# Patient Record
Sex: Female | Born: 1945 | Race: White | Hispanic: No | Marital: Married | Smoking: Current every day smoker
Health system: Southern US, Community
[De-identification: ages and names within clinical notes are randomized; demographics above are authoritative.]

## PROBLEM LIST (undated history)

## (undated) DIAGNOSIS — J449 Chronic obstructive pulmonary disease, unspecified: Secondary | ICD-10-CM

## (undated) DIAGNOSIS — I10 Essential (primary) hypertension: Secondary | ICD-10-CM

## (undated) DIAGNOSIS — G709 Myoneural disorder, unspecified: Secondary | ICD-10-CM

## (undated) DIAGNOSIS — F419 Anxiety disorder, unspecified: Secondary | ICD-10-CM

## (undated) DIAGNOSIS — I509 Heart failure, unspecified: Secondary | ICD-10-CM

## (undated) DIAGNOSIS — E119 Type 2 diabetes mellitus without complications: Secondary | ICD-10-CM

## (undated) DIAGNOSIS — M1990 Unspecified osteoarthritis, unspecified site: Secondary | ICD-10-CM

## (undated) DIAGNOSIS — N189 Chronic kidney disease, unspecified: Secondary | ICD-10-CM

## (undated) DIAGNOSIS — E785 Hyperlipidemia, unspecified: Secondary | ICD-10-CM

## (undated) HISTORY — DX: Anxiety disorder, unspecified: F41.9

## (undated) HISTORY — DX: Chronic obstructive pulmonary disease, unspecified: J44.9

## (undated) HISTORY — PX: ANKLE SURGERY: SHX546

## (undated) HISTORY — DX: Myoneural disorder, unspecified: G70.9

## (undated) HISTORY — DX: Chronic kidney disease, unspecified: N18.9

## (undated) HISTORY — DX: Essential (primary) hypertension: I10

## (undated) HISTORY — DX: Heart failure, unspecified: I50.9

## (undated) HISTORY — DX: Hyperlipidemia, unspecified: E78.5

## (undated) HISTORY — DX: Unspecified osteoarthritis, unspecified site: M19.90

## (undated) HISTORY — DX: Type 2 diabetes mellitus without complications: E11.9

---

## 2004-07-26 HISTORY — PX: JOINT REPLACEMENT: SHX530

## 2004-10-22 ENCOUNTER — Encounter: Payer: Self-pay | Admitting: Orthopedic Surgery

## 2004-10-24 ENCOUNTER — Encounter: Payer: Self-pay | Admitting: Orthopedic Surgery

## 2005-10-09 ENCOUNTER — Other Ambulatory Visit: Payer: Self-pay

## 2005-10-09 ENCOUNTER — Emergency Department: Payer: Self-pay | Admitting: Emergency Medicine

## 2005-10-15 ENCOUNTER — Emergency Department: Payer: Self-pay | Admitting: Emergency Medicine

## 2007-02-07 ENCOUNTER — Ambulatory Visit: Payer: Self-pay | Admitting: Internal Medicine

## 2007-02-07 DIAGNOSIS — Z8679 Personal history of other diseases of the circulatory system: Secondary | ICD-10-CM | POA: Insufficient documentation

## 2009-07-14 ENCOUNTER — Ambulatory Visit: Payer: Self-pay | Admitting: Unknown Physician Specialty

## 2009-08-01 ENCOUNTER — Ambulatory Visit: Payer: Self-pay | Admitting: Unknown Physician Specialty

## 2011-06-20 ENCOUNTER — Ambulatory Visit: Payer: Self-pay

## 2011-06-22 DIAGNOSIS — Z8619 Personal history of other infectious and parasitic diseases: Secondary | ICD-10-CM | POA: Insufficient documentation

## 2012-09-15 ENCOUNTER — Ambulatory Visit: Payer: Self-pay | Admitting: Family Medicine

## 2013-07-25 ENCOUNTER — Ambulatory Visit: Payer: Self-pay | Admitting: Ophthalmology

## 2013-11-15 ENCOUNTER — Ambulatory Visit: Payer: Self-pay | Admitting: Family Medicine

## 2014-11-14 LAB — HEPATIC FUNCTION PANEL
ALT: 10 U/L (ref 7–35)
AST: 16 U/L (ref 13–35)

## 2014-11-14 LAB — BASIC METABOLIC PANEL
BUN: 21 mg/dL (ref 4–21)
CREATININE: 17 mg/dL — AB (ref 0.5–1.1)
Glucose: 126 mg/dL
POTASSIUM: 4.4 mmol/L (ref 3.4–5.3)
SODIUM: 143 mmol/L (ref 137–147)

## 2014-11-14 LAB — HEMOGLOBIN A1C: Hgb A1c MFr Bld: 6.4 % — AB (ref 4.0–6.0)

## 2015-01-02 ENCOUNTER — Other Ambulatory Visit: Payer: Self-pay

## 2015-01-02 MED ORDER — PRAVASTATIN SODIUM 10 MG PO TABS: 10.0000 mg | ORAL_TABLET | Freq: Every day | ORAL | Status: DC

## 2015-01-02 NOTE — Telephone Encounter (Signed)
This was sent from [facility] as a refill request.

## 2015-01-10 ENCOUNTER — Encounter: Payer: Self-pay | Admitting: Family Medicine

## 2015-01-10 ENCOUNTER — Ambulatory Visit (INDEPENDENT_AMBULATORY_CARE_PROVIDER_SITE_OTHER): Payer: Medicare Other | Admitting: Family Medicine

## 2015-01-10 DIAGNOSIS — N183 Chronic kidney disease, stage 3 unspecified: Secondary | ICD-10-CM | POA: Insufficient documentation

## 2015-01-10 DIAGNOSIS — R748 Abnormal levels of other serum enzymes: Secondary | ICD-10-CM | POA: Insufficient documentation

## 2015-01-10 DIAGNOSIS — Z9181 History of falling: Secondary | ICD-10-CM | POA: Insufficient documentation

## 2015-01-10 DIAGNOSIS — E1142 Type 2 diabetes mellitus with diabetic polyneuropathy: Secondary | ICD-10-CM | POA: Insufficient documentation

## 2015-01-10 DIAGNOSIS — G8929 Other chronic pain: Secondary | ICD-10-CM | POA: Diagnosis not present

## 2015-01-10 DIAGNOSIS — J028 Acute pharyngitis due to other specified organisms: Secondary | ICD-10-CM

## 2015-01-10 DIAGNOSIS — M48061 Spinal stenosis, lumbar region without neurogenic claudication: Secondary | ICD-10-CM | POA: Insufficient documentation

## 2015-01-10 DIAGNOSIS — M25552 Pain in left hip: Secondary | ICD-10-CM

## 2015-01-10 DIAGNOSIS — F419 Anxiety disorder, unspecified: Secondary | ICD-10-CM | POA: Diagnosis not present

## 2015-01-10 DIAGNOSIS — M545 Low back pain, unspecified: Secondary | ICD-10-CM | POA: Insufficient documentation

## 2015-01-10 DIAGNOSIS — E669 Obesity, unspecified: Secondary | ICD-10-CM

## 2015-01-10 DIAGNOSIS — J029 Acute pharyngitis, unspecified: Secondary | ICD-10-CM

## 2015-01-10 DIAGNOSIS — R0982 Postnasal drip: Secondary | ICD-10-CM | POA: Insufficient documentation

## 2015-01-10 DIAGNOSIS — E1169 Type 2 diabetes mellitus with other specified complication: Secondary | ICD-10-CM | POA: Insufficient documentation

## 2015-01-10 DIAGNOSIS — I10 Essential (primary) hypertension: Secondary | ICD-10-CM | POA: Insufficient documentation

## 2015-01-10 DIAGNOSIS — M79606 Pain in leg, unspecified: Secondary | ICD-10-CM | POA: Insufficient documentation

## 2015-01-10 DIAGNOSIS — G894 Chronic pain syndrome: Secondary | ICD-10-CM | POA: Insufficient documentation

## 2015-01-10 DIAGNOSIS — F17200 Nicotine dependence, unspecified, uncomplicated: Secondary | ICD-10-CM | POA: Insufficient documentation

## 2015-01-10 DIAGNOSIS — M5410 Radiculopathy, site unspecified: Secondary | ICD-10-CM

## 2015-01-10 DIAGNOSIS — J449 Chronic obstructive pulmonary disease, unspecified: Secondary | ICD-10-CM | POA: Insufficient documentation

## 2015-01-10 DIAGNOSIS — M5416 Radiculopathy, lumbar region: Secondary | ICD-10-CM

## 2015-01-10 DIAGNOSIS — M25559 Pain in unspecified hip: Secondary | ICD-10-CM | POA: Insufficient documentation

## 2015-01-10 DIAGNOSIS — L84 Corns and callosities: Secondary | ICD-10-CM | POA: Insufficient documentation

## 2015-01-10 DIAGNOSIS — E119 Type 2 diabetes mellitus without complications: Secondary | ICD-10-CM | POA: Insufficient documentation

## 2015-01-10 DIAGNOSIS — B9689 Other specified bacterial agents as the cause of diseases classified elsewhere: Secondary | ICD-10-CM | POA: Insufficient documentation

## 2015-01-10 DIAGNOSIS — K219 Gastro-esophageal reflux disease without esophagitis: Secondary | ICD-10-CM | POA: Insufficient documentation

## 2015-01-10 DIAGNOSIS — G4700 Insomnia, unspecified: Secondary | ICD-10-CM | POA: Insufficient documentation

## 2015-01-10 DIAGNOSIS — F1120 Opioid dependence, uncomplicated: Secondary | ICD-10-CM | POA: Insufficient documentation

## 2015-01-10 MED ORDER — OXYCODONE-ACETAMINOPHEN 10-325 MG PO TABS: 1.0000 | ORAL_TABLET | Freq: Four times a day (QID) | ORAL | Status: DC | PRN

## 2015-01-10 MED ORDER — AMOXICILLIN-POT CLAVULANATE 875-125 MG PO TABS: 1.0000 | ORAL_TABLET | Freq: Two times a day (BID) | ORAL | Status: DC

## 2015-01-10 MED ORDER — BUSPIRONE HCL 7.5 MG PO TABS: 7.5000 mg | ORAL_TABLET | Freq: Two times a day (BID) | ORAL | Status: DC

## 2015-01-10 NOTE — Progress Notes (Signed)
Name: Emily Pratt   MRN: [id number]    DOB: 07/04/1946   Date:01/10/2015       Progress Note  Subjective  Chief Complaint  Chief Complaint  Patient presents with  . Pain  . Hoarse    Back Pain This is a chronic problem. The problem is unchanged. The pain is present in the gluteal and sacro-iliac. The quality of the pain is described as aching. The pain radiates to the left thigh. The pain is at a severity of 6/10. The pain is moderate. The symptoms are aggravated by bending. Associated symptoms include leg pain. Pertinent negatives include no bladder incontinence, bowel incontinence, fever, headaches, tingling or weakness. She has tried analgesics for the symptoms. The treatment provided significant relief.  Sore Throat  This is a new problem. The current episode started more than 1 month ago. The problem has been unchanged. The pain is worse on the right side. There has been no fever. The pain is moderate. Associated symptoms include ear pain, a hoarse voice and swollen glands. Pertinent negatives include no coughing, ear discharge, headaches or plugged ear sensation. She has had no exposure to strep or mono. She has tried cool liquids (chloraseptic spray ) for the symptoms. The treatment provided moderate relief.  Anxiety Presents for follow-up visit. Symptoms include excessive worry, insomnia and muscle tension. Symptoms occur most days. The severity of symptoms is moderate.   Past treatments include benzodiazephines Emily Pratt has tried lorazepam, alprazolam, and clonazepam). The treatment provided no relief. Compliance with prior treatments has been good.      Past Medical History  Diagnosis Date  . Anxiety   . Arthritis   . COPD (chronic obstructive pulmonary disease)   . CHF (congestive heart failure)   . Hyperlipidemia   . Hypertension   . Diabetes mellitus without complication   . Chronic kidney disease   . Neuromuscular disorder     Past Surgical History  Procedure  Laterality Date  . Joint replacement Bilateral 2006    Family History  Problem Relation Age of Onset  . Diabetes Mother   . Cancer Father   . Diabetes Sister   . Stroke Sister     History   Social History  . Marital Status: Married    Spouse Name: N/A  . Number of Children: N/A  . Years of Education: N/A   Occupational History  . Not on file.   Social History Main Topics  . Smoking status: Current Every Day Smoker    Types: Cigarettes  . Smokeless tobacco: Not on file  . Alcohol Use: No  . Drug Use: No  . Sexual Activity: Not on file   Other Topics Concern  . Not on file   Social History Narrative  . No narrative on file     Current outpatient prescriptions:  .  allopurinol (ZYLOPRIM) 300 MG tablet, , Disp: , Rfl:  .  amoxicillin-clavulanate (AUGMENTIN) 875-125 MG per tablet, Take 1 tablet by mouth 2 (two) times daily., Disp: 20 tablet, Rfl: 0 .  busPIRone (BUSPAR) 7.5 MG tablet, Take 1 tablet (7.5 mg total) by mouth 2 (two) times daily., Disp: 60 tablet, Rfl: 0 .  colchicine 0.6 MG tablet, , Disp: , Rfl:  .  furosemide (LASIX) 80 MG tablet, , Disp: , Rfl:  .  JANUVIA 50 MG tablet, , Disp: , Rfl:  .  LORazepam (ATIVAN) 1 MG tablet, , Disp: , Rfl:  .  losartan (COZAAR) 100 MG tablet, , Disp: ,  Rfl:  .  LYRICA 50 MG capsule, , Disp: , Rfl:  .  metoprolol (LOPRESSOR) 100 MG tablet, , Disp: , Rfl:  .  oxyCODONE-acetaminophen (PERCOCET) 10-325 MG per tablet, Take 1 tablet by mouth every 6 (six) hours as needed for pain., Disp: 120 tablet, Rfl: 0 .  pravastatin (PRAVACHOL) 10 MG tablet, Take 1 tablet (10 mg total) by mouth daily., Disp: 30 tablet, Rfl: 2 .  sertraline (ZOLOFT) 50 MG tablet, , Disp: , Rfl:  .  SPIRIVA HANDIHALER 18 MCG inhalation capsule, , Disp: , Rfl:   Allergies  Allergen Reactions  . Atorvastatin     Does not remember why she had Intolerance to Lipitor.  . Fentanyl     itching  . Rosuvastatin Anxiety    Chest tigtness and feeling as if she  had heartburn.     Review of Systems  Constitutional: Positive for malaise/fatigue. Negative for fever and chills.  HENT: Positive for ear pain, hoarse voice and sore throat. Negative for ear discharge.   Respiratory: Negative for cough.   Gastrointestinal: Negative for bowel incontinence.  Genitourinary: Negative for bladder incontinence.  Musculoskeletal: Positive for back pain.  Neurological: Negative for tingling, weakness and headaches.  Psychiatric/Behavioral: The patient has insomnia.       Objective  Filed Vitals:   01/10/15 1538  BP: 128/80  Pulse: 86  Temp: 98.3 F (36.8 C)  Resp: 16  Height: '5\' 3"'$  (1.6 m)  Weight: 236 lb 3 oz (107.134 kg)  SpO2: 88%    Physical Exam  Constitutional: She is well-developed, well-nourished, and in no distress.  HENT:  Head: Normocephalic and atraumatic.  Right Ear: Tympanic membrane normal. No drainage or swelling.  Left Ear: No drainage, swelling or tenderness.  Mouth/Throat: Posterior oropharyngeal erythema present.  Neck: Normal range of motion. Neck supple.  Cardiovascular: Normal rate and regular rhythm.   Pulmonary/Chest: Effort normal and breath sounds normal.  Musculoskeletal:       Lumbar back: She exhibits decreased range of motion, tenderness, pain and spasm.       Back:  Lymphadenopathy:       Right cervical: No superficial cervical adenopathy present.      Left cervical: No superficial cervical adenopathy present.  Nursing note and vitals reviewed.      Recent Results (from the past 2160 hour(s))  Basic metabolic panel     Status: Abnormal   Collection Time: 11/14/14 12:00 AM  Result Value Ref Range   Glucose 126 mg/dL   BUN 21 4 - 21 mg/dL   Creatinine 17.0 (A) 0.5 - 1.1 mg/dL   Potassium 4.4 3.4 - 5.3 mmol/L   Sodium 143 137 - 147 mmol/L  Hepatic function panel     Status: None   Collection Time: 11/14/14 12:00 AM  Result Value Ref Range   ALT 10 7 - 35 U/L   AST 16 13 - 35 U/L  Hemoglobin A1c      Status: Abnormal   Collection Time: 11/14/14 12:00 AM  Result Value Ref Range   Hgb A1c MFr Bld 6.4 (A) 4.0 - 6.0 %     Assessment & Plan 1. Chronic anxiety Patient has in the past failed therapy with lorazepam, clonazepam, and alprazolam. We will start her on buspirone 7.5 mg twice a day for anxiety. Patient will follow-up in one month. - busPIRone (BUSPAR) 7.5 MG tablet; Take 1 tablet (7.5 mg total) by mouth 2 (two) times daily.  Dispense: 60 tablet; Refill: 0  2. Chronic radicular low back pain Chronic low back pain and hip pain responsive to present opioid therapy. Patient is compliant with controlled substances agreement. She is taking the medication as directed. Refills provided. - oxyCODONE-acetaminophen (PERCOCET) 10-325 MG per tablet; Take 1 tablet by mouth every 6 (six) hours as needed for pain.  Dispense: 120 tablet; Refill: 0  3. Bacterial pharyngitis Assistant upper respiratory symptoms suggestive of bacterial pharyngitis. We will start patient on 10 day course of antibiotic therapy. Patient will follow up if not improving. - amoxicillin-clavulanate (AUGMENTIN) 875-125 MG per tablet; Take 1 tablet by mouth 2 (two) times daily.  Dispense: 20 tablet; Refill: 0   There are no diagnoses linked to this encounter.  Anais Koenen [facility] 01/10/2015 7:15 PM

## 2015-02-06 ENCOUNTER — Encounter: Payer: Self-pay | Admitting: Family Medicine

## 2015-02-06 ENCOUNTER — Ambulatory Visit (INDEPENDENT_AMBULATORY_CARE_PROVIDER_SITE_OTHER): Payer: Medicare Other | Admitting: Family Medicine

## 2015-02-06 VITALS — BP 120/70 | HR 85 | Temp 98.3°F | Resp 18 | Ht 63.0 in | Wt 240.6 lb

## 2015-02-06 DIAGNOSIS — G8929 Other chronic pain: Secondary | ICD-10-CM

## 2015-02-06 DIAGNOSIS — M1A0790 Idiopathic chronic gout, unspecified ankle and foot, without tophus (tophi): Secondary | ICD-10-CM | POA: Insufficient documentation

## 2015-02-06 DIAGNOSIS — M5410 Radiculopathy, site unspecified: Secondary | ICD-10-CM

## 2015-02-06 DIAGNOSIS — F419 Anxiety disorder, unspecified: Secondary | ICD-10-CM | POA: Diagnosis not present

## 2015-02-06 DIAGNOSIS — M1A4790 Other secondary chronic gout, unspecified ankle and foot, without tophus (tophi): Secondary | ICD-10-CM

## 2015-02-06 DIAGNOSIS — I10 Essential (primary) hypertension: Secondary | ICD-10-CM | POA: Diagnosis not present

## 2015-02-06 DIAGNOSIS — M5416 Radiculopathy, lumbar region: Secondary | ICD-10-CM

## 2015-02-06 MED ORDER — OXYCODONE-ACETAMINOPHEN 10-325 MG PO TABS: 1.0000 | ORAL_TABLET | Freq: Four times a day (QID) | ORAL | Status: DC | PRN

## 2015-02-06 MED ORDER — METOPROLOL TARTRATE 100 MG PO TABS: 100.0000 mg | ORAL_TABLET | Freq: Two times a day (BID) | ORAL | Status: DC

## 2015-02-06 MED ORDER — ALLOPURINOL 300 MG PO TABS: 300.0000 mg | ORAL_TABLET | Freq: Every day | ORAL | Status: DC

## 2015-02-06 MED ORDER — LOSARTAN POTASSIUM 100 MG PO TABS: 100.0000 mg | ORAL_TABLET | Freq: Every day | ORAL | Status: DC

## 2015-02-06 MED ORDER — BUSPIRONE HCL 10 MG PO TABS: 10.0000 mg | ORAL_TABLET | Freq: Two times a day (BID) | ORAL | Status: DC

## 2015-02-06 MED ORDER — COLCHICINE 0.6 MG PO TABS: 0.6000 mg | ORAL_TABLET | Freq: Every day | ORAL | Status: DC

## 2015-02-06 NOTE — Progress Notes (Signed)
Name: Emily Pratt   MRN: [id number]    DOB: 1946/05/09   Date:02/06/2015       Progress Note  Subjective  Chief Complaint  Chief Complaint  Patient presents with  . Follow-up    4wk  . Diabetes    Back Pain This is a chronic problem. The pain is present in the gluteal and lumbar spine. The pain is at a severity of 5/10. The pain is mild. Pertinent negatives include no chest pain, headaches, leg pain, numbness or paresis. She has tried analgesics for the symptoms.  Anxiety Presents for follow-up visit. Symptoms include insomnia, irritability (stressed from her husband's poor health) and nervous/anxious behavior. Patient reports no chest pain, depressed mood, excessive worry or palpitations. The severity of symptoms is moderate. The quality of sleep is poor.    Hypertension This is a chronic problem. The problem is controlled. Associated symptoms include anxiety. Pertinent negatives include no blurred vision, chest pain, headaches or palpitations. Past treatments include angiotensin blockers and beta blockers. There is no history of kidney disease, CAD/MI or CVA.      Past Medical History  Diagnosis Date  . Anxiety   . Arthritis   . COPD (chronic obstructive pulmonary disease)   . CHF (congestive heart failure)   . Hyperlipidemia   . Hypertension   . Diabetes mellitus without complication   . Chronic kidney disease   . Neuromuscular disorder     Past Surgical History  Procedure Laterality Date  . Joint replacement Bilateral 2006    Family History  Problem Relation Age of Onset  . Diabetes Mother   . Cancer Father   . Diabetes Sister   . Stroke Sister     History   Social History  . Marital Status: Married    Spouse Name: N/A  . Number of Children: N/A  . Years of Education: N/A   Occupational History  . Not on file.   Social History Main Topics  . Smoking status: Current Every Day Smoker    Types: Cigarettes  . Smokeless tobacco: Not on file  .  Alcohol Use: No  . Drug Use: No  . Sexual Activity: Not on file   Other Topics Concern  . Not on file   Social History Narrative     Current outpatient prescriptions:  .  allopurinol (ZYLOPRIM) 300 MG tablet, , Disp: , Rfl:  .  busPIRone (BUSPAR) 7.5 MG tablet, Take 1 tablet (7.5 mg total) by mouth 2 (two) times daily., Disp: 60 tablet, Rfl: 0 .  colchicine 0.6 MG tablet, , Disp: , Rfl:  .  furosemide (LASIX) 80 MG tablet, , Disp: , Rfl:  .  JANUVIA 50 MG tablet, , Disp: , Rfl:  .  LORazepam (ATIVAN) 1 MG tablet, , Disp: , Rfl:  .  losartan (COZAAR) 100 MG tablet, , Disp: , Rfl:  .  LYRICA 50 MG capsule, , Disp: , Rfl:  .  metoprolol (LOPRESSOR) 100 MG tablet, , Disp: , Rfl:  .  oxyCODONE-acetaminophen (PERCOCET) 10-325 MG per tablet, Take 1 tablet by mouth every 6 (six) hours as needed for pain., Disp: 120 tablet, Rfl: 0 .  pravastatin (PRAVACHOL) 10 MG tablet, Take 1 tablet (10 mg total) by mouth daily., Disp: 30 tablet, Rfl: 2 .  sertraline (ZOLOFT) 50 MG tablet, , Disp: , Rfl:  .  SPIRIVA HANDIHALER 18 MCG inhalation capsule, , Disp: , Rfl:  .  amoxicillin-clavulanate (AUGMENTIN) 875-125 MG per tablet, Take 1 tablet by  mouth 2 (two) times daily. (Patient not taking: Reported on 02/06/2015), Disp: 20 tablet, Rfl: 0  Allergies  Allergen Reactions  . Atorvastatin     Does not remember why she had Intolerance to Lipitor.  . Fentanyl     itching  . Rosuvastatin Anxiety    Chest tigtness and feeling as if she had heartburn.     Review of Systems  Constitutional: Positive for irritability (stressed from her husband's poor health).  Eyes: Negative for blurred vision.  Cardiovascular: Negative for chest pain and palpitations.  Musculoskeletal: Positive for back pain.  Neurological: Negative for numbness and headaches.  Psychiatric/Behavioral: Negative for depression. The patient is nervous/anxious and has insomnia.       Objective  Filed Vitals:   02/06/15 1400  BP:  120/70  Pulse: 85  Temp: 98.3 F (36.8 C)  TempSrc: Oral  Resp: 18  Height: '5\' 3"'$  (1.6 m)  Weight: 240 lb 9.6 oz (109.135 kg)  SpO2: 92%    Physical Exam  Constitutional: She is oriented to person, place, and time and well-developed, well-nourished, and in no distress.  Cardiovascular: Normal rate and regular rhythm.   Pulmonary/Chest: Effort normal and breath sounds normal.  Musculoskeletal:       Back:  Neurological: She is alert and oriented to person, place, and time.  Psychiatric: Memory, affect and judgment normal.  Nursing note and vitals reviewed.   Assessment & Plan 1. Essential hypertension  - metoprolol (LOPRESSOR) 100 MG tablet; Take 1 tablet (100 mg total) by mouth 2 (two) times daily.  Dispense: 180 tablet; Refill: 0 - losartan (COZAAR) 100 MG tablet; Take 1 tablet (100 mg total) by mouth daily.  Dispense: 90 tablet; Refill: 0  2. Chronic radicular low back pain Pain responsive to opioid therapy. Patient compliant with controlled substances agreement. She is aware of the dependence potential of opioids. Refills provided and follow-up in one month - oxyCODONE-acetaminophen (PERCOCET) 10-325 MG per tablet; Take 1 tablet by mouth every 6 (six) hours as needed for pain.  Dispense: 120 tablet; Refill: 0  3. Chronic anxiety Symptoms of anxiety are improved on BuSpar 7.5 g twice a day. We will increase the dosage to 10 mg twice a day and follow-up in one month - busPIRone (BUSPAR) 10 MG tablet; Take 1 tablet (10 mg total) by mouth 2 (two) times daily.  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  Dispense: 90 tablet; Refill: 0 - colchicine 0.6 MG tablet; Take 1 tablet (0.6 mg total) by mouth daily.  Dispense: 90 tablet; Refill: 0    Berwyn Bigley [facility] 02/06/2015 2:12 PM

## 2015-02-14 ENCOUNTER — Telehealth: Payer: Self-pay | Admitting: Family Medicine

## 2015-02-14 NOTE — Telephone Encounter (Signed)
Was given an antibiotic a few weeks back but isn't better. Patient is requesting a stronger antibiotic and also something for her throat. She can barely cough. [facility] to [email or web address]. [phone]

## 2015-02-17 NOTE — Telephone Encounter (Signed)
Routed encounter to Dr. Manuella Ghazi for Prescription

## 2015-02-17 NOTE — Telephone Encounter (Signed)
Called patient at the number listed in her chart and her mailbox is full with no option to leave any voice message. Please have the patient contact our office and I can speak with the patient regarding her symptoms.

## 2015-02-18 NOTE — Telephone Encounter (Signed)
Have made several attempts myself as well as Dr. Manuella Ghazi to reach patient no answer mailbox is full and we are unable to leave message. If patient calls back we will address issue and get a number where she can be reached.

## 2015-03-06 ENCOUNTER — Encounter: Payer: Self-pay | Admitting: Family Medicine

## 2015-03-06 ENCOUNTER — Ambulatory Visit (INDEPENDENT_AMBULATORY_CARE_PROVIDER_SITE_OTHER): Payer: Medicare Other | Admitting: Family Medicine

## 2015-03-06 VITALS — BP 118/70 | HR 98 | Temp 98.0°F | Resp 19 | Ht 63.0 in | Wt 231.5 lb

## 2015-03-06 DIAGNOSIS — M1A4790 Other secondary chronic gout, unspecified ankle and foot, without tophus (tophi): Secondary | ICD-10-CM | POA: Diagnosis not present

## 2015-03-06 DIAGNOSIS — I10 Essential (primary) hypertension: Secondary | ICD-10-CM | POA: Diagnosis not present

## 2015-03-06 DIAGNOSIS — G8929 Other chronic pain: Secondary | ICD-10-CM | POA: Diagnosis not present

## 2015-03-06 DIAGNOSIS — E1140 Type 2 diabetes mellitus with diabetic neuropathy, unspecified: Secondary | ICD-10-CM

## 2015-03-06 DIAGNOSIS — F419 Anxiety disorder, unspecified: Secondary | ICD-10-CM | POA: Diagnosis not present

## 2015-03-06 DIAGNOSIS — E785 Hyperlipidemia, unspecified: Secondary | ICD-10-CM

## 2015-03-06 DIAGNOSIS — J42 Unspecified chronic bronchitis: Secondary | ICD-10-CM | POA: Diagnosis not present

## 2015-03-06 DIAGNOSIS — M5416 Radiculopathy, lumbar region: Secondary | ICD-10-CM

## 2015-03-06 DIAGNOSIS — J449 Chronic obstructive pulmonary disease, unspecified: Secondary | ICD-10-CM | POA: Insufficient documentation

## 2015-03-06 DIAGNOSIS — M5410 Radiculopathy, site unspecified: Secondary | ICD-10-CM

## 2015-03-06 DIAGNOSIS — N183 Chronic kidney disease, stage 3 unspecified: Secondary | ICD-10-CM

## 2015-03-06 DIAGNOSIS — E1149 Type 2 diabetes mellitus with other diabetic neurological complication: Secondary | ICD-10-CM

## 2015-03-06 LAB — POCT GLYCOSYLATED HEMOGLOBIN (HGB A1C): Hemoglobin A1C: 7

## 2015-03-06 MED ORDER — ALLOPURINOL 300 MG PO TABS: 300.0000 mg | ORAL_TABLET | Freq: Every day | ORAL | Status: AC

## 2015-03-06 MED ORDER — PRAVASTATIN SODIUM 10 MG PO TABS: 10.0000 mg | ORAL_TABLET | Freq: Every day | ORAL | Status: DC

## 2015-03-06 MED ORDER — OXYCODONE-ACETAMINOPHEN 10-325 MG PO TABS: 1.0000 | ORAL_TABLET | Freq: Four times a day (QID) | ORAL | Status: DC | PRN

## 2015-03-06 MED ORDER — JANUVIA 50 MG PO TABS: 50.0000 mg | ORAL_TABLET | Freq: Every day | ORAL | Status: AC

## 2015-03-06 MED ORDER — METOPROLOL TARTRATE 100 MG PO TABS: 100.0000 mg | ORAL_TABLET | Freq: Two times a day (BID) | ORAL | Status: AC

## 2015-03-06 MED ORDER — LYRICA 50 MG PO CAPS: 50.0000 mg | ORAL_CAPSULE | Freq: Three times a day (TID) | ORAL | Status: AC

## 2015-03-06 MED ORDER — LOSARTAN POTASSIUM 100 MG PO TABS: 100.0000 mg | ORAL_TABLET | Freq: Every day | ORAL | Status: AC

## 2015-03-06 MED ORDER — COLCHICINE 0.6 MG PO TABS
0.6000 mg | ORAL_TABLET | Freq: Every day | ORAL | Status: AC
Start: 2015-03-06 — End: ?

## 2015-03-06 NOTE — Progress Notes (Signed)
Name: Emily Pratt   MRN: [id number]    DOB: Jun 17, 1946   Date:03/06/2015       Progress Note  Subjective  Chief Complaint  Chief Complaint  Patient presents with  . Follow-up    4 wk/fasting  . Diabetes  . Anxiety    Diabetes She presents for her follow-up diabetic visit. She has type 2 diabetes mellitus. Her disease course has been stable. Hypoglycemia symptoms include nervousness/anxiousness. Pertinent negatives for hypoglycemia include no headaches. Pertinent negatives for diabetes include no blurred vision, no chest pain, no polydipsia and no polyuria. Pertinent negatives for diabetic complications include no CVA. Current diabetic treatment includes oral agent (monotherapy) (Januvia '50mg'$  daily.). She is following a diabetic diet. Her breakfast blood glucose range is generally 110-130 mg/dl. An ACE inhibitor/angiotensin II receptor blocker is being taken.  Anxiety Presents for follow-up visit. Symptoms include irritability, malaise and nervous/anxious behavior. Patient reports no chest pain, palpitations, panic or shortness of breath. The severity of symptoms is moderate. The symptoms are aggravated by family issues. The quality of sleep is fair.   Past treatments include non-benzodiazephine anxiolytics. Compliance with prior treatments has been poor.  Hyperlipidemia This is a chronic problem. The problem is uncontrolled. Recent lipid tests were reviewed and are high. Exacerbating diseases include diabetes and obesity. Pertinent negatives include no chest pain, leg pain, myalgias or shortness of breath. Current antihyperlipidemic treatment includes statins.  Hypertension This is a chronic problem. The problem is controlled. Associated symptoms include anxiety. Pertinent negatives include no blurred vision, chest pain, headaches, palpitations or shortness of breath. Past treatments include beta blockers and angiotensin blockers. The current treatment provides moderate improvement. There is  no history of kidney disease, CAD/MI or CVA.  Back Pain This is a chronic problem. The pain is present in the gluteal and lumbar spine. The pain radiates to the left thigh. The pain is at a severity of 5/10. The symptoms are aggravated by standing. Pertinent negatives include no chest pain, headaches or leg pain. She has tried analgesics (Percocet 10-'325mg'$ ) for the symptoms.      Past Medical History  Diagnosis Date  . Anxiety   . Arthritis   . COPD (chronic obstructive pulmonary disease)   . CHF (congestive heart failure)   . Hyperlipidemia   . Hypertension   . Diabetes mellitus without complication   . Chronic kidney disease   . Neuromuscular disorder     Past Surgical History  Procedure Laterality Date  . Joint replacement Bilateral 2006    Family History  Problem Relation Age of Onset  . Diabetes Mother   . Cancer Father   . Diabetes Sister   . Stroke Sister     Social History   Social History  . Marital Status: Married    Spouse Name: N/A  . Number of Children: N/A  . Years of Education: N/A   Occupational History  . Not on file.   Social History Main Topics  . Smoking status: Current Every Day Smoker    Types: Cigarettes  . Smokeless tobacco: Not on file  . Alcohol Use: No  . Drug Use: No  . Sexual Activity: Not on file   Other Topics Concern  . Not on file   Social History Narrative     Current outpatient prescriptions:  .  allopurinol (ZYLOPRIM) 300 MG tablet, Take 1 tablet (300 mg total) by mouth daily., Disp: 90 tablet, Rfl: 0 .  busPIRone (BUSPAR) 10 MG tablet, Take 1 tablet (10  mg total) by mouth 2 (two) times daily., Disp: 60 tablet, Rfl: 0 .  colchicine 0.6 MG tablet, Take 1 tablet (0.6 mg total) by mouth daily., Disp: 90 tablet, Rfl: 0 .  furosemide (LASIX) 80 MG tablet, , Disp: , Rfl:  .  JANUVIA 50 MG tablet, Take 1 tablet (50 mg total) by mouth daily., Disp: 90 tablet, Rfl: 0 .  LORazepam (ATIVAN) 1 MG tablet, , Disp: , Rfl:  .   losartan (COZAAR) 100 MG tablet, Take 1 tablet (100 mg total) by mouth daily., Disp: 90 tablet, Rfl: 0 .  LYRICA 50 MG capsule, Take 1 capsule (50 mg total) by mouth 3 (three) times daily., Disp: 90 capsule, Rfl: 2 .  metoprolol (LOPRESSOR) 100 MG tablet, Take 1 tablet (100 mg total) by mouth 2 (two) times daily., Disp: 180 tablet, Rfl: 0 .  oxyCODONE-acetaminophen (PERCOCET) 10-325 MG per tablet, Take 1 tablet by mouth every 6 (six) hours as needed for pain., Disp: 120 tablet, Rfl: 0 .  pravastatin (PRAVACHOL) 10 MG tablet, Take 1 tablet (10 mg total) by mouth daily., Disp: 90 tablet, Rfl: 0 .  sertraline (ZOLOFT) 50 MG tablet, , Disp: , Rfl:   Allergies  Allergen Reactions  . Atorvastatin     Does not remember why she had Intolerance to Lipitor.  . Fentanyl     itching  . Rosuvastatin Anxiety    Chest tigtness and feeling as if she had heartburn.     Review of Systems  Constitutional: Positive for irritability.  Eyes: Negative for blurred vision.  Respiratory: Negative for shortness of breath.   Cardiovascular: Negative for chest pain and palpitations.  Musculoskeletal: Positive for back pain. Negative for myalgias.  Neurological: Negative for headaches.  Endo/Heme/Allergies: Negative for polydipsia.  Psychiatric/Behavioral: The patient is nervous/anxious.       Objective  Filed Vitals:   03/06/15 0905  BP: 118/70  Pulse: 98  Temp: 98 F (36.7 C)  TempSrc: Oral  Resp: 19  Height: '5\' 3"'$  (1.6 m)  Weight: 231 lb 8 oz (105.008 kg)  SpO2: 90%    Physical Exam  Constitutional: She is oriented to person, place, and time and well-developed, well-nourished, and in no distress.  Cardiovascular: Normal rate and regular rhythm.   Pulmonary/Chest: She has wheezes in the right lower field and the left lower field.  Musculoskeletal:       Lumbar back: She exhibits tenderness and pain. She exhibits no swelling.       Back:  Neurological: She is alert and oriented to person,  place, and time.  Skin: Skin is warm and dry.  Psychiatric: Memory, affect and judgment normal.  Nursing note and vitals reviewed.     Recent Results (from the past 2160 hour(s))  POCT HgB A1C     Status: Abnormal   Collection Time: 03/06/15  9:17 AM  Result Value Ref Range   Hemoglobin A1C 7.0      Assessment & Plan  1. DM (diabetes mellitus), type 2 with neurological complications D5H at goal at 7%. Patient to continue on present therapy - POCT HgB A1C - JANUVIA 50 MG tablet; Take 1 tablet (50 mg total) by mouth daily.  Dispense: 90 tablet; Refill: 0 - LYRICA 50 MG capsule; Take 1 capsule (50 mg total) by mouth 3 (three) times daily.  Dispense: 90 capsule; Refill: 2 - HgB A1c  2. Essential hypertension Blood Pressure at goal on present therapy. - losartan (COZAAR) 100 MG tablet; Take 1 tablet (  100 mg total) by mouth daily.  Dispense: 90 tablet; Refill: 0 - metoprolol (LOPRESSOR) 100 MG tablet; Take 1 tablet (100 mg total) by mouth 2 (two) times daily.  Dispense: 180 tablet; Refill: 0  3. Chronic bronchitis, unspecified chronic bronchitis type   4. Chronic kidney disease (CKD), stage III (moderate) Recheck kidney function.  5. Anxiety Patient has not taken buspirone because she felt dizzy after taking the medication. She has tried and failed clonazepam, and alprazolam therapy in the past. DC buspirone and reevaluate in one month.   6. 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  Dispense: 90 tablet; Refill: 0 - colchicine 0.6 MG tablet; Take 1 tablet (0.6 mg total) by mouth daily.  Dispense: 90 tablet; Refill: 0  7. Chronic radicular low back pain Lower back and left hip pain is stable on present opioid therapy. Patient is aware of the adverse reactions and interactions of opioids. She is compliant with the controlled substances agreement. Refills provided and follow-up in one  month. - oxyCODONE-acetaminophen (PERCOCET) 10-325 MG per tablet; Take 1 tablet by mouth every 6 (six) hours as needed for pain.  Dispense: 120 tablet; Refill: 0  8. Hyperlipidemia  - pravastatin (PRAVACHOL) 10 MG tablet; Take 1 tablet (10 mg total) by mouth daily.  Dispense: 90 tablet; Refill: 0 - Comprehensive metabolic panel - Lipid Profile    Menna Abeln [facility] 03/06/2015 5:55 PM

## 2015-03-18 ENCOUNTER — Telehealth: Payer: Self-pay | Admitting: Family Medicine

## 2015-03-18 NOTE — Telephone Encounter (Signed)
Pt is requesting refill on inhaler that has steroids included to [facility] [place]. Pt states the inhaler she is currently using is not working for her.

## 2015-03-21 NOTE — Telephone Encounter (Signed)
Reviewed medication list from [facility]. Patient has been on Ventolin, which is a short acting beta agonist, and Spiriva which is an anticholinergic in the past. She has not been on any inhaled corticosteroid in the past. Please schedule patient for an appointment for evaluation of symptoms and medication management as appropriate.

## 2015-03-21 NOTE — Telephone Encounter (Signed)
Routed to Dr. Manuella Ghazi for Steroid approval

## 2015-03-21 NOTE — Telephone Encounter (Signed)
Routed to front desk to schedule appointment

## 2015-04-03 ENCOUNTER — Encounter: Payer: Self-pay | Admitting: Family Medicine

## 2015-04-03 ENCOUNTER — Ambulatory Visit (INDEPENDENT_AMBULATORY_CARE_PROVIDER_SITE_OTHER): Payer: Medicare Other | Admitting: Family Medicine

## 2015-04-03 VITALS — BP 120/74 | HR 98 | Temp 98.2°F | Resp 18 | Ht 63.0 in | Wt 228.5 lb

## 2015-04-03 DIAGNOSIS — M5410 Radiculopathy, site unspecified: Secondary | ICD-10-CM | POA: Diagnosis not present

## 2015-04-03 DIAGNOSIS — M5416 Radiculopathy, lumbar region: Principal | ICD-10-CM

## 2015-04-03 DIAGNOSIS — G8929 Other chronic pain: Secondary | ICD-10-CM

## 2015-04-03 MED ORDER — OXYCODONE-ACETAMINOPHEN 10-325 MG PO TABS: 1.0000 | ORAL_TABLET | Freq: Four times a day (QID) | ORAL | Status: DC | PRN

## 2015-04-03 NOTE — Progress Notes (Signed)
Name: Emily Pratt   MRN: [id number]    DOB: 09/19/1945   Date:04/03/2015       Progress Note  Subjective  Chief Complaint  Chief Complaint  Patient presents with  . Follow-up    4 wk  . Hyperlipidemia  . COPD  . Anxiety  . Diabetes    HPI Pt. Is here for follow up and medication refill for chronic low back and left hip pain. She is on Oxycodone-Acetaminophen 10-325 mg every 6 hours as needed. Today, her pain is rated at 6/10. No side effects from Oxycodone reported.  Past Medical History  Diagnosis Date  . Anxiety   . Arthritis   . COPD (chronic obstructive pulmonary disease)   . CHF (congestive heart failure)   . Hyperlipidemia   . Hypertension   . Diabetes mellitus without complication   . Chronic kidney disease   . Neuromuscular disorder     Past Surgical History  Procedure Laterality Date  . Joint replacement Bilateral 2006    Family History  Problem Relation Age of Onset  . Diabetes Mother   . Cancer Father   . Diabetes Sister   . Stroke Sister     Social History   Social History  . Marital Status: Married    Spouse Name: N/A  . Number of Children: N/A  . Years of Education: N/A   Occupational History  . Not on file.   Social History Main Topics  . Smoking status: Current Every Day Smoker    Types: Cigarettes  . Smokeless tobacco: Not on file  . Alcohol Use: No  . Drug Use: No  . Sexual Activity: Not on file   Other Topics Concern  . Not on file   Social History Narrative     Current outpatient prescriptions:  .  allopurinol (ZYLOPRIM) 300 MG tablet, Take 1 tablet (300 mg total) by mouth daily., Disp: 90 tablet, Rfl: 0 .  colchicine 0.6 MG tablet, Take 1 tablet (0.6 mg total) by mouth daily., Disp: 90 tablet, Rfl: 0 .  furosemide (LASIX) 80 MG tablet, , Disp: , Rfl:  .  JANUVIA 50 MG tablet, Take 1 tablet (50 mg total) by mouth daily., Disp: 90 tablet, Rfl: 0 .  LORazepam (ATIVAN) 1 MG tablet, , Disp: , Rfl:  .  losartan (COZAAR) 100  MG tablet, Take 1 tablet (100 mg total) by mouth daily., Disp: 90 tablet, Rfl: 0 .  LYRICA 50 MG capsule, Take 1 capsule (50 mg total) by mouth 3 (three) times daily., Disp: 90 capsule, Rfl: 2 .  metoprolol (LOPRESSOR) 100 MG tablet, Take 1 tablet (100 mg total) by mouth 2 (two) times daily., Disp: 180 tablet, Rfl: 0 .  oxyCODONE-acetaminophen (PERCOCET) 10-325 MG per tablet, Take 1 tablet by mouth every 6 (six) hours as needed for pain., Disp: 120 tablet, Rfl: 0 .  VENTOLIN HFA 108 (90 BASE) MCG/ACT inhaler, , Disp: , Rfl:   Allergies  Allergen Reactions  . Atorvastatin     Does not remember why she had Intolerance to Lipitor.  . Fentanyl     itching  . Rosuvastatin Anxiety    Chest tigtness and feeling as if she had heartburn.     Review of Systems  Musculoskeletal: Positive for back pain and joint pain.   Objective  Filed Vitals:   04/03/15 1012  BP: 120/74  Pulse: 98  Temp: 98.2 F (36.8 C)  TempSrc: Oral  Resp: 18  Height: '5\' 3"'$  (1.6 m)  Weight: 228 lb 8 oz (103.647 kg)  SpO2: 90%    Physical Exam  Constitutional: She is oriented to person, place, and time and well-developed, well-nourished, and in no distress.  Cardiovascular: Normal rate and regular rhythm.   Pulmonary/Chest: Effort normal and breath sounds normal.  Musculoskeletal:       Left hip: She exhibits tenderness.       Legs: Neurological: She is alert and oriented to person, place, and time.  Nursing note and vitals reviewed.  Assessment & Plan  1. Chronic radicular low back pain Patient compliant with controlled substances agreement. She is aware of the dependence potential and the interactions of opioids. Refills provided and follow-up in one month - oxyCODONE-acetaminophen (PERCOCET) 10-325 MG per tablet; Take 1 tablet by mouth every 6 (six) hours as needed for pain.  Dispense: 120 tablet; Refill: 0   Talma Aguillard [facility] 04/03/2015 10:26  AM

## 2015-04-09 ENCOUNTER — Ambulatory Visit: Payer: Medicare Other | Admitting: Family Medicine

## 2015-04-10 ENCOUNTER — Encounter: Payer: Self-pay | Admitting: Family Medicine

## 2015-04-10 ENCOUNTER — Ambulatory Visit (INDEPENDENT_AMBULATORY_CARE_PROVIDER_SITE_OTHER): Payer: Medicare Other | Admitting: Family Medicine

## 2015-04-10 VITALS — BP 122/78 | HR 105 | Temp 98.3°F | Resp 18 | Ht 63.0 in | Wt 233.5 lb

## 2015-04-10 DIAGNOSIS — E119 Type 2 diabetes mellitus without complications: Secondary | ICD-10-CM

## 2015-04-10 DIAGNOSIS — Z780 Asymptomatic menopausal state: Secondary | ICD-10-CM | POA: Insufficient documentation

## 2015-04-10 DIAGNOSIS — R600 Localized edema: Secondary | ICD-10-CM | POA: Insufficient documentation

## 2015-04-10 DIAGNOSIS — J449 Chronic obstructive pulmonary disease, unspecified: Secondary | ICD-10-CM | POA: Diagnosis not present

## 2015-04-10 DIAGNOSIS — E559 Vitamin D deficiency, unspecified: Secondary | ICD-10-CM | POA: Insufficient documentation

## 2015-04-10 DIAGNOSIS — R6889 Other general symptoms and signs: Secondary | ICD-10-CM | POA: Insufficient documentation

## 2015-04-10 DIAGNOSIS — F329 Major depressive disorder, single episode, unspecified: Secondary | ICD-10-CM | POA: Insufficient documentation

## 2015-04-10 DIAGNOSIS — R609 Edema, unspecified: Secondary | ICD-10-CM | POA: Diagnosis not present

## 2015-04-10 DIAGNOSIS — Z79891 Long term (current) use of opiate analgesic: Secondary | ICD-10-CM | POA: Insufficient documentation

## 2015-04-10 DIAGNOSIS — I509 Heart failure, unspecified: Secondary | ICD-10-CM | POA: Insufficient documentation

## 2015-04-10 DIAGNOSIS — M5440 Lumbago with sciatica, unspecified side: Secondary | ICD-10-CM | POA: Insufficient documentation

## 2015-04-10 MED ORDER — MOMETASONE FURO-FORMOTEROL FUM 100-5 MCG/ACT IN AERO: 2.0000 | INHALATION_SPRAY | Freq: Two times a day (BID) | RESPIRATORY_TRACT | Status: DC

## 2015-04-10 MED ORDER — ALBUTEROL SULFATE (2.5 MG/3ML) 0.083% IN NEBU: 2.5000 mg | INHALATION_SOLUTION | Freq: Once | RESPIRATORY_TRACT | Status: AC

## 2015-04-10 MED ORDER — PREDNISONE 20 MG PO TABS: 20.0000 mg | ORAL_TABLET | Freq: Every day | ORAL | Status: DC

## 2015-04-10 MED ORDER — AMOXICILLIN-POT CLAVULANATE 875-125 MG PO TABS: 1.0000 | ORAL_TABLET | Freq: Two times a day (BID) | ORAL | Status: DC

## 2015-04-10 NOTE — Progress Notes (Signed)
Name: Emily Pratt   MRN: [id number]    DOB: [birth year]   Date:04/10/2015       Progress Note  Subjective  Chief Complaint  Chief Complaint  Patient presents with  . Breathing Problem    patient states feels like sinus is draining in chest making it hard for her to breathe; she has been taking mucinex since yesterday 04/09/2015 and has been taking every 4 hours  . Hyperlipidemia  . COPD  . Anxiety  . Diabetes    HPI  Acute exacerbation of COPD  Patient presents with a several-day history of exacerbation of COPD. There is increase sputum production which is purulent in color. There have been some chills associated and fevers as well. There is additional wheezing and cough. Usual medications are not working as effectively. Patient does recognize most improvement in treatment with oral antibiotic and accompanying oral steroids.   Hypoxemia  Patient has home O2. Her oximetry here in the office is only 80%  Allergic rhinitis  Allergic rhinitis has been worsening of recent with the pollen in the year. She has  Flonase at home. It using it with regularity.  Edema  Edema has been worse during the summer months. Her a slight breathing is as noted above. She is not having orthopnea or PND and there is no history of congestive heart failure. She is on Lyrica which may be triggering.    Diabetes  Patient presents for follow-up of diabetes which is present for over 5 years. Is currently on a regimen of manyJanuvia 50 mg daily . Patient states there is poor compliance with their diet and exercise. There's been no hypoglycemic episodes and there this is infrequent polyuria polydipsia polyphagia. His average fasting glucoses been in the low around low 100s . There is no end organ disease.  Last diabetic eye exam was unknown.   Last visit with dietitian was unknown. Last microalbumin was obtained unknownunknown .  Past Medical History  Diagnosis Date  . Anxiety   . Arthritis   . COPD  (chronic obstructive pulmonary disease)   . CHF (congestive heart failure)   . Hyperlipidemia   . Hypertension   . Diabetes mellitus without complication   . Chronic kidney disease   . Neuromuscular disorder     Social History  Substance Use Topics  . Smoking status: Current Every Day Smoker    Types: Cigarettes  . Smokeless tobacco: Not on file  . Alcohol Use: No     Current outpatient prescriptions:  .  allopurinol (ZYLOPRIM) 300 MG tablet, Take 1 tablet (300 mg total) by mouth daily., Disp: 90 tablet, Rfl: 0 .  colchicine 0.6 MG tablet, Take 1 tablet (0.6 mg total) by mouth daily., Disp: 90 tablet, Rfl: 0 .  furosemide (LASIX) 80 MG tablet, , Disp: , Rfl:  .  JANUVIA 50 MG tablet, Take 1 tablet (50 mg total) by mouth daily., Disp: 90 tablet, Rfl: 0 .  LORazepam (ATIVAN) 1 MG tablet, , Disp: , Rfl:  .  losartan (COZAAR) 100 MG tablet, Take 1 tablet (100 mg total) by mouth daily., Disp: 90 tablet, Rfl: 0 .  LYRICA 50 MG capsule, Take 1 capsule (50 mg total) by mouth 3 (three) times daily., Disp: 90 capsule, Rfl: 2 .  metoprolol (LOPRESSOR) 100 MG tablet, Take 1 tablet (100 mg total) by mouth 2 (two) times daily., Disp: 180 tablet, Rfl: 0 .  oxyCODONE-acetaminophen (PERCOCET) 10-325 MG per tablet, Take 1 tablet by mouth every 6 (  six) hours as needed for pain., Disp: 120 tablet, Rfl: 0 .  VENTOLIN HFA 108 (90 BASE) MCG/ACT inhaler, , Disp: , Rfl:  .  amoxicillin-clavulanate (AUGMENTIN) 875-125 MG per tablet, Take 1 tablet by mouth 2 (two) times daily., Disp: 20 tablet, Rfl: 0 .  mometasone-formoterol (DULERA) 100-5 MCG/ACT AERO, Inhale 2 puffs into the lungs 2 (two) times daily., Disp: 3 Inhaler, Rfl: 5 .  predniSONE (DELTASONE) 20 MG tablet, Take 1 tablet (20 mg total) by mouth daily with breakfast., Disp: 10 tablet, Rfl: 0  Current facility-administered medications:  .  albuterol (PROVENTIL) (2.5 MG/3ML) 0.083% nebulizer solution 2.5 mg, 2.5 mg, Nebulization, Once, Ashok Norris,  MD  Allergies  Allergen Reactions  . Atorvastatin     Does not remember why she had Intolerance to Lipitor.  . Fentanyl     itching  . Rosuvastatin Anxiety    Chest tigtness and feeling as if she had heartburn.    Review of Systems  Constitutional: Positive for chills. Negative for fever and weight loss.  HENT: Positive for congestion. Negative for hearing loss, sore throat and tinnitus.   Eyes: Negative for blurred vision, double vision and redness.  Respiratory: Positive for cough, sputum production, shortness of breath and wheezing. Negative for hemoptysis.   Cardiovascular: Positive for palpitations and leg swelling. Negative for chest pain, orthopnea and claudication.  Gastrointestinal: Negative for heartburn, nausea, vomiting, diarrhea, constipation and blood in stool.  Genitourinary: Positive for frequency. Negative for dysuria, urgency and hematuria.  Musculoskeletal: Positive for back pain and joint pain. Negative for myalgias, falls and neck pain.  Skin: Negative for itching.  Neurological: Positive for weakness. Negative for dizziness, tingling, tremors, focal weakness, seizures, loss of consciousness and headaches.  Endo/Heme/Allergies: Bruises/bleeds easily.  Psychiatric/Behavioral: Negative for depression and substance abuse. The patient is not nervous/anxious and does not have insomnia.        Objective  Filed Vitals:   04/10/15 0848  BP: 122/78  Pulse: 105  Temp: 98.3 F (36.8 C)  TempSrc: Oral  Resp: 18  Height: '5\' 3"'$  (1.6 m)  Weight: 233 lb 8 oz (105.915 kg)  SpO2: 80%     Physical Exam  Constitutional: She is oriented to person, place, and time and well-developed, well-nourished, and in no distress.  Morbidly obese and in no respiratory distress  HENT:  Head: Normocephalic.  Bilateral nasal congestion edema with erythema and clear discharge  Eyes: EOM are normal. Pupils are equal, round, and reactive to light.  Neck: Normal range of motion. No  thyromegaly present.  Cardiovascular: Normal rate, regular rhythm and normal heart sounds.   No murmur heard. Pulmonary/Chest:  Diminished breath sounds throughout with hyperresonance to percussion. There are scattered rhonchi and prolonged expiratory phase with rhonchi and wheezes particularly at the bases with diminished breath sounds more so on the left lower lobe.     Abdominal: Soft. Bowel sounds are normal.  Musculoskeletal: Normal range of motion. She exhibits edema.  Neurological: She is alert and oriented to person, place, and time. No cranial nerve deficit. Gait normal.  Skin: Skin is warm and dry. No rash noted.  Psychiatric: Memory and affect normal.  .diagmed    Assessment & Plan  1. Chronic obstructive pulmonary disease, unspecified COPD, unspecified chronic bronchitis type Acutely worsen - albuterol (PROVENTIL) (2.5 MG/3ML) 0.083% nebulizer solution 2.5 mg; Take 3 mLs (2.5 mg total) by nebulization once. - amoxicillin-clavulanate (AUGMENTIN) 875-125 MG per tablet; Take 1 tablet by mouth 2 (two) times daily.  Dispense: 20 tablet; Refill: 0 - predniSONE (DELTASONE) 20 MG tablet; Take 1 tablet (20 mg total) by mouth daily with breakfast.  Dispense: 10 tablet; Refill: 0 - mometasone-formoterol (DULERA) 100-5 MCG/ACT AERO; Inhale 2 puffs into the lungs 2 (two) times daily.  Dispense: 3 Inhaler; Refill: 5 - DG Chest 2 View; Future  2. Edema Baseline   3. Type 2 diabetes mellitus without complication Stable

## 2015-04-24 ENCOUNTER — Emergency Department: Payer: Medicare Other

## 2015-04-24 ENCOUNTER — Ambulatory Visit (INDEPENDENT_AMBULATORY_CARE_PROVIDER_SITE_OTHER): Payer: Medicare Other | Admitting: Family Medicine

## 2015-04-24 ENCOUNTER — Inpatient Hospital Stay
Admission: EM | Admit: 2015-04-24 | Discharge: 2015-04-25 | DRG: 190 | Disposition: A | Discharge status: Home / Self Care | Payer: Medicare Other | Attending: Internal Medicine | Admitting: Internal Medicine

## 2015-04-24 ENCOUNTER — Encounter: Payer: Self-pay | Admitting: Emergency Medicine

## 2015-04-24 ENCOUNTER — Encounter: Payer: Self-pay | Admitting: Family Medicine

## 2015-04-24 VITALS — BP 126/68 | HR 85 | Temp 98.6°F | Resp 16 | Ht 63.0 in | Wt 229.2 lb

## 2015-04-24 DIAGNOSIS — M25552 Pain in left hip: Secondary | ICD-10-CM | POA: Diagnosis present

## 2015-04-24 DIAGNOSIS — R0602 Shortness of breath: Secondary | ICD-10-CM | POA: Diagnosis not present

## 2015-04-24 DIAGNOSIS — I509 Heart failure, unspecified: Secondary | ICD-10-CM | POA: Diagnosis present

## 2015-04-24 DIAGNOSIS — Z888 Allergy status to other drugs, medicaments and biological substances status: Secondary | ICD-10-CM

## 2015-04-24 DIAGNOSIS — E1140 Type 2 diabetes mellitus with diabetic neuropathy, unspecified: Secondary | ICD-10-CM | POA: Diagnosis present

## 2015-04-24 DIAGNOSIS — J449 Chronic obstructive pulmonary disease, unspecified: Secondary | ICD-10-CM

## 2015-04-24 DIAGNOSIS — Z794 Long term (current) use of insulin: Secondary | ICD-10-CM

## 2015-04-24 DIAGNOSIS — Z885 Allergy status to narcotic agent status: Secondary | ICD-10-CM

## 2015-04-24 DIAGNOSIS — G8929 Other chronic pain: Secondary | ICD-10-CM | POA: Diagnosis present

## 2015-04-24 DIAGNOSIS — R0902 Hypoxemia: Secondary | ICD-10-CM | POA: Diagnosis present

## 2015-04-24 DIAGNOSIS — R9431 Abnormal electrocardiogram [ECG] [EKG]: Secondary | ICD-10-CM | POA: Diagnosis present

## 2015-04-24 DIAGNOSIS — M1990 Unspecified osteoarthritis, unspecified site: Secondary | ICD-10-CM | POA: Diagnosis present

## 2015-04-24 DIAGNOSIS — Z96653 Presence of artificial knee joint, bilateral: Secondary | ICD-10-CM | POA: Diagnosis present

## 2015-04-24 DIAGNOSIS — J441 Chronic obstructive pulmonary disease with (acute) exacerbation: Secondary | ICD-10-CM

## 2015-04-24 DIAGNOSIS — J42 Unspecified chronic bronchitis: Secondary | ICD-10-CM | POA: Diagnosis not present

## 2015-04-24 DIAGNOSIS — E1149 Type 2 diabetes mellitus with other diabetic neurological complication: Secondary | ICD-10-CM | POA: Diagnosis present

## 2015-04-24 DIAGNOSIS — M1A9XX0 Chronic gout, unspecified, without tophus (tophi): Secondary | ICD-10-CM | POA: Diagnosis present

## 2015-04-24 DIAGNOSIS — M545 Low back pain: Secondary | ICD-10-CM | POA: Diagnosis present

## 2015-04-24 DIAGNOSIS — E1122 Type 2 diabetes mellitus with diabetic chronic kidney disease: Secondary | ICD-10-CM | POA: Diagnosis present

## 2015-04-24 DIAGNOSIS — F17210 Nicotine dependence, cigarettes, uncomplicated: Secondary | ICD-10-CM | POA: Diagnosis present

## 2015-04-24 DIAGNOSIS — Z79891 Long term (current) use of opiate analgesic: Secondary | ICD-10-CM | POA: Diagnosis not present

## 2015-04-24 DIAGNOSIS — Z9981 Dependence on supplemental oxygen: Secondary | ICD-10-CM

## 2015-04-24 DIAGNOSIS — F419 Anxiety disorder, unspecified: Secondary | ICD-10-CM | POA: Diagnosis present

## 2015-04-24 DIAGNOSIS — Z79899 Other long term (current) drug therapy: Secondary | ICD-10-CM | POA: Diagnosis not present

## 2015-04-24 DIAGNOSIS — E785 Hyperlipidemia, unspecified: Secondary | ICD-10-CM | POA: Diagnosis present

## 2015-04-24 DIAGNOSIS — Z7952 Long term (current) use of systemic steroids: Secondary | ICD-10-CM

## 2015-04-24 DIAGNOSIS — E876 Hypokalemia: Secondary | ICD-10-CM | POA: Diagnosis present

## 2015-04-24 DIAGNOSIS — N183 Chronic kidney disease, stage 3 (moderate): Secondary | ICD-10-CM | POA: Diagnosis present

## 2015-04-24 DIAGNOSIS — J189 Pneumonia, unspecified organism: Secondary | ICD-10-CM | POA: Diagnosis present

## 2015-04-24 LAB — BASIC METABOLIC PANEL
ANION GAP: 9 (ref 5–15)
BUN: 17 mg/dL (ref 6–20)
CHLORIDE: 93 mmol/L — AB (ref 101–111)
CO2: 40 mmol/L — ABNORMAL HIGH (ref 22–32)
Calcium: 9.1 mg/dL (ref 8.9–10.3)
Creatinine, Ser: 1.11 mg/dL — ABNORMAL HIGH (ref 0.44–1.00)
GFR, EST AFRICAN AMERICAN: 58 mL/min — AB (ref >60)
GFR, EST NON AFRICAN AMERICAN: 50 mL/min — AB (ref >60)
Glucose, Bld: 136 mg/dL — ABNORMAL HIGH (ref 65–99)
POTASSIUM: 3.3 mmol/L — AB (ref 3.5–5.1)
SODIUM: 142 mmol/L (ref 135–145)

## 2015-04-24 LAB — CBC WITH DIFFERENTIAL/PLATELET
BASOS ABS: 0 10*3/uL (ref 0–0.1)
BASOS PCT: 1 %
EOS ABS: 0.2 10*3/uL (ref 0–0.7)
EOS PCT: 4 %
HCT: 48.2 % — ABNORMAL HIGH (ref 35.0–47.0)
HEMOGLOBIN: 15.7 g/dL (ref 12.0–16.0)
LYMPHS ABS: 1.1 10*3/uL (ref 1.0–3.6)
Lymphocytes Relative: 19 %
MCH: 29.9 pg (ref 26.0–34.0)
MCHC: 32.5 g/dL (ref 32.0–36.0)
MCV: 91.8 fL (ref 80.0–100.0)
Monocytes Absolute: 0.4 10*3/uL (ref 0.2–0.9)
Monocytes Relative: 7 %
NEUTROS PCT: 69 %
Neutro Abs: 4.1 10*3/uL (ref 1.4–6.5)
PLATELETS: 116 10*3/uL — AB (ref 150–440)
RBC: 5.25 MIL/uL — AB (ref 3.80–5.20)
RDW: 16.8 % — ABNORMAL HIGH (ref 11.5–14.5)
WBC: 5.9 10*3/uL (ref 3.6–11.0)

## 2015-04-24 LAB — TROPONIN I

## 2015-04-24 LAB — GLUCOSE, CAPILLARY: Glucose-Capillary: 190 mg/dL — ABNORMAL HIGH (ref 65–99)

## 2015-04-24 LAB — ACETAMINOPHEN LEVEL

## 2015-04-24 LAB — BRAIN NATRIURETIC PEPTIDE: B NATRIURETIC PEPTIDE 5: 59 pg/mL (ref 0.0–100.0)

## 2015-04-24 MED ORDER — METOPROLOL TARTRATE 100 MG PO TABS
100.0000 mg | ORAL_TABLET | Freq: Two times a day (BID) | ORAL | Status: DC
  Administered 2015-04-24 – 2015-04-25 (×2): 100 mg via ORAL
  Filled 2015-04-24 (×2): qty 1

## 2015-04-24 MED ORDER — DEXTROSE 5 % IV SOLN
1.0000 g | INTRAVENOUS | Status: DC
  Administered 2015-04-25: 1 g via INTRAVENOUS
  Filled 2015-04-24 (×2): qty 10

## 2015-04-24 MED ORDER — LORAZEPAM 1 MG PO TABS: 1.0000 mg | ORAL_TABLET | Freq: Four times a day (QID) | ORAL | Status: DC | PRN

## 2015-04-24 MED ORDER — DEXTROSE 5 % IV SOLN
500.0000 mg | INTRAVENOUS | Status: DC
  Administered 2015-04-25: 500 mg via INTRAVENOUS
  Filled 2015-04-24 (×2): qty 500

## 2015-04-24 MED ORDER — ACETAMINOPHEN 650 MG RE SUPP: 650.0000 mg | Freq: Four times a day (QID) | RECTAL | Status: DC | PRN

## 2015-04-24 MED ORDER — LEVOFLOXACIN IN D5W 750 MG/150ML IV SOLN
750.0000 mg | Freq: Once | INTRAVENOUS | Status: AC
  Administered 2015-04-24: 750 mg via INTRAVENOUS
  Filled 2015-04-24: qty 150

## 2015-04-24 MED ORDER — BUDESONIDE-FORMOTEROL FUMARATE 160-4.5 MCG/ACT IN AERO: 2.0000 | INHALATION_SPRAY | Freq: Two times a day (BID) | RESPIRATORY_TRACT | Status: AC

## 2015-04-24 MED ORDER — METHYLPREDNISOLONE SODIUM SUCC 40 MG IJ SOLR
40.0000 mg | Freq: Two times a day (BID) | INTRAMUSCULAR | Status: DC
  Administered 2015-04-24 – 2015-04-25 (×2): 40 mg via INTRAVENOUS
  Filled 2015-04-24 (×2): qty 1

## 2015-04-24 MED ORDER — INSULIN ASPART 100 UNIT/ML ~~LOC~~ SOLN: 0.0000 [IU] | Freq: Every day | SUBCUTANEOUS | Status: DC

## 2015-04-24 MED ORDER — IPRATROPIUM-ALBUTEROL 0.5-2.5 (3) MG/3ML IN SOLN
3.0000 mL | Freq: Once | RESPIRATORY_TRACT | Status: AC
  Administered 2015-04-24: 3 mL via RESPIRATORY_TRACT
  Filled 2015-04-24: qty 3

## 2015-04-24 MED ORDER — ONDANSETRON HCL 4 MG/2ML IJ SOLN: 4.0000 mg | Freq: Four times a day (QID) | INTRAMUSCULAR | Status: DC | PRN

## 2015-04-24 MED ORDER — LINAGLIPTIN 5 MG PO TABS
5.0000 mg | ORAL_TABLET | Freq: Every day | ORAL | Status: DC
  Administered 2015-04-24 – 2015-04-25 (×2): 5 mg via ORAL
  Filled 2015-04-24 (×2): qty 1

## 2015-04-24 MED ORDER — ONDANSETRON HCL 4 MG PO TABS: 4.0000 mg | ORAL_TABLET | Freq: Four times a day (QID) | ORAL | Status: DC | PRN

## 2015-04-24 MED ORDER — POLYETHYLENE GLYCOL 3350 17 G PO PACK: 17.0000 g | PACK | Freq: Every day | ORAL | Status: DC | PRN

## 2015-04-24 MED ORDER — SODIUM CHLORIDE 0.9 % IJ SOLN
3.0000 mL | Freq: Two times a day (BID) | INTRAMUSCULAR | Status: DC
  Administered 2015-04-24 – 2015-04-25 (×2): 3 mL via INTRAVENOUS

## 2015-04-24 MED ORDER — FUROSEMIDE 40 MG PO TABS
80.0000 mg | ORAL_TABLET | Freq: Every day | ORAL | Status: DC
  Administered 2015-04-24 – 2015-04-25 (×2): 80 mg via ORAL
  Filled 2015-04-24 (×2): qty 2

## 2015-04-24 MED ORDER — LOSARTAN POTASSIUM 50 MG PO TABS
100.0000 mg | ORAL_TABLET | Freq: Every day | ORAL | Status: DC
  Administered 2015-04-24 – 2015-04-25 (×2): 100 mg via ORAL
  Filled 2015-04-24 (×2): qty 2

## 2015-04-24 MED ORDER — BUDESONIDE-FORMOTEROL FUMARATE 160-4.5 MCG/ACT IN AERO
2.0000 | INHALATION_SPRAY | Freq: Two times a day (BID) | RESPIRATORY_TRACT | Status: DC
  Administered 2015-04-24 – 2015-04-25 (×2): 2 via RESPIRATORY_TRACT
  Filled 2015-04-24: qty 6

## 2015-04-24 MED ORDER — OXYCODONE-ACETAMINOPHEN 10-325 MG PO TABS: 1.0000 | ORAL_TABLET | Freq: Four times a day (QID) | ORAL | Status: DC | PRN

## 2015-04-24 MED ORDER — METHYLPREDNISOLONE SODIUM SUCC 125 MG IJ SOLR
125.0000 mg | Freq: Once | INTRAMUSCULAR | Status: AC
  Administered 2015-04-24: 125 mg via INTRAVENOUS
  Filled 2015-04-24: qty 2

## 2015-04-24 MED ORDER — INSULIN ASPART 100 UNIT/ML ~~LOC~~ SOLN
0.0000 [IU] | Freq: Three times a day (TID) | SUBCUTANEOUS | Status: DC
  Administered 2015-04-25: 3 [IU] via SUBCUTANEOUS
  Administered 2015-04-25: 5 [IU] via SUBCUTANEOUS
  Filled 2015-04-24: qty 5
  Filled 2015-04-24: qty 3

## 2015-04-24 MED ORDER — ACETAMINOPHEN 325 MG PO TABS: 650.0000 mg | ORAL_TABLET | Freq: Four times a day (QID) | ORAL | Status: DC | PRN

## 2015-04-24 MED ORDER — ALLOPURINOL 300 MG PO TABS
300.0000 mg | ORAL_TABLET | Freq: Every day | ORAL | Status: DC
  Administered 2015-04-24 – 2015-04-25 (×2): 300 mg via ORAL
  Filled 2015-04-24 (×2): qty 1

## 2015-04-24 MED ORDER — OXYCODONE HCL 5 MG PO TABS
5.0000 mg | ORAL_TABLET | Freq: Four times a day (QID) | ORAL | Status: DC | PRN
  Administered 2015-04-25: 5 mg via ORAL
  Filled 2015-04-24: qty 1

## 2015-04-24 MED ORDER — ALBUTEROL SULFATE (2.5 MG/3ML) 0.083% IN NEBU: 2.5000 mg | INHALATION_SOLUTION | Freq: Once | RESPIRATORY_TRACT | Status: AC

## 2015-04-24 MED ORDER — OXYCODONE-ACETAMINOPHEN 5-325 MG PO TABS: 1.0000 | ORAL_TABLET | Freq: Four times a day (QID) | ORAL | Status: DC | PRN

## 2015-04-24 MED ORDER — ENOXAPARIN SODIUM 40 MG/0.4ML ~~LOC~~ SOLN
40.0000 mg | Freq: Two times a day (BID) | SUBCUTANEOUS | Status: DC
Start: 2015-04-24 — End: 2015-04-25
  Administered 2015-04-24 – 2015-04-25 (×2): 40 mg via SUBCUTANEOUS
  Filled 2015-04-24 (×2): qty 0.4

## 2015-04-24 MED ORDER — IPRATROPIUM-ALBUTEROL 0.5-2.5 (3) MG/3ML IN SOLN
3.0000 mL | RESPIRATORY_TRACT | Status: DC
  Administered 2015-04-24 – 2015-04-25 (×5): 3 mL via RESPIRATORY_TRACT
  Filled 2015-04-24 (×5): qty 3

## 2015-04-24 MED ORDER — PREGABALIN 50 MG PO CAPS
50.0000 mg | ORAL_CAPSULE | Freq: Three times a day (TID) | ORAL | Status: DC
  Administered 2015-04-24 – 2015-04-25 (×2): 50 mg via ORAL
  Filled 2015-04-24 (×2): qty 1

## 2015-04-24 MED ORDER — POTASSIUM CHLORIDE CRYS ER 20 MEQ PO TBCR
40.0000 meq | EXTENDED_RELEASE_TABLET | Freq: Once | ORAL | Status: AC
  Administered 2015-04-24: 40 meq via ORAL
  Filled 2015-04-24: qty 2

## 2015-04-24 MED ORDER — COLCHICINE 0.6 MG PO TABS
0.6000 mg | ORAL_TABLET | Freq: Every day | ORAL | Status: DC
  Administered 2015-04-25 (×2): 0.6 mg via ORAL
  Filled 2015-04-24 (×2): qty 1

## 2015-04-24 NOTE — Progress Notes (Signed)
ANTICOAGULATION CONSULT NOTE - Initial Consult  Pharmacy Consult for Lovenox Indication: VTE prophylaxis  Allergies  Allergen Reactions  . Atorvastatin     Does not remember why she had Intolerance to Lipitor.  . Fentanyl     itching  . Rosuvastatin Anxiety    Chest tigtness and feeling as if she had heartburn.    Patient Measurements: Height: '5\' 3"'$  (160 cm) Weight: 230 lb 12.8 oz (104.69 kg) IBW/kg (Calculated) : 52.4 Heparin Dosing Weight:   Vital Signs: Temp: 98.4 F (36.9 C) (09/29 2053) Temp Source: Oral (09/29 2053) BP: 141/62 mmHg (09/29 2053) Pulse Rate: 95 (09/29 2053)  Labs:  Recent Labs  04/24/15 1722  HGB 15.7  HCT 48.2*  PLT 116*  CREATININE 1.11*  TROPONINI <0.03    Estimated Creatinine Clearance: 56.9 mL/min (by C-G formula based on Cr of 1.11).   Medical History: Past Medical History  Diagnosis Date  . Anxiety   . Arthritis   . COPD (chronic obstructive pulmonary disease)   . CHF (congestive heart failure)   . Hyperlipidemia   . Hypertension   . Diabetes mellitus without complication   . Chronic kidney disease   . Neuromuscular disorder     Medications:  Facility-administered medications prior to admission  Medication Dose Route Frequency Provider Last Rate Last Dose  . albuterol (PROVENTIL) (2.5 MG/3ML) 0.083% nebulizer solution 2.5 mg  2.5 mg Nebulization Once Ashok Norris, MD      . albuterol (PROVENTIL) (2.5 MG/3ML) 0.083% nebulizer solution 2.5 mg  2.5 mg Nebulization Once Roselee Nova, MD       Prescriptions prior to admission  Medication Sig Dispense Refill Last Dose  . allopurinol (ZYLOPRIM) 300 MG tablet Take 1 tablet (300 mg total) by mouth daily. 90 tablet 0 04/24/2015 at Unknown time  . budesonide-formoterol (SYMBICORT) 160-4.5 MCG/ACT inhaler Inhale 2 puffs into the lungs 2 (two) times daily. 1 Inhaler 3 04/24/2015 at Unknown time  . colchicine 0.6 MG tablet Take 1 tablet (0.6 mg total) by mouth daily. 90 tablet 0  04/24/2015 at Unknown time  . furosemide (LASIX) 80 MG tablet Take 80 mg by mouth daily.    04/24/2015 at Unknown time  . JANUVIA 50 MG tablet Take 1 tablet (50 mg total) by mouth daily. 90 tablet 0 04/24/2015 at Unknown time  . LORazepam (ATIVAN) 1 MG tablet Take 1 mg by mouth every 6 (six) hours as needed.    04/24/2015 at Unknown time  . losartan (COZAAR) 100 MG tablet Take 1 tablet (100 mg total) by mouth daily. 90 tablet 0 04/24/2015 at Unknown time  . LYRICA 50 MG capsule Take 1 capsule (50 mg total) by mouth 3 (three) times daily. 90 capsule 2 04/24/2015 at Unknown time  . metoprolol (LOPRESSOR) 100 MG tablet Take 1 tablet (100 mg total) by mouth 2 (two) times daily. 180 tablet 0 04/24/2015 at Unknown time  . oxyCODONE-acetaminophen (PERCOCET) 10-325 MG per tablet Take 1 tablet by mouth every 6 (six) hours as needed for pain. 120 tablet 0 04/24/2015 at Unknown time  . VENTOLIN HFA 108 (90 BASE) MCG/ACT inhaler Inhale 1 puff into the lungs every 4 (four) hours as needed.    prn at prn    Assessment: BMI = 41 CrCl = 56.9 ml/min  Goal of Therapy:  DVT prophylaxis   Plan:  Lovenox 40 mg SQ Q24H originally ordered.  Will adjust dose to lovenox 40 mg SQ Q12H based on BMI > 40 .  Tyri Elmore D 04/24/2015,8:57 PM

## 2015-04-24 NOTE — H&P (Signed)
[facility] NAME: Emily Pratt    MR#:  [id number]  DATE OF BIRTH:  08-Nov-1945  DATE OF ADMISSION:  04/24/2015  PRIMARY CARE PHYSICIAN: Keith Rake, MD   REQUESTING/REFERRING PHYSICIAN: Dr. Inez Catalina  CHIEF COMPLAINT:   Chief Complaint  Patient presents with  . Shortness of Breath    HISTORY OF PRESENT ILLNESS:  Emily Pratt  is a 69 y.o. female with a known history of COPD, on home oxygen 2 L nocturnal, congestive heart failure, hypertension diabetes ongoing smoking and CKD presents to the hospital secondary to worsening shortness of breath. Patient states her symptoms have been going on for almost 4 weeks now. Her breathing has been lately getting worse. She's been having more congestion postnasal drip that is leading her to have productive cough. She went to see her PCP today and was noted to be hypoxic there and was sent over to the emergency room. Here on exam she is noted to have some scattered wheezing but chest x-ray showing right-sided pneumonia. Patient denies any chest pain, she has had some chills at home no fevers. No nausea or vomiting reported.  PAST MEDICAL HISTORY:   Past Medical History  Diagnosis Date  . Anxiety   . Arthritis   . COPD (chronic obstructive pulmonary disease)   . CHF (congestive heart failure)   . Hyperlipidemia   . Hypertension   . Diabetes mellitus without complication   . Chronic kidney disease   . Neuromuscular disorder     PAST SURGICAL HISTORY:   Past Surgical History  Procedure Laterality Date  . Joint replacement Bilateral 2006    both knees  . Ankle surgery      SOCIAL HISTORY:   Social History  Substance Use Topics  . Smoking status: Current Every Day Smoker    Types: Cigarettes  . Smokeless tobacco: Not on file     Comment: 0.5 pack /day  . Alcohol Use: No    FAMILY HISTORY:   Family History  Problem Relation Age of Onset  . Diabetes Mother   . Cancer  Father   . Diabetes Sister   . Stroke Sister     DRUG ALLERGIES:   Allergies  Allergen Reactions  . Atorvastatin     Does not remember why she had Intolerance to Lipitor.  . Fentanyl     itching  . Rosuvastatin Anxiety    Chest tigtness and feeling as if she had heartburn.    REVIEW OF SYSTEMS:   Review of Systems  Constitutional: Positive for fever and malaise/fatigue. Negative for chills and weight loss.  HENT: Positive for congestion and sore throat. Negative for ear discharge, ear pain, hearing loss, nosebleeds and tinnitus.   Eyes: Negative for blurred vision, double vision and photophobia.  Respiratory: Positive for cough, sputum production, shortness of breath and wheezing. Negative for hemoptysis.   Cardiovascular: Positive for orthopnea and leg swelling. Negative for chest pain and palpitations.  Gastrointestinal: Negative for heartburn, nausea, vomiting, abdominal pain, diarrhea, constipation and melena.  Genitourinary: Negative for dysuria, urgency, frequency and hematuria.  Musculoskeletal: Positive for myalgias and joint pain. Negative for back pain and neck pain.  Skin: Negative for rash.  Neurological: Negative for dizziness, tingling, tremors, sensory change, speech change, focal weakness and headaches.  Endo/Heme/Allergies: Does not bruise/bleed easily.  Psychiatric/Behavioral: Negative for depression. The patient is nervous/anxious.     MEDICATIONS AT HOME:   Prior to Admission medications  Medication Sig Start Date End Date Taking? Authorizing Provider  allopurinol (ZYLOPRIM) 300 MG tablet Take 1 tablet (300 mg total) by mouth daily. 03/06/15  Yes Roselee Nova, MD  budesonide-formoterol [facility]) 160-4.5 MCG/ACT inhaler Inhale 2 puffs into the lungs 2 (two) times daily. 04/24/15  Yes Roselee Nova, MD  colchicine 0.6 MG tablet Take 1 tablet (0.6 mg total) by mouth daily. 03/06/15  Yes Roselee Nova, MD  furosemide (LASIX) 80 MG tablet Take 80 mg  by mouth daily.  11/06/14  Yes Historical Provider, MD  JANUVIA 50 MG tablet Take 1 tablet (50 mg total) by mouth daily. 03/06/15  Yes Roselee Nova, MD  LORazepam (ATIVAN) 1 MG tablet Take 1 mg by mouth every 6 (six) hours as needed.  11/14/14  Yes Historical Provider, MD  losartan (COZAAR) 100 MG tablet Take 1 tablet (100 mg total) by mouth daily. 03/06/15  Yes Roselee Nova, MD  LYRICA 50 MG capsule Take 1 capsule (50 mg total) by mouth 3 (three) times daily. 03/06/15  Yes Roselee Nova, MD  metoprolol (LOPRESSOR) 100 MG tablet Take 1 tablet (100 mg total) by mouth 2 (two) times daily. 03/06/15  Yes Roselee Nova, MD  oxyCODONE-acetaminophen (PERCOCET) 10-325 MG per tablet Take 1 tablet by mouth every 6 (six) hours as needed for pain. 04/03/15  Yes Roselee Nova, MD  VENTOLIN HFA 108 (90 BASE) MCG/ACT inhaler Inhale 1 puff into the lungs every 4 (four) hours as needed.  03/25/15  Yes Historical Provider, MD      VITAL SIGNS:  Blood pressure 105/58, pulse 90, temperature 98.2 F (36.8 C), temperature source Oral, resp. rate 20, height '5\' 3"'$  (1.6 m), weight 105.688 kg (233 lb), SpO2 94 %.  PHYSICAL EXAMINATION:   Physical Exam  GENERAL:  69 y.o.-year-old patient sitting in the bed with mild respiratory distress.  EYES: Pupils equal, round, reactive to light and accommodation. No scleral icterus. Extraocular muscles intact. Telangiectasia noted on the left upper eyelid HEENT: Head atraumatic, normocephalic. Oropharynx and nasopharynx clear.  NECK:  Supple, no jugular venous distention. No thyroid enlargement, no tenderness.  LUNGS: Coarse breath sounds posteriorly especially right base, decreased breath sounds left lung Ace. No significant wheezing noted. No crackles noted. No use of accessory muscles for breathing CARDIOVASCULAR: S1, S2 normal. No murmurs, rubs, or gallops.  ABDOMEN: Soft, nontender, nondistended. Bowel sounds present. No organomegaly or mass.  EXTREMITIES: 2+ pedal  edema, no cyanosis, or clubbing.  NEUROLOGIC: Cranial nerves II through XII are intact. Muscle strength 5/5 in all extremities. Sensation intact. Gait not checked.  PSYCHIATRIC: The patient is alert and oriented x 3.  SKIN: No obvious rash, lesion, or ulcer.   LABORATORY PANEL:   CBC  Recent Labs Lab 04/24/15 1722  WBC 5.9  HGB 15.7  HCT 48.2*  PLT 116*   ------------------------------------------------------------------------------------------------------------------  Chemistries   Recent Labs Lab 04/24/15 1722  NA 142  K 3.3*  CL 93*  CO2 40*  GLUCOSE 136*  BUN 17  CREATININE 1.11*  CALCIUM 9.1   ------------------------------------------------------------------------------------------------------------------  Cardiac Enzymes  Recent Labs Lab 04/24/15 1722  TROPONINI <0.03   ------------------------------------------------------------------------------------------------------------------  RADIOLOGY:  Dg Chest Portable 1 View  04/24/2015   CLINICAL DATA:  Return productive cough for, shortness of breath for the last 2 weeks.  EXAM: PORTABLE CHEST 1 VIEW  COMPARISON:  July 14, 2009  FINDINGS: The mediastinal contour is stable. The heart  size is enlarged. There is right upper lobe pneumonia. There is also consolidation of right lung base, pneumonia is not excluded. The left lung is clear. The bony structures are stable.  IMPRESSION: Right upper lobe pneumonia. In addition, there is consolidation right lung base, pneumonia is not excluded.   Electronically Signed   By: Abelardo Diesel M.D.   On: 04/24/2015 18:02    EKG:   Orders placed or performed during the hospital encounter of 04/24/15  . EKG 12-Lead  . EKG 12-Lead    IMPRESSION AND PLAN:   Emily Pratt  is a 69 y.o. female with a known history of COPD, on home oxygen 2 L nocturnal, congestive heart failure, hypertension diabetes ongoing smoking and CKD presents to the hospital secondary to worsening  shortness of breath.  #1 acute on chronic COPD exacerbation and right upper lobe pneumonia- -Blood cultures have been ordered. Started on Rocephin and azithromycin. -Sputum cultures ordered. -Oxygen support as needed. -DuoNeb scheduled, Solu-Medrol IV twice a day at this time.  #2 congestive heart failure-unknown ejection fraction. -Continue Lasix at this time. No evidence of acute exacerbation noted.  #3 hypertension-continue losartan and metoprolol  #4 diabetes mellitus-patient on Januvia at home-changed over to [place] here in the hospital and also sliding scale insulin. Check A1c  #5 DVT prophylaxis-Lovenox  #6 hypokalemia-will be replaced   All the records are reviewed and case discussed with ED provider. Management plans discussed with the patient, family and they are in agreement.  CODE STATUS: Full code  TOTAL TIME TAKING CARE OF THIS PATIENT: 50 minutes.    Gladstone Lighter M.D on 04/24/2015 at 6:52 PM  Between 7am to 6pm - Pager - [phone]  After 6pm go to [email or web address] - password [email or web address] [facility]  [facility]  Office  [phone]  CC: Primary care physician; Keith Rake, MD

## 2015-04-24 NOTE — ED Notes (Signed)
Pt was sent over from [facility] with low oxygen saturation.

## 2015-04-24 NOTE — ED Provider Notes (Signed)
[facility] Emergency Department Shahidah Nesbitt Note    ____________________________________________  Time seen: 1710  I have reviewed the triage vital signs and the nursing notes.   HISTORY  Chief Complaint Shortness of Breath   History limited by: Not Limited   HPI Emily Pratt is a 69 y.o. female with history of COPD who is sent to the emergency department today from [facility] because of breathing difficulties. The patient states that for the past month she has been having increasing difficulty with her breathing. She states that she has been seen at [facility] multiple times and has been given prescription for steroids however is currently not on any. She denies any chest pain with her shortness of breath. She is on oxygen at home but only at night however she states she has been using it during the day. She denies missing any of her inhaler medications. She denies any fevers.  Past Medical History  Diagnosis Date  . Anxiety   . Arthritis   . COPD (chronic obstructive pulmonary disease)   . CHF (congestive heart failure)   . Hyperlipidemia   . Hypertension   . Diabetes mellitus without complication   . Chronic kidney disease   . Neuromuscular disorder     Patient Active Problem List   Diagnosis Date Noted  . CCF (congestive cardiac failure) 04/10/2015  . Long term current use of opiate analgesic 04/10/2015  . Affective disorder, major 04/10/2015  . Low back pain with sciatica 04/10/2015  . Menopause 04/10/2015  . Edema, peripheral 04/10/2015  . Mechanical and motor problems with internal organs 04/10/2015  . Avitaminosis D 04/10/2015  . COPD (chronic obstructive pulmonary disease) 03/06/2015  . Hyperlipidemia 03/06/2015  . Chronic gout of foot 02/06/2015  . Current smoker 01/10/2015  . At risk for falling 01/10/2015  . Callus of foot 01/10/2015  . Anxiety 01/10/2015  . Arthralgia of hip 01/10/2015  . Leg pain 01/10/2015  . Chronic LBP  01/10/2015  . Chronic pain associated with significant psychosocial dysfunction 01/10/2015  . Lumbar radiculopathy 01/10/2015  . Continuous opioid dependence 01/10/2015  . Chronic kidney disease (CKD), stage III (moderate) 01/10/2015  . CAFL (chronic airflow limitation) 01/10/2015  . Diabetes mellitus type 2 in obese 01/10/2015  . Diabetes 01/10/2015  . Diabetic peripheral neuropathy 01/10/2015  . Diabetes mellitus, type 2 01/10/2015  . Abnormal serum level of alkaline phosphatase 01/10/2015  . Acid reflux 01/10/2015  . Cannot sleep 01/10/2015  . BP (high blood pressure) 01/10/2015  . PND (post-nasal drip) 01/10/2015  . Lumbar canal stenosis 01/10/2015  . Chronic left hip pain 01/10/2015  . Bacterial pharyngitis 01/10/2015  . H/O viral illness 06/22/2011  . H/O atrial flutter 02/07/2007  . Hypoxia 10/28/2005    Past Surgical History  Procedure Laterality Date  . Joint replacement Bilateral 2006    Current Outpatient Rx  Name  Route  Sig  Dispense  Refill  . allopurinol (ZYLOPRIM) 300 MG tablet   Oral   Take 1 tablet (300 mg total) by mouth daily.   90 tablet   0   . budesonide-formoterol (SYMBICORT) 160-4.5 MCG/ACT inhaler   Inhalation   Inhale 2 puffs into the lungs 2 (two) times daily.   1 Inhaler   3   . colchicine 0.6 MG tablet   Oral   Take 1 tablet (0.6 mg total) by mouth daily.   90 tablet   0   . furosemide (LASIX) 80 MG tablet               .  JANUVIA 50 MG tablet   Oral   Take 1 tablet (50 mg total) by mouth daily.   90 tablet   0     Dispense as written.   Marland Kitchen LORazepam (ATIVAN) 1 MG tablet               . losartan (COZAAR) 100 MG tablet   Oral   Take 1 tablet (100 mg total) by mouth daily.   90 tablet   0   . LYRICA 50 MG capsule   Oral   Take 1 capsule (50 mg total) by mouth 3 (three) times daily.   90 capsule   2     Dispense as written.   . metoprolol (LOPRESSOR) 100 MG tablet   Oral   Take 1 tablet (100 mg total) by  mouth 2 (two) times daily.   180 tablet   0   . oxyCODONE-acetaminophen (PERCOCET) 10-325 MG per tablet   Oral   Take 1 tablet by mouth every 6 (six) hours as needed for pain.   120 tablet   0   . VENTOLIN HFA 108 (90 BASE) MCG/ACT inhaler                 Dispense as written.     Allergies Atorvastatin; Fentanyl; and Rosuvastatin  Family History  Problem Relation Age of Onset  . Diabetes Mother   . Cancer Father   . Diabetes Sister   . Stroke Sister     Social History Social History  Substance Use Topics  . Smoking status: Current Every Day Smoker    Types: Cigarettes  . Smokeless tobacco: None  . Alcohol Use: No    Review of Systems  Constitutional: Negative for fever. Cardiovascular: Negative for chest pain. Respiratory: Positive for shortness of breath. Gastrointestinal: Negative for abdominal pain, vomiting and diarrhea. Genitourinary: Negative for dysuria. Musculoskeletal: Negative for back pain. Skin: Negative for rash. Neurological: Negative for headaches, focal weakness or numbness.  10-point ROS otherwise negative.  ____________________________________________   PHYSICAL EXAM:  VITAL SIGNS: ED Triage Vitals  Enc Vitals Group     BP 04/24/15 1701 132/74 mmHg     Pulse Rate 04/24/15 1701 88     Resp 04/24/15 1701 22     Temp 04/24/15 1702 98.2 F (36.8 C)     Temp Source 04/24/15 1701 Oral     SpO2 04/24/15 1701 86 %     Weight 04/24/15 1701 233 lb (105.688 kg)     Height 04/24/15 1701 '5\' 3"'$  (1.6 m)   Constitutional: Alert and oriented. Well appearing and in no distress. Eyes: Conjunctivae are normal. PERRL. Normal extraocular movements. ENT   Head: Normocephalic and atraumatic.   Nose: No congestion/rhinnorhea.   Mouth/Throat: Mucous membranes are moist.   Neck: No stridor. Hematological/Lymphatic/Immunilogical: No cervical lymphadenopathy. Cardiovascular: Normal rate, regular rhythm.  No murmurs, rubs, or  gallops. Respiratory: Increased work of breathing. Bilateral wheezing. Gastrointestinal: Soft and nontender. No distention.  Genitourinary: Deferred Musculoskeletal: Normal range of motion in all extremities. No joint effusions.  1+ bilateral lower extremity edema Neurologic:  Normal speech and language. No gross focal neurologic deficits are appreciated. Speech is normal.  Skin:  Skin is warm, dry and intact. No rash noted. Psychiatric: Mood and affect are normal. Speech and behavior are normal. Patient exhibits appropriate insight and judgment.  ____________________________________________    LABS (pertinent positives/negatives)  Labs Reviewed  CBC WITH DIFFERENTIAL/PLATELET - Abnormal; Notable for the following:    RBC 5.25 (*)  HCT 48.2 (*)    RDW 16.8 (*)    Platelets 116 (*)    All other components within normal limits  BASIC METABOLIC PANEL - Abnormal; Notable for the following:    Potassium 3.3 (*)    Chloride 93 (*)    CO2 40 (*)    Glucose, Bld 136 (*)    Creatinine, Ser 1.11 (*)    GFR calc non Af Amer 50 (*)    GFR calc Af Amer 58 (*)    All other components within normal limits  ACETAMINOPHEN LEVEL - Abnormal; Notable for the following:    Acetaminophen (Tylenol), Serum <10 (*)    All other components within normal limits  GLUCOSE, CAPILLARY - Abnormal; Notable for the following:    Glucose-Capillary 190 (*)    All other components within normal limits  CULTURE, BLOOD (ROUTINE X 2)  CULTURE, BLOOD (ROUTINE X 2)  CULTURE, EXPECTORATED SPUTUM-ASSESSMENT  TROPONIN I  BRAIN NATRIURETIC PEPTIDE  HEMOGLOBIN U4Q  BASIC METABOLIC PANEL  CBC     ____________________________________________   EKG  I, Nance Pear, attending physician, personally viewed and interpreted this EKG  EKG Time: 1715 Rate: 90 Rhythm: Normal sinus rhythm Axis: Left axis deviation Intervals: qtc 479 QRS: Left bundle branch block ST changes: No ST elevation Impression:  Abnormal EKG   ____________________________________________    RADIOLOGY  CXR  IMPRESSION: Right upper lobe pneumonia. In addition, there is consolidation right lung base, pneumonia is not excluded.  I, GOODMAN, GRAYDON, personally viewed and evaluated these images (plain radiographs) as part of my medical decision making. ____________________________________________   PROCEDURES  Procedure(s) performed: None  Critical Care performed: No  ____________________________________________   INITIAL IMPRESSION / ASSESSMENT AND PLAN / ED COURSE  Pertinent labs & imaging results that were available during my care of the patient were reviewed by me and considered in my medical decision making (see chart for details).  Patient presented to the emergency department today with concerns for low oxygenation. Patient states that she has been having problems with her breathing for roughly 1 month. X-ray does show pneumonia. Will place patient on IV antibiotics. Will plan on admission to the hospital. Patient did feel better after DuoNeb treatment.  ____________________________________________   FINAL CLINICAL IMPRESSION(S) / ED DIAGNOSES  Final diagnoses:  Community acquired pneumonia  Chronic obstructive pulmonary disease, unspecified COPD, unspecified chronic bronchitis type  Shortness of breath  Hypoxia     Nance Pear, MD 04/24/15 2340

## 2015-04-24 NOTE — Progress Notes (Signed)
Name: Emily Pratt   MRN: [id number]    DOB: 11-23-1945   Date:04/24/2015       Progress Note  Subjective  Chief Complaint  Chief Complaint  Patient presents with  . Diabetes    pt here for 2 week follow up  . Edema  . COPD    HPI COPD Pt. Having persistent symptoms of recurrent productive cough, shortness of breath, especially on exertion, and fatigue. She was seen last 2 weeks ago and was started on antibiotics, and oral prednisone along with an inhaled steroid. She stopped using the inhaled corticosteroid after she described having 'jerks', which resolved after discontinuation of therapy. Past Medical History  Diagnosis Date  . Anxiety   . Arthritis   . COPD (chronic obstructive pulmonary disease)   . CHF (congestive heart failure)   . Hyperlipidemia   . Hypertension   . Diabetes mellitus without complication   . Chronic kidney disease   . Neuromuscular disorder     Past Surgical History  Procedure Laterality Date  . Joint replacement Bilateral 2006    Family History  Problem Relation Age of Onset  . Diabetes Mother   . Cancer Father   . Diabetes Sister   . Stroke Sister     Social History   Social History  . Marital Status: Married    Spouse Name: N/A  . Number of Children: N/A  . Years of Education: N/A   Occupational History  . Not on file.   Social History Main Topics  . Smoking status: Current Every Day Smoker    Types: Cigarettes  . Smokeless tobacco: Not on file  . Alcohol Use: No  . Drug Use: No  . Sexual Activity: Not on file   Other Topics Concern  . Not on file   Social History Narrative     Current outpatient prescriptions:  .  allopurinol (ZYLOPRIM) 300 MG tablet, Take 1 tablet (300 mg total) by mouth daily., Disp: 90 tablet, Rfl: 0 .  amoxicillin-clavulanate (AUGMENTIN) 875-125 MG per tablet, Take 1 tablet by mouth 2 (two) times daily., Disp: 20 tablet, Rfl: 0 .  colchicine 0.6 MG tablet, Take 1 tablet (0.6 mg total) by mouth  daily., Disp: 90 tablet, Rfl: 0 .  furosemide (LASIX) 80 MG tablet, , Disp: , Rfl:  .  JANUVIA 50 MG tablet, Take 1 tablet (50 mg total) by mouth daily., Disp: 90 tablet, Rfl: 0 .  LORazepam (ATIVAN) 1 MG tablet, , Disp: , Rfl:  .  losartan (COZAAR) 100 MG tablet, Take 1 tablet (100 mg total) by mouth daily., Disp: 90 tablet, Rfl: 0 .  LYRICA 50 MG capsule, Take 1 capsule (50 mg total) by mouth 3 (three) times daily., Disp: 90 capsule, Rfl: 2 .  metoprolol (LOPRESSOR) 100 MG tablet, Take 1 tablet (100 mg total) by mouth 2 (two) times daily., Disp: 180 tablet, Rfl: 0 .  mometasone-formoterol (DULERA) 100-5 MCG/ACT AERO, Inhale 2 puffs into the lungs 2 (two) times daily., Disp: 3 Inhaler, Rfl: 5 .  oxyCODONE-acetaminophen (PERCOCET) 10-325 MG per tablet, Take 1 tablet by mouth every 6 (six) hours as needed for pain., Disp: 120 tablet, Rfl: 0 .  predniSONE (DELTASONE) 20 MG tablet, Take 1 tablet (20 mg total) by mouth daily with breakfast., Disp: 10 tablet, Rfl: 0 .  VENTOLIN HFA 108 (90 BASE) MCG/ACT inhaler, , Disp: , Rfl:   Current facility-administered medications:  .  albuterol (PROVENTIL) (2.5 MG/3ML) 0.083% nebulizer solution 2.5 mg, 2.5  mg, Nebulization, Once, Ashok Norris, MD  Allergies  Allergen Reactions  . Atorvastatin     Does not remember why she had Intolerance to Lipitor.  . Fentanyl     itching  . Rosuvastatin Anxiety    Chest tigtness and feeling as if she had heartburn.     Review of Systems  Constitutional: Positive for malaise/fatigue. Negative for fever and chills.  Respiratory: Positive for cough, sputum production and shortness of breath. Negative for hemoptysis and wheezing.   Cardiovascular: Negative for chest pain.    Objective  Filed Vitals:   04/24/15 1513  BP: 126/68  Pulse: 85  Temp: 98.6 F (37 C)  Resp: 16  Height: '5\' 3"'$  (1.6 m)  Weight: 229 lb 3 oz (103.959 kg)  SpO2: 81%    Physical Exam  Constitutional: She is well-developed,  well-nourished, and in no distress.  HENT:  Head: Normocephalic and atraumatic.  Mouth/Throat: Posterior oropharyngeal erythema present.  Cardiovascular: Normal rate and regular rhythm.   Pulmonary/Chest: Effort normal. No respiratory distress. She has wheezes. She has rales. She exhibits no tenderness.  Nursing note and vitals reviewed.   Assessment & Plan  1. Chronic bronchitis, unspecified chronic bronchitis type  - DG Chest 2 View; Future - Comprehensive Metabolic Panel (CMET) - CBC - budesonide-formoterol (SYMBICORT) 160-4.5 MCG/ACT inhaler; Inhale 2 puffs into the lungs 2 (two) times daily.  Dispense: 1 Inhaler; Refill: 3 - Ambulatory referral to Pulmonology  2. Chronic obstructive pulmonary disease with acute exacerbation  - albuterol (PROVENTIL) (2.5 MG/3ML) 0.083% nebulizer solution 2.5 mg; Take 3 mLs (2.5 mg total) by nebulization once. Patient was sent to [facility] ED for persistent hypoxia suggestive of COPD exacerbation.  [facility] 04/24/2015 3:42 PM

## 2015-04-25 DIAGNOSIS — J441 Chronic obstructive pulmonary disease with (acute) exacerbation: Secondary | ICD-10-CM | POA: Diagnosis not present

## 2015-04-25 LAB — BASIC METABOLIC PANEL
ANION GAP: 9 (ref 5–15)
BUN: 20 mg/dL (ref 6–20)
CALCIUM: 8.6 mg/dL — AB (ref 8.9–10.3)
CHLORIDE: 94 mmol/L — AB (ref 101–111)
CO2: 37 mmol/L — AB (ref 22–32)
Creatinine, Ser: 1.16 mg/dL — ABNORMAL HIGH (ref 0.44–1.00)
GFR calc non Af Amer: 48 mL/min — ABNORMAL LOW (ref >60)
GFR, EST AFRICAN AMERICAN: 55 mL/min — AB (ref >60)
Glucose, Bld: 201 mg/dL — ABNORMAL HIGH (ref 65–99)
Potassium: 4.3 mmol/L (ref 3.5–5.1)
SODIUM: 140 mmol/L (ref 135–145)

## 2015-04-25 LAB — CBC
HCT: 42.7 % (ref 35.0–47.0)
HEMOGLOBIN: 14 g/dL (ref 12.0–16.0)
MCH: 29.9 pg (ref 26.0–34.0)
MCHC: 32.7 g/dL (ref 32.0–36.0)
MCV: 91.5 fL (ref 80.0–100.0)
Platelets: 104 10*3/uL — ABNORMAL LOW (ref 150–440)
RBC: 4.67 MIL/uL (ref 3.80–5.20)
RDW: 16.4 % — ABNORMAL HIGH (ref 11.5–14.5)
WBC: 3.7 10*3/uL (ref 3.6–11.0)

## 2015-04-25 LAB — GLUCOSE, CAPILLARY
GLUCOSE-CAPILLARY: 186 mg/dL — AB (ref 65–99)
GLUCOSE-CAPILLARY: 212 mg/dL — AB (ref 65–99)

## 2015-04-25 LAB — HEMOGLOBIN A1C: Hgb A1c MFr Bld: 6.6 % — ABNORMAL HIGH (ref 4.0–6.0)

## 2015-04-25 MED ORDER — PREDNISONE 10 MG PO TABS: 10.0000 mg | ORAL_TABLET | Freq: Every day | ORAL | Status: AC

## 2015-04-25 MED ORDER — INFLUENZA VAC SPLIT QUAD 0.5 ML IM SUSY: 0.5000 mL | PREFILLED_SYRINGE | INTRAMUSCULAR | Status: DC

## 2015-04-25 MED ORDER — INFLUENZA VAC SPLIT QUAD 0.5 ML IM SUSY
0.5000 mL | PREFILLED_SYRINGE | Freq: Once | INTRAMUSCULAR | Status: AC
  Administered 2015-04-25: 0.5 mL via INTRAMUSCULAR
  Filled 2015-04-25: qty 0.5

## 2015-04-25 MED ORDER — LEVOFLOXACIN 750 MG PO TABS: 750.0000 mg | ORAL_TABLET | Freq: Every day | ORAL | Status: DC

## 2015-04-25 MED ORDER — NICOTINE 14 MG/24HR TD PT24: 14.0000 mg | MEDICATED_PATCH | Freq: Every day | TRANSDERMAL | Status: DC

## 2015-04-25 NOTE — Discharge Planning (Signed)
Pt IV removed. DC paper given, explained and educated.  Pt informed of suggested FU appts, and given scripts.  Pt VSS and RN assessment revealed stability for DC to home.  When ride arrived - pt will be wheeled to front and family transporting home via car.

## 2015-04-25 NOTE — Discharge Summary (Signed)
[place] [facility] [place] NAME: Emily Pratt    MR#:  [id number]  DATE OF BIRTH:  1945/09/01  DATE OF ADMISSION:  04/24/2015 ADMITTING PHYSICIAN: Gladstone Lighter, MD  DATE OF DISCHARGE: 04/25/2015 PRIMARY CARE PHYSICIAN: Keith Rake, MD    ADMISSION DIAGNOSIS:  Shortness of breath [R06.02] Community acquired pneumonia [J18.9] Hypoxia [R09.02] Chronic obstructive pulmonary disease, unspecified COPD, unspecified chronic bronchitis type [J44.9]  DISCHARGE DIAGNOSIS:  Active Problems:   Pneumonia   SECONDARY DIAGNOSIS:   Past Medical History  Diagnosis Date  . Anxiety   . Arthritis   . COPD (chronic obstructive pulmonary disease)   . CHF (congestive heart failure)   . Hyperlipidemia   . Hypertension   . Diabetes mellitus without complication   . Chronic kidney disease   . Neuromuscular disorder     HOSPITAL COURSE:  This is a 69 year old female with a history of anxiety and COPD who presented with shortness of breath and found to have COPD exacerbation along with right sided pneumonia.  1. Acute on chronic COPD exacerbation: Patient was treated for COPD exacerbation with IV steroids and DuoNeb's. Patient actually has no wheezing on examination at discharge. Patient has done quite well. Patient does wear oxygen at night and may require oxygen during the day due to her pneumonia and COPD.  2. Community-acquired pneumonia: Patient's chest x-ray shows right upper lobe pneumonia with consolidation at the lower lobe as well. Patient was placed on Rocephin and azithromycin. Patient will be discharged on Levaquin. Patient will need follow-up in one week with her primary care physician.  3. history of CHF: Unknown if this is diastolic or systolic. Patient will continue with Lasix. There is no evidence of acute exacerbation.  4. Diabetes2 without complication: Patient will continue on outpatient medications. She'll need a follow-up with  her primary care physician. Her blood sugars should be monitored closely while she is on steroids for problem #1.  5. Tobacco dependence: Patient is encouraged to stop smoking. She says she is trying to quit smoking. Patient was counseled for 3 minutes.  6. Hypokalemia: Potassium was repleted.  7. Essential hypertension: Patient continue losartan and the Toprol.   DISCHARGE CONDITIONS AND DIET:  Patient will be discharged home in stable condition  CONSULTS OBTAINED:     DRUG ALLERGIES:   Allergies  Allergen Reactions  . Atorvastatin     Does not remember why she had Intolerance to Lipitor.  . Fentanyl     itching  . Rosuvastatin Anxiety    Chest tigtness and feeling as if she had heartburn.    DISCHARGE MEDICATIONS:   Current Discharge Medication List    START taking these medications   Details  levofloxacin (LEVAQUIN) 750 MG tablet Take 1 tablet (750 mg total) by mouth daily. Qty: 6 tablet, Refills: 0    nicotine (NICODERM CQ) 14 mg/24hr patch Place 1 patch (14 mg total) onto the skin daily. Qty: 28 patch, Refills: 0    predniSONE (DELTASONE) 10 MG tablet Take 1 tablet (10 mg total) by mouth daily with breakfast. Qty: 30 tablet, Refills: 0      CONTINUE these medications which have NOT CHANGED   Details  allopurinol (ZYLOPRIM) 300 MG tablet Take 1 tablet (300 mg total) by mouth daily. 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 Qty: 1 Inhaler, Refills: 3   Associated Diagnoses: Chronic bronchitis, unspecified chronic bronchitis type    colchicine 0.6 MG tablet Take 1 tablet (0.6 mg total) by mouth daily. 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     JANUVIA 50 MG  tablet Take 1 tablet (50 mg total) by mouth daily. Qty: 90 tablet, Refills: 0   Associated Diagnoses: DM (diabetes mellitus), type 2 with neurological complications    LORazepam (ATIVAN) 1 MG tablet Take 1 mg by mouth every 6 (six) hours as needed.     losartan (COZAAR) 100 MG tablet Take 1 tablet (100 mg total) by mouth daily. Qty: 90 tablet, Refills: 0   Associated Diagnoses: Essential hypertension    LYRICA 50 MG capsule Take 1 capsule (50 mg total) by mouth 3 (three) times daily. Qty: 90 capsule, Refills: 2   Associated Diagnoses: DM (diabetes mellitus), type 2 with neurological complications    metoprolol (LOPRESSOR) 100 MG tablet Take 1 tablet (100 mg total) by mouth 2 (two) times daily. Qty: 180 tablet, Refills: 0   Associated Diagnoses: Essential hypertension    oxyCODONE-acetaminophen (PERCOCET) 10-325 MG per tablet Take 1 tablet by mouth every 6 (six) hours as needed for pain. Qty: 120 tablet, Refills: 0   Associated Diagnoses: Chronic radicular low back pain    VENTOLIN HFA 108 (90 BASE) MCG/ACT inhaler Inhale 1 puff into the lungs every 4 (four) hours as needed.               Today   CHIEF COMPLAINT:  Patient is doing well this morning. Patient would like to go home. Patient feels 100% better. Patient continues have a cough but no wheezing or shortness of breath.   VITAL SIGNS:  Blood pressure 138/79, pulse 83, temperature 98.2 F (36.8 C), temperature source Oral, resp. rate 18, height '5\' 3"'$  (1.6 m), weight 104.69 kg (230 lb 12.8 oz), SpO2 95 %.   REVIEW OF SYSTEMS:  Review of Systems  Constitutional: Negative for fever, chills and malaise/fatigue.  HENT: Negative for sore throat.   Eyes: Negative for blurred vision.  Respiratory: Positive for cough. Negative for hemoptysis, shortness of breath and wheezing.   Cardiovascular: Negative for chest pain, palpitations and leg swelling.  Gastrointestinal: Negative for nausea, vomiting, abdominal pain,  diarrhea and blood in stool.  Genitourinary: Negative for dysuria.  Musculoskeletal: Negative for back pain.  Neurological: Negative for dizziness, tremors and headaches.  Endo/Heme/Allergies: Does not bruise/bleed easily.     PHYSICAL EXAMINATION:  GENERAL:  69 y.o.-year-old patient lying in the bed with no acute distress.  NECK:  Supple, no jugular venous distention. No thyroid enlargement, no tenderness.  LUNGS: Normal breath sounds bilaterally, no wheezing, rales,rhonchi  No use of accessory muscles of respiration.  CARDIOVASCULAR: S1, S2 normal. No murmurs, rubs, or gallops.  ABDOMEN: Soft, non-tender, non-distended. Bowel sounds present. No organomegaly or mass.  EXTREMITIES: No pedal edema, cyanosis, or clubbing.  PSYCHIATRIC: The patient is alert and oriented x 3.  SKIN: No obvious rash, lesion, or ulcer.   DATA REVIEW:   CBC  Recent Labs Lab 04/25/15 0459  WBC 3.7  HGB 14.0  HCT 42.7  PLT 104*    Chemistries   Recent Labs Lab 04/25/15 0459  NA 140  K 4.3  CL 94*  CO2 37*  GLUCOSE 201*  BUN 20  CREATININE 1.16*  CALCIUM 8.6*    Cardiac Enzymes  Recent Labs Lab 04/24/15 1722  TROPONINI <0.03    Microbiology Results  '@MICRORSLT48'$ @  RADIOLOGY:  Dg Chest Portable 1 View  04/24/2015   CLINICAL DATA:  Return productive cough for, shortness of breath for the last 2 weeks.  EXAM: PORTABLE CHEST 1 VIEW  COMPARISON:  July 14, 2009  FINDINGS: The mediastinal contour is stable. The heart size is enlarged. There is right upper lobe pneumonia. There is also consolidation of right lung base, pneumonia is not excluded. The left lung is clear. The bony structures are stable.  IMPRESSION: Right upper lobe pneumonia. In addition, there is consolidation right lung base, pneumonia is not excluded.   Electronically Signed   By: Abelardo Diesel M.D.   On: 04/24/2015 18:02      Management plans discussed with the patient and she is in agreement. Stable for discharge  home  Patient should follow up with PCP in one week  CODE STATUS:     Code Status Orders        Start     Ordered   04/24/15 2033  Full code   Continuous     04/24/15 2032      TOTAL TIME TAKING CARE OF THIS PATIENT: 35 minutes.    MODY, SITAL M.D on 04/25/2015 at 11:47 AM  Between 7am to 6pm - Pager - [phone] After 6pm go to [email or web address] - password [email or web address] [facility]  [facility]  Office  [phone]  CC: Primary care physician; Keith Rake, MD

## 2015-04-29 ENCOUNTER — Telehealth: Payer: Self-pay | Admitting: Family Medicine

## 2015-04-29 NOTE — Telephone Encounter (Signed)
Routed to Dr. Shah for advice  

## 2015-04-29 NOTE — Telephone Encounter (Signed)
Patient is taking antibiotics but it is hurting her stomach, please advise

## 2015-04-29 NOTE — Telephone Encounter (Signed)
Patient has "stomach pain"which she believes is from the antibiotic. No diarrhea. Records show that she was prescribed levofloxacin at discharge.recommended that she take levofloxacin after dinner and would half cup of milk. Patient verbalized understanding. She will call if she still has any concerns.

## 2015-04-30 LAB — CULTURE, BLOOD (ROUTINE X 2)
CULTURE: NO GROWTH
Culture: NO GROWTH

## 2015-04-30 NOTE — Telephone Encounter (Signed)
Dr. Manuella Ghazi has spoken with patient and he states patient has "stomach pain"which she believes is from the antibiotic. No diarrhea. Records show that she was prescribed levofloxacin at discharge.recommended that she take levofloxacin after dinner and would half cup of milk. Patient verbalized understanding. She will call if she still has any concerns.

## 2015-05-05 ENCOUNTER — Ambulatory Visit: Payer: Medicare Other | Admitting: Family Medicine

## 2015-05-07 ENCOUNTER — Encounter: Payer: Self-pay | Admitting: Family Medicine

## 2015-05-07 ENCOUNTER — Ambulatory Visit (INDEPENDENT_AMBULATORY_CARE_PROVIDER_SITE_OTHER): Payer: Medicare Other | Admitting: Family Medicine

## 2015-05-07 VITALS — BP 128/88 | HR 94 | Temp 98.4°F | Resp 19 | Ht 63.0 in | Wt 223.8 lb

## 2015-05-07 DIAGNOSIS — G8929 Other chronic pain: Secondary | ICD-10-CM

## 2015-05-07 DIAGNOSIS — J189 Pneumonia, unspecified organism: Secondary | ICD-10-CM | POA: Diagnosis not present

## 2015-05-07 DIAGNOSIS — J42 Unspecified chronic bronchitis: Secondary | ICD-10-CM

## 2015-05-07 DIAGNOSIS — M545 Low back pain: Secondary | ICD-10-CM

## 2015-05-07 MED ORDER — OXYCODONE-ACETAMINOPHEN 10-325 MG PO TABS: 1.0000 | ORAL_TABLET | Freq: Four times a day (QID) | ORAL | Status: AC | PRN

## 2015-05-07 MED ORDER — ALBUTEROL SULFATE (2.5 MG/3ML) 0.083% IN NEBU: 2.5000 mg | INHALATION_SOLUTION | Freq: Four times a day (QID) | RESPIRATORY_TRACT | Status: AC | PRN

## 2015-05-07 NOTE — Progress Notes (Signed)
Name: Emily Pratt   MRN: [id number]    DOB: May 02, 1946   Date:05/07/2015       Progress Note  Subjective  Chief Complaint  Chief Complaint  Patient presents with  . Medication Refill  . Hyperlipidemia  . Congestive Heart Failure   Back Pain This is a chronic problem. The pain is present in the lumbar spine and gluteal. The pain is at a severity of 6/10. The symptoms are aggravated by bending and position. Associated symptoms include leg pain. Pertinent negatives include no bladder incontinence, bowel incontinence, chest pain or fever. She has tried analgesics (Percocet 10-325 mg q 6hr PRN) for the symptoms.   Pt. Is here for follow up of Community Acquired Pneumonia. She was diagnosed with CAP while in hospital for COPD Exacerbation and was started on Azithromycin and Rocephin and discharged on Levaquin. She feels better, although still coughing and hoarse. She is on Home Oxygen at night for COPD.  Past Medical History  Diagnosis Date  . Anxiety   . Arthritis   . COPD (chronic obstructive pulmonary disease) ([place])   . CHF (congestive heart failure) ([place])   . Hyperlipidemia   . Hypertension   . Diabetes mellitus without complication ([employer])   . Chronic kidney disease   . Neuromuscular disorder [facility])     Past Surgical History  Procedure Laterality Date  . Joint replacement Bilateral 2006    both knees  . Ankle surgery      Family History  Problem Relation Age of Onset  . Diabetes Mother   . Cancer Father   . Diabetes Sister   . Stroke Sister     Social History   Social History  . Marital Status: Married    Spouse Name: N/A  . Number of Children: N/A  . Years of Education: N/A   Occupational History  . Not on file.   Social History Main Topics  . Smoking status: Current Every Day Smoker    Types: Cigarettes  . Smokeless tobacco: Not on file     Comment: 0.5 pack /day  . Alcohol Use: No  . Drug Use: No  . Sexual Activity: Not on file   Other Topics Concern   . Not on file   Social History Narrative   Lives at home with husband.   Has a walker    Current outpatient prescriptions:  .  allopurinol (ZYLOPRIM) 300 MG tablet, Take 1 tablet (300 mg total) by mouth daily., Disp: 90 tablet, Rfl: 0 .  budesonide-formoterol (SYMBICORT) 160-4.5 MCG/ACT inhaler, Inhale 2 puffs into the lungs 2 (two) times daily., Disp: 1 Inhaler, Rfl: 3 .  colchicine 0.6 MG tablet, Take 1 tablet (0.6 mg total) by mouth daily., Disp: 90 tablet, Rfl: 0 .  furosemide (LASIX) 80 MG tablet, Take 80 mg by mouth daily. , Disp: , Rfl:  .  JANUVIA 50 MG tablet, Take 1 tablet (50 mg total) by mouth daily., Disp: 90 tablet, Rfl: 0 .  losartan (COZAAR) 100 MG tablet, Take 1 tablet (100 mg total) by mouth daily., Disp: 90 tablet, Rfl: 0 .  LYRICA 50 MG capsule, Take 1 capsule (50 mg total) by mouth 3 (three) times daily., Disp: 90 capsule, Rfl: 2 .  metoprolol (LOPRESSOR) 100 MG tablet, Take 1 tablet (100 mg total) by mouth 2 (two) times daily., Disp: 180 tablet, Rfl: 0 .  oxyCODONE-acetaminophen (PERCOCET) 10-325 MG per tablet, Take 1 tablet by mouth every 6 (six) hours as needed for pain., Disp:  120 tablet, Rfl: 0 .  predniSONE (DELTASONE) 10 MG tablet, Take 1 tablet (10 mg total) by mouth daily with breakfast., Disp: 30 tablet, Rfl: 0 .  VENTOLIN HFA 108 (90 BASE) MCG/ACT inhaler, Inhale 1 puff into the lungs every 4 (four) hours as needed. , Disp: , Rfl:  .  levofloxacin (LEVAQUIN) 750 MG tablet, Take 1 tablet (750 mg total) by mouth daily. (Patient not taking: Reported on 05/07/2015), Disp: 6 tablet, Rfl: 0 .  LORazepam (ATIVAN) 1 MG tablet, Take 1 mg by mouth every 6 (six) hours as needed. , Disp: , Rfl:  .  nicotine (NICODERM CQ) 14 mg/24hr patch, Place 1 patch (14 mg total) onto the skin daily. (Patient not taking: Reported on 05/07/2015), Disp: 28 patch, Rfl: 0  Current facility-administered medications:  .  albuterol (PROVENTIL) (2.5 MG/3ML) 0.083% nebulizer solution 2.5 mg,  2.5 mg, Nebulization, Once, Ashok Norris, MD .  albuterol (PROVENTIL) (2.5 MG/3ML) 0.083% nebulizer solution 2.5 mg, 2.5 mg, Nebulization, Once, Roselee Nova, MD  Allergies  Allergen Reactions  . Atorvastatin     Does not remember why she had Intolerance to Lipitor.  . Fentanyl     itching  . Rosuvastatin Anxiety    Chest tigtness and feeling as if she had heartburn.   Review of Systems  Constitutional: Negative for fever and chills.  Respiratory: Positive for cough and shortness of breath. Negative for hemoptysis and wheezing.   Cardiovascular: Negative for chest pain.  Gastrointestinal: Negative for bowel incontinence.  Genitourinary: Negative for bladder incontinence.  Musculoskeletal: Positive for back pain and joint pain.   Objective  Filed Vitals:   05/07/15 1009  BP: 128/88  Pulse: 94  Temp: 98.4 F (36.9 C)  TempSrc: Oral  Resp: 19  Height: '5\' 3"'$  (1.6 m)  Weight: 223 lb 12.8 oz (101.515 kg)  SpO2: 88%    Physical Exam  Constitutional: She is well-developed, well-nourished, and in no distress.  Cardiovascular: Normal rate and regular rhythm.   Pulmonary/Chest: No respiratory distress. She has decreased breath sounds. She has no wheezes. She has rales.  Bilateral crackles L > R  Musculoskeletal:       Lumbar back: She exhibits decreased range of motion, tenderness and pain. She exhibits no spasm.       Back:  Tenderness to palpation over the left lower back extending down to the left gluteal area.  Nursing note and vitals reviewed.   Assessment & Plan  1. Chronic bronchitis, unspecified chronic bronchitis type ([place]) Rx for albuterol nebulizer solution provided. She is to continue on Symbicort and Ventolin for COPD. - albuterol (PROVENTIL) (2.5 MG/3ML) 0.083% nebulizer solution; Take 3 mLs (2.5 mg total) by nebulization every 6 (six) hours as needed for wheezing or shortness of breath.  Dispense: 150 mL; Refill: 1  2. Chronic left-sided low back pain  without sciatica In terms of chronic low back pain and left gluteal pain stable on present opioid therapy. Patient compliant with controlled substances agreement and understands the dependence and tolerance potential of opioids. Feels provided. - oxyCODONE-acetaminophen (PERCOCET) 10-325 MG tablet; Take 1 tablet by mouth every 6 (six) hours as needed for pain.  Dispense: 120 tablet; Refill: 0  3. CAP (community acquired pneumonia) Obtain chest x-ray after completion of antibiotic therapy to ensure resolution of pneumonia. - DG Chest 2 View; Future   Lera Gaines [facility] 05/07/2015 10:17 AM

## 2015-05-09 ENCOUNTER — Telehealth: Payer: Self-pay | Admitting: Family Medicine

## 2015-05-09 MED ORDER — TIOTROPIUM BROMIDE MONOHYDRATE 18 MCG IN CAPS: 18.0000 ug | ORAL_CAPSULE | Freq: Every day | RESPIRATORY_TRACT | Status: AC

## 2015-05-09 NOTE — Telephone Encounter (Signed)
Medication has been refilled and sent to [facility] [address]

## 2015-05-19 ENCOUNTER — Inpatient Hospital Stay
Admission: EM | Admit: 2015-05-19 | Discharge: 2015-07-27 | DRG: 004 | Disposition: E | Payer: Medicare Other | Attending: Pulmonary Disease | Admitting: Pulmonary Disease

## 2015-05-19 ENCOUNTER — Encounter: Payer: Self-pay | Admitting: Emergency Medicine

## 2015-05-19 ENCOUNTER — Emergency Department: Payer: Medicare Other

## 2015-05-19 DIAGNOSIS — N183 Chronic kidney disease, stage 3 (moderate): Secondary | ICD-10-CM | POA: Diagnosis present

## 2015-05-19 DIAGNOSIS — I9589 Other hypotension: Secondary | ICD-10-CM | POA: Diagnosis present

## 2015-05-19 DIAGNOSIS — J910 Malignant pleural effusion: Secondary | ICD-10-CM | POA: Diagnosis present

## 2015-05-19 DIAGNOSIS — J80 Acute respiratory distress syndrome: Secondary | ICD-10-CM | POA: Diagnosis not present

## 2015-05-19 DIAGNOSIS — J9602 Acute respiratory failure with hypercapnia: Secondary | ICD-10-CM | POA: Diagnosis not present

## 2015-05-19 DIAGNOSIS — L8990 Pressure ulcer of unspecified site, unspecified stage: Secondary | ICD-10-CM | POA: Insufficient documentation

## 2015-05-19 DIAGNOSIS — E785 Hyperlipidemia, unspecified: Secondary | ICD-10-CM | POA: Diagnosis present

## 2015-05-19 DIAGNOSIS — Z7984 Long term (current) use of oral hypoglycemic drugs: Secondary | ICD-10-CM | POA: Diagnosis not present

## 2015-05-19 DIAGNOSIS — Z9981 Dependence on supplemental oxygen: Secondary | ICD-10-CM | POA: Diagnosis not present

## 2015-05-19 DIAGNOSIS — Z66 Do not resuscitate: Secondary | ICD-10-CM | POA: Diagnosis not present

## 2015-05-19 DIAGNOSIS — B3749 Other urogenital candidiasis: Secondary | ICD-10-CM | POA: Diagnosis not present

## 2015-05-19 DIAGNOSIS — K567 Ileus, unspecified: Secondary | ICD-10-CM | POA: Diagnosis not present

## 2015-05-19 DIAGNOSIS — M109 Gout, unspecified: Secondary | ICD-10-CM | POA: Diagnosis present

## 2015-05-19 DIAGNOSIS — J9622 Acute and chronic respiratory failure with hypercapnia: Secondary | ICD-10-CM | POA: Diagnosis present

## 2015-05-19 DIAGNOSIS — N179 Acute kidney failure, unspecified: Secondary | ICD-10-CM | POA: Diagnosis not present

## 2015-05-19 DIAGNOSIS — Z930 Tracheostomy status: Secondary | ICD-10-CM

## 2015-05-19 DIAGNOSIS — C3490 Malignant neoplasm of unspecified part of unspecified bronchus or lung: Secondary | ICD-10-CM | POA: Diagnosis not present

## 2015-05-19 DIAGNOSIS — L89152 Pressure ulcer of sacral region, stage 2: Secondary | ICD-10-CM | POA: Diagnosis present

## 2015-05-19 DIAGNOSIS — M1990 Unspecified osteoarthritis, unspecified site: Secondary | ICD-10-CM | POA: Diagnosis present

## 2015-05-19 DIAGNOSIS — D696 Thrombocytopenia, unspecified: Secondary | ICD-10-CM | POA: Diagnosis not present

## 2015-05-19 DIAGNOSIS — R918 Other nonspecific abnormal finding of lung field: Secondary | ICD-10-CM | POA: Insufficient documentation

## 2015-05-19 DIAGNOSIS — Z823 Family history of stroke: Secondary | ICD-10-CM | POA: Diagnosis not present

## 2015-05-19 DIAGNOSIS — E8770 Fluid overload, unspecified: Secondary | ICD-10-CM | POA: Diagnosis not present

## 2015-05-19 DIAGNOSIS — E870 Hyperosmolality and hypernatremia: Secondary | ICD-10-CM | POA: Diagnosis present

## 2015-05-19 DIAGNOSIS — I429 Cardiomyopathy, unspecified: Secondary | ICD-10-CM | POA: Diagnosis present

## 2015-05-19 DIAGNOSIS — E1122 Type 2 diabetes mellitus with diabetic chronic kidney disease: Secondary | ICD-10-CM | POA: Diagnosis present

## 2015-05-19 DIAGNOSIS — R0600 Dyspnea, unspecified: Secondary | ICD-10-CM

## 2015-05-19 DIAGNOSIS — R0602 Shortness of breath: Secondary | ICD-10-CM

## 2015-05-19 DIAGNOSIS — Z6837 Body mass index (BMI) 37.0-37.9, adult: Secondary | ICD-10-CM

## 2015-05-19 DIAGNOSIS — Z515 Encounter for palliative care: Secondary | ICD-10-CM | POA: Diagnosis not present

## 2015-05-19 DIAGNOSIS — C3401 Malignant neoplasm of right main bronchus: Secondary | ICD-10-CM | POA: Diagnosis not present

## 2015-05-19 DIAGNOSIS — E441 Mild protein-calorie malnutrition: Secondary | ICD-10-CM | POA: Diagnosis present

## 2015-05-19 DIAGNOSIS — J9601 Acute respiratory failure with hypoxia: Secondary | ICD-10-CM | POA: Diagnosis not present

## 2015-05-19 DIAGNOSIS — T17990A Other foreign object in respiratory tract, part unspecified in causing asphyxiation, initial encounter: Secondary | ICD-10-CM | POA: Diagnosis present

## 2015-05-19 DIAGNOSIS — E861 Hypovolemia: Secondary | ICD-10-CM | POA: Diagnosis not present

## 2015-05-19 DIAGNOSIS — F05 Delirium due to known physiological condition: Secondary | ICD-10-CM | POA: Diagnosis present

## 2015-05-19 DIAGNOSIS — J9691 Respiratory failure, unspecified with hypoxia: Secondary | ICD-10-CM

## 2015-05-19 DIAGNOSIS — Z809 Family history of malignant neoplasm, unspecified: Secondary | ICD-10-CM

## 2015-05-19 DIAGNOSIS — D701 Agranulocytosis secondary to cancer chemotherapy: Secondary | ICD-10-CM | POA: Diagnosis not present

## 2015-05-19 DIAGNOSIS — Z833 Family history of diabetes mellitus: Secondary | ICD-10-CM

## 2015-05-19 DIAGNOSIS — Z96653 Presence of artificial knee joint, bilateral: Secondary | ICD-10-CM | POA: Diagnosis present

## 2015-05-19 DIAGNOSIS — I471 Supraventricular tachycardia: Secondary | ICD-10-CM | POA: Diagnosis present

## 2015-05-19 DIAGNOSIS — E669 Obesity, unspecified: Secondary | ICD-10-CM | POA: Diagnosis present

## 2015-05-19 DIAGNOSIS — C3491 Malignant neoplasm of unspecified part of right bronchus or lung: Secondary | ICD-10-CM | POA: Diagnosis not present

## 2015-05-19 DIAGNOSIS — E11649 Type 2 diabetes mellitus with hypoglycemia without coma: Secondary | ICD-10-CM | POA: Diagnosis not present

## 2015-05-19 DIAGNOSIS — C801 Malignant (primary) neoplasm, unspecified: Secondary | ICD-10-CM | POA: Diagnosis not present

## 2015-05-19 DIAGNOSIS — E1140 Type 2 diabetes mellitus with diabetic neuropathy, unspecified: Secondary | ICD-10-CM | POA: Diagnosis present

## 2015-05-19 DIAGNOSIS — I10 Essential (primary) hypertension: Secondary | ICD-10-CM | POA: Diagnosis not present

## 2015-05-19 DIAGNOSIS — C3411 Malignant neoplasm of upper lobe, right bronchus or lung: Secondary | ICD-10-CM | POA: Diagnosis not present

## 2015-05-19 DIAGNOSIS — E1165 Type 2 diabetes mellitus with hyperglycemia: Secondary | ICD-10-CM | POA: Diagnosis present

## 2015-05-19 DIAGNOSIS — D649 Anemia, unspecified: Secondary | ICD-10-CM | POA: Diagnosis not present

## 2015-05-19 DIAGNOSIS — L89153 Pressure ulcer of sacral region, stage 3: Secondary | ICD-10-CM | POA: Diagnosis not present

## 2015-05-19 DIAGNOSIS — E279 Disorder of adrenal gland, unspecified: Secondary | ICD-10-CM | POA: Diagnosis present

## 2015-05-19 DIAGNOSIS — J9692 Respiratory failure, unspecified with hypercapnia: Secondary | ICD-10-CM | POA: Diagnosis present

## 2015-05-19 DIAGNOSIS — I4891 Unspecified atrial fibrillation: Secondary | ICD-10-CM | POA: Diagnosis present

## 2015-05-19 DIAGNOSIS — C3492 Malignant neoplasm of unspecified part of left bronchus or lung: Secondary | ICD-10-CM | POA: Diagnosis not present

## 2015-05-19 DIAGNOSIS — J9621 Acute and chronic respiratory failure with hypoxia: Secondary | ICD-10-CM | POA: Diagnosis present

## 2015-05-19 DIAGNOSIS — J9620 Acute and chronic respiratory failure, unspecified whether with hypoxia or hypercapnia: Secondary | ICD-10-CM | POA: Diagnosis not present

## 2015-05-19 DIAGNOSIS — Z7952 Long term (current) use of systemic steroids: Secondary | ICD-10-CM

## 2015-05-19 DIAGNOSIS — Z09 Encounter for follow-up examination after completed treatment for conditions other than malignant neoplasm: Secondary | ICD-10-CM

## 2015-05-19 DIAGNOSIS — J189 Pneumonia, unspecified organism: Secondary | ICD-10-CM | POA: Diagnosis present

## 2015-05-19 DIAGNOSIS — Z9911 Dependence on respirator [ventilator] status: Secondary | ICD-10-CM | POA: Diagnosis not present

## 2015-05-19 DIAGNOSIS — I130 Hypertensive heart and chronic kidney disease with heart failure and stage 1 through stage 4 chronic kidney disease, or unspecified chronic kidney disease: Secondary | ICD-10-CM | POA: Diagnosis present

## 2015-05-19 DIAGNOSIS — Z7951 Long term (current) use of inhaled steroids: Secondary | ICD-10-CM

## 2015-05-19 DIAGNOSIS — J0410 Acute tracheitis without obstruction: Secondary | ICD-10-CM | POA: Diagnosis not present

## 2015-05-19 DIAGNOSIS — R400 Somnolence: Secondary | ICD-10-CM | POA: Diagnosis not present

## 2015-05-19 DIAGNOSIS — E871 Hypo-osmolality and hyponatremia: Secondary | ICD-10-CM | POA: Diagnosis present

## 2015-05-19 DIAGNOSIS — N170 Acute kidney failure with tubular necrosis: Secondary | ICD-10-CM | POA: Diagnosis present

## 2015-05-19 DIAGNOSIS — J948 Other specified pleural conditions: Secondary | ICD-10-CM | POA: Diagnosis not present

## 2015-05-19 DIAGNOSIS — D6959 Other secondary thrombocytopenia: Secondary | ICD-10-CM | POA: Diagnosis not present

## 2015-05-19 DIAGNOSIS — J9811 Atelectasis: Secondary | ICD-10-CM | POA: Diagnosis not present

## 2015-05-19 DIAGNOSIS — Z95828 Presence of other vascular implants and grafts: Secondary | ICD-10-CM

## 2015-05-19 DIAGNOSIS — Z7401 Bed confinement status: Secondary | ICD-10-CM | POA: Diagnosis not present

## 2015-05-19 DIAGNOSIS — E876 Hypokalemia: Secondary | ICD-10-CM | POA: Diagnosis present

## 2015-05-19 DIAGNOSIS — F17210 Nicotine dependence, cigarettes, uncomplicated: Secondary | ICD-10-CM | POA: Diagnosis present

## 2015-05-19 DIAGNOSIS — D61818 Other pancytopenia: Secondary | ICD-10-CM | POA: Diagnosis not present

## 2015-05-19 DIAGNOSIS — R5381 Other malaise: Secondary | ICD-10-CM | POA: Diagnosis not present

## 2015-05-19 DIAGNOSIS — D702 Other drug-induced agranulocytosis: Secondary | ICD-10-CM

## 2015-05-19 DIAGNOSIS — I5043 Acute on chronic combined systolic (congestive) and diastolic (congestive) heart failure: Secondary | ICD-10-CM | POA: Diagnosis present

## 2015-05-19 DIAGNOSIS — R590 Localized enlarged lymph nodes: Secondary | ICD-10-CM | POA: Diagnosis present

## 2015-05-19 DIAGNOSIS — E872 Acidosis: Secondary | ICD-10-CM | POA: Diagnosis present

## 2015-05-19 DIAGNOSIS — J440 Chronic obstructive pulmonary disease with acute lower respiratory infection: Secondary | ICD-10-CM | POA: Diagnosis present

## 2015-05-19 DIAGNOSIS — T451X5A Adverse effect of antineoplastic and immunosuppressive drugs, initial encounter: Secondary | ICD-10-CM | POA: Diagnosis not present

## 2015-05-19 DIAGNOSIS — I509 Heart failure, unspecified: Secondary | ICD-10-CM | POA: Diagnosis not present

## 2015-05-19 DIAGNOSIS — R1310 Dysphagia, unspecified: Secondary | ICD-10-CM | POA: Diagnosis present

## 2015-05-19 DIAGNOSIS — J441 Chronic obstructive pulmonary disease with (acute) exacerbation: Secondary | ICD-10-CM | POA: Diagnosis not present

## 2015-05-19 DIAGNOSIS — I340 Nonrheumatic mitral (valve) insufficiency: Secondary | ICD-10-CM | POA: Diagnosis not present

## 2015-05-19 DIAGNOSIS — R140 Abdominal distension (gaseous): Secondary | ICD-10-CM

## 2015-05-19 DIAGNOSIS — J90 Pleural effusion, not elsewhere classified: Secondary | ICD-10-CM | POA: Insufficient documentation

## 2015-05-19 DIAGNOSIS — D6489 Other specified anemias: Secondary | ICD-10-CM | POA: Diagnosis present

## 2015-05-19 DIAGNOSIS — J9690 Respiratory failure, unspecified, unspecified whether with hypoxia or hypercapnia: Secondary | ICD-10-CM | POA: Diagnosis not present

## 2015-05-19 DIAGNOSIS — Z0189 Encounter for other specified special examinations: Secondary | ICD-10-CM

## 2015-05-19 DIAGNOSIS — Z01818 Encounter for other preprocedural examination: Secondary | ICD-10-CM

## 2015-05-19 DIAGNOSIS — J9600 Acute respiratory failure, unspecified whether with hypoxia or hypercapnia: Secondary | ICD-10-CM | POA: Diagnosis not present

## 2015-05-19 DIAGNOSIS — Z4659 Encounter for fitting and adjustment of other gastrointestinal appliance and device: Secondary | ICD-10-CM

## 2015-05-19 DIAGNOSIS — Z79899 Other long term (current) drug therapy: Secondary | ICD-10-CM | POA: Diagnosis not present

## 2015-05-19 LAB — COMPREHENSIVE METABOLIC PANEL
ALBUMIN: 3.4 g/dL — AB (ref 3.5–5.0)
ALK PHOS: 79 U/L (ref 38–126)
ALT: 13 U/L — ABNORMAL LOW (ref 14–54)
AST: 24 U/L (ref 15–41)
Anion gap: 9 (ref 5–15)
BILIRUBIN TOTAL: 0.8 mg/dL (ref 0.3–1.2)
BUN: 27 mg/dL — AB (ref 6–20)
CALCIUM: 8.9 mg/dL (ref 8.9–10.3)
CO2: 36 mmol/L — ABNORMAL HIGH (ref 22–32)
CREATININE: 1.82 mg/dL — AB (ref 0.44–1.00)
Chloride: 94 mmol/L — ABNORMAL LOW (ref 101–111)
GFR calc Af Amer: 32 mL/min — ABNORMAL LOW (ref >60)
GFR, EST NON AFRICAN AMERICAN: 28 mL/min — AB (ref >60)
GLUCOSE: 211 mg/dL — AB (ref 65–99)
Potassium: 4.7 mmol/L (ref 3.5–5.1)
Sodium: 139 mmol/L (ref 135–145)
TOTAL PROTEIN: 7.1 g/dL (ref 6.5–8.1)

## 2015-05-19 LAB — BLOOD GAS, VENOUS
Acid-Base Excess: 10.8 mmol/L — ABNORMAL HIGH (ref 0.0–3.0)
Acid-Base Excess: 9.1 mmol/L — ABNORMAL HIGH (ref 0.0–3.0)
Bicarbonate: 40.9 mEq/L — ABNORMAL HIGH (ref 21.0–28.0)
Bicarbonate: 43 mEq/L — ABNORMAL HIGH (ref 21.0–28.0)
DELIVERY SYSTEMS: POSITIVE
Delivery systems: POSITIVE
FIO2: 0.5
Patient temperature: 37
Patient temperature: 37
pCO2, Ven: 100 mmHg (ref 44.0–60.0)
pCO2, Ven: 105 mmHg (ref 44.0–60.0)
pH, Ven: 7.22 — ABNORMAL LOW (ref 7.320–7.430)
pH, Ven: 7.22 — ABNORMAL LOW (ref 7.320–7.430)

## 2015-05-19 LAB — CBC
HEMATOCRIT: 47.6 % — AB (ref 35.0–47.0)
HEMOGLOBIN: 15.5 g/dL (ref 12.0–16.0)
MCH: 30.2 pg (ref 26.0–34.0)
MCHC: 32.6 g/dL (ref 32.0–36.0)
MCV: 92.7 fL (ref 80.0–100.0)
Platelets: 108 10*3/uL — ABNORMAL LOW (ref 150–440)
RBC: 5.13 MIL/uL (ref 3.80–5.20)
RDW: 18.1 % — ABNORMAL HIGH (ref 11.5–14.5)
WBC: 6.7 10*3/uL (ref 3.6–11.0)

## 2015-05-19 LAB — BLOOD GAS, ARTERIAL
ALLENS TEST (PASS/FAIL): POSITIVE — AB
Acid-Base Excess: 6.7 mmol/L — ABNORMAL HIGH (ref 0.0–3.0)
BICARBONATE: 36.1 meq/L — AB (ref 21.0–28.0)
DELIVERY SYSTEMS: POSITIVE
EXPIRATORY PAP: 6
FIO2: 0.5
INSPIRATORY PAP: 18
O2 Saturation: 87.4 %
PATIENT TEMPERATURE: 37
PH ART: 7.29 — AB (ref 7.350–7.450)
RATE: 18 resp/min
pCO2 arterial: 75 mmHg (ref 32.0–48.0)
pO2, Arterial: 60 mmHg — ABNORMAL LOW (ref 83.0–108.0)

## 2015-05-19 LAB — HEMOGLOBIN A1C: Hgb A1c MFr Bld: 7 % — ABNORMAL HIGH (ref 4.0–6.0)

## 2015-05-19 LAB — MRSA PCR SCREENING: MRSA by PCR: NEGATIVE

## 2015-05-19 LAB — LACTIC ACID, PLASMA
Lactic Acid, Venous: 2.3 mmol/L (ref 0.5–2.0)
Lactic Acid, Venous: 1 mmol/L (ref 0.5–2.0)

## 2015-05-19 LAB — GLUCOSE, CAPILLARY
Glucose-Capillary: 160 mg/dL — ABNORMAL HIGH (ref 65–99)
Glucose-Capillary: 199 mg/dL — ABNORMAL HIGH (ref 65–99)
Glucose-Capillary: 156 mg/dL — ABNORMAL HIGH (ref 65–99)

## 2015-05-19 LAB — BRAIN NATRIURETIC PEPTIDE: B NATRIURETIC PEPTIDE 5: 133 pg/mL — AB (ref 0.0–100.0)

## 2015-05-19 LAB — TROPONIN I

## 2015-05-19 LAB — TSH: TSH: 0.806 u[IU]/mL (ref 0.350–4.500)

## 2015-05-19 MED ORDER — PIPERACILLIN-TAZOBACTAM 3.375 G IVPB
3.3750 g | Freq: Three times a day (TID) | INTRAVENOUS | Status: DC
  Administered 2015-05-19 – 2015-05-23 (×13): 3.375 g via INTRAVENOUS
  Filled 2015-05-19 (×15): qty 50

## 2015-05-19 MED ORDER — METOPROLOL TARTRATE 50 MG PO TABS
100.0000 mg | ORAL_TABLET | Freq: Two times a day (BID) | ORAL | Status: DC
  Administered 2015-05-20 – 2015-05-24 (×8): 100 mg via ORAL
  Filled 2015-05-19 (×9): qty 2

## 2015-05-19 MED ORDER — BUDESONIDE-FORMOTEROL FUMARATE 160-4.5 MCG/ACT IN AERO
2.0000 | INHALATION_SPRAY | Freq: Two times a day (BID) | RESPIRATORY_TRACT | Status: DC
  Administered 2015-05-19: 2 via RESPIRATORY_TRACT
  Filled 2015-05-19: qty 6

## 2015-05-19 MED ORDER — ACETAMINOPHEN 650 MG RE SUPP: 650.0000 mg | Freq: Four times a day (QID) | RECTAL | Status: DC | PRN

## 2015-05-19 MED ORDER — FENTANYL CITRATE (PF) 100 MCG/2ML IJ SOLN
INTRAMUSCULAR | Status: AC
  Filled 2015-05-19: qty 2

## 2015-05-19 MED ORDER — SODIUM CHLORIDE 0.9 % IV SOLN
1250.0000 mg | INTRAVENOUS | Status: AC
  Administered 2015-05-19: 1250 mg via INTRAVENOUS
  Filled 2015-05-19: qty 1250

## 2015-05-19 MED ORDER — COLCHICINE 0.6 MG PO TABS
0.6000 mg | ORAL_TABLET | Freq: Every day | ORAL | Status: DC
  Administered 2015-05-19 – 2015-05-24 (×5): 0.6 mg via ORAL
  Filled 2015-05-19 (×5): qty 1

## 2015-05-19 MED ORDER — LOSARTAN POTASSIUM 50 MG PO TABS
100.0000 mg | ORAL_TABLET | Freq: Every day | ORAL | Status: DC
  Administered 2015-05-19 – 2015-05-24 (×5): 100 mg via ORAL
  Filled 2015-05-19 (×5): qty 2

## 2015-05-19 MED ORDER — INSULIN ASPART 100 UNIT/ML ~~LOC~~ SOLN
0.0000 [IU] | Freq: Three times a day (TID) | SUBCUTANEOUS | Status: DC
  Administered 2015-05-19: 2 [IU] via SUBCUTANEOUS
  Administered 2015-05-19 (×2): 4 [IU] via SUBCUTANEOUS
  Administered 2015-05-20: 6 [IU] via SUBCUTANEOUS
  Filled 2015-05-19: qty 4
  Filled 2015-05-19: qty 2
  Filled 2015-05-19: qty 6
  Filled 2015-05-19: qty 4

## 2015-05-19 MED ORDER — IPRATROPIUM-ALBUTEROL 0.5-2.5 (3) MG/3ML IN SOLN
3.0000 mL | Freq: Once | RESPIRATORY_TRACT | Status: AC
  Administered 2015-05-19: 3 mL via RESPIRATORY_TRACT
  Filled 2015-05-19: qty 3

## 2015-05-19 MED ORDER — METHYLPREDNISOLONE SODIUM SUCC 125 MG IJ SOLR
125.0000 mg | Freq: Once | INTRAMUSCULAR | Status: AC
Start: 2015-05-19 — End: 2015-05-19
  Administered 2015-05-19: 125 mg via INTRAVENOUS

## 2015-05-19 MED ORDER — ALBUTEROL SULFATE (2.5 MG/3ML) 0.083% IN NEBU
INHALATION_SOLUTION | RESPIRATORY_TRACT | Status: AC
  Filled 2015-05-19: qty 3

## 2015-05-19 MED ORDER — ALBUTEROL SULFATE (2.5 MG/3ML) 0.083% IN NEBU
2.5000 mg | INHALATION_SOLUTION | Freq: Once | RESPIRATORY_TRACT | Status: AC
  Administered 2015-05-19: 2.5 mg via RESPIRATORY_TRACT

## 2015-05-19 MED ORDER — VECURONIUM BROMIDE 10 MG IV SOLR
INTRAVENOUS | Status: AC
  Filled 2015-05-19: qty 10

## 2015-05-19 MED ORDER — BUDESONIDE 0.5 MG/2ML IN SUSP: 0.5000 mg | Freq: Two times a day (BID) | RESPIRATORY_TRACT | Status: DC

## 2015-05-19 MED ORDER — ALBUTEROL SULFATE (2.5 MG/3ML) 0.083% IN NEBU
2.5000 mg | INHALATION_SOLUTION | RESPIRATORY_TRACT | Status: DC
  Administered 2015-05-19 – 2015-05-20 (×6): 2.5 mg via RESPIRATORY_TRACT
  Filled 2015-05-19 (×6): qty 3

## 2015-05-19 MED ORDER — ACETAMINOPHEN 325 MG PO TABS
650.0000 mg | ORAL_TABLET | Freq: Four times a day (QID) | ORAL | Status: DC | PRN
  Administered 2015-05-23 – 2015-07-09 (×9): 650 mg via ORAL
  Filled 2015-05-19 (×9): qty 2

## 2015-05-19 MED ORDER — FUROSEMIDE 10 MG/ML IJ SOLN
40.0000 mg | Freq: Two times a day (BID) | INTRAMUSCULAR | Status: DC
  Administered 2015-05-19 – 2015-05-20 (×4): 40 mg via INTRAVENOUS
  Filled 2015-05-19 (×2): qty 4

## 2015-05-19 MED ORDER — FUROSEMIDE 40 MG PO TABS: 80.0000 mg | ORAL_TABLET | Freq: Every day | ORAL | Status: DC

## 2015-05-19 MED ORDER — INSULIN ASPART 100 UNIT/ML ~~LOC~~ SOLN
0.0000 [IU] | Freq: Every day | SUBCUTANEOUS | Status: DC
  Filled 2015-05-19: qty 2

## 2015-05-19 MED ORDER — SODIUM CHLORIDE 0.9 % IV SOLN: INTRAVENOUS | Status: DC

## 2015-05-19 MED ORDER — HEPARIN SODIUM (PORCINE) 5000 UNIT/ML IJ SOLN
5000.0000 [IU] | Freq: Three times a day (TID) | INTRAMUSCULAR | Status: DC
  Administered 2015-05-19 – 2015-05-20 (×3): 5000 [IU] via SUBCUTANEOUS
  Filled 2015-05-19 (×4): qty 1

## 2015-05-19 MED ORDER — PREGABALIN 25 MG PO CAPS
50.0000 mg | ORAL_CAPSULE | Freq: Three times a day (TID) | ORAL | Status: DC
  Administered 2015-05-19 – 2015-05-24 (×15): 50 mg via ORAL
  Filled 2015-05-19 (×15): qty 2

## 2015-05-19 MED ORDER — PRAVASTATIN SODIUM 20 MG PO TABS
10.0000 mg | ORAL_TABLET | Freq: Every day | ORAL | Status: DC
  Administered 2015-05-19 – 2015-05-24 (×5): 10 mg via ORAL
  Filled 2015-05-19: qty 1
  Filled 2015-05-19 (×2): qty 2
  Filled 2015-05-19 (×2): qty 1

## 2015-05-19 MED ORDER — METHYLPREDNISOLONE SODIUM SUCC 125 MG IJ SOLR
INTRAMUSCULAR | Status: AC
  Administered 2015-05-19: 125 mg via INTRAVENOUS
  Filled 2015-05-19: qty 2

## 2015-05-19 MED ORDER — BUDESONIDE 0.5 MG/2ML IN SUSP
0.5000 mg | Freq: Two times a day (BID) | RESPIRATORY_TRACT | Status: DC
  Administered 2015-05-19 – 2015-07-15 (×113): 0.5 mg via RESPIRATORY_TRACT
  Filled 2015-05-19 (×119): qty 2

## 2015-05-19 MED ORDER — ONDANSETRON HCL 4 MG PO TABS: 4.0000 mg | ORAL_TABLET | Freq: Four times a day (QID) | ORAL | Status: DC | PRN

## 2015-05-19 MED ORDER — MIDAZOLAM HCL 2 MG/2ML IJ SOLN
INTRAMUSCULAR | Status: AC
  Filled 2015-05-19: qty 2

## 2015-05-19 MED ORDER — ONDANSETRON HCL 4 MG/2ML IJ SOLN: 4.0000 mg | Freq: Four times a day (QID) | INTRAMUSCULAR | Status: DC | PRN

## 2015-05-19 MED ORDER — VANCOMYCIN HCL 10 G IV SOLR
1250.0000 mg | INTRAVENOUS | Status: DC
  Administered 2015-05-19: 1250 mg via INTRAVENOUS
  Filled 2015-05-19 (×2): qty 1250

## 2015-05-19 MED ORDER — FENTANYL CITRATE (PF) 100 MCG/2ML IJ SOLN: 100.0000 ug | Freq: Once | INTRAMUSCULAR | Status: AC

## 2015-05-19 MED ORDER — CETYLPYRIDINIUM CHLORIDE 0.05 % MT LIQD
7.0000 mL | Freq: Two times a day (BID) | OROMUCOSAL | Status: DC
  Administered 2015-05-19 – 2015-05-22 (×3): 7 mL via OROMUCOSAL

## 2015-05-19 MED ORDER — METHYLPREDNISOLONE SODIUM SUCC 40 MG IJ SOLR
40.0000 mg | Freq: Two times a day (BID) | INTRAMUSCULAR | Status: DC
  Administered 2015-05-19: 40 mg via INTRAVENOUS
  Filled 2015-05-19: qty 1

## 2015-05-19 MED ORDER — METHYLPREDNISOLONE SODIUM SUCC 125 MG IJ SOLR
60.0000 mg | Freq: Three times a day (TID) | INTRAMUSCULAR | Status: DC
  Administered 2015-05-19 – 2015-05-20 (×2): 60 mg via INTRAVENOUS
  Administered 2015-05-20: 125 mg via INTRAVENOUS
  Administered 2015-05-20 – 2015-05-24 (×11): 60 mg via INTRAVENOUS
  Filled 2015-05-19 (×14): qty 2

## 2015-05-19 MED ORDER — MORPHINE SULFATE (PF) 2 MG/ML IV SOLN
2.0000 mg | INTRAVENOUS | Status: DC | PRN
  Administered 2015-05-20 – 2015-05-24 (×3): 2 mg via INTRAVENOUS
  Filled 2015-05-19 (×3): qty 1

## 2015-05-19 MED ORDER — FUROSEMIDE 10 MG/ML IJ SOLN
INTRAMUSCULAR | Status: AC
  Administered 2015-05-19: 40 mg via INTRAVENOUS
  Filled 2015-05-19: qty 4

## 2015-05-19 MED ORDER — CHLORHEXIDINE GLUCONATE 0.12 % MT SOLN
15.0000 mL | Freq: Two times a day (BID) | OROMUCOSAL | Status: DC
  Administered 2015-05-19 – 2015-05-21 (×5): 15 mL via OROMUCOSAL
  Filled 2015-05-19 (×6): qty 15

## 2015-05-19 MED ORDER — ALLOPURINOL 300 MG PO TABS
300.0000 mg | ORAL_TABLET | Freq: Every day | ORAL | Status: DC
  Administered 2015-05-19: 300 mg via ORAL
  Filled 2015-05-19: qty 1

## 2015-05-19 MED ORDER — SODIUM CHLORIDE 0.9 % IV BOLUS (SEPSIS)
1000.0000 mL | Freq: Once | INTRAVENOUS | Status: AC
  Administered 2015-05-19: 1000 mL via INTRAVENOUS

## 2015-05-19 MED ORDER — VECURONIUM BROMIDE 10 MG IV SOLR: 10.0000 mg | Freq: Once | INTRAVENOUS | Status: AC

## 2015-05-19 MED ORDER — TIOTROPIUM BROMIDE MONOHYDRATE 18 MCG IN CAPS
18.0000 ug | ORAL_CAPSULE | Freq: Every day | RESPIRATORY_TRACT | Status: DC
  Filled 2015-05-19: qty 5

## 2015-05-19 MED ORDER — DEXTROSE 5 % IV SOLN
500.0000 mg | INTRAVENOUS | Status: DC
  Administered 2015-05-19: 500 mg via INTRAVENOUS
  Filled 2015-05-19 (×2): qty 500

## 2015-05-19 MED ORDER — MIDAZOLAM HCL 2 MG/2ML IJ SOLN: 2.0000 mg | Freq: Once | INTRAMUSCULAR | Status: AC

## 2015-05-19 MED ORDER — SODIUM CHLORIDE 0.9 % IV SOLN
Freq: Once | INTRAVENOUS | Status: AC
  Administered 2015-05-19: 05:00:00 via INTRAVENOUS

## 2015-05-19 MED ORDER — LINAGLIPTIN 5 MG PO TABS
5.0000 mg | ORAL_TABLET | Freq: Every day | ORAL | Status: DC
  Administered 2015-05-19 – 2015-05-24 (×5): 5 mg via ORAL
  Filled 2015-05-19 (×5): qty 1

## 2015-05-19 MED ORDER — ALBUTEROL SULFATE (2.5 MG/3ML) 0.083% IN NEBU: 2.5000 mg | INHALATION_SOLUTION | Freq: Four times a day (QID) | RESPIRATORY_TRACT | Status: DC | PRN

## 2015-05-19 MED ORDER — INSULIN ASPART 100 UNIT/ML ~~LOC~~ SOLN
0.0000 [IU] | Freq: Three times a day (TID) | SUBCUTANEOUS | Status: DC
  Administered 2015-05-20 – 2015-05-21 (×5): 2 [IU] via SUBCUTANEOUS
  Administered 2015-05-22: 3 [IU] via SUBCUTANEOUS
  Administered 2015-05-22 (×2): 2 [IU] via SUBCUTANEOUS
  Filled 2015-05-19 (×5): qty 2
  Filled 2015-05-19: qty 3
  Filled 2015-05-19: qty 2
  Filled 2015-05-19: qty 5

## 2015-05-19 NOTE — Progress Notes (Signed)
[place] [facility] [place] NAME: Emily Pratt    MR#:  [id number]  DATE OF BIRTH:  [birth year]  SUBJECTIVE:  CHIEF COMPLAINT:  Patient is still short of breath and on BiPAP. Very lethargic during my examination. Husband at bedside  REVIEW OF SYSTEMS:  Review of systems is unobtainable  DRUG ALLERGIES:   Allergies  Allergen Reactions  . Atorvastatin Other (See Comments)    Does not remember why she had Intolerance to Lipitor.  . Fentanyl Itching  . Rosuvastatin Anxiety    Chest tigtness and feeling as if she had heartburn.    VITALS:  Blood pressure 85/52, pulse 74, temperature 98.1 F (36.7 C), temperature source Axillary, resp. rate 19, height '5\' 7"'$  (1.702 m), weight 108 kg (238 lb 1.6 oz), SpO2 92 %.  PHYSICAL EXAMINATION:  GENERAL:  69 y.o.-year-old patient lying in the bed with respiratory distress EYES: Pupils equal, round, reactive to light and accommodation. No scleral icterus.  HEENT: Head atraumatic, normocephalic. Oropharynx and nasopharynx clear.  NECK:  Supple, no jugular venous distention. No thyroid enlargement, no tenderness.  LUNGS: Patient is in respiratory distress with wheezing and rales .Diminished breath sounds bilaterally. On BiPAP CARDIOVASCULAR: S1, S2 normal. No murmurs, rubs, or gallops.  ABDOMEN: Soft, nontender, nondistended. Bowel sounds present. No organomegaly or mass.  EXTREMITIES: No pedal edema, cyanosis, or clubbing.  NEUROLOGIC: Patient is lethargic .responds some to verbal commands  PSYCHIATRIC: The patient is lethargic SKIN: No obvious rash, lesion, or ulcer.    LABORATORY PANEL:   CBC  Recent Labs Lab 05/14/2015 0036  WBC 6.7  HGB 15.5  HCT 47.6*  PLT 108*   ------------------------------------------------------------------------------------------------------------------  Chemistries   Recent Labs Lab 05/20/2015 0036  NA 139  K 4.7  CL 94*  CO2 36*  GLUCOSE 211*  BUN 27*   CREATININE 1.82*  CALCIUM 8.9  AST 24  ALT 13*  ALKPHOS 79  BILITOT 0.8   ------------------------------------------------------------------------------------------------------------------  Cardiac Enzymes  Recent Labs Lab 05/14/2015 0036  TROPONINI <0.03   ------------------------------------------------------------------------------------------------------------------  RADIOLOGY:  Dg Chest Portable 1 View  05/08/2015  CLINICAL DATA:  69 year old female with increasing shortness of breath Coll the today. EXAM: PORTABLE CHEST 1 VIEW COMPARISON:  Chest x-ray 04/24/2015. FINDINGS: Enlarging right pleural effusion. Extensive opacities throughout the right mid to lower lung, which may simply reflect passive atelectasis, however, underlying airspace consolidation or underlying mass is not excluded. Persistent prominence of right paratracheal soft tissue highly concerning for lymphadenopathy. Left lung appears relatively clear, although the left base is poorly visualized medially (likely technique related). No evidence of pulmonary edema. Heart size is normal. Atherosclerotic calcifications in the thoracic aorta. IMPRESSION: 1. Overall, there has been significant progression compared to the prior chest x-ray from 04/24/2015, with enlarging now a large right pleural effusion and worsening aeration throughout the right lung. This is unusual in the setting of a treated pneumonia, and given the presence of paratracheal soft tissue thickening on the right which is suspicious for lymphadenopathy, underlying neoplasm is strongly suspected. Further evaluation with contrast enhanced chest CT in is strongly recommended in the near future to evaluate for underlying neoplasm. 2. Atherosclerosis. Electronically Signed   By: Vinnie Langton M.D.   On: 05/15/2015 01:27    EKG:   Orders placed or performed during the hospital encounter of 04/24/15  . EKG 12-Lead  . EKG 12-Lead  . EKG    ASSESSMENT AND  PLAN:   This is a 69 year old  Caucasian female admitted for acute on chronic respiratory failure with hypercapnia and worsening pleural effusion. 1. Acute on chronic respiratory failure with hypoxia and Hypercapnia.  Continue BiPAP as tolerated, low threshold to intubate if medical situation gets worse Appreciate critical care recommendations Continue nebulizer treatments and IV steroids  We will monitor her blood gases as needed  2. Congestive heart failure: Unspecified type.  Holding her Lasix in view of hypotension Currently patient is on BiPAP which will aid with the diuresis  3. Acute on chronic kidney disease:  The patient may be intravascularly dry. Her BNP is not elevated this time indicating heart failure is not the cause of her current symptoms . We'll gently hydrate the patient with intravenous fluid. This may improve her sodium as well.  4. Essential hypertension: Currently patient is hypotensive Hold  metoprolol and Cozaar  5. Diabetes mellitus type 2: Hold home medications Provide sliding scale insulin  6. Gout:  Currently patient is nothing by mouth 7. DVT prophylaxis: Heparin 8. GI prophylaxis: None    All the records are reviewed and case discussed with Care Management/Social Workerr. Management plans discussed with the patient, husband, RN and Dr. Mortimer Fries pulmonology  CODE STATUS: Full code  TOTAL CRITICAL CARE TIME TAKING CARE OF THIS PATIENT: 35 minutes.   POSSIBLE D/C IN ?  DAYS, DEPENDING ON CLINICAL CONDITION.   Nicholes Mango M.D on 04/27/2015 at 1:32 PM  Between 7am to 6pm - Pager - [phone] After 6pm go to [email or web address] - password [email or web address] [facility] [place]  [facility]  Office  [phone]  CC: Primary care physician; Keith Rake, MD

## 2015-05-19 NOTE — H&P (Addendum)
Emily Pratt is an 69 y.o. female.   Chief Complaint: Shortness of breath HPI: The patient presents via EMS complaining of shortness of breath. She reportedly had oxygen saturations as low as 70% when EMS arrived. The patient was on BiPAP at that time. FiO2 was increased to 100% and oxygen saturations improved to only 79%. In the emergency department patient was immediately initiated on BiPAP. She had progressively become more somnolent. She denied chest pain or fever. Upon my exam in the emergency department the patient indicated by nodding her head that she felt better with BiPAP in place. Chest x-ray showed worsening pleural effusion. Due to respiratory failure emergency department staff called the hospitalist service for admission.  Past Medical History  Diagnosis Date  . Anxiety   . Arthritis   . COPD (chronic obstructive pulmonary disease) ([place])   . CHF (congestive heart failure) ([place])   . Hyperlipidemia   . Hypertension   . Diabetes mellitus without complication ([place])   . Chronic kidney disease   . Neuromuscular disorder [facility])     Past Surgical History  Procedure Laterality Date  . Joint replacement Bilateral 2006    both knees  . Ankle surgery      Family History  Problem Relation Age of Onset  . Diabetes Mother   . Cancer Father   . Diabetes Sister   . Stroke Sister    Social History:  reports that she has been smoking Cigarettes.  She does not have any smokeless tobacco history on file. She reports that she does not drink alcohol or use illicit drugs.  Allergies:  Allergies  Allergen Reactions  . Atorvastatin Other (See Comments)    Does not remember why she had Intolerance to Lipitor.  . Fentanyl Itching  . Rosuvastatin Anxiety    Chest tigtness and feeling as if she had heartburn.    Prior to Admission medications   Medication Sig Start Date End Date Taking? Authorizing Provider  albuterol (PROVENTIL) (2.5 MG/3ML) 0.083% nebulizer solution Take 3 mLs (2.5 mg  total) by nebulization every 6 (six) hours as needed for wheezing or shortness of breath. 05/07/15  Yes Roselee Nova, MD  allopurinol (ZYLOPRIM) 300 MG tablet Take 1 tablet (300 mg total) by mouth daily. 03/06/15  Yes Roselee Nova, MD  budesonide-formoterol [facility]) 160-4.5 MCG/ACT inhaler Inhale 2 puffs into the lungs 2 (two) times daily. 04/24/15  Yes Roselee Nova, MD  colchicine 0.6 MG tablet Take 1 tablet (0.6 mg total) by mouth daily. 03/06/15  Yes Roselee Nova, MD  furosemide (LASIX) 80 MG tablet Take 80 mg by mouth daily.  11/06/14  Yes Historical Provider, MD  JANUVIA 50 MG tablet Take 1 tablet (50 mg total) by mouth daily. 03/06/15  Yes Roselee Nova, MD  losartan (COZAAR) 100 MG tablet Take 1 tablet (100 mg total) by mouth daily. 03/06/15  Yes Roselee Nova, MD  LYRICA 50 MG capsule Take 1 capsule (50 mg total) by mouth 3 (three) times daily. 03/06/15  Yes Roselee Nova, MD  metoprolol (LOPRESSOR) 100 MG tablet Take 1 tablet (100 mg total) by mouth 2 (two) times daily. 03/06/15  Yes Roselee Nova, MD  oxyCODONE-acetaminophen (PERCOCET) 10-325 MG tablet Take 1 tablet by mouth every 6 (six) hours as needed for pain. 05/07/15  Yes Roselee Nova, MD  pravastatin (PRAVACHOL) 10 MG tablet Take 10 mg by mouth daily.   Yes  Historical Provider, MD  tiotropium (SPIRIVA HANDIHALER) 18 MCG inhalation capsule Place 1 capsule (18 mcg total) into inhaler and inhale daily. 05/09/15  Yes Roselee Nova, MD  VENTOLIN HFA 108 (90 BASE) MCG/ACT inhaler Inhale 1 puff into the lungs every 4 (four) hours as needed.  03/25/15  Yes Historical Provider, MD  predniSONE (DELTASONE) 10 MG tablet Take 1 tablet (10 mg total) by mouth daily with breakfast. Patient not taking: Reported on 05/02/2015 04/25/15   Bettey Costa, MD     Results for orders placed or performed during the hospital encounter of 05/03/2015 (from the past 48 hour(s))  CBC     Status: Abnormal   Collection Time: 05/05/2015 12:36  AM  Result Value Ref Range   WBC 6.7 3.6 - 11.0 K/uL   RBC 5.13 3.80 - 5.20 MIL/uL   Hemoglobin 15.5 12.0 - 16.0 g/dL   HCT 47.6 (H) 35.0 - 47.0 %   MCV 92.7 80.0 - 100.0 fL   MCH 30.2 26.0 - 34.0 pg   MCHC 32.6 32.0 - 36.0 g/dL   RDW 18.1 (H) 11.5 - 14.5 %   Platelets 108 (L) 150 - 440 K/uL  Comprehensive metabolic panel     Status: Abnormal   Collection Time: 05/07/2015 12:36 AM  Result Value Ref Range   Sodium 139 135 - 145 mmol/L   Potassium 4.7 3.5 - 5.1 mmol/L   Chloride 94 (L) 101 - 111 mmol/L   CO2 36 (H) 22 - 32 mmol/L   Glucose, Bld 211 (H) 65 - 99 mg/dL   BUN 27 (H) 6 - 20 mg/dL   Creatinine, Ser 1.82 (H) 0.44 - 1.00 mg/dL   Calcium 8.9 8.9 - 10.3 mg/dL   Total Protein 7.1 6.5 - 8.1 g/dL   Albumin 3.4 (L) 3.5 - 5.0 g/dL   AST 24 15 - 41 U/L   ALT 13 (L) 14 - 54 U/L   Alkaline Phosphatase 79 38 - 126 U/L   Total Bilirubin 0.8 0.3 - 1.2 mg/dL   GFR calc non Af Amer 28 (L) >60 mL/min   GFR calc Af Amer 32 (L) >60 mL/min    Comment: (NOTE) The eGFR has been calculated using the CKD EPI equation. This calculation has not been validated in all clinical situations. eGFR's persistently <60 mL/min signify possible Chronic Kidney Disease.    Anion gap 9 5 - 15  Troponin I     Status: None   Collection Time: 05/07/2015 12:36 AM  Result Value Ref Range   Troponin I <0.03 <0.031 ng/mL    Comment:        NO INDICATION OF MYOCARDIAL INJURY.   Brain natriuretic peptide     Status: Abnormal   Collection Time: 05/18/2015 12:36 AM  Result Value Ref Range   B Natriuretic Peptide 133.0 (H) 0.0 - 100.0 pg/mL  Blood gas, venous     Status: Abnormal   Collection Time: 05/04/2015 12:38 AM  Result Value Ref Range   Delivery systems BILEVEL POSITIVE AIRWAY PRESSURE    pH, Ven 7.22 (L) 7.320 - 7.430   pCO2, Ven 105 (HH) 44.0 - 60.0 mmHg    Comment: CRITICAL RESULT, NOTIFIED PHYSICIAN  DR Jeannie Fend 05/08/2015 0047 KRW    Bicarbonate 43.0 (H) 21.0 - 28.0 mEq/L   Acid-Base Excess  10.8 (H) 0.0 - 3.0 mmol/L   Patient temperature 37.0    Collection site RIGHT ANTECUBITAL    Sample type VENOUS   Lactic acid, plasma  Status: Abnormal   Collection Time: 05/26/2015  1:13 AM  Result Value Ref Range   Lactic Acid, Venous 2.3 (HH) 0.5 - 2.0 mmol/L    Comment: CRITICAL RESULT CALLED TO, READ BACK BY AND VERIFIED WITH BRANDY DAVIS AT 0200 05/23/2015.PMH   Dg Chest Portable 1 View  05/17/2015  CLINICAL DATA:  69 year old female with increasing shortness of breath Coll the today. EXAM: PORTABLE CHEST 1 VIEW COMPARISON:  Chest x-ray 04/24/2015. FINDINGS: Enlarging right pleural effusion. Extensive opacities throughout the right mid to lower lung, which may simply reflect passive atelectasis, however, underlying airspace consolidation or underlying mass is not excluded. Persistent prominence of right paratracheal soft tissue highly concerning for lymphadenopathy. Left lung appears relatively clear, although the left base is poorly visualized medially (likely technique related). No evidence of pulmonary edema. Heart size is normal. Atherosclerotic calcifications in the thoracic aorta. IMPRESSION: 1. Overall, there has been significant progression compared to the prior chest x-ray from 04/24/2015, with enlarging now a large right pleural effusion and worsening aeration throughout the right lung. This is unusual in the setting of a treated pneumonia, and given the presence of paratracheal soft tissue thickening on the right which is suspicious for lymphadenopathy, underlying neoplasm is strongly suspected. Further evaluation with contrast enhanced chest CT in is strongly recommended in the near future to evaluate for underlying neoplasm. 2. Atherosclerosis. Electronically Signed   By: Vinnie Langton M.D.   On: 05/08/2015 01:27    Review of Systems  Constitutional: Negative for fever and chills.  HENT: Negative for sore throat and tinnitus.   Eyes: Negative for blurred vision and redness.   Respiratory: Negative for cough and shortness of breath.   Cardiovascular: Negative for chest pain, palpitations, orthopnea and PND.  Gastrointestinal: Negative for nausea, vomiting, abdominal pain and diarrhea.  Genitourinary: Negative for dysuria, urgency and frequency.  Musculoskeletal: Negative for myalgias and joint pain.  Skin: Negative for rash.       No lesions  Neurological: Negative for speech change, focal weakness and weakness.  Endo/Heme/Allergies: Does not bruise/bleed easily.       No temperature intolerance  Psychiatric/Behavioral: Negative for depression and suicidal ideas.    Blood pressure 93/53, pulse 78, temperature 96.6 F (35.9 C), temperature source Axillary, resp. rate 18, height 5' 7"  (1.702 m), weight 108.7 kg (239 lb 10.2 oz), SpO2 92 %. Physical Exam  Nursing note and vitals reviewed. Constitutional: She is oriented to person, place, and time. She appears well-developed and well-nourished. She appears distressed.  HENT:  Head: Normocephalic and atraumatic.  Mouth/Throat: Oropharynx is clear and moist.  Eyes: Conjunctivae and EOM are normal. Pupils are equal, round, and reactive to light. No scleral icterus.  Neck: Normal range of motion. Neck supple. No JVD present. No tracheal deviation present. No thyromegaly present.  Cardiovascular: Normal rate, regular rhythm and normal heart sounds.  Exam reveals no gallop and no friction rub.   No murmur heard. Respiratory: Breath sounds normal. She is in respiratory distress.  GI: Soft. Bowel sounds are normal. She exhibits no distension. There is no tenderness.  Genitourinary:  Deferred  Musculoskeletal: Normal range of motion. She exhibits edema (1+).  Lymphadenopathy:    She has no cervical adenopathy.  Neurological: She is alert and oriented to person, place, and time. No cranial nerve deficit. She exhibits normal muscle tone.  Skin: Skin is warm and dry. No rash noted. No erythema.  Psychiatric: She has a  normal mood and affect. Judgment and thought content  normal.     Assessment/Plan This is a 69 year old Caucasian female admitted for acute on chronic respiratory failure with hypercapnia and worsening pleural effusion. 1. Acute on chronic respiratory failure: Hypercapnia. Continue BiPAP and follow blood gases for improvement. 2. Congestive heart failure: Unspecified type. The patient is on Lasix which one could presume was for systolic heart failure. She does have some trace edema of her lower extremities but no pulmonary edema. Rather she does have a pleural effusion that has grown in size since previous imaging. She  would likely benefit from thoracentesis. Pulmonary consult ordered. 3. Acute on chronic kidney disease: The patient may be intravascularly dry. Her BNP is not elevated this time indicating heart failure is not the cause of her current symptoms. We'll gently hydrate the patient with intravenous fluid. This may improve her sodium as well. 4. Essential hypertension: Continue metoprolol and Cozaar 5. Diabetes mellitus type 2: Continue linagliptin. I have placed the patient on sliding insulin while hospitalized.  6. Gout: Continue allopurinol and colchicine 7. DVT prophylaxis: Heparin 8. GI prophylaxis: None The patient is a full code. Time spent on admission and critical patient care 35 minutes  Harrie Foreman 05/18/2015, 5:41 AM

## 2015-05-19 NOTE — ED Notes (Signed)
Resumed care from [place]; pt sleeping. VS stable. Pt on bi-pap at 50% FiO2.

## 2015-05-19 NOTE — Consult Note (Signed)
[facility] Consultation      Name: Emily Pratt MRN: [id number] DOB: [birth year]    ADMISSION DATE:  04/30/2015  CHIEF COMPLAINT:  Lethargic and resp distress   HISTORY OF PRESENT ILLNESS  69 yo obese white female admitted to ICU for acute resp failure  The patient presents via EMS complaining of shortness of breath. She reportedly had oxygen saturations as low as 70% when EMS arrived. The patient was on BiPAP at that time. FiO2 was increased to 100% and oxygen saturations improved to only 79%. In the emergency department patient was immediately initiated on BiPAP. She had aggressively become more somnolent.  Chest x-ray showed worsening pleural effusion. Patient on biPAP fio2 at 50% now  ABG pending, high risk for intubation    SIGNIFICANT EVENTS   ICU admission 10/24   PAST MEDICAL HISTORY    :  Past Medical History  Diagnosis Date  . Anxiety   . Arthritis   . COPD (chronic obstructive pulmonary disease) ([place])   . CHF (congestive heart failure) ([place])   . Hyperlipidemia   . Hypertension   . Diabetes mellitus without complication ([place])   . Chronic kidney disease   . Neuromuscular disorder [facility])    Past Surgical History  Procedure Laterality Date  . Joint replacement Bilateral 2006    both knees  . Ankle surgery     Prior to Admission medications   Medication Sig Start Date End Date Taking? Authorizing Provider  albuterol (PROVENTIL) (2.5 MG/3ML) 0.083% nebulizer solution Take 3 mLs (2.5 mg total) by nebulization every 6 (six) hours as needed for wheezing or shortness of breath. 05/07/15  Yes Roselee Nova, MD  allopurinol (ZYLOPRIM) 300 MG tablet Take 1 tablet (300 mg total) by mouth daily. 03/06/15  Yes Roselee Nova, MD  budesonide-formoterol [facility]) 160-4.5 MCG/ACT inhaler Inhale 2 puffs into the lungs 2 (two) times daily. 04/24/15  Yes Roselee Nova, MD  colchicine 0.6 MG tablet Take 1 tablet (0.6 mg total) by mouth daily.  03/06/15  Yes Roselee Nova, MD  furosemide (LASIX) 80 MG tablet Take 80 mg by mouth daily.  11/06/14  Yes Historical Provider, MD  JANUVIA 50 MG tablet Take 1 tablet (50 mg total) by mouth daily. 03/06/15  Yes Roselee Nova, MD  losartan (COZAAR) 100 MG tablet Take 1 tablet (100 mg total) by mouth daily. 03/06/15  Yes Roselee Nova, MD  LYRICA 50 MG capsule Take 1 capsule (50 mg total) by mouth 3 (three) times daily. 03/06/15  Yes Roselee Nova, MD  metoprolol (LOPRESSOR) 100 MG tablet Take 1 tablet (100 mg total) by mouth 2 (two) times daily. 03/06/15  Yes Roselee Nova, MD  oxyCODONE-acetaminophen (PERCOCET) 10-325 MG tablet Take 1 tablet by mouth every 6 (six) hours as needed for pain. 05/07/15  Yes Roselee Nova, MD  pravastatin (PRAVACHOL) 10 MG tablet Take 10 mg by mouth daily.   Yes Historical Provider, MD  tiotropium (SPIRIVA HANDIHALER) 18 MCG inhalation capsule Place 1 capsule (18 mcg total) into inhaler and inhale daily. 05/09/15  Yes Roselee Nova, MD  VENTOLIN HFA 108 (90 BASE) MCG/ACT inhaler Inhale 1 puff into the lungs every 4 (four) hours as needed.  03/25/15  Yes Historical Provider, MD  predniSONE (DELTASONE) 10 MG tablet Take 1 tablet (10 mg total) by mouth daily with breakfast. Patient not taking: Reported on 05/15/2015 04/25/15  Bettey Costa, MD   Allergies  Allergen Reactions  . Atorvastatin Other (See Comments)    Does not remember why she had Intolerance to Lipitor.  . Fentanyl Itching  . Rosuvastatin Anxiety    Chest tigtness and feeling as if she had heartburn.     FAMILY HISTORY   Family History  Problem Relation Age of Onset  . Diabetes Mother   . Cancer Father   . Diabetes Sister   . Stroke Sister       SOCIAL HISTORY    reports that she has been smoking Cigarettes.  She does not have any smokeless tobacco history on file. She reports that she does not drink alcohol or use illicit drugs.  Review of Systems  Unable to perform ROS:  critical illness      VITAL SIGNS    Temp:  [96.6 F (35.9 C)-97.5 F (36.4 C)] 97.5 F (36.4 C) (10/24 0900) Pulse Rate:  [71-104] 85 (10/24 0900) Resp:  [9-24] 19 (10/24 0900) BP: (93-136)/(53-80) 108/62 mmHg (10/24 0900) SpO2:  [91 %-96 %] 94 % (10/24 0900) FiO2 (%):  [50 %] 50 % (10/24 0900) Weight:  [238 lb 1.6 oz (108 kg)-239 lb 10.2 oz (108.7 kg)] 238 lb 1.6 oz (108 kg) (10/24 0900) HEMODYNAMICS:   VENTILATOR SETTINGS: Vent Mode:  [-]  FiO2 (%):  [50 %] 50 % INTAKE / OUTPUT: No intake or output data in the 24 hours ending 05/09/2015 1143     PHYSICAL EXAM   Physical Exam  Constitutional: She appears well-developed and well-nourished. She appears distressed.  HENT:  Head: Normocephalic and atraumatic.  Mouth/Throat: No oropharyngeal exudate.  Eyes: EOM are normal. Pupils are equal, round, and reactive to light. No scleral icterus.  Neck: Normal range of motion. Neck supple.  Cardiovascular: Normal rate, regular rhythm and normal heart sounds.   No murmur heard. Pulmonary/Chest: No stridor. She is in respiratory distress. She has wheezes. She has rales.  Abdominal: Soft. Bowel sounds are normal. She exhibits no distension. There is no tenderness. There is no rebound.  Musculoskeletal: Normal range of motion. She exhibits no edema.  Neurological: She displays normal reflexes. Coordination normal.  Lethargic,responds to vocal stimuli   Skin: Skin is warm. She is diaphoretic.  Psychiatric: She has a normal mood and affect.       LABS   Recent Labs Lab 05/09/2015 0036  WBC 6.7  HGB 15.5  HCT 47.6*  PLT 108*   Coag's No results for input(s): APTT, INR in the last 168 hours. BMET  Recent Labs Lab 04/28/2015 0036  NA 139  K 4.7  CL 94*  CO2 36*  BUN 27*  CREATININE 1.82*  GLUCOSE 211*   Electrolytes  Recent Labs Lab 05/15/2015 0036  CALCIUM 8.9   Sepsis Markers  Recent Labs Lab 05/17/2015 0113 05/20/2015 0410  LATICACIDVEN 2.3* 1.0    ABG No results for input(s): PHART, PCO2ART, PO2ART in the last 168 hours. Liver Enzymes  Recent Labs Lab 05/25/2015 0036  AST 24  ALT 13*  ALKPHOS 79  BILITOT 0.8  ALBUMIN 3.4*   Cardiac Enzymes  Recent Labs Lab 05/23/2015 0036  TROPONINI <0.03   Glucose No results for input(s): GLUCAP in the last 168 hours.   No results found for this or any previous visit (from the past 240 hour(s)).   Current facility-administered medications:  .  acetaminophen (TYLENOL) tablet 650 mg, 650 mg, Oral, Q6H PRN **OR** acetaminophen (TYLENOL) suppository 650 mg, 650 mg, Rectal, Q6H  PRN, Harrie Foreman, MD .  albuterol (PROVENTIL) (2.5 MG/3ML) 0.083% nebulizer solution 2.5 mg, 2.5 mg, Nebulization, Q4H, Flora Lipps, MD .  albuterol (PROVENTIL) (2.5 MG/3ML) 0.083% nebulizer solution, , , ,  .  allopurinol (ZYLOPRIM) tablet 300 mg, 300 mg, Oral, Daily, Harrie Foreman, MD, 300 mg at 05/13/2015 1008 .  antiseptic oral rinse (CPC / CETYLPYRIDINIUM CHLORIDE 0.05%) solution 7 mL, 7 mL, Mouth Rinse, q12n4p, Aruna Gouru, MD .  budesonide (PULMICORT) nebulizer solution 0.5 mg, 0.5 mg, Nebulization, BID, Flora Lipps, MD .  budesonide-formoterol (SYMBICORT) 160-4.5 MCG/ACT inhaler 2 puff, 2 puff, Inhalation, BID, Harrie Foreman, MD .  chlorhexidine (PERIDEX) 0.12 % solution 15 mL, 15 mL, Mouth Rinse, BID, Aruna Gouru, MD, 15 mL at 05/02/2015 1008 .  colchicine tablet 0.6 mg, 0.6 mg, Oral, Daily, Harrie Foreman, MD, 0.6 mg at 05/06/2015 1007 .  furosemide (LASIX) injection 40 mg, 40 mg, Intravenous, BID, Nicholes Mango, MD, 40 mg at 05/10/2015 0915 .  heparin injection 5,000 Units, 5,000 Units, Subcutaneous, 3 times per day, Harrie Foreman, MD, 5,000 Units at 05/10/2015 0815 .  insulin aspart (novoLOG) injection 0-12 Units, 0-12 Units, Subcutaneous, TID AC & HS, Flora Lipps, MD .  linagliptin (TRADJENTA) tablet 5 mg, 5 mg, Oral, Daily, Harrie Foreman, MD, 5 mg at 05/07/2015 1006 .  losartan (COZAAR)  tablet 100 mg, 100 mg, Oral, Daily, Harrie Foreman, MD, 100 mg at 04/30/2015 1007 .  methylPREDNISolone sodium succinate (SOLU-MEDROL) 40 mg/mL injection 40 mg, 40 mg, Intravenous, Q12H, Flora Lipps, MD .  metoprolol (LOPRESSOR) tablet 100 mg, 100 mg, Oral, BID, Harrie Foreman, MD, 100 mg at 05/11/2015 1011 .  morphine 2 MG/ML injection 2 mg, 2 mg, Intravenous, Q4H PRN, Harrie Foreman, MD .  ondansetron [facility]) tablet 4 mg, 4 mg, Oral, Q6H PRN **OR** ondansetron (ZOFRAN) injection 4 mg, 4 mg, Intravenous, Q6H PRN, Harrie Foreman, MD .  pravastatin (PRAVACHOL) tablet 10 mg, 10 mg, Oral, Daily, Harrie Foreman, MD, 10 mg at 05/09/2015 1006 .  pregabalin (LYRICA) capsule 50 mg, 50 mg, Oral, TID, Harrie Foreman, MD, 50 mg at 05/18/2015 1007 .  tiotropium (SPIRIVA) inhalation capsule 18 mcg, 18 mcg, Inhalation, Daily, Harrie Foreman, MD  IMAGING    Dg Chest Portable 1 View  05/16/2015  CLINICAL DATA:  69 year old female with increasing shortness of breath Coll the today. EXAM: PORTABLE CHEST 1 VIEW COMPARISON:  Chest x-ray 04/24/2015. FINDINGS: Enlarging right pleural effusion. Extensive opacities throughout the right mid to lower lung, which may simply reflect passive atelectasis, however, underlying airspace consolidation or underlying mass is not excluded. Persistent prominence of right paratracheal soft tissue highly concerning for lymphadenopathy. Left lung appears relatively clear, although the left base is poorly visualized medially (likely technique related). No evidence of pulmonary edema. Heart size is normal. Atherosclerotic calcifications in the thoracic aorta. IMPRESSION: 1. Overall, there has been significant progression compared to the prior chest x-ray from 04/24/2015, with enlarging now a large right pleural effusion and worsening aeration throughout the right lung. This is unusual in the setting of a treated pneumonia, and given the presence of paratracheal soft tissue  thickening on the right which is suspicious for lymphadenopathy, underlying neoplasm is strongly suspected. Further evaluation with contrast enhanced chest CT in is strongly recommended in the near future to evaluate for underlying neoplasm. 2. Atherosclerosis. Electronically Signed   By: Vinnie Langton M.D.   On: 05/16/2015 01:27  MAJOR EVENTS/TEST RESULTS: CXR as reported above   INDWELLING DEVICES::  MICRO DATA: MRSA PCR >> Urine  Blood Resp   ANTIMICROBIALS:  Zosyn/vancomycin 10/24    ASSESSMENT/PLAN   69 yo obese white female admitted to ICU for acute hypercapnic and hypoxic resp failure acute COPD and CHF exacerbation from probable pneumonia  PULMONARY-high risk for intubation -Respiratory Failure -continue biPAP as tolerated -continue Bronchodilator Therapy -COPD  And CHF exacerbation -BD therapy, IV sterooids -repeat ABG pending  CARDIOVASCULAR Needs ICU monitoring -not on vasopressors  RENAL Foley cath, watch UO -lasix as tolerated  GASTROINTESTINAL Keep NPO for now  HEMATOLOGIC Follow CBC  INFECTIOUS Empiric abx for pneumonia -start vanc/sozyn  ENDOCRINE - ICU hypoglycemic\Hyperglycemia protocol   NEUROLOGIC Lethargic,somnelent -high risk for intubation   I have personally obtained a history, examined the patient, evaluated laboratory and independently reviewed  imaging results, formulated the assessment and plan and placed orders.  The Patient requires high complexity decision making for assessment and support, frequent evaluation and titration of therapies, application of advanced monitoring technologies and extensive interpretation of multiple databases. Critical Care Time devoted to patient care services described in this note is 45 minutes.   Overall, patient is critically ill, prognosis is guarded. Patient at high risk for cardiac arrest and death.    Corrin Parker, M.D.  [facility]  Medical  Director [place] Director [facility] Cardio-Pulmonary Department

## 2015-05-19 NOTE — Progress Notes (Signed)
Pt arrived to CCU from ED requiring BiPAP. Pt currently on 50% with some dyspnea noted. Pt with exp wheezes and rhonchi throughout. Pt receiving IV diuretics and foley in place for accurate monitoring. Last ABG results below, Md talked with family and pt about possibility of intubation if no improvement over night. Pt refused intubation at this time with MD and family present. Pt reporting no pain and VS WNL. Will continue to monitor.   Results for Emily Pratt, Emily Pratt (MRN [id number]) as of 05/23/2015 17:34  Ref. Range 05/26/2015 12:15  Sample type Unknown ARTERIAL DRAW  Delivery systems Unknown BILEVEL POSITIVE .Marland KitchenMarland Kitchen  FIO2 Unknown 0.50  Inspiratory PAP Unknown 18  Expiratory PAP Unknown 6  pH, Arterial Latest Ref Range: 7.350-7.450  7.29 (L)  pCO2 arterial Latest Ref Range: 32.0-48.0 mmHg 75 (HH)  pO2, Arterial Latest Ref Range: 83.0-108.0 mmHg 60 (L)

## 2015-05-19 NOTE — ED Notes (Signed)
Per ems and family pt has been having increased sob all day, pt non verbal at this time due to sob and wearing the cpap on arrival, pt denies pain at this time

## 2015-05-19 NOTE — ED Notes (Signed)
Pt's belongings inventoried with [place] Vandyke RN, pt has $501.39 along with a back card with the name [employer] that is sealed in a belongings envelope sent to security. Pt has a bag of medications that include lyrica, xanax, and oxycodone that also is inventoried and sent to pharmacy. Pt has a set of dentures, upper and lower that are placed in a belongings bag with her orange colored shirt and a purple flowered purse. 2 silvertone necklaces, 2 silvertone rings with stones remain on patient.

## 2015-05-19 NOTE — ED Provider Notes (Signed)
[facility] Emergency Department Provider Note  ____________________________________________  Time seen: Approximately 00:27 AM  I have reviewed the triage vital signs and the nursing notes.   HISTORY  Chief Complaint Respiratory Distress  Patient in severe respiratory distress  HPI Emily Pratt is a 69 y.o. female who comes into the hospital by EMS with shortness of breath. According to EMS the patient has been short of breath all day. They report that when they arrived to her house she was on CPAP and had oxygen saturation level of 71%. They report that they were able to put her on CPAP and she was up to 79% as the highest number. The patient takes Lasix 80 mg twice a day and according to EMS she sounds like she is full of fluid. They report that the patient was unable to answer questions and she does have some respiratory distress here in the emergency department.   Past Medical History  Diagnosis Date  . Anxiety   . Arthritis   . COPD (chronic obstructive pulmonary disease) ([place])   . CHF (congestive heart failure) ([place])   . Hyperlipidemia   . Hypertension   . Diabetes mellitus without complication ([place])   . Chronic kidney disease   . Neuromuscular disorder [facility])     Patient Active Problem List   Diagnosis Date Noted  . CAP (community acquired pneumonia) 04/24/2015  . CCF (congestive cardiac failure) ([place]) 04/10/2015  . Long term current use of opiate analgesic 04/10/2015  . Affective disorder, major ([place]) 04/10/2015  . Low back pain with sciatica 04/10/2015  . Menopause 04/10/2015  . Edema, peripheral 04/10/2015  . Mechanical and motor problems with internal organs 04/10/2015  . Avitaminosis D 04/10/2015  . COPD (chronic obstructive pulmonary disease) ([place]) 03/06/2015  . Hyperlipidemia 03/06/2015  . Chronic gout of foot 02/06/2015  . Current smoker 01/10/2015  . At risk for falling 01/10/2015  . Callus of foot 01/10/2015  . Anxiety  01/10/2015  . Arthralgia of hip 01/10/2015  . Leg pain 01/10/2015  . Chronic pain associated with significant psychosocial dysfunction 01/10/2015  . Chronic low back pain without sciatica 01/10/2015  . Continuous opioid dependence ([place]) 01/10/2015  . Chronic kidney disease (CKD), stage III (moderate) 01/10/2015  . CAFL (chronic airflow limitation) ([place]) 01/10/2015  . Diabetes mellitus type 2 in obese ([place]) 01/10/2015  . Diabetic peripheral neuropathy ([place]) 01/10/2015  . Abnormal serum level of alkaline phosphatase 01/10/2015  . Acid reflux 01/10/2015  . Cannot sleep 01/10/2015  . BP (high blood pressure) 01/10/2015  . PND (post-nasal drip) 01/10/2015  . Lumbar canal stenosis 01/10/2015  . Chronic left hip pain 01/10/2015  . Bacterial pharyngitis 01/10/2015  . H/O viral illness 06/22/2011  . H/O atrial flutter 02/07/2007  . Hypoxia 10/28/2005    Past Surgical History  Procedure Laterality Date  . Joint replacement Bilateral 2006    both knees  . Ankle surgery      Current Outpatient Rx  Name  Route  Sig  Dispense  Refill  . albuterol (PROVENTIL) (2.5 MG/3ML) 0.083% nebulizer solution   Nebulization   Take 3 mLs (2.5 mg total) by nebulization every 6 (six) hours as needed for wheezing or shortness of breath.   150 mL   1   . allopurinol (ZYLOPRIM) 300 MG tablet   Oral   Take 1 tablet (300 mg total) by mouth daily.   90 tablet   0   . budesonide-formoterol (SYMBICORT) 160-4.5 MCG/ACT inhaler  Inhalation   Inhale 2 puffs into the lungs 2 (two) times daily.   1 Inhaler   3   . colchicine 0.6 MG tablet   Oral   Take 1 tablet (0.6 mg total) by mouth daily.   90 tablet   0   . furosemide (LASIX) 80 MG tablet   Oral   Take 80 mg by mouth daily.          Marland Kitchen JANUVIA 50 MG tablet   Oral   Take 1 tablet (50 mg total) by mouth daily.   90 tablet   0     Dispense as written.   Marland Kitchen losartan (COZAAR) 100 MG tablet   Oral   Take 1 tablet (100 mg total) by mouth  daily.   90 tablet   0   . LYRICA 50 MG capsule   Oral   Take 1 capsule (50 mg total) by mouth 3 (three) times daily.   90 capsule   2     Dispense as written.   . metoprolol (LOPRESSOR) 100 MG tablet   Oral   Take 1 tablet (100 mg total) by mouth 2 (two) times daily.   180 tablet   0   . oxyCODONE-acetaminophen (PERCOCET) 10-325 MG tablet   Oral   Take 1 tablet by mouth every 6 (six) hours as needed for pain.   120 tablet   0   . pravastatin (PRAVACHOL) 10 MG tablet   Oral   Take 10 mg by mouth daily.         Marland Kitchen tiotropium (SPIRIVA HANDIHALER) 18 MCG inhalation capsule   Inhalation   Place 1 capsule (18 mcg total) into inhaler and inhale daily.   90 capsule   0   . VENTOLIN HFA 108 (90 BASE) MCG/ACT inhaler   Inhalation   Inhale 1 puff into the lungs every 4 (four) hours as needed.            Dispense as written.   . predniSONE (DELTASONE) 10 MG tablet   Oral   Take 1 tablet (10 mg total) by mouth daily with breakfast. Patient not taking: Reported on 05/11/2015   30 tablet   0     Label  & dispense according to the schedule below: ...     Allergies Atorvastatin; Fentanyl; and Rosuvastatin  Family History  Problem Relation Age of Onset  . Diabetes Mother   . Cancer Father   . Diabetes Sister   . Stroke Sister     Social History Social History  Substance Use Topics  . Smoking status: Current Every Day Smoker    Types: Cigarettes  . Smokeless tobacco: None     Comment: 0.5 pack /day  . Alcohol Use: No    Review of Systems Constitutional: No fever/chills Eyes: No visual changes. ENT: No sore throat. Cardiovascular: Denies chest pain. Respiratory: shortness of breath. Gastrointestinal: No abdominal pain.  No nausea, no vomiting.   Genitourinary: Negative for dysuria. Musculoskeletal: Negative for back pain. Skin: Negative for rash. Neurological: Negative for headaches, Lymph: leg swelling  10-point ROS otherwise  negative.  ____________________________________________   PHYSICAL EXAM:  VITAL SIGNS: ED Triage Vitals  Enc Vitals Group     BP 05/08/2015 0029 101/80 mmHg     Pulse Rate 05/24/2015 0029 104     Resp 05/11/2015 0029 24     Temp --      Temp src --      SpO2 05/01/2015 0029 96 %  Weight 05/02/2015 0029 239 lb 10.2 oz (108.7 kg)     Height 05/12/2015 0029 '5\' 7"'$  (1.702 m)     Head Cir --      Peak Flow --      Pain Score --      Pain Loc --      Pain Edu? --      Excl. in [place]? --     Constitutional: Alert and oriented. ill appearing and in severe respiratory distress. Eyes: Conjunctivae are normal. PERRL. EOMI. Head: Atraumatic. Nose: No congestion/rhinnorhea. Mouth/Throat: Mucous membranes are moist.  Oropharynx non-erythematous. Cardiovascular: Normal rate, regular rhythm. Grossly normal heart sounds.  Good peripheral circulation. Respiratory: increased respiratory effort.  With retractions. Tight expiratory wheezes throughout. Gastrointestinal: Soft and nontender. No distention. Positive bowel sounds Musculoskeletal: Bilateral lower extremity edema Neurologic:  Normal speech and language. No gross focal neurologic deficits are appreciated.  Skin:  Skin is warm, dry and intact. Psychiatric: Mood and affect are normal.   ____________________________________________   LABS (all labs ordered are listed, but only abnormal results are displayed)  Labs Reviewed  CBC - Abnormal; Notable for the following:    HCT 47.6 (*)    RDW 18.1 (*)    Platelets 108 (*)    All other components within normal limits  COMPREHENSIVE METABOLIC PANEL - Abnormal; Notable for the following:    Chloride 94 (*)    CO2 36 (*)    Glucose, Bld 211 (*)    BUN 27 (*)    Creatinine, Ser 1.82 (*)    Albumin 3.4 (*)    ALT 13 (*)    GFR calc non Af Amer 28 (*)    GFR calc Af Amer 32 (*)    All other components within normal limits  BLOOD GAS, VENOUS - Abnormal; Notable for the following:    pH, Ven  7.22 (*)    pCO2, Ven 105 (*)    Bicarbonate 43.0 (*)    Acid-Base Excess 10.8 (*)    All other components within normal limits  BRAIN NATRIURETIC PEPTIDE - Abnormal; Notable for the following:    B Natriuretic Peptide 133.0 (*)    All other components within normal limits  LACTIC ACID, PLASMA - Abnormal; Notable for the following:    Lactic Acid, Venous 2.3 (*)    All other components within normal limits  CULTURE, BLOOD (ROUTINE X 2)  CULTURE, BLOOD (ROUTINE X 2)  TROPONIN I  BLOOD GAS, VENOUS  LACTIC ACID, PLASMA  BLOOD GAS, VENOUS   ____________________________________________  EKG  ED ECG REPORT I, Loney Hering, the attending physician, personally viewed and interpreted this ECG.   Date: 05/11/2015  EKG Time: 0035  Rate: 100  Rhythm: sinus tachycardia, LBBB  Axis: left axis  Intervals:left bundle branch block seen on EKG 03/2015  ST&T Change: LBBB  ____________________________________________  RADIOLOGY  CXR: ____________________________________________   PROCEDURES  Procedure(s) performed: None  Critical Care performed: Yes, see critical care note(s)  CRITICAL CARE Performed by: Charlesetta Ivory P   Total critical care time: 60 min  Critical care time was exclusive of separately billable procedures and treating other patients.  Critical care was necessary to treat or prevent imminent or life-threatening deterioration.  Critical care was time spent personally by me on the following activities: development of treatment plan with patient and/or surrogate as well as nursing, discussions with consultants, evaluation of patient's response to treatment, examination of patient, obtaining history from patient or surrogate, ordering  and performing treatments and interventions, ordering and review of laboratory studies, ordering and review of radiographic studies, pulse oximetry and re-evaluation of patient's condition.   ____________________________________________   INITIAL IMPRESSION / ASSESSMENT AND PLAN / ED COURSE  Pertinent labs & imaging results that were available during my care of the patient were reviewed by me and considered in my medical decision making (see chart for details).  This is a 69 year old female with a history of COPD and CHF who comes in today with some shortness of breath. Upon listening to the patient's chest it sounds as though she is having wheezing and her symptoms are more consistent with a COPD exacerbation. The patient was placed on BiPAP when she immediately arrived to the emergency department and had oxygen saturations in the 90s. The patient did receive an emergent VBG and was found to have a CO2 of 105. The patient did have some decreased blood pressure after the BiPAP so she will receive 500 ML's of normal saline and we will repeat the patient's VBG in an hour to determine if the BiPAP was helping decrease her CO2 level. The patient received 2 DuoNeb's and will receive some Solu-Medrol as well.  The patient continued on BiPAP with an initial repeat VBG CO2 of 100. We increased the patient's pressure at that time and her CO2 on the third VBG was 94. The patient did not have any worsened mental status. The patient's respiratory rate continued to be 18. At this time I do not feel the patient needs immediate intubation. I did not give the patient magnesium sulfate due to her blood pressure being low. The patient's blood pressure did improve with 800 ML's of normal saline. The patient is not febrile nor does she endorse cough or any other illness. I will hold off on antibiotics at this time. The patient will be admitted to the hospitalist service. ____________________________________________   FINAL CLINICAL IMPRESSION(S) / ED DIAGNOSES  Final diagnoses:  COPD with acute exacerbation ([place])  Pleural effusion      Loney Hering, MD 05/01/2015 [phone]

## 2015-05-19 NOTE — Progress Notes (Signed)
ANTIBIOTIC CONSULT NOTE - INITIAL  Pharmacy Consult for Vancomycin/Zosyn Indication: pneumonia  Allergies  Allergen Reactions  . Atorvastatin Other (See Comments)    Does not remember why she had Intolerance to Lipitor.  . Fentanyl Itching  . Rosuvastatin Anxiety    Chest tigtness and feeling as if she had heartburn.    Patient Measurements: Height: '5\' 7"'$  (170.2 cm) Weight: 238 lb 1.6 oz (108 kg) IBW/kg (Calculated) : 61.6 Adjusted Body Weight: 80.2 kg  Vital Signs: Temp: 97.5 F (36.4 C) (10/24 0900) Temp Source: Axillary (10/24 0900) BP: 108/62 mmHg (10/24 0900) Pulse Rate: 85 (10/24 0900) Intake/Output from previous day:   Intake/Output from this shift:    Labs:  Recent Labs  05/07/2015 0036  WBC 6.7  HGB 15.5  PLT 108*  CREATININE 1.82*   Estimated Creatinine Clearance: 38 mL/min (by C-G formula based on Cr of 1.82). No results for input(s): VANCOTROUGH, VANCOPEAK, VANCORANDOM, GENTTROUGH, GENTPEAK, GENTRANDOM, TOBRATROUGH, TOBRAPEAK, TOBRARND, AMIKACINPEAK, AMIKACINTROU, AMIKACIN in the last 72 hours.   Microbiology: Recent Results (from the past 720 hour(s))  Blood culture (routine x 2)     Status: None   Collection Time: 04/24/15  6:31 PM  Result Value Ref Range Status   Specimen Description BLOOD LEFT ASSIST CONTROL  Final   Special Requests BOTTLES DRAWN AEROBIC AND ANAEROBIC  4CC  Final   Culture NO GROWTH 6 DAYS  Final   Report Status 04/30/2015 FINAL  Final  Blood culture (routine x 2)     Status: None   Collection Time: 04/24/15  6:31 PM  Result Value Ref Range Status   Specimen Description BLOOD LEFT HAND  Final   Special Requests BOTTLES DRAWN AEROBIC AND ANAEROBIC  2CC  Final   Culture NO GROWTH 6 DAYS  Final   Report Status 04/30/2015 FINAL  Final  MRSA PCR Screening     Status: None   Collection Time: 05/15/2015 12:28 AM  Result Value Ref Range Status   MRSA by PCR NEGATIVE NEGATIVE Final    Comment:        The GeneXpert MRSA Assay  (FDA approved for NASAL specimens only), is one component of a comprehensive MRSA colonization surveillance program. It is not intended to diagnose MRSA infection nor to guide or monitor treatment for MRSA infections.     Medical History: Past Medical History  Diagnosis Date  . Anxiety   . Arthritis   . COPD (chronic obstructive pulmonary disease) ([place])   . CHF (congestive heart failure) ([place])   . Hyperlipidemia   . Hypertension   . Diabetes mellitus without complication ([place])   . Chronic kidney disease   . Neuromuscular disorder ([facility])     Medications:  Scheduled:  . albuterol  2.5 mg Nebulization Q4H  . albuterol      . allopurinol  300 mg Oral Daily  . antiseptic oral rinse  7 mL Mouth Rinse q12n4p  . budesonide (PULMICORT) nebulizer solution  0.5 mg Nebulization BID  . budesonide-formoterol  2 puff Inhalation BID  . chlorhexidine  15 mL Mouth Rinse BID  . colchicine  0.6 mg Oral Daily  . furosemide  40 mg Intravenous BID  . heparin  5,000 Units Subcutaneous 3 times per day  . insulin aspart  0-12 Units Subcutaneous TID AC & HS  . linagliptin  5 mg Oral Daily  . losartan  100 mg Oral Daily  . methylPREDNISolone (SOLU-MEDROL) injection  40 mg Intravenous Q12H  . metoprolol  100 mg Oral  BID  . piperacillin-tazobactam (ZOSYN)  IV  3.375 g Intravenous 3 times per day  . pravastatin  10 mg Oral Daily  . pregabalin  50 mg Oral TID  . tiotropium  18 mcg Inhalation Daily  . vancomycin  1,250 mg Intravenous STAT  . vancomycin  1,250 mg Intravenous Q24H   Infusions:   Assessment: 69 y/o F with acute respiratory failure due to COPD and CHF exacerbation and possible PNA.   Ke: 0.036 h-1 Vd: 56.1 L   Goal of Therapy:  Vancomycin trough level 15-20 mcg/ml  Plan:  Will begin vancomycin 1250 mg iv q 24 hours with stacked dosing and a trough with the 4th dose (5th total dose). Will order Zosyn 3.375 g EI q 8 hours. Will f/u renal function and culture results.   Ulice Dash D 05/18/2015,12:22 PM

## 2015-05-20 ENCOUNTER — Inpatient Hospital Stay: Payer: Medicare Other

## 2015-05-20 DIAGNOSIS — R918 Other nonspecific abnormal finding of lung field: Secondary | ICD-10-CM | POA: Insufficient documentation

## 2015-05-20 DIAGNOSIS — J9692 Respiratory failure, unspecified with hypercapnia: Secondary | ICD-10-CM

## 2015-05-20 DIAGNOSIS — J9602 Acute respiratory failure with hypercapnia: Secondary | ICD-10-CM

## 2015-05-20 DIAGNOSIS — R590 Localized enlarged lymph nodes: Secondary | ICD-10-CM

## 2015-05-20 DIAGNOSIS — J441 Chronic obstructive pulmonary disease with (acute) exacerbation: Secondary | ICD-10-CM | POA: Insufficient documentation

## 2015-05-20 DIAGNOSIS — J90 Pleural effusion, not elsewhere classified: Secondary | ICD-10-CM

## 2015-05-20 DIAGNOSIS — J9601 Acute respiratory failure with hypoxia: Secondary | ICD-10-CM | POA: Insufficient documentation

## 2015-05-20 DIAGNOSIS — R0600 Dyspnea, unspecified: Secondary | ICD-10-CM

## 2015-05-20 LAB — BLOOD GAS, ARTERIAL
ACID-BASE EXCESS: 6.9 mmol/L — AB (ref 0.0–3.0)
ACID-BASE EXCESS: 6.9 mmol/L — AB (ref 0.0–3.0)
Allens test (pass/fail): POSITIVE — AB
Allens test (pass/fail): POSITIVE — AB
BICARBONATE: 32 meq/L — AB (ref 21.0–28.0)
Bicarbonate: 34.1 mEq/L — ABNORMAL HIGH (ref 21.0–28.0)
Delivery systems: POSITIVE
EXPIRATORY PAP: 6
FIO2: 0.55
FIO2: 0.8
Inspiratory PAP: 18
LHR: 16 {breaths}/min
O2 SAT: 92.5 %
O2 SAT: 90.6 %
PATIENT TEMPERATURE: 37
PCO2 ART: 46 mmHg (ref 32.0–48.0)
PCO2 ART: 59 mmHg — AB (ref 32.0–48.0)
PEEP/CPAP: 5 cmH2O
PH ART: 7.45 (ref 7.350–7.450)
PH ART: 7.37 (ref 7.350–7.450)
PO2 ART: 62 mmHg — AB (ref 83.0–108.0)
Patient temperature: 37
VT: 500 mL
pO2, Arterial: 62 mmHg — ABNORMAL LOW (ref 83.0–108.0)

## 2015-05-20 LAB — GLUCOSE, CAPILLARY
Glucose-Capillary: 201 mg/dL — ABNORMAL HIGH (ref 65–99)
Glucose-Capillary: 166 mg/dL — ABNORMAL HIGH (ref 65–99)
Glucose-Capillary: 167 mg/dL — ABNORMAL HIGH (ref 65–99)
Glucose-Capillary: 190 mg/dL — ABNORMAL HIGH (ref 65–99)

## 2015-05-20 LAB — BASIC METABOLIC PANEL
ANION GAP: 10 (ref 5–15)
Anion gap: 10 (ref 5–15)
BUN: 39 mg/dL — AB (ref 6–20)
BUN: 43 mg/dL — ABNORMAL HIGH (ref 6–20)
CHLORIDE: 99 mmol/L — AB (ref 101–111)
CO2: 30 mmol/L (ref 22–32)
CO2: 29 mmol/L (ref 22–32)
CREATININE: 2.14 mg/dL — AB (ref 0.44–1.00)
CREATININE: 2.1 mg/dL — AB (ref 0.44–1.00)
Calcium: 8.4 mg/dL — ABNORMAL LOW (ref 8.9–10.3)
Calcium: 8.2 mg/dL — ABNORMAL LOW (ref 8.9–10.3)
Chloride: 99 mmol/L — ABNORMAL LOW (ref 101–111)
GFR calc Af Amer: 26 mL/min — ABNORMAL LOW (ref >60)
GFR calc non Af Amer: 23 mL/min — ABNORMAL LOW (ref >60)
GFR calc non Af Amer: 23 mL/min — ABNORMAL LOW (ref >60)
GFR, EST AFRICAN AMERICAN: 27 mL/min — AB (ref >60)
GLUCOSE: 188 mg/dL — AB (ref 65–99)
Glucose, Bld: 178 mg/dL — ABNORMAL HIGH (ref 65–99)
POTASSIUM: 4.1 mmol/L (ref 3.5–5.1)
POTASSIUM: 3.7 mmol/L (ref 3.5–5.1)
SODIUM: 139 mmol/L (ref 135–145)
SODIUM: 138 mmol/L (ref 135–145)

## 2015-05-20 LAB — CBC
HCT: 40 % (ref 35.0–47.0)
HEMOGLOBIN: 12.9 g/dL (ref 12.0–16.0)
MCH: 30 pg (ref 26.0–34.0)
MCHC: 32.4 g/dL (ref 32.0–36.0)
MCV: 92.7 fL (ref 80.0–100.0)
Platelets: 91 10*3/uL — ABNORMAL LOW (ref 150–440)
RBC: 4.31 MIL/uL (ref 3.80–5.20)
RDW: 18.1 % — ABNORMAL HIGH (ref 11.5–14.5)
WBC: 4.7 10*3/uL (ref 3.6–11.0)

## 2015-05-20 LAB — EXPECTORATED SPUTUM ASSESSMENT W GRAM STAIN, RFLX TO RESP C

## 2015-05-20 LAB — TRIGLYCERIDES: Triglycerides: 169 mg/dL — ABNORMAL HIGH (ref <150)

## 2015-05-20 LAB — EXPECTORATED SPUTUM ASSESSMENT W REFEX TO RESP CULTURE

## 2015-05-20 MED ORDER — MIDAZOLAM HCL 2 MG/2ML IJ SOLN
INTRAMUSCULAR | Status: AC
  Administered 2015-05-20: 2 mg via INTRAVENOUS
  Filled 2015-05-20: qty 2

## 2015-05-20 MED ORDER — PROPOFOL 1000 MG/100ML IV EMUL
INTRAVENOUS | Status: AC
  Filled 2015-05-20: qty 100

## 2015-05-20 MED ORDER — AZITHROMYCIN 500 MG IV SOLR
500.0000 mg | INTRAVENOUS | Status: DC
  Administered 2015-05-20 – 2015-05-22 (×3): 500 mg via INTRAVENOUS
  Filled 2015-05-20 (×4): qty 500

## 2015-05-20 MED ORDER — MIDAZOLAM HCL 2 MG/2ML IJ SOLN: 2.0000 mg | Freq: Once | INTRAMUSCULAR | Status: DC

## 2015-05-20 MED ORDER — VECURONIUM BROMIDE 10 MG IV SOLR
INTRAVENOUS | Status: AC
  Administered 2015-05-20: 10 mg via INTRAVENOUS
  Filled 2015-05-20: qty 10

## 2015-05-20 MED ORDER — DOCUSATE SODIUM 50 MG/5ML PO LIQD: 100.0000 mg | Freq: Two times a day (BID) | ORAL | Status: DC | PRN

## 2015-05-20 MED ORDER — MIDAZOLAM HCL 2 MG/2ML IJ SOLN
1.0000 mg | INTRAMUSCULAR | Status: DC | PRN
  Administered 2015-05-22: 1 mg via INTRAVENOUS
  Filled 2015-05-20: qty 2

## 2015-05-20 MED ORDER — FENTANYL CITRATE (PF) 100 MCG/2ML IJ SOLN
INTRAMUSCULAR | Status: AC
  Administered 2015-05-20: 100 ug via INTRAVENOUS
  Filled 2015-05-20: qty 2

## 2015-05-20 MED ORDER — MIDAZOLAM HCL 2 MG/2ML IJ SOLN
4.0000 mg | Freq: Once | INTRAMUSCULAR | Status: AC
  Administered 2015-05-20 (×2): 2 mg via INTRAVENOUS

## 2015-05-20 MED ORDER — FENTANYL CITRATE (PF) 100 MCG/2ML IJ SOLN
200.0000 ug | Freq: Once | INTRAMUSCULAR | Status: AC
  Administered 2015-05-20: 100 ug via INTRAVENOUS

## 2015-05-20 MED ORDER — IPRATROPIUM-ALBUTEROL 0.5-2.5 (3) MG/3ML IN SOLN: 3.0000 mL | RESPIRATORY_TRACT | Status: AC | PRN

## 2015-05-20 MED ORDER — PROPOFOL 1000 MG/100ML IV EMUL
0.0000 ug/kg/min | INTRAVENOUS | Status: DC
  Administered 2015-05-20: 13:00:00 via INTRAVENOUS
  Administered 2015-05-20: 50 ug/kg/min via INTRAVENOUS
  Administered 2015-05-20: 40 ug/kg/min via INTRAVENOUS
  Administered 2015-05-20: 80 ug/kg/min via INTRAVENOUS
  Administered 2015-05-21: 30 ug/kg/min via INTRAVENOUS
  Administered 2015-05-21: 45 ug/kg/min via INTRAVENOUS
  Administered 2015-05-21 (×2): 50 ug/kg/min via INTRAVENOUS
  Administered 2015-05-21: 45 ug/kg/min via INTRAVENOUS
  Administered 2015-05-21: 50 ug/kg/min via INTRAVENOUS
  Administered 2015-05-22: 10 ug/kg/min via INTRAVENOUS
  Administered 2015-05-22: 45 ug/kg/min via INTRAVENOUS
  Filled 2015-05-20 (×12): qty 100

## 2015-05-20 MED ORDER — ANTISEPTIC ORAL RINSE SOLUTION (CORINZ)
7.0000 mL | Freq: Four times a day (QID) | OROMUCOSAL | Status: DC
  Administered 2015-05-20 – 2015-06-09 (×70): 7 mL via OROMUCOSAL
  Filled 2015-05-20 (×59): qty 7

## 2015-05-20 MED ORDER — VECURONIUM BROMIDE 10 MG IV SOLR
10.0000 mg | Freq: Once | INTRAVENOUS | Status: AC
  Administered 2015-05-20: 10 mg via INTRAVENOUS

## 2015-05-20 MED ORDER — MIDAZOLAM HCL 2 MG/2ML IJ SOLN: 1.0000 mg | INTRAMUSCULAR | Status: DC | PRN

## 2015-05-20 MED ORDER — SODIUM CHLORIDE 0.9 % IV SOLN
INTRAVENOUS | Status: DC
  Administered 2015-05-20 – 2015-05-22 (×3): via INTRAVENOUS
  Administered 2015-05-23: 75 mL/h via INTRAVENOUS
  Administered 2015-05-24: 03:00:00 via INTRAVENOUS

## 2015-05-20 MED ORDER — SENNOSIDES-DOCUSATE SODIUM 8.6-50 MG PO TABS
1.0000 | ORAL_TABLET | Freq: Two times a day (BID) | ORAL | Status: DC
  Administered 2015-05-20 – 2015-05-22 (×4): 1 via ORAL
  Filled 2015-05-20 (×4): qty 1

## 2015-05-20 MED ORDER — VANCOMYCIN HCL IN DEXTROSE 1-5 GM/200ML-% IV SOLN
1000.0000 mg | INTRAVENOUS | Status: DC
  Administered 2015-05-20 – 2015-05-22 (×3): 1000 mg via INTRAVENOUS
  Filled 2015-05-20 (×5): qty 200

## 2015-05-20 MED ORDER — FENTANYL CITRATE (PF) 100 MCG/2ML IJ SOLN
INTRAMUSCULAR | Status: AC
  Filled 2015-05-20: qty 2

## 2015-05-20 MED ORDER — CHLORHEXIDINE GLUCONATE 0.12% ORAL RINSE (MEDLINE KIT)
15.0000 mL | Freq: Two times a day (BID) | OROMUCOSAL | Status: DC
  Administered 2015-05-20 – 2015-06-09 (×39): 15 mL via OROMUCOSAL
  Filled 2015-05-20 (×35): qty 15

## 2015-05-20 MED ORDER — IPRATROPIUM-ALBUTEROL 0.5-2.5 (3) MG/3ML IN SOLN
3.0000 mL | Freq: Four times a day (QID) | RESPIRATORY_TRACT | Status: DC
  Administered 2015-05-20 – 2015-06-18 (×114): 3 mL via RESPIRATORY_TRACT
  Filled 2015-05-20 (×115): qty 3

## 2015-05-20 MED ORDER — MIDAZOLAM HCL 2 MG/2ML IJ SOLN
INTRAMUSCULAR | Status: AC
  Administered 2015-05-20: 2 mg
  Filled 2015-05-20: qty 2

## 2015-05-20 NOTE — Procedures (Signed)
Date: 05/20/2015, '@TIME'$     PHYSICIAN:  Rollande Thursby  Indications/Preliminary Diagnosis: RUL lung mass  Consent: (Place X beside choice/s below)  The benefits, risks and possible complications of the procedure were        explained to:  ___ patient  _x__ patient's family  ___ other:___________  who verbalized understanding and gave:  ___ verbal  ___ written  __x_ verbal and written  ___ telephone  ___ other:________ consent.      Unable to obtain consent; procedure performed on emergent basis.     Other:       PRESEDATION ASSESSMENT: History and Physical has been performed. Patient meds and allergies have been reviewed. Presedation airway examination has been performed and documented. Baseline vital signs, sedation score, oxygenation status, and cardiac rhythm were reviewed. Patient was deemed to be in satisfactory condition to undergo the procedure.  PREMEDICATIONS:   Sedative/Narcotic Amt Dose   Versed 2 mg   Fentanyl  mcg  Diprivan  mg        Airway Prep (Place X beside choice below)   1% Transtracheal Lidocaine Anesthetization 7 cc   Patient prepped per Bronchoscopy Lab Policy       Insertion Route (Place X beside choice below)   Nasal   Oral   Endotracheal Tube   Tracheostomy   INTRAPROCEDURE MEDICATIONS:  Sedative/Narcotic Amt Dose   Versed  mg   Fentanyl  mcg  Diprivan  mg       Medication Amt Dose  Medication Amt Dose  Xylocaine 2%  cc  Epinephrine 1:10,000 sol  cc  Xylocaine 4%  cc  Cocaine  cc   TECHNICAL PROCEDURES: (Place X beside choice below)   Procedures  Description    None     Electrocautery     Cryotherapy     Balloon Dilatation     Bronchography     Stent Placement   x  Therapeutic Aspiration     Laser/Argon Plasma    Brachytherapy Catheter Placement    Foreign Body Removal     SPECIMENS (Sites): (Place X beside choice below)  Specimens Description   No Specimens Obtained     Washings   x Lavage   x Biopsies   x Fine Needle  Aspirates    Brushings    Sputum    FINDINGS: see below  ESTIMATED BLOOD LOSS: <78IO  COMPLICATIONS/RESOLUTION:   PROCEDURE DETAILS: Timeout performed and correct patient, name, & ID confirmed. Following prep per Pulmonary policy, appropriate sedation was administered.  Airway exam proceeded with findings, technical procedures, and specimen collection as noted below. At the end of exam the scope was withdrawn without incident. Impression and Plan as noted below.   Inspection: Right pulmonary tree with significant copious, thick, purulent-looking sputum. Multiple plugs aspirated from right upper lobe and right mainstem. 90% compression of the orifice to right upper lobe, mucosal erythema of the right mainstem and right bronchus intermedius and right upper lobe orifice.  Left pulmonary tree were no endobronchial lesions, no mucosal erythema, clear scant secretions noted  Procedure: BAL of right middle lobe (sent for microbiology and cytology) Forcep biopsy of right upper lobe orifice and carina x 4 FNA biopsy of right upper lobe orifice and carina x 5  After biopsy, 10 mL of normal saline was instilled in the right upper lobe orifice area, area was washed and suctioned, no active bleeding noted.  IMPLANTED DEVICE(S): None  IMPRESSION:POST-PROCEDURE DX: Upper lobe mass  RECOMMENDATION/PLAN: Follow up biopsy results,  BAL cultures, and cytology    ADDITIONAL COMMENTS: None   Procedure Time: Approximately 65 minutes   Vilinda Boehringer, MD [facility] Pager : [phone] (Please enter 7 digits)

## 2015-05-20 NOTE — Progress Notes (Signed)
[facility] [place] NAME: Emily Pratt    MR#:  [id number]  DATE OF BIRTH:  1945-10-07  SUBJECTIVE:  CHIEF COMPLAINT:  Patient is on BiPAP. Clinically feeling better, denies any chest tightness or shortness of breath while resting. Just had a CAT scan of the chest   REVIEW OF SYSTEMS:  Review of Systems limited as pt is on Bipap Constitutional: Negative for fever and chills.  HENT: Negative for sore throat and tinnitus.  Eyes: Negative for blurred vision and redness.  Respiratory: Negative for cough and shortness of breath.  Cardiovascular: Negative for chest pain, palpitations, orthopnea and PND.  Gastrointestinal: Negative for nausea, vomiting, abdominal pain and diarrhea.  Genitourinary: Decreased urine output Musculoskeletal: Negative for myalgias and joint pain.  Neurological: Negative for speech change, focal weakness and weakness.    DRUG ALLERGIES:   Allergies  Allergen Reactions  . Atorvastatin Other (See Comments)    Does not remember why she had Intolerance to Lipitor.  . Fentanyl Itching  . Rosuvastatin Anxiety    Chest tigtness and feeling as if she had heartburn.    VITALS:  Blood pressure 111/59, pulse 102, temperature 97.5 F (36.4 C), temperature source Oral, resp. rate 18, height '5\' 7"'$  (1.702 m), weight 108 kg (238 lb 1.6 oz), SpO2 92 %.  PHYSICAL EXAMINATION:  GENERAL:  69 y.o.-year-old patient lying in the bed with respiratory distress EYES: Pupils equal, round, reactive to light and accommodation. No scleral icterus.  HEENT: Head atraumatic, normocephalic. Oropharynx and nasopharynx clear.  NECK:  Supple, no jugular venous distention. No thyroid enlargement, no tenderness.  LUNGS: Patient is not in respiratory distress , no wheezing and rales .Diminished breath sounds on right side. On BiPAP CARDIOVASCULAR: S1, S2 normal. No murmurs, rubs, or gallops.  ABDOMEN: Soft, nontender, nondistended. Bowel  sounds present. No organomegaly or mass.  EXTREMITIES: No pedal edema, cyanosis, or clubbing.  NEUROLOGIC: Awake alert and oriented PSYCHIATRIC: The patient is awake and alert SKIN: Sacral decubitus ulcer   LABORATORY PANEL:   CBC  Recent Labs Lab 05/20/15 0500  WBC 4.7  HGB 12.9  HCT 40.0  PLT 91*   ------------------------------------------------------------------------------------------------------------------  Chemistries   Recent Labs Lab 05/05/2015 0036 05/20/15 0500  NA 139 139  K 4.7 4.1  CL 94* 99*  CO2 36* 30  GLUCOSE 211* 188*  BUN 27* 39*  CREATININE 1.82* 2.14*  CALCIUM 8.9 8.4*  AST 24  --   ALT 13*  --   ALKPHOS 79  --   BILITOT 0.8  --    ------------------------------------------------------------------------------------------------------------------  Cardiac Enzymes  Recent Labs Lab 05/12/2015 0036  TROPONINI <0.03   ------------------------------------------------------------------------------------------------------------------  RADIOLOGY:  Dg Chest Portable 1 View  04/28/2015  CLINICAL DATA:  69 year old female with increasing shortness of breath Coll the today. EXAM: PORTABLE CHEST 1 VIEW COMPARISON:  Chest x-ray 04/24/2015. FINDINGS: Enlarging right pleural effusion. Extensive opacities throughout the right mid to lower lung, which may simply reflect passive atelectasis, however, underlying airspace consolidation or underlying mass is not excluded. Persistent prominence of right paratracheal soft tissue highly concerning for lymphadenopathy. Left lung appears relatively clear, although the left base is poorly visualized medially (likely technique related). No evidence of pulmonary edema. Heart size is normal. Atherosclerotic calcifications in the thoracic aorta. IMPRESSION: 1. Overall, there has been significant progression compared to the prior chest x-ray from 04/24/2015, with enlarging now a large right pleural effusion and worsening  aeration throughout the right lung.  This is unusual in the setting of a treated pneumonia, and given the presence of paratracheal soft tissue thickening on the right which is suspicious for lymphadenopathy, underlying neoplasm is strongly suspected. Further evaluation with contrast enhanced chest CT in is strongly recommended in the near future to evaluate for underlying neoplasm. 2. Atherosclerosis. Electronically Signed   By: Vinnie Langton M.D.   On: 05/18/2015 01:27    EKG:   Orders placed or performed during the hospital encounter of 04/24/15  . EKG 12-Lead  . EKG 12-Lead  . EKG    ASSESSMENT AND PLAN:   This is a 69 year old Caucasian female admitted for acute on chronic respiratory failure with hypercapnia and worsening pleural effusion. Patient is high risk for intubation 1. Acute on chronic respiratory failure with hypoxia and Hypercapnia, large right-sided pleural effusion probably from pneumonia/ CHF cannot rule out underlying malignancy Continue BiPAP as tolerated,  planning to switch her to high-flow oxygen if clinically better CT chest is pending Large right-sided Pleural effusion can be secondary to parapneumonic effusion but cannot rule out underlying malignancy-patient will be benefited with thoracentesis will defer that to critical care, Appreciate critical care recommendations Continue nebulizer treatments and IV steroids  Continue IV antibiotic Zosyn and vancomycin We will monitor her blood gases as needed  2. Congestive heart failure: Unspecified type.  Holding her Lasix in view of hypotension and worsening of renal function Currently patient is on BiPAP which will aid with the diuresis  3. Acute on chronic kidney disease:  The patient is intravascularly dry with low urine output and worsening of creatinine Consults nephrology  Her BNP is not elevated this time indicating heart failure is not the cause of her current symptoms   4. Essential hypertension:  Currently patient is hypotensive Hold  metoprolol and Cozaar  5. Diabetes mellitus type 2: Hold home medications Provide sliding scale insulin  6. Gout:  Currently patient is nothing by mouth 7. DVT prophylaxis: Heparin 8. GI prophylaxis: None    All the records are reviewed and case discussed with Care Management/Social Workerr. Management plans discussed with the patient, husband, RN and Dr. Mortimer Fries pulmonology  CODE STATUS: Full code  TOTAL CRITICAL CARE TIME TAKING CARE OF THIS PATIENT: 35 minutes.   POSSIBLE D/C IN ?  DAYS, DEPENDING ON CLINICAL CONDITION.   Nicholes Mango M.D on 05/20/2015 at 12:10 PM  Between 7am to 6pm - Pager - [phone] After 6pm go to [email or web address] - password [email or web address] [facility]  [facility]  Office  [phone]  CC: Primary care physician; Keith Rake, MD

## 2015-05-20 NOTE — Progress Notes (Signed)
Patient ID: Emily Pratt, female   DOB: 16-Sep-1945, 69 y.o.   MRN: [id number]  Chief Complaint  Patient presents with  . Respiratory Distress    Referred By Dr. Stevenson Clinch Reason for Referral Right lung mass  HPI Location, Quality, Duration, Severity, Timing, Context, Modifying Factors, Associated Signs and Symptoms.  Emily Pratt is a 69 y.o. female.  Admitted yesterday through ER with increasing shortness of breath and respiratory failure.  BiPAP initiated but still continued to remain hypoxic and hypercapnic so she was intubated within the last few hours.  Suctioned of large amounts of yellow mucous.  She had several CXRays and CT scan showing large right lung mass(es) and mediastinal adenopathy.  History of smoking.  Also history of COPD and CHF.  Currently intubated and sedated.  No other history obtainable from the patient.     Past Medical History  Diagnosis Date  . Anxiety   . Arthritis   . COPD (chronic obstructive pulmonary disease) ([place])   . CHF (congestive heart failure) ([place])   . Hyperlipidemia   . Hypertension   . Diabetes mellitus without complication ([place])   . Chronic kidney disease   . Neuromuscular disorder [facility])     Past Surgical History  Procedure Laterality Date  . Joint replacement Bilateral 2006    both knees  . Ankle surgery      Family History  Problem Relation Age of Onset  . Diabetes Mother   . Cancer Father   . Diabetes Sister   . Stroke Sister     Social History Social History  Substance Use Topics  . Smoking status: Current Every Day Smoker    Types: Cigarettes  . Smokeless tobacco: None     Comment: 0.5 pack /day  . Alcohol Use: No    Allergies  Allergen Reactions  . Atorvastatin Other (See Comments)    Does not remember why she had Intolerance to Lipitor.  . Fentanyl Itching  . Rosuvastatin Anxiety    Chest tigtness and feeling as if she had heartburn.    Current Facility-Administered Medications  Medication Dose Route  Frequency Provider Last Rate Last Dose  . acetaminophen (TYLENOL) tablet 650 mg  650 mg Oral Q6H PRN Harrie Foreman, MD       Or  . acetaminophen (TYLENOL) suppository 650 mg  650 mg Rectal Q6H PRN Harrie Foreman, MD      . antiseptic oral rinse (CPC / CETYLPYRIDINIUM CHLORIDE 0.05%) solution 7 mL  7 mL Mouth Rinse q12n4p Nicholes Mango, MD   7 mL at 05/05/2015 1720  . antiseptic oral rinse solution (CORINZ)  7 mL Mouth Rinse QID Vishal Mungal, MD      . azithromycin (ZITHROMAX) 500 mg in dextrose 5 % 250 mL IVPB  500 mg Intravenous Q24H Charlett Nose, RPH      . budesonide (PULMICORT) nebulizer solution 0.5 mg  0.5 mg Nebulization BID Flora Lipps, MD   0.5 mg at 05/20/15 0754  . chlorhexidine (PERIDEX) 0.12 % solution 15 mL  15 mL Mouth Rinse BID Nicholes Mango, MD   15 mL at 05/26/2015 2125  . chlorhexidine gluconate (PERIDEX) 0.12 % solution 15 mL  15 mL Mouth Rinse BID Vishal Mungal, MD      . colchicine tablet 0.6 mg  0.6 mg Oral Daily Harrie Foreman, MD   0.6 mg at 05/23/2015 1007  . heparin injection 5,000 Units  5,000 Units Subcutaneous 3 times per day Harrie Foreman, MD  5,000 Units at 05/20/15 0520  . insulin aspart (novoLOG) injection 0-5 Units  0-5 Units Subcutaneous QHS Nicholes Mango, MD   0 Units at 05/26/2015 2249  . insulin aspart (novoLOG) injection 0-9 Units  0-9 Units Subcutaneous TID WC Aruna Gouru, MD      . ipratropium-albuterol (DUONEB) 0.5-2.5 (3) MG/3ML nebulizer solution 3 mL  3 mL Nebulization Q4H PRN Vishal Mungal, MD      . ipratropium-albuterol (DUONEB) 0.5-2.5 (3) MG/3ML nebulizer solution 3 mL  3 mL Nebulization Q6H Vishal Mungal, MD   3 mL at 05/20/15 0950  . linagliptin (TRADJENTA) tablet 5 mg  5 mg Oral Daily Harrie Foreman, MD   5 mg at 05/25/2015 1006  . losartan (COZAAR) tablet 100 mg  100 mg Oral Daily Harrie Foreman, MD   100 mg at 05/09/2015 1007  . methylPREDNISolone sodium succinate (SOLU-MEDROL) 125 mg/2 mL injection 60 mg  60 mg Intravenous 3 times  per day Nicholes Mango, MD   60 mg at 05/20/15 0520  . metoprolol (LOPRESSOR) tablet 100 mg  100 mg Oral BID Harrie Foreman, MD   100 mg at 05/06/2015 1011  . midazolam (VERSED) injection 1 mg  1 mg Intravenous Q15 min PRN Vishal Mungal, MD      . midazolam (VERSED) injection 1 mg  1 mg Intravenous Q2H PRN Vishal Mungal, MD      . morphine 2 MG/ML injection 2 mg  2 mg Intravenous Q4H PRN Harrie Foreman, MD   2 mg at 05/20/15 0325  . ondansetron (ZOFRAN) tablet 4 mg  4 mg Oral Q6H PRN Harrie Foreman, MD       Or  . ondansetron [facility]) injection 4 mg  4 mg Intravenous Q6H PRN Harrie Foreman, MD      . piperacillin-tazobactam (ZOSYN) IVPB 3.375 g  3.375 g Intravenous 3 times per day Flora Lipps, MD   3.375 g at 05/20/15 0520  . pravastatin (PRAVACHOL) tablet 10 mg  10 mg Oral Daily Harrie Foreman, MD   10 mg at 05/21/2015 1006  . pregabalin (LYRICA) capsule 50 mg  50 mg Oral TID Harrie Foreman, MD   50 mg at 05/11/2015 2125  . propofol (DIPRIVAN) 1000 MG/100ML infusion           . propofol (DIPRIVAN) 1000 MG/100ML infusion  0-50 mcg/kg/min Intravenous Continuous Vishal Mungal, MD      . tiotropium (SPIRIVA) inhalation capsule 18 mcg  18 mcg Inhalation Daily Harrie Foreman, MD      . vancomycin (VANCOCIN) IVPB 1000 mg/200 mL premix  1,000 mg Intravenous Q24H Nicholes Mango, MD          Review of Systems A complete review of systems was asked and was negative except for the following positive findings as per the history and physical on file  Blood pressure 111/57, pulse 114, temperature 97.1 F (36.2 C), temperature source Oral, resp. rate 16, height '5\' 7"'$  (1.702 m), weight 238 lb 1.6 oz (108 kg), SpO2 91 %.  Physical Exam CONSTITUTIONAL:  Intubated EARS, NOSE, MOUTH AND THROAT:  The oropharynx was clear.  Dentition is good repair.  Oral mucosa pink and moist. LYMPH NODES:  Lymph nodes in the neck and axillae were normal RESPIRATORY:  Minimal breath sounds right apical area.  Left  breath sounds are normal CARDIOVASCULAR: Heart was regular without murmurs.  There were no carotid bruits. GI: The abdomen was soft, nontender, but somewhat distended. There were no  palpable masses. There was no hepatosplenomegaly. There were normal bowel sounds in all quadrants. GU:  Rectal deferred.     SKIN:  There were no pathologic skin lesions.  There were no nodules on palpation.    Data Reviewed  I have personally reviewed the patient's imaging, laboratory findings and medical records.    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  Unresectable.       Plan    Would send sputum for cytology as well as culture. Bronch for more detailed assessment of anatomy Consult Radiation Therapy and Oncology once diagnosis is established.        Nestor Lewandowsky, MD 05/20/2015, 1:54 PM

## 2015-05-20 NOTE — Procedures (Addendum)
Procedure Note:  Orotracheal Intubation  Implied consent due to emergent nature of patient's condition. Correct Patient, Name & ID confirmed.  The patient was pre-oxygenated and then, under direct visualization, a 7.5 mm cuffed endotracheal tube was placed through the vocal cords into the trachea, using the Glidescope.  Total attempts made 1, using Glidescope, small anterior airway, very thick secretions noted during intubation During intubation an assistant applied gentle pressure to the cricoid cartilage.  Position confirmed by auscultation of lungs (good breath sounds bilaterally) and no stomach sounds.  Tube secured at 24 cm. Pulse ox 92 %. CO2 detector in place with appropriate color change.   Pt tolerated procedure well.  No complications were noted.   CXR ordered.   Meds used during intubation: Versed - '4mg'$  Fentanyl - 14mg Vecuronium - '10mg'$    VVilinda Boehringer MD  Pulmonary and Critical Care Pager [phone] Call Pager [phone]

## 2015-05-20 NOTE — Consult Note (Addendum)
[facility] Consultation      Name: Emily Pratt MRN: [id number] DOB: July 26, 1946    ADMISSION DATE:  04/29/2015  CHIEF COMPLAINT:  Lethargic and resp distress   HISTORY OF PRESENT ILLNESS 05/02/2015  69 yo obese white female admitted to ICU for acute resp failure  The patient presents via EMS complaining of shortness of breath. She reportedly had oxygen saturations as low as 70% when EMS arrived. The patient was on BiPAP at that time. FiO2 was increased to 100% and oxygen saturations improved to only 79%. In the emergency department patient was immediately initiated on BiPAP. She had aggressively become more somnolent.  Chest x-ray showed worsening pleural effusion. Patient on biPAP fio2 at 50% now      SIGNIFICANT EVENTS  No significant overnight events, on FiO2 55% Bipap, easily awaken but somnolent. States that breathing is about the same   ICU admission 10/24   PAST MEDICAL HISTORY    :  Past Medical History  Diagnosis Date  . Anxiety   . Arthritis   . COPD (chronic obstructive pulmonary disease) ([place])   . CHF (congestive heart failure) ([place])   . Hyperlipidemia   . Hypertension   . Diabetes mellitus without complication ([place])   . Chronic kidney disease   . Neuromuscular disorder [facility])    Past Surgical History  Procedure Laterality Date  . Joint replacement Bilateral 2006    both knees  . Ankle surgery     Prior to Admission medications   Medication Sig Start Date End Date Taking? Authorizing Provider  albuterol (PROVENTIL) (2.5 MG/3ML) 0.083% nebulizer solution Take 3 mLs (2.5 mg total) by nebulization every 6 (six) hours as needed for wheezing or shortness of breath. 05/07/15  Yes Roselee Nova, MD  allopurinol (ZYLOPRIM) 300 MG tablet Take 1 tablet (300 mg total) by mouth daily. 03/06/15  Yes Roselee Nova, MD  budesonide-formoterol [facility]) 160-4.5 MCG/ACT inhaler Inhale 2 puffs into the lungs 2 (two) times daily. 04/24/15  Yes  Roselee Nova, MD  colchicine 0.6 MG tablet Take 1 tablet (0.6 mg total) by mouth daily. 03/06/15  Yes Roselee Nova, MD  furosemide (LASIX) 80 MG tablet Take 80 mg by mouth daily.  11/06/14  Yes Historical Provider, MD  JANUVIA 50 MG tablet Take 1 tablet (50 mg total) by mouth daily. 03/06/15  Yes Roselee Nova, MD  losartan (COZAAR) 100 MG tablet Take 1 tablet (100 mg total) by mouth daily. 03/06/15  Yes Roselee Nova, MD  LYRICA 50 MG capsule Take 1 capsule (50 mg total) by mouth 3 (three) times daily. 03/06/15  Yes Roselee Nova, MD  metoprolol (LOPRESSOR) 100 MG tablet Take 1 tablet (100 mg total) by mouth 2 (two) times daily. 03/06/15  Yes Roselee Nova, MD  oxyCODONE-acetaminophen (PERCOCET) 10-325 MG tablet Take 1 tablet by mouth every 6 (six) hours as needed for pain. 05/07/15  Yes Roselee Nova, MD  pravastatin (PRAVACHOL) 10 MG tablet Take 10 mg by mouth daily.   Yes Historical Provider, MD  tiotropium (SPIRIVA HANDIHALER) 18 MCG inhalation capsule Place 1 capsule (18 mcg total) into inhaler and inhale daily. 05/09/15  Yes Roselee Nova, MD  VENTOLIN HFA 108 (90 BASE) MCG/ACT inhaler Inhale 1 puff into the lungs every 4 (four) hours as needed.  03/25/15  Yes Historical Provider, MD  predniSONE (DELTASONE) 10 MG tablet Take 1 tablet (10  mg total) by mouth daily with breakfast. Patient not taking: Reported on 05/09/2015 04/25/15   Bettey Costa, MD   Allergies  Allergen Reactions  . Atorvastatin Other (See Comments)    Does not remember why she had Intolerance to Lipitor.  . Fentanyl Itching  . Rosuvastatin Anxiety    Chest tigtness and feeling as if she had heartburn.     FAMILY HISTORY   Family History  Problem Relation Age of Onset  . Diabetes Mother   . Cancer Father   . Diabetes Sister   . Stroke Sister       SOCIAL HISTORY    reports that she has been smoking Cigarettes.  She does not have any smokeless tobacco history on file. She reports that she  does not drink alcohol or use illicit drugs.  Review of Systems  Constitutional: Negative for fever and chills.  HENT: Negative for hearing loss.   Eyes: Negative for double vision.  Respiratory: Positive for sputum production, shortness of breath and wheezing.   Cardiovascular: Positive for palpitations.  Gastrointestinal: Negative for heartburn and nausea.  Musculoskeletal: Negative for myalgias.  Skin: Negative for itching and rash.  Neurological: Negative for headaches.      VITAL SIGNS    Temp:  [97.5 F (36.4 C)-98.9 F (37.2 C)] 97.5 F (36.4 C) (10/25 0700) Pulse Rate:  [74-109] 97 (10/25 0800) Resp:  [14-24] 15 (10/25 0800) BP: (81-124)/(49-98) 111/58 mmHg (10/25 0800) SpO2:  [88 %-94 %] 90 % (10/25 0800) FiO2 (%):  [50 %-55 %] 55 % (10/25 0755) HEMODYNAMICS:   VENTILATOR SETTINGS: Vent Mode:  [-]  FiO2 (%):  [50 %-55 %] 55 % INTAKE / OUTPUT:  Intake/Output Summary (Last 24 hours) at 05/20/15 0913 Last data filed at 05/20/15 0520  Gross per 24 hour  Intake    550 ml  Output    400 ml  Net    150 ml       PHYSICAL EXAM   Physical Exam  Constitutional: She appears well-developed and well-nourished.  HENT:  Head: Normocephalic and atraumatic.  Mouth/Throat: No oropharyngeal exudate.  Eyes: EOM are normal. Pupils are equal, round, and reactive to light. No scleral icterus.  Neck: Normal range of motion. Neck supple.  Cardiovascular: Normal rate, regular rhythm and normal heart sounds.   No murmur heard. Pulmonary/Chest: No stridor. She is in respiratory distress. She has wheezes. She has rales.  Abdominal: Soft. Bowel sounds are normal. She exhibits no distension. There is no tenderness. There is no rebound.  Musculoskeletal: Normal range of motion. She exhibits no edema.  Neurological: She displays normal reflexes. Coordination normal.  Lethargic,responds to vocal stimuli   Skin: Skin is warm. She is diaphoretic.  Psychiatric: She has a normal mood  and affect.  Nursing note and vitals reviewed.      LABS    Recent Labs Lab 05/06/2015 0036 05/20/15 0500  WBC 6.7 4.7  HGB 15.5 12.9  HCT 47.6* 40.0  PLT 108* 91*   Coag's No results for input(s): APTT, INR in the last 168 hours. BMET  Recent Labs Lab 05/06/2015 0036 05/20/15 0500  NA 139 139  K 4.7 4.1  CL 94* 99*  CO2 36* 30  BUN 27* 39*  CREATININE 1.82* 2.14*  GLUCOSE 211* 188*   Electrolytes  Recent Labs Lab 05/02/2015 0036 05/20/15 0500  CALCIUM 8.9 8.4*   Sepsis Markers  Recent Labs Lab 05/08/2015 0113 05/01/2015 0410  LATICACIDVEN 2.3* 1.0   ABG  Recent  Labs Lab 05/25/2015 1215  PHART 7.29*  PCO2ART 75*  PO2ART 60*   Liver Enzymes  Recent Labs Lab 04/27/2015 0036  AST 24  ALT 13*  ALKPHOS 79  BILITOT 0.8  ALBUMIN 3.4*   Cardiac Enzymes  Recent Labs Lab 05/10/2015 0036  TROPONINI <0.03   Glucose  Recent Labs Lab 05/21/2015 1214 05/15/2015 1655 05/11/2015 2121 05/20/15 0720  GLUCAP 160* 199* 156* 201*     Recent Results (from the past 240 hour(s))  MRSA PCR Screening     Status: None   Collection Time: 05/20/2015 12:28 AM  Result Value Ref Range Status   MRSA by PCR NEGATIVE NEGATIVE Final    Comment:        The GeneXpert MRSA Assay (FDA approved for NASAL specimens only), is one component of a comprehensive MRSA colonization surveillance program. It is not intended to diagnose MRSA infection nor to guide or monitor treatment for MRSA infections.   Culture, blood (routine x 2)     Status: None (Preliminary result)   Collection Time: 04/26/2015  1:13 AM  Result Value Ref Range Status   Specimen Description BLOOD RIGHT HAND  Final   Special Requests BOTTLES DRAWN AEROBIC AND ANAEROBIC 3CC  Final   Culture NO GROWTH 1 DAY  Final   Report Status PENDING  Incomplete  Culture, blood (routine x 2)     Status: None (Preliminary result)   Collection Time: 04/30/2015  1:13 AM  Result Value Ref Range Status   Specimen Description  BLOOD RIGHT ASSIST CONTROL  Final   Special Requests BOTTLES DRAWN AEROBIC AND ANAEROBIC 4CC  Final   Culture NO GROWTH 1 DAY  Final   Report Status PENDING  Incomplete     Current facility-administered medications:  .  acetaminophen (TYLENOL) tablet 650 mg, 650 mg, Oral, Q6H PRN **OR** acetaminophen (TYLENOL) suppository 650 mg, 650 mg, Rectal, Q6H PRN, Harrie Foreman, MD .  albuterol (PROVENTIL) (2.5 MG/3ML) 0.083% nebulizer solution 2.5 mg, 2.5 mg, Nebulization, Q4H, Flora Lipps, MD, 2.5 mg at 05/20/15 0754 .  allopurinol (ZYLOPRIM) tablet 300 mg, 300 mg, Oral, Daily, Harrie Foreman, MD, 300 mg at 05/01/2015 1008 .  antiseptic oral rinse (CPC / CETYLPYRIDINIUM CHLORIDE 0.05%) solution 7 mL, 7 mL, Mouth Rinse, q12n4p, Aruna Gouru, MD, 7 mL at 05/18/2015 1720 .  azithromycin (ZITHROMAX) 500 mg in dextrose 5 % 250 mL IVPB, 500 mg, Intravenous, Q24H, Flora Lipps, MD, 500 mg at 05/22/2015 1451 .  budesonide (PULMICORT) nebulizer solution 0.5 mg, 0.5 mg, Nebulization, BID, Flora Lipps, MD, 0.5 mg at 05/20/15 0754 .  budesonide-formoterol (SYMBICORT) 160-4.5 MCG/ACT inhaler 2 puff, 2 puff, Inhalation, BID, Harrie Foreman, MD, 2 puff at 05/20/2015 2248 .  chlorhexidine (PERIDEX) 0.12 % solution 15 mL, 15 mL, Mouth Rinse, BID, Nicholes Mango, MD, 15 mL at 04/27/2015 2125 .  colchicine tablet 0.6 mg, 0.6 mg, Oral, Daily, Harrie Foreman, MD, 0.6 mg at 05/24/2015 1007 .  furosemide (LASIX) injection 40 mg, 40 mg, Intravenous, BID, Nicholes Mango, MD, 40 mg at 05/20/15 0753 .  heparin injection 5,000 Units, 5,000 Units, Subcutaneous, 3 times per day, Harrie Foreman, MD, 5,000 Units at 05/20/15 0520 .  insulin aspart (novoLOG) injection 0-12 Units, 0-12 Units, Subcutaneous, TID AC & HS, Flora Lipps, MD, 6 Units at 05/20/15 0753 .  insulin aspart (novoLOG) injection 0-5 Units, 0-5 Units, Subcutaneous, QHS, Nicholes Mango, MD, 0 Units at 05/18/2015 2249 .  insulin aspart (novoLOG) injection 0-9 Units, 0-9  Units,  Subcutaneous, TID WC, Aruna Gouru, MD .  linagliptin (TRADJENTA) tablet 5 mg, 5 mg, Oral, Daily, Harrie Foreman, MD, 5 mg at 05/22/2015 1006 .  losartan (COZAAR) tablet 100 mg, 100 mg, Oral, Daily, Harrie Foreman, MD, 100 mg at 05/02/2015 1007 .  methylPREDNISolone sodium succinate (SOLU-MEDROL) 125 mg/2 mL injection 60 mg, 60 mg, Intravenous, 3 times per day, Nicholes Mango, MD, 60 mg at 05/20/15 0520 .  metoprolol (LOPRESSOR) tablet 100 mg, 100 mg, Oral, BID, Harrie Foreman, MD, 100 mg at 05/12/2015 1011 .  morphine 2 MG/ML injection 2 mg, 2 mg, Intravenous, Q4H PRN, Harrie Foreman, MD, 2 mg at 05/20/15 0325 .  ondansetron (ZOFRAN) tablet 4 mg, 4 mg, Oral, Q6H PRN **OR** ondansetron (ZOFRAN) injection 4 mg, 4 mg, Intravenous, Q6H PRN, Harrie Foreman, MD .  piperacillin-tazobactam (ZOSYN) IVPB 3.375 g, 3.375 g, Intravenous, 3 times per day, Flora Lipps, MD, 3.375 g at 05/20/15 0520 .  pravastatin (PRAVACHOL) tablet 10 mg, 10 mg, Oral, Daily, Harrie Foreman, MD, 10 mg at 05/15/2015 1006 .  pregabalin (LYRICA) capsule 50 mg, 50 mg, Oral, TID, Harrie Foreman, MD, 50 mg at 04/26/2015 2125 .  tiotropium (SPIRIVA) inhalation capsule 18 mcg, 18 mcg, Inhalation, Daily, Harrie Foreman, MD .  vancomycin (VANCOCIN) 1,250 mg in sodium chloride 0.9 % 250 mL IVPB, 1,250 mg, Intravenous, Q24H, Flora Lipps, MD, 1,250 mg at 05/09/2015 2113  IMAGING    No results found.   MAJOR EVENTS/TEST RESULTS: CXR as reported above   INDWELLING DEVICES:: R Fem CVL 10/25>>  MICRO DATA: MRSA PCR >>neg BAL 10/25>>  ANTIMICROBIALS:  Zosyn/vancomycin 10/24    ASSESSMENT/PLAN   69 yo obese white female admitted to ICU for acute hypercapnic and hypoxic resp failure acute COPD and CHF exacerbation from probable pneumonia  PULMONARY-high risk for intubation -Respiratory Failure -continue biPAP as tolerated no significant change over the past 24 hrs, high risk for intubation.  -continue Bronchodilator  Therapy -COPD  And CHF exacerbation -BD therapy, IV steroids -repeat ABG pending  CARDIOVASCULAR Needs ICU monitoring -not on vasopressors  RENAL Foley cath, watch UO -stop lasix due to inc creatinine  GASTROINTESTINAL Keep NPO for now  HEMATOLOGIC Follow CBC  INFECTIOUS Empiric abx for pneumonia -start vanc/sozyn  ENDOCRINE - ICU hypoglycemic\Hyperglycemia protocol   NEUROLOGIC Lethargic,somnelent -high risk for intubation   I have personally obtained a history, examined the patient, evaluated laboratory and independently reviewed  imaging results, formulated the assessment and plan and placed orders.  The Patient requires high complexity decision making for assessment and support, frequent evaluation and titration of therapies, application of advanced monitoring technologies and extensive interpretation of multiple databases. Critical Care Time devoted to patient care services described in this note is 45 minutes.   Overall, patient is critically ill, prognosis is guarded. Patient at high risk for cardiac arrest and death.    Vilinda Boehringer, MD [facility] Pager [phone] (please enter 7-digits) On Call Pager - [phone] (please enter 7-digits)

## 2015-05-20 NOTE — Progress Notes (Addendum)
Patient with worsening respiratory status, unable to weaned from BiPAP. 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  Patient is still altered, I have spoken to the husband and he has agreed with intubation.    Vilinda Boehringer, MD [facility] Pager [phone] (please enter 7-digits) On Call Pager - [phone] (please enter 7-digits)

## 2015-05-20 NOTE — Procedures (Signed)
  Procedure Note: Central Venous Catheter Placement Emily Pratt , [id number] , [id number]/[id number]-[id number]  Indications: Hemodynamic monitoring / Intravenous access  Benefits, risks (including bleeding, infection,  Injury, etc.), and alternatives explained to husband who voiced understanding.  Questions were sought and answered.  Husband agreed to proceed with the procedure.  Consent is signed and on chart. A time-out was completed verifying correct patient, procedure and site. A 3 lumen catheter available at the time of procedure.  The patient was placed in a dependent position appropriate for central line placement based on the vein to be cannulated.  The patient's RIGHT Femoral Vein was prepped and draped in a sterile fashion. 1% Lidocaine WAS used to anesthetize the surrounding skin area.  A 3 lumen catheter was introduced into the RIGHT Femoral Vein using Seldinger technique, visualized under ultrasound.  The catheter was threaded smoothly over the guide wire and appropriate blood return was obtained.  Each lumen of the catheter was evacuated of air and flushed with sterile saline.  The catheter was then sutured in place to the skin and a sterile dressing applied.  Perfusion to the extremity distal to the point of catheter insertion was checked and found to be adequate.    The patient tolerated the procedure well and there were no complications.  Vilinda Boehringer, MD [facility] Pager [phone] (please enter 7-digits) On Call Pager - [phone] (please enter 7-digits)

## 2015-05-20 NOTE — Progress Notes (Signed)
ANTIBIOTIC CONSULT NOTE - INITIAL  Pharmacy Consult for Vancomycin/Zosyn Indication: pneumonia  Allergies  Allergen Reactions  . Atorvastatin Other (See Comments)    Does not remember why she had Intolerance to Lipitor.  . Fentanyl Itching  . Rosuvastatin Anxiety    Chest tigtness and feeling as if she had heartburn.    Patient Measurements: Height: '5\' 7"'$  (170.2 cm) Weight: 238 lb 1.6 oz (108 kg) IBW/kg (Calculated) : 61.6 Adjusted Body Weight: 80.2 kg  Vital Signs: Temp: 97.5 F (36.4 C) (10/25 0700) Temp Source: Oral (10/25 0500) BP: 111/59 mmHg (10/25 1000) Pulse Rate: 102 (10/25 1000) Intake/Output from previous day: 10/24 0701 - 10/25 0700 In: 550 [IV Piggyback:550] Out: 400 [Urine:400] Intake/Output from this shift:    Labs:  Recent Labs  05/10/2015 0036 05/20/15 0500  WBC 6.7 4.7  HGB 15.5 12.9  PLT 108* 91*  CREATININE 1.82* 2.14*   Estimated Creatinine Clearance: 32.3 mL/min (by C-G formula based on Cr of 2.14). No results for input(s): VANCOTROUGH, VANCOPEAK, VANCORANDOM, GENTTROUGH, GENTPEAK, GENTRANDOM, TOBRATROUGH, TOBRAPEAK, TOBRARND, AMIKACINPEAK, AMIKACINTROU, AMIKACIN in the last 72 hours.   Microbiology: Recent Results (from the past 720 hour(s))  Blood culture (routine x 2)     Status: None   Collection Time: 04/24/15  6:31 PM  Result Value Ref Range Status   Specimen Description BLOOD LEFT ASSIST CONTROL  Final   Special Requests BOTTLES DRAWN AEROBIC AND ANAEROBIC  4CC  Final   Culture NO GROWTH 6 DAYS  Final   Report Status 04/30/2015 FINAL  Final  Blood culture (routine x 2)     Status: None   Collection Time: 04/24/15  6:31 PM  Result Value Ref Range Status   Specimen Description BLOOD LEFT HAND  Final   Special Requests BOTTLES DRAWN AEROBIC AND ANAEROBIC  2CC  Final   Culture NO GROWTH 6 DAYS  Final   Report Status 04/30/2015 FINAL  Final  MRSA PCR Screening     Status: None   Collection Time: 05/11/2015 12:28 AM  Result Value Ref  Range Status   MRSA by PCR NEGATIVE NEGATIVE Final    Comment:        The GeneXpert MRSA Assay (FDA approved for NASAL specimens only), is one component of a comprehensive MRSA colonization surveillance program. It is not intended to diagnose MRSA infection nor to guide or monitor treatment for MRSA infections.   Culture, blood (routine x 2)     Status: None (Preliminary result)   Collection Time: 04/30/2015  1:13 AM  Result Value Ref Range Status   Specimen Description BLOOD RIGHT HAND  Final   Special Requests BOTTLES DRAWN AEROBIC AND ANAEROBIC 3CC  Final   Culture NO GROWTH 1 DAY  Final   Report Status PENDING  Incomplete  Culture, blood (routine x 2)     Status: None (Preliminary result)   Collection Time: 05/26/2015  1:13 AM  Result Value Ref Range Status   Specimen Description BLOOD RIGHT ASSIST CONTROL  Final   Special Requests BOTTLES DRAWN AEROBIC AND ANAEROBIC 4CC  Final   Culture NO GROWTH 1 DAY  Final   Report Status PENDING  Incomplete    Medical History: Past Medical History  Diagnosis Date  . Anxiety   . Arthritis   . COPD (chronic obstructive pulmonary disease) ([place])   . CHF (congestive heart failure) ([place])   . Hyperlipidemia   . Hypertension   . Diabetes mellitus without complication ([place])   . Chronic kidney disease   .  Neuromuscular disorder ([facility])     Medications:  Scheduled:  . antiseptic oral rinse  7 mL Mouth Rinse q12n4p  . azithromycin  500 mg Intravenous Q24H  . budesonide (PULMICORT) nebulizer solution  0.5 mg Nebulization BID  . chlorhexidine  15 mL Mouth Rinse BID  . colchicine  0.6 mg Oral Daily  . heparin  5,000 Units Subcutaneous 3 times per day  . insulin aspart  0-5 Units Subcutaneous QHS  . insulin aspart  0-9 Units Subcutaneous TID WC  . ipratropium-albuterol  3 mL Nebulization Q6H  . linagliptin  5 mg Oral Daily  . losartan  100 mg Oral Daily  . methylPREDNISolone (SOLU-MEDROL) injection  60 mg Intravenous 3 times per day  .  metoprolol  100 mg Oral BID  . piperacillin-tazobactam (ZOSYN)  IV  3.375 g Intravenous 3 times per day  . pravastatin  10 mg Oral Daily  . pregabalin  50 mg Oral TID  . tiotropium  18 mcg Inhalation Daily  . vancomycin  1,000 mg Intravenous Q24H   Infusions:   Assessment: 69 y/o F with acute respiratory failure due to COPD and CHF exacerbation and possible PNA.    ke: 0.031 Vd: 56.1   Goal of Therapy:  Vancomycin trough level 15-20 mcg/ml  Plan:  As SCr has worsened, will change vancomycin dosing to 1000 mg iv q 24 hours. Will also move the trough one day further so that it will remain with the 4th dose representing steady state. Will continue Zosyn 3.375 g EI q 8 hours. Will f/u renal function and culture results.   Ulice Dash D 05/20/2015,12:49 PM

## 2015-05-20 NOTE — Consult Note (Signed)
[facility] CONSULT NOTE    Date: 05/20/2015                  Patient Name:  Emily Pratt  MRN: [id number]  DOB: [birth year]  Age / Sex: 69 y.o., female         PCP: Dagoberto Ligas, MD                 Service Requesting Consult: Dr. Margaretmary Eddy                 Reason for Consult: Acute renal failure.            History of Present Illness: Patient is a 69 y.o. female with a PMHx of anxiety, osteoarthritis, COPD, congestive heart failure, hyperlipidemia, hypertension, diabetes mellitus, chronic and disease stage III Baseline creatinine 1.1, who was admitted to [facility] on 04/29/2015 for evaluation of shortness of breath. Patient unable to offer any history at this point in time and she is currently intubated and sedated. She has just undergone a bronchoscopy for evaluating a right central lung mass. She has acute respiratory failure and is currently on the ventilator. We are asked to see the patient for evaluation management of acute renal failure in the setting of known chronic kidney disease stage III. Her baseline creatinine is 1.1. Currently creatinine is up to 2.1. She has been oliguric during the day. She is currently hypotensive and she received increasing sedation for the bronchoscopy. She was on losartan and Lasix at home.   Medications: Outpatient medications: Facility-administered medications prior to admission  Medication Dose Route Frequency Provider Last Rate Last Dose  . albuterol (PROVENTIL) (2.5 MG/3ML) 0.083% nebulizer solution 2.5 mg  2.5 mg Nebulization Once Ashok Norris, MD      . albuterol (PROVENTIL) (2.5 MG/3ML) 0.083% nebulizer solution 2.5 mg  2.5 mg Nebulization Once Roselee Nova, MD       Prescriptions prior to admission  Medication Sig Dispense Refill Last Dose  . albuterol (PROVENTIL) (2.5 MG/3ML) 0.083% nebulizer solution Take 3 mLs (2.5 mg total) by nebulization every 6 (six) hours as needed for wheezing or shortness of breath. 150 mL  1 prn at prn  . allopurinol (ZYLOPRIM) 300 MG tablet Take 1 tablet (300 mg total) by mouth daily. 90 tablet 0 unknown at unknown  . budesonide-formoterol (SYMBICORT) 160-4.5 MCG/ACT inhaler Inhale 2 puffs into the lungs 2 (two) times daily. 1 Inhaler 3 unknown at unknown  . colchicine 0.6 MG tablet Take 1 tablet (0.6 mg total) by mouth daily. 90 tablet 0 unknown at unknown  . furosemide (LASIX) 80 MG tablet Take 80 mg by mouth daily.    unknown at unknown  . JANUVIA 50 MG tablet Take 1 tablet (50 mg total) by mouth daily. 90 tablet 0 unknown at unknown  . losartan (COZAAR) 100 MG tablet Take 1 tablet (100 mg total) by mouth daily. 90 tablet 0 unknown at unknown  . LYRICA 50 MG capsule Take 1 capsule (50 mg total) by mouth 3 (three) times daily. 90 capsule 2 unknown at unknown  . metoprolol (LOPRESSOR) 100 MG tablet Take 1 tablet (100 mg total) by mouth 2 (two) times daily. 180 tablet 0 unknown at unknown  . oxyCODONE-acetaminophen (PERCOCET) 10-325 MG tablet Take 1 tablet by mouth every 6 (six) hours as needed for pain. 120 tablet 0 prn at prn  . pravastatin (PRAVACHOL) 10 MG tablet Take 10 mg by mouth daily.   unknown at unknown  .  tiotropium (SPIRIVA HANDIHALER) 18 MCG inhalation capsule Place 1 capsule (18 mcg total) into inhaler and inhale daily. 90 capsule 0 unknown at unknown  . VENTOLIN HFA 108 (90 BASE) MCG/ACT inhaler Inhale 1 puff into the lungs every 4 (four) hours as needed.    prn at prn  . predniSONE (DELTASONE) 10 MG tablet Take 1 tablet (10 mg total) by mouth daily with breakfast. (Patient not taking: Reported on 05/24/2015) 30 tablet 0 Completed Course at Unknown time    Current medications: Current Facility-Administered Medications  Medication Dose Route Frequency Provider Last Rate Last Dose  . acetaminophen (TYLENOL) tablet 650 mg  650 mg Oral Q6H PRN Harrie Foreman, MD       Or  . acetaminophen (TYLENOL) suppository 650 mg  650 mg Rectal Q6H PRN Harrie Foreman, MD       . antiseptic oral rinse (CPC / CETYLPYRIDINIUM CHLORIDE 0.05%) solution 7 mL  7 mL Mouth Rinse q12n4p Nicholes Mango, MD   7 mL at 05/12/2015 1720  . antiseptic oral rinse solution (CORINZ)  7 mL Mouth Rinse QID Vishal Mungal, MD      . azithromycin (ZITHROMAX) 500 mg in dextrose 5 % 250 mL IVPB  500 mg Intravenous Q24H Charlett Nose, RPH      . budesonide (PULMICORT) nebulizer solution 0.5 mg  0.5 mg Nebulization BID Flora Lipps, MD   0.5 mg at 05/20/15 0754  . chlorhexidine (PERIDEX) 0.12 % solution 15 mL  15 mL Mouth Rinse BID Nicholes Mango, MD   15 mL at 05/18/2015 2125  . chlorhexidine gluconate (PERIDEX) 0.12 % solution 15 mL  15 mL Mouth Rinse BID Vishal Mungal, MD   15 mL at 05/20/15 1508  . colchicine tablet 0.6 mg  0.6 mg Oral Daily Harrie Foreman, MD   0.6 mg at 05/25/2015 1007  . heparin injection 5,000 Units  5,000 Units Subcutaneous 3 times per day Harrie Foreman, MD   5,000 Units at 05/20/15 0520  . insulin aspart (novoLOG) injection 0-5 Units  0-5 Units Subcutaneous QHS Nicholes Mango, MD   0 Units at 05/12/2015 2249  . insulin aspart (novoLOG) injection 0-9 Units  0-9 Units Subcutaneous TID WC Nicholes Mango, MD   2 Units at 05/20/15 1511  . ipratropium-albuterol (DUONEB) 0.5-2.5 (3) MG/3ML nebulizer solution 3 mL  3 mL Nebulization Q4H PRN Vishal Mungal, MD      . ipratropium-albuterol (DUONEB) 0.5-2.5 (3) MG/3ML nebulizer solution 3 mL  3 mL Nebulization Q6H Vishal Mungal, MD   3 mL at 05/20/15 1510  . linagliptin (TRADJENTA) tablet 5 mg  5 mg Oral Daily Harrie Foreman, MD   5 mg at 05/08/2015 1006  . losartan (COZAAR) tablet 100 mg  100 mg Oral Daily Harrie Foreman, MD   100 mg at 05/21/2015 1007  . methylPREDNISolone sodium succinate (SOLU-MEDROL) 125 mg/2 mL injection 60 mg  60 mg Intravenous 3 times per day Nicholes Mango, MD   60 mg at 05/20/15 1509  . metoprolol (LOPRESSOR) tablet 100 mg  100 mg Oral BID Harrie Foreman, MD   100 mg at 05/12/2015 1011  . midazolam (VERSED) 2 MG/2ML  injection           . midazolam (VERSED) injection 1 mg  1 mg Intravenous Q15 min PRN Vishal Mungal, MD      . midazolam (VERSED) injection 1 mg  1 mg Intravenous Q2H PRN Vishal Mungal, MD      . midazolam (  VERSED) injection 2 mg  2 mg Intravenous Once Vishal Mungal, MD      . morphine 2 MG/ML injection 2 mg  2 mg Intravenous Q4H PRN Harrie Foreman, MD   2 mg at 05/20/15 0325  . ondansetron (ZOFRAN) tablet 4 mg  4 mg Oral Q6H PRN Harrie Foreman, MD       Or  . ondansetron [facility]) injection 4 mg  4 mg Intravenous Q6H PRN Harrie Foreman, MD      . piperacillin-tazobactam (ZOSYN) IVPB 3.375 g  3.375 g Intravenous 3 times per day Flora Lipps, MD   3.375 g at 05/20/15 1507  . pravastatin (PRAVACHOL) tablet 10 mg  10 mg Oral Daily Harrie Foreman, MD   10 mg at 04/28/2015 1006  . pregabalin (LYRICA) capsule 50 mg  50 mg Oral TID Harrie Foreman, MD   50 mg at 05/13/2015 2125  . propofol (DIPRIVAN) 1000 MG/100ML infusion  0-50 mcg/kg/min Intravenous Continuous Vishal Mungal, MD 32.4 mL/hr at 05/20/15 1703 50 mcg/kg/min at 05/20/15 1703  . senna-docusate (Senokot-S) tablet 1 tablet  1 tablet Oral BID Vishal Mungal, MD      . tiotropium (SPIRIVA) inhalation capsule 18 mcg  18 mcg Inhalation Daily Harrie Foreman, MD      . vancomycin (VANCOCIN) IVPB 1000 mg/200 mL premix  1,000 mg Intravenous Q24H Nicholes Mango, MD          Allergies: Allergies  Allergen Reactions  . Atorvastatin Other (See Comments)    Does not remember why she had Intolerance to Lipitor.  . Fentanyl Itching  . Rosuvastatin Anxiety    Chest tigtness and feeling as if she had heartburn.      Past Medical History: Past Medical History  Diagnosis Date  . Anxiety   . Arthritis   . COPD (chronic obstructive pulmonary disease) ([place])   . CHF (congestive heart failure) ([place])   . Hyperlipidemia   . Hypertension   . Diabetes mellitus without complication ([place])   . Chronic kidney disease   . Neuromuscular disorder  [facility])      Past Surgical History: Past Surgical History  Procedure Laterality Date  . Joint replacement Bilateral 2006    both knees  . Ankle surgery       Family History: Family History  Problem Relation Age of Onset  . Diabetes Mother   . Cancer Father   . Diabetes Sister   . Stroke Sister      Social History: Social History   Social History  . Marital Status: Married    Spouse Name: N/A  . Number of Children: N/A  . Years of Education: N/A   Occupational History  . Not on file.   Social History Main Topics  . Smoking status: Current Every Day Smoker    Types: Cigarettes  . Smokeless tobacco: Not on file     Comment: 0.5 pack /day  . Alcohol Use: No  . Drug Use: No  . Sexual Activity: Not on file   Other Topics Concern  . Not on file   Social History Narrative   Lives at home with husband.   Has a walker     Review of Systems: Patient unable to provide as shes on the ventilator  Vital Signs: Blood pressure 80/52, pulse 105, temperature 97.1 F (36.2 C), temperature source Oral, resp. rate 19, height '5\' 7"'$  (1.702 m), weight 108 kg (238 lb 1.6 oz), SpO2 92 %.  Weight trends: [name]  Weights   05/16/2015 0029 05/15/2015 0900  Weight: 108.7 kg (239 lb 10.2 oz) 108 kg (238 lb 1.6 oz)    Physical Exam: General: Critically ill appearing  Head: Normocephalic, atraumatic.  Eyes: Anicteric, pupils 14m and sluggish to react  Nose: Mucous membranes moist, not inflammed, nonerythematous.  Throat: ETT in place  Neck: Supple trachea midline  Lungs:  Bilateral rhonchi, vent assisted  Heart: S1S2 tachycardic  Abdomen:  BS normoactive. Soft, Nondistended, non-tender.  No masses or organomegaly.  Extremities: Trace b/l LE edema  Neurologic: Currently intubated and on the vent  Skin: No visible rashes, scars.    Lab results: Basic Metabolic Panel:  Recent Labs Lab 04/29/2015 0036 05/20/15 0500  NA 139 139  K 4.7 4.1  CL 94* 99*  CO2 36* 30  GLUCOSE  211* 188*  BUN 27* 39*  CREATININE 1.82* 2.14*  CALCIUM 8.9 8.4*    Liver Function Tests:  Recent Labs Lab 05/09/2015 0036  AST 24  ALT 13*  ALKPHOS 79  BILITOT 0.8  PROT 7.1  ALBUMIN 3.4*   No results for input(s): LIPASE, AMYLASE in the last 168 hours. No results for input(s): AMMONIA in the last 168 hours.  CBC:  Recent Labs Lab 05/10/2015 0036 05/20/15 0500  WBC 6.7 4.7  HGB 15.5 12.9  HCT 47.6* 40.0  MCV 92.7 92.7  PLT 108* 91*    Cardiac Enzymes:  Recent Labs Lab 05/06/2015 0036  TROPONINI <0.03    BNP: Invalid input(s): POCBNP  CBG:  Recent Labs Lab 05/11/2015 1214 05/24/2015 1655 05/26/2015 2121 05/20/15 0720 05/20/15 1134  GLUCAP 160* 199* 156* 201* 166*    Microbiology: Results for orders placed or performed during the hospital encounter of 04/30/2015  MRSA PCR Screening     Status: None   Collection Time: 04/28/2015 12:28 AM  Result Value Ref Range Status   MRSA by PCR NEGATIVE NEGATIVE Final    Comment:        The GeneXpert MRSA Assay (FDA approved for NASAL specimens only), is one component of a comprehensive MRSA colonization surveillance program. It is not intended to diagnose MRSA infection nor to guide or monitor treatment for MRSA infections.   Culture, blood (routine x 2)     Status: None (Preliminary result)   Collection Time: 05/06/2015  1:13 AM  Result Value Ref Range Status   Specimen Description BLOOD RIGHT HAND  Final   Special Requests BOTTLES DRAWN AEROBIC AND ANAEROBIC 3CC  Final   Culture NO GROWTH 1 DAY  Final   Report Status PENDING  Incomplete  Culture, blood (routine x 2)     Status: None (Preliminary result)   Collection Time: 05/10/2015  1:13 AM  Result Value Ref Range Status   Specimen Description BLOOD RIGHT ASSIST CONTROL  Final   Special Requests BOTTLES DRAWN AEROBIC AND ANAEROBIC 4CC  Final   Culture NO GROWTH 1 DAY  Final   Report Status PENDING  Incomplete  Culture, expectorated sputum-assessment      Status: None   Collection Time: 05/20/15  2:00 PM  Result Value Ref Range Status   Specimen Description EXPECTORATED SPUTUM  Final   Special Requests NONE  Final   Sputum evaluation THIS SPECIMEN IS ACCEPTABLE FOR SPUTUM CULTURE  Final   Report Status 05/20/2015 FINAL  Final    Coagulation Studies: No results for input(s): LABPROT, INR in the last 72 hours.  Urinalysis: No results for input(s): COLORURINE, LABSPEC, [place]  NITRITE, LEUKOCYTESUR in the last 72 hours.  Invalid input(s): APPERANCEUR    Imaging: Ct Chest Wo Contrast  05/20/2015  CLINICAL DATA:  69 year old who presented with acute hypercapnic respiratory failure, current history of COPD and CHF, stage 3 chronic kidney disease, with an enlarging right pleural effusion and right paratracheal soft tissue on chest x-ray yesterday. EXAM: CT CHEST WITHOUT CONTRAST TECHNIQUE: Multidetector CT imaging of the chest was performed following the standard protocol without IV contrast. Intravenous contrast was not administered due to the patient's chronic kidney disease. COMPARISON:  No prior CT.  Chest x-rays 04/26/2015 and earlier. FINDINGS: Best seen on coronal reformatted images is abrupt occlusion of the right upper lobe bronchus and the right bronchus intermedius centrally. As a result, there is dense consolidation in the entire right lung with a areas of hypoattenuation giving a "drowned lung" appearance. Without intravenous contrast, it is difficult to visualize the large obstructing mass as it has similar attenuation to the consolidated lung. There is an associated small to moderate sized right pleural effusion. No pulmonary parenchymal nodules or masses involving the left lung. Linear scarring involving the medial left lower lobe. No confluent airspace consolidation in the left lung. No left pleural effusion. Apparent pleural thickening throughout the left hemithorax is  related to abundant subpleural fat. Bulky right paratracheal conglomerate lymphadenopathy in station 2R and station 4R measuring approximately 5.6 x 5.0 x 7.4 cm. Enlarged lymph nodes elsewhere in the right side of the mediastinum. Enlarged right retroclavicular lymph nodes, the largest measuring approximately 2.8 x 3.8 x 3.1 cm. The bulky mediastinal lymphadenopathy compresses the superior vena cava, and there is a prominent right internal mammary vein collateral. Heart size upper normal. No pericardial effusion. Mild LAD and right coronary artery atherosclerosis. Moderate atherosclerosis involving the thoracic and upper abdominal aorta and their visualized branches. Approximate 3 cm cyst involving the posterior segment right lobe of liver. Within the limits of the unenhanced technique, no solid mass involving visualized liver. Approximate 1.6 x 2.6 x 2.2 cm low-attenuation mass involving the left adrenal gland. Solitary punctate calcification involving the proximal body of the pancreas. Visualized upper abdomen otherwise unremarkable for the unenhanced technique. IMPRESSION: 1. Obstructing central mass involving the right lung with occlusion of the right upper lobe bronchus and the bronchus intermedius with complete atelectasis of the right lung. The mass is difficult to visualize as it is of similar attenuation to the atelectatic lung. Bronchoscopy is recommended in further evaluation and for diagnosis. 2. Small to moderate-sized right pleural effusion. 3. Metastatic mediastinal lymphadenopathy as detailed above, the largest conglomerate nodal mass in the right paratracheal region. 4. Mild compression of the superior vena cava with a prominent right internal mammary vein collateral. 5. Approximate 3 cm cyst involving the posterior segment right lobe of the visualized liver. 6. Approximate 2.6 cm low-attenuation mass involving the left adrenal gland, likely adenoma. Electronically Signed   By: Evangeline Dakin  M.D.   On: 05/20/2015 12:41   Dg Chest Port 1 View  05/20/2015  CLINICAL DATA:  Hypoxia EXAM: PORTABLE CHEST 1 VIEW COMPARISON:  Chest CT obtained earlier in the day; chest radiograph May 19, 2015 FINDINGS: Endotracheal tube tip is 4.6 cm above the carina. Nasogastric tube tip and side port are below the diaphragm. No pneumothorax. There is now complete opacification of the right hemi thorax, felt to be due to a combination of effusion and consolidation. Left lung is clear. Heart size is within normal limits. There is atherosclerotic change in the  aorta. Mass lesions seen on CT are obscured on the right by the diffuse consolidation and effusion. IMPRESSION: Tube positions as described without pneumothorax. Left lung clear. Complete opacification on the right. Cardiac silhouette within normal limits. Atherosclerotic calcification noted. Electronically Signed   By: Lowella Grip III M.D.   On: 05/20/2015 13:18   Dg Chest Portable 1 View  04/30/2015  CLINICAL DATA:  69 year old female with increasing shortness of breath Coll the today. EXAM: PORTABLE CHEST 1 VIEW COMPARISON:  Chest x-ray 04/24/2015. FINDINGS: Enlarging right pleural effusion. Extensive opacities throughout the right mid to lower lung, which may simply reflect passive atelectasis, however, underlying airspace consolidation or underlying mass is not excluded. Persistent prominence of right paratracheal soft tissue highly concerning for lymphadenopathy. Left lung appears relatively clear, although the left base is poorly visualized medially (likely technique related). No evidence of pulmonary edema. Heart size is normal. Atherosclerotic calcifications in the thoracic aorta. IMPRESSION: 1. Overall, there has been significant progression compared to the prior chest x-ray from 04/24/2015, with enlarging now a large right pleural effusion and worsening aeration throughout the right lung. This is unusual in the setting of a treated pneumonia,  and given the presence of paratracheal soft tissue thickening on the right which is suspicious for lymphadenopathy, underlying neoplasm is strongly suspected. Further evaluation with contrast enhanced chest CT in is strongly recommended in the near future to evaluate for underlying neoplasm. 2. Atherosclerosis. Electronically Signed   By: Vinnie Langton M.D.   On: 05/15/2015 01:27   Dg Abd Portable 1v  05/20/2015  CLINICAL DATA:  OG an indentation. EXAM: PORTABLE ABDOMEN - 1 VIEW COMPARISON:  None. FINDINGS: Enteric tube tip is adequately positioned in the body of the stomach. Mildly prominent gas-filled loops of small bowel are noted within the right lower quadrant. Overall bowel gas pattern is indeterminate. No evidence of free intraperitoneal air seen. Large dense opacity noted at the right lung base. IMPRESSION: 1. OG tube appears adequately positioned with tip in the region of the stomach body. 2. Large dense opacity at the right lung base, incompletely imaged, which could be consolidation or effusion. Electronically Signed   By: Franki Cabot M.D.   On: 05/20/2015 13:19      Assessment & Plan: Pt is a 69 y.o. yo female with a PMHx of anxiety, osteoarthritis, COPD, congestive heart failure, hyperlipidemia, hypertension, diabetes mellitus, chronic and disease stage III Baseline creatinine 1.1, who was admitted to [facility] on 04/29/2015 for evaluation of shortness of breath.   1.  Acute renal failure due to ATN. 2.  CKD stage III baseline Cr 1.1 3.  Hypotension, likely due to sedation. 4.  Acute respiratory failure. 5.  New right sided lung mass s/p bronchoscopy 05/20/15.  Plan:   The patient appears to be critically ill at this point in time. She originally presented with acute respiratory failure. Right-sided lung mass which is causing obstruction has likely contributed to this. In regards to her acute renal failure her baseline creatinine is 1.1 and creatinine is currently up to 2.1. However  she has also developed oliguria. We suspect that she has acute tubular necrosis at this time. We would recommend obtaining renal ultrasound to make sure there is no underlying renal obstruction. Patient is being administered 250 cc bolus at present given hypotension. We would recommend initiation of pressors if map comes 65 or less. Renal function is likely to worsen tomorrow. If oliguria persists we may need to consider renal replacement therapy however we  will hold off for now. Management of her acute respiratory failure secondary to pulmonary critical care.  I would like to thank Dr. Margaretmary Eddy for the kind consult.

## 2015-05-20 NOTE — Progress Notes (Signed)
Spoke with Dr Josefa Half regarding patients SVT possible PAT runs every 20 minutes intermittently. Per MD will follow patient in the AM 05/21/15. Dr Stevenson Clinch in room when SVT's started during procedure Bronchoscopy.

## 2015-05-21 ENCOUNTER — Inpatient Hospital Stay: Payer: Medicare Other

## 2015-05-21 ENCOUNTER — Inpatient Hospital Stay (HOSPITAL_COMMUNITY)
Admit: 2015-05-21 | Discharge: 2015-05-21 | Disposition: A | Discharge status: Home / Self Care | Payer: Medicare Other | Attending: Internal Medicine | Admitting: Internal Medicine

## 2015-05-21 DIAGNOSIS — R400 Somnolence: Secondary | ICD-10-CM

## 2015-05-21 DIAGNOSIS — N170 Acute kidney failure with tubular necrosis: Secondary | ICD-10-CM

## 2015-05-21 DIAGNOSIS — I471 Supraventricular tachycardia: Secondary | ICD-10-CM

## 2015-05-21 DIAGNOSIS — R918 Other nonspecific abnormal finding of lung field: Secondary | ICD-10-CM

## 2015-05-21 DIAGNOSIS — Z9911 Dependence on respirator [ventilator] status: Secondary | ICD-10-CM

## 2015-05-21 DIAGNOSIS — I340 Nonrheumatic mitral (valve) insufficiency: Secondary | ICD-10-CM

## 2015-05-21 DIAGNOSIS — R34 Anuria and oliguria: Secondary | ICD-10-CM

## 2015-05-21 DIAGNOSIS — J80 Acute respiratory distress syndrome: Secondary | ICD-10-CM

## 2015-05-21 DIAGNOSIS — E279 Disorder of adrenal gland, unspecified: Secondary | ICD-10-CM

## 2015-05-21 LAB — BLOOD GAS, ARTERIAL
ACID-BASE EXCESS: 7.6 mmol/L — AB (ref 0.0–3.0)
Allens test (pass/fail): POSITIVE — AB
BICARBONATE: 33.7 meq/L — AB (ref 21.0–28.0)
FIO2: 0.75
LHR: 18 {breaths}/min
O2 SAT: 95.8 %
PEEP/CPAP: 7 cmH2O
PH ART: 7.42 (ref 7.350–7.450)
Patient temperature: 37
VT: 500 mL
pCO2 arterial: 52 mmHg — ABNORMAL HIGH (ref 32.0–48.0)
pO2, Arterial: 79 mmHg — ABNORMAL LOW (ref 83.0–108.0)

## 2015-05-21 LAB — CBC
HEMATOCRIT: 37.7 % (ref 35.0–47.0)
Hemoglobin: 12.5 g/dL (ref 12.0–16.0)
MCH: 30.7 pg (ref 26.0–34.0)
MCHC: 33.3 g/dL (ref 32.0–36.0)
MCV: 92.2 fL (ref 80.0–100.0)
PLATELETS: 84 10*3/uL — AB (ref 150–440)
RBC: 4.09 MIL/uL (ref 3.80–5.20)
RDW: 18.5 % — ABNORMAL HIGH (ref 11.5–14.5)
WBC: 5.6 10*3/uL (ref 3.6–11.0)

## 2015-05-21 LAB — BASIC METABOLIC PANEL
ANION GAP: 9 (ref 5–15)
BUN: 45 mg/dL — AB (ref 6–20)
CALCIUM: 8.4 mg/dL — AB (ref 8.9–10.3)
CO2: 30 mmol/L (ref 22–32)
CREATININE: 1.91 mg/dL — AB (ref 0.44–1.00)
Chloride: 100 mmol/L — ABNORMAL LOW (ref 101–111)
GFR calc Af Amer: 30 mL/min — ABNORMAL LOW (ref >60)
GFR calc non Af Amer: 26 mL/min — ABNORMAL LOW (ref >60)
Glucose, Bld: 166 mg/dL — ABNORMAL HIGH (ref 65–99)
POTASSIUM: 4 mmol/L (ref 3.5–5.1)
Sodium: 139 mmol/L (ref 135–145)

## 2015-05-21 LAB — GLUCOSE, CAPILLARY
Glucose-Capillary: 161 mg/dL — ABNORMAL HIGH (ref 65–99)
Glucose-Capillary: 153 mg/dL — ABNORMAL HIGH (ref 65–99)
Glucose-Capillary: 152 mg/dL — ABNORMAL HIGH (ref 65–99)
Glucose-Capillary: 193 mg/dL — ABNORMAL HIGH (ref 65–99)

## 2015-05-21 MED ORDER — FREE WATER
200.0000 mL | Freq: Three times a day (TID) | Status: DC
  Administered 2015-05-21 – 2015-05-24 (×9): 200 mL

## 2015-05-21 MED ORDER — PANTOPRAZOLE SODIUM 40 MG IV SOLR
40.0000 mg | Freq: Two times a day (BID) | INTRAVENOUS | Status: DC
  Administered 2015-05-21 – 2015-05-23 (×5): 40 mg via INTRAVENOUS
  Filled 2015-05-21 (×6): qty 40

## 2015-05-21 MED ORDER — VITAL HIGH PROTEIN PO LIQD
1000.0000 mL | ORAL | Status: DC
  Administered 2015-05-21 – 2015-05-27 (×6): 1000 mL

## 2015-05-21 NOTE — Progress Notes (Signed)
ANTIBIOTIC CONSULT NOTE - INITIAL  Pharmacy Consult for Vancomycin/Zosyn Indication: pneumonia  Allergies  Allergen Reactions  . Atorvastatin Other (See Comments)    Does not remember why she had Intolerance to Lipitor.  . Fentanyl Itching  . Rosuvastatin Anxiety    Chest tigtness and feeling as if she had heartburn.    Patient Measurements: Height: '5\' 7"'$  (170.2 cm) Weight: 232 lb 12.9 oz (105.6 kg) IBW/kg (Calculated) : 61.6 Adjusted Body Weight: 80.2 kg  Vital Signs: Temp: 98.5 F (36.9 C) (10/26 0700) Temp Source: Oral (10/26 0500) BP: 142/74 mmHg (10/26 0700) Pulse Rate: 89 (10/26 0700) Intake/Output from previous day: 10/25 0701 - 10/26 0700 In: 1142.8 [I.V.:1142.8] Out: 750 [Urine:750] Intake/Output from this shift:    Labs:  Recent Labs  05/05/2015 0036 05/20/15 0500 05/20/15 1925 05/21/15 0450  WBC 6.7 4.7  --  5.6  HGB 15.5 12.9  --  12.5  PLT 108* 91*  --  84*  CREATININE 1.82* 2.14* 2.10* 1.91*   Estimated Creatinine Clearance: 35.7 mL/min (by C-G formula based on Cr of 1.91). No results for input(s): VANCOTROUGH, VANCOPEAK, VANCORANDOM, GENTTROUGH, GENTPEAK, GENTRANDOM, TOBRATROUGH, TOBRAPEAK, TOBRARND, AMIKACINPEAK, AMIKACINTROU, AMIKACIN in the last 72 hours.   Microbiology: Recent Results (from the past 720 hour(s))  Blood culture (routine x 2)     Status: None   Collection Time: 04/24/15  6:31 PM  Result Value Ref Range Status   Specimen Description BLOOD LEFT ASSIST CONTROL  Final   Special Requests BOTTLES DRAWN AEROBIC AND ANAEROBIC  4CC  Final   Culture NO GROWTH 6 DAYS  Final   Report Status 04/30/2015 FINAL  Final  Blood culture (routine x 2)     Status: None   Collection Time: 04/24/15  6:31 PM  Result Value Ref Range Status   Specimen Description BLOOD LEFT HAND  Final   Special Requests BOTTLES DRAWN AEROBIC AND ANAEROBIC  2CC  Final   Culture NO GROWTH 6 DAYS  Final   Report Status 04/30/2015 FINAL  Final  MRSA PCR Screening      Status: None   Collection Time: 05/08/2015 12:28 AM  Result Value Ref Range Status   MRSA by PCR NEGATIVE NEGATIVE Final    Comment:        The GeneXpert MRSA Assay (FDA approved for NASAL specimens only), is one component of a comprehensive MRSA colonization surveillance program. It is not intended to diagnose MRSA infection nor to guide or monitor treatment for MRSA infections.   Culture, blood (routine x 2)     Status: None (Preliminary result)   Collection Time: 04/28/2015  1:13 AM  Result Value Ref Range Status   Specimen Description BLOOD RIGHT HAND  Final   Special Requests BOTTLES DRAWN AEROBIC AND ANAEROBIC 3CC  Final   Culture NO GROWTH 1 DAY  Final   Report Status PENDING  Incomplete  Culture, blood (routine x 2)     Status: None (Preliminary result)   Collection Time: 05/18/2015  1:13 AM  Result Value Ref Range Status   Specimen Description BLOOD RIGHT ASSIST CONTROL  Final   Special Requests BOTTLES DRAWN AEROBIC AND ANAEROBIC 4CC  Final   Culture NO GROWTH 1 DAY  Final   Report Status PENDING  Incomplete  Culture, expectorated sputum-assessment     Status: None   Collection Time: 05/20/15  2:00 PM  Result Value Ref Range Status   Specimen Description EXPECTORATED SPUTUM  Final   Special Requests NONE  Final  Sputum evaluation THIS SPECIMEN IS ACCEPTABLE FOR SPUTUM CULTURE  Final   Report Status 05/20/2015 FINAL  Final  Culture, respiratory (NON-Expectorated)     Status: None (Preliminary result)   Collection Time: 05/20/15  2:00 PM  Result Value Ref Range Status   Specimen Description EXPECTORATED SPUTUM  Final   Special Requests NONE Reflexed from [id number]  Final   Gram Stain PENDING  Incomplete   Culture APPEARS TO BE NORMAL FLORA  Final   Report Status PENDING  Incomplete  Culture, bal-quantitative     Status: None (Preliminary result)   Collection Time: 05/20/15  5:05 PM  Result Value Ref Range Status   Specimen Description BRONCHIAL ALVEOLAR LAVAGE  Final    Special Requests Normal  Final   Gram Stain PENDING  Incomplete   Culture NO GROWTH < 24 HOURS  Final   Report Status PENDING  Incomplete    Medical History: Past Medical History  Diagnosis Date  . Anxiety   . Arthritis   . COPD (chronic obstructive pulmonary disease) ([place])   . CHF (congestive heart failure) ([place])   . Hyperlipidemia   . Hypertension   . Diabetes mellitus without complication ([place])   . Chronic kidney disease   . Neuromuscular disorder (H[facility])     Medications:  Scheduled:  . antiseptic oral rinse  7 mL Mouth Rinse q12n4p  . antiseptic oral rinse  7 mL Mouth Rinse QID  . azithromycin  500 mg Intravenous Q24H  . budesonide (PULMICORT) nebulizer solution  0.5 mg Nebulization BID  . chlorhexidine  15 mL Mouth Rinse BID  . chlorhexidine gluconate  15 mL Mouth Rinse BID  . colchicine  0.6 mg Oral Daily  . free water  200 mL Per Tube 3 times per day  . insulin aspart  0-5 Units Subcutaneous QHS  . insulin aspart  0-9 Units Subcutaneous TID WC  . ipratropium-albuterol  3 mL Nebulization Q6H  . linagliptin  5 mg Oral Daily  . losartan  100 mg Oral Daily  . methylPREDNISolone (SOLU-MEDROL) injection  60 mg Intravenous 3 times per day  . metoprolol  100 mg Oral BID  . midazolam  2 mg Intravenous Once  . piperacillin-tazobactam (ZOSYN)  IV  3.375 g Intravenous 3 times per day  . pravastatin  10 mg Oral Daily  . pregabalin  50 mg Oral TID  . senna-docusate  1 tablet Oral BID  . vancomycin  1,000 mg Intravenous Q24H   Infusions:  . sodium chloride 75 mL/hr at 05/21/15 0400  . feeding supplement (VITAL HIGH PROTEIN)    . propofol (DIPRIVAN) infusion 50 mcg/kg/min (05/21/15 0744)   Assessment: 69 y/o F with acute respiratory failure due to COPD and CHF exacerbation and possible PNA.   Goal of Therapy:  Vancomycin trough level 15-20 mcg/ml  Plan:  Will continue vancomycin dosing of 1000 mg iv q 24 hours. Trough scheduled with the 4th dose representing steady state.  Will continue Zosyn 3.375 g EI q 8 hours. Will f/u renal function and culture results.   Ulice Dash D 05/21/2015,1:43 PM

## 2015-05-21 NOTE — Consult Note (Signed)
[facility]  [phone] [phone]  ID: JOZALYN BAGLIO OB: 1945-12-22  MR#: [id number]  YIR#:[id number]  Patient Care Team: Dagoberto Ligas, MD as PCP - General (Internal Medicine)  CHIEF COMPLAINT:  Chief Complaint  Patient presents with  . Respiratory Distress    INTERVAL HISTORY: Patient is a 69 year old female who presented to the emergency room complaining of increasing shortness of breath. She became increasingly more somnolent and subsequently required intubation. Upon evaluation, patient had CT scan of her chest which revealed a large right lung mass compressing her bronchus. Patient remains intubated and sedated and there are no family members at bedside. Review of systems is unobtainable.  REVIEW OF SYSTEMS:   Review of Systems  Unable to perform ROS: intubated    As per HPI. Otherwise, a complete review of systems is negatve.  PAST MEDICAL HISTORY: Past Medical History  Diagnosis Date  . Anxiety   . Arthritis   . COPD (chronic obstructive pulmonary disease) ([place])   . CHF (congestive heart failure) ([place])   . Hyperlipidemia   . Hypertension   . Diabetes mellitus without complication ([place])   . Chronic kidney disease   . Neuromuscular disorder ([place])     PAST SURGICAL HISTORY: Past Surgical History  Procedure Laterality Date  . Joint replacement Bilateral 2006    both knees  . Ankle surgery      FAMILY HISTORY Family History  Problem Relation Age of Onset  . Diabetes Mother   . Cancer Father   . Diabetes Sister   . Stroke Sister        ADVANCED DIRECTIVES:    HEALTH MAINTENANCE: Social History  Substance Use Topics  . Smoking status: Current Every Day Smoker    Types: Cigarettes  . Smokeless tobacco: None     Comment: 0.5 pack /day  . Alcohol Use: No     Colonoscopy:  PAP:  Bone density:  Lipid panel:  Allergies  Allergen Reactions  . Atorvastatin Other (See Comments)    Does not remember why she  had Intolerance to Lipitor.  . Fentanyl Itching  . Rosuvastatin Anxiety    Chest tigtness and feeling as if she had heartburn.    Current Facility-Administered Medications  Medication Dose Route Frequency Provider Last Rate Last Dose  . 0.9 %  sodium chloride infusion   Intravenous Continuous Vishal Mungal, MD 75 mL/hr at 05/21/15 0400    . acetaminophen (TYLENOL) tablet 650 mg  650 mg Oral Q6H PRN Harrie Foreman, MD       Or  . acetaminophen (TYLENOL) suppository 650 mg  650 mg Rectal Q6H PRN Harrie Foreman, MD      . antiseptic oral rinse (CPC / CETYLPYRIDINIUM CHLORIDE 0.05%) solution 7 mL  7 mL Mouth Rinse q12n4p Nicholes Mango, MD   7 mL at 05/18/2015 1720  . antiseptic oral rinse solution (CORINZ)  7 mL Mouth Rinse QID Vishal Mungal, MD   7 mL at 05/21/15 1318  . azithromycin (ZITHROMAX) 500 mg in dextrose 5 % 250 mL IVPB  500 mg Intravenous Q24H Charlett Nose, RPH   500 mg at 05/20/15 1815  . budesonide (PULMICORT) nebulizer solution 0.5 mg  0.5 mg Nebulization BID Flora Lipps, MD   0.5 mg at 05/21/15 0849  . chlorhexidine (PERIDEX) 0.12 % solution 15 mL  15 mL Mouth Rinse BID Nicholes Mango, MD   15 mL at 05/21/15 0920  . chlorhexidine gluconate (PERIDEX) 0.12 % solution 15 mL  15 mL Mouth Rinse BID Vishal Mungal, MD   15 mL at 05/20/15 2031  . colchicine tablet 0.6 mg  0.6 mg Oral Daily Harrie Foreman, MD   0.6 mg at 05/21/15 0920  . feeding supplement (VITAL HIGH PROTEIN) liquid 1,000 mL  1,000 mL Per Tube Continuous Vishal Mungal, MD      . free water 200 mL  200 mL Per Tube 3 times per day Vishal Mungal, MD      . insulin aspart (novoLOG) injection 0-5 Units  0-5 Units Subcutaneous QHS Nicholes Mango, MD   0 Units at 05/18/2015 2249  . insulin aspart (novoLOG) injection 0-9 Units  0-9 Units Subcutaneous TID WC Nicholes Mango, MD   2 Units at 05/21/15 1318  . ipratropium-albuterol (DUONEB) 0.5-2.5 (3) MG/3ML nebulizer solution 3 mL  3 mL Nebulization Q4H PRN Vishal Mungal, MD      .  ipratropium-albuterol (DUONEB) 0.5-2.5 (3) MG/3ML nebulizer solution 3 mL  3 mL Nebulization Q6H Vishal Mungal, MD   3 mL at 05/21/15 1350  . linagliptin (TRADJENTA) tablet 5 mg  5 mg Oral Daily Harrie Foreman, MD   5 mg at 05/21/15 0920  . losartan (COZAAR) tablet 100 mg  100 mg Oral Daily Harrie Foreman, MD   100 mg at 05/21/15 0920  . methylPREDNISolone sodium succinate (SOLU-MEDROL) 125 mg/2 mL injection 60 mg  60 mg Intravenous 3 times per day Nicholes Mango, MD   60 mg at 05/21/15 1317  . metoprolol (LOPRESSOR) tablet 100 mg  100 mg Oral BID Harrie Foreman, MD   100 mg at 05/21/15 0920  . midazolam (VERSED) injection 1 mg  1 mg Intravenous Q15 min PRN Vishal Mungal, MD      . midazolam (VERSED) injection 1 mg  1 mg Intravenous Q2H PRN Vishal Mungal, MD      . midazolam (VERSED) injection 2 mg  2 mg Intravenous Once Vishal Mungal, MD   2 mg at 05/20/15 1615  . morphine 2 MG/ML injection 2 mg  2 mg Intravenous Q4H PRN Harrie Foreman, MD   2 mg at 05/20/15 0325  . ondansetron (ZOFRAN) tablet 4 mg  4 mg Oral Q6H PRN Harrie Foreman, MD       Or  . ondansetron [facility]) injection 4 mg  4 mg Intravenous Q6H PRN Harrie Foreman, MD      . piperacillin-tazobactam (ZOSYN) IVPB 3.375 g  3.375 g Intravenous 3 times per day Flora Lipps, MD   3.375 g at 05/21/15 4098  . pravastatin (PRAVACHOL) tablet 10 mg  10 mg Oral Daily Harrie Foreman, MD   10 mg at 05/21/15 1310  . pregabalin (LYRICA) capsule 50 mg  50 mg Oral TID Harrie Foreman, MD   50 mg at 05/21/15 0919  . propofol (DIPRIVAN) 1000 MG/100ML infusion  0-50 mcg/kg/min Intravenous Continuous Vishal Mungal, MD 32.4 mL/hr at 05/21/15 0744 50 mcg/kg/min at 05/21/15 0744  . senna-docusate (Senokot-S) tablet 1 tablet  1 tablet Oral BID Vilinda Boehringer, MD   1 tablet at 05/21/15 0920  . vancomycin (VANCOCIN) IVPB 1000 mg/200 mL premix  1,000 mg Intravenous Q24H Nicholes Mango, MD   1,000 mg at 05/20/15 2102    OBJECTIVE: Filed Vitals:    05/21/15 0700  BP: 142/74  Pulse: 89  Temp: 98.5 F (36.9 C)  Resp: 18     Body mass index is 36.45 kg/(m^2).    ECOG FS:4 - Bedbound  General: Ill-appearing,  intubated and sedated. HEENT: ET tube in place  Lungs: Clear to auscultation bilaterally. Heart: Regular rate and rhythm. No rubs, murmurs, or gallops. Abdomen: Soft, nontender, nondistended. No organomegaly noted, normoactive bowel sounds. Musculoskeletal: No edema, cyanosis, or clubbing. Neuro: Intubated and sedated. Skin: No rashes or petechiae noted.   LAB RESULTS:  Lab Results  Component Value Date   NA 139 05/21/2015   K 4.0 05/21/2015   CL 100* 05/21/2015   CO2 30 05/21/2015   GLUCOSE 166* 05/21/2015   BUN 45* 05/21/2015   CREATININE 1.91* 05/21/2015   CALCIUM 8.4* 05/21/2015   PROT 7.1 05/26/2015   ALBUMIN 3.4* 05/21/2015   AST 24 05/05/2015   ALT 13* 05/06/2015   ALKPHOS 79 05/06/2015   BILITOT 0.8 05/25/2015   GFRNONAA 26* 05/21/2015   GFRAA 30* 05/21/2015    Lab Results  Component Value Date   WBC 5.6 05/21/2015   NEUTROABS 4.1 04/24/2015   HGB 12.5 05/21/2015   HCT 37.7 05/21/2015   MCV 92.2 05/21/2015   PLT 84* 05/21/2015     STUDIES: Ct Chest Wo Contrast  05/20/2015  CLINICAL DATA:  69 year old who presented with acute hypercapnic respiratory failure, current history of COPD and CHF, stage 3 chronic kidney disease, with an enlarging right pleural effusion and right paratracheal soft tissue on chest x-ray yesterday. EXAM: CT CHEST WITHOUT CONTRAST TECHNIQUE: Multidetector CT imaging of the chest was performed following the standard protocol without IV contrast. Intravenous contrast was not administered due to the patient's chronic kidney disease. COMPARISON:  No prior CT.  Chest x-rays 05/26/2015 and earlier. FINDINGS: Best seen on coronal reformatted images is abrupt occlusion of the right upper lobe bronchus and the right bronchus intermedius centrally. As a result, there is dense  consolidation in the entire right lung with a areas of hypoattenuation giving a "drowned lung" appearance. Without intravenous contrast, it is difficult to visualize the large obstructing mass as it has similar attenuation to the consolidated lung. There is an associated small to moderate sized right pleural effusion. No pulmonary parenchymal nodules or masses involving the left lung. Linear scarring involving the medial left lower lobe. No confluent airspace consolidation in the left lung. No left pleural effusion. Apparent pleural thickening throughout the left hemithorax is related to abundant subpleural fat. Bulky right paratracheal conglomerate lymphadenopathy in station 2R and station 4R measuring approximately 5.6 x 5.0 x 7.4 cm. Enlarged lymph nodes elsewhere in the right side of the mediastinum. Enlarged right retroclavicular lymph nodes, the largest measuring approximately 2.8 x 3.8 x 3.1 cm. The bulky mediastinal lymphadenopathy compresses the superior vena cava, and there is a prominent right internal mammary vein collateral. Heart size upper normal. No pericardial effusion. Mild LAD and right coronary artery atherosclerosis. Moderate atherosclerosis involving the thoracic and upper abdominal aorta and their visualized branches. Approximate 3 cm cyst involving the posterior segment right lobe of liver. Within the limits of the unenhanced technique, no solid mass involving visualized liver. Approximate 1.6 x 2.6 x 2.2 cm low-attenuation mass involving the left adrenal gland. Solitary punctate calcification involving the proximal body of the pancreas. Visualized upper abdomen otherwise unremarkable for the unenhanced technique. IMPRESSION: 1. Obstructing central mass involving the right lung with occlusion of the right upper lobe bronchus and the bronchus intermedius with complete atelectasis of the right lung. The mass is difficult to visualize as it is of similar attenuation to the atelectatic lung.  Bronchoscopy is recommended in further evaluation and for diagnosis. 2. Small to moderate-sized  right pleural effusion. 3. Metastatic mediastinal lymphadenopathy as detailed above, the largest conglomerate nodal mass in the right paratracheal region. 4. Mild compression of the superior vena cava with a prominent right internal mammary vein collateral. 5. Approximate 3 cm cyst involving the posterior segment right lobe of the visualized liver. 6. Approximate 2.6 cm low-attenuation mass involving the left adrenal gland, likely adenoma. Electronically Signed   By: Evangeline Dakin M.D.   On: 05/20/2015 12:41   US Renal  05/21/2015  CLINICAL DATA:  Acute renal failure EXAM: RENAL / URINARY TRACT ULTRASOUND COMPLETE COMPARISON:  None. FINDINGS: Right Kidney: Length: 11 cm. Echogenicity within normal limits. No mass or hydronephrosis visualized. There is cortical thinning probable due to atrophy Left Kidney: Length: 12.2 cm. Cortical thinning is noted probable due to atrophy. Echogenicity within normal limits. No mass or hydronephrosis visualized. Bladder: The urinary bladder is decompressed with Foley catheter. IMPRESSION: 1. No hydronephrosis. No renal calculi. Bilateral cortical thinning probable due to atrophy. Decompressed urinary bladder with Foley catheter. Electronically Signed   By: Lahoma Crocker M.D.   On: 05/21/2015 09:25   Dg Chest Port 1 View  05/21/2015  CLINICAL DATA:  Respiratory failure, intubated EXAM: PORTABLE CHEST 1 VIEW COMPARISON:  05/20/2015 FINDINGS: Cardiomegaly again noted. Stable endotracheal and NG tube position. Again noted almost complete opacification of the right hemi thorax. Chronic elevation of the right hemidiaphragm. Mild left basilar atelectasis. No convincing pulmonary edema. No pneumothorax. Atherosclerotic calcifications of thoracic aorta again noted. IMPRESSION: Stable endotracheal and NG tube position. Again noted almost complete opacification of the right hemi thorax.  Chronic elevation of the right hemidiaphragm. Mild left basilar atelectasis. No convincing pulmonary edema. No pneumothorax. Atherosclerotic calcifications of thoracic aorta again noted. Electronically Signed   By: Lahoma Crocker M.D.   On: 05/21/2015 08:22   Dg Chest Port 1 View  05/20/2015  CLINICAL DATA:  Acute respiratory failure with hypoxia. Status post bronchoscopy and line placement. EXAM: PORTABLE CHEST 1 VIEW COMPARISON:  05/20/2015 at 1302 hours FINDINGS: Following bronchoscopy, there is now partial aeration of the right lung noted in the right upper to mid lung. There is also some aeration now noted in the right lower lung with a band of opacity noted across the mid lung bordering the minor fissure. Left lung is hyperexpanded but essentially clear. No pneumothorax. Endotracheal tube is stable with its tip 4.7 cm above the carina. Orogastric tube passes below the diaphragm. IMPRESSION: 1. Improved right lung aeration following bronchoscopy. No pneumothorax. 2. No other change. Electronically Signed   By: Lajean Manes M.D.   On: 05/20/2015 17:42   Dg Chest Port 1 View  05/20/2015  CLINICAL DATA:  Hypoxia EXAM: PORTABLE CHEST 1 VIEW COMPARISON:  Chest CT obtained earlier in the day; chest radiograph May 19, 2015 FINDINGS: Endotracheal tube tip is 4.6 cm above the carina. Nasogastric tube tip and side port are below the diaphragm. No pneumothorax. There is now complete opacification of the right hemi thorax, felt to be due to a combination of effusion and consolidation. Left lung is clear. Heart size is within normal limits. There is atherosclerotic change in the aorta. Mass lesions seen on CT are obscured on the right by the diffuse consolidation and effusion. IMPRESSION: Tube positions as described without pneumothorax. Left lung clear. Complete opacification on the right. Cardiac silhouette within normal limits. Atherosclerotic calcification noted. Electronically Signed   By: Lowella Grip  III M.D.   On: 05/20/2015 13:18   Dg Chest Portable  1 View  05/26/2015  CLINICAL DATA:  69 year old female with increasing shortness of breath Coll the today. EXAM: PORTABLE CHEST 1 VIEW COMPARISON:  Chest x-ray 04/24/2015. FINDINGS: Enlarging right pleural effusion. Extensive opacities throughout the right mid to lower lung, which may simply reflect passive atelectasis, however, underlying airspace consolidation or underlying mass is not excluded. Persistent prominence of right paratracheal soft tissue highly concerning for lymphadenopathy. Left lung appears relatively clear, although the left base is poorly visualized medially (likely technique related). No evidence of pulmonary edema. Heart size is normal. Atherosclerotic calcifications in the thoracic aorta. IMPRESSION: 1. Overall, there has been significant progression compared to the prior chest x-ray from 04/24/2015, with enlarging now a large right pleural effusion and worsening aeration throughout the right lung. This is unusual in the setting of a treated pneumonia, and given the presence of paratracheal soft tissue thickening on the right which is suspicious for lymphadenopathy, underlying neoplasm is strongly suspected. Further evaluation with contrast enhanced chest CT in is strongly recommended in the near future to evaluate for underlying neoplasm. 2. Atherosclerosis. Electronically Signed   By: Vinnie Langton M.D.   On: 05/24/2015 01:27   Dg Chest Portable 1 View  04/24/2015  CLINICAL DATA:  Return productive cough for, shortness of breath for the last 2 weeks. EXAM: PORTABLE CHEST 1 VIEW COMPARISON:  July 14, 2009 FINDINGS: The mediastinal contour is stable. The heart size is enlarged. There is right upper lobe pneumonia. There is also consolidation of right lung base, pneumonia is not excluded. The left lung is clear. The bony structures are stable. IMPRESSION: Right upper lobe pneumonia. In addition, there is consolidation right lung  base, pneumonia is not excluded. Electronically Signed   By: Abelardo Diesel M.D.   On: 04/24/2015 18:02   Dg Abd Portable 1v  05/20/2015  CLINICAL DATA:  OG an indentation. EXAM: PORTABLE ABDOMEN - 1 VIEW COMPARISON:  None. FINDINGS: Enteric tube tip is adequately positioned in the body of the stomach. Mildly prominent gas-filled loops of small bowel are noted within the right lower quadrant. Overall bowel gas pattern is indeterminate. No evidence of free intraperitoneal air seen. Large dense opacity noted at the right lung base. IMPRESSION: 1. OG tube appears adequately positioned with tip in the region of the stomach body. 2. Large dense opacity at the right lung base, incompletely imaged, which could be consolidation or effusion. Electronically Signed   By: Franki Cabot M.D.   On: 05/20/2015 13:19    ASSESSMENT: Large right lung mass highly concerning for underlying malignancy.  PLAN:    1. Lung mass: Highly suspicious for underlying malignancy. Patient had bronchoscopy with biopsies yesterday and pathology results are pending. Given the compression of the bronchus, patient will definitely benefit from palliative XRT with or without chemotherapy once a diagnosis is obtained. If patient's performance status improves, can then do a full diagnostic workup to assess for metastatic disease which would include a PET scan as an outpatient. Patient will also require imaging of her brain once she is extubated. Chemotherapy is not an option at this point, but if her performance status improves would considered in the future. Case discussed at length with thoracic oncology as well as pulmonology. Recommend radiation oncology consult for further evaluation.  Appreciate consult, will follow.   Lloyd Huger, MD   05/21/2015 1:54 PM

## 2015-05-21 NOTE — Consult Note (Signed)
[facility] Consultation      Name: Emily Pratt MRN: [id number] DOB: 1945/12/15    ADMISSION DATE:  05/03/2015  CHIEF COMPLAINT:  Lethargic and resp distress   HISTORY OF PRESENT ILLNESS 05/11/2015  69 yo obese white female admitted to ICU for acute resp failure  The patient presents via EMS complaining of shortness of breath. She reportedly had oxygen saturations as low as 70% when EMS arrived. The patient was on BiPAP at that time. FiO2 was increased to 100% and oxygen saturations improved to only 79%. In the emergency department patient was immediately initiated on BiPAP. She had aggressively become more somnolent.  Chest x-ray showed worsening pleural effusion. Patient on biPAP fio2 at 50% now      SIGNIFICANT EVENTS  CT chest R lung mass, intubated on 10/25, bronch with biopsy, overnight with some tachycardia but spontaneously resolved.    ICU admission 10/24 10/25>>CT Chest with R lung mass >>intubated 10/25>>bronchoscopy with forceps biopsy of RMS and RUL carina (forceps and FNA)  PAST MEDICAL HISTORY    :  Past Medical History  Diagnosis Date  . Anxiety   . Arthritis   . COPD (chronic obstructive pulmonary disease) ([place])   . CHF (congestive heart failure) ([place])   . Hyperlipidemia   . Hypertension   . Diabetes mellitus without complication ([place])   . Chronic kidney disease   . Neuromuscular disorder [facility])    Past Surgical History  Procedure Laterality Date  . Joint replacement Bilateral 2006    both knees  . Ankle surgery     Prior to Admission medications   Medication Sig Start Date End Date Taking? Authorizing Provider  albuterol (PROVENTIL) (2.5 MG/3ML) 0.083% nebulizer solution Take 3 mLs (2.5 mg total) by nebulization every 6 (six) hours as needed for wheezing or shortness of breath. 05/07/15  Yes Roselee Nova, MD  allopurinol (ZYLOPRIM) 300 MG tablet Take 1 tablet (300 mg total) by mouth daily. 03/06/15  Yes Roselee Nova,  MD  budesonide-formoterol [facility]) 160-4.5 MCG/ACT inhaler Inhale 2 puffs into the lungs 2 (two) times daily. 04/24/15  Yes Roselee Nova, MD  colchicine 0.6 MG tablet Take 1 tablet (0.6 mg total) by mouth daily. 03/06/15  Yes Roselee Nova, MD  furosemide (LASIX) 80 MG tablet Take 80 mg by mouth daily.  11/06/14  Yes Historical Provider, MD  JANUVIA 50 MG tablet Take 1 tablet (50 mg total) by mouth daily. 03/06/15  Yes Roselee Nova, MD  losartan (COZAAR) 100 MG tablet Take 1 tablet (100 mg total) by mouth daily. 03/06/15  Yes Roselee Nova, MD  LYRICA 50 MG capsule Take 1 capsule (50 mg total) by mouth 3 (three) times daily. 03/06/15  Yes Roselee Nova, MD  metoprolol (LOPRESSOR) 100 MG tablet Take 1 tablet (100 mg total) by mouth 2 (two) times daily. 03/06/15  Yes Roselee Nova, MD  oxyCODONE-acetaminophen (PERCOCET) 10-325 MG tablet Take 1 tablet by mouth every 6 (six) hours as needed for pain. 05/07/15  Yes Roselee Nova, MD  pravastatin (PRAVACHOL) 10 MG tablet Take 10 mg by mouth daily.   Yes Historical Provider, MD  tiotropium (SPIRIVA HANDIHALER) 18 MCG inhalation capsule Place 1 capsule (18 mcg total) into inhaler and inhale daily. 05/09/15  Yes Roselee Nova, MD  VENTOLIN HFA 108 (90 BASE) MCG/ACT inhaler Inhale 1 puff into the lungs every 4 (four) hours as  needed.  03/25/15  Yes Historical Provider, MD  predniSONE (DELTASONE) 10 MG tablet Take 1 tablet (10 mg total) by mouth daily with breakfast. Patient not taking: Reported on 05/07/2015 04/25/15   Bettey Costa, MD   Allergies  Allergen Reactions  . Atorvastatin Other (See Comments)    Does not remember why she had Intolerance to Lipitor.  . Fentanyl Itching  . Rosuvastatin Anxiety    Chest tigtness and feeling as if she had heartburn.     FAMILY HISTORY   Family History  Problem Relation Age of Onset  . Diabetes Mother   . Cancer Father   . Diabetes Sister   . Stroke Sister       SOCIAL HISTORY     reports that she has been smoking Cigarettes.  She does not have any smokeless tobacco history on file. She reports that she does not drink alcohol or use illicit drugs.  Review of Systems  Unable to perform ROS: critical illness      VITAL SIGNS    Temp:  [97.1 F (36.2 C)-99 F (37.2 C)] 98.5 F (36.9 C) (10/26 0700) Pulse Rate:  [84-117] 89 (10/26 0700) Resp:  [9-19] 18 (10/26 0700) BP: (80-142)/(49-78) 142/74 mmHg (10/26 0700) SpO2:  [91 %-100 %] 97 % (10/26 0700) FiO2 (%):  [75 %-100 %] 75 % (10/26 0306) Weight:  [232 lb 12.9 oz (105.6 kg)] 232 lb 12.9 oz (105.6 kg) (10/26 0520) HEMODYNAMICS:   VENTILATOR SETTINGS: Vent Mode:  [-] PRVC FiO2 (%):  [75 %-100 %] 75 % Set Rate:  [16 bmp-18 bmp] 18 bmp Vt Set:  [500 mL] 500 mL PEEP:  [5 cmH20-7 cmH20] 7 cmH20 INTAKE / OUTPUT:  Intake/Output Summary (Last 24 hours) at 05/21/15 0813 Last data filed at 05/21/15 0973  Gross per 24 hour  Intake 1142.8 ml  Output    750 ml  Net  392.8 ml       PHYSICAL EXAM   Physical Exam  Constitutional: She appears well-developed and well-nourished.  HENT:  Head: Normocephalic and atraumatic.  Eyes: EOM are normal. Pupils are equal, round, and reactive to light.  Neck: Normal range of motion. Neck supple.  Cardiovascular: Normal rate, regular rhythm and normal heart sounds.   No murmur heard. Pulmonary/Chest:  Intubated. Shallow BS at the bases, dec BS at on the right, mild coarse upper airway sounds.   Abdominal: Soft. Bowel sounds are normal.  Musculoskeletal: Normal range of motion. She exhibits no edema.  Neurological: She displays normal reflexes. Coordination normal.  Lethargic,responds to vocal stimuli   Skin: Skin is warm.  Psychiatric: She has a normal mood and affect.  Nursing note and vitals reviewed.      LABS    Recent Labs Lab 04/27/2015 0036 05/20/15 0500 05/21/15 0450  WBC 6.7 4.7 5.6  HGB 15.5 12.9 12.5  HCT 47.6* 40.0 37.7  PLT 108* 91* 84*    Coag's No results for input(s): APTT, INR in the last 168 hours. BMET  Recent Labs Lab 05/20/15 0500 05/20/15 1925 05/21/15 0450  NA 139 138 139  K 4.1 3.7 4.0  CL 99* 99* 100*  CO2 '30 29 30  '$ BUN 39* 43* 45*  CREATININE 2.14* 2.10* 1.91*  GLUCOSE 188* 178* 166*   Electrolytes  Recent Labs Lab 05/20/15 0500 05/20/15 1925 05/21/15 0450  CALCIUM 8.4* 8.2* 8.4*   Sepsis Markers  Recent Labs Lab 04/30/2015 0113 05/08/2015 0410  LATICACIDVEN 2.3* 1.0   ABG  Recent Labs Lab  04/27/2015 1215 05/20/15 0935 05/20/15 1430  PHART 7.29* 7.45 7.37  PCO2ART 75* 46 59*  PO2ART 60* 62* 62*   Liver Enzymes  Recent Labs Lab 05/02/2015 0036  AST 24  ALT 13*  ALKPHOS 79  BILITOT 0.8  ALBUMIN 3.4*   Cardiac Enzymes  Recent Labs Lab 05/16/2015 0036  TROPONINI <0.03   Glucose  Recent Labs Lab 04/28/2015 2121 05/20/15 0720 05/20/15 1134 05/20/15 1806 05/20/15 2149 05/21/15 0708  GLUCAP 156* 201* 166* 167* 190* 161*     Recent Results (from the past 240 hour(s))  MRSA PCR Screening     Status: None   Collection Time: 05/14/2015 12:28 AM  Result Value Ref Range Status   MRSA by PCR NEGATIVE NEGATIVE Final    Comment:        The GeneXpert MRSA Assay (FDA approved for NASAL specimens only), is one component of a comprehensive MRSA colonization surveillance program. It is not intended to diagnose MRSA infection nor to guide or monitor treatment for MRSA infections.   Culture, blood (routine x 2)     Status: None (Preliminary result)   Collection Time: 05/13/2015  1:13 AM  Result Value Ref Range Status   Specimen Description BLOOD RIGHT HAND  Final   Special Requests BOTTLES DRAWN AEROBIC AND ANAEROBIC 3CC  Final   Culture NO GROWTH 1 DAY  Final   Report Status PENDING  Incomplete  Culture, blood (routine x 2)     Status: None (Preliminary result)   Collection Time: 04/27/2015  1:13 AM  Result Value Ref Range Status   Specimen Description BLOOD RIGHT ASSIST  CONTROL  Final   Special Requests BOTTLES DRAWN AEROBIC AND ANAEROBIC 4CC  Final   Culture NO GROWTH 1 DAY  Final   Report Status PENDING  Incomplete  Culture, expectorated sputum-assessment     Status: None   Collection Time: 05/20/15  2:00 PM  Result Value Ref Range Status   Specimen Description EXPECTORATED SPUTUM  Final   Special Requests NONE  Final   Sputum evaluation THIS SPECIMEN IS ACCEPTABLE FOR SPUTUM CULTURE  Final   Report Status 05/20/2015 FINAL  Final     Current facility-administered medications:  .  0.9 %  sodium chloride infusion, , Intravenous, Continuous, Jaclynne Baldo, MD, Last Rate: 75 mL/hr at 05/21/15 0400 .  acetaminophen (TYLENOL) tablet 650 mg, 650 mg, Oral, Q6H PRN **OR** acetaminophen (TYLENOL) suppository 650 mg, 650 mg, Rectal, Q6H PRN, Harrie Foreman, MD .  antiseptic oral rinse (CPC / CETYLPYRIDINIUM CHLORIDE 0.05%) solution 7 mL, 7 mL, Mouth Rinse, q12n4p, Nicholes Mango, MD, 7 mL at 05/12/2015 1720 .  antiseptic oral rinse solution (CORINZ), 7 mL, Mouth Rinse, QID, Juno Alers, MD, 7 mL at 05/21/15 0400 .  azithromycin (ZITHROMAX) 500 mg in dextrose 5 % 250 mL IVPB, 500 mg, Intravenous, Q24H, Charlett Nose, RPH, 500 mg at 05/20/15 1815 .  budesonide (PULMICORT) nebulizer solution 0.5 mg, 0.5 mg, Nebulization, BID, Flora Lipps, MD, 0.5 mg at 05/20/15 1958 .  chlorhexidine (PERIDEX) 0.12 % solution 15 mL, 15 mL, Mouth Rinse, BID, Nicholes Mango, MD, 15 mL at 05/11/2015 2125 .  chlorhexidine gluconate (PERIDEX) 0.12 % solution 15 mL, 15 mL, Mouth Rinse, BID, Shylyn Younce, MD, 15 mL at 05/20/15 2031 .  colchicine tablet 0.6 mg, 0.6 mg, Oral, Daily, Harrie Foreman, MD, 0.6 mg at 04/26/2015 1007 .  insulin aspart (novoLOG) injection 0-5 Units, 0-5 Units, Subcutaneous, QHS, Nicholes Mango, MD, 0 Units at 05/15/2015  2249 .  insulin aspart (novoLOG) injection 0-9 Units, 0-9 Units, Subcutaneous, TID WC, Nicholes Mango, MD, 2 Units at 05/21/15 0745 .  ipratropium-albuterol  (DUONEB) 0.5-2.5 (3) MG/3ML nebulizer solution 3 mL, 3 mL, Nebulization, Q4H PRN, Brannon Levene, MD .  ipratropium-albuterol (DUONEB) 0.5-2.5 (3) MG/3ML nebulizer solution 3 mL, 3 mL, Nebulization, Q6H, Kathi Dohn, MD, 3 mL at 05/21/15 0304 .  linagliptin (TRADJENTA) tablet 5 mg, 5 mg, Oral, Daily, Harrie Foreman, MD, 5 mg at 05/11/2015 1006 .  losartan (COZAAR) tablet 100 mg, 100 mg, Oral, Daily, Harrie Foreman, MD, 100 mg at 05/05/2015 1007 .  methylPREDNISolone sodium succinate (SOLU-MEDROL) 125 mg/2 mL injection 60 mg, 60 mg, Intravenous, 3 times per day, Nicholes Mango, MD, 60 mg at 05/21/15 0610 .  metoprolol (LOPRESSOR) tablet 100 mg, 100 mg, Oral, BID, Harrie Foreman, MD, 100 mg at 05/20/15 2120 .  midazolam (VERSED) injection 1 mg, 1 mg, Intravenous, Q15 min PRN, Olyn Landstrom, MD .  midazolam (VERSED) injection 1 mg, 1 mg, Intravenous, Q2H PRN, Elhadji Pecore, MD .  midazolam (VERSED) injection 2 mg, 2 mg, Intravenous, Once, Natalin Bible, MD, 2 mg at 05/20/15 1615 .  morphine 2 MG/ML injection 2 mg, 2 mg, Intravenous, Q4H PRN, Harrie Foreman, MD, 2 mg at 05/20/15 0325 .  ondansetron (ZOFRAN) tablet 4 mg, 4 mg, Oral, Q6H PRN **OR** ondansetron (ZOFRAN) injection 4 mg, 4 mg, Intravenous, Q6H PRN, Harrie Foreman, MD .  piperacillin-tazobactam (ZOSYN) IVPB 3.375 g, 3.375 g, Intravenous, 3 times per day, Flora Lipps, MD, 3.375 g at 05/21/15 1287 .  pravastatin (PRAVACHOL) tablet 10 mg, 10 mg, Oral, Daily, Harrie Foreman, MD, 10 mg at 05/22/2015 1006 .  pregabalin (LYRICA) capsule 50 mg, 50 mg, Oral, TID, Harrie Foreman, MD, 50 mg at 05/20/15 2120 .  propofol (DIPRIVAN) 1000 MG/100ML infusion, 0-50 mcg/kg/min, Intravenous, Continuous, Jaclyn Andy, MD, Last Rate: 32.4 mL/hr at 05/21/15 0744, 50 mcg/kg/min at 05/21/15 0744 .  senna-docusate (Senokot-S) tablet 1 tablet, 1 tablet, Oral, BID, Vilinda Boehringer, MD, 1 tablet at 05/20/15 2120 .  tiotropium (SPIRIVA) inhalation capsule 18  mcg, 18 mcg, Inhalation, Daily, Harrie Foreman, MD .  vancomycin (VANCOCIN) IVPB 1000 mg/200 mL premix, 1,000 mg, Intravenous, Q24H, Nicholes Mango, MD, 1,000 mg at 05/20/15 2102  IMAGING    Ct Chest Wo Contrast  05/20/2015  CLINICAL DATA:  69 year old who presented with acute hypercapnic respiratory failure, current history of COPD and CHF, stage 3 chronic kidney disease, with an enlarging right pleural effusion and right paratracheal soft tissue on chest x-ray yesterday. EXAM: CT CHEST WITHOUT CONTRAST TECHNIQUE: Multidetector CT imaging of the chest was performed following the standard protocol without IV contrast. Intravenous contrast was not administered due to the patient's chronic kidney disease. COMPARISON:  No prior CT.  Chest x-rays 05/01/2015 and earlier. FINDINGS: Best seen on coronal reformatted images is abrupt occlusion of the right upper lobe bronchus and the right bronchus intermedius centrally. As a result, there is dense consolidation in the entire right lung with a areas of hypoattenuation giving a "drowned lung" appearance. Without intravenous contrast, it is difficult to visualize the large obstructing mass as it has similar attenuation to the consolidated lung. There is an associated small to moderate sized right pleural effusion. No pulmonary parenchymal nodules or masses involving the left lung. Linear scarring involving the medial left lower lobe. No confluent airspace consolidation in the left lung. No left pleural effusion. Apparent pleural thickening throughout the  left hemithorax is related to abundant subpleural fat. Bulky right paratracheal conglomerate lymphadenopathy in station 2R and station 4R measuring approximately 5.6 x 5.0 x 7.4 cm. Enlarged lymph nodes elsewhere in the right side of the mediastinum. Enlarged right retroclavicular lymph nodes, the largest measuring approximately 2.8 x 3.8 x 3.1 cm. The bulky mediastinal lymphadenopathy compresses the superior vena  cava, and there is a prominent right internal mammary vein collateral. Heart size upper normal. No pericardial effusion. Mild LAD and right coronary artery atherosclerosis. Moderate atherosclerosis involving the thoracic and upper abdominal aorta and their visualized branches. Approximate 3 cm cyst involving the posterior segment right lobe of liver. Within the limits of the unenhanced technique, no solid mass involving visualized liver. Approximate 1.6 x 2.6 x 2.2 cm low-attenuation mass involving the left adrenal gland. Solitary punctate calcification involving the proximal body of the pancreas. Visualized upper abdomen otherwise unremarkable for the unenhanced technique. IMPRESSION: 1. Obstructing central mass involving the right lung with occlusion of the right upper lobe bronchus and the bronchus intermedius with complete atelectasis of the right lung. The mass is difficult to visualize as it is of similar attenuation to the atelectatic lung. Bronchoscopy is recommended in further evaluation and for diagnosis. 2. Small to moderate-sized right pleural effusion. 3. Metastatic mediastinal lymphadenopathy as detailed above, the largest conglomerate nodal mass in the right paratracheal region. 4. Mild compression of the superior vena cava with a prominent right internal mammary vein collateral. 5. Approximate 3 cm cyst involving the posterior segment right lobe of the visualized liver. 6. Approximate 2.6 cm low-attenuation mass involving the left adrenal gland, likely adenoma. Electronically Signed   By: Evangeline Dakin M.D.   On: 05/20/2015 12:41   Dg Chest Port 1 View  05/20/2015  CLINICAL DATA:  Acute respiratory failure with hypoxia. Status post bronchoscopy and line placement. EXAM: PORTABLE CHEST 1 VIEW COMPARISON:  05/20/2015 at 1302 hours FINDINGS: Following bronchoscopy, there is now partial aeration of the right lung noted in the right upper to mid lung. There is also some aeration now noted in the  right lower lung with a band of opacity noted across the mid lung bordering the minor fissure. Left lung is hyperexpanded but essentially clear. No pneumothorax. Endotracheal tube is stable with its tip 4.7 cm above the carina. Orogastric tube passes below the diaphragm. IMPRESSION: 1. Improved right lung aeration following bronchoscopy. No pneumothorax. 2. No other change. Electronically Signed   By: Lajean Manes M.D.   On: 05/20/2015 17:42   Dg Chest Port 1 View  05/20/2015  CLINICAL DATA:  Hypoxia EXAM: PORTABLE CHEST 1 VIEW COMPARISON:  Chest CT obtained earlier in the day; chest radiograph May 19, 2015 FINDINGS: Endotracheal tube tip is 4.6 cm above the carina. Nasogastric tube tip and side port are below the diaphragm. No pneumothorax. There is now complete opacification of the right hemi thorax, felt to be due to a combination of effusion and consolidation. Left lung is clear. Heart size is within normal limits. There is atherosclerotic change in the aorta. Mass lesions seen on CT are obscured on the right by the diffuse consolidation and effusion. IMPRESSION: Tube positions as described without pneumothorax. Left lung clear. Complete opacification on the right. Cardiac silhouette within normal limits. Atherosclerotic calcification noted. Electronically Signed   By: Lowella Grip III M.D.   On: 05/20/2015 13:18   Dg Abd Portable 1v  05/20/2015  CLINICAL DATA:  OG an indentation. EXAM: PORTABLE ABDOMEN - 1 VIEW COMPARISON:  None. FINDINGS: Enteric tube tip is adequately positioned in the body of the stomach. Mildly prominent gas-filled loops of small bowel are noted within the right lower quadrant. Overall bowel gas pattern is indeterminate. No evidence of free intraperitoneal air seen. Large dense opacity noted at the right lung base. IMPRESSION: 1. OG tube appears adequately positioned with tip in the region of the stomach body. 2. Large dense opacity at the right lung base, incompletely  imaged, which could be consolidation or effusion. Electronically Signed   By: Franki Cabot M.D.   On: 05/20/2015 13:19    INDWELLING DEVICES:: R Fem CVL 10/25>>  MICRO DATA: MRSA PCR >>neg BAL 10/25>>  ANTIMICROBIALS:  Zosyn/vancomycin 10/24>>    ASSESSMENT/PLAN   69 yo obese white female admitted to ICU for acute hypercapnic and hypoxic resp failure acute COPD and CHF exacerbation from probable pneumonia  PULMONARY Respiratory Failure - intubated on 10/25 - cont with MV, wean as tolerated - continue Bronchodilator Therapy COPD  And CHF exacerbation -BD therapy, IV steroids -ABG PRN Lung Mass - RUL bulky adenopathy - s/p bronch with biopsy on 10/25 - left adrenal mass seen on CT - will also need biopsy for staging.   CARDIOVASCULAR - cont with ICU monitoring - not on vasopressors  RENAL Adrenal mass - no lasix at this time - UOP improving, Cr today - 1.91 - adrenal mass will require biopsy in the near future.    GASTROINTESTINAL - start TF feeds  HEMATOLOGIC Follow CBC Thrombocytopenia - critical illness, hold heparin for now DVT Prophylaxis - SCDs  INFECTIOUS Empiric abx for pneumonia -vanc/sozyn  ENDOCRINE - ICU hypoglycemic\Hyperglycemia protocol   NEUROLOGIC - Rass -1 - CT head to evaluate for any lesions based on biopsy results from lung biopsy   I have personally obtained a history, examined the patient, evaluated laboratory and independently reviewed  imaging results, formulated the assessment and plan and placed orders.  The Patient requires high complexity decision making for assessment and support, frequent evaluation and titration of therapies, application of advanced monitoring technologies and extensive interpretation of multiple databases. Critical Care Time devoted to patient care services described in this note is 45 minutes.   Overall, patient is critically ill, prognosis is guarded. Patient at high risk for cardiac arrest and death.      Vilinda Boehringer, MD [facility] Pager [phone] (please enter 7-digits) On Call Pager - [phone] (please enter 7-digits)

## 2015-05-21 NOTE — Progress Notes (Signed)
Initial Nutrition Assessment   INTERVENTION:   EN: received verbal order from MD Mungal to start TF. Recommend starting Vital High Protein at rate of 20 ml/hr, current goal of 50 ml/hr (1200 kcals, 106 g of protein). ICU Adult Tube Feeding Protocol entered. Will need to reassess goal rate as needed based on kcals from diprivan. Continue to assess   NUTRITION DIAGNOSIS:   Inadequate oral intake related to acute illness as evidenced by NPO status.  GOAL:   Provide needs based on ASPEN/SCCM guidelines  MONITOR:    (Energy Intake, Anthropometrics, Electrolyte/Renal Profile, Digestive System, Electrolyte/Renal Profile)  REASON FOR ASSESSMENT:   Consult Enteral/tube feeding initiation and management  ASSESSMENT:    Pt admitted with acute respiratory failure with COPD, lung mass s/p bronch and biopsy, CHF; currently sedated on vent  Past Medical History  Diagnosis Date  . Anxiety   . Arthritis   . COPD (chronic obstructive pulmonary disease) ([place])   . CHF (congestive heart failure) ([place])   . Hyperlipidemia   . Hypertension   . Diabetes mellitus without complication ([place])   . Chronic kidney disease   . Neuromuscular disorder ([place])     Diet Order:  Diet NPO time specified  Digestive System: OG in place, abdomen distended/soft  Electrolyte and Renal Profile:  Recent Labs Lab 05/20/15 0500 05/20/15 1925 05/21/15 0450  BUN 39* 43* 45*  CREATININE 2.14* 2.10* 1.91*  NA 139 138 139  K 4.1 3.7 4.0   Glucose Profile:   Recent Labs  05/20/15 2149 05/21/15 0708 05/21/15 1129  GLUCAP 190* 161* 153*   Meds: ss novolog, NS at 75 ml/hr, diprivan, solumedrol  Height:   Ht Readings from Last 1 Encounters:  05/06/2015 '5\' 7"'$  (1.702 m)    Weight:   Wt Readings from Last 1 Encounters:  05/21/15 232 lb 12.9 oz (105.6 kg)    Wt Readings from Last 10 Encounters:  05/21/15 232 lb 12.9 oz (105.6 kg)  05/07/15 223 lb 12.8 oz (101.515 kg)  04/24/15 230 lb 12.8 oz  (104.69 kg)  04/24/15 229 lb 3 oz (103.959 kg)  04/10/15 233 lb 8 oz (105.915 kg)  04/03/15 228 lb 8 oz (103.647 kg)  03/06/15 231 lb 8 oz (105.008 kg)  02/06/15 240 lb 9.6 oz (109.135 kg)  01/10/15 236 lb 3 oz (107.134 kg)    BMI:  Body mass index is 36.45 kg/(m^2).  Estimated Nutritional Needs:   Kcal:  3354-5625 kcals (11-14 kcals/kg) using current wt of 105.6 kg  Protein:  92-122 g (1.5-2.0 g/kg)   Fluid:  1525-1830 mL (25-30 ml/kg)   HIGH Care Level  Kerman Passey MS, RD, LDN [phone] Pager

## 2015-05-21 NOTE — Progress Notes (Signed)
[facility]  ROUNDING NOTE   Subjective:  Pt remains critically ill at the moment. Still on the vent and requiring 75% fio2.  UOP has increased a bit and Cr did come down a bit despite hypotension yesterday. Currently on IVF hydration as well.  Objective:  Vital signs in last 24 hours:  Temp:  [97.1 F (36.2 C)-99 F (37.2 C)] 98.5 F (36.9 C) (10/26 0700) Pulse Rate:  [84-117] 89 (10/26 0700) Resp:  [9-19] 18 (10/26 0700) BP: (80-142)/(49-78) 142/74 mmHg (10/26 0700) SpO2:  [90 %-100 %] 97 % (10/26 0700) FiO2 (%):  [55 %-100 %] 75 % (10/26 0306) Weight:  [105.6 kg (232 lb 12.9 oz)] 105.6 kg (232 lb 12.9 oz) (10/26 0520)  Weight change: -2.4 kg (-5 lb 4.7 oz) Filed Weights   05/18/2015 0029 05/08/2015 0900 05/21/15 0520  Weight: 108.7 kg (239 lb 10.2 oz) 108 kg (238 lb 1.6 oz) 105.6 kg (232 lb 12.9 oz)    Intake/Output: I/O last 3 completed shifts: In: 1142.8 [I.V.:1142.8] Out: 900 [Urine:900]   Intake/Output this shift:     Physical Exam: General: Critically ill appearing  Head: Normocephalic, atraumatic. Eyes closed, ETT in place  Eyes: Eyes closed  Neck: Supple, trachea midline  Lungs:  Bilateral rhonchi, vent assisted, fio2 75%  Heart: S1S2 no rubs  Abdomen:  Soft, nontender, BS present   Extremities:  trace peripheral edema.  Neurologic: Intubated, sedated  Skin: No lesions  GU: Foley in place    Basic Metabolic Panel:  Recent Labs Lab 05/20/2015 0036 05/20/15 0500 05/20/15 1925 05/21/15 0450  NA 139 139 138 139  K 4.7 4.1 3.7 4.0  CL 94* 99* 99* 100*  CO2 36* '30 29 30  '$ GLUCOSE 211* 188* 178* 166*  BUN 27* 39* 43* 45*  CREATININE 1.82* 2.14* 2.10* 1.91*  CALCIUM 8.9 8.4* 8.2* 8.4*    Liver Function Tests:  Recent Labs Lab 05/22/2015 0036  AST 24  ALT 13*  ALKPHOS 79  BILITOT 0.8  PROT 7.1  ALBUMIN 3.4*   No results for input(s): LIPASE, AMYLASE in the last 168 hours. No results for input(s): AMMONIA in the last 168  hours.  CBC:  Recent Labs Lab 05/01/2015 0036 05/20/15 0500 05/21/15 0450  WBC 6.7 4.7 5.6  HGB 15.5 12.9 12.5  HCT 47.6* 40.0 37.7  MCV 92.7 92.7 92.2  PLT 108* 91* 84*    Cardiac Enzymes:  Recent Labs Lab 05/21/2015 0036  TROPONINI <0.03    BNP: Invalid input(s): POCBNP  CBG:  Recent Labs Lab 05/16/2015 2121 05/20/15 0720 05/20/15 1134 05/20/15 1806 05/20/15 2149  GLUCAP 156* 201* 74* 167* 190*    Microbiology: Results for orders placed or performed during the hospital encounter of 05/11/2015  MRSA PCR Screening     Status: None   Collection Time: 05/01/2015 12:28 AM  Result Value Ref Range Status   MRSA by PCR NEGATIVE NEGATIVE Final    Comment:        The GeneXpert MRSA Assay (FDA approved for NASAL specimens only), is one component of a comprehensive MRSA colonization surveillance program. It is not intended to diagnose MRSA infection nor to guide or monitor treatment for MRSA infections.   Culture, blood (routine x 2)     Status: None (Preliminary result)   Collection Time: 05/14/2015  1:13 AM  Result Value Ref Range Status   Specimen Description BLOOD RIGHT HAND  Final   Special Requests BOTTLES DRAWN AEROBIC AND ANAEROBIC 3CC  Final  Culture NO GROWTH 1 DAY  Final   Report Status PENDING  Incomplete  Culture, blood (routine x 2)     Status: None (Preliminary result)   Collection Time: 05/21/2015  1:13 AM  Result Value Ref Range Status   Specimen Description BLOOD RIGHT ASSIST CONTROL  Final   Special Requests BOTTLES DRAWN AEROBIC AND ANAEROBIC 4CC  Final   Culture NO GROWTH 1 DAY  Final   Report Status PENDING  Incomplete  Culture, expectorated sputum-assessment     Status: None   Collection Time: 05/20/15  2:00 PM  Result Value Ref Range Status   Specimen Description EXPECTORATED SPUTUM  Final   Special Requests NONE  Final   Sputum evaluation THIS SPECIMEN IS ACCEPTABLE FOR SPUTUM CULTURE  Final   Report Status 05/20/2015 FINAL  Final     Coagulation Studies: No results for input(s): LABPROT, INR in the last 72 hours.  Urinalysis: No results for input(s): COLORURINE, LABSPEC, PHURINE, GLUCOSEU, HGBUR, BILIRUBINUR, KETONESUR, PROTEINUR, UROBILINOGEN, NITRITE, LEUKOCYTESUR in the last 72 hours.  Invalid input(s): APPERANCEUR    Imaging: Ct Chest Wo Contrast  05/20/2015  CLINICAL DATA:  69 year old who presented with acute hypercapnic respiratory failure, current history of COPD and CHF, stage 3 chronic kidney disease, with an enlarging right pleural effusion and right paratracheal soft tissue on chest x-ray yesterday. EXAM: CT CHEST WITHOUT CONTRAST TECHNIQUE: Multidetector CT imaging of the chest was performed following the standard protocol without IV contrast. Intravenous contrast was not administered due to the patient's chronic kidney disease. COMPARISON:  No prior CT.  Chest x-rays 05/23/2015 and earlier. FINDINGS: Best seen on coronal reformatted images is abrupt occlusion of the right upper lobe bronchus and the right bronchus intermedius centrally. As a result, there is dense consolidation in the entire right lung with a areas of hypoattenuation giving a "drowned lung" appearance. Without intravenous contrast, it is difficult to visualize the large obstructing mass as it has similar attenuation to the consolidated lung. There is an associated small to moderate sized right pleural effusion. No pulmonary parenchymal nodules or masses involving the left lung. Linear scarring involving the medial left lower lobe. No confluent airspace consolidation in the left lung. No left pleural effusion. Apparent pleural thickening throughout the left hemithorax is related to abundant subpleural fat. Bulky right paratracheal conglomerate lymphadenopathy in station 2R and station 4R measuring approximately 5.6 x 5.0 x 7.4 cm. Enlarged lymph nodes elsewhere in the right side of the mediastinum. Enlarged right retroclavicular lymph nodes, the  largest measuring approximately 2.8 x 3.8 x 3.1 cm. The bulky mediastinal lymphadenopathy compresses the superior vena cava, and there is a prominent right internal mammary vein collateral. Heart size upper normal. No pericardial effusion. Mild LAD and right coronary artery atherosclerosis. Moderate atherosclerosis involving the thoracic and upper abdominal aorta and their visualized branches. Approximate 3 cm cyst involving the posterior segment right lobe of liver. Within the limits of the unenhanced technique, no solid mass involving visualized liver. Approximate 1.6 x 2.6 x 2.2 cm low-attenuation mass involving the left adrenal gland. Solitary punctate calcification involving the proximal body of the pancreas. Visualized upper abdomen otherwise unremarkable for the unenhanced technique. IMPRESSION: 1. Obstructing central mass involving the right lung with occlusion of the right upper lobe bronchus and the bronchus intermedius with complete atelectasis of the right lung. The mass is difficult to visualize as it is of similar attenuation to the atelectatic lung. Bronchoscopy is recommended in further evaluation and for diagnosis. 2. Small to  moderate-sized right pleural effusion. 3. Metastatic mediastinal lymphadenopathy as detailed above, the largest conglomerate nodal mass in the right paratracheal region. 4. Mild compression of the superior vena cava with a prominent right internal mammary vein collateral. 5. Approximate 3 cm cyst involving the posterior segment right lobe of the visualized liver. 6. Approximate 2.6 cm low-attenuation mass involving the left adrenal gland, likely adenoma. Electronically Signed   By: Evangeline Dakin M.D.   On: 05/20/2015 12:41   Dg Chest Port 1 View  05/20/2015  CLINICAL DATA:  Acute respiratory failure with hypoxia. Status post bronchoscopy and line placement. EXAM: PORTABLE CHEST 1 VIEW COMPARISON:  05/20/2015 at 1302 hours FINDINGS: Following bronchoscopy, there is now  partial aeration of the right lung noted in the right upper to mid lung. There is also some aeration now noted in the right lower lung with a band of opacity noted across the mid lung bordering the minor fissure. Left lung is hyperexpanded but essentially clear. No pneumothorax. Endotracheal tube is stable with its tip 4.7 cm above the carina. Orogastric tube passes below the diaphragm. IMPRESSION: 1. Improved right lung aeration following bronchoscopy. No pneumothorax. 2. No other change. Electronically Signed   By: Lajean Manes M.D.   On: 05/20/2015 17:42   Dg Chest Port 1 View  05/20/2015  CLINICAL DATA:  Hypoxia EXAM: PORTABLE CHEST 1 VIEW COMPARISON:  Chest CT obtained earlier in the day; chest radiograph May 19, 2015 FINDINGS: Endotracheal tube tip is 4.6 cm above the carina. Nasogastric tube tip and side port are below the diaphragm. No pneumothorax. There is now complete opacification of the right hemi thorax, felt to be due to a combination of effusion and consolidation. Left lung is clear. Heart size is within normal limits. There is atherosclerotic change in the aorta. Mass lesions seen on CT are obscured on the right by the diffuse consolidation and effusion. IMPRESSION: Tube positions as described without pneumothorax. Left lung clear. Complete opacification on the right. Cardiac silhouette within normal limits. Atherosclerotic calcification noted. Electronically Signed   By: Lowella Grip III M.D.   On: 05/20/2015 13:18   Dg Abd Portable 1v  05/20/2015  CLINICAL DATA:  OG an indentation. EXAM: PORTABLE ABDOMEN - 1 VIEW COMPARISON:  None. FINDINGS: Enteric tube tip is adequately positioned in the body of the stomach. Mildly prominent gas-filled loops of small bowel are noted within the right lower quadrant. Overall bowel gas pattern is indeterminate. No evidence of free intraperitoneal air seen. Large dense opacity noted at the right lung base. IMPRESSION: 1. OG tube appears adequately  positioned with tip in the region of the stomach body. 2. Large dense opacity at the right lung base, incompletely imaged, which could be consolidation or effusion. Electronically Signed   By: Franki Cabot M.D.   On: 05/20/2015 13:19     Medications:   . sodium chloride 75 mL/hr at 05/21/15 0400  . propofol (DIPRIVAN) infusion 45 mcg/kg/min (05/21/15 7654)   . antiseptic oral rinse  7 mL Mouth Rinse q12n4p  . antiseptic oral rinse  7 mL Mouth Rinse QID  . azithromycin  500 mg Intravenous Q24H  . budesonide (PULMICORT) nebulizer solution  0.5 mg Nebulization BID  . chlorhexidine  15 mL Mouth Rinse BID  . chlorhexidine gluconate  15 mL Mouth Rinse BID  . colchicine  0.6 mg Oral Daily  . insulin aspart  0-5 Units Subcutaneous QHS  . insulin aspart  0-9 Units Subcutaneous TID WC  . ipratropium-albuterol  3 mL Nebulization Q6H  . linagliptin  5 mg Oral Daily  . losartan  100 mg Oral Daily  . methylPREDNISolone (SOLU-MEDROL) injection  60 mg Intravenous 3 times per day  . metoprolol  100 mg Oral BID  . midazolam  2 mg Intravenous Once  . piperacillin-tazobactam (ZOSYN)  IV  3.375 g Intravenous 3 times per day  . pravastatin  10 mg Oral Daily  . pregabalin  50 mg Oral TID  . senna-docusate  1 tablet Oral BID  . tiotropium  18 mcg Inhalation Daily  . vancomycin  1,000 mg Intravenous Q24H   acetaminophen **OR** acetaminophen, ipratropium-albuterol, midazolam, midazolam, morphine injection, ondansetron **OR** ondansetron (ZOFRAN) IV  Assessment/ Plan:  69 y.o. female with a PMHx of anxiety, osteoarthritis, COPD, congestive heart failure, hyperlipidemia, hypertension, diabetes mellitus, chronic and disease stage III Baseline creatinine 1.1, who was admitted to [facility] on 05/25/2015 for evaluation of shortness of breath.   1. Acute renal failure due to ATN. 2. CKD stage III baseline Cr 1.1 3. Hypotension, likely due to sedation. 4. Acute respiratory failure. 5. New right sided lung  mass s/p bronchoscopy 05/20/15.  Plan:  [facility] remains [facility] ill at the [facility].  Cr down to 1.91 this AM.  UOP found to be 750cc over the past 24 hours. Therefore at this time no acute indication for HD.  Renal US pending. Hypotension improved this AM.  Continue IVF hydration at this time.  Remains on the vent with high fio2 requirement.  Weaning per Dr. Stevenson Clinch. Overall prognosis quite guarded at this point in time. Will follow with you.   LOS: 2 Daleah Coulson 10/26/20167:16 AM

## 2015-05-21 NOTE — Clinical Social Work Note (Signed)
Clinical Social Work Assessment  Patient Details  Name: Emily Pratt MRN: [id number] Date of Birth: 09/27/1945  Date of referral:  05/21/15               Reason for consult:  Family Concerns                Permission sought to share information with:   (patient currently on vent) Permission granted to share information::     Name::        Agency::     Relationship::     Contact Information:     Housing/Transportation Living arrangements for the past 2 months:  Single Family Home Source of Information:   (Sister) Patient Interpreter Needed:  None Criminal Activity/Legal Involvement Pertinent to Current Situation/Hospitalization:  No - Comment as needed Significant Relationships:  Siblings, Spouse Lives with:  Spouse Do you feel safe going back to the place where you live?   (patient currently on ventilator) Need for family participation in patient care:  Yes (Comment)  Care giving concerns:  Patient resides with spouse.   Social Worker assessment / plan:  CSW was able to reach patient's sister this afternoon and patient's sister states that patient lives with her husband and that patient does have 2 adult sons but that they are not in contact with patient much. She stated that Rivka Barbara, who the hospital as listed as a son is actually patient's ex son in law (patient had a daughter that passed away 4 years ago). She states Mr. Felton Clinton has been helpful with getting patient to and from the hospital and that today patient's husband was brought up by his brother. Patient's sister states she has told patient's husband that he cannot spend the night and will need to return home in the evenings. Patient's sister also stated that patient's husband is an alcoholic and that patient has done everything for him since they were married. Patient's sister stated that patient's husband looked much better today and was clean and appropriate. Patients' sister stated that patient's husband has had his  leg wounds for over 2 years and that they will not heal. Patient's sister is aware that patient's husband is the legal contact and decision maker for patient at this time as patient's sister verified that there was no legal paperwork establishing her as a Media planner.   CSW spoke with patient's nurse today and she stated that she saw patient's husband yesterday and that there was a difference in his appearance today and that patient was clean, hair combed, and wounds on legs were clean.   Employment status:  Retired Nurse, adult PT Recommendations:    Information / Referral to community resources:     Patient/Family's Response to care:  Patient's family willing to assist with information needed.  Patient/Family's Understanding of and Emotional Response to Diagnosis, Current Treatment, and Prognosis:  Patient not able to communicate at this time. Family is being cooperative at this time.  Emotional Assessment Appearance:  Appears stated age Attitude/Demeanor/Rapport:  Unable to Assess Affect (typically observed):  Unable to Assess Orientation:   (patient on ventilator; prior was documented as alert and oriented X4) Alcohol / Substance use:  Not Applicable Psych involvement (Current and /or in the community):  No (Comment)  Discharge Needs  Concerns to be addressed:  Care Coordination Readmission within the last 30 days:  No Current discharge risk:  None Barriers to Discharge:  No Barriers Identified   Shela Leff, LCSW  05/21/2015, 4:56 PM

## 2015-05-21 NOTE — Progress Notes (Addendum)
[facility] [place] NAME: Annalysa Mohammad    MR#:  [id number]  DATE OF BIRTH:  1946-03-24  SUBJECTIVE:  CHIEF COMPLAINT:  Intubated, sedated  REVIEW OF SYSTEMS:   Unable to obtain  DRUG ALLERGIES:   Allergies  Allergen Reactions  . Atorvastatin Other (See Comments)    Does not remember why she had Intolerance to Lipitor.  . Fentanyl Itching  . Rosuvastatin Anxiety    Chest tigtness and feeling as if she had heartburn.    VITALS:  Blood pressure 142/74, pulse 89, temperature 98.5 F (36.9 C), temperature source Oral, resp. rate 18, height '5\' 7"'$  (1.702 m), weight 105.6 kg (232 lb 12.9 oz), SpO2 96 %.  PHYSICAL EXAMINATION:  GENERAL:  69 y.o.-year-old patient lying in the bed, sedated for ventilation, obese EYES: Pupils equal, round, reactive to light and accommodation. No scleral icterus.  HEENT: Head atraumatic, normocephalic. Oropharynx and nasopharynx clear. ETT in place NECK:  Supple, no jugular venous distention. No thyroid enlargement, no tenderness.  LUNGS: Scattered wheezes, coarse breath sounds, fair air movement  CARDIOVASCULAR: S1, S2 normal. No murmurs, rubs, or gallops.  ABDOMEN: Soft, nontender, nondistended. Bowel sounds present. No organomegaly or mass.  EXTREMITIES: +1 pedal edema, cyanosis, or clubbing.  NEUROLOGIC: Sedated, withdraws from stimuli PSYCHIATRIC: Sedated SKIN: Sacral decubitus ulcer   LABORATORY PANEL:   CBC  Recent Labs Lab 05/21/15 0450  WBC 5.6  HGB 12.5  HCT 37.7  PLT 84*   ------------------------------------------------------------------------------------------------------------------  Chemistries   Recent Labs Lab 05/14/2015 0036  05/21/15 0450  NA 139  < > 139  K 4.7  < > 4.0  CL 94*  < > 100*  CO2 36*  < > 30  GLUCOSE 211*  < > 166*  BUN 27*  < > 45*  CREATININE 1.82*  < > 1.91*  CALCIUM 8.9  < > 8.4*  AST 24  --   --   ALT 13*  --   --   ALKPHOS 79  --   --    BILITOT 0.8  --   --   < > = values in this interval not displayed. ------------------------------------------------------------------------------------------------------------------  Cardiac Enzymes  Recent Labs Lab 05/15/2015 0036  TROPONINI <0.03   ------------------------------------------------------------------------------------------------------------------  RADIOLOGY:  Ct Chest Wo Contrast  05/20/2015  CLINICAL DATA:  69 year old who presented with acute hypercapnic respiratory failure, current history of COPD and CHF, stage 3 chronic kidney disease, with an enlarging right pleural effusion and right paratracheal soft tissue on chest x-ray yesterday. EXAM: CT CHEST WITHOUT CONTRAST TECHNIQUE: Multidetector CT imaging of the chest was performed following the standard protocol without IV contrast. Intravenous contrast was not administered due to the patient's chronic kidney disease. COMPARISON:  No prior CT.  Chest x-rays 05/11/2015 and earlier. FINDINGS: Best seen on coronal reformatted images is abrupt occlusion of the right upper lobe bronchus and the right bronchus intermedius centrally. As a result, there is dense consolidation in the entire right lung with a areas of hypoattenuation giving a "drowned lung" appearance. Without intravenous contrast, it is difficult to visualize the large obstructing mass as it has similar attenuation to the consolidated lung. There is an associated small to moderate sized right pleural effusion. No pulmonary parenchymal nodules or masses involving the left lung. Linear scarring involving the medial left lower lobe. No confluent airspace consolidation in the left lung. No left pleural effusion. Apparent pleural thickening throughout the left hemithorax is related to abundant subpleural  fat. Bulky right paratracheal conglomerate lymphadenopathy in station 2R and station 4R measuring approximately 5.6 x 5.0 x 7.4 cm. Enlarged lymph nodes elsewhere in the right  side of the mediastinum. Enlarged right retroclavicular lymph nodes, the largest measuring approximately 2.8 x 3.8 x 3.1 cm. The bulky mediastinal lymphadenopathy compresses the superior vena cava, and there is a prominent right internal mammary vein collateral. Heart size upper normal. No pericardial effusion. Mild LAD and right coronary artery atherosclerosis. Moderate atherosclerosis involving the thoracic and upper abdominal aorta and their visualized branches. Approximate 3 cm cyst involving the posterior segment right lobe of liver. Within the limits of the unenhanced technique, no solid mass involving visualized liver. Approximate 1.6 x 2.6 x 2.2 cm low-attenuation mass involving the left adrenal gland. Solitary punctate calcification involving the proximal body of the pancreas. Visualized upper abdomen otherwise unremarkable for the unenhanced technique. IMPRESSION: 1. Obstructing central mass involving the right lung with occlusion of the right upper lobe bronchus and the bronchus intermedius with complete atelectasis of the right lung. The mass is difficult to visualize as it is of similar attenuation to the atelectatic lung. Bronchoscopy is recommended in further evaluation and for diagnosis. 2. Small to moderate-sized right pleural effusion. 3. Metastatic mediastinal lymphadenopathy as detailed above, the largest conglomerate nodal mass in the right paratracheal region. 4. Mild compression of the superior vena cava with a prominent right internal mammary vein collateral. 5. Approximate 3 cm cyst involving the posterior segment right lobe of the visualized liver. 6. Approximate 2.6 cm low-attenuation mass involving the left adrenal gland, likely adenoma. Electronically Signed   By: Evangeline Dakin M.D.   On: 05/20/2015 12:41   US Renal  05/21/2015  CLINICAL DATA:  Acute renal failure EXAM: RENAL / URINARY TRACT ULTRASOUND COMPLETE COMPARISON:  None. FINDINGS: Right Kidney: Length: 11 cm. Echogenicity  within normal limits. No mass or hydronephrosis visualized. There is cortical thinning probable due to atrophy Left Kidney: Length: 12.2 cm. Cortical thinning is noted probable due to atrophy. Echogenicity within normal limits. No mass or hydronephrosis visualized. Bladder: The urinary bladder is decompressed with Foley catheter. IMPRESSION: 1. No hydronephrosis. No renal calculi. Bilateral cortical thinning probable due to atrophy. Decompressed urinary bladder with Foley catheter. Electronically Signed   By: Lahoma Crocker M.D.   On: 05/21/2015 09:25   Dg Chest Port 1 View  05/21/2015  CLINICAL DATA:  Respiratory failure, intubated EXAM: PORTABLE CHEST 1 VIEW COMPARISON:  05/20/2015 FINDINGS: Cardiomegaly again noted. Stable endotracheal and NG tube position. Again noted almost complete opacification of the right hemi thorax. Chronic elevation of the right hemidiaphragm. Mild left basilar atelectasis. No convincing pulmonary edema. No pneumothorax. Atherosclerotic calcifications of thoracic aorta again noted. IMPRESSION: Stable endotracheal and NG tube position. Again noted almost complete opacification of the right hemi thorax. Chronic elevation of the right hemidiaphragm. Mild left basilar atelectasis. No convincing pulmonary edema. No pneumothorax. Atherosclerotic calcifications of thoracic aorta again noted. Electronically Signed   By: Lahoma Crocker M.D.   On: 05/21/2015 08:22   Dg Chest Port 1 View  05/20/2015  CLINICAL DATA:  Acute respiratory failure with hypoxia. Status post bronchoscopy and line placement. EXAM: PORTABLE CHEST 1 VIEW COMPARISON:  05/20/2015 at 1302 hours FINDINGS: Following bronchoscopy, there is now partial aeration of the right lung noted in the right upper to mid lung. There is also some aeration now noted in the right lower lung with a band of opacity noted across the mid lung bordering the minor fissure.  Left lung is hyperexpanded but essentially clear. No pneumothorax. Endotracheal  tube is stable with its tip 4.7 cm above the carina. Orogastric tube passes below the diaphragm. IMPRESSION: 1. Improved right lung aeration following bronchoscopy. No pneumothorax. 2. No other change. Electronically Signed   By: Lajean Manes M.D.   On: 05/20/2015 17:42   Dg Chest Port 1 View  05/20/2015  CLINICAL DATA:  Hypoxia EXAM: PORTABLE CHEST 1 VIEW COMPARISON:  Chest CT obtained earlier in the day; chest radiograph May 19, 2015 FINDINGS: Endotracheal tube tip is 4.6 cm above the carina. Nasogastric tube tip and side port are below the diaphragm. No pneumothorax. There is now complete opacification of the right hemi thorax, felt to be due to a combination of effusion and consolidation. Left lung is clear. Heart size is within normal limits. There is atherosclerotic change in the aorta. Mass lesions seen on CT are obscured on the right by the diffuse consolidation and effusion. IMPRESSION: Tube positions as described without pneumothorax. Left lung clear. Complete opacification on the right. Cardiac silhouette within normal limits. Atherosclerotic calcification noted. Electronically Signed   By: Lowella Grip III M.D.   On: 05/20/2015 13:18   Dg Abd Portable 1v  05/20/2015  CLINICAL DATA:  OG an indentation. EXAM: PORTABLE ABDOMEN - 1 VIEW COMPARISON:  None. FINDINGS: Enteric tube tip is adequately positioned in the body of the stomach. Mildly prominent gas-filled loops of small bowel are noted within the right lower quadrant. Overall bowel gas pattern is indeterminate. No evidence of free intraperitoneal air seen. Large dense opacity noted at the right lung base. IMPRESSION: 1. OG tube appears adequately positioned with tip in the region of the stomach body. 2. Large dense opacity at the right lung base, incompletely imaged, which could be consolidation or effusion. Electronically Signed   By: Franki Cabot M.D.   On: 05/20/2015 13:19    EKG:   Orders placed or performed during the  hospital encounter of 05/07/2015  . EKG 12-Lead  . EKG 12-Lead    ASSESSMENT AND PLAN:   1. Acute on chronic respiratory failure with hypoxia and Hypercapnia,  - Multifactorial due to large right-sided pleural effusion probably from pneumonia/ CHF/ COPD - Pulmonology following - Continue nebulizer, steroids, antibiotics  2. Congestive heart failure: Unspecified type.  - We'll obtain echo as there is none in this record, likely diastolic  3. Acute on chronic kidney disease:  - Due to ATN, hypovolemia and hypotension - Improving - Nephrology following  4. Essential hypertension:  - Stable, continue to hold metoprolol and Cozaar  5. Diabetes mellitus type 2: Hold home medications Provide sliding scale insulin  6. Lung mass - Status post bronchoscopy and 10/25, pathology results are pending - Appreciate oncology consultation, will consult radiation oncology - Long-term history of heavy smoking  7. Postobstructive pneumonia - Blood cultures negative to date, respiratory, BAL, AFB all pending - Continue vancomycin and Zosyn  All the records are reviewed and case discussed with the patient's husband and sister at the bedside.  CODE STATUS: Full code  TOTAL CRITICAL CARE TIME TAKING CARE OF THIS PATIENT: 35 minutes.   POSSIBLE D/C IN ?  DAYS, DEPENDING ON CLINICAL CONDITION.   Myrtis Ser M.D on 05/21/2015 at 2:37 PM  Between 7am to 6pm - Pager - [phone] After 6pm go to [email or web address] - password [email or web address] [facility]  Office  [phone]  CC: Primary care physician; Dagoberto Ligas, MD

## 2015-05-21 NOTE — Consult Note (Signed)
[facility] Cardiology  CARDIOLOGY CONSULT NOTE  Patient ID: Emily Pratt MRN: [id number] DOB/AGE: 1946/05/01 69 y.o.  Admit date: 05/11/2015 Referring Physician Gouru Primary Physician  Primary Cardiologist  Reason for Consultation atrial arrhythmia  HPI: 69 year old female was admitted for shortness of breath, chest x-ray revealing right pleural effusion. This was confirmed by CT scan. The patient underwent bronchoscopy on 05/20/15 which revealed right upper lobe mass with compression of surrounding structures. During the procedure, the patient experienced brief atrial runs, SVT versus atrial fibrillation, continue with few sewed following the procedure. She currently has remained in sinus rhythm without recurrent atrial arrhythmias today.  Review of systems complete and found to be negative unless listed above     Past Medical History  Diagnosis Date  . Anxiety   . Arthritis   . COPD (chronic obstructive pulmonary disease) ([place])   . CHF (congestive heart failure) ([place])   . Hyperlipidemia   . Hypertension   . Diabetes mellitus without complication ([place])   . Chronic kidney disease   . Neuromuscular disorder [facility])     Past Surgical History  Procedure Laterality Date  . Joint replacement Bilateral 2006    both knees  . Ankle surgery      Facility-administered medications prior to admission  Medication Dose Route Frequency Provider Last Rate Last Dose  . albuterol (PROVENTIL) (2.5 MG/3ML) 0.083% nebulizer solution 2.5 mg  2.5 mg Nebulization Once Ashok Norris, MD      . albuterol (PROVENTIL) (2.5 MG/3ML) 0.083% nebulizer solution 2.5 mg  2.5 mg Nebulization Once Roselee Nova, MD       Prescriptions prior to admission  Medication Sig Dispense Refill Last Dose  . albuterol (PROVENTIL) (2.5 MG/3ML) 0.083% nebulizer solution Take 3 mLs (2.5 mg total) by nebulization every 6 (six) hours as needed for wheezing or shortness of breath. 150 mL 1 prn at prn  . allopurinol (ZYLOPRIM)  300 MG tablet Take 1 tablet (300 mg total) by mouth daily. 90 tablet 0 unknown at unknown  . budesonide-formoterol (SYMBICORT) 160-4.5 MCG/ACT inhaler Inhale 2 puffs into the lungs 2 (two) times daily. 1 Inhaler 3 unknown at unknown  . colchicine 0.6 MG tablet Take 1 tablet (0.6 mg total) by mouth daily. 90 tablet 0 unknown at unknown  . furosemide (LASIX) 80 MG tablet Take 80 mg by mouth daily.    unknown at unknown  . JANUVIA 50 MG tablet Take 1 tablet (50 mg total) by mouth daily. 90 tablet 0 unknown at unknown  . losartan (COZAAR) 100 MG tablet Take 1 tablet (100 mg total) by mouth daily. 90 tablet 0 unknown at unknown  . LYRICA 50 MG capsule Take 1 capsule (50 mg total) by mouth 3 (three) times daily. 90 capsule 2 unknown at unknown  . metoprolol (LOPRESSOR) 100 MG tablet Take 1 tablet (100 mg total) by mouth 2 (two) times daily. 180 tablet 0 unknown at unknown  . oxyCODONE-acetaminophen (PERCOCET) 10-325 MG tablet Take 1 tablet by mouth every 6 (six) hours as needed for pain. 120 tablet 0 prn at prn  . pravastatin (PRAVACHOL) 10 MG tablet Take 10 mg by mouth daily.   unknown at unknown  . tiotropium (SPIRIVA HANDIHALER) 18 MCG inhalation capsule Place 1 capsule (18 mcg total) into inhaler and inhale daily. 90 capsule 0 unknown at unknown  . VENTOLIN HFA 108 (90 BASE) MCG/ACT inhaler Inhale 1 puff into the lungs every 4 (four) hours as needed.    prn at prn  .  predniSONE (DELTASONE) 10 MG tablet Take 1 tablet (10 mg total) by mouth daily with breakfast. (Patient not taking: Reported on 04/28/2015) 30 tablet 0 Completed Course at Unknown time   Social History   Social History  . Marital Status: Married    Spouse Name: N/A  . Number of Children: N/A  . Years of Education: N/A   Occupational History  . Not on file.   Social History Main Topics  . Smoking status: Current Every Day Smoker    Types: Cigarettes  . Smokeless tobacco: Not on file     Comment: 0.5 pack /day  . Alcohol Use:  No  . Drug Use: No  . Sexual Activity: Not on file   Other Topics Concern  . Not on file   Social History Narrative   Lives at home with husband.   Has a walker    Family History  Problem Relation Age of Onset  . Diabetes Mother   . Cancer Father   . Diabetes Sister   . Stroke Sister       Review of systems complete and found to be negative unless listed above      PHYSICAL EXAM  General: Well developed, well nourished, in no acute distress HEENT:  Normocephalic and atramatic Neck:  No JVD.  Lungs: Clear bilaterally to auscultation and percussion. Heart: HRRR . Normal S1 and S2 without gallops or murmurs.  Abdomen: Bowel sounds are positive, abdomen soft and non-tender  Msk:  Back normal, normal gait. Normal strength and tone for age. Extremities: No clubbing, cyanosis or edema.   Neuro: Alert and oriented X 3. Psych:  Good affect, responds appropriately  Labs:   Lab Results  Component Value Date   WBC 5.6 05/21/2015   HGB 12.5 05/21/2015   HCT 37.7 05/21/2015   MCV 92.2 05/21/2015   PLT 84* 05/21/2015    Recent Labs Lab 05/21/2015 0036  05/21/15 0450  NA 139  < > 139  K 4.7  < > 4.0  CL 94*  < > 100*  CO2 36*  < > 30  BUN 27*  < > 45*  CREATININE 1.82*  < > 1.91*  CALCIUM 8.9  < > 8.4*  PROT 7.1  --   --   BILITOT 0.8  --   --   ALKPHOS 79  --   --   ALT 13*  --   --   AST 24  --   --   GLUCOSE 211*  < > 166*  < > = values in this interval not displayed. Lab Results  Component Value Date   TROPONINI <0.03 05/03/2015   No results found for: CHOL No results found for: HDL No results found for: [facility] Lab Results  Component Value Date   TRIG 169* 05/20/2015   No results found for: CHOLHDL No results found for: LDLDIRECT    Radiology: Ct Chest Wo Contrast  05/20/2015  CLINICAL DATA:  69 year old who presented with acute hypercapnic respiratory failure, current history of COPD and CHF, stage 3 chronic kidney disease, with an enlarging right  pleural effusion and right paratracheal soft tissue on chest x-ray yesterday. EXAM: CT CHEST WITHOUT CONTRAST TECHNIQUE: Multidetector CT imaging of the chest was performed following the standard protocol without IV contrast. Intravenous contrast was not administered due to the patient's chronic kidney disease. COMPARISON:  No prior CT.  Chest x-rays 05/10/2015 and earlier. FINDINGS: Best seen on coronal reformatted images is abrupt occlusion of the right upper  lobe bronchus and the right bronchus intermedius centrally. As a result, there is dense consolidation in the entire right lung with a areas of hypoattenuation giving a "drowned lung" appearance. Without intravenous contrast, it is difficult to visualize the large obstructing mass as it has similar attenuation to the consolidated lung. There is an associated small to moderate sized right pleural effusion. No pulmonary parenchymal nodules or masses involving the left lung. Linear scarring involving the medial left lower lobe. No confluent airspace consolidation in the left lung. No left pleural effusion. Apparent pleural thickening throughout the left hemithorax is related to abundant subpleural fat. Bulky right paratracheal conglomerate lymphadenopathy in station 2R and station 4R measuring approximately 5.6 x 5.0 x 7.4 cm. Enlarged lymph nodes elsewhere in the right side of the mediastinum. Enlarged right retroclavicular lymph nodes, the largest measuring approximately 2.8 x 3.8 x 3.1 cm. The bulky mediastinal lymphadenopathy compresses the superior vena cava, and there is a prominent right internal mammary vein collateral. Heart size upper normal. No pericardial effusion. Mild LAD and right coronary artery atherosclerosis. Moderate atherosclerosis involving the thoracic and upper abdominal aorta and their visualized branches. Approximate 3 cm cyst involving the posterior segment right lobe of liver. Within the limits of the unenhanced technique, no solid  mass involving visualized liver. Approximate 1.6 x 2.6 x 2.2 cm low-attenuation mass involving the left adrenal gland. Solitary punctate calcification involving the proximal body of the pancreas. Visualized upper abdomen otherwise unremarkable for the unenhanced technique. IMPRESSION: 1. Obstructing central mass involving the right lung with occlusion of the right upper lobe bronchus and the bronchus intermedius with complete atelectasis of the right lung. The mass is difficult to visualize as it is of similar attenuation to the atelectatic lung. Bronchoscopy is recommended in further evaluation and for diagnosis. 2. Small to moderate-sized right pleural effusion. 3. Metastatic mediastinal lymphadenopathy as detailed above, the largest conglomerate nodal mass in the right paratracheal region. 4. Mild compression of the superior vena cava with a prominent right internal mammary vein collateral. 5. Approximate 3 cm cyst involving the posterior segment right lobe of the visualized liver. 6. Approximate 2.6 cm low-attenuation mass involving the left adrenal gland, likely adenoma. Electronically Signed   By: Evangeline Dakin M.D.   On: 05/20/2015 12:41   Dg Chest Port 1 View  05/21/2015  CLINICAL DATA:  Respiratory failure, intubated EXAM: PORTABLE CHEST 1 VIEW COMPARISON:  05/20/2015 FINDINGS: Cardiomegaly again noted. Stable endotracheal and NG tube position. Again noted almost complete opacification of the right hemi thorax. Chronic elevation of the right hemidiaphragm. Mild left basilar atelectasis. No convincing pulmonary edema. No pneumothorax. Atherosclerotic calcifications of thoracic aorta again noted. IMPRESSION: Stable endotracheal and NG tube position. Again noted almost complete opacification of the right hemi thorax. Chronic elevation of the right hemidiaphragm. Mild left basilar atelectasis. No convincing pulmonary edema. No pneumothorax. Atherosclerotic calcifications of thoracic aorta again noted.  Electronically Signed   By: Lahoma Crocker M.D.   On: 05/21/2015 08:22   Dg Chest Port 1 View  05/20/2015  CLINICAL DATA:  Acute respiratory failure with hypoxia. Status post bronchoscopy and line placement. EXAM: PORTABLE CHEST 1 VIEW COMPARISON:  05/20/2015 at 1302 hours FINDINGS: Following bronchoscopy, there is now partial aeration of the right lung noted in the right upper to mid lung. There is also some aeration now noted in the right lower lung with a band of opacity noted across the mid lung bordering the minor fissure. Left lung is hyperexpanded but essentially clear.  No pneumothorax. Endotracheal tube is stable with its tip 4.7 cm above the carina. Orogastric tube passes below the diaphragm. IMPRESSION: 1. Improved right lung aeration following bronchoscopy. No pneumothorax. 2. No other change. Electronically Signed   By: Lajean Manes M.D.   On: 05/20/2015 17:42   Dg Chest Port 1 View  05/20/2015  CLINICAL DATA:  Hypoxia EXAM: PORTABLE CHEST 1 VIEW COMPARISON:  Chest CT obtained earlier in the day; chest radiograph May 19, 2015 FINDINGS: Endotracheal tube tip is 4.6 cm above the carina. Nasogastric tube tip and side port are below the diaphragm. No pneumothorax. There is now complete opacification of the right hemi thorax, felt to be due to a combination of effusion and consolidation. Left lung is clear. Heart size is within normal limits. There is atherosclerotic change in the aorta. Mass lesions seen on CT are obscured on the right by the diffuse consolidation and effusion. IMPRESSION: Tube positions as described without pneumothorax. Left lung clear. Complete opacification on the right. Cardiac silhouette within normal limits. Atherosclerotic calcification noted. Electronically Signed   By: Lowella Grip III M.D.   On: 05/20/2015 13:18   Dg Chest Portable 1 View  05/01/2015  CLINICAL DATA:  69 year old female with increasing shortness of breath Coll the today. EXAM: PORTABLE CHEST 1  VIEW COMPARISON:  Chest x-ray 04/24/2015. FINDINGS: Enlarging right pleural effusion. Extensive opacities throughout the right mid to lower lung, which may simply reflect passive atelectasis, however, underlying airspace consolidation or underlying mass is not excluded. Persistent prominence of right paratracheal soft tissue highly concerning for lymphadenopathy. Left lung appears relatively clear, although the left base is poorly visualized medially (likely technique related). No evidence of pulmonary edema. Heart size is normal. Atherosclerotic calcifications in the thoracic aorta. IMPRESSION: 1. Overall, there has been significant progression compared to the prior chest x-ray from 04/24/2015, with enlarging now a large right pleural effusion and worsening aeration throughout the right lung. This is unusual in the setting of a treated pneumonia, and given the presence of paratracheal soft tissue thickening on the right which is suspicious for lymphadenopathy, underlying neoplasm is strongly suspected. Further evaluation with contrast enhanced chest CT in is strongly recommended in the near future to evaluate for underlying neoplasm. 2. Atherosclerosis. Electronically Signed   By: Vinnie Langton M.D.   On: 05/11/2015 01:27   Dg Chest Portable 1 View  04/24/2015  CLINICAL DATA:  Return productive cough for, shortness of breath for the last 2 weeks. EXAM: PORTABLE CHEST 1 VIEW COMPARISON:  July 14, 2009 FINDINGS: The mediastinal contour is stable. The heart size is enlarged. There is right upper lobe pneumonia. There is also consolidation of right lung base, pneumonia is not excluded. The left lung is clear. The bony structures are stable. IMPRESSION: Right upper lobe pneumonia. In addition, there is consolidation right lung base, pneumonia is not excluded. Electronically Signed   By: Abelardo Diesel M.D.   On: 04/24/2015 18:02   Dg Abd Portable 1v  05/20/2015  CLINICAL DATA:  OG an indentation. EXAM:  PORTABLE ABDOMEN - 1 VIEW COMPARISON:  None. FINDINGS: Enteric tube tip is adequately positioned in the body of the stomach. Mildly prominent gas-filled loops of small bowel are noted within the right lower quadrant. Overall bowel gas pattern is indeterminate. No evidence of free intraperitoneal air seen. Large dense opacity noted at the right lung base. IMPRESSION: 1. OG tube appears adequately positioned with tip in the region of the stomach body. 2. Large dense opacity at  the right lung base, incompletely imaged, which could be consolidation or effusion. Electronically Signed   By: Franki Cabot M.D.   On: 05/20/2015 13:19    EKG: Sinus rhythm  ASSESSMENT AND PLAN:   1. Atrial arrhythmias, SVT versus AF, peri- and post-bronchoscopy, currently in sinus rhythm without recurrent episodes  Recommendations  1. Continue current medications 2. Defer antiarrhythmic therapy at this time 3. Defer chronic anticoagulation, especially in light of right upper lobe mass and bleeding risks following bronchoscopy 4. Consider 2-D echocardiogram   Signed: Necie Wilcoxson MD,PhD, [facility] 05/21/2015, 8:30 AM

## 2015-05-21 NOTE — Progress Notes (Signed)
Called last night  At [place] by ICU staff due to husband in room in wheelchair needing care. He had open wound to right ankle  and had been left at hospital. Escorted husband in wheelchair out after visiting hours, assisted him to restroom and called step son to pick him up. While he was in wheelchair at visitor entrance waiting for his ride, security was called due to cursing and loud behaviors and escorted out of hospital.

## 2015-05-21 NOTE — Clinical Social Work Note (Signed)
CSW contacted by RN CM to state that during rounds this morning it was discussed that there was concern regarding contact and point person for patient as she is now on a vent. It was stated that patient's husband had to be escorted out of the building last evening by security due to cursing and loud/disruptive behavior. CSW spoke with Webb Silversmith, the house supervisor, today regarding the incident and patient's husband was reported as being alert and oriented but disheveled and had open wounds. Patient's son was contacted by staff to pick patient's husband up last evening and take him home.   CSW attempted to contact patient's sister: Gaspar Bidding: [phone] but her husband answered and stated she was running errands. CSW attempted to contact patient's son/stepson: Rivka Barbara: [phone] and his voicemail stated it was not set up and no message could be left. CSW also contacted Pershing Cox at [facility] to see if patient had an open investigation and there was no investigation open. CSW will continue to attempt to reach patient's family. There is no name or contact for patient's husband. Shela Leff MSW,LCSW [phone]

## 2015-05-22 LAB — CYTOLOGY - NON PAP

## 2015-05-22 LAB — CBC
HCT: 37.7 % (ref 35.0–47.0)
Hemoglobin: 12.4 g/dL (ref 12.0–16.0)
MCH: 30.2 pg (ref 26.0–34.0)
MCHC: 33 g/dL (ref 32.0–36.0)
MCV: 91.5 fL (ref 80.0–100.0)
PLATELETS: 72 10*3/uL — AB (ref 150–440)
RBC: 4.12 MIL/uL (ref 3.80–5.20)
RDW: 18 % — ABNORMAL HIGH (ref 11.5–14.5)
WBC: 4.5 10*3/uL (ref 3.6–11.0)

## 2015-05-22 LAB — SURGICAL PATHOLOGY

## 2015-05-22 LAB — GLUCOSE, CAPILLARY
Glucose-Capillary: 159 mg/dL — ABNORMAL HIGH (ref 65–99)
Glucose-Capillary: 173 mg/dL — ABNORMAL HIGH (ref 65–99)
Glucose-Capillary: 182 mg/dL — ABNORMAL HIGH (ref 65–99)
Glucose-Capillary: 207 mg/dL — ABNORMAL HIGH (ref 65–99)
Glucose-Capillary: 214 mg/dL — ABNORMAL HIGH (ref 65–99)
Glucose-Capillary: 222 mg/dL — ABNORMAL HIGH (ref 65–99)

## 2015-05-22 LAB — BASIC METABOLIC PANEL
Anion gap: 7 (ref 5–15)
BUN: 49 mg/dL — AB (ref 6–20)
CALCIUM: 7.9 mg/dL — AB (ref 8.9–10.3)
CO2: 29 mmol/L (ref 22–32)
CREATININE: 1.59 mg/dL — AB (ref 0.44–1.00)
Chloride: 102 mmol/L (ref 101–111)
GFR calc Af Amer: 38 mL/min — ABNORMAL LOW (ref >60)
GFR, EST NON AFRICAN AMERICAN: 33 mL/min — AB (ref >60)
Glucose, Bld: 188 mg/dL — ABNORMAL HIGH (ref 65–99)
Potassium: 3.4 mmol/L — ABNORMAL LOW (ref 3.5–5.1)
SODIUM: 138 mmol/L (ref 135–145)

## 2015-05-22 LAB — CULTURE, BAL-QUANTITATIVE W GRAM STAIN
Culture: NORMAL
Special Requests: NORMAL

## 2015-05-22 LAB — CULTURE, RESPIRATORY W GRAM STAIN

## 2015-05-22 LAB — CULTURE, RESPIRATORY: CULTURE: NORMAL

## 2015-05-22 LAB — HEPARIN INDUCED PLATELET AB (HIT ANTIBODY): HEPARIN INDUCED PLT AB: 0.196 {OD_unit} (ref 0.000–0.400)

## 2015-05-22 LAB — PROTIME-INR
INR: 1.04
Prothrombin Time: 13.8 seconds (ref 11.4–15.0)

## 2015-05-22 LAB — CULTURE, BAL-QUANTITATIVE

## 2015-05-22 LAB — APTT: aPTT: 25 seconds (ref 24–36)

## 2015-05-22 MED ORDER — INSULIN ASPART 100 UNIT/ML ~~LOC~~ SOLN
0.0000 [IU] | SUBCUTANEOUS | Status: DC
  Administered 2015-05-22: 3 [IU] via SUBCUTANEOUS
  Administered 2015-05-23 (×3): 2 [IU] via SUBCUTANEOUS
  Administered 2015-05-23: 3 [IU] via SUBCUTANEOUS
  Administered 2015-05-23 – 2015-05-24 (×3): 2 [IU] via SUBCUTANEOUS
  Administered 2015-05-24: 3 [IU] via SUBCUTANEOUS
  Administered 2015-05-24: 2 [IU] via SUBCUTANEOUS
  Filled 2015-05-22: qty 2
  Filled 2015-05-22: qty 3
  Filled 2015-05-22 (×3): qty 2
  Filled 2015-05-22: qty 3
  Filled 2015-05-22 (×3): qty 2
  Filled 2015-05-22: qty 3

## 2015-05-22 MED ORDER — DEXMEDETOMIDINE HCL IN NACL 400 MCG/100ML IV SOLN
0.0000 ug/kg/h | INTRAVENOUS | Status: AC
  Administered 2015-05-22: 0.6 ug/kg/h via INTRAVENOUS
  Administered 2015-05-22: 0.4 ug/kg/h via INTRAVENOUS
  Administered 2015-05-23: 0.6 ug/kg/h via INTRAVENOUS
  Administered 2015-05-23: 0.8 ug/kg/h via INTRAVENOUS
  Administered 2015-05-23: 0.4 ug/kg/h via INTRAVENOUS
  Administered 2015-05-23 – 2015-05-24 (×3): 0.8 ug/kg/h via INTRAVENOUS
  Administered 2015-05-24: 0.758 ug/kg/h via INTRAVENOUS
  Administered 2015-05-24 – 2015-05-25 (×4): 1 ug/kg/h via INTRAVENOUS
  Filled 2015-05-22 (×15): qty 100

## 2015-05-22 MED ORDER — POTASSIUM CHLORIDE 10 MEQ/100ML IV SOLN
10.0000 meq | INTRAVENOUS | Status: AC
  Administered 2015-05-22 (×4): 10 meq via INTRAVENOUS
  Filled 2015-05-22 (×4): qty 100

## 2015-05-22 NOTE — Progress Notes (Signed)
Pharmacy Consult for Electrolyte Monitoring   Allergies  Allergen Reactions  . Atorvastatin Other (See Comments)    Does not remember why she had Intolerance to Lipitor.  . Fentanyl Itching  . Rosuvastatin Anxiety    Chest tigtness and feeling as if she had heartburn.    Patient Measurements: Height: '5\' 7"'$  (170.2 cm) Weight: 232 lb 12.9 oz (105.6 kg) IBW/kg (Calculated) : 61.6   Vital Signs: Temp: 98.7 F (37.1 C) (10/27 0900) Temp Source: Oral (10/27 0900) BP: 133/67 mmHg (10/27 1259) Pulse Rate: 82 (10/27 1259) Intake/Output from previous day: 10/26 0701 - 10/27 0700 In: 2217.1 [I.V.:669.6; NG/GT:1247.5; IV Piggyback:300] Out: 1350 [Urine:1350] Intake/Output from this shift: Total I/O In: 2381.7 [I.V.:2281.7; IV Piggyback:100] Out: 675 [Urine:275; Emesis/NG output:400]  Labs:  Recent Labs  05/20/15 0500 05/21/15 0450 05/22/15 0407 05/22/15 1217  WBC 4.7 5.6 4.5  --   HGB 12.9 12.5 12.4  --   HCT 40.0 37.7 37.7  --   PLT 91* 84* 72*  --   INR  --   --   --  1.04     Recent Labs  05/20/15 0500 05/20/15 1925 05/21/15 0450 05/22/15 0407  NA 139 138 139 138  K 4.1 3.7 4.0 3.4*  CL 99* 99* 100* 102  CO2 '30 29 30 29  '$ GLUCOSE 188* 178* 166* 188*  BUN 39* 43* 45* 49*  CREATININE 2.14* 2.10* 1.91* 1.59*  CALCIUM 8.4* 8.2* 8.4* 7.9*  TRIG 169*  --   --   --    Estimated Creatinine Clearance: 42.9 mL/min (by C-G formula based on Cr of 1.59).    Recent Labs  05/22/15 0008 05/22/15 0548 05/22/15 0853  GLUCAP 182* 207* 222*    Medical History: Past Medical History  Diagnosis Date  . Anxiety   . Arthritis   . COPD (chronic obstructive pulmonary disease) ([place])   . CHF (congestive heart failure) ([place])   . Hyperlipidemia   . Hypertension   . Diabetes mellitus without complication ([place])   . Chronic kidney disease   . Neuromuscular disorder ([facility])     Medications:  Scheduled:  . antiseptic oral rinse  7 mL Mouth Rinse q12n4p  . antiseptic oral  rinse  7 mL Mouth Rinse QID  . azithromycin  500 mg Intravenous Q24H  . budesonide (PULMICORT) nebulizer solution  0.5 mg Nebulization BID  . chlorhexidine  15 mL Mouth Rinse BID  . chlorhexidine gluconate  15 mL Mouth Rinse BID  . colchicine  0.6 mg Oral Daily  . free water  200 mL Per Tube 3 times per day  . insulin aspart  0-5 Units Subcutaneous QHS  . insulin aspart  0-9 Units Subcutaneous TID WC  . ipratropium-albuterol  3 mL Nebulization Q6H  . linagliptin  5 mg Oral Daily  . losartan  100 mg Oral Daily  . methylPREDNISolone (SOLU-MEDROL) injection  60 mg Intravenous 3 times per day  . metoprolol  100 mg Oral BID  . midazolam  2 mg Intravenous Once  . pantoprazole (PROTONIX) IV  40 mg Intravenous Q12H  . piperacillin-tazobactam (ZOSYN)  IV  3.375 g Intravenous 3 times per day  . potassium chloride  10 mEq Intravenous Q1 Hr x 4  . pravastatin  10 mg Oral Daily  . pregabalin  50 mg Oral TID  . senna-docusate  1 tablet Oral BID  . vancomycin  1,000 mg Intravenous Q24H   Infusions:  . sodium chloride 75 mL/hr at 05/22/15 0824  .  dexmedetomidine 0.4 mcg/kg/hr (05/22/15 1307)  . feeding supplement (VITAL HIGH PROTEIN) 1,000 mL (05/22/15 0600)  . propofol (DIPRIVAN) infusion 20 mcg/kg/min (05/22/15 6834)    Assessment: Pharmacy consulted to assist in managing electrolytes in this 69 y/o F with acute respiratory failure due to CHF/COPD/PNA.   Plan:  Potassium is slightly low at 3.4 but has been replaced today with 4 runs of KCL 10 meq IV. Will f/u AM labs.   Ulice Dash D 05/22/2015,1:28 PM

## 2015-05-22 NOTE — Progress Notes (Addendum)
ANTIBIOTIC CONSULT NOTE - INITIAL  Pharmacy Consult for Vancomycin/Zosyn Indication: pneumonia  Allergies  Allergen Reactions  . Atorvastatin Other (See Comments)    Does not remember why she had Intolerance to Lipitor.  . Fentanyl Itching  . Rosuvastatin Anxiety    Chest tigtness and feeling as if she had heartburn.    Patient Measurements: Height: '5\' 7"'$  (170.2 cm) Weight: 232 lb 12.9 oz (105.6 kg) IBW/kg (Calculated) : 61.6 Adjusted Body Weight: 80.2 kg  Vital Signs: Temp: 98.7 F (37.1 C) (10/27 0900) Temp Source: Oral (10/27 0900) BP: 133/67 mmHg (10/27 1259) Pulse Rate: 82 (10/27 1259) Intake/Output from previous day: 10/26 0701 - 10/27 0700 In: 2217.1 [I.V.:669.6; NG/GT:1247.5; IV Piggyback:300] Out: 1350 [Urine:1350] Intake/Output from this shift: Total I/O In: 2381.7 [I.V.:2281.7; IV Piggyback:100] Out: 675 [Urine:275; Emesis/NG output:400]  Labs:  Recent Labs  05/20/15 0500 05/20/15 1925 05/21/15 0450 05/22/15 0407  WBC 4.7  --  5.6 4.5  HGB 12.9  --  12.5 12.4  PLT 91*  --  84* 72*  CREATININE 2.14* 2.10* 1.91* 1.59*   Estimated Creatinine Clearance: 42.9 mL/min (by C-G formula based on Cr of 1.59). No results for input(s): VANCOTROUGH, VANCOPEAK, VANCORANDOM, GENTTROUGH, GENTPEAK, GENTRANDOM, TOBRATROUGH, TOBRAPEAK, TOBRARND, AMIKACINPEAK, AMIKACINTROU, AMIKACIN in the last 72 hours.   Microbiology: Recent Results (from the past 720 hour(s))  Blood culture (routine x 2)     Status: None   Collection Time: 04/24/15  6:31 PM  Result Value Ref Range Status   Specimen Description BLOOD LEFT ASSIST CONTROL  Final   Special Requests BOTTLES DRAWN AEROBIC AND ANAEROBIC  4CC  Final   Culture NO GROWTH 6 DAYS  Final   Report Status 04/30/2015 FINAL  Final  Blood culture (routine x 2)     Status: None   Collection Time: 04/24/15  6:31 PM  Result Value Ref Range Status   Specimen Description BLOOD LEFT HAND  Final   Special Requests BOTTLES DRAWN  AEROBIC AND ANAEROBIC  2CC  Final   Culture NO GROWTH 6 DAYS  Final   Report Status 04/30/2015 FINAL  Final  MRSA PCR Screening     Status: None   Collection Time: 04/28/2015 12:28 AM  Result Value Ref Range Status   MRSA by PCR NEGATIVE NEGATIVE Final    Comment:        The GeneXpert MRSA Assay (FDA approved for NASAL specimens only), is one component of a comprehensive MRSA colonization surveillance program. It is not intended to diagnose MRSA infection nor to guide or monitor treatment for MRSA infections.   Culture, blood (routine x 2)     Status: None (Preliminary result)   Collection Time: 05/04/2015  1:13 AM  Result Value Ref Range Status   Specimen Description BLOOD RIGHT HAND  Final   Special Requests BOTTLES DRAWN AEROBIC AND ANAEROBIC 3CC  Final   Culture NO GROWTH 1 DAY  Final   Report Status PENDING  Incomplete  Culture, blood (routine x 2)     Status: None (Preliminary result)   Collection Time: 05/01/2015  1:13 AM  Result Value Ref Range Status   Specimen Description BLOOD RIGHT ASSIST CONTROL  Final   Special Requests BOTTLES DRAWN AEROBIC AND ANAEROBIC 4CC  Final   Culture NO GROWTH 1 DAY  Final   Report Status PENDING  Incomplete  Culture, expectorated sputum-assessment     Status: None   Collection Time: 05/20/15  2:00 PM  Result Value Ref Range Status   Specimen  Description EXPECTORATED SPUTUM  Final   Special Requests NONE  Final   Sputum evaluation THIS SPECIMEN IS ACCEPTABLE FOR SPUTUM CULTURE  Final   Report Status 05/20/2015 FINAL  Final  Culture, respiratory (NON-Expectorated)     Status: None   Collection Time: 05/20/15  2:00 PM  Result Value Ref Range Status   Specimen Description EXPECTORATED SPUTUM  Final   Special Requests NONE Reflexed from [id number]  Final   Gram Stain   Final    FEW WBC SEEN FEW GRAM POSITIVE RODS FAIR SPECIMEN - 70-80% WBCS    Culture APPEARS TO BE NORMAL FLORA  Final   Report Status 05/22/2015 FINAL  Final  Culture,  bal-quantitative     Status: None   Collection Time: 05/20/15  5:05 PM  Result Value Ref Range Status   Specimen Description BRONCHIAL ALVEOLAR LAVAGE  Final   Special Requests Normal  Final   Gram Stain   Final    MODERATE WBC SEEN RARE GRAM POSITIVE RODS RARE GRAM POSITIVE COCCI IN CHAINS GOOD SPECIMEN - 80-90% WBCS    Culture APPEARS TO BE NORMAL FLORA  Final   Report Status 05/22/2015 FINAL  Final    Medical History: Past Medical History  Diagnosis Date  . Anxiety   . Arthritis   . COPD (chronic obstructive pulmonary disease) ([place])   . CHF (congestive heart failure) ([place])   . Hyperlipidemia   . Hypertension   . Diabetes mellitus without complication ([place])   . Chronic kidney disease   . Neuromuscular disorder ([facility])     Medications:  Scheduled:  . antiseptic oral rinse  7 mL Mouth Rinse q12n4p  . antiseptic oral rinse  7 mL Mouth Rinse QID  . azithromycin  500 mg Intravenous Q24H  . budesonide (PULMICORT) nebulizer solution  0.5 mg Nebulization BID  . chlorhexidine  15 mL Mouth Rinse BID  . chlorhexidine gluconate  15 mL Mouth Rinse BID  . colchicine  0.6 mg Oral Daily  . free water  200 mL Per Tube 3 times per day  . insulin aspart  0-5 Units Subcutaneous QHS  . insulin aspart  0-9 Units Subcutaneous TID WC  . ipratropium-albuterol  3 mL Nebulization Q6H  . linagliptin  5 mg Oral Daily  . losartan  100 mg Oral Daily  . methylPREDNISolone (SOLU-MEDROL) injection  60 mg Intravenous 3 times per day  . metoprolol  100 mg Oral BID  . midazolam  2 mg Intravenous Once  . pantoprazole (PROTONIX) IV  40 mg Intravenous Q12H  . piperacillin-tazobactam (ZOSYN)  IV  3.375 g Intravenous 3 times per day  . potassium chloride  10 mEq Intravenous Q1 Hr x 4  . pravastatin  10 mg Oral Daily  . pregabalin  50 mg Oral TID  . senna-docusate  1 tablet Oral BID  . vancomycin  1,000 mg Intravenous Q24H   Infusions:  . sodium chloride 75 mL/hr at 05/22/15 0824  . dexmedetomidine 0.4  mcg/kg/hr (05/22/15 1307)  . feeding supplement (VITAL HIGH PROTEIN) 1,000 mL (05/22/15 0600)  . propofol (DIPRIVAN) infusion 20 mcg/kg/min (05/22/15 6195)   Assessment: 69 y/o F with acute respiratory failure due to COPD and CHF exacerbation and possible PNA.   Goal of Therapy:  Vancomycin trough level 15-20 mcg/ml  Plan:  Although SCr continues to improve, will continue vancomycin dosing of 1000 mg iv q 24 hours with cultures negative and patient at risk for accumulation. Trough scheduled with the 4th dose representing steady  state. Will continue Zosyn 3.375 g EI q 8 hours. Will f/u renal function and culture results.   Ulice Dash D 05/22/2015,1:33 PM

## 2015-05-22 NOTE — Progress Notes (Signed)
[facility]  ROUNDING NOTE   Subjective:  Patient is still on the ventilator. However her renal function appears to be improving as creatinine is down to 1.59. Patient is awake and will follow simple commands this a.m.   Objective:  Vital signs in last 24 hours:  Temp:  [98 F (36.7 C)-98.8 F (37.1 C)] 98.8 F (37.1 C) (10/27 0400) Pulse Rate:  [73-102] 102 (10/27 0800) Resp:  [11-23] 23 (10/27 0800) BP: (126-169)/(65-89) 169/89 mmHg (10/27 0800) SpO2:  [92 %-97 %] 93 % (10/27 0800) FiO2 (%):  [40 %-65 %] 40 % (10/27 0744)  Weight change:  Filed Weights   05/01/2015 0029 05/11/2015 0900 05/21/15 0520  Weight: 108.7 kg (239 lb 10.2 oz) 108 kg (238 lb 1.6 oz) 105.6 kg (232 lb 12.9 oz)    Intake/Output: I/O last 3 completed shifts: In: 2582.9 [I.V.:1035.4; NG/GT:1247.5; IV Piggyback:300] Out: 1800 [Urine:1800]   Intake/Output this shift:  Total I/O In: -  Out: 675 [Urine:275; Emesis/NG output:400]  Physical Exam: General: Critically ill appearing  Head: Normocephalic, atraumatic. Eyes closed, ETT in place  Eyes: Eyes closed  Neck: Supple, trachea midline  Lungs:  Bilateral rhonchi, vent assisted, fio2 100%  Heart: S1S2 no rubs  Abdomen:  Soft, nontender, BS present   Extremities:  trace peripheral edema.  Neurologic: Intubated, but awake and following commands with b/l UEs  Skin: No lesions  GU: Foley in place    Basic Metabolic Panel:  Recent Labs Lab 04/28/2015 0036 05/20/15 0500 05/20/15 1925 05/21/15 0450 05/22/15 0407  NA 139 139 138 139 138  K 4.7 4.1 3.7 4.0 3.4*  CL 94* 99* 99* 100* 102  CO2 36* '30 29 30 29  '$ GLUCOSE 211* 188* 178* 166* 188*  BUN 27* 39* 43* 45* 49*  CREATININE 1.82* 2.14* 2.10* 1.91* 1.59*  CALCIUM 8.9 8.4* 8.2* 8.4* 7.9*    Liver Function Tests:  Recent Labs Lab 05/07/2015 0036  AST 24  ALT 13*  ALKPHOS 79  BILITOT 0.8  PROT 7.1  ALBUMIN 3.4*   No results for input(s): LIPASE, AMYLASE in the last 168  hours. No results for input(s): AMMONIA in the last 168 hours.  CBC:  Recent Labs Lab 05/08/2015 0036 05/20/15 0500 05/21/15 0450 05/22/15 0407  WBC 6.7 4.7 5.6 4.5  HGB 15.5 12.9 12.5 12.4  HCT 47.6* 40.0 37.7 37.7  MCV 92.7 92.7 92.2 91.5  PLT 108* 91* 84* 72*    Cardiac Enzymes:  Recent Labs Lab 04/30/2015 0036  TROPONINI <0.03    BNP: Invalid input(s): POCBNP  CBG:  Recent Labs Lab 05/21/15 1702 05/21/15 2238 05/22/15 0008 05/22/15 0548 05/22/15 0853  GLUCAP 152* 193* 182* 207* 56*    Microbiology: Results for orders placed or performed during the hospital encounter of 05/03/2015  MRSA PCR Screening     Status: None   Collection Time: 05/11/2015 12:28 AM  Result Value Ref Range Status   MRSA by PCR NEGATIVE NEGATIVE Final    Comment:        The GeneXpert MRSA Assay (FDA approved for NASAL specimens only), is one component of a comprehensive MRSA colonization surveillance program. It is not intended to diagnose MRSA infection nor to guide or monitor treatment for MRSA infections.   Culture, blood (routine x 2)     Status: None (Preliminary result)   Collection Time: 05/16/2015  1:13 AM  Result Value Ref Range Status   Specimen Description BLOOD RIGHT HAND  Final   Special Requests  BOTTLES DRAWN AEROBIC AND ANAEROBIC 3CC  Final   Culture NO GROWTH 1 DAY  Final   Report Status PENDING  Incomplete  Culture, blood (routine x 2)     Status: None (Preliminary result)   Collection Time: 05/08/2015  1:13 AM  Result Value Ref Range Status   Specimen Description BLOOD RIGHT ASSIST CONTROL  Final   Special Requests BOTTLES DRAWN AEROBIC AND ANAEROBIC 4CC  Final   Culture NO GROWTH 1 DAY  Final   Report Status PENDING  Incomplete  Culture, expectorated sputum-assessment     Status: None   Collection Time: 05/20/15  2:00 PM  Result Value Ref Range Status   Specimen Description EXPECTORATED SPUTUM  Final   Special Requests NONE  Final   Sputum evaluation THIS  SPECIMEN IS ACCEPTABLE FOR SPUTUM CULTURE  Final   Report Status 05/20/2015 FINAL  Final  Culture, respiratory (NON-Expectorated)     Status: None (Preliminary result)   Collection Time: 05/20/15  2:00 PM  Result Value Ref Range Status   Specimen Description EXPECTORATED SPUTUM  Final   Special Requests NONE Reflexed from [id number]  Final   Gram Stain PENDING  Incomplete   Culture APPEARS TO BE NORMAL FLORA  Final   Report Status PENDING  Incomplete  Culture, bal-quantitative     Status: None (Preliminary result)   Collection Time: 05/20/15  5:05 PM  Result Value Ref Range Status   Specimen Description BRONCHIAL ALVEOLAR LAVAGE  Final   Special Requests Normal  Final   Gram Stain PENDING  Incomplete   Culture NO GROWTH < 24 HOURS  Final   Report Status PENDING  Incomplete    Coagulation Studies: No results for input(s): LABPROT, INR in the last 72 hours.  Urinalysis: No results for input(s): COLORURINE, LABSPEC, PHURINE, GLUCOSEU, HGBUR, BILIRUBINUR, KETONESUR, PROTEINUR, UROBILINOGEN, NITRITE, LEUKOCYTESUR in the last 72 hours.  Invalid input(s): APPERANCEUR    Imaging: Ct Chest Wo Contrast  05/20/2015  CLINICAL DATA:  69 year old who presented with acute hypercapnic respiratory failure, current history of COPD and CHF, stage 3 chronic kidney disease, with an enlarging right pleural effusion and right paratracheal soft tissue on chest x-ray yesterday. EXAM: CT CHEST WITHOUT CONTRAST TECHNIQUE: Multidetector CT imaging of the chest was performed following the standard protocol without IV contrast. Intravenous contrast was not administered due to the patient's chronic kidney disease. COMPARISON:  No prior CT.  Chest x-rays 05/05/2015 and earlier. FINDINGS: Best seen on coronal reformatted images is abrupt occlusion of the right upper lobe bronchus and the right bronchus intermedius centrally. As a result, there is dense consolidation in the entire right lung with a areas of hypoattenuation  giving a "drowned lung" appearance. Without intravenous contrast, it is difficult to visualize the large obstructing mass as it has similar attenuation to the consolidated lung. There is an associated small to moderate sized right pleural effusion. No pulmonary parenchymal nodules or masses involving the left lung. Linear scarring involving the medial left lower lobe. No confluent airspace consolidation in the left lung. No left pleural effusion. Apparent pleural thickening throughout the left hemithorax is related to abundant subpleural fat. Bulky right paratracheal conglomerate lymphadenopathy in station 2R and station 4R measuring approximately 5.6 x 5.0 x 7.4 cm. Enlarged lymph nodes elsewhere in the right side of the mediastinum. Enlarged right retroclavicular lymph nodes, the largest measuring approximately 2.8 x 3.8 x 3.1 cm. The bulky mediastinal lymphadenopathy compresses the superior vena cava, and there is a prominent right internal mammary  vein collateral. Heart size upper normal. No pericardial effusion. Mild LAD and right coronary artery atherosclerosis. Moderate atherosclerosis involving the thoracic and upper abdominal aorta and their visualized branches. Approximate 3 cm cyst involving the posterior segment right lobe of liver. Within the limits of the unenhanced technique, no solid mass involving visualized liver. Approximate 1.6 x 2.6 x 2.2 cm low-attenuation mass involving the left adrenal gland. Solitary punctate calcification involving the proximal body of the pancreas. Visualized upper abdomen otherwise unremarkable for the unenhanced technique. IMPRESSION: 1. Obstructing central mass involving the right lung with occlusion of the right upper lobe bronchus and the bronchus intermedius with complete atelectasis of the right lung. The mass is difficult to visualize as it is of similar attenuation to the atelectatic lung. Bronchoscopy is recommended in further evaluation and for diagnosis. 2.  Small to moderate-sized right pleural effusion. 3. Metastatic mediastinal lymphadenopathy as detailed above, the largest conglomerate nodal mass in the right paratracheal region. 4. Mild compression of the superior vena cava with a prominent right internal mammary vein collateral. 5. Approximate 3 cm cyst involving the posterior segment right lobe of the visualized liver. 6. Approximate 2.6 cm low-attenuation mass involving the left adrenal gland, likely adenoma. Electronically Signed   By: Evangeline Dakin M.D.   On: 05/20/2015 12:41   US Renal  05/21/2015  CLINICAL DATA:  Acute renal failure EXAM: RENAL / URINARY TRACT ULTRASOUND COMPLETE COMPARISON:  None. FINDINGS: Right Kidney: Length: 11 cm. Echogenicity within normal limits. No mass or hydronephrosis visualized. There is cortical thinning probable due to atrophy Left Kidney: Length: 12.2 cm. Cortical thinning is noted probable due to atrophy. Echogenicity within normal limits. No mass or hydronephrosis visualized. Bladder: The urinary bladder is decompressed with Foley catheter. IMPRESSION: 1. No hydronephrosis. No renal calculi. Bilateral cortical thinning probable due to atrophy. Decompressed urinary bladder with Foley catheter. Electronically Signed   By: Lahoma Crocker M.D.   On: 05/21/2015 09:25   Dg Chest Port 1 View  05/21/2015  CLINICAL DATA:  Respiratory failure, intubated EXAM: PORTABLE CHEST 1 VIEW COMPARISON:  05/20/2015 FINDINGS: Cardiomegaly again noted. Stable endotracheal and NG tube position. Again noted almost complete opacification of the right hemi thorax. Chronic elevation of the right hemidiaphragm. Mild left basilar atelectasis. No convincing pulmonary edema. No pneumothorax. Atherosclerotic calcifications of thoracic aorta again noted. IMPRESSION: Stable endotracheal and NG tube position. Again noted almost complete opacification of the right hemi thorax. Chronic elevation of the right hemidiaphragm. Mild left basilar atelectasis.  No convincing pulmonary edema. No pneumothorax. Atherosclerotic calcifications of thoracic aorta again noted. Electronically Signed   By: Lahoma Crocker M.D.   On: 05/21/2015 08:22   Dg Chest Port 1 View  05/20/2015  CLINICAL DATA:  Acute respiratory failure with hypoxia. Status post bronchoscopy and line placement. EXAM: PORTABLE CHEST 1 VIEW COMPARISON:  05/20/2015 at 1302 hours FINDINGS: Following bronchoscopy, there is now partial aeration of the right lung noted in the right upper to mid lung. There is also some aeration now noted in the right lower lung with a band of opacity noted across the mid lung bordering the minor fissure. Left lung is hyperexpanded but essentially clear. No pneumothorax. Endotracheal tube is stable with its tip 4.7 cm above the carina. Orogastric tube passes below the diaphragm. IMPRESSION: 1. Improved right lung aeration following bronchoscopy. No pneumothorax. 2. No other change. Electronically Signed   By: Lajean Manes M.D.   On: 05/20/2015 17:42   Dg Chest [facility]  05/20/2015  CLINICAL DATA:  Hypoxia EXAM: PORTABLE CHEST 1 VIEW COMPARISON:  Chest CT obtained earlier in the day; chest radiograph May 19, 2015 FINDINGS: Endotracheal tube tip is 4.6 cm above the carina. Nasogastric tube tip and side port are below the diaphragm. No pneumothorax. There is now complete opacification of the right hemi thorax, felt to be due to a combination of effusion and consolidation. Left lung is clear. Heart size is within normal limits. There is atherosclerotic change in the aorta. Mass lesions seen on CT are obscured on the right by the diffuse consolidation and effusion. IMPRESSION: Tube positions as described without pneumothorax. Left lung clear. Complete opacification on the right. Cardiac silhouette within normal limits. Atherosclerotic calcification noted. Electronically Signed   By: Lowella Grip III M.D.   On: 05/20/2015 13:18   Dg Abd Portable 1v  05/20/2015  CLINICAL  DATA:  OG an indentation. EXAM: PORTABLE ABDOMEN - 1 VIEW COMPARISON:  None. FINDINGS: Enteric tube tip is adequately positioned in the body of the stomach. Mildly prominent gas-filled loops of small bowel are noted within the right lower quadrant. Overall bowel gas pattern is indeterminate. No evidence of free intraperitoneal air seen. Large dense opacity noted at the right lung base. IMPRESSION: 1. OG tube appears adequately positioned with tip in the region of the stomach body. 2. Large dense opacity at the right lung base, incompletely imaged, which could be consolidation or effusion. Electronically Signed   By: Franki Cabot M.D.   On: 05/20/2015 13:19     Medications:   . sodium chloride 75 mL/hr at 05/22/15 0824  . feeding supplement (VITAL HIGH PROTEIN) 1,000 mL (05/22/15 0600)  . propofol (DIPRIVAN) infusion 20 mcg/kg/min (05/22/15 0837)   . antiseptic oral rinse  7 mL Mouth Rinse q12n4p  . antiseptic oral rinse  7 mL Mouth Rinse QID  . azithromycin  500 mg Intravenous Q24H  . budesonide (PULMICORT) nebulizer solution  0.5 mg Nebulization BID  . chlorhexidine  15 mL Mouth Rinse BID  . chlorhexidine gluconate  15 mL Mouth Rinse BID  . colchicine  0.6 mg Oral Daily  . free water  200 mL Per Tube 3 times per day  . insulin aspart  0-5 Units Subcutaneous QHS  . insulin aspart  0-9 Units Subcutaneous TID WC  . ipratropium-albuterol  3 mL Nebulization Q6H  . linagliptin  5 mg Oral Daily  . losartan  100 mg Oral Daily  . methylPREDNISolone (SOLU-MEDROL) injection  60 mg Intravenous 3 times per day  . metoprolol  100 mg Oral BID  . midazolam  2 mg Intravenous Once  . pantoprazole (PROTONIX) IV  40 mg Intravenous Q12H  . piperacillin-tazobactam (ZOSYN)  IV  3.375 g Intravenous 3 times per day  . pravastatin  10 mg Oral Daily  . pregabalin  50 mg Oral TID  . senna-docusate  1 tablet Oral BID  . vancomycin  1,000 mg Intravenous Q24H   acetaminophen **OR** acetaminophen,  ipratropium-albuterol, midazolam, midazolam, morphine injection, ondansetron **OR** ondansetron (ZOFRAN) IV  Assessment/ Plan:  69 y.o. female with a PMHx of anxiety, osteoarthritis, COPD, congestive heart failure, hyperlipidemia, hypertension, diabetes mellitus, chronic and disease stage III Baseline creatinine 1.1, who was admitted to [facility] on 05/05/2015 for evaluation of shortness of breath.   1. Acute renal failure due to ATN. 2. CKD stage III baseline Cr 1.1 3. Hypotension, likely due to sedation. 4. Acute respiratory failure. 5. New right sided lung mass s/p bronchoscopy 05/20/15.  Plan:  Overall the patient continues to be critically ill. Creatinine down to 1.59 this a.m. Patient noted to be hypokalemic and we will provide repletion for this. Continue IVF hdyration for now. She remains on the ventilator at this point in time and FiO2 is currently 100% which is higher than yesterday. Pulmonary critical care following closely. Weaning from the ventilator per their protocol.   LOS: 3 Samona Chihuahua 10/27/20169:07 AM

## 2015-05-22 NOTE — Progress Notes (Signed)
[facility] Cardiology  SUBJECTIVE: Still intubated   Filed Vitals:   05/22/15 0300 05/22/15 0400 05/22/15 0500 05/22/15 0600  BP: 133/66 144/82 135/68 143/70  Pulse: 84 92 86 80  Temp:  98.8 F (37.1 C)    TempSrc:  Oral    Resp: '19 19 20 20  '$ Height:      Weight:      SpO2: 94% 95% 93% 94%     Intake/Output Summary (Last 24 hours) at 05/22/15 4174 Last data filed at 05/22/15 0814  Gross per 24 hour  Intake 1547.5 ml  Output   1350 ml  Net  197.5 ml      PHYSICAL EXAM  General: Patient intubated HEENT:  Normocephalic and atramatic Neck:  No JVD.  Lungs: Clear bilaterally to auscultation and percussion. Heart: HRRR . Normal S1 and S2 without gallops or murmurs.  Abdomen: Bowel sounds are positive, abdomen soft and non-tender  Msk:  Back normal, normal gait. Normal strength and tone for age. Extremities: No clubbing, cyanosis or edema.   Neuro: Alert and oriented X 3. Psych:  Good affect, responds appropriately   LABS: Basic Metabolic Panel:  Recent Labs  05/21/15 0450 05/22/15 0407  NA 139 138  K 4.0 3.4*  CL 100* 102  CO2 30 29  GLUCOSE 166* 188*  BUN 45* 49*  CREATININE 1.91* 1.59*  CALCIUM 8.4* 7.9*   Liver Function Tests: No results for input(s): AST, ALT, ALKPHOS, BILITOT, PROT, ALBUMIN in the last 72 hours. No results for input(s): LIPASE, AMYLASE in the last 72 hours. CBC:  Recent Labs  05/21/15 0450 05/22/15 0407  WBC 5.6 4.5  HGB 12.5 12.4  HCT 37.7 37.7  MCV 92.2 91.5  PLT 84* 72*   Cardiac Enzymes: No results for input(s): CKTOTAL, CKMB, CKMBINDEX, TROPONINI in the last 72 hours. BNP: Invalid input(s): POCBNP D-Dimer: No results for input(s): DDIMER in the last 72 hours. Hemoglobin A1C: No results for input(s): HGBA1C in the last 72 hours. Fasting Lipid Panel:  Recent Labs  05/20/15 0500  TRIG 169*   Thyroid Function Tests: No results for input(s): TSH, T4TOTAL, T3FREE, THYROIDAB in the last 72 hours.  Invalid input(s):  FREET3 Anemia Panel: No results for input(s): VITAMINB12, FOLATE, FERRITIN, TIBC, IRON, RETICCTPCT in the last 72 hours.  Ct Chest Wo Contrast  05/20/2015  CLINICAL DATA:  69 year old who presented with acute hypercapnic respiratory failure, current history of COPD and CHF, stage 3 chronic kidney disease, with an enlarging right pleural effusion and right paratracheal soft tissue on chest x-ray yesterday. EXAM: CT CHEST WITHOUT CONTRAST TECHNIQUE: Multidetector CT imaging of the chest was performed following the standard protocol without IV contrast. Intravenous contrast was not administered due to the patient's chronic kidney disease. COMPARISON:  No prior CT.  Chest x-rays 05/22/2015 and earlier. FINDINGS: Best seen on coronal reformatted images is abrupt occlusion of the right upper lobe bronchus and the right bronchus intermedius centrally. As a result, there is dense consolidation in the entire right lung with a areas of hypoattenuation giving a "drowned lung" appearance. Without intravenous contrast, it is difficult to visualize the large obstructing mass as it has similar attenuation to the consolidated lung. There is an associated small to moderate sized right pleural effusion. No pulmonary parenchymal nodules or masses involving the left lung. Linear scarring involving the medial left lower lobe. No confluent airspace consolidation in the left lung. No left pleural effusion. Apparent pleural thickening throughout the left hemithorax is related to abundant subpleural  fat. Bulky right paratracheal conglomerate lymphadenopathy in station 2R and station 4R measuring approximately 5.6 x 5.0 x 7.4 cm. Enlarged lymph nodes elsewhere in the right side of the mediastinum. Enlarged right retroclavicular lymph nodes, the largest measuring approximately 2.8 x 3.8 x 3.1 cm. The bulky mediastinal lymphadenopathy compresses the superior vena cava, and there is a prominent right internal mammary vein collateral.  Heart size upper normal. No pericardial effusion. Mild LAD and right coronary artery atherosclerosis. Moderate atherosclerosis involving the thoracic and upper abdominal aorta and their visualized branches. Approximate 3 cm cyst involving the posterior segment right lobe of liver. Within the limits of the unenhanced technique, no solid mass involving visualized liver. Approximate 1.6 x 2.6 x 2.2 cm low-attenuation mass involving the left adrenal gland. Solitary punctate calcification involving the proximal body of the pancreas. Visualized upper abdomen otherwise unremarkable for the unenhanced technique. IMPRESSION: 1. Obstructing central mass involving the right lung with occlusion of the right upper lobe bronchus and the bronchus intermedius with complete atelectasis of the right lung. The mass is difficult to visualize as it is of similar attenuation to the atelectatic lung. Bronchoscopy is recommended in further evaluation and for diagnosis. 2. Small to moderate-sized right pleural effusion. 3. Metastatic mediastinal lymphadenopathy as detailed above, the largest conglomerate nodal mass in the right paratracheal region. 4. Mild compression of the superior vena cava with a prominent right internal mammary vein collateral. 5. Approximate 3 cm cyst involving the posterior segment right lobe of the visualized liver. 6. Approximate 2.6 cm low-attenuation mass involving the left adrenal gland, likely adenoma. Electronically Signed   By: Evangeline Dakin M.D.   On: 05/20/2015 12:41   US Renal  05/21/2015  CLINICAL DATA:  Acute renal failure EXAM: RENAL / URINARY TRACT ULTRASOUND COMPLETE COMPARISON:  None. FINDINGS: Right Kidney: Length: 11 cm. Echogenicity within normal limits. No mass or hydronephrosis visualized. There is cortical thinning probable due to atrophy Left Kidney: Length: 12.2 cm. Cortical thinning is noted probable due to atrophy. Echogenicity within normal limits. No mass or hydronephrosis  visualized. Bladder: The urinary bladder is decompressed with Foley catheter. IMPRESSION: 1. No hydronephrosis. No renal calculi. Bilateral cortical thinning probable due to atrophy. Decompressed urinary bladder with Foley catheter. Electronically Signed   By: Lahoma Crocker M.D.   On: 05/21/2015 09:25   Dg Chest Port 1 View  05/21/2015  CLINICAL DATA:  Respiratory failure, intubated EXAM: PORTABLE CHEST 1 VIEW COMPARISON:  05/20/2015 FINDINGS: Cardiomegaly again noted. Stable endotracheal and NG tube position. Again noted almost complete opacification of the right hemi thorax. Chronic elevation of the right hemidiaphragm. Mild left basilar atelectasis. No convincing pulmonary edema. No pneumothorax. Atherosclerotic calcifications of thoracic aorta again noted. IMPRESSION: Stable endotracheal and NG tube position. Again noted almost complete opacification of the right hemi thorax. Chronic elevation of the right hemidiaphragm. Mild left basilar atelectasis. No convincing pulmonary edema. No pneumothorax. Atherosclerotic calcifications of thoracic aorta again noted. Electronically Signed   By: Lahoma Crocker M.D.   On: 05/21/2015 08:22   Dg Chest Port 1 View  05/20/2015  CLINICAL DATA:  Acute respiratory failure with hypoxia. Status post bronchoscopy and line placement. EXAM: PORTABLE CHEST 1 VIEW COMPARISON:  05/20/2015 at 1302 hours FINDINGS: Following bronchoscopy, there is now partial aeration of the right lung noted in the right upper to mid lung. There is also some aeration now noted in the right lower lung with a band of opacity noted across the mid lung bordering the minor fissure.  Left lung is hyperexpanded but essentially clear. No pneumothorax. Endotracheal tube is stable with its tip 4.7 cm above the carina. Orogastric tube passes below the diaphragm. IMPRESSION: 1. Improved right lung aeration following bronchoscopy. No pneumothorax. 2. No other change. Electronically Signed   By: Lajean Manes M.D.   On:  05/20/2015 17:42   Dg Chest Port 1 View  05/20/2015  CLINICAL DATA:  Hypoxia EXAM: PORTABLE CHEST 1 VIEW COMPARISON:  Chest CT obtained earlier in the day; chest radiograph May 19, 2015 FINDINGS: Endotracheal tube tip is 4.6 cm above the carina. Nasogastric tube tip and side port are below the diaphragm. No pneumothorax. There is now complete opacification of the right hemi thorax, felt to be due to a combination of effusion and consolidation. Left lung is clear. Heart size is within normal limits. There is atherosclerotic change in the aorta. Mass lesions seen on CT are obscured on the right by the diffuse consolidation and effusion. IMPRESSION: Tube positions as described without pneumothorax. Left lung clear. Complete opacification on the right. Cardiac silhouette within normal limits. Atherosclerotic calcification noted. Electronically Signed   By: Lowella Grip III M.D.   On: 05/20/2015 13:18   Dg Abd Portable 1v  05/20/2015  CLINICAL DATA:  OG an indentation. EXAM: PORTABLE ABDOMEN - 1 VIEW COMPARISON:  None. FINDINGS: Enteric tube tip is adequately positioned in the body of the stomach. Mildly prominent gas-filled loops of small bowel are noted within the right lower quadrant. Overall bowel gas pattern is indeterminate. No evidence of free intraperitoneal air seen. Large dense opacity noted at the right lung base. IMPRESSION: 1. OG tube appears adequately positioned with tip in the region of the stomach body. 2. Large dense opacity at the right lung base, incompletely imaged, which could be consolidation or effusion. Electronically Signed   By: Franki Cabot M.D.   On: 05/20/2015 13:19     Echo pending  TELEMETRY: Sinus tachycardia:  ASSESSMENT AND PLAN:  Active Problems:   Hypercapnic respiratory failure (HCC)   Acute respiratory failure with hypercapnia (HCC)   COPD with acute exacerbation (HCC)   Acute respiratory failure with hypoxia (HCC)   Lung mass    1. Atrial  arrhythmias, SVT versus AF , peri and post bronchoscopy, early in sinus rhythm and sinus tachycardia  Recommendations  1. Continue metoprolol 100 mg twice a day 2. Defer antiarrhythmic therapy 3. Review 2-D echocardiogram 4. Defer chronic anticoagulation  Sign off for now, please call if any questions    Yareni Creps, MD, PhD, [facility] 05/22/2015 8:22 AM

## 2015-05-22 NOTE — Progress Notes (Signed)
[place] [facility] [place] NAME: Emily Pratt    MR#:  [id number]  DATE OF BIRTH:  April 02, 1946  SUBJECTIVE:  CHIEF COMPLAINT:  Intubated but alert, joking, responding appropriately.  REVIEW OF SYSTEMS:   Unable to obtain  DRUG ALLERGIES:   Allergies  Allergen Reactions  . Atorvastatin Other (See Comments)    Does not remember why she had Intolerance to Lipitor.  . Fentanyl Itching  . Rosuvastatin Anxiety    Chest tigtness and feeling as if she had heartburn.    VITALS:  Blood pressure 133/67, pulse 82, temperature 98.7 F (37.1 C), temperature source Oral, resp. rate 18, height '5\' 7"'$  (1.702 m), weight 105.6 kg (232 lb 12.9 oz), SpO2 100 %.  PHYSICAL EXAMINATION:  GENERAL:  69 y.o.-year-old patient lying in the bed, intubated but alert EYES: Pupils equal, round, reactive to light and accommodation. No scleral icterus.  HEENT: Head atraumatic, normocephalic. Oropharynx and nasopharynx clear. ETT in place NECK:  Supple, no jugular venous distention. No thyroid enlargement, no tenderness.  LUNGS: Scattered wheezes and rhonchi, coarse breath sounds, fair air movement  CARDIOVASCULAR: S1, S2 normal. No murmurs, rubs, or gallops.  ABDOMEN: Soft, nontender, nondistended. Bowel sounds present. No organomegaly or mass.  EXTREMITIES: +1 pedal edema, cyanosis, or clubbing.  NEUROLOGIC: CN 2-12 intact, strength 5/5, follows commands, answers questions PSYCHIATRIC: alert, seems oriented, laughing and joking SKIN: Sacral decubitus ulcer   LABORATORY PANEL:   CBC  Recent Labs Lab 05/22/15 0407  WBC 4.5  HGB 12.4  HCT 37.7  PLT 72*   ------------------------------------------------------------------------------------------------------------------  Chemistries   Recent Labs Lab 05/22/2015 0036  05/22/15 0407  NA 139  < > 138  K 4.7  < > 3.4*  CL 94*  < > 102  CO2 36*  < > 29  GLUCOSE 211*  < > 188*  BUN 27*  < > 49*   CREATININE 1.82*  < > 1.59*  CALCIUM 8.9  < > 7.9*  AST 24  --   --   ALT 13*  --   --   ALKPHOS 79  --   --   BILITOT 0.8  --   --   < > = values in this interval not displayed. ------------------------------------------------------------------------------------------------------------------  Cardiac Enzymes  Recent Labs Lab 05/06/2015 0036  TROPONINI <0.03   ------------------------------------------------------------------------------------------------------------------  RADIOLOGY:  US Renal  05/21/2015  CLINICAL DATA:  Acute renal failure EXAM: RENAL / URINARY TRACT ULTRASOUND COMPLETE COMPARISON:  None. FINDINGS: Right Kidney: Length: 11 cm. Echogenicity within normal limits. No mass or hydronephrosis visualized. There is cortical thinning probable due to atrophy Left Kidney: Length: 12.2 cm. Cortical thinning is noted probable due to atrophy. Echogenicity within normal limits. No mass or hydronephrosis visualized. Bladder: The urinary bladder is decompressed with Foley catheter. IMPRESSION: 1. No hydronephrosis. No renal calculi. Bilateral cortical thinning probable due to atrophy. Decompressed urinary bladder with Foley catheter. Electronically Signed   By: Lahoma Crocker M.D.   On: 05/21/2015 09:25   Dg Chest Port 1 View  05/21/2015  CLINICAL DATA:  Respiratory failure, intubated EXAM: PORTABLE CHEST 1 VIEW COMPARISON:  05/20/2015 FINDINGS: Cardiomegaly again noted. Stable endotracheal and NG tube position. Again noted almost complete opacification of the right hemi thorax. Chronic elevation of the right hemidiaphragm. Mild left basilar atelectasis. No convincing pulmonary edema. No pneumothorax. Atherosclerotic calcifications of thoracic aorta again noted. IMPRESSION: Stable endotracheal and NG tube position. Again noted almost complete opacification of the right  hemi thorax. Chronic elevation of the right hemidiaphragm. Mild left basilar atelectasis. No convincing pulmonary edema. No  pneumothorax. Atherosclerotic calcifications of thoracic aorta again noted. Electronically Signed   By: Lahoma Crocker M.D.   On: 05/21/2015 08:22   Dg Chest Port 1 View  05/20/2015  CLINICAL DATA:  Acute respiratory failure with hypoxia. Status post bronchoscopy and line placement. EXAM: PORTABLE CHEST 1 VIEW COMPARISON:  05/20/2015 at 1302 hours FINDINGS: Following bronchoscopy, there is now partial aeration of the right lung noted in the right upper to mid lung. There is also some aeration now noted in the right lower lung with a band of opacity noted across the mid lung bordering the minor fissure. Left lung is hyperexpanded but essentially clear. No pneumothorax. Endotracheal tube is stable with its tip 4.7 cm above the carina. Orogastric tube passes below the diaphragm. IMPRESSION: 1. Improved right lung aeration following bronchoscopy. No pneumothorax. 2. No other change. Electronically Signed   By: Lajean Manes M.D.   On: 05/20/2015 17:42    EKG:   Orders placed or performed during the hospital encounter of 04/26/2015  . EKG 12-Lead  . EKG 12-Lead    ASSESSMENT AND PLAN:   1. Acute on chronic respiratory failure with hypoxia and Hypercapnia,  - Multifactorial due to large right-sided pleural effusion/ CHF/ COPD / mass possible malignancy - Pulmonology following - Continue nebulizer, steroids, antibiotics  2. 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 Acute on chronic kidney disease:  - Due to ATN, hypovolemia and hypotension - Continues to improve - Nephrology following  4. Essential hypertension:  - Stable, continue to hold metoprolol and Cozaar  5. Diabetes mellitus type 2: Hold home medications Provide sliding scale insulin  6. Lung mass - Status post bronchoscopy and 10/25, pathology results are pending - Appreciate  oncology consultation,  Have consulted radiation oncology - Long-term history of heavy smoking  7. Postobstructive pneumonia - Blood cultures negative to date, respiratory, BAL, AFB all pending - Continue vancomycin and Zosyn  All the records are reviewed and case discussed with the patient's husband and sister at the bedside.  CODE STATUS: Full code  TOTAL CRITICAL CARE TIME TAKING CARE OF THIS PATIENT: 35 minutes.   POSSIBLE D/C IN ?  DAYS, DEPENDING ON CLINICAL CONDITION.   Myrtis Ser M.D on 05/22/2015 at 1:40 PM  Between 7am to 6pm - Pager - [phone] After 6pm go to [email or web address] - password [email or web address] [place]  Office  [phone]  CC: Primary care physician; Dagoberto Ligas, MD

## 2015-05-22 NOTE — Consult Note (Signed)
[facility] Consultation      Name: Emily Pratt MRN: [id number] DOB: 1946-04-11    ADMISSION DATE:  05/22/2015  CHIEF COMPLAINT:  Lethargic and resp distress   HISTORY OF PRESENT ILLNESS 05/13/2015  69 yo obese white female admitted to ICU for acute resp failure  The patient presents via EMS complaining of shortness of breath. She reportedly had oxygen saturations as low as 70% when EMS arrived. The patient was on BiPAP at that time. FiO2 was increased to 100% and oxygen saturations improved to only 79%. In the emergency department patient was immediately initiated on BiPAP. She had aggressively become more somnolent.  Chest x-ray showed worsening pleural effusion. Patient on biPAP fio2 at 50% now  Subjective: Patient placed on spontaneous breathing trial this morning, with sedation vacation, noted to have tachypnea, O2 sats dropped into the mid 80s, agitation. Sedation vacation stopped and patient was resumed on propofol at 20, and placed back on a rate with FiO2 of 40%. Patient states she is doing well this morning appropriate and event.    SIGNIFICANT EVENTS  CT chest R lung mass, intubated on 10/25, bronch with biopsy, overnight with some tachycardia but spontaneously resolved.    ICU admission 10/24 10/25>>CT Chest with R lung mass >>intubated 10/25>>bronchoscopy with forceps biopsy of RMS and RUL carina (forceps and FNA)  PAST MEDICAL HISTORY    :  Past Medical History  Diagnosis Date  . Anxiety   . Arthritis   . COPD (chronic obstructive pulmonary disease) ([place])   . CHF (congestive heart failure) ([place])   . Hyperlipidemia   . Hypertension   . Diabetes mellitus without complication ([address])   . Chronic kidney disease   . Neuromuscular disorder [facility])    Past Surgical History  Procedure Laterality Date  . Joint replacement Bilateral 2006    both knees  . Ankle surgery     Prior to Admission medications   Medication Sig Start Date End Date  Taking? Authorizing Provider  albuterol (PROVENTIL) (2.5 MG/3ML) 0.083% nebulizer solution Take 3 mLs (2.5 mg total) by nebulization every 6 (six) hours as needed for wheezing or shortness of breath. 05/07/15  Yes Roselee Nova, MD  allopurinol (ZYLOPRIM) 300 MG tablet Take 1 tablet (300 mg total) by mouth daily. 03/06/15  Yes Roselee Nova, MD  budesonide-formoterol [facility]) 160-4.5 MCG/ACT inhaler Inhale 2 puffs into the lungs 2 (two) times daily. 04/24/15  Yes Roselee Nova, MD  colchicine 0.6 MG tablet Take 1 tablet (0.6 mg total) by mouth daily. 03/06/15  Yes Roselee Nova, MD  furosemide (LASIX) 80 MG tablet Take 80 mg by mouth daily.  11/06/14  Yes Historical Provider, MD  JANUVIA 50 MG tablet Take 1 tablet (50 mg total) by mouth daily. 03/06/15  Yes Roselee Nova, MD  losartan (COZAAR) 100 MG tablet Take 1 tablet (100 mg total) by mouth daily. 03/06/15  Yes Roselee Nova, MD  LYRICA 50 MG capsule Take 1 capsule (50 mg total) by mouth 3 (three) times daily. 03/06/15  Yes Roselee Nova, MD  metoprolol (LOPRESSOR) 100 MG tablet Take 1 tablet (100 mg total) by mouth 2 (two) times daily. 03/06/15  Yes Roselee Nova, MD  oxyCODONE-acetaminophen (PERCOCET) 10-325 MG tablet Take 1 tablet by mouth every 6 (six) hours as needed for pain. 05/07/15  Yes Roselee Nova, MD  pravastatin (PRAVACHOL) 10 MG tablet Take 10 mg  by mouth daily.   Yes Historical Provider, MD  tiotropium (SPIRIVA HANDIHALER) 18 MCG inhalation capsule Place 1 capsule (18 mcg total) into inhaler and inhale daily. 05/09/15  Yes Roselee Nova, MD  VENTOLIN HFA 108 (90 BASE) MCG/ACT inhaler Inhale 1 puff into the lungs every 4 (four) hours as needed.  03/25/15  Yes Historical Provider, MD  predniSONE (DELTASONE) 10 MG tablet Take 1 tablet (10 mg total) by mouth daily with breakfast. Patient not taking: Reported on 05/15/2015 04/25/15   Bettey Costa, MD   Allergies  Allergen Reactions  . Atorvastatin Other (See  Comments)    Does not remember why she had Intolerance to Lipitor.  . Fentanyl Itching  . Rosuvastatin Anxiety    Chest tigtness and feeling as if she had heartburn.     FAMILY HISTORY   Family History  Problem Relation Age of Onset  . Diabetes Mother   . Cancer Father   . Diabetes Sister   . Stroke Sister       SOCIAL HISTORY    reports that she has been smoking Cigarettes.  She does not have any smokeless tobacco history on file. She reports that she does not drink alcohol or use illicit drugs.  Review of Systems  Unable to perform ROS: critical illness      VITAL SIGNS    Temp:  [98 F (36.7 C)-98.8 F (37.1 C)] 98.8 F (37.1 C) (10/27 0400) Pulse Rate:  [73-102] 102 (10/27 0800) Resp:  [11-23] 23 (10/27 0800) BP: (126-169)/(65-89) 169/89 mmHg (10/27 0800) SpO2:  [92 %-97 %] 93 % (10/27 0800) FiO2 (%):  [40 %-65 %] 40 % (10/27 0744) HEMODYNAMICS:   VENTILATOR SETTINGS: Vent Mode:  [-] PRVC FiO2 (%):  [40 %-65 %] 40 % Set Rate:  [20 bmp] 20 bmp Vt Set:  [500 mL] 500 mL PEEP:  [7 cmH20] 7 cmH20 INTAKE / OUTPUT:  Intake/Output Summary (Last 24 hours) at 05/22/15 5009 Last data filed at 05/22/15 0800  Gross per 24 hour  Intake 1547.5 ml  Output   2025 ml  Net -477.5 ml       PHYSICAL EXAM   Physical Exam  Constitutional: She appears well-developed and well-nourished.  HENT:  Head: Normocephalic and atraumatic.  Eyes: EOM are normal. Pupils are equal, round, and reactive to light.  Neck: Normal range of motion. Neck supple.  Cardiovascular: Normal rate, regular rhythm and normal heart sounds.   No murmur heard. Pulmonary/Chest:  Intubated. Shallow BS at the bases, dec BS at on the right, mild coarse upper airway sounds.   Abdominal: Soft. Bowel sounds are normal.  Musculoskeletal: Normal range of motion. She exhibits no edema.  Neurological: She displays normal reflexes. Coordination normal.  Lethargic,responds to vocal stimuli   Skin:  Skin is warm.  Psychiatric: She has a normal mood and affect.  Nursing note and vitals reviewed.      LABS    Recent Labs Lab 05/20/15 0500 05/21/15 0450 05/22/15 0407  WBC 4.7 5.6 4.5  HGB 12.9 12.5 12.4  HCT 40.0 37.7 37.7  PLT 91* 84* 72*   Coag's No results for input(s): APTT, INR in the last 168 hours. BMET  Recent Labs Lab 05/20/15 1925 05/21/15 0450 05/22/15 0407  NA 138 139 138  K 3.7 4.0 3.4*  CL 99* 100* 102  CO2 '29 30 29  '$ BUN 43* 45* 49*  CREATININE 2.10* 1.91* 1.59*  GLUCOSE 178* 166* 188*   Electrolytes  Recent Labs  Lab 05/20/15 1925 05/21/15 0450 05/22/15 0407  CALCIUM 8.2* 8.4* 7.9*   Sepsis Markers  Recent Labs Lab 05/11/2015 0113 05/05/2015 0410  LATICACIDVEN 2.3* 1.0   ABG  Recent Labs Lab 05/20/15 0935 05/20/15 1430 05/21/15 0845  PHART 7.45 7.37 7.42  PCO2ART 46 59* 52*  PO2ART 62* 62* 79*   Liver Enzymes  Recent Labs Lab 05/01/2015 0036  AST 24  ALT 13*  ALKPHOS 79  BILITOT 0.8  ALBUMIN 3.4*   Cardiac Enzymes  Recent Labs Lab 05/21/2015 0036  TROPONINI <0.03   Glucose  Recent Labs Lab 05/21/15 1129 05/21/15 1702 05/21/15 2238 05/22/15 0008 05/22/15 0548 05/22/15 0853  GLUCAP 153* 152* 193* 182* 207* 222*     Recent Results (from the past 240 hour(s))  MRSA PCR Screening     Status: None   Collection Time: 05/02/2015 12:28 AM  Result Value Ref Range Status   MRSA by PCR NEGATIVE NEGATIVE Final    Comment:        The GeneXpert MRSA Assay (FDA approved for NASAL specimens only), is one component of a comprehensive MRSA colonization surveillance program. It is not intended to diagnose MRSA infection nor to guide or monitor treatment for MRSA infections.   Culture, blood (routine x 2)     Status: None (Preliminary result)   Collection Time: 05/25/2015  1:13 AM  Result Value Ref Range Status   Specimen Description BLOOD RIGHT HAND  Final   Special Requests BOTTLES DRAWN AEROBIC AND ANAEROBIC 3CC   Final   Culture NO GROWTH 1 DAY  Final   Report Status PENDING  Incomplete  Culture, blood (routine x 2)     Status: None (Preliminary result)   Collection Time: 05/14/2015  1:13 AM  Result Value Ref Range Status   Specimen Description BLOOD RIGHT ASSIST CONTROL  Final   Special Requests BOTTLES DRAWN AEROBIC AND ANAEROBIC 4CC  Final   Culture NO GROWTH 1 DAY  Final   Report Status PENDING  Incomplete  Culture, expectorated sputum-assessment     Status: None   Collection Time: 05/20/15  2:00 PM  Result Value Ref Range Status   Specimen Description EXPECTORATED SPUTUM  Final   Special Requests NONE  Final   Sputum evaluation THIS SPECIMEN IS ACCEPTABLE FOR SPUTUM CULTURE  Final   Report Status 05/20/2015 FINAL  Final  Culture, respiratory (NON-Expectorated)     Status: None (Preliminary result)   Collection Time: 05/20/15  2:00 PM  Result Value Ref Range Status   Specimen Description EXPECTORATED SPUTUM  Final   Special Requests NONE Reflexed from [id number]  Final   Gram Stain PENDING  Incomplete   Culture APPEARS TO BE NORMAL FLORA  Final   Report Status PENDING  Incomplete  Culture, bal-quantitative     Status: None (Preliminary result)   Collection Time: 05/20/15  5:05 PM  Result Value Ref Range Status   Specimen Description BRONCHIAL ALVEOLAR LAVAGE  Final   Special Requests Normal  Final   Gram Stain PENDING  Incomplete   Culture NO GROWTH < 24 HOURS  Final   Report Status PENDING  Incomplete     Current facility-administered medications:  .  0.9 %  sodium chloride infusion, , Intravenous, Continuous, Cherita Hebel, MD, Last Rate: 75 mL/hr at 05/22/15 0824 .  acetaminophen (TYLENOL) tablet 650 mg, 650 mg, Oral, Q6H PRN **OR** acetaminophen (TYLENOL) suppository 650 mg, 650 mg, Rectal, Q6H PRN, Harrie Foreman, MD .  antiseptic oral rinse (CPC /  CETYLPYRIDINIUM CHLORIDE 0.05%) solution 7 mL, 7 mL, Mouth Rinse, q12n4p, Nicholes Mango, MD, 7 mL at 05/22/2015 1720 .  antiseptic oral  rinse solution (CORINZ), 7 mL, Mouth Rinse, QID, Tahir Blank, MD, 7 mL at 05/22/15 0356 .  azithromycin (ZITHROMAX) 500 mg in dextrose 5 % 250 mL IVPB, 500 mg, Intravenous, Q24H, Charlett Nose, RPH, 500 mg at 05/21/15 1731 .  budesonide (PULMICORT) nebulizer solution 0.5 mg, 0.5 mg, Nebulization, BID, Flora Lipps, MD, 0.5 mg at 05/22/15 0743 .  chlorhexidine (PERIDEX) 0.12 % solution 15 mL, 15 mL, Mouth Rinse, BID, Nicholes Mango, MD, 15 mL at 05/21/15 2229 .  chlorhexidine gluconate (PERIDEX) 0.12 % solution 15 mL, 15 mL, Mouth Rinse, BID, Danella Philson, MD, 15 mL at 05/22/15 0835 .  colchicine tablet 0.6 mg, 0.6 mg, Oral, Daily, Harrie Foreman, MD, 0.6 mg at 05/21/15 0920 .  feeding supplement (VITAL HIGH PROTEIN) liquid 1,000 mL, 1,000 mL, Per Tube, Continuous, Samaia Iwata, MD, Last Rate: 50 mL/hr at 05/22/15 0600, 1,000 mL at 05/22/15 0600 .  free water 200 mL, 200 mL, Per Tube, 3 times per day, Vilinda Boehringer, MD, 200 mL at 05/22/15 0545 .  insulin aspart (novoLOG) injection 0-5 Units, 0-5 Units, Subcutaneous, QHS, Nicholes Mango, MD, 0 Units at 05/16/2015 2249 .  insulin aspart (novoLOG) injection 0-9 Units, 0-9 Units, Subcutaneous, TID WC, Nicholes Mango, MD, 3 Units at 05/22/15 0858 .  ipratropium-albuterol (DUONEB) 0.5-2.5 (3) MG/3ML nebulizer solution 3 mL, 3 mL, Nebulization, Q6H, Brayli Klingbeil, MD, 3 mL at 05/22/15 0744 .  linagliptin (TRADJENTA) tablet 5 mg, 5 mg, Oral, Daily, Harrie Foreman, MD, 5 mg at 05/21/15 0920 .  losartan (COZAAR) tablet 100 mg, 100 mg, Oral, Daily, Harrie Foreman, MD, 100 mg at 05/21/15 0920 .  methylPREDNISolone sodium succinate (SOLU-MEDROL) 125 mg/2 mL injection 60 mg, 60 mg, Intravenous, 3 times per day, Nicholes Mango, MD, 60 mg at 05/22/15 0545 .  metoprolol (LOPRESSOR) tablet 100 mg, 100 mg, Oral, BID, Harrie Foreman, MD, 100 mg at 05/21/15 2230 .  midazolam (VERSED) injection 1 mg, 1 mg, Intravenous, Q15 min PRN, Zymire Turnbo, MD .  midazolam  (VERSED) injection 1 mg, 1 mg, Intravenous, Q2H PRN, Vilinda Boehringer, MD, 1 mg at 05/22/15 0823 .  midazolam (VERSED) injection 2 mg, 2 mg, Intravenous, Once, Tammie Yanda, MD, 2 mg at 05/20/15 1615 .  morphine 2 MG/ML injection 2 mg, 2 mg, Intravenous, Q4H PRN, Harrie Foreman, MD, 2 mg at 05/20/15 0325 .  ondansetron (ZOFRAN) tablet 4 mg, 4 mg, Oral, Q6H PRN **OR** ondansetron (ZOFRAN) injection 4 mg, 4 mg, Intravenous, Q6H PRN, Harrie Foreman, MD .  pantoprazole (PROTONIX) injection 40 mg, 40 mg, Intravenous, Q12H, Aldean Jewett, MD, 40 mg at 05/21/15 2235 .  piperacillin-tazobactam (ZOSYN) IVPB 3.375 g, 3.375 g, Intravenous, 3 times per day, Flora Lipps, MD, 3.375 g at 05/22/15 0545 .  potassium chloride 10 mEq in 100 mL IVPB, 10 mEq, Intravenous, Q1 Hr x 4, Munsoor Lateef, MD .  pravastatin (PRAVACHOL) tablet 10 mg, 10 mg, Oral, Daily, Harrie Foreman, MD, 10 mg at 05/21/15 1310 .  pregabalin (LYRICA) capsule 50 mg, 50 mg, Oral, TID, Harrie Foreman, MD, 50 mg at 05/21/15 2228 .  propofol (DIPRIVAN) 1000 MG/100ML infusion, 0-50 mcg/kg/min, Intravenous, Continuous, Artemisa Sladek, MD, Last Rate: 13 mL/hr at 05/22/15 0837, 20 mcg/kg/min at 05/22/15 0837 .  senna-docusate (Senokot-S) tablet 1 tablet, 1 tablet, Oral, BID, Vilinda Boehringer, MD,  1 tablet at 05/21/15 0920 .  vancomycin (VANCOCIN) IVPB 1000 mg/200 mL premix, 1,000 mg, Intravenous, Q24H, Aruna Gouru, MD, 1,000 mg at 05/21/15 2042  IMAGING    No results found.  INDWELLING DEVICES:: R Fem CVL 10/25>>  MICRO DATA: MRSA PCR >>neg BAL 10/25>>  ANTIMICROBIALS:  Zosyn/vancomycin 10/24>>    ASSESSMENT/PLAN   69 yo obese white female admitted to ICU for acute hypercapnic and hypoxic resp failure acute COPD and CHF exacerbation from probable pneumonia  PULMONARY Respiratory Failure - intubated on 10/25 - cont with MV, wean as tolerated, did not tolerate SVT this morning due to desaturations agitation, will place on  pressure support along with changing sedation to Precedex and reattempt weaning. 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    GASTROINTESTINAL - TF feeds  HEMATOLOGIC Follow CBC Thrombocytopenia - critical illness, hold heparin for now HIT Panel 10/26>> DVT Prophylaxis - SCDs, agatroban  INFECTIOUS Empiric abx for pneumonia -vanc/sozyn  ENDOCRINE - ICU hypoglycemic\Hyperglycemia protocol   NEUROLOGIC - Rass 0 - CT head to evaluate for any lesions based on biopsy results from lung biopsy   I have personally obtained a history, examined the patient, evaluated laboratory and independently reviewed  imaging results, formulated the assessment and plan and placed orders.  The Patient requires high complexity decision making for assessment and support, frequent evaluation and titration of therapies, application of advanced monitoring technologies and extensive interpretation of multiple databases. Critical Care Time devoted to patient care services described in this note is 35 minutes.   Overall, patient is critically ill, prognosis is guarded. Patient at high risk for cardiac arrest and death.    Vilinda Boehringer, MD [facility] Pager [phone] (please enter 7-digits) On Call Pager - [phone] (please enter 7-digits)

## 2015-05-22 NOTE — Progress Notes (Signed)
Patient heart rate up to 140's, respiratory rate 40's, O2 sat down to 84%, placed back on PRVC per verbal order from Dr. Stevenson Clinch.

## 2015-05-22 NOTE — Progress Notes (Signed)
Nutrition Follow-up   INTERVENTION:  EN: Continue vital high protein at goal rate of 36m/hr with current diprivan rate to meet nutritional needs   NUTRITION DIAGNOSIS:   Inadequate oral intake related to acute illness as evidenced by NPO status.    GOAL:   Provide needs based on ASPEN/SCCM guidelines    MONITOR:    (Energy Intake, Anthropometrics, Electrolyte/Renal Profile, Digestive System, Electrolyte/Renal Profile)  REASON FOR ASSESSMENT:   Consult Enteral/tube feeding initiation and management  ASSESSMENT:      Pt remains on vent   Current Nutrition: tolerating vital high protein at goal rate of 565mhr with current diprivan rate   Gastrointestinal Profile:no signs of GI intolerance Last BM: 10/26   Scheduled Medications:  . antiseptic oral rinse  7 mL Mouth Rinse q12n4p  . antiseptic oral rinse  7 mL Mouth Rinse QID  . azithromycin  500 mg Intravenous Q24H  . budesonide (PULMICORT) nebulizer solution  0.5 mg Nebulization BID  . chlorhexidine  15 mL Mouth Rinse BID  . chlorhexidine gluconate  15 mL Mouth Rinse BID  . colchicine  0.6 mg Oral Daily  . free water  200 mL Per Tube 3 times per day  . insulin aspart  0-5 Units Subcutaneous QHS  . insulin aspart  0-9 Units Subcutaneous TID WC  . ipratropium-albuterol  3 mL Nebulization Q6H  . linagliptin  5 mg Oral Daily  . losartan  100 mg Oral Daily  . methylPREDNISolone (SOLU-MEDROL) injection  60 mg Intravenous 3 times per day  . metoprolol  100 mg Oral BID  . midazolam  2 mg Intravenous Once  . pantoprazole (PROTONIX) IV  40 mg Intravenous Q12H  . piperacillin-tazobactam (ZOSYN)  IV  3.375 g Intravenous 3 times per day  . potassium chloride  10 mEq Intravenous Q1 Hr x 4  . pravastatin  10 mg Oral Daily  . pregabalin  50 mg Oral TID  . senna-docusate  1 tablet Oral BID  . vancomycin  1,000 mg Intravenous Q24H    Continuous Medications:  . sodium chloride 75 mL/hr at 05/22/15 0824  .  dexmedetomidine    . feeding supplement (VITAL HIGH PROTEIN) 1,000 mL (05/22/15 0600)  . propofol (DIPRIVAN) infusion 20 mcg/kg/min (05/22/15 0837)   Diprivan at 1352mr providing additional 343 kcals in 24 hr  Electrolyte/Renal Profile and Glucose Profile:   Recent Labs Lab 05/20/15 1925 05/21/15 0450 05/22/15 0407  NA 138 139 138  K 3.7 4.0 3.4*  CL 99* 100* 102  CO2 '29 30 29  '$ BUN 43* 45* 49*  CREATININE 2.10* 1.91* 1.59*  CALCIUM 8.2* 8.4* 7.9*  GLUCOSE 178* 166* 188*   Protein Profile:   Recent Labs Lab 05/07/2015 0036  ALBUMIN 3.4*      Weight Trend since Admission: Filed Weights   05/16/2015 0029 05/21/2015 0900 05/21/15 0520  Weight: 239 lb 10.2 oz (108.7 kg) 238 lb 1.6 oz (108 kg) 232 lb 12.9 oz (105.6 kg)      Diet Order:  Diet NPO time specified  Skin:   reviewed   Height:   Ht Readings from Last 1 Encounters:  05/17/2015 '5\' 7"'$  (1.702 m)    Weight:   Wt Readings from Last 1 Encounters:  05/21/15 232 lb 12.9 oz (105.6 kg)     BMI:  Body mass index is 36.45 kg/(m^2).  Estimated Nutritional Needs:   Kcal:  [id number] (11-14 kcals/kg) using current wt of 105.6 kg  Protein:  92-122 g (1.5-2.0 g/kg)  Fluid:  1525-1830 mL (25-30 ml/kg)   EDUCATION NEEDS:   No education needs identified at this time   HIGH Care Level  Phebe Dettmer B. Zenia Resides, [place], [place] (pager)

## 2015-05-22 NOTE — Progress Notes (Signed)
Was notified there was a visitor in the patient's room with a large open wound. Was stated by staff this visitor had already been spoken with by the [facility] on another day about visiting with an open wound. Patient had large open wound on leg and was wearing shorts, wound completely exposed, noted some drainage from wound.  Explained to visitor he would not be able to visit with an undressed open wound. Explained there was the potential for infection to the patient from his wound as well as potential of infection to his wound. States no one had talked with him about it. Explained he would have to get wound covered prior to visitation. Visitor was hesitant to leave, requested we provide a dressing for his wound and dress it for him. Explained unable to provide dressings for people not patients or dress wounds. Visitor contacted transportation home.

## 2015-05-22 NOTE — Progress Notes (Signed)
RT called by Dr. Stevenson Clinch to place patient in pressure support.  RT told Dr. Stevenson Clinch SBT had already been performed this am and patient failed due to heart rate 150's, respiratory rate 40's, O2 sat 82%, and patient very agitated.  RN Karsten Fells started patient back on propofol when SBT terminated.  Dr. Stevenson Clinch insists patient be placed back in SBT at this time to reassess.  RT to room to place patient back in SBT per MD order.

## 2015-05-23 ENCOUNTER — Other Ambulatory Visit: Payer: Self-pay | Admitting: Oncology

## 2015-05-23 DIAGNOSIS — C3492 Malignant neoplasm of unspecified part of left bronchus or lung: Secondary | ICD-10-CM

## 2015-05-23 DIAGNOSIS — J449 Chronic obstructive pulmonary disease, unspecified: Secondary | ICD-10-CM

## 2015-05-23 DIAGNOSIS — C3490 Malignant neoplasm of unspecified part of unspecified bronchus or lung: Secondary | ICD-10-CM | POA: Insufficient documentation

## 2015-05-23 DIAGNOSIS — I10 Essential (primary) hypertension: Secondary | ICD-10-CM

## 2015-05-23 DIAGNOSIS — E119 Type 2 diabetes mellitus without complications: Secondary | ICD-10-CM

## 2015-05-23 DIAGNOSIS — I509 Heart failure, unspecified: Secondary | ICD-10-CM

## 2015-05-23 LAB — GLUCOSE, CAPILLARY
Glucose-Capillary: 166 mg/dL — ABNORMAL HIGH (ref 65–99)
Glucose-Capillary: 167 mg/dL — ABNORMAL HIGH (ref 65–99)
Glucose-Capillary: 172 mg/dL — ABNORMAL HIGH (ref 65–99)
Glucose-Capillary: 175 mg/dL — ABNORMAL HIGH (ref 65–99)
Glucose-Capillary: 177 mg/dL — ABNORMAL HIGH (ref 65–99)
Glucose-Capillary: 187 mg/dL — ABNORMAL HIGH (ref 65–99)
Glucose-Capillary: 215 mg/dL — ABNORMAL HIGH (ref 65–99)

## 2015-05-23 LAB — CBC
HCT: 41.3 % (ref 35.0–47.0)
HEMOGLOBIN: 13.4 g/dL (ref 12.0–16.0)
MCH: 29.6 pg (ref 26.0–34.0)
MCHC: 32.5 g/dL (ref 32.0–36.0)
MCV: 91.2 fL (ref 80.0–100.0)
Platelets: 74 10*3/uL — ABNORMAL LOW (ref 150–440)
RBC: 4.53 MIL/uL (ref 3.80–5.20)
RDW: 17.8 % — ABNORMAL HIGH (ref 11.5–14.5)
WBC: 3.3 10*3/uL — ABNORMAL LOW (ref 3.6–11.0)

## 2015-05-23 LAB — BASIC METABOLIC PANEL
ANION GAP: 9 (ref 5–15)
BUN: 49 mg/dL — ABNORMAL HIGH (ref 6–20)
CALCIUM: 8.3 mg/dL — AB (ref 8.9–10.3)
CO2: 29 mmol/L (ref 22–32)
CREATININE: 1.3 mg/dL — AB (ref 0.44–1.00)
Chloride: 104 mmol/L (ref 101–111)
GFR, EST AFRICAN AMERICAN: 48 mL/min — AB (ref >60)
GFR, EST NON AFRICAN AMERICAN: 41 mL/min — AB (ref >60)
Glucose, Bld: 187 mg/dL — ABNORMAL HIGH (ref 65–99)
Potassium: 3.8 mmol/L (ref 3.5–5.1)
SODIUM: 142 mmol/L (ref 135–145)

## 2015-05-23 LAB — VANCOMYCIN, TROUGH: VANCOMYCIN TR: 19 ug/mL (ref 10–20)

## 2015-05-23 LAB — TRIGLYCERIDES: TRIGLYCERIDES: 307 mg/dL — AB (ref <150)

## 2015-05-23 LAB — MAGNESIUM: MAGNESIUM: 1.9 mg/dL (ref 1.7–2.4)

## 2015-05-23 LAB — PHOSPHORUS: PHOSPHORUS: 4.1 mg/dL (ref 2.5–4.6)

## 2015-05-23 MED ORDER — LEVOFLOXACIN 500 MG PO TABS
500.0000 mg | ORAL_TABLET | Freq: Every day | ORAL | Status: DC
  Administered 2015-05-23 – 2015-05-24 (×2): 500 mg via ORAL
  Filled 2015-05-23 (×2): qty 1

## 2015-05-23 MED ORDER — FAMOTIDINE 40 MG/5ML PO SUSR
20.0000 mg | Freq: Every day | ORAL | Status: DC
  Administered 2015-05-23: 20 mg via ORAL
  Filled 2015-05-23 (×2): qty 2.5

## 2015-05-23 NOTE — Progress Notes (Signed)
[facility] [place] NAME: Cashlyn Huguley    MR#:  [id number]  DATE OF BIRTH:  1945/08/16  SUBJECTIVE:  CHIEF COMPLAINT:  Intubated but alert, responding appropriately.  REVIEW OF SYSTEMS:   Unable to obtain  DRUG ALLERGIES:   Allergies  Allergen Reactions  . Atorvastatin Other (See Comments)    Does not remember why she had Intolerance to Lipitor.  . Fentanyl Itching  . Rosuvastatin Anxiety    Chest tigtness and feeling as if she had heartburn.    VITALS:  Blood pressure 133/81, pulse 81, temperature 98.3 F (36.8 C), temperature source Oral, resp. rate 23, height '5\' 7"'$  (1.702 m), weight 105.6 kg (232 lb 12.9 oz), SpO2 91 %.  PHYSICAL EXAMINATION:  GENERAL:  69 y.o.-year-old patient lying in the bed, intubated but alert EYES: Pupils equal, round, reactive to light and accommodation. No scleral icterus.  HEENT: Head atraumatic, normocephalic. Oropharynx and nasopharynx clear. ETT in place NECK:  Supple, no jugular venous distention. No thyroid enlargement, no tenderness.  LUNGS: Scattered wheezes and rhonchi, coarse breath sounds, fair air movement  CARDIOVASCULAR: S1, S2 normal. No murmurs, rubs, or gallops.  ABDOMEN: Soft, nontender, nondistended. Bowel sounds present. No organomegaly or mass.  EXTREMITIES: +1 pedal edema, cyanosis, or clubbing.  NEUROLOGIC: CN 2-12 intact, strength 5/5, follows commands, answers questions PSYCHIATRIC: alert, seems oriented, SKIN: Sacral decubitus ulcer   LABORATORY PANEL:   CBC  Recent Labs Lab 05/23/15 0551  WBC 3.3*  HGB 13.4  HCT 41.3  PLT 74*   ------------------------------------------------------------------------------------------------------------------  Chemistries   Recent Labs Lab 05/10/2015 0036  05/23/15 0551  NA 139  < > 142  K 4.7  < > 3.8  CL 94*  < > 104  CO2 36*  < > 29  GLUCOSE 211*  < > 187*  BUN 27*  < > 49*  CREATININE 1.82*  < > 1.30*  CALCIUM  8.9  < > 8.3*  MG  --   --  1.9  AST 24  --   --   ALT 13*  --   --   ALKPHOS 79  --   --   BILITOT 0.8  --   --   < > = values in this interval not displayed. ------------------------------------------------------------------------------------------------------------------  Cardiac Enzymes  Recent Labs Lab 05/06/2015 0036  TROPONINI <0.03   ------------------------------------------------------------------------------------------------------------------  RADIOLOGY:  No results found.  EKG:   Orders placed or performed during the hospital encounter of 05/02/2015  . EKG 12-Lead  . EKG 12-Lead    ASSESSMENT AND PLAN:   1. Acute on chronic respiratory failure with hypoxia and Hypercapnia,  - Multifactorial due to large right-sided pleural effusion/ CHF/ COPD / mass SCLC - Pulmonology following - Continue nebulizer, steroids, antibiotics  2. 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 Acute on chronic kidney disease:  - Due to ATN, hypovolemia and hypotension - Continues to improve - Nephrology signing off  4. Essential hypertension:  - Stable, continue to hold metoprolol and Cozaar  5. Diabetes mellitus type 2: Hold home medications Provide sliding scale insulin  6. Lung mass: Small cell lung cancer - Status post bronchoscopy and 10/25, pathology show small cell lung cancer - Long-term history of heavy smoking - I have discussed pathology reports with the patient and her sister today. - Oncology  following, she will need PET scan once more stable. She will likely need palliative XRT and possible chemotherapy. - Radiation oncology has been consult and  7. Postobstructive pneumonia - Blood cultures negative to date, respiratory, BAL, AFB all pending - Discontinue vancomycin and Zosyn, start Levaquin  All the records  are reviewed and case discussed with the patient's husband and sister at the bedside.  CODE STATUS: Full code  TOTAL CRITICAL CARE TIME TAKING CARE OF THIS PATIENT: 35 minutes.   POSSIBLE D/C IN ?  DAYS, DEPENDING ON CLINICAL CONDITION.   Myrtis Ser M.D on 05/23/2015 at 1:37 PM  Between 7am to 6pm - Pager - [phone] After 6pm go to [email or web address] - password [email or web address] [facility]  Office  [phone]  CC: Primary care physician; Dagoberto Ligas, MD

## 2015-05-23 NOTE — Consult Note (Signed)
[facility] Consultation      Name: Emily Pratt MRN: [id number] DOB: [birth year]    ADMISSION DATE:  05/21/2015  CHIEF COMPLAINT:  Lethargic and resp distress   HISTORY OF PRESENT ILLNESS 05/11/2015  69 yo obese white female admitted to ICU for acute resp failure  The patient presents via EMS complaining of shortness of breath. She reportedly had oxygen saturations as low as 70% when EMS arrived. The patient was on BiPAP at that time. FiO2 was increased to 100% and oxygen saturations improved to only 79%. In the emergency department patient was immediately initiated on BiPAP. She had aggressively become more somnolent.  Chest x-ray showed worsening pleural effusion. Patient on biPAP fio2 at 50% now  Subjective: Patient transition to PSV this morning tolerated about 2-1/2 hours, try to lower settings on PSV, patient became tachypneic and desatted down to lower 80s.    SIGNIFICANT EVENTS  CT chest R lung mass, intubated on 10/25, bronch with biopsy, overnight with some tachycardia but spontaneously resolved.    ICU admission 10/24 10/25>>CT Chest with R lung mass >>intubated 10/25>>bronchoscopy with forceps biopsy of RMS and RUL carina (forceps and FNA)>> small cell  PAST MEDICAL HISTORY    :  Past Medical History  Diagnosis Date  . Anxiety   . Arthritis   . COPD (chronic obstructive pulmonary disease) ([place])   . CHF (congestive heart failure) ([place])   . Hyperlipidemia   . Hypertension   . Diabetes mellitus without complication ([place])   . Chronic kidney disease   . Neuromuscular disorder [facility])    Past Surgical History  Procedure Laterality Date  . Joint replacement Bilateral 2006    both knees  . Ankle surgery     Prior to Admission medications   Medication Sig Start Date End Date Taking? Authorizing Provider  albuterol (PROVENTIL) (2.5 MG/3ML) 0.083% nebulizer solution Take 3 mLs (2.5 mg total) by nebulization every 6 (six) hours as needed for  wheezing or shortness of breath. 05/07/15  Yes Roselee Nova, MD  allopurinol (ZYLOPRIM) 300 MG tablet Take 1 tablet (300 mg total) by mouth daily. 03/06/15  Yes Roselee Nova, MD  budesonide-formoterol [facility]) 160-4.5 MCG/ACT inhaler Inhale 2 puffs into the lungs 2 (two) times daily. 04/24/15  Yes Roselee Nova, MD  colchicine 0.6 MG tablet Take 1 tablet (0.6 mg total) by mouth daily. 03/06/15  Yes Roselee Nova, MD  furosemide (LASIX) 80 MG tablet Take 80 mg by mouth daily.  11/06/14  Yes Historical Provider, MD  JANUVIA 50 MG tablet Take 1 tablet (50 mg total) by mouth daily. 03/06/15  Yes Roselee Nova, MD  losartan (COZAAR) 100 MG tablet Take 1 tablet (100 mg total) by mouth daily. 03/06/15  Yes Roselee Nova, MD  LYRICA 50 MG capsule Take 1 capsule (50 mg total) by mouth 3 (three) times daily. 03/06/15  Yes Roselee Nova, MD  metoprolol (LOPRESSOR) 100 MG tablet Take 1 tablet (100 mg total) by mouth 2 (two) times daily. 03/06/15  Yes Roselee Nova, MD  oxyCODONE-acetaminophen (PERCOCET) 10-325 MG tablet Take 1 tablet by mouth every 6 (six) hours as needed for pain. 05/07/15  Yes Roselee Nova, MD  pravastatin (PRAVACHOL) 10 MG tablet Take 10 mg by mouth daily.   Yes Historical Provider, MD  tiotropium (SPIRIVA HANDIHALER) 18 MCG inhalation capsule Place 1 capsule (18 mcg total) into inhaler and inhale daily.  05/09/15  Yes Roselee Nova, MD  VENTOLIN HFA 108 (90 BASE) MCG/ACT inhaler Inhale 1 puff into the lungs every 4 (four) hours as needed.  03/25/15  Yes Historical Provider, MD  predniSONE (DELTASONE) 10 MG tablet Take 1 tablet (10 mg total) by mouth daily with breakfast. Patient not taking: Reported on 05/11/2015 04/25/15   Bettey Costa, MD   Allergies  Allergen Reactions  . Atorvastatin Other (See Comments)    Does not remember why she had Intolerance to Lipitor.  . Fentanyl Itching  . Rosuvastatin Anxiety    Chest tigtness and feeling as if she had heartburn.       FAMILY HISTORY   Family History  Problem Relation Age of Onset  . Diabetes Mother   . Cancer Father   . Diabetes Sister   . Stroke Sister       SOCIAL HISTORY    reports that she has been smoking Cigarettes.  She does not have any smokeless tobacco history on file. She reports that she does not drink alcohol or use illicit drugs.  Review of Systems  Unable to perform ROS: critical illness      VITAL SIGNS    Temp:  [98.3 F (36.8 C)-99.3 F (37.4 C)] 98.3 F (36.8 C) (10/28 0800) Pulse Rate:  [62-98] 87 (10/28 1200) Resp:  [0-29] 21 (10/28 1200) BP: (133-160)/(67-88) 154/87 mmHg (10/28 1100) SpO2:  [84 %-96 %] 91 % (10/28 1200) FiO2 (%):  [40 %] 40 % (10/28 0818) Weight:  [232 lb 12.9 oz (105.6 kg)] 232 lb 12.9 oz (105.6 kg) (10/28 0500) HEMODYNAMICS:   VENTILATOR SETTINGS: Vent Mode:  [-] PRVC FiO2 (%):  [40 %] 40 % Set Rate:  [20 bmp] 20 bmp Vt Set:  [500 mL] 500 mL PEEP:  [7 cmH20] 7 cmH20 Plateau Pressure:  [24 cmH20-31 cmH20] 24 cmH20 INTAKE / OUTPUT:  Intake/Output Summary (Last 24 hours) at 05/23/15 1204 Last data filed at 05/23/15 1200  Gross per 24 hour  Intake 4751.49 ml  Output   2110 ml  Net 2641.49 ml       PHYSICAL EXAM   Physical Exam  Constitutional: She appears well-developed and well-nourished.  HENT:  Head: Normocephalic and atraumatic.  Eyes: EOM are normal. Pupils are equal, round, and reactive to light.  Neck: Normal range of motion. Neck supple.  Cardiovascular: Normal rate, regular rhythm and normal heart sounds.   No murmur heard. Pulmonary/Chest:  Intubated. Shallow BS at the bases, dec BS at on the right, mild coarse upper airway sounds.   Abdominal: Soft. Bowel sounds are normal.  Musculoskeletal: Normal range of motion. She exhibits no edema.  Neurological: She displays normal reflexes. Coordination normal.  Skin: Skin is warm.  Psychiatric: She has a normal mood and affect.  Nursing note and vitals  reviewed.      LABS    Recent Labs Lab 05/21/15 0450 05/22/15 0407 05/23/15 0551  WBC 5.6 4.5 3.3*  HGB 12.5 12.4 13.4  HCT 37.7 37.7 41.3  PLT 84* 72* 74*   Coag's  Recent Labs Lab 05/22/15 1217  APTT 25  INR 1.04   BMET  Recent Labs Lab 05/21/15 0450 05/22/15 0407 05/23/15 0551  NA 139 138 142  K 4.0 3.4* 3.8  CL 100* 102 104  CO2 '30 29 29  '$ BUN 45* 49* 49*  CREATININE 1.91* 1.59* 1.30*  GLUCOSE 166* 188* 187*   Electrolytes  Recent Labs Lab 05/21/15 0450 05/22/15 0407 05/23/15 0551  CALCIUM 8.4* 7.9* 8.3*  MG  --   --  1.9  PHOS  --   --  4.1   Sepsis Markers  Recent Labs Lab 05/26/2015 0113 04/29/2015 0410  LATICACIDVEN 2.3* 1.0   ABG  Recent Labs Lab 05/20/15 0935 05/20/15 1430 05/21/15 0845  PHART 7.45 7.37 7.42  PCO2ART 46 59* 52*  PO2ART 62* 62* 79*   Liver Enzymes  Recent Labs Lab 05/13/2015 0036  AST 24  ALT 13*  ALKPHOS 79  BILITOT 0.8  ALBUMIN 3.4*   Cardiac Enzymes  Recent Labs Lab 05/10/2015 0036  TROPONINI <0.03   Glucose  Recent Labs Lab 05/22/15 1636 05/22/15 2012 05/23/15 0006 05/23/15 0529 05/23/15 0730 05/23/15 1129  GLUCAP 173* 214* 215* 187* 175* 167*     Recent Results (from the past 240 hour(s))  MRSA PCR Screening     Status: None   Collection Time: 05/09/2015 12:28 AM  Result Value Ref Range Status   MRSA by PCR NEGATIVE NEGATIVE Final    Comment:        The GeneXpert MRSA Assay (FDA approved for NASAL specimens only), is one component of a comprehensive MRSA colonization surveillance program. It is not intended to diagnose MRSA infection nor to guide or monitor treatment for MRSA infections.   Culture, blood (routine x 2)     Status: None (Preliminary result)   Collection Time: 05/23/2015  1:13 AM  Result Value Ref Range Status   Specimen Description BLOOD RIGHT HAND  Final   Special Requests BOTTLES DRAWN AEROBIC AND ANAEROBIC 3CC  Final   Culture NO GROWTH 3 DAYS  Final    Report Status PENDING  Incomplete  Culture, blood (routine x 2)     Status: None (Preliminary result)   Collection Time: 05/02/2015  1:13 AM  Result Value Ref Range Status   Specimen Description BLOOD RIGHT ASSIST CONTROL  Final   Special Requests BOTTLES DRAWN AEROBIC AND ANAEROBIC 4CC  Final   Culture NO GROWTH 3 DAYS  Final   Report Status PENDING  Incomplete  Culture, expectorated sputum-assessment     Status: None   Collection Time: 05/20/15  2:00 PM  Result Value Ref Range Status   Specimen Description EXPECTORATED SPUTUM  Final   Special Requests NONE  Final   Sputum evaluation THIS SPECIMEN IS ACCEPTABLE FOR SPUTUM CULTURE  Final   Report Status 05/20/2015 FINAL  Final  Culture, respiratory (NON-Expectorated)     Status: None   Collection Time: 05/20/15  2:00 PM  Result Value Ref Range Status   Specimen Description EXPECTORATED SPUTUM  Final   Special Requests NONE Reflexed from [id number]  Final   Gram Stain   Final    FEW WBC SEEN FEW GRAM POSITIVE RODS FAIR SPECIMEN - 70-80% WBCS    Culture APPEARS TO BE NORMAL FLORA  Final   Report Status 05/22/2015 FINAL  Final  Culture, bal-quantitative     Status: None   Collection Time: 05/20/15  5:05 PM  Result Value Ref Range Status   Specimen Description BRONCHIAL ALVEOLAR LAVAGE  Final   Special Requests Normal  Final   Gram Stain   Final    MODERATE WBC SEEN RARE GRAM POSITIVE RODS RARE GRAM POSITIVE COCCI IN CHAINS GOOD SPECIMEN - 80-90% WBCS    Culture APPEARS TO BE NORMAL FLORA  Final   Report Status 05/22/2015 FINAL  Final  Fungus Culture with Smear     Status: None (Preliminary result)   Collection Time:  05/20/15  5:05 PM  Result Value Ref Range Status   Specimen Description SPUTUM  Final   Special Requests Normal  Final   Fungal Smear PENDING  Incomplete   Culture NO FUNGUS ISOLATED AFTER 2 DAYS  Final   Report Status PENDING  Incomplete     Current facility-administered medications:  .  0.9 %  sodium chloride  infusion, , Intravenous, Continuous, Latasha Puskas, MD, Last Rate: 75 mL/hr at 05/23/15 1200 .  acetaminophen (TYLENOL) tablet 650 mg, 650 mg, Oral, Q6H PRN **OR** acetaminophen (TYLENOL) suppository 650 mg, 650 mg, Rectal, Q6H PRN, Harrie Foreman, MD .  antiseptic oral rinse solution (CORINZ), 7 mL, Mouth Rinse, QID, Estes Lehner, MD, 7 mL at 05/23/15 1156 .  azithromycin (ZITHROMAX) 500 mg in dextrose 5 % 250 mL IVPB, 500 mg, Intravenous, Q24H, Charlett Nose, RPH, 500 mg at 05/22/15 1813 .  budesonide (PULMICORT) nebulizer solution 0.5 mg, 0.5 mg, Nebulization, BID, Flora Lipps, MD, 0.5 mg at 05/23/15 0818 .  chlorhexidine gluconate (PERIDEX) 0.12 % solution 15 mL, 15 mL, Mouth Rinse, BID, Leiland Mihelich, MD, 15 mL at 05/23/15 0800 .  colchicine tablet 0.6 mg, 0.6 mg, Oral, Daily, Harrie Foreman, MD, 0.6 mg at 05/23/15 0911 .  dexmedetomidine (PRECEDEX) 400 MCG/100ML (4 mcg/mL) infusion, 0-1.2 mcg/kg/hr, Intravenous, Continuous, Shaylinn Hladik, MD, Last Rate: 10.6 mL/hr at 05/23/15 1200, 0.4 mcg/kg/hr at 05/23/15 1200 .  feeding supplement (VITAL HIGH PROTEIN) liquid 1,000 mL, 1,000 mL, Per Tube, Continuous, Hobie Kohles, MD, Last Rate: 50 mL/hr at 05/23/15 1200, 1,000 mL at 05/23/15 1200 .  free water 200 mL, 200 mL, Per Tube, 3 times per day, Kinga Cassar, MD, 200 mL at 05/23/15 0600 .  insulin aspart (novoLOG) injection 0-9 Units, 0-9 Units, Subcutaneous, 6 times per day, Demetrios Loll, MD, 2 Units at 05/23/15 1156 .  ipratropium-albuterol (DUONEB) 0.5-2.5 (3) MG/3ML nebulizer solution 3 mL, 3 mL, Nebulization, Q6H, Makenlee Mckeag, MD, 3 mL at 05/23/15 0818 .  linagliptin (TRADJENTA) tablet 5 mg, 5 mg, Oral, Daily, Harrie Foreman, MD, 5 mg at 05/23/15 7026 .  losartan (COZAAR) tablet 100 mg, 100 mg, Oral, Daily, Harrie Foreman, MD, 100 mg at 05/23/15 0909 .  methylPREDNISolone sodium succinate (SOLU-MEDROL) 125 mg/2 mL injection 60 mg, 60 mg, Intravenous, 3 times per day, Nicholes Mango,  MD, 60 mg at 05/23/15 0535 .  metoprolol (LOPRESSOR) tablet 100 mg, 100 mg, Oral, BID, Harrie Foreman, MD, 100 mg at 05/23/15 3785 .  midazolam (VERSED) injection 1 mg, 1 mg, Intravenous, Q15 min PRN, Makari Portman, MD .  midazolam (VERSED) injection 1 mg, 1 mg, Intravenous, Q2H PRN, Vilinda Boehringer, MD, 1 mg at 05/22/15 0823 .  midazolam (VERSED) injection 2 mg, 2 mg, Intravenous, Once, Anuel Sitter, MD, 2 mg at 05/20/15 1615 .  morphine 2 MG/ML injection 2 mg, 2 mg, Intravenous, Q4H PRN, Harrie Foreman, MD, 2 mg at 05/20/15 0325 .  ondansetron (ZOFRAN) tablet 4 mg, 4 mg, Oral, Q6H PRN **OR** ondansetron (ZOFRAN) injection 4 mg, 4 mg, Intravenous, Q6H PRN, Harrie Foreman, MD .  pantoprazole (PROTONIX) injection 40 mg, 40 mg, Intravenous, Q12H, Aldean Jewett, MD, 40 mg at 05/23/15 1104 .  piperacillin-tazobactam (ZOSYN) IVPB 3.375 g, 3.375 g, Intravenous, 3 times per day, Flora Lipps, MD, 3.375 g at 05/23/15 0535 .  pravastatin (PRAVACHOL) tablet 10 mg, 10 mg, Oral, Daily, Harrie Foreman, MD, 10 mg at 05/23/15 0909 .  pregabalin (LYRICA) capsule 50  mg, 50 mg, Oral, TID, Harrie Foreman, MD, 50 mg at 05/23/15 0910 .  propofol (DIPRIVAN) 1000 MG/100ML infusion, 0-50 mcg/kg/min, Intravenous, Continuous, Vilinda Boehringer, MD, Stopped at 05/22/15 1333 .  senna-docusate (Senokot-S) tablet 1 tablet, 1 tablet, Oral, BID, Vilinda Boehringer, MD, 1 tablet at 05/22/15 2226 .  vancomycin (VANCOCIN) IVPB 1000 mg/200 mL premix, 1,000 mg, Intravenous, Q24H, Aruna Gouru, MD, 1,000 mg at 05/22/15 2226  IMAGING    No results found.  INDWELLING DEVICES:: R Fem CVL 10/25>>  MICRO DATA: MRSA PCR >>neg BAL 10/25>>  ANTIMICROBIALS:  Zosyn/vancomycin 10/24>>    ASSESSMENT/PLAN   69 yo obese white female admitted to ICU for acute hypercapnic and hypoxic resp failure acute COPD and CHF exacerbation from probable pneumonia  PULMONARY Respiratory Failure - intubated on 10/25 - cont with MV, wean  as tolerated, patient tolerating PSV, however in trying to lower PSV settings she is desaturating, we will continue to attempt weaning as tolerated - continue Bronchodilator Therapy COPD  And CHF exacerbation -BD therapy, IV steroids -ABG PRN Lung Mass - RUL bulky adenopathy - s/p bronch with biopsy on 10/25>> small cell lung cancer - left adrenal mass seen on CT - will also need biopsy for staging; this can be done as an outpatient in the future  CARDIOVASCULAR - cont with ICU monitoring - not on vasopressors  RENAL Adrenal mass - no lasix at this time - UOP improving, Cr today - 1.91 - adrenal mass will require biopsy in the near future.    GASTROINTESTINAL - TF feeds  HEMATOLOGIC Follow CBC Thrombocytopenia - critical illness, hold heparin for now HIT Panel 10/26>> DVT Prophylaxis - SCDs, agatroban  INFECTIOUS Empiric abx for pneumonia -vanc/sozyn  ENDOCRINE - ICU hypoglycemic\Hyperglycemia protocol   NEUROLOGIC - Rass 0 - CT head to evaluate for any lesions based on biopsy results from lung biopsy   I have personally obtained a history, examined the patient, evaluated laboratory and independently reviewed  imaging results, formulated the assessment and plan and placed orders.  The Patient requires high complexity decision making for assessment and support, frequent evaluation and titration of therapies, application of advanced monitoring technologies and extensive interpretation of multiple databases. Critical Care Time devoted to patient care services described in this note is 35 minutes.   Overall, patient is critically ill, prognosis is guarded. Patient at high risk for cardiac arrest and death.    Vilinda Boehringer, MD [facility] Pager [phone] (please enter 7-digits) On Call Pager - [phone] (please enter 7-digits)

## 2015-05-23 NOTE — Progress Notes (Signed)
No social work needs at this time. Clinical Social Worker will continue to follow patient to assist with ongoing and disposition needs.  Casimer Lanius. Latanya Presser, MSW Clinical Social Work Department [phone] 1:26 PM

## 2015-05-23 NOTE — Progress Notes (Signed)
Pharmacy Consult for Electrolyte Monitoring   Allergies  Allergen Reactions  . Atorvastatin Other (See Comments)    Does not remember why she had Intolerance to Lipitor.  . Fentanyl Itching  . Rosuvastatin Anxiety    Chest tigtness and feeling as if she had heartburn.    Patient Measurements: Height: '5\' 7"'$  (170.2 cm) Weight: 232 lb 12.9 oz (105.6 kg) IBW/kg (Calculated) : 61.6   Vital Signs: Temp: 98.3 F (36.8 C) (10/28 1200) Temp Source: Oral (10/28 1200) BP: 133/81 mmHg (10/28 1300) Pulse Rate: 81 (10/28 1300) Intake/Output from previous day: 10/27 0701 - 10/28 0700 In: 6705.2 [I.V.:4005.2; NG/GT:1200; IV Piggyback:1500] Out: 2780 [Urine:2780] Intake/Output from this shift: Total I/O In: 813.6 [I.V.:513.6; NG/GT:300] Out: 185 [Urine:185]  Labs:  Recent Labs  05/21/15 0450 05/22/15 0407 05/22/15 1217 05/23/15 0551  WBC 5.6 4.5  --  3.3*  HGB 12.5 12.4  --  13.4  HCT 37.7 37.7  --  41.3  PLT 84* 72*  --  74*  APTT  --   --  25  --   INR  --   --  1.04  --      Recent Labs  05/21/15 0450 05/22/15 0407 05/23/15 0551  NA 139 138 142  K 4.0 3.4* 3.8  CL 100* 102 104  CO2 '30 29 29  '$ GLUCOSE 166* 188* 187*  BUN 45* 49* 49*  CREATININE 1.91* 1.59* 1.30*  CALCIUM 8.4* 7.9* 8.3*  MG  --   --  1.9  PHOS  --   --  4.1   Estimated Creatinine Clearance: 52.5 mL/min (by C-G formula based on Cr of 1.3).    Recent Labs  05/23/15 0529 05/23/15 0730 05/23/15 1129  GLUCAP 187* 175* 167*    Medical History: Past Medical History  Diagnosis Date  . Anxiety   . Arthritis   . COPD (chronic obstructive pulmonary disease) ([place])   . CHF (congestive heart failure) ([place])   . Hyperlipidemia   . Hypertension   . Diabetes mellitus without complication ([place])   . Chronic kidney disease   . Neuromuscular disorder ([facility])     Medications:  Scheduled:  . antiseptic oral rinse  7 mL Mouth Rinse QID  . azithromycin  500 mg Intravenous Q24H  . budesonide  (PULMICORT) nebulizer solution  0.5 mg Nebulization BID  . chlorhexidine gluconate  15 mL Mouth Rinse BID  . colchicine  0.6 mg Oral Daily  . free water  200 mL Per Tube 3 times per day  . insulin aspart  0-9 Units Subcutaneous 6 times per day  . ipratropium-albuterol  3 mL Nebulization Q6H  . linagliptin  5 mg Oral Daily  . losartan  100 mg Oral Daily  . methylPREDNISolone (SOLU-MEDROL) injection  60 mg Intravenous 3 times per day  . metoprolol  100 mg Oral BID  . midazolam  2 mg Intravenous Once  . pantoprazole (PROTONIX) IV  40 mg Intravenous Q12H  . piperacillin-tazobactam (ZOSYN)  IV  3.375 g Intravenous 3 times per day  . pravastatin  10 mg Oral Daily  . pregabalin  50 mg Oral TID  . senna-docusate  1 tablet Oral BID  . vancomycin  1,000 mg Intravenous Q24H   Infusions:  . sodium chloride 75 mL/hr at 05/23/15 1300  . dexmedetomidine 0.4 mcg/kg/hr (05/23/15 1300)  . feeding supplement (VITAL HIGH PROTEIN) 1,000 mL (05/23/15 1300)  . propofol (DIPRIVAN) infusion Stopped (05/22/15 1333)    Assessment: Pharmacy consulted to assist in  managing electrolytes in this 69 y/o F with acute respiratory failure due to CHF/COPD/PNA.   Plan:  Electrolytes are wnl. Will f/u AM labs.   Ulice Dash D 05/23/2015,1:30 PM

## 2015-05-23 NOTE — Progress Notes (Signed)
ANTIBIOTIC CONSULT NOTE - INITIAL  Pharmacy Consult for Vancomycin/Zosyn Indication: pneumonia  Allergies  Allergen Reactions  . Atorvastatin Other (See Comments)    Does not remember why she had Intolerance to Lipitor.  . Fentanyl Itching  . Rosuvastatin Anxiety    Chest tigtness and feeling as if she had heartburn.    Patient Measurements: Height: '5\' 7"'$  (170.2 cm) Weight: 232 lb 12.9 oz (105.6 kg) IBW/kg (Calculated) : 61.6 Adjusted Body Weight: 80.2 kg  Vital Signs: Temp: 98.3 F (36.8 C) (10/28 1200) Temp Source: Oral (10/28 1200) BP: 133/81 mmHg (10/28 1300) Pulse Rate: 81 (10/28 1300) Intake/Output from previous day: 10/27 0701 - 10/28 0700 In: 6705.2 [I.V.:4005.2; NG/GT:1200; IV Piggyback:1500] Out: 2780 [Urine:2780] Intake/Output from this shift: Total I/O In: 813.6 [I.V.:513.6; NG/GT:300] Out: 185 [Urine:185]  Labs:  Recent Labs  05/21/15 0450 05/22/15 0407 05/23/15 0551  WBC 5.6 4.5 3.3*  HGB 12.5 12.4 13.4  PLT 84* 72* 74*  CREATININE 1.91* 1.59* 1.30*   Estimated Creatinine Clearance: 52.5 mL/min (by C-G formula based on Cr of 1.3). No results for input(s): VANCOTROUGH, VANCOPEAK, VANCORANDOM, GENTTROUGH, GENTPEAK, GENTRANDOM, TOBRATROUGH, TOBRAPEAK, TOBRARND, AMIKACINPEAK, AMIKACINTROU, AMIKACIN in the last 72 hours.   Microbiology: Recent Results (from the past 720 hour(s))  Blood culture (routine x 2)     Status: None   Collection Time: 04/24/15  6:31 PM  Result Value Ref Range Status   Specimen Description BLOOD LEFT ASSIST CONTROL  Final   Special Requests BOTTLES DRAWN AEROBIC AND ANAEROBIC  4CC  Final   Culture NO GROWTH 6 DAYS  Final   Report Status 04/30/2015 FINAL  Final  Blood culture (routine x 2)     Status: None   Collection Time: 04/24/15  6:31 PM  Result Value Ref Range Status   Specimen Description BLOOD LEFT HAND  Final   Special Requests BOTTLES DRAWN AEROBIC AND ANAEROBIC  2CC  Final   Culture NO GROWTH 6 DAYS  Final    Report Status 04/30/2015 FINAL  Final  MRSA PCR Screening     Status: None   Collection Time: 05/05/2015 12:28 AM  Result Value Ref Range Status   MRSA by PCR NEGATIVE NEGATIVE Final    Comment:        The GeneXpert MRSA Assay (FDA approved for NASAL specimens only), is one component of a comprehensive MRSA colonization surveillance program. It is not intended to diagnose MRSA infection nor to guide or monitor treatment for MRSA infections.   Culture, blood (routine x 2)     Status: None (Preliminary result)   Collection Time: 05/20/2015  1:13 AM  Result Value Ref Range Status   Specimen Description BLOOD RIGHT HAND  Final   Special Requests BOTTLES DRAWN AEROBIC AND ANAEROBIC 3CC  Final   Culture NO GROWTH 3 DAYS  Final   Report Status PENDING  Incomplete  Culture, blood (routine x 2)     Status: None (Preliminary result)   Collection Time: 05/05/2015  1:13 AM  Result Value Ref Range Status   Specimen Description BLOOD RIGHT ASSIST CONTROL  Final   Special Requests BOTTLES DRAWN AEROBIC AND ANAEROBIC 4CC  Final   Culture NO GROWTH 3 DAYS  Final   Report Status PENDING  Incomplete  Culture, expectorated sputum-assessment     Status: None   Collection Time: 05/20/15  2:00 PM  Result Value Ref Range Status   Specimen Description EXPECTORATED SPUTUM  Final   Special Requests NONE  Final   Sputum  evaluation THIS SPECIMEN IS ACCEPTABLE FOR SPUTUM CULTURE  Final   Report Status 05/20/2015 FINAL  Final  Culture, respiratory (NON-Expectorated)     Status: None   Collection Time: 05/20/15  2:00 PM  Result Value Ref Range Status   Specimen Description EXPECTORATED SPUTUM  Final   Special Requests NONE Reflexed from [id number]  Final   Gram Stain   Final    FEW WBC SEEN FEW GRAM POSITIVE RODS FAIR SPECIMEN - 70-80% WBCS    Culture APPEARS TO BE NORMAL FLORA  Final   Report Status 05/22/2015 FINAL  Final  Culture, bal-quantitative     Status: None   Collection Time: 05/20/15  5:05 PM   Result Value Ref Range Status   Specimen Description BRONCHIAL ALVEOLAR LAVAGE  Final   Special Requests Normal  Final   Gram Stain   Final    MODERATE WBC SEEN RARE GRAM POSITIVE RODS RARE GRAM POSITIVE COCCI IN CHAINS GOOD SPECIMEN - 80-90% WBCS    Culture APPEARS TO BE NORMAL FLORA  Final   Report Status 05/22/2015 FINAL  Final  Fungus Culture with Smear     Status: None (Preliminary result)   Collection Time: 05/20/15  5:05 PM  Result Value Ref Range Status   Specimen Description SPUTUM  Final   Special Requests Normal  Final   Fungal Smear PENDING  Incomplete   Culture NO FUNGUS ISOLATED AFTER 2 DAYS  Final   Report Status PENDING  Incomplete    Medical History: Past Medical History  Diagnosis Date  . Anxiety   . Arthritis   . COPD (chronic obstructive pulmonary disease) ([place])   . CHF (congestive heart failure) ([place])   . Hyperlipidemia   . Hypertension   . Diabetes mellitus without complication ([place])   . Chronic kidney disease   . Neuromuscular disorder (H[facility])     Medications:  Scheduled:  . antiseptic oral rinse  7 mL Mouth Rinse QID  . azithromycin  500 mg Intravenous Q24H  . budesonide (PULMICORT) nebulizer solution  0.5 mg Nebulization BID  . chlorhexidine gluconate  15 mL Mouth Rinse BID  . colchicine  0.6 mg Oral Daily  . free water  200 mL Per Tube 3 times per day  . insulin aspart  0-9 Units Subcutaneous 6 times per day  . ipratropium-albuterol  3 mL Nebulization Q6H  . linagliptin  5 mg Oral Daily  . losartan  100 mg Oral Daily  . methylPREDNISolone (SOLU-MEDROL) injection  60 mg Intravenous 3 times per day  . metoprolol  100 mg Oral BID  . midazolam  2 mg Intravenous Once  . pantoprazole (PROTONIX) IV  40 mg Intravenous Q12H  . piperacillin-tazobactam (ZOSYN)  IV  3.375 g Intravenous 3 times per day  . pravastatin  10 mg Oral Daily  . pregabalin  50 mg Oral TID  . senna-docusate  1 tablet Oral BID  . vancomycin  1,000 mg Intravenous Q24H    Infusions:  . sodium chloride 75 mL/hr at 05/23/15 1300  . dexmedetomidine 0.4 mcg/kg/hr (05/23/15 1300)  . feeding supplement (VITAL HIGH PROTEIN) 1,000 mL (05/23/15 1300)  . propofol (DIPRIVAN) infusion Stopped (05/22/15 1333)   Assessment: 68 y/o F with acute respiratory failure due to COPD and CHF exacerbation and possible PNA.   Goal of Therapy:  Vancomycin trough level 15-20 mcg/ml  Plan:  Although SCr continues to improve, will continue vancomycin dosing of 1000 mg iv q 24 hours with cultures negative and patient at  risk for accumulation. Trough scheduled with the 4th dose representing steady state. Will continue Zosyn 3.375 g EI q 8 hours. Antibiotics to continue for a total of 7 days per MD. Will f/u renal function and culture results.   Ulice Dash D 05/23/2015,1:31 PM

## 2015-05-23 NOTE — Progress Notes (Signed)
UOP improved, Cr currently 1.3, no further renal input at this time, therefore will sign off, please call back if questions.

## 2015-05-23 NOTE — Progress Notes (Signed)
[facility]  [phone] [phone]  ID: MCKINNA DEMARS OB: 1946/06/15  MR#: [id number]  WNI#:[id number]  Patient Care Team: Dagoberto Ligas, MD as PCP - General (Internal Medicine)  CHIEF COMPLAINT:  Chief Complaint  Patient presents with  . Respiratory Distress    INTERVAL HISTORY: Patient alert, but still intubated. Husband is at bedside. Extubation has been difficult, likely secondary to external compression of her biopsy proven small cell lung cancer. Review of systems is difficult, but patient offers no other complaints.  REVIEW OF SYSTEMS:   Review of Systems  Unable to perform ROS: intubated    As per HPI. Otherwise, a complete review of systems is negatve.  PAST MEDICAL HISTORY: Past Medical History  Diagnosis Date  . Anxiety   . Arthritis   . COPD (chronic obstructive pulmonary disease) ([place])   . CHF (congestive heart failure) ([place])   . Hyperlipidemia   . Hypertension   . Diabetes mellitus without complication ([place])   . Chronic kidney disease   . Neuromuscular disorder ([place])     PAST SURGICAL HISTORY: Past Surgical History  Procedure Laterality Date  . Joint replacement Bilateral 2006    both knees  . Ankle surgery      FAMILY HISTORY Family History  Problem Relation Age of Onset  . Diabetes Mother   . Cancer Father   . Diabetes Sister   . Stroke Sister        ADVANCED DIRECTIVES:    HEALTH MAINTENANCE: Social History  Substance Use Topics  . Smoking status: Current Every Day Smoker    Types: Cigarettes  . Smokeless tobacco: None     Comment: 0.5 pack /day  . Alcohol Use: No     Colonoscopy:  PAP:  Bone density:  Lipid panel:  Allergies  Allergen Reactions  . Atorvastatin Other (See Comments)    Does not remember why she had Intolerance to Lipitor.  . Fentanyl Itching  . Rosuvastatin Anxiety    Chest tigtness and feeling as if she had heartburn.    Current Facility-Administered  Medications  Medication Dose Route Frequency Provider Last Rate Last Dose  . 0.9 %  sodium chloride infusion   Intravenous Continuous Vishal Mungal, MD 75 mL/hr at 05/23/15 1500    . acetaminophen (TYLENOL) tablet 650 mg  650 mg Oral Q6H PRN Harrie Foreman, MD   650 mg at 05/23/15 1217   Or  . acetaminophen (TYLENOL) suppository 650 mg  650 mg Rectal Q6H PRN Harrie Foreman, MD      . antiseptic oral rinse solution (CORINZ)  7 mL Mouth Rinse QID Vishal Mungal, MD   7 mL at 05/23/15 1512  . budesonide (PULMICORT) nebulizer solution 0.5 mg  0.5 mg Nebulization BID Flora Lipps, MD   0.5 mg at 05/23/15 0818  . chlorhexidine gluconate (PERIDEX) 0.12 % solution 15 mL  15 mL Mouth Rinse BID Vishal Mungal, MD   15 mL at 05/23/15 0800  . colchicine tablet 0.6 mg  0.6 mg Oral Daily Harrie Foreman, MD   0.6 mg at 05/23/15 0911  . dexmedetomidine (PRECEDEX) 400 MCG/100ML (4 mcg/mL) infusion  0-1.2 mcg/kg/hr Intravenous Continuous Vishal Mungal, MD 10.6 mL/hr at 05/23/15 1500 0.4 mcg/kg/hr at 05/23/15 1500  . feeding supplement (VITAL HIGH PROTEIN) liquid 1,000 mL  1,000 mL Per Tube Continuous Vishal Mungal, MD 50 mL/hr at 05/23/15 1500 1,000 mL at 05/23/15 1500  . free water 200 mL  200 mL Per Tube 3  times per day Vilinda Boehringer, MD   200 mL at 05/23/15 1400  . insulin aspart (novoLOG) injection 0-9 Units  0-9 Units Subcutaneous 6 times per day Demetrios Loll, MD   2 Units at 05/23/15 1156  . ipratropium-albuterol (DUONEB) 0.5-2.5 (3) MG/3ML nebulizer solution 3 mL  3 mL Nebulization Q6H Vishal Mungal, MD   3 mL at 05/23/15 1427  . levofloxacin (LEVAQUIN) tablet 500 mg  500 mg Oral Daily Aldean Jewett, MD      . linagliptin (TRADJENTA) tablet 5 mg  5 mg Oral Daily Harrie Foreman, MD   5 mg at 05/23/15 1610  . losartan (COZAAR) tablet 100 mg  100 mg Oral Daily Harrie Foreman, MD   100 mg at 05/23/15 0909  . methylPREDNISolone sodium succinate (SOLU-MEDROL) 125 mg/2 mL injection 60 mg  60 mg  Intravenous 3 times per day Nicholes Mango, MD   60 mg at 05/23/15 1336  . metoprolol (LOPRESSOR) tablet 100 mg  100 mg Oral BID Harrie Foreman, MD   100 mg at 05/23/15 9604  . midazolam (VERSED) injection 1 mg  1 mg Intravenous Q15 min PRN Vishal Mungal, MD      . midazolam (VERSED) injection 1 mg  1 mg Intravenous Q2H PRN Vishal Mungal, MD   1 mg at 05/22/15 0823  . midazolam (VERSED) injection 2 mg  2 mg Intravenous Once Vishal Mungal, MD   2 mg at 05/20/15 1615  . morphine 2 MG/ML injection 2 mg  2 mg Intravenous Q4H PRN Harrie Foreman, MD   2 mg at 05/20/15 0325  . ondansetron (ZOFRAN) tablet 4 mg  4 mg Oral Q6H PRN Harrie Foreman, MD       Or  . ondansetron [facility]) injection 4 mg  4 mg Intravenous Q6H PRN Harrie Foreman, MD      . pantoprazole (PROTONIX) injection 40 mg  40 mg Intravenous Q12H Aldean Jewett, MD   40 mg at 05/23/15 1104  . pravastatin (PRAVACHOL) tablet 10 mg  10 mg Oral Daily Harrie Foreman, MD   10 mg at 05/23/15 5409  . pregabalin (LYRICA) capsule 50 mg  50 mg Oral TID Harrie Foreman, MD   50 mg at 05/23/15 1513  . propofol (DIPRIVAN) 1000 MG/100ML infusion  0-50 mcg/kg/min Intravenous Continuous Vilinda Boehringer, MD   Stopped at 05/22/15 1333  . senna-docusate (Senokot-S) tablet 1 tablet  1 tablet Oral BID Vilinda Boehringer, MD   1 tablet at 05/22/15 2226    OBJECTIVE: Filed Vitals:   05/23/15 1500  BP: 171/125  Pulse: 92  Temp:   Resp: 17     Body mass index is 36.45 kg/(m^2).    ECOG FS:4 - Bedbound  General:  intubated, but alert Eyes: Pink conjunctiva, anicteric sclera. HEENT:  ET tube in place. Lungs: Clear to auscultation bilaterally. Heart: Regular rate and rhythm. No rubs, murmurs, or gallops. Abdomen: Soft, nontender, nondistended. No organomegaly noted, normoactive bowel sounds. Musculoskeletal: No edema, cyanosis, or clubbing. Neuro: Alert, answering all questions appropriately.  Skin: No rashes or petechiae noted.   LAB  RESULTS:  Lab Results  Component Value Date   NA 142 05/23/2015   K 3.8 05/23/2015   CL 104 05/23/2015   CO2 29 05/23/2015   GLUCOSE 187* 05/23/2015   BUN 49* 05/23/2015   CREATININE 1.30* 05/23/2015   CALCIUM 8.3* 05/23/2015   PROT 7.1 04/29/2015   ALBUMIN 3.4* 05/25/2015   AST 24 05/13/2015  ALT 13* 04/29/2015   ALKPHOS 79 04/29/2015   BILITOT 0.8 05/08/2015   GFRNONAA 41* 05/23/2015   GFRAA 48* 05/23/2015    Lab Results  Component Value Date   WBC 3.3* 05/23/2015   NEUTROABS 4.1 04/24/2015   HGB 13.4 05/23/2015   HCT 41.3 05/23/2015   MCV 91.2 05/23/2015   PLT 74* 05/23/2015     STUDIES: Ct Chest Wo Contrast  05/20/2015  CLINICAL DATA:  69 year old who presented with acute hypercapnic respiratory failure, current history of COPD and CHF, stage 3 chronic kidney disease, with an enlarging right pleural effusion and right paratracheal soft tissue on chest x-ray yesterday. EXAM: CT CHEST WITHOUT CONTRAST TECHNIQUE: Multidetector CT imaging of the chest was performed following the standard protocol without IV contrast. Intravenous contrast was not administered due to the patient's chronic kidney disease. COMPARISON:  No prior CT.  Chest x-rays 05/05/2015 and earlier. FINDINGS: Best seen on coronal reformatted images is abrupt occlusion of the right upper lobe bronchus and the right bronchus intermedius centrally. As a result, there is dense consolidation in the entire right lung with a areas of hypoattenuation giving a "drowned lung" appearance. Without intravenous contrast, it is difficult to visualize the large obstructing mass as it has similar attenuation to the consolidated lung. There is an associated small to moderate sized right pleural effusion. No pulmonary parenchymal nodules or masses involving the left lung. Linear scarring involving the medial left lower lobe. No confluent airspace consolidation in the left lung. No left pleural effusion. Apparent pleural thickening  throughout the left hemithorax is related to abundant subpleural fat. Bulky right paratracheal conglomerate lymphadenopathy in station 2R and station 4R measuring approximately 5.6 x 5.0 x 7.4 cm. Enlarged lymph nodes elsewhere in the right side of the mediastinum. Enlarged right retroclavicular lymph nodes, the largest measuring approximately 2.8 x 3.8 x 3.1 cm. The bulky mediastinal lymphadenopathy compresses the superior vena cava, and there is a prominent right internal mammary vein collateral. Heart size upper normal. No pericardial effusion. Mild LAD and right coronary artery atherosclerosis. Moderate atherosclerosis involving the thoracic and upper abdominal aorta and their visualized branches. Approximate 3 cm cyst involving the posterior segment right lobe of liver. Within the limits of the unenhanced technique, no solid mass involving visualized liver. Approximate 1.6 x 2.6 x 2.2 cm low-attenuation mass involving the left adrenal gland. Solitary punctate calcification involving the proximal body of the pancreas. Visualized upper abdomen otherwise unremarkable for the unenhanced technique. IMPRESSION: 1. Obstructing central mass involving the right lung with occlusion of the right upper lobe bronchus and the bronchus intermedius with complete atelectasis of the right lung. The mass is difficult to visualize as it is of similar attenuation to the atelectatic lung. Bronchoscopy is recommended in further evaluation and for diagnosis. 2. Small to moderate-sized right pleural effusion. 3. Metastatic mediastinal lymphadenopathy as detailed above, the largest conglomerate nodal mass in the right paratracheal region. 4. Mild compression of the superior vena cava with a prominent right internal mammary vein collateral. 5. Approximate 3 cm cyst involving the posterior segment right lobe of the visualized liver. 6. Approximate 2.6 cm low-attenuation mass involving the left adrenal gland, likely adenoma. Electronically  Signed   By: Evangeline Dakin M.D.   On: 05/20/2015 12:41   US Renal  05/21/2015  CLINICAL DATA:  Acute renal failure EXAM: RENAL / URINARY TRACT ULTRASOUND COMPLETE COMPARISON:  None. FINDINGS: Right Kidney: Length: 11 cm. Echogenicity within normal limits. No mass or hydronephrosis visualized. There is  cortical thinning probable due to atrophy Left Kidney: Length: 12.2 cm. Cortical thinning is noted probable due to atrophy. Echogenicity within normal limits. No mass or hydronephrosis visualized. Bladder: The urinary bladder is decompressed with Foley catheter. IMPRESSION: 1. No hydronephrosis. No renal calculi. Bilateral cortical thinning probable due to atrophy. Decompressed urinary bladder with Foley catheter. Electronically Signed   By: Lahoma Crocker M.D.   On: 05/21/2015 09:25   Dg Chest Port 1 View  05/21/2015  CLINICAL DATA:  Respiratory failure, intubated EXAM: PORTABLE CHEST 1 VIEW COMPARISON:  05/20/2015 FINDINGS: Cardiomegaly again noted. Stable endotracheal and NG tube position. Again noted almost complete opacification of the right hemi thorax. Chronic elevation of the right hemidiaphragm. Mild left basilar atelectasis. No convincing pulmonary edema. No pneumothorax. Atherosclerotic calcifications of thoracic aorta again noted. IMPRESSION: Stable endotracheal and NG tube position. Again noted almost complete opacification of the right hemi thorax. Chronic elevation of the right hemidiaphragm. Mild left basilar atelectasis. No convincing pulmonary edema. No pneumothorax. Atherosclerotic calcifications of thoracic aorta again noted. Electronically Signed   By: Lahoma Crocker M.D.   On: 05/21/2015 08:22   Dg Chest Port 1 View  05/20/2015  CLINICAL DATA:  Acute respiratory failure with hypoxia. Status post bronchoscopy and line placement. EXAM: PORTABLE CHEST 1 VIEW COMPARISON:  05/20/2015 at 1302 hours FINDINGS: Following bronchoscopy, there is now partial aeration of the right lung noted in the  right upper to mid lung. There is also some aeration now noted in the right lower lung with a band of opacity noted across the mid lung bordering the minor fissure. Left lung is hyperexpanded but essentially clear. No pneumothorax. Endotracheal tube is stable with its tip 4.7 cm above the carina. Orogastric tube passes below the diaphragm. IMPRESSION: 1. Improved right lung aeration following bronchoscopy. No pneumothorax. 2. No other change. Electronically Signed   By: Lajean Manes M.D.   On: 05/20/2015 17:42   Dg Chest Port 1 View  05/20/2015  CLINICAL DATA:  Hypoxia EXAM: PORTABLE CHEST 1 VIEW COMPARISON:  Chest CT obtained earlier in the day; chest radiograph May 19, 2015 FINDINGS: Endotracheal tube tip is 4.6 cm above the carina. Nasogastric tube tip and side port are below the diaphragm. No pneumothorax. There is now complete opacification of the right hemi thorax, felt to be due to a combination of effusion and consolidation. Left lung is clear. Heart size is within normal limits. There is atherosclerotic change in the aorta. Mass lesions seen on CT are obscured on the right by the diffuse consolidation and effusion. IMPRESSION: Tube positions as described without pneumothorax. Left lung clear. Complete opacification on the right. Cardiac silhouette within normal limits. Atherosclerotic calcification noted. Electronically Signed   By: Lowella Grip III M.D.   On: 05/20/2015 13:18   Dg Chest Portable 1 View  05/13/2015  CLINICAL DATA:  69 year old female with increasing shortness of breath Coll the today. EXAM: PORTABLE CHEST 1 VIEW COMPARISON:  Chest x-ray 04/24/2015. FINDINGS: Enlarging right pleural effusion. Extensive opacities throughout the right mid to lower lung, which may simply reflect passive atelectasis, however, underlying airspace consolidation or underlying mass is not excluded. Persistent prominence of right paratracheal soft tissue highly concerning for lymphadenopathy. Left  lung appears relatively clear, although the left base is poorly visualized medially (likely technique related). No evidence of pulmonary edema. Heart size is normal. Atherosclerotic calcifications in the thoracic aorta. IMPRESSION: 1. Overall, there has been significant progression compared to the prior chest x-ray from 04/24/2015, with enlarging  now a large right pleural effusion and worsening aeration throughout the right lung. This is unusual in the setting of a treated pneumonia, and given the presence of paratracheal soft tissue thickening on the right which is suspicious for lymphadenopathy, underlying neoplasm is strongly suspected. Further evaluation with contrast enhanced chest CT in is strongly recommended in the near future to evaluate for underlying neoplasm. 2. Atherosclerosis. Electronically Signed   By: Vinnie Langton M.D.   On: 05/10/2015 01:27   Dg Chest Portable 1 View  04/24/2015  CLINICAL DATA:  Return productive cough for, shortness of breath for the last 2 weeks. EXAM: PORTABLE CHEST 1 VIEW COMPARISON:  July 14, 2009 FINDINGS: The mediastinal contour is stable. The heart size is enlarged. There is right upper lobe pneumonia. There is also consolidation of right lung base, pneumonia is not excluded. The left lung is clear. The bony structures are stable. IMPRESSION: Right upper lobe pneumonia. In addition, there is consolidation right lung base, pneumonia is not excluded. Electronically Signed   By: Abelardo Diesel M.D.   On: 04/24/2015 18:02   Dg Abd Portable 1v  05/20/2015  CLINICAL DATA:  OG an indentation. EXAM: PORTABLE ABDOMEN - 1 VIEW COMPARISON:  None. FINDINGS: Enteric tube tip is adequately positioned in the body of the stomach. Mildly prominent gas-filled loops of small bowel are noted within the right lower quadrant. Overall bowel gas pattern is indeterminate. No evidence of free intraperitoneal air seen. Large dense opacity noted at the right lung base. IMPRESSION: 1. OG  tube appears adequately positioned with tip in the region of the stomach body. 2. Large dense opacity at the right lung base, incompletely imaged, which could be consolidation or effusion. Electronically Signed   By: Franki Cabot M.D.   On: 05/20/2015 13:19    ASSESSMENT:  Small cell lung cancer.   PLAN:    1. Small cell lung cancer: Patient is likely stage IV given her suspicious adrenal lesion, but full staging workup cannot be completed given her acute illness. After lengthy discussion pulmonology, extubation will be difficult given the external compression of the mass on her bronchus. Patient expressed understanding of the acuteness of her illness and the need to do chemotherapy even if she remains intubated.  Will plan to give chemotherapy with cisplatin and etoposide on Monday, May 26, 2015 whether the patient is extubated or not. I suspect extubation will not be possible until there is some decreased size of her mass.  XRT would be helpful, but there are patient safety concerns transporting to radiation oncology.  Patient will receive cisplatin and etoposide on day 1 and then etoposide only on days 2 and 3. She will require central venous access for treatment either via PICC line or venous catheter prior to initiating chemotherapy. She will need port placement long-term.    Will follow.   Lloyd Huger, MD   05/23/2015 3:31 PM

## 2015-05-23 NOTE — Progress Notes (Signed)
ANTIBIOTIC CONSULT NOTE - FOLLOW UP  Pharmacy Consult for vancomycin  Indication: pneumonia  Allergies  Allergen Reactions  . Atorvastatin Other (See Comments)    Does not remember why she had Intolerance to Lipitor.  . Fentanyl Itching  . Rosuvastatin Anxiety    Chest tigtness and feeling as if she had heartburn.    Patient Measurements: Height: '5\' 7"'$  (170.2 cm) Weight: 232 lb 12.9 oz (105.6 kg) IBW/kg (Calculated) : 61.6  Vital Signs: Temp: 98.4 F (36.9 C) (10/28 1900) Temp Source: Oral (10/28 1900) BP: 150/80 mmHg (10/28 2300) Pulse Rate: 80 (10/28 2300) Intake/Output from previous day: 10/27 0701 - 10/28 0700 In: 6705.2 [I.V.:4005.2; NG/GT:1200; IV Piggyback:1500] Out: 2780 [Urine:2780] Intake/Output from this shift: Total I/O In: 509.4 [I.V.:309.4; NG/GT:200] Out: -   Labs:  Recent Labs  05/21/15 0450 05/22/15 0407 05/23/15 0551  WBC 5.6 4.5 3.3*  HGB 12.5 12.4 13.4  PLT 84* 72* 74*  CREATININE 1.91* 1.59* 1.30*   Estimated Creatinine Clearance: 52.5 mL/min (by C-G formula based on Cr of 1.3).  Recent Labs  05/23/15 2100  [facility] 61     Microbiology: Recent Results (from the past 720 hour(s))  Blood culture (routine x 2)     Status: None   Collection Time: 04/24/15  6:31 PM  Result Value Ref Range Status   Specimen Description BLOOD LEFT ASSIST CONTROL  Final   Special Requests BOTTLES DRAWN AEROBIC AND ANAEROBIC  4CC  Final   Culture NO GROWTH 6 DAYS  Final   Report Status 04/30/2015 FINAL  Final  Blood culture (routine x 2)     Status: None   Collection Time: 04/24/15  6:31 PM  Result Value Ref Range Status   Specimen Description BLOOD LEFT HAND  Final   Special Requests BOTTLES DRAWN AEROBIC AND ANAEROBIC  2CC  Final   Culture NO GROWTH 6 DAYS  Final   Report Status 04/30/2015 FINAL  Final  MRSA PCR Screening     Status: None   Collection Time: 05/03/2015 12:28 AM  Result Value Ref Range Status   MRSA by PCR NEGATIVE NEGATIVE Final     Comment:        The GeneXpert MRSA Assay (FDA approved for NASAL specimens only), is one component of a comprehensive MRSA colonization surveillance program. It is not intended to diagnose MRSA infection nor to guide or monitor treatment for MRSA infections.   Culture, blood (routine x 2)     Status: None (Preliminary result)   Collection Time: 05/02/2015  1:13 AM  Result Value Ref Range Status   Specimen Description BLOOD RIGHT HAND  Final   Special Requests BOTTLES DRAWN AEROBIC AND ANAEROBIC 3CC  Final   Culture NO GROWTH 3 DAYS  Final   Report Status PENDING  Incomplete  Culture, blood (routine x 2)     Status: None (Preliminary result)   Collection Time: 05/23/2015  1:13 AM  Result Value Ref Range Status   Specimen Description BLOOD RIGHT ASSIST CONTROL  Final   Special Requests BOTTLES DRAWN AEROBIC AND ANAEROBIC 4CC  Final   Culture NO GROWTH 3 DAYS  Final   Report Status PENDING  Incomplete  Culture, expectorated sputum-assessment     Status: None   Collection Time: 05/20/15  2:00 PM  Result Value Ref Range Status   Specimen Description EXPECTORATED SPUTUM  Final   Special Requests NONE  Final   Sputum evaluation THIS SPECIMEN IS ACCEPTABLE FOR SPUTUM CULTURE  Final   Report  Status 05/20/2015 FINAL  Final  Culture, respiratory (NON-Expectorated)     Status: None   Collection Time: 05/20/15  2:00 PM  Result Value Ref Range Status   Specimen Description EXPECTORATED SPUTUM  Final   Special Requests NONE Reflexed from T3502  Final   Gram Stain   Final    FEW WBC SEEN FEW GRAM POSITIVE RODS FAIR SPECIMEN - 70-80% WBCS    Culture APPEARS TO BE NORMAL FLORA  Final   Report Status 05/22/2015 FINAL  Final  Culture, bal-quantitative     Status: None   Collection Time: 05/20/15  5:05 PM  Result Value Ref Range Status   Specimen Description BRONCHIAL ALVEOLAR LAVAGE  Final   Special Requests Normal  Final   Gram Stain   Final    MODERATE WBC SEEN RARE GRAM POSITIVE  RODS RARE GRAM POSITIVE COCCI IN CHAINS GOOD SPECIMEN - 80-90% WBCS    Culture APPEARS TO BE NORMAL FLORA  Final   Report Status 05/22/2015 FINAL  Final  Fungus Culture with Smear     Status: None (Preliminary result)   Collection Time: 05/20/15  5:05 PM  Result Value Ref Range Status   Specimen Description SPUTUM  Final   Special Requests Normal  Final   Fungal Smear PENDING  Incomplete   Culture NO FUNGUS ISOLATED AFTER 2 DAYS  Final   Report Status PENDING  Incomplete    Anti-infectives    Start     Dose/Rate Route Frequency Ordered Stop   05/23/15 2000  levofloxacin (LEVAQUIN) tablet 500 mg     500 mg Oral Daily 05/23/15 1342     05/20/15 2130  vancomycin (VANCOCIN) IVPB 1000 mg/200 mL premix  Status:  Discontinued     1,000 mg 200 mL/hr over 60 Minutes Intravenous Every 24 hours 05/20/15 1248 05/23/15 1342   05/20/15 1800  azithromycin (ZITHROMAX) 500 mg in dextrose 5 % 250 mL IVPB  Status:  Discontinued     500 mg 250 mL/hr over 60 Minutes Intravenous Every 24 hours 05/20/15 0957 05/23/15 1429   05/21/2015 2130  vancomycin (VANCOCIN) 1,250 mg in sodium chloride 0.9 % 250 mL IVPB  Status:  Discontinued     1,250 mg 166.7 mL/hr over 90 Minutes Intravenous Every 24 hours 05/26/2015 1221 05/20/15 1248   05/03/2015 1430  azithromycin (ZITHROMAX) 500 mg in dextrose 5 % 250 mL IVPB  Status:  Discontinued     500 mg 250 mL/hr over 60 Minutes Intravenous Every 24 hours 05/25/2015 1418 05/20/15 0957   04/29/2015 1400  piperacillin-tazobactam (ZOSYN) IVPB 3.375 g  Status:  Discontinued     3.375 g 12.5 mL/hr over 240 Minutes Intravenous 3 times per day 05/04/2015 1221 05/23/15 1342   05/06/2015 1230  vancomycin (VANCOCIN) 1,250 mg in sodium chloride 0.9 % 250 mL IVPB     1,250 mg 166.7 mL/hr over 90 Minutes Intravenous STAT 05/15/2015 1221 05/11/2015 1431     Assessment: Pharmacy dosing vancomycin in this 69 year old female with acute respiratory failure due to COPD and CHF as well as possible  pneumonia.   Vancomycin trough drawn appropriately and is therapeutic at 10 mcg/mL on current regimen on 1g IV q24 hours.  Goal of Therapy:  Vancomycin trough level 15-20 mcg/ml  Plan:  Patient is at risk for accumulation, renal function is also improving. Will continue with current regimen of vancomycin 1g IV 24 hours and continue to monitor.  Darylene Price Pradeep Beaubrun 05/23/2015,11:10 PM

## 2015-05-23 NOTE — Progress Notes (Signed)
Nutrition Follow-up     INTERVENTION:   EN: recommend continuing current TF goal rate of 50 ml/hr, continue to assess   NUTRITION DIAGNOSIS:   Inadequate oral intake related to acute illness as evidenced by NPO status.   GOAL:   Provide needs based on ASPEN/SCCM guidelines  MONITOR:    (Energy Intake, Anthropometrics, Electrolyte/Renal Profile, Digestive System, Electrolyte/Renal Profile)  REASON FOR ASSESSMENT:   Consult Enteral/tube feeding initiation and management  ASSESSMENT:    Pt remains on vent   Diet Order:  Diet NPO time specified   EN: tolerating Vital High Protein at rate of 50 ml/hr  Digestive System: no signs of TF intolerance, residuals 150 ml, +loose BM  Electrolyte and Renal Profile:  Recent Labs Lab 05/21/15 0450 05/22/15 0407 05/23/15 0551  BUN 45* 49* 49*  CREATININE 1.91* 1.59* 1.30*  NA 139 138 142  K 4.0 3.4* 3.8  MG  --   --  1.9  PHOS  --   --  4.1   Glucose Profile:  Recent Labs  05/23/15 0529 05/23/15 0730 05/23/15 1129  GLUCAP 187* 175* 167*   Meds: precedex, diprivan off at present  Height:   Ht Readings from Last 1 Encounters:  04/30/2015 '5\' 7"'$  (1.702 m)    Weight:   Wt Readings from Last 1 Encounters:  05/23/15 232 lb 12.9 oz (105.6 kg)    Filed Weights   05/05/2015 0900 05/21/15 0520 05/23/15 0500  Weight: 238 lb 1.6 oz (108 kg) 232 lb 12.9 oz (105.6 kg) 232 lb 12.9 oz (105.6 kg)    BMI:  Body mass index is 36.45 kg/(m^2).  Estimated Nutritional Needs:   Kcal:  4076-8088 kcals (11-14 kcals/kg) using current wt of 105.6 kg  Protein:  92-122 g (1.5-2.0 g/kg)   Fluid:  1525-1830 mL (25-30 ml/kg)   EDUCATION NEEDS:   No education needs identified at this time  [place], [place], LDN [phone] Pager

## 2015-05-23 NOTE — Progress Notes (Addendum)
RT decreased pt SBT settings to 8/5 per verbal order from Dr. Stevenson Clinch in morning rounds.  Dr. Stevenson Clinch ordered to leave on SBT 10/7 until after lunch, then decrease to 8/5

## 2015-05-23 NOTE — Progress Notes (Signed)
   Rn calling  1. cOPD patient on PSV - wants to plan for the night -> advised to rest back on full support  2. Diarrhea   - chagne ppi to pepcid - dc laxaative - c diff testing if diarrhea persist despite above  Dr. Brand Males, M.D., [facility].C.P Pulmonary and Critical Care Medicine Staff Physician [place] [facility] Pager: [phone], If no answer or between  15:00h - 7:00h: call [phone]  05/23/2015 7:02 PM

## 2015-05-24 DIAGNOSIS — C801 Malignant (primary) neoplasm, unspecified: Secondary | ICD-10-CM

## 2015-05-24 DIAGNOSIS — J9622 Acute and chronic respiratory failure with hypercapnia: Secondary | ICD-10-CM

## 2015-05-24 LAB — BASIC METABOLIC PANEL
Anion gap: 9 (ref 5–15)
BUN: 52 mg/dL — AB (ref 6–20)
CHLORIDE: 108 mmol/L (ref 101–111)
CO2: 31 mmol/L (ref 22–32)
Calcium: 8.4 mg/dL — ABNORMAL LOW (ref 8.9–10.3)
Creatinine, Ser: 1.24 mg/dL — ABNORMAL HIGH (ref 0.44–1.00)
GFR calc Af Amer: 51 mL/min — ABNORMAL LOW (ref >60)
GFR calc non Af Amer: 44 mL/min — ABNORMAL LOW (ref >60)
Glucose, Bld: 208 mg/dL — ABNORMAL HIGH (ref 65–99)
POTASSIUM: 4.6 mmol/L (ref 3.5–5.1)
SODIUM: 148 mmol/L — AB (ref 135–145)

## 2015-05-24 LAB — CBC
HEMATOCRIT: 41.9 % (ref 35.0–47.0)
HEMOGLOBIN: 13.7 g/dL (ref 12.0–16.0)
MCH: 29.9 pg (ref 26.0–34.0)
MCHC: 32.7 g/dL (ref 32.0–36.0)
MCV: 91.5 fL (ref 80.0–100.0)
Platelets: 66 10*3/uL — ABNORMAL LOW (ref 150–440)
RBC: 4.58 MIL/uL (ref 3.80–5.20)
RDW: 17.8 % — ABNORMAL HIGH (ref 11.5–14.5)
WBC: 3.4 10*3/uL — ABNORMAL LOW (ref 3.6–11.0)

## 2015-05-24 LAB — CULTURE, BLOOD (ROUTINE X 2)
CULTURE: NO GROWTH
CULTURE: NO GROWTH

## 2015-05-24 LAB — GLUCOSE, CAPILLARY
Glucose-Capillary: 188 mg/dL — ABNORMAL HIGH (ref 65–99)
Glucose-Capillary: 191 mg/dL — ABNORMAL HIGH (ref 65–99)
Glucose-Capillary: 193 mg/dL — ABNORMAL HIGH (ref 65–99)
Glucose-Capillary: 209 mg/dL — ABNORMAL HIGH (ref 65–99)
Glucose-Capillary: 228 mg/dL — ABNORMAL HIGH (ref 65–99)
Glucose-Capillary: 236 mg/dL — ABNORMAL HIGH (ref 65–99)

## 2015-05-24 MED ORDER — COLCHICINE 0.6 MG PO TABS
0.6000 mg | ORAL_TABLET | Freq: Every day | ORAL | Status: DC
  Administered 2015-05-25 – 2015-05-30 (×6): 0.6 mg
  Filled 2015-05-24 (×6): qty 1

## 2015-05-24 MED ORDER — FREE WATER
200.0000 mL | Status: DC
  Administered 2015-05-24 – 2015-05-26 (×12): 200 mL

## 2015-05-24 MED ORDER — METHYLPREDNISOLONE SODIUM SUCC 40 MG IJ SOLR
40.0000 mg | Freq: Two times a day (BID) | INTRAMUSCULAR | Status: DC
  Administered 2015-05-24 – 2015-05-26 (×4): 40 mg via INTRAVENOUS
  Filled 2015-05-24 (×4): qty 1

## 2015-05-24 MED ORDER — PREGABALIN 25 MG PO CAPS
50.0000 mg | ORAL_CAPSULE | Freq: Three times a day (TID) | ORAL | Status: DC
  Administered 2015-05-24 – 2015-06-07 (×42): 50 mg
  Filled 2015-05-24 (×42): qty 2

## 2015-05-24 MED ORDER — FAMOTIDINE 40 MG/5ML PO SUSR
20.0000 mg | Freq: Two times a day (BID) | ORAL | Status: DC
  Administered 2015-05-24: 20 mg
  Filled 2015-05-24 (×4): qty 2.5

## 2015-05-24 MED ORDER — LORAZEPAM 2 MG/ML IJ SOLN
0.5000 mg | Freq: Four times a day (QID) | INTRAMUSCULAR | Status: DC | PRN
  Administered 2015-05-24: 0.5 mg via INTRAVENOUS
  Filled 2015-05-24: qty 1

## 2015-05-24 MED ORDER — INSULIN ASPART 100 UNIT/ML ~~LOC~~ SOLN
0.0000 [IU] | SUBCUTANEOUS | Status: DC
  Administered 2015-05-24: 3 [IU] via SUBCUTANEOUS
  Administered 2015-05-24: 5 [IU] via SUBCUTANEOUS
  Administered 2015-05-24: 3 [IU] via SUBCUTANEOUS
  Administered 2015-05-25: 5 [IU] via SUBCUTANEOUS
  Administered 2015-05-25: 3 [IU] via SUBCUTANEOUS
  Administered 2015-05-25: 5 [IU] via SUBCUTANEOUS
  Administered 2015-05-25 (×2): 3 [IU] via SUBCUTANEOUS
  Administered 2015-05-25 – 2015-05-26 (×2): 5 [IU] via SUBCUTANEOUS
  Administered 2015-05-26: 8 [IU] via SUBCUTANEOUS
  Administered 2015-05-26 (×3): 5 [IU] via SUBCUTANEOUS
  Administered 2015-05-26: 8 [IU] via SUBCUTANEOUS
  Administered 2015-05-26: 3 [IU] via SUBCUTANEOUS
  Administered 2015-05-27: 5 [IU] via SUBCUTANEOUS
  Administered 2015-05-27: 3 [IU] via SUBCUTANEOUS
  Administered 2015-05-27 (×2): 5 [IU] via SUBCUTANEOUS
  Administered 2015-05-27: 3 [IU] via SUBCUTANEOUS
  Administered 2015-05-28: 5 [IU] via SUBCUTANEOUS
  Administered 2015-05-28 (×3): 3 [IU] via SUBCUTANEOUS
  Administered 2015-05-28: 5 [IU] via SUBCUTANEOUS
  Administered 2015-05-28 – 2015-05-29 (×6): 3 [IU] via SUBCUTANEOUS
  Administered 2015-05-31 – 2015-06-04 (×7): 2 [IU] via SUBCUTANEOUS
  Administered 2015-06-04 (×2): 3 [IU] via SUBCUTANEOUS
  Administered 2015-06-04 – 2015-06-05 (×2): 2 [IU] via SUBCUTANEOUS
  Administered 2015-06-05: 3 [IU] via SUBCUTANEOUS
  Administered 2015-06-05: 2 [IU] via SUBCUTANEOUS
  Administered 2015-06-05: 3 [IU] via SUBCUTANEOUS
  Administered 2015-06-05: 2 [IU] via SUBCUTANEOUS
  Administered 2015-06-05: 3 [IU] via SUBCUTANEOUS
  Administered 2015-06-06 (×2): 2 [IU] via SUBCUTANEOUS
  Administered 2015-06-06 – 2015-06-07 (×2): 3 [IU] via SUBCUTANEOUS
  Filled 2015-05-24: qty 5
  Filled 2015-05-24: qty 2
  Filled 2015-05-24: qty 5
  Filled 2015-05-24: qty 3
  Filled 2015-05-24: qty 2
  Filled 2015-05-24: qty 3
  Filled 2015-05-24: qty 8
  Filled 2015-05-24: qty 3
  Filled 2015-05-24: qty 2
  Filled 2015-05-24: qty 5
  Filled 2015-05-24: qty 1
  Filled 2015-05-24: qty 3
  Filled 2015-05-24: qty 2
  Filled 2015-05-24 (×2): qty 3
  Filled 2015-05-24: qty 2
  Filled 2015-05-24: qty 5
  Filled 2015-05-24 (×2): qty 2
  Filled 2015-05-24 (×2): qty 3
  Filled 2015-05-24: qty 5
  Filled 2015-05-24: qty 2
  Filled 2015-05-24: qty 1
  Filled 2015-05-24: qty 3
  Filled 2015-05-24: qty 1
  Filled 2015-05-24: qty 15
  Filled 2015-05-24: qty 8
  Filled 2015-05-24: qty 5
  Filled 2015-05-24: qty 3
  Filled 2015-05-24: qty 2
  Filled 2015-05-24: qty 3
  Filled 2015-05-24: qty 8
  Filled 2015-05-24: qty 3
  Filled 2015-05-24: qty 5
  Filled 2015-05-24: qty 2
  Filled 2015-05-24: qty 3
  Filled 2015-05-24 (×2): qty 5
  Filled 2015-05-24: qty 3
  Filled 2015-05-24: qty 1
  Filled 2015-05-24: qty 2
  Filled 2015-05-24 (×2): qty 3
  Filled 2015-05-24: qty 15
  Filled 2015-05-24: qty 3
  Filled 2015-05-24: qty 2
  Filled 2015-05-24 (×5): qty 3
  Filled 2015-05-24: qty 2
  Filled 2015-05-24 (×2): qty 3
  Filled 2015-05-24: qty 5

## 2015-05-24 MED ORDER — MORPHINE SULFATE (PF) 2 MG/ML IV SOLN
1.0000 mg | INTRAVENOUS | Status: DC | PRN
  Administered 2015-05-24 (×2): 2 mg via INTRAVENOUS
  Filled 2015-05-24 (×2): qty 1

## 2015-05-24 MED ORDER — LINAGLIPTIN 5 MG PO TABS
5.0000 mg | ORAL_TABLET | Freq: Every day | ORAL | Status: DC
  Administered 2015-05-25 – 2015-05-28 (×4): 5 mg
  Filled 2015-05-24 (×4): qty 1

## 2015-05-24 MED ORDER — LORAZEPAM 2 MG/ML IJ SOLN
0.5000 mg | INTRAMUSCULAR | Status: DC | PRN
  Administered 2015-05-24 – 2015-05-25 (×4): 1 mg via INTRAVENOUS
  Filled 2015-05-24 (×4): qty 1

## 2015-05-24 MED ORDER — METOPROLOL TARTRATE 50 MG PO TABS
100.0000 mg | ORAL_TABLET | Freq: Two times a day (BID) | ORAL | Status: DC
  Administered 2015-05-24 – 2015-06-07 (×27): 100 mg
  Filled 2015-05-24 (×7): qty 2
  Filled 2015-05-24: qty 1
  Filled 2015-05-24 (×21): qty 2

## 2015-05-24 NOTE — Progress Notes (Signed)
Patient is arousable to voice, alert and times. Tolerating tube feedings with minimal residuals. Per hospitalist on-call, readdress PICC placement tomorrow with Dr Ether Griffins. Foley in place draining to bag. Patient with mild work of breathing, oxygen saturation 88% and greater, on-call MD aware with no new orders. Resting quietly between care.

## 2015-05-24 NOTE — Progress Notes (Signed)
[facility] Cardiology  SUBJECTIVE: Intubated   Filed Vitals:   05/24/15 0815 05/24/15 0900 05/24/15 1000 05/24/15 1100  BP:  172/86 164/85 134/75  Pulse:  76 73 74  Temp:      TempSrc:      Resp:  '20 20 18  '$ Height:      Weight:      SpO2: 94% 93% 92% 92%     Intake/Output Summary (Last 24 hours) at 05/24/15 1137 Last data filed at 05/24/15 0940  Gross per 24 hour  Intake 2447.1 ml  Output   1875 ml  Net  572.1 ml      PHYSICAL EXAM  General: Intubated  HEENT:  Normocephalic and atramatic Neck:  No JVD.  Lungs: Clear bilaterally to auscultation and percussion. Heart: HRRR . Normal S1 and S2 without gallops or murmurs.  Abdomen: Bowel sounds are positive, abdomen soft and non-tender  Msk:  Back normal, normal gait. Normal strength and tone for age. Extremities: No clubbing, cyanosis or edema.   Neuro: Alert and oriented X 3. Psych:  Good affect, responds appropriately   LABS: Basic Metabolic Panel:  Recent Labs  05/23/15 0551 05/24/15 0510  NA 142 148*  K 3.8 4.6  CL 104 108  CO2 29 31  GLUCOSE 187* 208*  BUN 49* 52*  CREATININE 1.30* 1.24*  CALCIUM 8.3* 8.4*  MG 1.9  --   PHOS 4.1  --    Liver Function Tests: No results for input(s): AST, ALT, ALKPHOS, BILITOT, PROT, ALBUMIN in the last 72 hours. No results for input(s): LIPASE, AMYLASE in the last 72 hours. CBC:  Recent Labs  05/23/15 0551 05/24/15 0510  WBC 3.3* 3.4*  HGB 13.4 13.7  HCT 41.3 41.9  MCV 91.2 91.5  PLT 74* 66*   Cardiac Enzymes: No results for input(s): CKTOTAL, CKMB, CKMBINDEX, TROPONINI in the last 72 hours. BNP: Invalid input(s): POCBNP D-Dimer: No results for input(s): DDIMER in the last 72 hours. Hemoglobin A1C: No results for input(s): HGBA1C in the last 72 hours. Fasting Lipid Panel:  Recent Labs  05/23/15 1322  TRIG 307*   Thyroid Function Tests: No results for input(s): TSH, T4TOTAL, T3FREE, THYROIDAB in the last 72 hours.  Invalid input(s): FREET3 Anemia  Panel: No results for input(s): VITAMINB12, FOLATE, FERRITIN, TIBC, IRON, RETICCTPCT in the last 72 hours.  No results found.   Echo moderately reduced left ventricular function, LV ejection fraction 30-35%  TELEMETRY: Normal sinus rhythm:  ASSESSMENT AND PLAN:  Active Problems:   Hypercapnic respiratory failure (HCC)   Acute respiratory failure with hypercapnia (HCC)   COPD with acute exacerbation (HCC)   Acute respiratory failure with hypoxia (HCC)   Lung mass    1. Atrial arrhythmia, SVT versus paroxysmal atrial fibrillation, during bronchoscopy, remains in sinus rhythm without recurrent arrhythmias 2. Respiratory failure, multifactorial, secondary to COPD exacerbation, and lung cancer 3. Moderate cardiomyopathy, LV ejection fraction 30-35% 4. Chronic systolic congestive heart failure  Recommendations  1. Continue current therapy 2. Continue metoprolol tartrate 100 mg twice a day. Defer changing to carvedilol or metoprolol succinate during this hospitalization. 3. Defer chronic anticoagulation 4. Defer further cardiac diagnostics at this time  Sign off for now, please call if any questions   Blaike Newburn, MD, PhD, [facility] 05/24/2015 11:37 AM

## 2015-05-24 NOTE — Consult Note (Signed)
[facility] Medicine Consultation      Name: Emily Pratt MRN: [id number] DOB: 1946/05/02    ADMISSION DATE:  04/29/2015  CHIEF COMPLAINT:  Lethargic and resp distress   HISTORY OF PRESENT ILLNESS 05/17/2015  69 yo obese white female admitted to ICU for acute resp failure  The patient presents via EMS complaining of shortness of breath. She reportedly had oxygen saturations as low as 70% when EMS arrived. The patient was on BiPAP at that time. FiO2 was increased to 100% and oxygen saturations improved to only 79%. In the emergency department patient was immediately initiated on BiPAP. She had aggressively become more somnolent.  Chest x-ray showed worsening pleural effusion. Patient on biPAP fio2 at 50% now  Subjective: Patient transition to PSV this morning tolerated about 2-1/2 hours, try to lower settings on PSV, patient became tachypneic and desatted down to lower 80s.    SIGNIFICANT EVENTS  CT chest R lung mass, intubated on 10/25, bronch with biopsy, overnight with some tachycardia but spontaneously resolved.    ICU admission 10/24 10/25>>CT Chest with R lung mass >>intubated 10/25>>bronchoscopy with forceps biopsy of RMS and RUL carina (forceps and FNA)>> small cell   SUBJ: RASS -3, not F/C after lorazepam. Reportedly was F/C previously  Review of Systems  Unable to perform ROS: critical illness      VITAL SIGNS    Temp:  [97.8 F (36.6 C)-98.4 F (36.9 C)] 98 F (36.7 C) (10/29 1200) Pulse Rate:  [68-93] 84 (10/29 1600) Resp:  [13-21] 19 (10/29 1600) BP: (126-172)/(64-93) 160/79 mmHg (10/29 1600) SpO2:  [88 %-98 %] 90 % (10/29 1600) FiO2 (%):  [28 %-40 %] 28 % (10/29 1611) Weight:  [110.8 kg (244 lb 4.3 oz)] 110.8 kg (244 lb 4.3 oz) (10/29 0500) HEMODYNAMICS:   VENTILATOR SETTINGS: Vent Mode:  [-] PRVC FiO2 (%):  [28 %-40 %] 28 % Set Rate:  [14 bmp-20 bmp] 14 bmp Vt Set:  [500 mL] 500 mL PEEP:  [5 cmH20-7 cmH20] 5 cmH20 INTAKE /  OUTPUT:  Intake/Output Summary (Last 24 hours) at 05/24/15 1719 Last data filed at 05/24/15 1636  Gross per 24 hour  Intake 2889.38 ml  Output   2400 ml  Net 489.38 ml       PHYSICAL EXAM   Physical Exam  Cardiovascular: Normal rate, regular rhythm and normal heart sounds.   No murmur heard. Pulmonary/Chest: No respiratory distress. She has no wheezes.  Intubated. Shallow BS at the bases, dec BS at on the right, mild coarse upper airway sounds.   Abdominal: Soft. Bowel sounds are normal. She exhibits no distension. There is no tenderness.  Musculoskeletal: She exhibits no edema.  Skin: Skin is warm.  Nursing note and vitals reviewed.      LABS    Recent Labs Lab 05/22/15 0407 05/23/15 0551 05/24/15 0510  WBC 4.5 3.3* 3.4*  HGB 12.4 13.4 13.7  HCT 37.7 41.3 41.9  PLT 72* 74* 66*   Coag's  Recent Labs Lab 05/22/15 1217  APTT 25  INR 1.04   BMET  Recent Labs Lab 05/22/15 0407 05/23/15 0551 05/24/15 0510  NA 138 142 148*  K 3.4* 3.8 4.6  CL 102 104 108  CO2 '29 29 31  '$ BUN 49* 49* 52*  CREATININE 1.59* 1.30* 1.24*  GLUCOSE 188* 187* 208*   Electrolytes  Recent Labs Lab 05/22/15 0407 05/23/15 0551 05/24/15 0510  CALCIUM 7.9* 8.3* 8.4*  MG  --  1.9  --  PHOS  --  4.1  --    Sepsis Markers  Recent Labs Lab 05/09/2015 0113 05/03/2015 0410  LATICACIDVEN 2.3* 1.0   ABG  Recent Labs Lab 05/20/15 0935 05/20/15 1430 05/21/15 0845  PHART 7.45 7.37 7.42  PCO2ART 46 59* 52*  PO2ART 62* 62* 79*   Liver Enzymes  Recent Labs Lab 05/17/2015 0036  AST 24  ALT 13*  ALKPHOS 79  BILITOT 0.8  ALBUMIN 3.4*   Cardiac Enzymes  Recent Labs Lab 05/26/2015 0036  TROPONINI <0.03   Glucose  Recent Labs Lab 05/23/15 2003 05/23/15 2324 05/24/15 0331 05/24/15 0728 05/24/15 1157 05/24/15 1614  GLUCAP 177* 166* 191* 209* 193* 188*     Recent Results (from the past 240 hour(s))  MRSA PCR Screening     Status: None   Collection Time:  05/05/2015 12:28 AM  Result Value Ref Range Status   MRSA by PCR NEGATIVE NEGATIVE Final    Comment:        The GeneXpert MRSA Assay (FDA approved for NASAL specimens only), is one component of a comprehensive MRSA colonization surveillance program. It is not intended to diagnose MRSA infection nor to guide or monitor treatment for MRSA infections.   Culture, blood (routine x 2)     Status: None   Collection Time: 05/06/2015  1:13 AM  Result Value Ref Range Status   Specimen Description BLOOD RIGHT HAND  Final   Special Requests BOTTLES DRAWN AEROBIC AND ANAEROBIC 3CC  Final   Culture NO GROWTH 5 DAYS  Final   Report Status 05/24/2015 FINAL  Final  Culture, blood (routine x 2)     Status: None   Collection Time: 05/06/2015  1:13 AM  Result Value Ref Range Status   Specimen Description BLOOD RIGHT ASSIST CONTROL  Final   Special Requests BOTTLES DRAWN AEROBIC AND ANAEROBIC 4CC  Final   Culture NO GROWTH 5 DAYS  Final   Report Status 05/24/2015 FINAL  Final  Culture, expectorated sputum-assessment     Status: None   Collection Time: 05/20/15  2:00 PM  Result Value Ref Range Status   Specimen Description EXPECTORATED SPUTUM  Final   Special Requests NONE  Final   Sputum evaluation THIS SPECIMEN IS ACCEPTABLE FOR SPUTUM CULTURE  Final   Report Status 05/20/2015 FINAL  Final  Culture, respiratory (NON-Expectorated)     Status: None   Collection Time: 05/20/15  2:00 PM  Result Value Ref Range Status   Specimen Description EXPECTORATED SPUTUM  Final   Special Requests NONE Reflexed from T3502  Final   Gram Stain   Final    FEW WBC SEEN FEW GRAM POSITIVE RODS FAIR SPECIMEN - 70-80% WBCS    Culture APPEARS TO BE NORMAL FLORA  Final   Report Status 05/22/2015 FINAL  Final  Culture, bal-quantitative     Status: None   Collection Time: 05/20/15  5:05 PM  Result Value Ref Range Status   Specimen Description BRONCHIAL ALVEOLAR LAVAGE  Final   Special Requests Normal  Final   Gram  Stain   Final    MODERATE WBC SEEN RARE GRAM POSITIVE RODS RARE GRAM POSITIVE COCCI IN CHAINS GOOD SPECIMEN - 80-90% WBCS    Culture APPEARS TO BE NORMAL FLORA  Final   Report Status 05/22/2015 FINAL  Final  Fungus Culture with Smear     Status: None (Preliminary result)   Collection Time: 05/20/15  5:05 PM  Result Value Ref Range Status   Specimen Description SPUTUM  Final   Special Requests Normal  Final   Fungal Smear PENDING  Incomplete   Culture NO FUNGUS ISOLATED AFTER 2 DAYS  Final   Report Status PENDING  Incomplete     IMAGING   NNF  INDWELLING DEVICES:: R Fem CVL 10/25>>  MICRO DATA: MRSA PCR >>neg BAL 10/25>>  ANTIMICROBIALS:  Zosyn/vancomycin 10/24>>    ASSESSMENT/PLAN  Acute Resp Failure - ventilator dependence R lung collapse Drowned R lung New diagnosis of metastatic small cell ca of lung Thrombocytopenia - critical illness, hold heparin for now Suspected post obstructive PNA DM 2 CKD  Cont vent support - settings reviewed and/or adjusted Cont vent bundle Daily SBT if/when meets criteria Monitor BMET intermittently Monitor I/Os Correct electrolytes as indicated Monitor temp, WBC count Micro and abx as above DVT px: SQ heparin Monitor CBC intermittently Transfuse per usual ICU guidelines  Discussed with Oncology. Will work towards extubation if can be done safely. Regardless, will initiate chem and XRT Mon 10/31  CCM time: 35 mins The above time includes time spent in consultation with patient and/or family members and reviewing care plan on multidisciplinary rounds  Merton Border, MD PCCM service Mobile [phone] Pager [phone]

## 2015-05-24 NOTE — Progress Notes (Signed)
Nutrition Follow-up    INTERVENTION:   EN: recommend continuing TF at current goal rate as per order set as tolerated by pt; noted hypernatremia, free water flush frequency increased and NS discontinued by MD, will continue to assess   NUTRITION DIAGNOSIS:   Inadequate oral intake related to acute illness as evidenced by NPO status. Being addressed via TF  GOAL:   Provide needs based on ASPEN/SCCM guidelines  MONITOR:    (Energy Intake, Anthropometrics, Electrolyte/Renal Profile, Digestive System, Electrolyte/Renal Profile)  REASON FOR ASSESSMENT:   Consult Enteral/tube feeding initiation and management  ASSESSMENT:    Pt remains on vent, noted plan to start chemo on 05/26/15 regardless of whether pt is extubated or not. Pt with small cell lung cancer, likely stage IV but full staging not complete  Diet Order:  Diet NPO time specified  EN: tolerating Vital High Protein at rate of 50 ml/hr  Digestive System: no signs of TF intolerance, 50 mL residuals, liquid stool  Electrolyte and Renal Profile:  Recent Labs Lab 05/22/15 0407 05/23/15 0551 05/24/15 0510  BUN 49* 49* 52*  CREATININE 1.59* 1.30* 1.24*  NA 138 142 148*  K 3.4* 3.8 4.6  MG  --  1.9  --   PHOS  --  4.1  --    Glucose Profile:  Recent Labs  05/23/15 2324 05/24/15 0331 05/24/15 0728  GLUCAP 166* 191* 209*   Meds: NS at 75 ml/hr, precedex  Height:   Ht Readings from Last 1 Encounters:  04/27/2015 '5\' 7"'$  (1.702 m)    Weight:   Wt Readings from Last 1 Encounters:  05/24/15 244 lb 4.3 oz (110.8 kg)    BMI:  Body mass index is 38.25 kg/(m^2).  Estimated Nutritional Needs:   Kcal:  1610-9604 kcals (11-14 kcals/kg) using current wt of 105.6 kg  Protein:  92-122 g (1.5-2.0 g/kg)   Fluid:  1525-1830 mL (25-30 ml/kg)   EDUCATION NEEDS:   No education needs identified at this time  [place], [place], LDN [phone] Pager

## 2015-05-24 NOTE — Progress Notes (Signed)
[facility] [place] NAME: Emily Pratt    MR#:  [id number]  DATE OF BIRTH:  07/28/1945  SUBJECTIVE:  CHIEF COMPLAINT:  Intubated but alert, responding appropriately. Denies pain Or shortness of breath.   REVIEW OF SYSTEMS:   Unable to obtain  DRUG ALLERGIES:   Allergies  Allergen Reactions  . Atorvastatin Other (See Comments)    Does not remember why she had Intolerance to Lipitor.  . Fentanyl Itching  . Rosuvastatin Anxiety    Chest tigtness and feeling as if she had heartburn.    VITALS:  Blood pressure 152/78, pulse 79, temperature 98 F (36.7 C), temperature source Oral, resp. rate 19, height '5\' 7"'$  (1.702 m), weight 110.8 kg (244 lb 4.3 oz), SpO2 92 %.  PHYSICAL EXAMINATION:  GENERAL:  69 y.o.-year-old patient lying in the bed, intubated but alert EYES: Pupils equal, round, reactive to light and accommodation. No scleral icterus.  HEENT: Head atraumatic, normocephalic. Oropharynx and nasopharynx clear. ETT in place NECK:  Supple, no jugular venous distention. No thyroid enlargement, no tenderness.  LUNGS: Markedly diminished breath sounds on the right , Scattered wheezes and rhonchi, coarse breath sounds on the right, good air movements and no wheezes, rhonchi or crackles on the left  CARDIOVASCULAR: S1, S2 normal. No murmurs, rubs, or gallops.  ABDOMEN: Soft, nontender, nondistended. Bowel sounds present. No organomegaly or mass.  EXTREMITIES: +1 pedal edema, cyanosis, or clubbing.  NEUROLOGIC: CN 2-12 intact, strength 5/5, follows commands, answers questions PSYCHIATRIC: alert, seems oriented, SKIN: Sacral decubitus ulcer   LABORATORY PANEL:   CBC  Recent Labs Lab 05/24/15 0510  WBC 3.4*  HGB 13.7  HCT 41.9  PLT 66*   ------------------------------------------------------------------------------------------------------------------  Chemistries   Recent Labs Lab 05/16/2015 0036  05/23/15 0551  05/24/15 0510  NA 139  < > 142 148*  K 4.7  < > 3.8 4.6  CL 94*  < > 104 108  CO2 36*  < > 29 31  GLUCOSE 211*  < > 187* 208*  BUN 27*  < > 49* 52*  CREATININE 1.82*  < > 1.30* 1.24*  CALCIUM 8.9  < > 8.3* 8.4*  MG  --   --  1.9  --   AST 24  --   --   --   ALT 13*  --   --   --   ALKPHOS 79  --   --   --   BILITOT 0.8  --   --   --   < > = values in this interval not displayed. ------------------------------------------------------------------------------------------------------------------  Cardiac Enzymes  Recent Labs Lab 05/01/2015 0036  TROPONINI <0.03   ------------------------------------------------------------------------------------------------------------------  RADIOLOGY:  No results found.  EKG:   Orders placed or performed during the hospital encounter of 05/22/2015  . EKG 12-Lead  . EKG 12-Lead    ASSESSMENT AND PLAN:   1. Acute on chronic respiratory failure with hypoxia and Hypercapnia,  - Multifactorial due to large right-sided pleural effusion/ CHF/ COPD / mass SCLC - Pulmonology following - Continue nebulizer, steroids, antibiotics. Oncology is planning chemotherapy on Monday with cisplatin and etoposide.   2. Acute Congestive heart failure: Mixed systolic and diastolic  - Echo 29/92 shows ejection fraction 30 -35% with abnormal filling - Continue metoprolol per cardiology's recommendations   3. Acute on chronic kidney disease:  - Due to ATN, hypovolemia and hypotension - Continues to improve - Nephrology signing off  4. Essential hypertension:  -  Stable, resume metoprolol and hold Cozaar  5. Diabetes mellitus type 2: Hold home medications Provide sliding scale insulin, blood glucose is ranging around 200  6. Lung mass: Small cell lung cancer, likely stage IV per oncology - Status post bronchoscopy and 10/25, pathology show small cell lung cancer - Long-term history of heavy smoking - pathology reports . Discussed with the patient and  sister . - Oncology following, and collagenase is initiating chemotherapy on Monday, we'll place PICC line for chemotherapy- Radiation oncology has been consulted and patient may benefit from radiology oncology intervention as well, although since she is intubated. It might be difficult to perform  7. Postobstructive pneumonia - Blood cultures negative to date, respiratory, BAL, AFB all pending - Discontinue vancomycin and Zosyn, continue Levaquin 8. Arrhythmia, SVT versus paroxysmal atrial fibrillation during bronchoscopy, now in sinus rhythm, continue metoprolol per cardiology's recommendations  All the records are reviewed and case discussed with the patient's husband and sister at the bedside.  CODE STATUS: Full code  TOTAL CRITICAL CARE TIME TAKING CARE OF THIS PATIENT: 35 minutes.   POSSIBLE D/C IN ?  DAYS, DEPENDING ON CLINICAL CONDITION.   Theodoro Grist M.D on 05/24/2015 at 2:29 PM  Between 7am to 6pm - Pager - [phone] After 6pm go to [email or web address] - password [email or web address] [facility]  Office  [phone]  CC: Primary care physician; Dagoberto Ligas, MD

## 2015-05-24 NOTE — Progress Notes (Signed)
Pharmacy Consult for Electrolyte Monitoring   Allergies  Allergen Reactions  . Atorvastatin Other (See Comments)    Does not remember why she had Intolerance to Lipitor.  . Fentanyl Itching  . Rosuvastatin Anxiety    Chest tigtness and feeling as if she had heartburn.    Patient Measurements: Height: '5\' 7"'$  (170.2 cm) Weight: 244 lb 4.3 oz (110.8 kg) IBW/kg (Calculated) : 61.6   Vital Signs: Temp: 98.4 F (36.9 C) (10/29 0500) Temp Source: Oral (10/29 0500) BP: 166/84 mmHg (10/29 0500) Pulse Rate: 73 (10/29 0500) Intake/Output from previous day: 10/28 0701 - 10/29 0700 In: 2701.2 [I.V.:1651.2; NG/GT:1000; IV Piggyback:50] Out: 460 [Urine:460] Intake/Output from this shift: Total I/O In: 792.5 [I.V.:592.5; NG/GT:200] Out: -   Labs:  Recent Labs  05/22/15 0407 05/22/15 1217 05/23/15 0551 05/24/15 0510  WBC 4.5  --  3.3* 3.4*  HGB 12.4  --  13.4 13.7  HCT 37.7  --  41.3 41.9  PLT 72*  --  74* 66*  APTT  --  25  --   --   INR  --  1.04  --   --      Recent Labs  05/22/15 0407 05/23/15 0551 05/23/15 1322 05/24/15 0510  NA 138 142  --  148*  K 3.4* 3.8  --  4.6  CL 102 104  --  108  CO2 29 29  --  31  GLUCOSE 188* 187*  --  208*  BUN 49* 49*  --  52*  CREATININE 1.59* 1.30*  --  1.24*  CALCIUM 7.9* 8.3*  --  8.4*  MG  --  1.9  --   --   PHOS  --  4.1  --   --   TRIG  --   --  307*  --    Estimated Creatinine Clearance: 56.5 mL/min (by C-G formula based on Cr of 1.24).    Recent Labs  05/23/15 2003 05/23/15 2324 05/24/15 0331  GLUCAP 177* 166* 191*    Medical History: Past Medical History  Diagnosis Date  . Anxiety   . Arthritis   . COPD (chronic obstructive pulmonary disease) ([place])   . CHF (congestive heart failure) ([place])   . Hyperlipidemia   . Hypertension   . Diabetes mellitus without complication ([place])   . Chronic kidney disease   . Neuromuscular disorder (HCC)     Medications:  Scheduled:  . antiseptic oral rinse  7 mL Mouth  Rinse QID  . budesonide (PULMICORT) nebulizer solution  0.5 mg Nebulization BID  . chlorhexidine gluconate  15 mL Mouth Rinse BID  . colchicine  0.6 mg Oral Daily  . famotidine  20 mg Oral QHS  . free water  200 mL Per Tube 3 times per day  . insulin aspart  0-9 Units Subcutaneous 6 times per day  . ipratropium-albuterol  3 mL Nebulization Q6H  . levofloxacin  500 mg Oral Daily  . linagliptin  5 mg Oral Daily  . losartan  100 mg Oral Daily  . methylPREDNISolone (SOLU-MEDROL) injection  60 mg Intravenous 3 times per day  . metoprolol  100 mg Oral BID  . midazolam  2 mg Intravenous Once  . pravastatin  10 mg Oral Daily  . pregabalin  50 mg Oral TID   Infusions:  . sodium chloride 75 mL/hr at 05/24/15 0302  . dexmedetomidine 0.8 mcg/kg/hr (05/24/15 0302)  . feeding supplement (VITAL HIGH PROTEIN) 1,000 mL (05/24/15 0302)  . propofol (DIPRIVAN) infusion  Stopped (05/22/15 1333)    Assessment: Na 148, K 4.6. No magnesium or phosphorus.   Plan:  No supplements, will reorder levels with AM labs.  Laural Benes, Pharm.D.  Clinical Pharmacist 05/24/2015,6:07 AM

## 2015-05-25 ENCOUNTER — Inpatient Hospital Stay: Payer: Medicare Other

## 2015-05-25 DIAGNOSIS — C3401 Malignant neoplasm of right main bronchus: Secondary | ICD-10-CM

## 2015-05-25 LAB — GLUCOSE, CAPILLARY
Glucose-Capillary: 157 mg/dL — ABNORMAL HIGH (ref 65–99)
Glucose-Capillary: 204 mg/dL — ABNORMAL HIGH (ref 65–99)
Glucose-Capillary: 199 mg/dL — ABNORMAL HIGH (ref 65–99)
Glucose-Capillary: 191 mg/dL — ABNORMAL HIGH (ref 65–99)
Glucose-Capillary: 223 mg/dL — ABNORMAL HIGH (ref 65–99)

## 2015-05-25 LAB — PHOSPHORUS: Phosphorus: 3.7 mg/dL (ref 2.5–4.6)

## 2015-05-25 LAB — COMPREHENSIVE METABOLIC PANEL
ALBUMIN: 2.7 g/dL — AB (ref 3.5–5.0)
ALT: 30 U/L (ref 14–54)
ANION GAP: 8 (ref 5–15)
AST: 33 U/L (ref 15–41)
Alkaline Phosphatase: 42 U/L (ref 38–126)
BILIRUBIN TOTAL: 0.6 mg/dL (ref 0.3–1.2)
BUN: 58 mg/dL — ABNORMAL HIGH (ref 6–20)
CO2: 29 mmol/L (ref 22–32)
Calcium: 8.5 mg/dL — ABNORMAL LOW (ref 8.9–10.3)
Chloride: 110 mmol/L (ref 101–111)
Creatinine, Ser: 1.07 mg/dL — ABNORMAL HIGH (ref 0.44–1.00)
GFR calc Af Amer: >60 mL/min (ref >60)
GFR calc non Af Amer: 52 mL/min — ABNORMAL LOW (ref >60)
GLUCOSE: 164 mg/dL — AB (ref 65–99)
POTASSIUM: 4.1 mmol/L (ref 3.5–5.1)
Sodium: 147 mmol/L — ABNORMAL HIGH (ref 135–145)
TOTAL PROTEIN: 5.6 g/dL — AB (ref 6.5–8.1)

## 2015-05-25 LAB — CBC
HEMATOCRIT: 41.8 % (ref 35.0–47.0)
Hemoglobin: 13.7 g/dL (ref 12.0–16.0)
MCH: 30 pg (ref 26.0–34.0)
MCHC: 32.8 g/dL (ref 32.0–36.0)
MCV: 91.5 fL (ref 80.0–100.0)
PLATELETS: 69 10*3/uL — AB (ref 150–440)
RBC: 4.56 MIL/uL (ref 3.80–5.20)
RDW: 17.8 % — AB (ref 11.5–14.5)
WBC: 5 10*3/uL (ref 3.6–11.0)

## 2015-05-25 LAB — MAGNESIUM: Magnesium: 1.9 mg/dL (ref 1.7–2.4)

## 2015-05-25 MED ORDER — CAPTOPRIL 12.5 MG PO TABS
12.5000 mg | ORAL_TABLET | Freq: Two times a day (BID) | ORAL | Status: DC
  Filled 2015-05-25 (×2): qty 1

## 2015-05-25 MED ORDER — LEVOFLOXACIN 500 MG PO TABS
500.0000 mg | ORAL_TABLET | Freq: Every day | ORAL | Status: DC
  Administered 2015-05-25: 500 mg
  Filled 2015-05-25: qty 1

## 2015-05-25 MED ORDER — DEXMEDETOMIDINE HCL IN NACL 400 MCG/100ML IV SOLN
0.0000 ug/kg/h | INTRAVENOUS | Status: AC
  Administered 2015-05-25 (×3): 1 ug/kg/h via INTRAVENOUS
  Administered 2015-05-26 (×3): 1.1 ug/kg/h via INTRAVENOUS
  Administered 2015-05-26: 1 ug/kg/h via INTRAVENOUS
  Administered 2015-05-26: 1.1 ug/kg/h via INTRAVENOUS
  Administered 2015-05-26: 1 ug/kg/h via INTRAVENOUS
  Administered 2015-05-27 – 2015-05-28 (×11): 1.1 ug/kg/h via INTRAVENOUS
  Filled 2015-05-25 (×21): qty 100

## 2015-05-25 MED ORDER — MIDAZOLAM HCL 2 MG/2ML IJ SOLN: 2.0000 mg | INTRAMUSCULAR | Status: DC | PRN

## 2015-05-25 MED ORDER — FENTANYL CITRATE (PF) 100 MCG/2ML IJ SOLN
25.0000 ug | INTRAMUSCULAR | Status: DC | PRN
  Administered 2015-05-26 – 2015-05-28 (×5): 100 ug via INTRAVENOUS
  Administered 2015-05-28 – 2015-05-29 (×2): 50 ug via INTRAVENOUS
  Administered 2015-05-29 – 2015-06-02 (×8): 100 ug via INTRAVENOUS
  Administered 2015-06-03 – 2015-06-04 (×3): 50 ug via INTRAVENOUS
  Administered 2015-06-05 (×2): 100 ug via INTRAVENOUS
  Filled 2015-05-25 (×20): qty 2

## 2015-05-25 MED ORDER — FAMOTIDINE 20 MG PO TABS
20.0000 mg | ORAL_TABLET | Freq: Two times a day (BID) | ORAL | Status: DC
  Administered 2015-05-25 – 2015-06-06 (×25): 20 mg
  Filled 2015-05-25 (×25): qty 1

## 2015-05-25 MED ORDER — LOSARTAN POTASSIUM 50 MG PO TABS
100.0000 mg | ORAL_TABLET | Freq: Every day | ORAL | Status: DC
  Administered 2015-05-25 – 2015-06-07 (×14): 100 mg via ORAL
  Filled 2015-05-25 (×14): qty 2

## 2015-05-25 MED ORDER — MIDAZOLAM HCL 2 MG/2ML IJ SOLN
1.0000 mg | INTRAMUSCULAR | Status: DC | PRN
  Administered 2015-05-25 – 2015-05-29 (×15): 2 mg via INTRAVENOUS
  Administered 2015-05-30: 1 mg via INTRAVENOUS
  Administered 2015-05-31 – 2015-06-02 (×2): 2 mg via INTRAVENOUS
  Administered 2015-06-03: 1 mg via INTRAVENOUS
  Administered 2015-06-04 – 2015-06-06 (×5): 2 mg via INTRAVENOUS
  Filled 2015-05-25 (×25): qty 2

## 2015-05-25 MED ORDER — GUAIFENESIN ER 600 MG PO TB12: 600.0000 mg | ORAL_TABLET | Freq: Two times a day (BID) | ORAL | Status: DC

## 2015-05-25 NOTE — Consult Note (Signed)
[facility] Consultation      Name: JONEISHA MILES MRN: [id number] DOB: Mar 24, 1946    ADMISSION DATE:  05/08/2015  CHIEF COMPLAINT:  Lethargic and resp distress   HISTORY OF PRESENT ILLNESS 05/12/2015  69 yo obese white female admitted to ICU for acute resp failure  The patient presents via EMS complaining of shortness of breath. She reportedly had oxygen saturations as low as 70% when EMS arrived. The patient was on BiPAP at that time. FiO2 was increased to 100% and oxygen saturations improved to only 79%. In the emergency department patient was immediately initiated on BiPAP. She had aggressively become more somnolent.  Chest x-ray showed worsening pleural effusion. Patient on biPAP fio2 at 50% now  Subjective: Patient transition to PSV this morning tolerated about 2-1/2 hours, try to lower settings on PSV, patient became tachypneic and desatted down to lower 80s.    SIGNIFICANT EVENTS  CT chest R lung mass, intubated on 10/25, bronch with biopsy, overnight with some tachycardia but spontaneously resolved.    ICU admission 10/24 10/25>>CT Chest with R lung mass >>intubated 10/25>>bronchoscopy with forceps biopsy of RMS and RUL carina (forceps and FNA)>> small cell   SUBJ: RASS -1, + F/C. Failed attempts at PSV weaning  Review of Systems  Unable to perform ROS: critical illness      VITAL SIGNS    Temp:  [98.3 F (36.8 C)-99.1 F (37.3 C)] 99.1 F (37.3 C) (10/30 0800) Pulse Rate:  [78-95] 84 (10/30 1500) Resp:  [15-26] 19 (10/30 1500) BP: (127-172)/(72-88) 154/72 mmHg (10/30 1500) SpO2:  [88 %-94 %] 92 % (10/30 1500) FiO2 (%):  [25 %-28 %] 28 % (10/30 1338) Weight:  [109.5 kg (241 lb 6.5 oz)] 109.5 kg (241 lb 6.5 oz) (10/30 0351) HEMODYNAMICS:   VENTILATOR SETTINGS: Vent Mode:  [-] PRVC FiO2 (%):  [25 %-28 %] 28 % Set Rate:  [14 bmp] 14 bmp Vt Set:  [500 mL] 500 mL PEEP:  [5 cmH20] 5 cmH20 Pressure Support:  [5 cmH20] 5  cmH20 Plateau Pressure:  [20 cmH20] 20 cmH20 INTAKE / OUTPUT:  Intake/Output Summary (Last 24 hours) at 05/25/15 1531 Last data filed at 05/25/15 1500  Gross per 24 hour  Intake   2860 ml  Output   2610 ml  Net    250 ml       PHYSICAL EXAM   Physical Exam  Cardiovascular: Normal rate, regular rhythm and normal heart sounds.   No murmur heard. Pulmonary/Chest: No respiratory distress. She has no wheezes.  Intubated. Shallow BS at the bases, dec BS at on the right, mild coarse upper airway sounds.   Abdominal: Soft. Bowel sounds are normal. She exhibits no distension. There is no tenderness.  Musculoskeletal: She exhibits no edema.  Neurological:  RASS -1. No focal deficits  Skin: Skin is warm.  Nursing note and vitals reviewed.      LABS    Recent Labs Lab 05/23/15 0551 05/24/15 0510 05/25/15 0503  WBC 3.3* 3.4* 5.0  HGB 13.4 13.7 13.7  HCT 41.3 41.9 41.8  PLT 74* 66* 69*   Coag's  Recent Labs Lab 05/22/15 1217  APTT 25  INR 1.04   BMET  Recent Labs Lab 05/23/15 0551 05/24/15 0510 05/25/15 0503  NA 142 148* 147*  K 3.8 4.6 4.1  CL 104 108 110  CO2 '29 31 29  '$ BUN 49* 52* 58*  CREATININE 1.30* 1.24* 1.07*  GLUCOSE 187* 208* 164*   Electrolytes  Recent Labs Lab 05/23/15 0551 05/24/15 0510 05/25/15 0503  CALCIUM 8.3* 8.4* 8.5*  MG 1.9  --  1.9  PHOS 4.1  --  3.7   Sepsis Markers  Recent Labs Lab 05/26/2015 0113 04/28/2015 0410  LATICACIDVEN 2.3* 1.0   ABG  Recent Labs Lab 05/20/15 0935 05/20/15 1430 05/21/15 0845  PHART 7.45 7.37 7.42  PCO2ART 46 59* 52*  PO2ART 62* 62* 79*   Liver Enzymes  Recent Labs Lab 05/03/2015 0036 05/25/15 0503  AST 24 33  ALT 13* 30  ALKPHOS 79 42  BILITOT 0.8 0.6  ALBUMIN 3.4* 2.7*   Cardiac Enzymes  Recent Labs Lab 05/11/2015 0036  TROPONINI <0.03   Glucose  Recent Labs Lab 05/24/15 1614 05/24/15 1935 05/24/15 2338 05/25/15 0329 05/25/15 0733 05/25/15 1127  GLUCAP 188* 228*  236* 157* 204* 199*     Recent Results (from the past 240 hour(s))  MRSA PCR Screening     Status: None   Collection Time: 05/03/2015 12:28 AM  Result Value Ref Range Status   MRSA by PCR NEGATIVE NEGATIVE Final    Comment:        The GeneXpert MRSA Assay (FDA approved for NASAL specimens only), is one component of a comprehensive MRSA colonization surveillance program. It is not intended to diagnose MRSA infection nor to guide or monitor treatment for MRSA infections.   Culture, blood (routine x 2)     Status: None   Collection Time: 05/16/2015  1:13 AM  Result Value Ref Range Status   Specimen Description BLOOD RIGHT HAND  Final   Special Requests BOTTLES DRAWN AEROBIC AND ANAEROBIC 3CC  Final   Culture NO GROWTH 5 DAYS  Final   Report Status 05/24/2015 FINAL  Final  Culture, blood (routine x 2)     Status: None   Collection Time: 04/30/2015  1:13 AM  Result Value Ref Range Status   Specimen Description BLOOD RIGHT ASSIST CONTROL  Final   Special Requests BOTTLES DRAWN AEROBIC AND ANAEROBIC 4CC  Final   Culture NO GROWTH 5 DAYS  Final   Report Status 05/24/2015 FINAL  Final  Culture, expectorated sputum-assessment     Status: None   Collection Time: 05/20/15  2:00 PM  Result Value Ref Range Status   Specimen Description EXPECTORATED SPUTUM  Final   Special Requests NONE  Final   Sputum evaluation THIS SPECIMEN IS ACCEPTABLE FOR SPUTUM CULTURE  Final   Report Status 05/20/2015 FINAL  Final  Culture, respiratory (NON-Expectorated)     Status: None   Collection Time: 05/20/15  2:00 PM  Result Value Ref Range Status   Specimen Description EXPECTORATED SPUTUM  Final   Special Requests NONE Reflexed from [id number]  Final   Gram Stain   Final    FEW WBC SEEN FEW GRAM POSITIVE RODS FAIR SPECIMEN - 70-80% WBCS    Culture APPEARS TO BE NORMAL FLORA  Final   Report Status 05/22/2015 FINAL  Final  Culture, bal-quantitative     Status: None   Collection Time: 05/20/15  5:05 PM   Result Value Ref Range Status   Specimen Description BRONCHIAL ALVEOLAR LAVAGE  Final   Special Requests Normal  Final   Gram Stain   Final    MODERATE WBC SEEN RARE GRAM POSITIVE RODS RARE GRAM POSITIVE COCCI IN CHAINS GOOD SPECIMEN - 80-90% WBCS    Culture APPEARS TO BE NORMAL FLORA  Final   Report Status 05/22/2015 FINAL  Final  Fungus Culture with Smear  Status: None (Preliminary result)   Collection Time: 05/20/15  5:05 PM  Result Value Ref Range Status   Specimen Description SPUTUM  Final   Special Requests Normal  Final   Fungal Smear PENDING  Incomplete   Culture NO FUNGUS ISOLATED AFTER 2 DAYS  Final   Report Status PENDING  Incomplete     IMAGING   CXR: [place]  INDWELLING DEVICES:: ETT 10/25 >>  R Fem CVL 10/25>>  MICRO DATA: MRSA PCR >>neg Blood 10/24 >> NEG BAL 10/25>> normal flora   ANTIMICROBIALS:  Zosyn/vancomycin 10/24>> 10/30 Levofloxacin 10/30 >>     ASSESSMENT/PLAN  Acute Resp Failure - ventilator dependence R lung collapse Drowned R lung New diagnosis of metastatic small cell ca of lung Thrombocytopenia - critical illness, avoid heparins Suspected post obstructive PNA DM 2 CKD  Cont vent support - settings reviewed and/or adjusted Cont vent bundle Daily SBT if/when meets criteria Monitor BMET intermittently Monitor I/Os Correct electrolytes as indicated Monitor temp, WBC count Micro and abx as above DVT px: SQ heparin Monitor CBC intermittently Transfuse per usual ICU guidelines  Discussed with Oncology. Will work towards extubation if can be done safely. Regardless, will initiate chem and XRT Mon 10/31  CCM time: 30 mins The above time includes time spent in consultation with patient and/or family members and reviewing care plan on multidisciplinary rounds  Merton Border, MD PCCM service Mobile [phone] Pager [phone]

## 2015-05-25 NOTE — Progress Notes (Signed)
Pharmacy Consult for Electrolyte Monitoring   Allergies  Allergen Reactions  . Atorvastatin Other (See Comments)    Does not remember why she had Intolerance to Lipitor.  . Fentanyl Itching  . Rosuvastatin Anxiety    Chest tigtness and feeling as if she had heartburn.    Patient Measurements: Height: '5\' 7"'$  (170.2 cm) Weight: 241 lb 6.5 oz (109.5 kg) IBW/kg (Calculated) : 61.6   Vital Signs: Temp: 98.3 F (36.8 C) (10/30 0100) Temp Source: Oral (10/30 0100) BP: 158/79 mmHg (10/30 0700) Pulse Rate: 88 (10/30 0700) Intake/Output from previous day: 10/29 0701 - 10/30 0700 In: 3093.2 [I.V.:483.2; NG/GT:2610] Out: 2685 [Urine:2685] Intake/Output from this shift:    Labs:  Recent Labs  05/22/15 1217 05/23/15 0551 05/24/15 0510 05/25/15 0503  WBC  --  3.3* 3.4* 5.0  HGB  --  13.4 13.7 13.7  HCT  --  41.3 41.9 41.8  PLT  --  74* 66* 69*  APTT 25  --   --   --   INR 1.04  --   --   --      Recent Labs  05/23/15 0551 05/23/15 1322 05/24/15 0510 05/25/15 0503  NA 142  --  148* 147*  K 3.8  --  4.6 4.1  CL 104  --  108 110  CO2 29  --  31 29  GLUCOSE 187*  --  208* 164*  BUN 49*  --  52* 58*  CREATININE 1.30*  --  1.24* 1.07*  CALCIUM 8.3*  --  8.4* 8.5*  MG 1.9  --   --  1.9  PHOS 4.1  --   --  3.7  PROT  --   --   --  5.6*  ALBUMIN  --   --   --  2.7*  AST  --   --   --  33  ALT  --   --   --  30  ALKPHOS  --   --   --  42  BILITOT  --   --   --  0.6  TRIG  --  307*  --   --    Estimated Creatinine Clearance: 65.1 mL/min (by C-G formula based on Cr of 1.07).    Recent Labs  05/24/15 2338 05/25/15 0329 05/25/15 0733  GLUCAP 236* 157* 204*    Medical History: Past Medical History  Diagnosis Date  . Anxiety   . Arthritis   . COPD (chronic obstructive pulmonary disease) ([place])   . CHF (congestive heart failure) ([place])   . Hyperlipidemia   . Hypertension   . Diabetes mellitus without complication ([place])   . Chronic kidney disease   .  Neuromuscular disorder (HCC)     Medications:  Scheduled:  . antiseptic oral rinse  7 mL Mouth Rinse QID  . budesonide (PULMICORT) nebulizer solution  0.5 mg Nebulization BID  . chlorhexidine gluconate  15 mL Mouth Rinse BID  . colchicine  0.6 mg Per Tube Daily  . famotidine  20 mg Per Tube BID  . free water  200 mL Per Tube Q4H  . insulin aspart  0-15 Units Subcutaneous 6 times per day  . ipratropium-albuterol  3 mL Nebulization Q6H  . levofloxacin  500 mg Oral Daily  . linagliptin  5 mg Per Tube Daily  . methylPREDNISolone (SOLU-MEDROL) injection  40 mg Intravenous Q12H  . metoprolol  100 mg Per Tube BID  . pregabalin  50 mg Per Tube TID  Infusions:  . dexmedetomidine 1 mcg/kg/hr (05/25/15 0357)  . feeding supplement (VITAL HIGH PROTEIN) 1,000 mL (05/24/15 2101)    Assessment: Electrolytes are WNL.  Plan:  No supplementation warranted at this time.  Will reorder levels with AM labs.  [place].D.  Clinical Pharmacist 05/25/2015,7:55 AM

## 2015-05-25 NOTE — Progress Notes (Addendum)
[facility] [place] NAME: Emily Pratt    MR#:  [id number]  DATE OF BIRTH:  06-30-1946  SUBJECTIVE:  CHIEF COMPLAINT:  Intubated , very short of breath and uncomfortable earlier today, for which she required Versed sedation, apart from Precedex infusion, Leonette Tischer since now to touch but not able to open eyes or interact no review of systems.   REVIEW OF SYSTEMS:   Unable to obtain  DRUG ALLERGIES:   Allergies  Allergen Reactions  . Atorvastatin Other (See Comments)    Does not remember why she had Intolerance to Lipitor.  . Fentanyl Itching  . Rosuvastatin Anxiety    Chest tigtness and feeling as if she had heartburn.    VITALS:  Blood pressure 172/87, pulse 78, temperature 99.1 F (37.3 C), temperature source Axillary, resp. rate 18, height '5\' 7"'$  (1.702 m), weight 109.5 kg (241 lb 6.5 oz), SpO2 91 %.  PHYSICAL EXAMINATION:  GENERAL:  69 y.o.-year-old patient lying in the bed, intubated, sedated, although grimacing to painful stimuli and withdraws all extremities EYES: Pupils equal, round, reactive to light and accommodation. No scleral icterus.  HEENT: Head atraumatic, normocephalic. Oropharynx and nasopharynx clear. ETT in place NECK:  Supple, no jugular venous distention. No thyroid enlargement, no tenderness.  LUNGS: Markedly diminished breath sounds on the right , Scattered wheezes and rhonchi, coarse breath sounds on the right, good air movements on the left  CARDIOVASCULAR: S1, S2 normal. No murmurs, rubs, or gallops.  ABDOMEN: Soft, nontender, nondistended. Bowel sounds present. No organomegaly or mass.  EXTREMITIES: +1 pedal edema, cyanosis, or clubbing.  NEUROLOGIC: CN 2-12 intact, strength 5/5, sedated, does not follow commands PSYCHIATRIC: Somnolent SKIN: Sacral decubitus ulcer   LABORATORY PANEL:   CBC  Recent Labs Lab 05/25/15 0503  WBC 5.0  HGB 13.7  HCT 41.8  PLT 69*    ------------------------------------------------------------------------------------------------------------------  Chemistries   Recent Labs Lab 05/25/15 0503  NA 147*  K 4.1  CL 110  CO2 29  GLUCOSE 164*  BUN 58*  CREATININE 1.07*  CALCIUM 8.5*  MG 1.9  AST 33  ALT 30  ALKPHOS 42  BILITOT 0.6   ------------------------------------------------------------------------------------------------------------------  Cardiac Enzymes  Recent Labs Lab 05/24/2015 0036  TROPONINI <0.03   ------------------------------------------------------------------------------------------------------------------  RADIOLOGY:  Dg Chest Port 1 View  05/25/2015  CLINICAL DATA:  Respiratory failure. EXAM: PORTABLE CHEST 1 VIEW COMPARISON:  05/21/2015 FINDINGS: The ET tube tip is above the carina. The nasogastric tube tip is below the GE junction. Normal heart size. Aortic atherosclerosis. Partially loculated right pleural effusion is again identified and appears unchanged from the previous exam. Left lung is clear. IMPRESSION: 1. Loculated right pleural effusion is stable. 2. Left lung is clear. Electronically Signed   By: Kerby Moors M.D.   On: 05/25/2015 09:03    EKG:   Orders placed or performed during the hospital encounter of 04/27/2015  . EKG 12-Lead  . EKG 12-Lead    ASSESSMENT AND PLAN:   1. Acute on chronic respiratory failure with hypoxia and Hypercapnia,  - Multifactorial due to large right-sided pleural effusion/ CHF/ COPD / mass- SCLC - Pulmonology following - Continue nebulizer, steroids, antibiotics. Oncology is planning chemotherapy on Monday with cisplatin and etoposide with hopes to shrink the mass. Getting palliative care involved for further recommendations, as apparently patient's resistance intubation in the first place.   2. Acute Congestive heart failure: Mixed systolic and diastolic  - Echo 53/66 shows ejection fraction 30 -  35% with abnormal filling - Continue  metoprolol per cardiology's recommendations , add captopril to improve afterload following kidney function closely  3. Acute on chronic kidney disease:  - Due to ATN, hypovolemia and hypotension - Continues to improve - Nephrology signing off. Patient is going to be started on losartan now. Following kidney function closely  4. Essential hypertension:  - Stable, continue metoprolol and initiate losartan  5. Diabetes mellitus type 2: Hold home medications Provide sliding scale insulin, blood glucose is ranging around 200  6. Lung mass: Small cell lung cancer, likely stage IV per oncology - Status post bronchoscopy and 10/25, pathology show small cell lung cancer - Long-term history of heavy smoking - pathology reports . Discussed with the patient and sister.  Get palliative care involved for further recommendations, as apparently patient resisted intubation in the first place . - Oncology following, and  initiating chemotherapy on Monday,  PICC line for chemotherapy is being placed- Radiation oncology has been consulted and patient may benefit from radiology oncology intervention as well, although since she is intubated. It might be difficult to perform  7. Postobstructive pneumonia - Blood cultures negative to date, respiratory, BAL, AFB all pending - Discontinue vancomycin and Zosyn, continue Levaquin  8. Arrhythmia, SVT versus paroxysmal atrial fibrillation during bronchoscopy, now in sinus rhythm, continue metoprolol per cardiology's recommendations  All the records are reviewed and case discussed with the patient's husband and sister at the bedside.  CODE STATUS: Full code  TOTAL CRITICAL CARE TIME TAKING CARE OF THIS PATIENT: 45 minutes.   POSSIBLE D/C IN ?  DAYS, DEPENDING ON CLINICAL CONDITION.   Theodoro Grist M.D on 05/25/2015 at 11:36 AM  Between 7am to 6pm - Pager - [phone] After 6pm go to [email or web address] - password [email or web address] [facility]  Office   [phone]  CC: Primary care physician; Dagoberto Ligas, MD

## 2015-05-26 ENCOUNTER — Inpatient Hospital Stay: Payer: Medicare Other

## 2015-05-26 DIAGNOSIS — E509 Vitamin A deficiency, unspecified: Secondary | ICD-10-CM

## 2015-05-26 DIAGNOSIS — D696 Thrombocytopenia, unspecified: Secondary | ICD-10-CM

## 2015-05-26 DIAGNOSIS — F17210 Nicotine dependence, cigarettes, uncomplicated: Secondary | ICD-10-CM

## 2015-05-26 LAB — COMPREHENSIVE METABOLIC PANEL
ALK PHOS: 44 U/L (ref 38–126)
ALT: 41 U/L (ref 14–54)
ANION GAP: 8 (ref 5–15)
AST: 38 U/L (ref 15–41)
Albumin: 2.8 g/dL — ABNORMAL LOW (ref 3.5–5.0)
BUN: 65 mg/dL — ABNORMAL HIGH (ref 6–20)
CALCIUM: 8.7 mg/dL — AB (ref 8.9–10.3)
CO2: 30 mmol/L (ref 22–32)
CREATININE: 1.12 mg/dL — AB (ref 0.44–1.00)
Chloride: 112 mmol/L — ABNORMAL HIGH (ref 101–111)
GFR, EST AFRICAN AMERICAN: 58 mL/min — AB (ref >60)
GFR, EST NON AFRICAN AMERICAN: 50 mL/min — AB (ref >60)
Glucose, Bld: 195 mg/dL — ABNORMAL HIGH (ref 65–99)
Potassium: 3.9 mmol/L (ref 3.5–5.1)
SODIUM: 150 mmol/L — AB (ref 135–145)
Total Bilirubin: 0.7 mg/dL (ref 0.3–1.2)
Total Protein: 5.7 g/dL — ABNORMAL LOW (ref 6.5–8.1)

## 2015-05-26 LAB — GLUCOSE, CAPILLARY
Glucose-Capillary: 181 mg/dL — ABNORMAL HIGH (ref 65–99)
Glucose-Capillary: 202 mg/dL — ABNORMAL HIGH (ref 65–99)
Glucose-Capillary: 235 mg/dL — ABNORMAL HIGH (ref 65–99)
Glucose-Capillary: 242 mg/dL — ABNORMAL HIGH (ref 65–99)
Glucose-Capillary: 246 mg/dL — ABNORMAL HIGH (ref 65–99)
Glucose-Capillary: 252 mg/dL — ABNORMAL HIGH (ref 65–99)
Glucose-Capillary: 252 mg/dL — ABNORMAL HIGH (ref 65–99)
Glucose-Capillary: 257 mg/dL — ABNORMAL HIGH (ref 65–99)

## 2015-05-26 LAB — CBC
HCT: 41.4 % (ref 35.0–47.0)
HEMOGLOBIN: 13.7 g/dL (ref 12.0–16.0)
MCH: 30.5 pg (ref 26.0–34.0)
MCHC: 33.1 g/dL (ref 32.0–36.0)
MCV: 92.3 fL (ref 80.0–100.0)
PLATELETS: 72 10*3/uL — AB (ref 150–440)
RBC: 4.49 MIL/uL (ref 3.80–5.20)
RDW: 18.1 % — ABNORMAL HIGH (ref 11.5–14.5)
WBC: 5.9 10*3/uL (ref 3.6–11.0)

## 2015-05-26 LAB — PHOSPHORUS: Phosphorus: 4 mg/dL (ref 2.5–4.6)

## 2015-05-26 LAB — MAGNESIUM: MAGNESIUM: 1.9 mg/dL (ref 1.7–2.4)

## 2015-05-26 MED ORDER — POTASSIUM CHLORIDE 2 MEQ/ML IV SOLN
Freq: Once | INTRAVENOUS | Status: AC
  Administered 2015-05-26: 14:00:00 via INTRAVENOUS
  Filled 2015-05-26: qty 10

## 2015-05-26 MED ORDER — PREDNISONE 20 MG PO TABS
40.0000 mg | ORAL_TABLET | Freq: Every day | ORAL | Status: AC
  Administered 2015-05-27: 40 mg via ORAL
  Filled 2015-05-26: qty 2

## 2015-05-26 MED ORDER — SODIUM CHLORIDE 0.9 % IV SOLN
80.0000 mg/m2 | Freq: Once | INTRAVENOUS | Status: AC
  Administered 2015-05-26: 180 mg via INTRAVENOUS
  Filled 2015-05-26: qty 9

## 2015-05-26 MED ORDER — PALONOSETRON HCL INJECTION 0.25 MG/5ML
0.2500 mg | Freq: Once | INTRAVENOUS | Status: AC
  Administered 2015-05-26: 0.25 mg via INTRAVENOUS
  Filled 2015-05-26: qty 5

## 2015-05-26 MED ORDER — PREDNISONE 10 MG PO TABS
10.0000 mg | ORAL_TABLET | Freq: Every day | ORAL | Status: AC
  Administered 2015-05-30: 10 mg via ORAL
  Filled 2015-05-26: qty 1

## 2015-05-26 MED ORDER — SODIUM CHLORIDE 0.9 % IV SOLN
Freq: Once | INTRAVENOUS | Status: AC
  Administered 2015-05-26: 16:00:00 via INTRAVENOUS
  Filled 2015-05-26: qty 5

## 2015-05-26 MED ORDER — PREDNISONE 10 MG PO TABS
30.0000 mg | ORAL_TABLET | Freq: Every day | ORAL | Status: AC
  Administered 2015-05-28: 30 mg via ORAL
  Filled 2015-05-26: qty 1

## 2015-05-26 MED ORDER — CISPLATIN CHEMO INJECTION 100MG/100ML
180.0000 mg | Freq: Once | INTRAVENOUS | Status: AC
  Administered 2015-05-26: 180 mg via INTRAVENOUS
  Filled 2015-05-26: qty 180

## 2015-05-26 MED ORDER — AMLODIPINE BESYLATE 5 MG PO TABS
5.0000 mg | ORAL_TABLET | Freq: Every day | ORAL | Status: DC
  Administered 2015-05-26 – 2015-06-07 (×13): 5 mg via ORAL
  Filled 2015-05-26 (×13): qty 1

## 2015-05-26 MED ORDER — INSULIN ASPART 100 UNIT/ML ~~LOC~~ SOLN
3.0000 [IU] | SUBCUTANEOUS | Status: DC
  Administered 2015-05-26 – 2015-05-28 (×10): 3 [IU] via SUBCUTANEOUS
  Filled 2015-05-26 (×8): qty 3

## 2015-05-26 MED ORDER — SODIUM CHLORIDE 0.9 % IV SOLN
Freq: Once | INTRAVENOUS | Status: AC
  Administered 2015-05-26: 14:00:00 via INTRAVENOUS

## 2015-05-26 MED ORDER — FREE WATER
250.0000 mL | Status: DC
  Administered 2015-05-26 – 2015-05-30 (×26): 250 mL

## 2015-05-26 MED ORDER — PREDNISONE 20 MG PO TABS
20.0000 mg | ORAL_TABLET | Freq: Every day | ORAL | Status: AC
  Administered 2015-05-29: 20 mg via ORAL
  Filled 2015-05-26: qty 1

## 2015-05-26 NOTE — Consult Note (Signed)
Palliative Care Consult initiated.  She is clearly indicating she would want a trache if needed (as has been brought up earlier with her today).  She doesn't understand what code status is all about, so I explained a little bit about this, but she is fatigued, and I will revisit this matter.    It seems that she might or might not respond to urgent interventions for this cancer; so I will remain involved until there is more clarity about her progress or lack thereof.    See full note to follow.  Kirby Funk, MD

## 2015-05-26 NOTE — Progress Notes (Signed)
[facility]  [phone] [phone]  ID: Emily Pratt OB: 1946-05-30  MR#: [id number]  DGL#:[id number]  Patient Care Team: Dagoberto Ligas, MD as PCP - General (Internal Medicine)  CHIEF COMPLAINT:  Chief Complaint  Patient presents with  . Respiratory Distress    INTERVAL HISTORY: Patient alert, but still intubated. Husband and sister are at bedside. Extubation has been difficult, likely secondary to external compression of her biopsy proven small cell lung cancer. Review of systems is difficult, but patient offers no other complaints.  REVIEW OF SYSTEMS:   Review of Systems  Unable to perform ROS: intubated    As per HPI. Otherwise, a complete review of systems is negatve.  PAST MEDICAL HISTORY: Past Medical History  Diagnosis Date  . Anxiety   . Arthritis   . COPD (chronic obstructive pulmonary disease) ([place])   . CHF (congestive heart failure) ([place])   . Hyperlipidemia   . Hypertension   . Diabetes mellitus without complication ([place])   . Chronic kidney disease   . Neuromuscular disorder ([place])     PAST SURGICAL HISTORY: Past Surgical History  Procedure Laterality Date  . Joint replacement Bilateral 2006    both knees  . Ankle surgery      FAMILY HISTORY Family History  Problem Relation Age of Onset  . Diabetes Mother   . Cancer Father   . Diabetes Sister   . Stroke Sister        ADVANCED DIRECTIVES:    HEALTH MAINTENANCE: Social History  Substance Use Topics  . Smoking status: Current Every Day Smoker    Types: Cigarettes  . Smokeless tobacco: None     Comment: 0.5 pack /day  . Alcohol Use: No     Colonoscopy:  PAP:  Bone density:  Lipid panel:  Allergies  Allergen Reactions  . Atorvastatin Other (See Comments)    Does not remember why she had Intolerance to Lipitor.  . Fentanyl Itching  . Rosuvastatin Anxiety    Chest tigtness and feeling as if she had heartburn.    Current  Facility-Administered Medications  Medication Dose Route Frequency Provider Last Rate Last Dose  . 0.9 %  sodium chloride infusion   Intravenous Once Lloyd Huger, MD      . acetaminophen (TYLENOL) tablet 650 mg  650 mg Oral Q6H PRN Harrie Foreman, MD   650 mg at 05/25/15 0630   Or  . acetaminophen (TYLENOL) suppository 650 mg  650 mg Rectal Q6H PRN Harrie Foreman, MD      . amLODipine (NORVASC) tablet 5 mg  5 mg Oral Daily Theodoro Grist, MD   5 mg at 05/26/15 1130  . antiseptic oral rinse solution (CORINZ)  7 mL Mouth Rinse QID Vishal Mungal, MD   7 mL at 05/26/15 1107  . budesonide (PULMICORT) nebulizer solution 0.5 mg  0.5 mg Nebulization BID Flora Lipps, MD   0.5 mg at 05/26/15 0807  . chlorhexidine gluconate (PERIDEX) 0.12 % solution 15 mL  15 mL Mouth Rinse BID Vishal Mungal, MD   15 mL at 05/26/15 0758  . CISplatin (PLATINOL) 180 mg in sodium chloride 0.9 % 1,000 mL chemo infusion  180 mg Intravenous Once Lloyd Huger, MD      . colchicine tablet 0.6 mg  0.6 mg Per Tube Daily Wilhelmina Mcardle, MD   0.6 mg at 05/26/15 0919  . dexmedetomidine (PRECEDEX) 400 MCG/100ML (4 mcg/mL) infusion  0-1.2 mcg/kg/hr Intravenous Continuous Wilhelmina Mcardle,  MD 29 mL/hr at 05/26/15 1018 1.1 mcg/kg/hr at 05/26/15 1018  . dextrose 5 % and 0.45% NaCl 1,000 mL with potassium chloride 20 mEq, magnesium sulfate 12 mEq, mannitol 12.5 g infusion   Intravenous Once Lloyd Huger, MD      . etoposide (VEPESID) 180 mg in sodium chloride 0.9 % 500 mL chemo infusion  80 mg/m2 (Treatment Plan Actual) Intravenous Once Lloyd Huger, MD      . famotidine (PEPCID) tablet 20 mg  20 mg Per Tube BID Theodoro Grist, MD   20 mg at 05/26/15 0919  . feeding supplement (VITAL HIGH PROTEIN) liquid 1,000 mL  1,000 mL Per Tube Continuous Vishal Mungal, MD 50 mL/hr at 05/24/15 2101 1,000 mL at 05/24/15 2101  . fentaNYL (SUBLIMAZE) injection 25-100 mcg  25-100 mcg Intravenous Q2H PRN Wilhelmina Mcardle, MD   100 mcg  at 05/26/15 0203  . fosaprepitant (EMEND) 150 mg, dexamethasone (DECADRON) 12 mg in sodium chloride 0.9 % 145 mL IVPB   Intravenous Once Lloyd Huger, MD      . free water 250 mL  250 mL Per Tube Q4H Laverle Hobby, MD   250 mL at 05/26/15 1145  . insulin aspart (novoLOG) injection 0-15 Units  0-15 Units Subcutaneous 6 times per day Wilhelmina Mcardle, MD   8 Units at 05/26/15 1216  . insulin aspart (novoLOG) injection 3 Units  3 Units Subcutaneous Q4H Theodoro Grist, MD   3 Units at 05/26/15 1211  . ipratropium-albuterol (DUONEB) 0.5-2.5 (3) MG/3ML nebulizer solution 3 mL  3 mL Nebulization Q6H Vishal Mungal, MD   3 mL at 05/26/15 0807  . levofloxacin (LEVAQUIN) tablet 500 mg  500 mg Per Tube Daily Wilhelmina Mcardle, MD   500 mg at 05/25/15 1953  . linagliptin (TRADJENTA) tablet 5 mg  5 mg Per Tube Daily Wilhelmina Mcardle, MD   5 mg at 05/26/15 0919  . losartan (COZAAR) tablet 100 mg  100 mg Oral Daily Theodoro Grist, MD   100 mg at 05/26/15 0920  . methylPREDNISolone sodium succinate (SOLU-MEDROL) 40 mg/mL injection 40 mg  40 mg Intravenous Q12H Wilhelmina Mcardle, MD   40 mg at 05/26/15 0503  . metoprolol (LOPRESSOR) tablet 100 mg  100 mg Per Tube BID Wilhelmina Mcardle, MD   100 mg at 05/26/15 0919  . midazolam (VERSED) injection 1-2 mg  1-2 mg Intravenous Q2H PRN Wilhelmina Mcardle, MD   2 mg at 05/26/15 1244  . ondansetron (ZOFRAN) tablet 4 mg  4 mg Oral Q6H PRN Harrie Foreman, MD       Or  . ondansetron [facility]) injection 4 mg  4 mg Intravenous Q6H PRN Harrie Foreman, MD      . palonosetron (ALOXI) injection 0.25 mg  0.25 mg Intravenous Once Lloyd Huger, MD      . pregabalin (LYRICA) capsule 50 mg  50 mg Per Tube TID Wilhelmina Mcardle, MD   50 mg at 05/26/15 0919    OBJECTIVE: Filed Vitals:   05/26/15 1110  BP:   Pulse:   Temp: 97.7 F (36.5 C)  Resp:      Body mass index is 37.28 kg/(m^2).    ECOG FS:4 - Bedbound  General:  intubated, but alert Eyes: Pink conjunctiva,  anicteric sclera. HEENT:  ET tube in place. Lungs: Clear to auscultation bilaterally. Heart: Regular rate and rhythm. No rubs, murmurs, or gallops. Abdomen: Soft, nontender, nondistended. No organomegaly noted, normoactive bowel  sounds. Musculoskeletal: No edema, cyanosis, or clubbing. Neuro: Alert, answering all questions appropriately.  Skin: No rashes or petechiae noted.   LAB RESULTS:  Lab Results  Component Value Date   NA 150* 05/26/2015   K 3.9 05/26/2015   CL 112* 05/26/2015   CO2 30 05/26/2015   GLUCOSE 195* 05/26/2015   BUN 65* 05/26/2015   CREATININE 1.12* 05/26/2015   CALCIUM 8.7* 05/26/2015   PROT 5.7* 05/26/2015   ALBUMIN 2.8* 05/26/2015   AST 38 05/26/2015   ALT 41 05/26/2015   ALKPHOS 44 05/26/2015   BILITOT 0.7 05/26/2015   GFRNONAA 50* 05/26/2015   GFRAA 58* 05/26/2015    Lab Results  Component Value Date   WBC 5.9 05/26/2015   NEUTROABS 4.1 04/24/2015   HGB 13.7 05/26/2015   HCT 41.4 05/26/2015   MCV 92.3 05/26/2015   PLT 72* 05/26/2015     STUDIES: Ct Chest Wo Contrast  05/20/2015  CLINICAL DATA:  69 year old who presented with acute hypercapnic respiratory failure, current history of COPD and CHF, stage 3 chronic kidney disease, with an enlarging right pleural effusion and right paratracheal soft tissue on chest x-ray yesterday. EXAM: CT CHEST WITHOUT CONTRAST TECHNIQUE: Multidetector CT imaging of the chest was performed following the standard protocol without IV contrast. Intravenous contrast was not administered due to the patient's chronic kidney disease. COMPARISON:  No prior CT.  Chest x-rays 04/29/2015 and earlier. FINDINGS: Best seen on coronal reformatted images is abrupt occlusion of the right upper lobe bronchus and the right bronchus intermedius centrally. As a result, there is dense consolidation in the entire right lung with a areas of hypoattenuation giving a "drowned lung" appearance. Without intravenous contrast, it is difficult to  visualize the large obstructing mass as it has similar attenuation to the consolidated lung. There is an associated small to moderate sized right pleural effusion. No pulmonary parenchymal nodules or masses involving the left lung. Linear scarring involving the medial left lower lobe. No confluent airspace consolidation in the left lung. No left pleural effusion. Apparent pleural thickening throughout the left hemithorax is related to abundant subpleural fat. Bulky right paratracheal conglomerate lymphadenopathy in station 2R and station 4R measuring approximately 5.6 x 5.0 x 7.4 cm. Enlarged lymph nodes elsewhere in the right side of the mediastinum. Enlarged right retroclavicular lymph nodes, the largest measuring approximately 2.8 x 3.8 x 3.1 cm. The bulky mediastinal lymphadenopathy compresses the superior vena cava, and there is a prominent right internal mammary vein collateral. Heart size upper normal. No pericardial effusion. Mild LAD and right coronary artery atherosclerosis. Moderate atherosclerosis involving the thoracic and upper abdominal aorta and their visualized branches. Approximate 3 cm cyst involving the posterior segment right lobe of liver. Within the limits of the unenhanced technique, no solid mass involving visualized liver. Approximate 1.6 x 2.6 x 2.2 cm low-attenuation mass involving the left adrenal gland. Solitary punctate calcification involving the proximal body of the pancreas. Visualized upper abdomen otherwise unremarkable for the unenhanced technique. IMPRESSION: 1. Obstructing central mass involving the right lung with occlusion of the right upper lobe bronchus and the bronchus intermedius with complete atelectasis of the right lung. The mass is difficult to visualize as it is of similar attenuation to the atelectatic lung. Bronchoscopy is recommended in further evaluation and for diagnosis. 2. Small to moderate-sized right pleural effusion. 3. Metastatic mediastinal  lymphadenopathy as detailed above, the largest conglomerate nodal mass in the right paratracheal region. 4. Mild compression of the superior vena cava with a  prominent right internal mammary vein collateral. 5. Approximate 3 cm cyst involving the posterior segment right lobe of the visualized liver. 6. Approximate 2.6 cm low-attenuation mass involving the left adrenal gland, likely adenoma. Electronically Signed   By: Evangeline Dakin M.D.   On: 05/20/2015 12:41   US Renal  05/21/2015  CLINICAL DATA:  Acute renal failure EXAM: RENAL / URINARY TRACT ULTRASOUND COMPLETE COMPARISON:  None. FINDINGS: Right Kidney: Length: 11 cm. Echogenicity within normal limits. No mass or hydronephrosis visualized. There is cortical thinning probable due to atrophy Left Kidney: Length: 12.2 cm. Cortical thinning is noted probable due to atrophy. Echogenicity within normal limits. No mass or hydronephrosis visualized. Bladder: The urinary bladder is decompressed with Foley catheter. IMPRESSION: 1. No hydronephrosis. No renal calculi. Bilateral cortical thinning probable due to atrophy. Decompressed urinary bladder with Foley catheter. Electronically Signed   By: Lahoma Crocker M.D.   On: 05/21/2015 09:25   Dg Chest Port 1 View  05/26/2015  CLINICAL DATA:  Respiratory failure. EXAM: PORTABLE CHEST 1 VIEW COMPARISON:  05/25/2015 .  CT 05/20/2015. FINDINGS: Endotracheal tube and NG tube noted in stable position. Interim increase consolidation in the right upper lobe. Persistent right pleural effusion. Left lung is clear. Stable cardiomegaly. No pneumothorax. No acute osseus abnormality P IMPRESSION: Interim increase in consolidation of the right upper lobe with persistent right pleural effusion. Persistent occlusion of the right mainstem bronchus appears to be present. Electronically Signed   By: [place]   On: 05/26/2015 07:13   Dg Chest Port 1 View  05/25/2015  CLINICAL DATA:  Respiratory failure. EXAM: PORTABLE CHEST 1  VIEW COMPARISON:  05/21/2015 FINDINGS: The ET tube tip is above the carina. The nasogastric tube tip is below the GE junction. Normal heart size. Aortic atherosclerosis. Partially loculated right pleural effusion is again identified and appears unchanged from the previous exam. Left lung is clear. IMPRESSION: 1. Loculated right pleural effusion is stable. 2. Left lung is clear. Electronically Signed   By: Kerby Moors M.D.   On: 05/25/2015 09:03   Dg Chest Port 1 View  05/21/2015  CLINICAL DATA:  Respiratory failure, intubated EXAM: PORTABLE CHEST 1 VIEW COMPARISON:  05/20/2015 FINDINGS: Cardiomegaly again noted. Stable endotracheal and NG tube position. Again noted almost complete opacification of the right hemi thorax. Chronic elevation of the right hemidiaphragm. Mild left basilar atelectasis. No convincing pulmonary edema. No pneumothorax. Atherosclerotic calcifications of thoracic aorta again noted. IMPRESSION: Stable endotracheal and NG tube position. Again noted almost complete opacification of the right hemi thorax. Chronic elevation of the right hemidiaphragm. Mild left basilar atelectasis. No convincing pulmonary edema. No pneumothorax. Atherosclerotic calcifications of thoracic aorta again noted. Electronically Signed   By: Lahoma Crocker M.D.   On: 05/21/2015 08:22   Dg Chest Port 1 View  05/20/2015  CLINICAL DATA:  Acute respiratory failure with hypoxia. Status post bronchoscopy and line placement. EXAM: PORTABLE CHEST 1 VIEW COMPARISON:  05/20/2015 at 1302 hours FINDINGS: Following bronchoscopy, there is now partial aeration of the right lung noted in the right upper to mid lung. There is also some aeration now noted in the right lower lung with a band of opacity noted across the mid lung bordering the minor fissure. Left lung is hyperexpanded but essentially clear. No pneumothorax. Endotracheal tube is stable with its tip 4.7 cm above the carina. Orogastric tube passes below the diaphragm.  IMPRESSION: 1. Improved right lung aeration following bronchoscopy. No pneumothorax. 2. No other change. Electronically Signed  By: Lajean Manes M.D.   On: 05/20/2015 17:42   Dg Chest Port 1 View  05/20/2015  CLINICAL DATA:  Hypoxia EXAM: PORTABLE CHEST 1 VIEW COMPARISON:  Chest CT obtained earlier in the day; chest radiograph May 19, 2015 FINDINGS: Endotracheal tube tip is 4.6 cm above the carina. Nasogastric tube tip and side port are below the diaphragm. No pneumothorax. There is now complete opacification of the right hemi thorax, felt to be due to a combination of effusion and consolidation. Left lung is clear. Heart size is within normal limits. There is atherosclerotic change in the aorta. Mass lesions seen on CT are obscured on the right by the diffuse consolidation and effusion. IMPRESSION: Tube positions as described without pneumothorax. Left lung clear. Complete opacification on the right. Cardiac silhouette within normal limits. Atherosclerotic calcification noted. Electronically Signed   By: Lowella Grip III M.D.   On: 05/20/2015 13:18   Dg Chest Portable 1 View  05/18/2015  CLINICAL DATA:  69 year old female with increasing shortness of breath Coll the today. EXAM: PORTABLE CHEST 1 VIEW COMPARISON:  Chest x-ray 04/24/2015. FINDINGS: Enlarging right pleural effusion. Extensive opacities throughout the right mid to lower lung, which may simply reflect passive atelectasis, however, underlying airspace consolidation or underlying mass is not excluded. Persistent prominence of right paratracheal soft tissue highly concerning for lymphadenopathy. Left lung appears relatively clear, although the left base is poorly visualized medially (likely technique related). No evidence of pulmonary edema. Heart size is normal. Atherosclerotic calcifications in the thoracic aorta. IMPRESSION: 1. Overall, there has been significant progression compared to the prior chest x-ray from 04/24/2015, with  enlarging now a large right pleural effusion and worsening aeration throughout the right lung. This is unusual in the setting of a treated pneumonia, and given the presence of paratracheal soft tissue thickening on the right which is suspicious for lymphadenopathy, underlying neoplasm is strongly suspected. Further evaluation with contrast enhanced chest CT in is strongly recommended in the near future to evaluate for underlying neoplasm. 2. Atherosclerosis. Electronically Signed   By: Vinnie Langton M.D.   On: 05/03/2015 01:27   Dg Abd Portable 1v  05/20/2015  CLINICAL DATA:  OG an indentation. EXAM: PORTABLE ABDOMEN - 1 VIEW COMPARISON:  None. FINDINGS: Enteric tube tip is adequately positioned in the body of the stomach. Mildly prominent gas-filled loops of small bowel are noted within the right lower quadrant. Overall bowel gas pattern is indeterminate. No evidence of free intraperitoneal air seen. Large dense opacity noted at the right lung base. IMPRESSION: 1. OG tube appears adequately positioned with tip in the region of the stomach body. 2. Large dense opacity at the right lung base, incompletely imaged, which could be consolidation or effusion. Electronically Signed   By: Franki Cabot M.D.   On: 05/20/2015 13:19    ASSESSMENT:  Small cell lung cancer.   PLAN:    1. Small cell lung cancer: Patient is likely stage IV given her suspicious adrenal lesion, but full staging workup cannot be completed given her acute illness. Patient has had multiple failed attempts at extubation likely secondary to external compression of her mass. Will plan to initiate chemotherapy today in hopes of shrinking the mass down in order to extubate. Proceed with cycle 1, day 1 of cisplatin and etoposide today. Patient will receive etoposide only tomorrow and Wednesday. PICC line is being placed today in order to proceed with chemotherapy, but patient ultimately will require port placement once she is extubated. XRT  would be helpful, but there are patient safety concerns transporting to radiation oncology.   2. Thrombocytopenia: Unclear etiology, but most likely multifactorial secondary to acute illness. Proceed with chemotherapy as above cautiously and check daily CBC.   Will follow.   Lloyd Huger, MD   05/26/2015 12:52 PM

## 2015-05-26 NOTE — Progress Notes (Signed)
Received cisplatin and etoposide today via femoral line. tol well. Vss. Diuresing well via foley

## 2015-05-26 NOTE — Progress Notes (Signed)
Nutrition Follow-up     INTERVENTION:   EN: recommend continuing TF at current goal rate as per order set; noted free water flushes increased today per MD   NUTRITION DIAGNOSIS:   Inadequate oral intake related to acute illness as evidenced by NPO status.  GOAL:   Provide needs based on ASPEN/SCCM guidelines  MONITOR:    (Energy Intake, Anthropometrics, Electrolyte/Renal Profile, Digestive System, Electrolyte/Renal Profile)  REASON FOR ASSESSMENT:   Consult Enteral/tube feeding initiation and management  ASSESSMENT:    Pt remains on vent, noted plan for initiaiton of chemo today  EN: Vital HIgh Protein infusing at 50 ml/hr      Digestive System: no signs of TF intolerance  Electrolyte and Renal Profile:  Recent Labs Lab 05/23/15 0551 05/24/15 0510 05/25/15 0503 05/26/15 0456  BUN 49* 52* 58* 65*  CREATININE 1.30* 1.24* 1.07* 1.12*  NA 142 148* 147* 150*  K 3.8 4.6 4.1 3.9  MG 1.9  --  1.9 1.9  PHOS 4.1  --  3.7 4.0   Glucose Profile:  Recent Labs  05/26/15 0347 05/26/15 0732 05/26/15 1132  GLUCAP 181* 202* 252*   Meds: precedex, ss novolog, novolog, solumedrol  Height:   Ht Readings from Last 1 Encounters:  05/18/2015 '5\' 7"'$  (1.702 m)    Weight:   Wt Readings from Last 1 Encounters:  05/26/15 238 lb 1.6 oz (108 kg)    Filed Weights   05/24/15 0500 05/25/15 0351 05/26/15 0400  Weight: 244 lb 4.3 oz (110.8 kg) 241 lb 6.5 oz (109.5 kg) 238 lb 1.6 oz (108 kg)    BMI:  Body mass index is 37.28 kg/(m^2).  Estimated Nutritional Needs:   Kcal:  8616-8372 kcals (11-14 kcals/kg) using current wt of 105.6 kg  Protein:  92-122 g (1.5-2.0 g/kg)   Fluid:  1525-1830 mL (25-30 ml/kg)   EDUCATION NEEDS:   No education needs identified at this time  [place], Crocker, LDN [phone] Pager

## 2015-05-26 NOTE — Consult Note (Signed)
[facility] Consultation      Name: Emily Pratt MRN: [id number] DOB: 12/11/1945    ADMISSION DATE:  05/25/2015  CHIEF COMPLAINT:  Lethargic and resp distress   HISTORY OF PRESENT ILLNESS 05/15/2015  69 yo obese white female admitted to ICU for acute resp failure  The patient presents via EMS complaining of shortness of breath. She reportedly had oxygen saturations as low as 70% when EMS arrived. The patient was on BiPAP at that time. FiO2 was increased to 100% and oxygen saturations improved to only 79%. In the emergency department patient was immediately initiated on BiPAP. She had aggressively become more somnolent.    Subjective: Patient transition to PSV this morning tolerated about 2-1/2 hours, try to lower settings on PSV, patient became tachypneic and desatted down to lower 80s.    SIGNIFICANT EVENTS  CT chest R lung mass, intubated on 10/25, bronch with biopsy, overnight with some tachycardia but spontaneously resolved. CXR 10/31 am reviewed, no significant change.    ICU admission 10/24 10/25>>CT Chest with R lung mass >>intubated 10/25>>bronchoscopy with forceps biopsy of RMS and RUL carina (forceps and FNA)>> small cell   SUBJ: RASS -1, + F/C. Failed attempts at PSV weaning  Review of Systems  Unable to perform ROS: critical illness      VITAL SIGNS    Temp:  [97.7 F (36.5 C)-98.3 F (36.8 C)] 97.7 F (36.5 C) (10/31 0756) Pulse Rate:  [78-95] 85 (10/31 0700) Resp:  [14-26] 19 (10/31 0700) BP: (127-175)/(68-96) 169/79 mmHg (10/31 0700) SpO2:  [88 %-93 %] 89 % (10/31 0811) FiO2 (%):  [28 %] 28 % (10/31 0813) Weight:  [238 lb 1.6 oz (108 kg)] 238 lb 1.6 oz (108 kg) (10/31 0400) HEMODYNAMICS:   VENTILATOR SETTINGS: Vent Mode:  [-] PRVC FiO2 (%):  [28 %] 28 % Set Rate:  [14 bmp-19 bmp] 14 bmp Vt Set:  [500 mL] 500 mL PEEP:  [5 cmH20] 5 cmH20 INTAKE / OUTPUT:  Intake/Output Summary (Last 24 hours) at 05/26/15 0836 Last  data filed at 05/26/15 0700  Gross per 24 hour  Intake 3032.8 ml  Output   3030 ml  Net    2.8 ml       PHYSICAL EXAM   Physical Exam  Cardiovascular: Normal rate, regular rhythm and normal heart sounds.   No murmur heard. Pulmonary/Chest: No respiratory distress. She has no wheezes.  Intubated. Shallow BS at the bases, dec BS at on the right, mild coarse upper airway sounds.   Abdominal: Soft. Bowel sounds are normal. She exhibits no distension. There is no tenderness.  Musculoskeletal: She exhibits no edema.  Neurological:  RASS -1. No focal deficits  Skin: Skin is warm.  Nursing note and vitals reviewed.      LABS    Recent Labs Lab 05/24/15 0510 05/25/15 0503 05/26/15 0456  WBC 3.4* 5.0 5.9  HGB 13.7 13.7 13.7  HCT 41.9 41.8 41.4  PLT 66* 69* 72*   Coag's  Recent Labs Lab 05/22/15 1217  APTT 25  INR 1.04   BMET  Recent Labs Lab 05/24/15 0510 05/25/15 0503 05/26/15 0456  NA 148* 147* 150*  K 4.6 4.1 3.9  CL 108 110 112*  CO2 '31 29 30  '$ BUN 52* 58* 65*  CREATININE 1.24* 1.07* 1.12*  GLUCOSE 208* 164* 195*   Electrolytes  Recent Labs Lab 05/23/15 0551 05/24/15 0510 05/25/15 0503 05/26/15 0456  CALCIUM 8.3* 8.4* 8.5* 8.7*  MG 1.9  --  1.9 1.9  PHOS 4.1  --  3.7 4.0   Sepsis Markers No results for input(s): LATICACIDVEN, PROCALCITON, O2SATVEN in the last 168 hours. ABG  Recent Labs Lab 05/20/15 0935 05/20/15 1430 05/21/15 0845  PHART 7.45 7.37 7.42  PCO2ART 46 59* 52*  PO2ART 62* 62* 79*   Liver Enzymes  Recent Labs Lab 05/25/15 0503 05/26/15 0456  AST 33 38  ALT 30 41  ALKPHOS 42 44  BILITOT 0.6 0.7  ALBUMIN 2.7* 2.8*   Cardiac Enzymes No results for input(s): TROPONINI, PROBNP in the last 168 hours. Glucose  Recent Labs Lab 05/25/15 1127 05/25/15 1544 05/25/15 1949 05/26/15 0011 05/26/15 0347 05/26/15 0732  GLUCAP 199* 191* 223* 235* 181* 202*     Recent Results (from the past 240 hour(s))  MRSA PCR  Screening     Status: None   Collection Time: 05/25/2015 12:28 AM  Result Value Ref Range Status   MRSA by PCR NEGATIVE NEGATIVE Final    Comment:        The GeneXpert MRSA Assay (FDA approved for NASAL specimens only), is one component of a comprehensive MRSA colonization surveillance program. It is not intended to diagnose MRSA infection nor to guide or monitor treatment for MRSA infections.   Culture, blood (routine x 2)     Status: None   Collection Time: 04/30/2015  1:13 AM  Result Value Ref Range Status   Specimen Description BLOOD RIGHT HAND  Final   Special Requests BOTTLES DRAWN AEROBIC AND ANAEROBIC 3CC  Final   Culture NO GROWTH 5 DAYS  Final   Report Status 05/24/2015 FINAL  Final  Culture, blood (routine x 2)     Status: None   Collection Time: 05/06/2015  1:13 AM  Result Value Ref Range Status   Specimen Description BLOOD RIGHT ASSIST CONTROL  Final   Special Requests BOTTLES DRAWN AEROBIC AND ANAEROBIC 4CC  Final   Culture NO GROWTH 5 DAYS  Final   Report Status 05/24/2015 FINAL  Final  Culture, expectorated sputum-assessment     Status: None   Collection Time: 05/20/15  2:00 PM  Result Value Ref Range Status   Specimen Description EXPECTORATED SPUTUM  Final   Special Requests NONE  Final   Sputum evaluation THIS SPECIMEN IS ACCEPTABLE FOR SPUTUM CULTURE  Final   Report Status 05/20/2015 FINAL  Final  Culture, respiratory (NON-Expectorated)     Status: None   Collection Time: 05/20/15  2:00 PM  Result Value Ref Range Status   Specimen Description EXPECTORATED SPUTUM  Final   Special Requests NONE Reflexed from [id number]  Final   Gram Stain   Final    FEW WBC SEEN FEW GRAM POSITIVE RODS FAIR SPECIMEN - 70-80% WBCS    Culture APPEARS TO BE NORMAL FLORA  Final   Report Status 05/22/2015 FINAL  Final  Culture, bal-quantitative     Status: None   Collection Time: 05/20/15  5:05 PM  Result Value Ref Range Status   Specimen Description BRONCHIAL ALVEOLAR LAVAGE  Final    Special Requests Normal  Final   Gram Stain   Final    MODERATE WBC SEEN RARE GRAM POSITIVE RODS RARE GRAM POSITIVE COCCI IN CHAINS GOOD SPECIMEN - 80-90% WBCS    Culture APPEARS TO BE NORMAL FLORA  Final   Report Status 05/22/2015 FINAL  Final  Fungus Culture with Smear     Status: None (Preliminary result)   Collection Time: 05/20/15  5:05 PM  Result Value Ref  Range Status   Specimen Description SPUTUM  Final   Special Requests Normal  Final   Fungal Smear PENDING  Incomplete   Culture NO FUNGUS ISOLATED AFTER 2 DAYS  Final   Report Status PENDING  Incomplete     IMAGING   CXR: [facility]  INDWELLING DEVICES:: ETT 10/25 >>  R Fem CVL 10/25>>  MICRO DATA: MRSA PCR >>neg Blood 10/24 >> NEG BAL 10/25>> normal flora   ANTIMICROBIALS:  Zosyn/vancomycin 10/24>> 10/30 Levofloxacin 10/30 >>     ASSESSMENT/PLAN  Acute Resp Failure - ventilator dependence R lung collapse Drowned R lung New diagnosis of metastatic small cell ca of lung Thrombocytopenia - critical illness, avoid heparins Suspected post obstructive PNA DM 2 CKD  Cont vent support - settings reviewed and/or adjusted Cont vent bundle Daily SBT if/when meets criteria Monitor BMET intermittently Monitor I/Os Correct electrolytes as indicated Monitor temp, WBC count Micro and abx as above DVT px: SQ heparin Monitor CBC intermittently Transfuse per usual ICU guidelines  Will work towards extubation if can be done safely. Regardless, will initiate chem and XRT Mon 10/31  CCM time: 30 mins The above time includes time spent in consultation with patient and/or family members and reviewing care plan on multidisciplinary rounds  Marda Stalker, M.D.

## 2015-05-26 NOTE — Progress Notes (Signed)
[facility] [place] NAME: Emily Pratt    MR#:  [id number]  DATE OF BIRTH:  18-Nov-1945  SUBJECTIVE:  CHIEF COMPLAINT:  Intubated ,off sedation, comfortable, patient would like to have TT placed in case it is needed. D/w DR Grayland Ormond, chemotherapy will be initiated today.   REVIEW OF SYSTEMS:   Unable to obtain  DRUG ALLERGIES:   Allergies  Allergen Reactions  . Atorvastatin Other (See Comments)    Does not remember why she had Intolerance to Lipitor.  . Fentanyl Itching  . Rosuvastatin Anxiety    Chest tigtness and feeling as if she had heartburn.    VITALS:  Blood pressure 164/97, pulse 87, temperature 97.7 F (36.5 C), temperature source Axillary, resp. rate 22, height '5\' 7"'$  (1.702 m), weight 108 kg (238 lb 1.6 oz), SpO2 89 %.  PHYSICAL EXAMINATION:  GENERAL:  69 y.o.-year-old patient lying in the bed, intubated, alert, communicates, comfortable EYES: Pupils equal, round, reactive to light and accommodation. No scleral icterus.  HEENT: Head atraumatic, normocephalic. Oropharynx and nasopharynx clear. ETT in place NECK:  Supple, no jugular venous distention. No thyroid enlargement, no tenderness.  LUNGS: Markedly diminished breath sounds on the right , Scattered wheezes and rhonchi, coarse breath sounds on the right, good air movements on the left  CARDIOVASCULAR: S1, S2 normal. No murmurs, rubs, or gallops.  ABDOMEN: Soft, nontender, nondistended. Bowel sounds present. No organomegaly or mass.  EXTREMITIES: +1 pedal edema, cyanosis, or clubbing.  NEUROLOGIC: CN 2-12 intact, strength 5/5, sedated, does not follow commands PSYCHIATRIC: Somnolent SKIN: Sacral decubitus ulcer   LABORATORY PANEL:   CBC  Recent Labs Lab 05/26/15 0456  WBC 5.9  HGB 13.7  HCT 41.4  PLT 72*   ------------------------------------------------------------------------------------------------------------------  Chemistries   Recent  Labs Lab 05/26/15 0456  NA 150*  K 3.9  CL 112*  CO2 30  GLUCOSE 195*  BUN 65*  CREATININE 1.12*  CALCIUM 8.7*  MG 1.9  AST 38  ALT 41  ALKPHOS 44  BILITOT 0.7   ------------------------------------------------------------------------------------------------------------------  Cardiac Enzymes No results for input(s): TROPONINI in the last 168 hours. ------------------------------------------------------------------------------------------------------------------  RADIOLOGY:  Dg Chest Port 1 View  05/26/2015  CLINICAL DATA:  Respiratory failure. EXAM: PORTABLE CHEST 1 VIEW COMPARISON:  05/25/2015 .  CT 05/20/2015. FINDINGS: Endotracheal tube and NG tube noted in stable position. Interim increase consolidation in the right upper lobe. Persistent right pleural effusion. Left lung is clear. Stable cardiomegaly. No pneumothorax. No acute osseus abnormality P IMPRESSION: Interim increase in consolidation of the right upper lobe with persistent right pleural effusion. Persistent occlusion of the right mainstem bronchus appears to be present. Electronically Signed   By: Udell   On: 05/26/2015 07:13   Dg Chest Port 1 View  05/25/2015  CLINICAL DATA:  Respiratory failure. EXAM: PORTABLE CHEST 1 VIEW COMPARISON:  05/21/2015 FINDINGS: The ET tube tip is above the carina. The nasogastric tube tip is below the GE junction. Normal heart size. Aortic atherosclerosis. Partially loculated right pleural effusion is again identified and appears unchanged from the previous exam. Left lung is clear. IMPRESSION: 1. Loculated right pleural effusion is stable. 2. Left lung is clear. Electronically Signed   By: Kerby Moors M.D.   On: 05/25/2015 09:03    EKG:   Orders placed or performed during the hospital encounter of 05/09/2015  . EKG 12-Lead  . EKG 12-Lead    ASSESSMENT AND PLAN:   1. Acute on  chronic respiratory failure with hypoxia and Hypercapnia,  - Multifactorial due to large  right-sided pleural effusion/ CHF/ COPD / mass- SCLC - Pulmonology following - Continue nebulizer, steroids, antibiotics. Oncology is starting chemotherapy today with cisplatin and etoposide with hopes to shrink the mass. Timing of shrinkage  is unclear. Getting palliative care involved to follow along, patient may need TT placement if extubation remains difficult /prolonged  2. 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 Acute on chronic kidney disease:  - Due to ATN, hypovolemia and hypotension - stable  - Nephrology signed off. Patient is going to be started on losartan now. Following kidney function closely  4. Essential hypertension:  - , continue metoprolol and losartan, add norvasc  5. Diabetes mellitus type 2: Hold home medications Provide sliding scale insulin, blood glucose is ranging around 200, now on NovoLog RTC to cover tube feeds  6. Lung mass: Small cell lung cancer, likely stage IV per oncology - Status post bronchoscopy and 10/25, pathology show small cell lung cancer - Long-term history of heavy smoking - pathology reports were discussed with the patient and sister.   - Oncology is  initiating chemotherapy today,  PICC line for chemotherapy is to be placed- Radiation oncology has been consulted and patient may benefit from radiology oncology intervention as well, since she is intubated. It might be difficult to perform  7. Postobstructive pneumonia - Blood cultures negative to date, respiratory, BAL, AFB normal flora - Discontinue vancomycin and Zosyn, completed 7 day Levaquin course  8. Arrhythmia, SVT versus paroxysmal atrial fibrillation during bronchoscopy, now in sinus rhythm, continue metoprolol per cardiology's recommendations  9 thrombocytopenia, following closely, may need transfusions with  chemotherapy for lung ca  All the records are reviewed and case discussed with the patient's husband and sister at the bedside.  CODE STATUS: Full code  TOTAL CRITICAL CARE TIME TAKING CARE OF THIS PATIENT: 50 minutes.  coordination of care 15-20  minutes POSSIBLE D/C IN ?  DAYS, DEPENDING ON CLINICAL CONDITION.   Theodoro Grist M.D on 05/26/2015 at 11:13 AM  Between 7am to 6pm - Pager - [phone] After 6pm go to [email or web address] - password [email or web address] [facility]  Office  [phone]  CC: Primary care physician; Dagoberto Ligas, MD

## 2015-05-26 NOTE — Progress Notes (Signed)
Inpatient Diabetes Program Recommendations  AACE/ADA: New Consensus Statement on Inpatient Glycemic Control (2015)  Target Ranges:  Prepandial:   less than 140 mg/dL      Peak postprandial:   less than 180 mg/dL (1-2 hours)      Critically ill patients:  140 - 180 mg/dL   Review of Glycemic Control:  Results for BERNA, GITTO (MRN [id number]) as of 05/26/2015 10:31  Ref. Range 05/25/2015 15:44 05/25/2015 19:49 05/26/2015 00:11 05/26/2015 03:47 05/26/2015 07:32  Glucose-Capillary Latest Ref Range: 65-99 mg/dL 191 (H) 223 (H) 235 (H) 181 (H) 202 (H)    Diabetes history: Type 2 diabetes Current orders for Inpatient glycemic control:  Novolog moderate q 4 hours,  Inpatient Diabetes Program Recommendations:    May consider adding Novolog tube feed coverage 3 units q 4 hours to cover CHO in tube feeds.  Thanks, Adah Perl, RN, BC-ADM Inpatient Diabetes Coordinator Pager [phone] (8a-5p)

## 2015-05-26 NOTE — Progress Notes (Signed)
PICC lin placed by PICC team and RN with PICC team looked at chest x-ray and says PICC may be used. I called and notified Dr. Ether Griffins. Chemotherapy is currently infusing through Central line.

## 2015-05-26 NOTE — Progress Notes (Signed)
Pharmacy Consult for Electrolyte Monitoring   Allergies  Allergen Reactions  . Atorvastatin Other (See Comments)    Does not remember why she had Intolerance to Lipitor.  . Fentanyl Itching  . Rosuvastatin Anxiety    Chest tigtness and feeling as if she had heartburn.    Patient Measurements: Height: '5\' 7"'$  (170.2 cm) Weight: 238 lb 1.6 oz (108 kg) IBW/kg (Calculated) : 61.6   Vital Signs: Temp: 97.7 F (36.5 C) (10/31 1110) Temp Source: Axillary (10/31 1110) BP: 164/97 mmHg (10/31 0800) Pulse Rate: 87 (10/31 0800) Intake/Output from previous day: 10/30 0701 - 10/31 0700 In: 3259.2 [I.V.:629.2; NG/GT:2580] Out: 3030 [Urine:2680; Stool:350] Intake/Output from this shift: Total I/O In: 76.4 [I.V.:26.4; NG/GT:50] Out: 850 [Urine:850]  Labs:  Recent Labs  05/24/15 0510 05/25/15 0503 05/26/15 0456  WBC 3.4* 5.0 5.9  HGB 13.7 13.7 13.7  HCT 41.9 41.8 41.4  PLT 66* 69* 72*     Recent Labs  05/24/15 0510 05/25/15 0503 05/26/15 0456  NA 148* 147* 150*  K 4.6 4.1 3.9  CL 108 110 112*  CO2 '31 29 30  '$ GLUCOSE 208* 164* 195*  BUN 52* 58* 65*  CREATININE 1.24* 1.07* 1.12*  CALCIUM 8.4* 8.5* 8.7*  MG  --  1.9 1.9  PHOS  --  3.7 4.0  PROT  --  5.6* 5.7*  ALBUMIN  --  2.7* 2.8*  AST  --  33 38  ALT  --  30 41  ALKPHOS  --  42 44  BILITOT  --  0.6 0.7   Estimated Creatinine Clearance: 61.7 mL/min (by C-G formula based on Cr of 1.12).    Recent Labs  05/26/15 0347 05/26/15 0732 05/26/15 1132  GLUCAP 181* 202* 252*    Medical History: Past Medical History  Diagnosis Date  . Anxiety   . Arthritis   . COPD (chronic obstructive pulmonary disease) ([place])   . CHF (congestive heart failure) ([place])   . Hyperlipidemia   . Hypertension   . Diabetes mellitus without complication ([place])   . Chronic kidney disease   . Neuromuscular disorder ([place])     Medications:  Scheduled:  . sodium chloride   Intravenous Once  . amLODipine  5 mg Oral Daily  . antiseptic  oral rinse  7 mL Mouth Rinse QID  . budesonide (PULMICORT) nebulizer solution  0.5 mg Nebulization BID  . chlorhexidine gluconate  15 mL Mouth Rinse BID  . CISplatin  180 mg Intravenous Once  . colchicine  0.6 mg Per Tube Daily  . D5-1/2NS + KCL + MG +/- MANNITOL IV CISplatin hydration   Intravenous Once  . etoposide  80 mg/m2 (Treatment Plan Actual) Intravenous Once  . famotidine  20 mg Per Tube BID  . fosaprepitant (EMEND) 150 mg + dexamethasone IV infusion   Intravenous Once  . free water  250 mL Per Tube Q4H  . insulin aspart  0-15 Units Subcutaneous 6 times per day  . insulin aspart  3 Units Subcutaneous Q4H  . ipratropium-albuterol  3 mL Nebulization Q6H  . levofloxacin  500 mg Per Tube Daily  . linagliptin  5 mg Per Tube Daily  . losartan  100 mg Oral Daily  . metoprolol  100 mg Per Tube BID  . palonosetron  0.25 mg Intravenous Once  . [START ON 05/27/2015] predniSONE  40 mg Oral Q breakfast   Followed by  . [START ON 05/28/2015] predniSONE  30 mg Oral Q breakfast   Followed by  . [  START ON 05/29/2015] predniSONE  20 mg Oral Q breakfast   Followed by  . [START ON 05/30/2015] predniSONE  10 mg Oral Q breakfast  . pregabalin  50 mg Per Tube TID   Infusions:  . dexmedetomidine 1.1 mcg/kg/hr (05/26/15 1018)  . feeding supplement (VITAL HIGH PROTEIN) 1,000 mL (05/24/15 2101)    Assessment: Electrolytes remain WNL.  Plan:  No supplementation warranted at this time.  Will reorder levels with AM labs. Pharmacy will continue to monitor labs and replace electrolytes as needed.   Nancy Fetter, PharmD Pharmacy Resident

## 2015-05-27 DIAGNOSIS — J9620 Acute and chronic respiratory failure, unspecified whether with hypoxia or hypercapnia: Secondary | ICD-10-CM

## 2015-05-27 DIAGNOSIS — Z515 Encounter for palliative care: Secondary | ICD-10-CM

## 2015-05-27 DIAGNOSIS — Z79899 Other long term (current) drug therapy: Secondary | ICD-10-CM

## 2015-05-27 LAB — BASIC METABOLIC PANEL
Anion gap: 7 (ref 5–15)
BUN: 61 mg/dL — AB (ref 6–20)
CALCIUM: 8.1 mg/dL — AB (ref 8.9–10.3)
CO2: 28 mmol/L (ref 22–32)
CREATININE: 0.96 mg/dL (ref 0.44–1.00)
Chloride: 115 mmol/L — ABNORMAL HIGH (ref 101–111)
GFR calc non Af Amer: 60 mL/min — ABNORMAL LOW (ref >60)
Glucose, Bld: 188 mg/dL — ABNORMAL HIGH (ref 65–99)
Potassium: 3.8 mmol/L (ref 3.5–5.1)
SODIUM: 150 mmol/L — AB (ref 135–145)

## 2015-05-27 LAB — COMPREHENSIVE METABOLIC PANEL
ALT: 49 U/L (ref 14–54)
AST: 35 U/L (ref 15–41)
Albumin: 2.6 g/dL — ABNORMAL LOW (ref 3.5–5.0)
Alkaline Phosphatase: 43 U/L (ref 38–126)
Anion gap: 7 (ref 5–15)
BILIRUBIN TOTAL: 0.8 mg/dL (ref 0.3–1.2)
BUN: 67 mg/dL — ABNORMAL HIGH (ref 6–20)
CHLORIDE: 116 mmol/L — AB (ref 101–111)
CO2: 28 mmol/L (ref 22–32)
CREATININE: 0.97 mg/dL (ref 0.44–1.00)
Calcium: 8.3 mg/dL — ABNORMAL LOW (ref 8.9–10.3)
GFR, EST NON AFRICAN AMERICAN: 59 mL/min — AB (ref >60)
Glucose, Bld: 233 mg/dL — ABNORMAL HIGH (ref 65–99)
Potassium: 3.9 mmol/L (ref 3.5–5.1)
Sodium: 151 mmol/L — ABNORMAL HIGH (ref 135–145)
TOTAL PROTEIN: 5.3 g/dL — AB (ref 6.5–8.1)

## 2015-05-27 LAB — MAGNESIUM: Magnesium: 2 mg/dL (ref 1.7–2.4)

## 2015-05-27 LAB — GLUCOSE, CAPILLARY
Glucose-Capillary: 167 mg/dL — ABNORMAL HIGH (ref 65–99)
Glucose-Capillary: 188 mg/dL — ABNORMAL HIGH (ref 65–99)
Glucose-Capillary: 234 mg/dL — ABNORMAL HIGH (ref 65–99)
Glucose-Capillary: 208 mg/dL — ABNORMAL HIGH (ref 65–99)
Glucose-Capillary: 221 mg/dL — ABNORMAL HIGH (ref 65–99)

## 2015-05-27 LAB — CBC
HCT: 39.7 % (ref 35.0–47.0)
Hemoglobin: 12.6 g/dL (ref 12.0–16.0)
MCH: 29.6 pg (ref 26.0–34.0)
MCHC: 31.6 g/dL — AB (ref 32.0–36.0)
MCV: 93.6 fL (ref 80.0–100.0)
Platelets: 68 10*3/uL — ABNORMAL LOW (ref 150–440)
RBC: 4.24 MIL/uL (ref 3.80–5.20)
RDW: 18.5 % — AB (ref 11.5–14.5)
WBC: 5 10*3/uL (ref 3.6–11.0)

## 2015-05-27 LAB — PHOSPHORUS: Phosphorus: 4 mg/dL (ref 2.5–4.6)

## 2015-05-27 MED ORDER — VITAL HIGH PROTEIN PO LIQD
1000.0000 mL | ORAL | Status: DC
  Administered 2015-05-28 – 2015-05-30 (×3): 1000 mL

## 2015-05-27 MED ORDER — HEPARIN SODIUM (PORCINE) 5000 UNIT/ML IJ SOLN
5000.0000 [IU] | Freq: Two times a day (BID) | INTRAMUSCULAR | Status: DC
  Administered 2015-05-27 – 2015-05-31 (×9): 5000 [IU] via SUBCUTANEOUS
  Filled 2015-05-27 (×9): qty 1

## 2015-05-27 MED ORDER — SODIUM CHLORIDE 0.9 % IV SOLN
80.0000 mg/m2 | Freq: Once | INTRAVENOUS | Status: AC
  Administered 2015-05-27: 180 mg via INTRAVENOUS
  Filled 2015-05-27: qty 9

## 2015-05-27 MED ORDER — SODIUM CHLORIDE 0.9 % IV SOLN
10.0000 mg | Freq: Once | INTRAVENOUS | Status: AC
  Administered 2015-05-27: 10 mg via INTRAVENOUS
  Filled 2015-05-27: qty 1

## 2015-05-27 MED ORDER — SODIUM CHLORIDE 0.9 % IV SOLN
Freq: Once | INTRAVENOUS | Status: AC
  Administered 2015-05-27: 14:00:00 via INTRAVENOUS

## 2015-05-27 NOTE — Consult Note (Addendum)
Palliative Medicine Inpatient Consult Note   Name: Emily Pratt Date: 05/27/2015 MRN: [id number]  DOB: 1945/09/09  Referring Physician: Theodoro Grist, MD  Palliative Care consult requested for this 69 y.o. 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  I was asked to talk to pt about trache and code status.  She has already indicated she will agree to a trache if needed.    ASSESSMENT AND PLAN: I spoke with the patient who was somewhat anxious b/c she had had several people talking with her just prior to my visit about chemo, trache, and updates on her condition. Chemo had started also and nurse was in the room explaining this to pt. Despite this activity, she was alert and able to nod appropriately. She indicated a confirmation that she would want a trache placed and that she understands why this is done, etc.  Unfortunately, she does not seem to know anything about code status. She nodded appropriately when I spoke about this, but seemed to indicate that she did not understand and that she hadn't had anyone talk to her about this before.    I will follow along and attempt to talk with her (as she has capacity to make decisions) and also talk with family.  There is no rush to discuss DNR, but it should be addressed reasonably soon, though the biggest decision was about consent for a trach.    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  SOCIAL HISTORY:  reports that she has been smoking Cigarettes.  She does not have any smokeless tobacco history on file. She  reports that she does not drink alcohol or use illicit drugs.  LEGAL DOCUMENTS:    CODE STATUS: Full code    PAST MEDICAL HISTORY: Past Medical History  Diagnosis Date  . Anxiety   . Arthritis   . COPD (chronic obstructive pulmonary disease) ([place])   . CHF (congestive heart failure) ([place])   . Hyperlipidemia   . Hypertension   . Diabetes mellitus without complication ([place])   . Chronic kidney disease   . Neuromuscular disorder ([place])     PAST SURGICAL HISTORY:  Past Surgical History  Procedure Laterality Date  . Joint replacement Bilateral 2006    both knees  . Ankle surgery      ALLERGIES:  is allergic to atorvastatin; fentanyl; and rosuvastatin.  MEDICATIONS:  Current Facility-Administered Medications  Medication Dose Route Frequency Provider Last Rate Last Dose  . 0.9 %  sodium chloride infusion   Intravenous Once Lloyd Huger, MD      . acetaminophen (TYLENOL) tablet 650 mg  650 mg Oral Q6H PRN Harrie Foreman, MD   650 mg at 05/25/15 0630   Or  . acetaminophen (TYLENOL) suppository 650 mg  650 mg Rectal Q6H PRN Harrie Foreman, MD      . amLODipine (NORVASC) tablet 5 mg  5 mg Oral Daily Theodoro Grist, MD   5 mg at 05/27/15 0942  . antiseptic oral rinse solution (CORINZ)  7 mL Mouth Rinse QID Vishal Mungal, MD   7 mL at 05/27/15  0400  . budesonide (PULMICORT) nebulizer solution 0.5 mg  0.5 mg Nebulization BID Flora Lipps, MD   0.5 mg at 05/27/15 0801  . chlorhexidine gluconate (PERIDEX) 0.12 % solution 15 mL  15 mL Mouth Rinse BID Vishal Mungal, MD   15 mL at 05/27/15 0626  . colchicine tablet 0.6 mg  0.6 mg Per Tube Daily Wilhelmina Mcardle, MD   0.6 mg at 05/27/15 1025  . dexamethasone (DECADRON) 10 mg in sodium chloride 0.9 % 50 mL IVPB  10 mg Intravenous Once Lloyd Huger, MD      . dexmedetomidine (PRECEDEX) 400 MCG/100ML (4 mcg/mL) infusion  0-1.2 mcg/kg/hr Intravenous Continuous Wilhelmina Mcardle, MD 29 mL/hr at 05/27/15 0934 1.1 mcg/kg/hr at 05/27/15  0934  . etoposide (VEPESID) 180 mg in sodium chloride 0.9 % 500 mL chemo infusion  80 mg/m2 (Treatment Plan Actual) Intravenous Once Lloyd Huger, MD      . famotidine (PEPCID) tablet 20 mg  20 mg Per Tube BID Theodoro Grist, MD   20 mg at 05/27/15 0938  . feeding supplement (VITAL HIGH PROTEIN) liquid 1,000 mL  1,000 mL Per Tube Continuous Vishal Mungal, MD 50 mL/hr at 05/26/15 1728 1,000 mL at 05/26/15 1728  . fentaNYL (SUBLIMAZE) injection 25-100 mcg  25-100 mcg Intravenous Q2H PRN Wilhelmina Mcardle, MD   100 mcg at 05/27/15 0029  . free water 250 mL  250 mL Per Tube Q4H Laverle Hobby, MD   250 mL at 05/27/15 0745  . insulin aspart (novoLOG) injection 0-15 Units  0-15 Units Subcutaneous 6 times per day Wilhelmina Mcardle, MD   3 Units at 05/27/15 0810  . insulin aspart (novoLOG) injection 3 Units  3 Units Subcutaneous Q4H Theodoro Grist, MD   3 Units at 05/27/15 [phone]  . ipratropium-albuterol (DUONEB) 0.5-2.5 (3) MG/3ML nebulizer solution 3 mL  3 mL Nebulization Q6H Vishal Mungal, MD   3 mL at 05/27/15 0801  . linagliptin (TRADJENTA) tablet 5 mg  5 mg Per Tube Daily Wilhelmina Mcardle, MD   5 mg at 05/27/15 0939  . losartan (COZAAR) tablet 100 mg  100 mg Oral Daily Theodoro Grist, MD   100 mg at 05/27/15 0938  . metoprolol (LOPRESSOR) tablet 100 mg  100 mg Per Tube BID Wilhelmina Mcardle, MD   100 mg at 05/27/15 0942  . midazolam (VERSED) injection 1-2 mg  1-2 mg Intravenous Q2H PRN Wilhelmina Mcardle, MD   2 mg at 05/27/15 0815  . ondansetron (ZOFRAN) tablet 4 mg  4 mg Oral Q6H PRN Harrie Foreman, MD       Or  . ondansetron [facility]) injection 4 mg  4 mg Intravenous Q6H PRN Harrie Foreman, MD      . Derrill Memo ON 05/28/2015] predniSONE (DELTASONE) tablet 30 mg  30 mg Oral Q breakfast Laverle Hobby, MD       Followed by  . [START ON 05/29/2015] predniSONE (DELTASONE) tablet 20 mg  20 mg Oral Q breakfast Laverle Hobby, MD       Followed by  . [START ON 05/30/2015] predniSONE (DELTASONE)  tablet 10 mg  10 mg Oral Q breakfast Laverle Hobby, MD      . pregabalin (LYRICA) capsule 50 mg  50 mg Per Tube TID Wilhelmina Mcardle, MD   50 mg at 05/27/15 0938    Vital Signs: BP 141/69 mmHg  Pulse 75  Temp(Src) 97.8 F (36.6 C) (Axillary)  Resp 16  Ht '5\' 7"'$  (  1.702 m)  Wt 111.1 kg (244 lb 14.9 oz)  BMI 38.35 kg/m2  SpO2 90% Filed Weights   05/25/15 0351 05/26/15 0400 05/27/15 0601  Weight: 109.5 kg (241 lb 6.5 oz) 108 kg (238 lb 1.6 oz) 111.1 kg (244 lb 14.9 oz)    Estimated body mass index is 38.35 kg/(m^2) as calculated from the following:   Height as of this encounter: '5\' 7"'$  (1.702 m).   Weight as of this encounter: 111.1 kg (244 lb 14.9 oz).  PERFORMANCE STATUS (ECOG) : 4 - Bedbound  PHYSICAL EXAM: Anxious Still intubated but alert Able to nod appropriately EOMI OP clear Neck w/o JVD or TM Hrt rrr no mgr Lungs cta Abd soft and NT Skin warm and dry   LABS: CBC:    Component Value Date/Time   WBC 5.9 05/26/2015 0456   HGB 13.7 05/26/2015 0456   HCT 41.4 05/26/2015 0456   PLT 72* 05/26/2015 0456   MCV 92.3 05/26/2015 0456   NEUTROABS 4.1 04/24/2015 1722   LYMPHSABS 1.1 04/24/2015 1722   MONOABS 0.4 04/24/2015 1722   EOSABS 0.2 04/24/2015 1722   BASOSABS 0.0 04/24/2015 1722   Comprehensive Metabolic Panel:    Component Value Date/Time   NA 150* 05/27/2015 0500   NA 143 11/14/2014   K 3.8 05/27/2015 0500   CL 115* 05/27/2015 0500   CO2 28 05/27/2015 0500   BUN 61* 05/27/2015 0500   BUN 21 11/14/2014   CREATININE 0.96 05/27/2015 0500   CREATININE 17.0* 11/14/2014   GLUCOSE 188* 05/27/2015 0500   CALCIUM 8.1* 05/27/2015 0500   AST 38 05/26/2015 0456   ALT 41 05/26/2015 0456   ALKPHOS 44 05/26/2015 0456   BILITOT 0.7 05/26/2015 0456   PROT 5.7* 05/26/2015 0456   ALBUMIN 2.8* 05/26/2015 0456    More than 50% of the visit was spent in counseling/coordination of care: No:   Time Spent:  60 minutes

## 2015-05-27 NOTE — Progress Notes (Signed)
Palliative Medicine Inpatient Consult Follow Up Note   Name: Emily Pratt Date: 05/27/2015 MRN: [id number]  DOB: Jun 09, 1946  Referring Physician: Theodoro Grist, MD  Palliative Care consult requested for this 69 y.o. 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   Today's discussions and decisions: I spoke with Dr. Grayland Ormond, oncologist.  I updated him about my hope to discuss code status with pt. But yesterday was not a good time to do so --and it appears that today is not also.  There is no great urgency to discuss this with her, but it is important to bring up with pt (and her husband also) at some point soon.  Pt is due to get a trache. Dr Grayland Ormond agrees that it is not known how she will do with the current plan -and he feels my following along is appropriate.  I will follow at a distance until I can get a better moment to discuss code status with pt and/or her husband.  And, should she respond poorly to the current interventions, I will be available to discuss comfort care.     REVIEW OF SYSTEMS:  Patient is not able to provide ROS due to critical illness  CODE STATUS: Full code   PAST MEDICAL HISTORY: Past Medical History  Diagnosis Date  . Anxiety   . Arthritis   . COPD (chronic obstructive pulmonary disease) ([place])   . CHF (congestive heart failure) ([place])   . Hyperlipidemia   . Hypertension   . Diabetes mellitus without complication ([place])   . Chronic kidney disease   . Neuromuscular disorder ([place])     PAST SURGICAL HISTORY:  Past Surgical History  Procedure Laterality Date  . Joint replacement Bilateral 2006    both knees  . Ankle surgery      Vital Signs: BP 130/69 mmHg  Pulse 73  Temp(Src) 97.3 F (36.3 C) (Oral)  Resp 16  Ht '5\' 7"'$  (1.702 m)  Wt 111.1 kg (244 lb 14.9 oz)  BMI 38.35 kg/m2  SpO2 91% Filed Weights   05/25/15 0351 05/26/15 0400  05/27/15 0601  Weight: 109.5 kg (241 lb 6.5 oz) 108 kg (238 lb 1.6 oz) 111.1 kg (244 lb 14.9 oz)    Estimated body mass index is 38.35 kg/(m^2) as calculated from the following:   Height as of this encounter: '5\' 7"'$  (1.702 m).   Weight as of this encounter: 111.1 kg (244 lb 14.9 oz).  PHYSICAL EXAM: Intubated Sleeping No JVD or TM Hrt rrr no m Lungs ronchi, no wheezing heard Abd soft and NT Skin warm and dry  LABS: CBC:    Component Value Date/Time   WBC 5.0 05/27/2015 1457   HGB 12.6 05/27/2015 1457   HCT 39.7 05/27/2015 1457   PLT 68* 05/27/2015 1457   MCV 93.6 05/27/2015 1457   NEUTROABS 4.1 04/24/2015 1722   LYMPHSABS 1.1 04/24/2015 1722   MONOABS 0.4 04/24/2015 1722   EOSABS 0.2 04/24/2015 1722   BASOSABS 0.0 04/24/2015 1722   Comprehensive Metabolic Panel:    Component Value Date/Time   NA 151* 05/27/2015 1457   NA 143 11/14/2014   K 3.9 05/27/2015 1457   CL 116* 05/27/2015 1457   CO2 28 05/27/2015 1457   BUN 67* 05/27/2015 1457   BUN 21 11/14/2014   CREATININE 0.97 05/27/2015 1457   CREATININE 17.0* 11/14/2014   GLUCOSE 233* 05/27/2015 1457  CALCIUM 8.3* 05/27/2015 1457   AST 35 05/27/2015 1457   ALT 49 05/27/2015 1457   ALKPHOS 43 05/27/2015 1457   BILITOT 0.8 05/27/2015 1457   PROT 5.3* 05/27/2015 1457   ALBUMIN 2.6* 05/27/2015 1457     More than 50% of the visit was spent in counseling/coordination of care: YES  Time Spent: 15 min

## 2015-05-27 NOTE — Progress Notes (Signed)
Patient's husband at patient's beside visiting. Patient is very anxious and she has tears rolling down her face. Her husband got up and left the room. She requested medication for her nerves.

## 2015-05-27 NOTE — Progress Notes (Signed)
Patient placed on spontaneous mode on the ventilator. Her oxygen saturation decreased to 80%, she wrote a note to RN requesting something for her nerves, she is using her accessory muscles to breathe abdomen is distended. She was taken off spontaneous mode by RT and PRN versed given.

## 2015-05-27 NOTE — Progress Notes (Signed)
Right femoral central line removed per order. Pressure held and dressing applied

## 2015-05-27 NOTE — Progress Notes (Signed)
Nutrition Follow-up     INTERVENTION:   EN: recommend increasing TF to rate of 60 ml/hr (provides 1440 kcals, 126 g of protein, 1210 mL of free water). Hypernatremia discussed during ICU rounds with MD present.    NUTRITION DIAGNOSIS:   Inadequate oral intake related to acute illness as evidenced by NPO status.  GOAL:   Provide needs based on ASPEN/SCCM guidelines  MONITOR:    (Energy Intake, Anthropometrics, Electrolyte/Renal Profile, Digestive System, Electrolyte/Renal Profile)  REASON FOR ASSESSMENT:   Consult Enteral/tube feeding initiation and management  ASSESSMENT:    Pt remains on vent, started on chemo yesterday  EN: tolerating Vital High Protein at rate of 50 ml/hr  Digestive System: no signs of TF, +BM yesterday  Electrolyte and Renal Profile:  Recent Labs Lab 05/25/15 0503 05/26/15 0456 05/27/15 0450 05/27/15 0500  BUN 58* 65*  --  61*  CREATININE 1.07* 1.12*  --  0.96  NA 147* 150*  --  150*  K 4.1 3.9  --  3.8  MG 1.9 1.9 2.0  --   PHOS 3.7 4.0 4.0  --    Glucose Profile:  Recent Labs  05/27/15 0425 05/27/15 0730 05/27/15 1158  GLUCAP 167* 188* 234*   Meds: ss novolog, prednisone, decadron  Height:   Ht Readings from Last 1 Encounters:  05/06/2015 '5\' 7"'$  (1.702 m)    Weight:   Wt Readings from Last 1 Encounters:  05/27/15 244 lb 14.9 oz (111.1 kg)   Filed Weights   05/25/15 0351 05/26/15 0400 05/27/15 0601  Weight: 241 lb 6.5 oz (109.5 kg) 238 lb 1.6 oz (108 kg) 244 lb 14.9 oz (111.1 kg)    BMI:  Body mass index is 38.35 kg/(m^2).  Estimated Nutritional Needs:   Kcal:  6834-1962 kcals (11-14 kcals/kg) using current wt of 105.6 kg  Protein:  92-122 g (1.5-2.0 g/kg)   Fluid:  1525-1830 mL (25-30 ml/kg)   EDUCATION NEEDS:   No education needs identified at this time  [place], [place], LDN [phone] Pager

## 2015-05-27 NOTE — Progress Notes (Signed)
Patient in bed asleep; on ventilater; ET tube 23 at lip; O2 28%, PEEP 5, tidal volume 500, resp 14; Precedex at 1.1 mcg/ min thru right upper arm PICC; tube feeds running at 50; flush at 250 every 8 hrs

## 2015-05-27 NOTE — Progress Notes (Signed)
Inpatient Diabetes Program Recommendations  AACE/ADA: New Consensus Statement on Inpatient Glycemic Control (2015)  Target Ranges:  Prepandial:   less than 140 mg/dL      Peak postprandial:   less than 180 mg/dL (1-2 hours)      Critically ill patients:  140 - 180 mg/dL   Review of Glycemic Control:  Results for Emily Pratt, Emily Pratt (MRN [id number]) as of 05/27/2015 12:14  Ref. Range 05/26/2015 20:13 05/26/2015 23:51 05/27/2015 04:25 05/27/2015 07:30 05/27/2015 11:58  Glucose-Capillary Latest Ref Range: 65-99 mg/dL 246 (H) 242 (H) 167 (H) 188 (H) 234 (H)   Inpatient Diabetes Program Recommendations:     Note that CBG's better with tube feed coverage however still greater than goal.  May consider adding Levemir 15 units daily to current regimen.    Thanks, Adah Perl, RN, BC-ADM Inpatient Diabetes Coordinator Pager [phone] (8a-5p)

## 2015-05-27 NOTE — Consult Note (Signed)
Emily Pratt, [place] [id number] May 29, 1946  Reason for Consult:  Failure to extubate, lung cancer Requesting Physician:  Theodoro Grist, MD   HPI:  Patient intubated and sedated.  History reviewed in chart.  Lung disease with Ca and lung collapse.  Asked to place Trache.  ROS:  Negative except as in HPI.  Past Medical History  Diagnosis Date  . Anxiety   . Arthritis   . COPD (chronic obstructive pulmonary disease) ([place])   . CHF (congestive heart failure) ([place])   . Hyperlipidemia   . Hypertension   . Diabetes mellitus without complication ([place])   . Chronic kidney disease   . Neuromuscular disorder ([facility])     Allergies  Allergen Reactions  . Atorvastatin Other (See Comments)    Does not remember why she had Intolerance to Lipitor.  . Fentanyl Itching  . Rosuvastatin Anxiety    Chest tigtness and feeling as if she had heartburn.    Scheduled Medications: . amLODipine  5 mg Oral Daily  . antiseptic oral rinse  7 mL Mouth Rinse QID  . budesonide (PULMICORT) nebulizer solution  0.5 mg Nebulization BID  . chlorhexidine gluconate  15 mL Mouth Rinse BID  . colchicine  0.6 mg Per Tube Daily  . famotidine  20 mg Per Tube BID  . free water  250 mL Per Tube Q4H  . heparin subcutaneous  5,000 Units Subcutaneous Q12H  . insulin aspart  0-15 Units Subcutaneous 6 times per day  . insulin aspart  3 Units Subcutaneous Q4H  . ipratropium-albuterol  3 mL Nebulization Q6H  . linagliptin  5 mg Per Tube Daily  . losartan  100 mg Oral Daily  . metoprolol  100 mg Per Tube BID  . [START ON 05/28/2015] predniSONE  30 mg Oral Q breakfast   Followed by  . [START ON 05/29/2015] predniSONE  20 mg Oral Q breakfast   Followed by  . [START ON 05/30/2015] predniSONE  10 mg Oral Q breakfast  . pregabalin  50 mg Per Tube TID    PRN Meds: acetaminophen **OR** acetaminophen, fentaNYL (SUBLIMAZE) injection, midazolam, ondansetron **OR** ondansetron (ZOFRAN) IV  Infusions: . dexmedetomidine 1.1 mcg/kg/hr  (05/27/15 1529)  . feeding supplement (VITAL HIGH PROTEIN) 1,000 mL (05/27/15 1430)     Physical Exam:  Filed Vitals:   05/27/15 1600  BP: 139/69  Pulse: 73  Temp: 97.5 F (36.4 C)  Resp: 14    Patient intubated and sedated.  No anterior neck scars. Patient is obese with thick neck.    IMPRESSION:  Failure to extubate with lung cancer.  PLAN:   Agree with plan for trache, but patient has low platelets at present.  This would significantly increase the risk of bleeding.  Will discuss with Dr. Grayland Ormond.  Dr. Pryor Ochoa would be the next available ENT surgeon on Thursday if medically cleared for surgery.    Patient was seen and examined in the ICU.  20 minutes of time used to examine and review the patient.      Edras Wilford T 05/27/2015, 5:07 PM

## 2015-05-27 NOTE — Progress Notes (Signed)
[place]  [phone] [phone]  ID: Emily Pratt OB: [birth year]  MR#: [id number]  LOV#:[id number]  Patient Care Team: Dagoberto Ligas, MD as PCP - General (Internal Medicine)  CHIEF COMPLAINT:  Chief Complaint  Patient presents with  . Respiratory Distress    INTERVAL HISTORY: Patient sleep without family at bedside. She remains intubated. By report, she tolerated her chemotherapy well. She is scheduled for cycle 1, day 2 of treatment today which is etoposide only.   REVIEW OF SYSTEMS:   Review of Systems  Unable to perform ROS: intubated    PAST MEDICAL HISTORY: Past Medical History  Diagnosis Date  . Anxiety   . Arthritis   . COPD (chronic obstructive pulmonary disease) ([place])   . CHF (congestive heart failure) ([place])   . Hyperlipidemia   . Hypertension   . Diabetes mellitus without complication ([place])   . Chronic kidney disease   . Neuromuscular disorder ([place])     PAST SURGICAL HISTORY: Past Surgical History  Procedure Laterality Date  . Joint replacement Bilateral 2006    both knees  . Ankle surgery      FAMILY HISTORY Family History  Problem Relation Age of Onset  . Diabetes Mother   . Cancer Father   . Diabetes Sister   . Stroke Sister        ADVANCED DIRECTIVES:    HEALTH MAINTENANCE: Social History  Substance Use Topics  . Smoking status: Current Every Day Smoker    Types: Cigarettes  . Smokeless tobacco: None     Comment: 0.5 pack /day  . Alcohol Use: No     Colonoscopy:  PAP:  Bone density:  Lipid panel:  Allergies  Allergen Reactions  . Atorvastatin Other (See Comments)    Does not remember why she had Intolerance to Lipitor.  . Fentanyl Itching  . Rosuvastatin Anxiety    Chest tigtness and feeling as if she had heartburn.    Current Facility-Administered Medications  Medication Dose Route Frequency Provider Last Rate Last Dose  . 0.9 %  sodium chloride infusion   Intravenous Once  Lloyd Huger, MD      . acetaminophen (TYLENOL) tablet 650 mg  650 mg Oral Q6H PRN Harrie Foreman, MD   650 mg at 05/25/15 0630   Or  . acetaminophen (TYLENOL) suppository 650 mg  650 mg Rectal Q6H PRN Harrie Foreman, MD      . amLODipine (NORVASC) tablet 5 mg  5 mg Oral Daily Theodoro Grist, MD   5 mg at 05/27/15 0942  . antiseptic oral rinse solution (CORINZ)  7 mL Mouth Rinse QID Vishal Mungal, MD   7 mL at 05/27/15 1155  . budesonide (PULMICORT) nebulizer solution 0.5 mg  0.5 mg Nebulization BID Flora Lipps, MD   0.5 mg at 05/27/15 0801  . chlorhexidine gluconate (PERIDEX) 0.12 % solution 15 mL  15 mL Mouth Rinse BID Vishal Mungal, MD   15 mL at 05/27/15 0626  . colchicine tablet 0.6 mg  0.6 mg Per Tube Daily Wilhelmina Mcardle, MD   0.6 mg at 05/27/15 8841  . dexamethasone (DECADRON) 10 mg in sodium chloride 0.9 % 50 mL IVPB  10 mg Intravenous Once Lloyd Huger, MD      . dexmedetomidine (PRECEDEX) 400 MCG/100ML (4 mcg/mL) infusion  0-1.2 mcg/kg/hr Intravenous Continuous Wilhelmina Mcardle, MD 29 mL/hr at 05/27/15 1221 1.1 mcg/kg/hr at 05/27/15 1221  . etoposide (VEPESID) 180 mg in sodium chloride  0.9 % 500 mL chemo infusion  80 mg/m2 (Treatment Plan Actual) Intravenous Once Lloyd Huger, MD      . famotidine (PEPCID) tablet 20 mg  20 mg Per Tube BID Theodoro Grist, MD   20 mg at 05/27/15 0938  . feeding supplement (VITAL HIGH PROTEIN) liquid 1,000 mL  1,000 mL Per Tube Continuous Vishal Mungal, MD 50 mL/hr at 05/27/15 1221 1,000 mL at 05/27/15 1221  . fentaNYL (SUBLIMAZE) injection 25-100 mcg  25-100 mcg Intravenous Q2H PRN Wilhelmina Mcardle, MD   100 mcg at 05/27/15 0029  . free water 250 mL  250 mL Per Tube Q4H Laverle Hobby, MD   250 mL at 05/27/15 1145  . heparin injection 5,000 Units  5,000 Units Subcutaneous Q12H Laverle Hobby, MD   5,000 Units at 05/27/15 1202  . insulin aspart (novoLOG) injection 0-15 Units  0-15 Units Subcutaneous 6 times per day Wilhelmina Mcardle, MD   5 Units at 05/27/15 1202  . insulin aspart (novoLOG) injection 3 Units  3 Units Subcutaneous Q4H Theodoro Grist, MD   3 Units at 05/27/15 1203  . ipratropium-albuterol (DUONEB) 0.5-2.5 (3) MG/3ML nebulizer solution 3 mL  3 mL Nebulization Q6H Vishal Mungal, MD   3 mL at 05/27/15 1333  . linagliptin (TRADJENTA) tablet 5 mg  5 mg Per Tube Daily Wilhelmina Mcardle, MD   5 mg at 05/27/15 0939  . losartan (COZAAR) tablet 100 mg  100 mg Oral Daily Theodoro Grist, MD   100 mg at 05/27/15 0938  . metoprolol (LOPRESSOR) tablet 100 mg  100 mg Per Tube BID Wilhelmina Mcardle, MD   100 mg at 05/27/15 0942  . midazolam (VERSED) injection 1-2 mg  1-2 mg Intravenous Q2H PRN Wilhelmina Mcardle, MD   2 mg at 05/27/15 1153  . ondansetron (ZOFRAN) tablet 4 mg  4 mg Oral Q6H PRN Harrie Foreman, MD       Or  . ondansetron [facility]) injection 4 mg  4 mg Intravenous Q6H PRN Harrie Foreman, MD      . Derrill Memo ON 05/28/2015] predniSONE (DELTASONE) tablet 30 mg  30 mg Oral Q breakfast Laverle Hobby, MD       Followed by  . [START ON 05/29/2015] predniSONE (DELTASONE) tablet 20 mg  20 mg Oral Q breakfast Laverle Hobby, MD       Followed by  . [START ON 05/30/2015] predniSONE (DELTASONE) tablet 10 mg  10 mg Oral Q breakfast Laverle Hobby, MD      . pregabalin (LYRICA) capsule 50 mg  50 mg Per Tube TID Wilhelmina Mcardle, MD   50 mg at 05/27/15 4259    OBJECTIVE: Filed Vitals:   05/27/15 1200  BP: 138/66  Pulse: 70  Temp: 97.6 F (36.4 C)  Resp: 16     Body mass index is 38.35 kg/(m^2).    ECOG FS:4 - Bedbound  General:  intubated, but alert Eyes: Pink conjunctiva, anicteric sclera. HEENT:  ET tube in place. Lungs: Clear to auscultation bilaterally. Heart: Regular rate and rhythm. No rubs, murmurs, or gallops. Abdomen: Soft, nontender, nondistended. No organomegaly noted, normoactive bowel sounds. Musculoskeletal: No edema, cyanosis, or clubbing. Neuro: Lethargic.  Skin: No rashes or  petechiae noted.   LAB RESULTS:  Lab Results  Component Value Date   NA 150* 05/27/2015   K 3.8 05/27/2015   CL 115* 05/27/2015   CO2 28 05/27/2015   GLUCOSE 188* 05/27/2015   BUN 61* 05/27/2015  CREATININE 0.96 05/27/2015   CALCIUM 8.1* 05/27/2015   PROT 5.7* 05/26/2015   ALBUMIN 2.8* 05/26/2015   AST 38 05/26/2015   ALT 41 05/26/2015   ALKPHOS 44 05/26/2015   BILITOT 0.7 05/26/2015   GFRNONAA 60* 05/27/2015   GFRAA >60 05/27/2015    Lab Results  Component Value Date   WBC 5.9 05/26/2015   NEUTROABS 4.1 04/24/2015   HGB 13.7 05/26/2015   HCT 41.4 05/26/2015   MCV 92.3 05/26/2015   PLT 72* 05/26/2015     STUDIES: Dg Chest 1 View  05/26/2015  CLINICAL DATA:  Wheezing and shortness of breath EXAM: CHEST 1 VIEW COMPARISON:  Portable chest x-ray of today's date FINDINGS: There is persistent 0 opacification of the mid and upper thirds of the right hemi thorax. Only a small amount of aerated lung is visible in the lower hemi thorax. Pleural fluid on the right is suspected. The cardiac silhouette is mildly enlarged. The left lung is well-expanded. There is no focal infiltrate or pleural effusion on the left. The endotracheal tube tip projects 4.2 cm above the carina. The PICC line tip on the left overlies the junction of the proximal and midportions of the SVC. IMPRESSION: Fairly stable appearance of the chest since the previous study with opacification of the upper 2/3 of the right hemithorax consistent with atelectasis-pneumonia and left pleural effusion. The left lung remains clear. Electronically Signed   By: David  [place] M.D.   On: 05/26/2015 13:51   Ct Chest Wo Contrast  05/20/2015  CLINICAL DATA:  69 year old who presented with acute hypercapnic respiratory failure, current history of COPD and CHF, stage 3 chronic kidney disease, with an enlarging right pleural effusion and right paratracheal soft tissue on chest x-ray yesterday. EXAM: CT CHEST WITHOUT CONTRAST TECHNIQUE:  Multidetector CT imaging of the chest was performed following the standard protocol without IV contrast. Intravenous contrast was not administered due to the patient's chronic kidney disease. COMPARISON:  No prior CT.  Chest x-rays 05/16/2015 and earlier. FINDINGS: Best seen on coronal reformatted images is abrupt occlusion of the right upper lobe bronchus and the right bronchus intermedius centrally. As a result, there is dense consolidation in the entire right lung with a areas of hypoattenuation giving a "drowned lung" appearance. Without intravenous contrast, it is difficult to visualize the large obstructing mass as it has similar attenuation to the consolidated lung. There is an associated small to moderate sized right pleural effusion. No pulmonary parenchymal nodules or masses involving the left lung. Linear scarring involving the medial left lower lobe. No confluent airspace consolidation in the left lung. No left pleural effusion. Apparent pleural thickening throughout the left hemithorax is related to abundant subpleural fat. Bulky right paratracheal conglomerate lymphadenopathy in station 2R and station 4R measuring approximately 5.6 x 5.0 x 7.4 cm. Enlarged lymph nodes elsewhere in the right side of the mediastinum. Enlarged right retroclavicular lymph nodes, the largest measuring approximately 2.8 x 3.8 x 3.1 cm. The bulky mediastinal lymphadenopathy compresses the superior vena cava, and there is a prominent right internal mammary vein collateral. Heart size upper normal. No pericardial effusion. Mild LAD and right coronary artery atherosclerosis. Moderate atherosclerosis involving the thoracic and upper abdominal aorta and their visualized branches. Approximate 3 cm cyst involving the posterior segment right lobe of liver. Within the limits of the unenhanced technique, no solid mass involving visualized liver. Approximate 1.6 x 2.6 x 2.2 cm low-attenuation mass involving the left adrenal gland.  Solitary punctate calcification involving the  proximal body of the pancreas. Visualized upper abdomen otherwise unremarkable for the unenhanced technique. IMPRESSION: 1. Obstructing central mass involving the right lung with occlusion of the right upper lobe bronchus and the bronchus intermedius with complete atelectasis of the right lung. The mass is difficult to visualize as it is of similar attenuation to the atelectatic lung. Bronchoscopy is recommended in further evaluation and for diagnosis. 2. Small to moderate-sized right pleural effusion. 3. Metastatic mediastinal lymphadenopathy as detailed above, the largest conglomerate nodal mass in the right paratracheal region. 4. Mild compression of the superior vena cava with a prominent right internal mammary vein collateral. 5. Approximate 3 cm cyst involving the posterior segment right lobe of the visualized liver. 6. Approximate 2.6 cm low-attenuation mass involving the left adrenal gland, likely adenoma. Electronically Signed   By: Evangeline Dakin M.D.   On: 05/20/2015 12:41   US Renal  05/21/2015  CLINICAL DATA:  Acute renal failure EXAM: RENAL / URINARY TRACT ULTRASOUND COMPLETE COMPARISON:  None. FINDINGS: Right Kidney: Length: 11 cm. Echogenicity within normal limits. No mass or hydronephrosis visualized. There is cortical thinning probable due to atrophy Left Kidney: Length: 12.2 cm. Cortical thinning is noted probable due to atrophy. Echogenicity within normal limits. No mass or hydronephrosis visualized. Bladder: The urinary bladder is decompressed with Foley catheter. IMPRESSION: 1. No hydronephrosis. No renal calculi. Bilateral cortical thinning probable due to atrophy. Decompressed urinary bladder with Foley catheter. Electronically Signed   By: Lahoma Crocker M.D.   On: 05/21/2015 09:25   Dg Chest Port 1 View  05/26/2015  CLINICAL DATA:  Respiratory failure. EXAM: PORTABLE CHEST 1 VIEW COMPARISON:  05/25/2015 .  CT 05/20/2015. FINDINGS:  Endotracheal tube and NG tube noted in stable position. Interim increase consolidation in the right upper lobe. Persistent right pleural effusion. Left lung is clear. Stable cardiomegaly. No pneumothorax. No acute osseus abnormality P IMPRESSION: Interim increase in consolidation of the right upper lobe with persistent right pleural effusion. Persistent occlusion of the right mainstem bronchus appears to be present. Electronically Signed   By: [place]   On: 05/26/2015 07:13   Dg Chest Port 1 View  05/25/2015  CLINICAL DATA:  Respiratory failure. EXAM: PORTABLE CHEST 1 VIEW COMPARISON:  05/21/2015 FINDINGS: The ET tube tip is above the carina. The nasogastric tube tip is below the GE junction. Normal heart size. Aortic atherosclerosis. Partially loculated right pleural effusion is again identified and appears unchanged from the previous exam. Left lung is clear. IMPRESSION: 1. Loculated right pleural effusion is stable. 2. Left lung is clear. Electronically Signed   By: Kerby Moors M.D.   On: 05/25/2015 09:03   Dg Chest Port 1 View  05/21/2015  CLINICAL DATA:  Respiratory failure, intubated EXAM: PORTABLE CHEST 1 VIEW COMPARISON:  05/20/2015 FINDINGS: Cardiomegaly again noted. Stable endotracheal and NG tube position. Again noted almost complete opacification of the right hemi thorax. Chronic elevation of the right hemidiaphragm. Mild left basilar atelectasis. No convincing pulmonary edema. No pneumothorax. Atherosclerotic calcifications of thoracic aorta again noted. IMPRESSION: Stable endotracheal and NG tube position. Again noted almost complete opacification of the right hemi thorax. Chronic elevation of the right hemidiaphragm. Mild left basilar atelectasis. No convincing pulmonary edema. No pneumothorax. Atherosclerotic calcifications of thoracic aorta again noted. Electronically Signed   By: Lahoma Crocker M.D.   On: 05/21/2015 08:22   Dg Chest Port 1 View  05/20/2015  CLINICAL DATA:  Acute  respiratory failure with hypoxia. Status post bronchoscopy and line placement.  EXAM: PORTABLE CHEST 1 VIEW COMPARISON:  05/20/2015 at 1302 hours FINDINGS: Following bronchoscopy, there is now partial aeration of the right lung noted in the right upper to mid lung. There is also some aeration now noted in the right lower lung with a band of opacity noted across the mid lung bordering the minor fissure. Left lung is hyperexpanded but essentially clear. No pneumothorax. Endotracheal tube is stable with its tip 4.7 cm above the carina. Orogastric tube passes below the diaphragm. IMPRESSION: 1. Improved right lung aeration following bronchoscopy. No pneumothorax. 2. No other change. Electronically Signed   By: Lajean Manes M.D.   On: 05/20/2015 17:42   Dg Chest Port 1 View  05/20/2015  CLINICAL DATA:  Hypoxia EXAM: PORTABLE CHEST 1 VIEW COMPARISON:  Chest CT obtained earlier in the day; chest radiograph May 19, 2015 FINDINGS: Endotracheal tube tip is 4.6 cm above the carina. Nasogastric tube tip and side port are below the diaphragm. No pneumothorax. There is now complete opacification of the right hemi thorax, felt to be due to a combination of effusion and consolidation. Left lung is clear. Heart size is within normal limits. There is atherosclerotic change in the aorta. Mass lesions seen on CT are obscured on the right by the diffuse consolidation and effusion. IMPRESSION: Tube positions as described without pneumothorax. Left lung clear. Complete opacification on the right. Cardiac silhouette within normal limits. Atherosclerotic calcification noted. Electronically Signed   By: Lowella Grip III M.D.   On: 05/20/2015 13:18   Dg Chest Portable 1 View  05/13/2015  CLINICAL DATA:  69 year old female with increasing shortness of breath Coll the today. EXAM: PORTABLE CHEST 1 VIEW COMPARISON:  Chest x-ray 04/24/2015. FINDINGS: Enlarging right pleural effusion. Extensive opacities throughout the right mid  to lower lung, which may simply reflect passive atelectasis, however, underlying airspace consolidation or underlying mass is not excluded. Persistent prominence of right paratracheal soft tissue highly concerning for lymphadenopathy. Left lung appears relatively clear, although the left base is poorly visualized medially (likely technique related). No evidence of pulmonary edema. Heart size is normal. Atherosclerotic calcifications in the thoracic aorta. IMPRESSION: 1. Overall, there has been significant progression compared to the prior chest x-ray from 04/24/2015, with enlarging now a large right pleural effusion and worsening aeration throughout the right lung. This is unusual in the setting of a treated pneumonia, and given the presence of paratracheal soft tissue thickening on the right which is suspicious for lymphadenopathy, underlying neoplasm is strongly suspected. Further evaluation with contrast enhanced chest CT in is strongly recommended in the near future to evaluate for underlying neoplasm. 2. Atherosclerosis. Electronically Signed   By: Vinnie Langton M.D.   On: 05/08/2015 01:27   Dg Abd Portable 1v  05/20/2015  CLINICAL DATA:  OG an indentation. EXAM: PORTABLE ABDOMEN - 1 VIEW COMPARISON:  None. FINDINGS: Enteric tube tip is adequately positioned in the body of the stomach. Mildly prominent gas-filled loops of small bowel are noted within the right lower quadrant. Overall bowel gas pattern is indeterminate. No evidence of free intraperitoneal air seen. Large dense opacity noted at the right lung base. IMPRESSION: 1. OG tube appears adequately positioned with tip in the region of the stomach body. 2. Large dense opacity at the right lung base, incompletely imaged, which could be consolidation or effusion. Electronically Signed   By: Franki Cabot M.D.   On: 05/20/2015 13:19    ASSESSMENT:  Small cell lung cancer.   PLAN:    1.  Small cell lung cancer: Patient is likely stage IV given her  suspicious adrenal lesion, but full staging workup cannot be completed given her acute illness. Patient has had multiple failed attempts at extubation likely secondary to external compression of her mass. Patient received cycle 1, day 1 of cisplatin and etoposide yesterday without significant problems. Patient will receive etoposide only today and tomorrow.  She will ultimately require port placement once she is extubated. XRT would be helpful, but there are patient safety concerns transporting to radiation oncology.   2. Thrombocytopenia: Unclear etiology, but most likely multifactorial secondary to acute illness. Proceed with chemotherapy as above cautiously and check daily CBC.   Will follow.   Lloyd Huger, MD   05/27/2015 1:35 PM

## 2015-05-27 NOTE — Progress Notes (Signed)
[facility] Progess Note   ASSESSMENT/PLAN   Patient is a 69 year old female with newly diagnosed right complete lung atelectasis due to small cell lung cancer, which is newly diagnosed on this admission. The patient is vent dependent. Due to the right lung atelectasis. She has been started on chemotherapy in the hopes that it can shrink the tumor and the patient be extubated.  PULMONARY  A: -Acute respiratory failure due to complete right lung atelectasis from newly diagnosed SCLC.  -Pleural effusion.  -Vent dependent, has not been able to tolerate any weaning trial, and appears far from any chance of safely extubating.   P:   -Proceed with chemo per plan.  -Given her severe respiratory failure, and barring a tremendous improvement over the next few days I would recommend a tracheostomy at this point, is it will make transport safer and can improve her chance of extubation. She may also likely require it long term.   CARDIOVASCULAR -stable.   RENAL CKD, stable.  -Hypernatremia, continue free water replacement.   GASTROINTESTINAL Continue pepcid tube feeds for Gi prophylaxis.   HEMATOLOGIC Stable.   INFECTIOUS A:--  BCx2 10/24; negative.  Bronch Cx. 05/20/15>> normal flora. AFB and fungi pending.    ENDOCRINE A:  Diabetes, hyperglycemia.  P:   Continue every 4 scheduled insulin as well as sliding scale. Her blood sugars appear to be slightly improved with this regimen.  NEUROLOGIC A: Anxiety.     MAJOR EVENTS/TEST RESULTS: Bronch 10/25 with forceps biopsy of RUL>> SCLC.   INDWELLING DEVICES:: Endotracheal tube: 10/25. CVC triple lumen: Right femoral: 10/25 Double-lumen PICC line: 10/31.  ---------------------------------------   ----------------------------------------   Name: HINLEY BRIMAGE MRN: [id number] DOB: [birth year]    ADMISSION DATE:  04/27/2015    SUBJECTIVE:   Pt currently on the ventilator, can not provide  history or review of systems.   Review of Systems:  Patient is currently on the ventilator and cannot provide a review of systems.  VITAL SIGNS: Temp:  [97.1 F (36.2 C)-98.6 F (37 C)] 97.8 F (36.6 C) (11/01 0758) Pulse Rate:  [43-94] 75 (11/01 0800) Resp:  [13-24] 16 (11/01 0800) BP: (130-186)/(64-118) 141/69 mmHg (11/01 0800) SpO2:  [85 %-93 %] 90 % (11/01 0801) FiO2 (%):  [28 %] 28 % (11/01 0815) Weight:  [111.1 kg (244 lb 14.9 oz)] 111.1 kg (244 lb 14.9 oz) (11/01 0601) HEMODYNAMICS:   VENTILATOR SETTINGS: Vent Mode:  [-] PRVC FiO2 (%):  [28 %] 28 % Set Rate:  [14 bmp] 14 bmp Vt Set:  [500 mL] 500 mL PEEP:  [5 cmH20] 5 cmH20 Pressure Support:  [12 cmH20] 12 cmH20 INTAKE / OUTPUT:  Intake/Output Summary (Last 24 hours) at 05/27/15 0950 Last data filed at 05/27/15 0800  Gross per 24 hour  Intake   3506 ml  Output   3790 ml  Net   -284 ml    PHYSICAL EXAMINATION: Physical Examination:   VS: BP 141/69 mmHg  Pulse 75  Temp(Src) 97.8 F (36.6 C) (Axillary)  Resp 16  Ht '5\' 7"'$  (1.702 m)  Wt 111.1 kg (244 lb 14.9 oz)  BMI 38.35 kg/m2  SpO2 90%  General Appearance: No distress  Neuro:without focal findings, mental status normal. HEENT: PERRLA, EOM intact. Pulmonary: normal breath sounds   CardiovascularNormal S1,S2.  No m/r/g.   Abdomen: Benign, Soft, non-tender. Renal:  No costovertebral tenderness  GU:  Not performed at this time. Endocrine: No evident thyromegaly. Skin:   warm, no rashes,  no ecchymosis  Extremities: normal, no cyanosis, clubbing.   LABS:   LABORATORY PANEL:   CBC  Recent Labs Lab 05/26/15 0456  WBC 5.9  HGB 13.7  HCT 41.4  PLT 72*    Chemistries   Recent Labs Lab 05/26/15 0456 05/27/15 0450 05/27/15 0500  NA 150*  --  150*  K 3.9  --  3.8  CL 112*  --  115*  CO2 30  --  28  GLUCOSE 195*  --  188*  BUN 65*  --  61*  CREATININE 1.12*  --  0.96  CALCIUM 8.7*  --  8.1*  MG 1.9 2.0  --   PHOS 4.0 4.0  --   AST 38   --   --   ALT 41  --   --   ALKPHOS 44  --   --   BILITOT 0.7  --   --      Recent Labs Lab 05/26/15 1533 05/26/15 1602 05/26/15 2013 05/26/15 2351 05/27/15 0425 05/27/15 0730  GLUCAP 257* 252* 246* 242* 167* 188*    Recent Labs Lab 05/20/15 1430 05/21/15 0845  PHART 7.37 7.42  PCO2ART 59* 52*  PO2ART 62* 79*    Recent Labs Lab 05/25/15 0503 05/26/15 0456  AST 33 38  ALT 30 41  ALKPHOS 42 44  BILITOT 0.6 0.7  ALBUMIN 2.7* 2.8*    Cardiac Enzymes No results for input(s): TROPONINI in the last 168 hours.  RADIOLOGY:  Dg Chest 1 View  05/26/2015  CLINICAL DATA:  Wheezing and shortness of breath EXAM: CHEST 1 VIEW COMPARISON:  Portable chest x-ray of today's date FINDINGS: There is persistent 0 opacification of the mid and upper thirds of the right hemi thorax. Only a small amount of aerated lung is visible in the lower hemi thorax. Pleural fluid on the right is suspected. The cardiac silhouette is mildly enlarged. The left lung is well-expanded. There is no focal infiltrate or pleural effusion on the left. The endotracheal tube tip projects 4.2 cm above the carina. The PICC line tip on the left overlies the junction of the proximal and midportions of the SVC. IMPRESSION: Fairly stable appearance of the chest since the previous study with opacification of the upper 2/3 of the right hemithorax consistent with atelectasis-pneumonia and left pleural effusion. The left lung remains clear. Electronically Signed   By: David  Martinique M.D.   On: 05/26/2015 13:51   Dg Chest Port 1 View  05/26/2015  CLINICAL DATA:  Respiratory failure. EXAM: PORTABLE CHEST 1 VIEW COMPARISON:  05/25/2015 .  CT 05/20/2015. FINDINGS: Endotracheal tube and NG tube noted in stable position. Interim increase consolidation in the right upper lobe. Persistent right pleural effusion. Left lung is clear. Stable cardiomegaly. No pneumothorax. No acute osseus abnormality P IMPRESSION: Interim increase in  consolidation of the right upper lobe with persistent right pleural effusion. Persistent occlusion of the right mainstem bronchus appears to be present. Electronically Signed   By: Marcello Moores  Register   On: 05/26/2015 07:13       --Marda Stalker, MD.  Pager [phone] [facility] Office Number: [phone]  Patricia Pesa, M.D.  Vilinda Boehringer, M.D.  Merton Border, M.D  [place].  I have personally obtained a history, examined the patient, evaluated laboratory and imaging results, formulated the assessment and plan and placed orders. The Patient requires high complexity decision making for assessment and support, frequent evaluation and titration of therapies, application of advanced  monitoring technologies and extensive interpretation of multiple databases. The patient has critical illness that could lead imminently to failure of 1 or more organ systems and requires the highest level of physician preparedness to intervene.  Critical Care Time devoted to patient care services described in this note is 35 minutes and is exclusive of time spent in procedures.

## 2015-05-27 NOTE — Progress Notes (Signed)
[facility] NAME: Emily Pratt    MR#:  [id number]  DATE OF BIRTH:  July 18, 1946  SUBJECTIVE:  CHIEF COMPLAINT:  Intubated ,off sedation, comfortable, patient would like to have TT placed in case it is needed. D/w DR , Ashby Dawes, TT placement was recommended,  chemotherapy was initiated on 05/26/2015. Patient denies any discomfort. She was seen by palliative care physician who is going to follow her along while she is in the hospital  REVIEW OF SYSTEMS:   Unable to obtain  DRUG ALLERGIES:   Allergies  Allergen Reactions  . Atorvastatin Other (See Comments)    Does not remember why she had Intolerance to Lipitor.  . Fentanyl Itching  . Rosuvastatin Anxiety    Chest tigtness and feeling as if she had heartburn.    VITALS:  Blood pressure 138/66, pulse 70, temperature 97.6 F (36.4 C), temperature source Axillary, resp. rate 16, height '5\' 7"'$  (1.702 m), weight 111.1 kg (244 lb 14.9 oz), SpO2 88 %.  PHYSICAL EXAMINATION:  GENERAL:  69 y.o.-year-old patient lying in the bed, intubated, somnolent, easily awakened. Opens eyes , communicates, comfortable EYES: Pupils equal, round, reactive to light and accommodation. No scleral icterus.  HEENT: Head atraumatic, normocephalic. Oropharynx and nasopharynx clear. ETT in place NECK:  Supple, no jugular venous distention. No thyroid enlargement, no tenderness.  LUNGS: Markedly diminished breath sounds on the right , , good air movements on the left  CARDIOVASCULAR: S1, S2 normal. No murmurs, rubs, or gallops.  ABDOMEN: Soft, nontender, nondistended. Bowel sounds present. No organomegaly or mass.  EXTREMITIES: +1 pedal edema, cyanosis, or clubbing.  NEUROLOGIC: CN 2-12 intact, strength 5/5, sedated, does not follow commands PSYCHIATRIC: Somnolent SKIN: Sacral decubitus ulcer   LABORATORY PANEL:   CBC  Recent Labs Lab 05/26/15 0456  WBC 5.9  HGB 13.7  HCT 41.4  PLT 72*    ------------------------------------------------------------------------------------------------------------------  Chemistries   Recent Labs Lab 05/26/15 0456 05/27/15 0450 05/27/15 0500  NA 150*  --  150*  K 3.9  --  3.8  CL 112*  --  115*  CO2 30  --  28  GLUCOSE 195*  --  188*  BUN 65*  --  61*  CREATININE 1.12*  --  0.96  CALCIUM 8.7*  --  8.1*  MG 1.9 2.0  --   AST 38  --   --   ALT 41  --   --   ALKPHOS 44  --   --   BILITOT 0.7  --   --    ------------------------------------------------------------------------------------------------------------------  Cardiac Enzymes No results for input(s): TROPONINI in the last 168 hours. ------------------------------------------------------------------------------------------------------------------  RADIOLOGY:  Dg Chest 1 View  05/26/2015  CLINICAL DATA:  Wheezing and shortness of breath EXAM: CHEST 1 VIEW COMPARISON:  Portable chest x-ray of today's date FINDINGS: There is persistent 0 opacification of the mid and upper thirds of the right hemi thorax. Only a small amount of aerated lung is visible in the lower hemi thorax. Pleural fluid on the right is suspected. The cardiac silhouette is mildly enlarged. The left lung is well-expanded. There is no focal infiltrate or pleural effusion on the left. The endotracheal tube tip projects 4.2 cm above the carina. The PICC line tip on the left overlies the junction of the proximal and midportions of the SVC. IMPRESSION: Fairly stable appearance of the chest since the previous study with opacification of the upper 2/3 of the right hemithorax consistent  with atelectasis-pneumonia and left pleural effusion. The left lung remains clear. Electronically Signed   By: David  Martinique M.D.   On: 05/26/2015 13:51   Dg Chest Port 1 View  05/26/2015  CLINICAL DATA:  Respiratory failure. EXAM: PORTABLE CHEST 1 VIEW COMPARISON:  05/25/2015 .  CT 05/20/2015. FINDINGS: Endotracheal tube and NG tube  noted in stable position. Interim increase consolidation in the right upper lobe. Persistent right pleural effusion. Left lung is clear. Stable cardiomegaly. No pneumothorax. No acute osseus abnormality P IMPRESSION: Interim increase in consolidation of the right upper lobe with persistent right pleural effusion. Persistent occlusion of the right mainstem bronchus appears to be present. Electronically Signed   By: Marcello Moores  Register   On: 05/26/2015 07:13    EKG:   Orders placed or performed during the hospital encounter of 05/17/2015  . EKG 12-Lead  . EKG 12-Lead    ASSESSMENT AND PLAN:   1. Acute on chronic respiratory failure with hypoxia and Hypercapnia,  - Multifactorial due to large right-sided pleural effusion/ CHF/ COPD / mass- SCLC - Pulmonology and oncology is following - Continue nebulizer, steroids, antibiotics. Oncology initiated chemotherapy October 31st 2016 with cisplatin and etoposide with hopes to shrink the mass. Timing of shrinkage  is unclear. Appreciate palliative care input , get ENT involved for tracheostomy tube placement  2. 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 Vital signs are good  3. Acute on chronic kidney disease:  - Due to ATN, hypovolemia and hypotension, kidney function is normal now - Nephrology signed off. Patient was restarted on losartan. Following kidney function closely  4. Essential hypertension:  - , continue metoprolol and losartan, norvasc, better blood pressure control  5. Diabetes mellitus type 2: Hold home medications Provide sliding scale insulin, blood glucose is ranging around 200, now on NovoLog RTC to cover tube feeds, may need to advance  6.  Small cell lung cancer, likely stage IV per oncology - Status post bronchoscopy and 10/25, pathology show small cell lung cancer -  Long-term history of heavy smoking - pathology reports were discussed with the patient and sister in the past.   - Oncology initiated chemotherapy ,  PICC line for chemotherapy is placed 05/26/2015- Radiation oncology has been consulted and patient may benefit from radiology oncology intervention as well, since she is intubated. It might be difficult to perform  7. Postobstructive pneumonia versus atelectasis - Blood cultures negative to date, respiratory, BAL, AFB normal flora - Discontinue vancomycin and Zosyn, completed 7 day Levaquin course  8. Arrhythmia, SVT versus paroxysmal atrial fibrillation during bronchoscopy, now in sinus rhythm, continue metoprolol per cardiology's recommendations  9 thrombocytopenia, following closely, may need transfusions with chemotherapy for lung ca  All the records are reviewed and case discussed with the patient's husband and sister at the bedside.  CODE STATUS: Full code  TOTAL CRITICAL CARE TIME TAKING CARE OF THIS PATIENT: 45 minutes.      Theodoro Grist M.D on 05/27/2015 at 1:28 PM  Between 7am to 6pm - Pager - [phone] After 6pm go to [email or web address] - password [email or web address]  Office  [phone]  CC: Primary care physician; Dagoberto Ligas, MD

## 2015-05-27 NOTE — Progress Notes (Signed)
Pharmacy Consult for Electrolyte Monitoring   Allergies  Allergen Reactions  . Atorvastatin Other (See Comments)    Does not remember why she had Intolerance to Lipitor.  . Fentanyl Itching  . Rosuvastatin Anxiety    Chest tigtness and feeling as if she had heartburn.    Patient Measurements: Height: '5\' 7"'$  (170.2 cm) Weight: 244 lb 14.9 oz (111.1 kg) IBW/kg (Calculated) : 61.6   Vital Signs: Temp: 97.8 F (36.6 C) (11/01 0758) Temp Source: Axillary (11/01 0758) BP: 141/69 mmHg (11/01 0800) Pulse Rate: 75 (11/01 0800) Intake/Output from previous day: 10/31 0701 - 11/01 0700 In: 3632.4 [I.V.:693.4; NG/GT:1250; IV Piggyback:1689] Out: 8841 [Urine:3490; Stool:300] Intake/Output from this shift: Total I/O In: 29 [I.V.:29] Out: -   Labs:  Recent Labs  05/25/15 0503 05/26/15 0456  WBC 5.0 5.9  HGB 13.7 13.7  HCT 41.8 41.4  PLT 69* 72*     Recent Labs  05/25/15 0503 05/26/15 0456 05/27/15 0450 05/27/15 0500  NA 147* 150*  --  150*  K 4.1 3.9  --  3.8  CL 110 112*  --  115*  CO2 29 30  --  28  GLUCOSE 164* 195*  --  188*  BUN 58* 65*  --  61*  CREATININE 1.07* 1.12*  --  0.96  CALCIUM 8.5* 8.7*  --  8.1*  MG 1.9 1.9 2.0  --   PHOS 3.7 4.0 4.0  --   PROT 5.6* 5.7*  --   --   ALBUMIN 2.7* 2.8*  --   --   AST 33 38  --   --   ALT 30 41  --   --   ALKPHOS 42 44  --   --   BILITOT 0.6 0.7  --   --    Estimated Creatinine Clearance: 73.1 mL/min (by C-G formula based on Cr of 0.96).    Recent Labs  05/26/15 2351 05/27/15 0425 05/27/15 0730  GLUCAP 242* 167* 188*    Medical History: Past Medical History  Diagnosis Date  . Anxiety   . Arthritis   . COPD (chronic obstructive pulmonary disease) ([place])   . CHF (congestive heart failure) ([place])   . Hyperlipidemia   . Hypertension   . Diabetes mellitus without complication ([place])   . Chronic kidney disease   . Neuromuscular disorder ([place])     Medications:  Scheduled:  . sodium chloride    Intravenous Once  . amLODipine  5 mg Oral Daily  . antiseptic oral rinse  7 mL Mouth Rinse QID  . budesonide (PULMICORT) nebulizer solution  0.5 mg Nebulization BID  . chlorhexidine gluconate  15 mL Mouth Rinse BID  . colchicine  0.6 mg Per Tube Daily  . dexamethasone (DECADRON) IVPB [facility]  10 mg Intravenous Once  . etoposide  80 mg/m2 (Treatment Plan Actual) Intravenous Once  . famotidine  20 mg Per Tube BID  . free water  250 mL Per Tube Q4H  . heparin subcutaneous  5,000 Units Subcutaneous Q12H  . insulin aspart  0-15 Units Subcutaneous 6 times per day  . insulin aspart  3 Units Subcutaneous Q4H  . ipratropium-albuterol  3 mL Nebulization Q6H  . linagliptin  5 mg Per Tube Daily  . losartan  100 mg Oral Daily  . metoprolol  100 mg Per Tube BID  . [START ON 05/28/2015] predniSONE  30 mg Oral Q breakfast   Followed by  . [START ON 05/29/2015] predniSONE  20 mg Oral Q  breakfast   Followed by  . [START ON 05/30/2015] predniSONE  10 mg Oral Q breakfast  . pregabalin  50 mg Per Tube TID   Infusions:  . dexmedetomidine 1.1 mcg/kg/hr (05/27/15 0934)  . feeding supplement (VITAL HIGH PROTEIN) 1,000 mL (05/26/15 1728)    Assessment: Electrolytes remain WNL.  Plan:  No supplementation warranted at this time.  Will reorder levels with AM labs. Pharmacy will continue to monitor labs and replace electrolytes as needed.   Ulice Dash, PharmD

## 2015-05-27 DEATH — deceased

## 2015-05-28 ENCOUNTER — Inpatient Hospital Stay: Payer: Medicare Other

## 2015-05-28 DIAGNOSIS — C3491 Malignant neoplasm of unspecified part of right bronchus or lung: Secondary | ICD-10-CM

## 2015-05-28 DIAGNOSIS — J9811 Atelectasis: Secondary | ICD-10-CM

## 2015-05-28 LAB — CBC
HCT: 38.7 % (ref 35.0–47.0)
HEMOGLOBIN: 12.4 g/dL (ref 12.0–16.0)
MCH: 30 pg (ref 26.0–34.0)
MCHC: 32.1 g/dL (ref 32.0–36.0)
MCV: 93.3 fL (ref 80.0–100.0)
PLATELETS: 62 10*3/uL — AB (ref 150–440)
RBC: 4.15 MIL/uL (ref 3.80–5.20)
RDW: 18 % — ABNORMAL HIGH (ref 11.5–14.5)
WBC: 5.1 10*3/uL (ref 3.6–11.0)

## 2015-05-28 LAB — GLUCOSE, CAPILLARY
Glucose-Capillary: 164 mg/dL — ABNORMAL HIGH (ref 65–99)
Glucose-Capillary: 165 mg/dL — ABNORMAL HIGH (ref 65–99)
Glucose-Capillary: 174 mg/dL — ABNORMAL HIGH (ref 65–99)
Glucose-Capillary: 187 mg/dL — ABNORMAL HIGH (ref 65–99)
Glucose-Capillary: 190 mg/dL — ABNORMAL HIGH (ref 65–99)
Glucose-Capillary: 201 mg/dL — ABNORMAL HIGH (ref 65–99)
Glucose-Capillary: 208 mg/dL — ABNORMAL HIGH (ref 65–99)

## 2015-05-28 LAB — BASIC METABOLIC PANEL
ANION GAP: 6 (ref 5–15)
BUN: 71 mg/dL — ABNORMAL HIGH (ref 6–20)
CALCIUM: 8.4 mg/dL — AB (ref 8.9–10.3)
CO2: 28 mmol/L (ref 22–32)
Chloride: 119 mmol/L — ABNORMAL HIGH (ref 101–111)
Creatinine, Ser: 1.1 mg/dL — ABNORMAL HIGH (ref 0.44–1.00)
GFR, EST AFRICAN AMERICAN: 59 mL/min — AB (ref >60)
GFR, EST NON AFRICAN AMERICAN: 51 mL/min — AB (ref >60)
Glucose, Bld: 207 mg/dL — ABNORMAL HIGH (ref 65–99)
Potassium: 3.9 mmol/L (ref 3.5–5.1)
SODIUM: 153 mmol/L — AB (ref 135–145)

## 2015-05-28 LAB — OSMOLALITY: OSMOLALITY: 349 mosm/kg — AB (ref 275–295)

## 2015-05-28 LAB — OSMOLALITY, URINE: OSMOLALITY UR: 440 mosm/kg (ref 300–900)

## 2015-05-28 LAB — SODIUM: Sodium: 150 mmol/L — ABNORMAL HIGH (ref 135–145)

## 2015-05-28 MED ORDER — SODIUM CHLORIDE 0.9 % IV SOLN
10.0000 mg | Freq: Once | INTRAVENOUS | Status: AC
  Administered 2015-05-28: 10 mg via INTRAVENOUS
  Filled 2015-05-28: qty 1

## 2015-05-28 MED ORDER — INSULIN ASPART 100 UNIT/ML ~~LOC~~ SOLN
5.0000 [IU] | SUBCUTANEOUS | Status: DC
  Administered 2015-05-28 – 2015-05-30 (×10): 5 [IU] via SUBCUTANEOUS
  Filled 2015-05-28 (×10): qty 5

## 2015-05-28 MED ORDER — SODIUM CHLORIDE 0.9 % IV SOLN
80.0000 mg/m2 | Freq: Once | INTRAVENOUS | Status: AC
  Administered 2015-05-28: 180 mg via INTRAVENOUS
  Filled 2015-05-28: qty 9

## 2015-05-28 MED ORDER — INSULIN ASPART 100 UNIT/ML ~~LOC~~ SOLN: 4.0000 [IU] | SUBCUTANEOUS | Status: DC

## 2015-05-28 MED ORDER — SODIUM CHLORIDE 0.45 % IV SOLN
INTRAVENOUS | Status: DC
  Administered 2015-05-28: 11:00:00 via INTRAVENOUS
  Administered 2015-05-29: 75 mL/h via INTRAVENOUS
  Administered 2015-05-29 – 2015-05-30 (×2): via INTRAVENOUS
  Administered 2015-05-31: 75 mL/h via INTRAVENOUS
  Administered 2015-05-31: 07:00:00 via INTRAVENOUS

## 2015-05-28 MED ORDER — SODIUM CHLORIDE 0.9 % IV SOLN
Freq: Once | INTRAVENOUS | Status: AC
  Administered 2015-05-28: 18:00:00 via INTRAVENOUS

## 2015-05-28 MED ORDER — INSULIN DETEMIR 100 UNIT/ML ~~LOC~~ SOLN
15.0000 [IU] | SUBCUTANEOUS | Status: DC
Start: 2015-05-28 — End: 2015-06-07
  Administered 2015-05-28 – 2015-06-07 (×10): 15 [IU] via SUBCUTANEOUS
  Filled 2015-05-28 (×11): qty 0.15

## 2015-05-28 MED ORDER — DEXMEDETOMIDINE HCL IN NACL 400 MCG/100ML IV SOLN
0.0000 ug/kg/h | INTRAVENOUS | Status: AC
  Administered 2015-05-28 – 2015-05-29 (×4): 1.1 ug/kg/h via INTRAVENOUS
  Administered 2015-05-29: 0.6 ug/kg/h via INTRAVENOUS
  Administered 2015-05-29: 1.1 ug/kg/h via INTRAVENOUS
  Administered 2015-05-30: 0.7 ug/kg/h via INTRAVENOUS
  Administered 2015-05-30: 0.6 ug/kg/h via INTRAVENOUS
  Administered 2015-05-30: 1 ug/kg/h via INTRAVENOUS
  Administered 2015-05-30 – 2015-05-31 (×2): 0.6 ug/kg/h via INTRAVENOUS
  Administered 2015-05-31: 1 ug/kg/h via INTRAVENOUS
  Administered 2015-05-31: 0.6 ug/kg/h via INTRAVENOUS
  Filled 2015-05-28 (×15): qty 100

## 2015-05-28 NOTE — Progress Notes (Signed)
Pharmacy Consult for Electrolyte Monitoring   Allergies  Allergen Reactions  . Atorvastatin Other (See Comments)    Does not remember why she had Intolerance to Lipitor.  . Fentanyl Itching  . Rosuvastatin Anxiety    Chest tigtness and feeling as if she had heartburn.    Patient Measurements: Height: '5\' 7"'$  (170.2 cm) Weight: 235 lb 3.7 oz (106.7 kg) IBW/kg (Calculated) : 61.6   Vital Signs: Temp: 97.6 F (36.4 C) (11/02 1137) Temp Source: Axillary (11/02 1137) BP: 145/70 mmHg (11/02 1200) Pulse Rate: 75 (11/02 1200) Intake/Output from previous day: 11/01 0701 - 11/02 0700 In: 2221.9 [I.V.:610.9; NG/GT:1611] Out: 4315 [Urine:970; Emesis/NG output:285] Intake/Output from this shift: Total I/O In: 342.5 [I.V.:222.5; NG/GT:120] Out: 1000 [Urine:1000]  Labs:  Recent Labs  05/26/15 0456 05/27/15 1457 05/28/15 0452  WBC 5.9 5.0 5.1  HGB 13.7 12.6 12.4  HCT 41.4 39.7 38.7  PLT 72* 68* 62*     Recent Labs  05/26/15 0456 05/27/15 0450 05/27/15 0500 05/27/15 1457 05/28/15 0452  NA 150*  --  150* 151* 153*  K 3.9  --  3.8 3.9 3.9  CL 112*  --  115* 116* 119*  CO2 30  --  '28 28 28  '$ GLUCOSE 195*  --  188* 233* 207*  BUN 65*  --  61* 67* 71*  CREATININE 1.12*  --  0.96 0.97 1.10*  CALCIUM 8.7*  --  8.1* 8.3* 8.4*  MG 1.9 2.0  --   --   --   PHOS 4.0 4.0  --   --   --   PROT 5.7*  --   --  5.3*  --   ALBUMIN 2.8*  --   --  2.6*  --   AST 38  --   --  35  --   ALT 41  --   --  49  --   ALKPHOS 44  --   --  43  --   BILITOT 0.7  --   --  0.8  --    Estimated Creatinine Clearance: 62.4 mL/min (by C-G formula based on Cr of 1.1).    Recent Labs  05/28/15 0503 05/28/15 0744 05/28/15 1417  GLUCAP 165* 187* 201*    Medical History: Past Medical History  Diagnosis Date  . Anxiety   . Arthritis   . COPD (chronic obstructive pulmonary disease) ([place])   . CHF (congestive heart failure) ([place])   . Hyperlipidemia   . Hypertension   . Diabetes mellitus  without complication ([place])   . Chronic kidney disease   . Neuromuscular disorder ([place])     Medications:  Scheduled:  . sodium chloride   Intravenous Once  . amLODipine  5 mg Oral Daily  . antiseptic oral rinse  7 mL Mouth Rinse QID  . budesonide (PULMICORT) nebulizer solution  0.5 mg Nebulization BID  . chlorhexidine gluconate  15 mL Mouth Rinse BID  . colchicine  0.6 mg Per Tube Daily  . dexamethasone (DECADRON) IVPB [facility]  10 mg Intravenous Once  . etoposide  80 mg/m2 (Treatment Plan Actual) Intravenous Once  . famotidine  20 mg Per Tube BID  . free water  250 mL Per Tube Q4H  . heparin subcutaneous  5,000 Units Subcutaneous Q12H  . insulin aspart  0-15 Units Subcutaneous 6 times per day  . insulin aspart  5 Units Subcutaneous Q4H  . insulin detemir  15 Units Subcutaneous Q24H  . ipratropium-albuterol  3 mL Nebulization Q6H  .  losartan  100 mg Oral Daily  . metoprolol  100 mg Per Tube BID  . [START ON 05/29/2015] predniSONE  20 mg Oral Q breakfast   Followed by  . [START ON 05/30/2015] predniSONE  10 mg Oral Q breakfast  . pregabalin  50 mg Per Tube TID   Infusions:  . sodium chloride 75 mL/hr at 05/28/15 1058  . feeding supplement (VITAL HIGH PROTEIN) 1,000 mL (05/27/15 1430)    Assessment: Electrolytes remain WNL.  Plan:  No supplementation warranted at this time.  Will reorder BMP with AM labs and obtain magnesium/phos if potassium is low.   Pharmacy will continue to monitor labs and replace electrolytes as needed.   Currie Paris, PharmD

## 2015-05-28 NOTE — Progress Notes (Signed)
Pt received  Etoposide and tol ell. No immed s/s reactions.  Good blood return at picc site upper rt arm. vss.

## 2015-05-28 NOTE — Progress Notes (Signed)
[place]  [phone] [phone]  ID: Emily Pratt OB: 08/23/1945  MR#: [id number]  YIR#:[id number]  Patient Care Team: Dagoberto Ligas, MD as PCP - General (Internal Medicine)  CHIEF COMPLAINT:  Chief Complaint  Patient presents with  . Respiratory Distress    INTERVAL HISTORY: Patient alert and sister at the bedside. She remained intubated, but offers no new complaints. She is scheduled for cycle 1, day 3 of treatment today which is etoposide only.   REVIEW OF SYSTEMS:   Review of Systems  Unable to perform ROS: intubated    PAST MEDICAL HISTORY: Past Medical History  Diagnosis Date  . Anxiety   . Arthritis   . COPD (chronic obstructive pulmonary disease) ([place])   . CHF (congestive heart failure) ([place])   . Hyperlipidemia   . Hypertension   . Diabetes mellitus without complication ([place])   . Chronic kidney disease   . Neuromuscular disorder ([place])     PAST SURGICAL HISTORY: Past Surgical History  Procedure Laterality Date  . Joint replacement Bilateral 2006    both knees  . Ankle surgery      FAMILY HISTORY Family History  Problem Relation Age of Onset  . Diabetes Mother   . Cancer Father   . Diabetes Sister   . Stroke Sister        ADVANCED DIRECTIVES:    HEALTH MAINTENANCE: Social History  Substance Use Topics  . Smoking status: Current Every Day Smoker    Types: Cigarettes  . Smokeless tobacco: None     Comment: 0.5 pack /day  . Alcohol Use: No     Colonoscopy:  PAP:  Bone density:  Lipid panel:  Allergies  Allergen Reactions  . Atorvastatin Other (See Comments)    Does not remember why she had Intolerance to Lipitor.  . Fentanyl Itching  . Rosuvastatin Anxiety    Chest tigtness and feeling as if she had heartburn.    Current Facility-Administered Medications  Medication Dose Route Frequency Provider Last Rate Last Dose  . 0.45 % sodium chloride infusion   Intravenous Continuous Laverle Hobby, MD 75 mL/hr at 05/28/15 1058    . 0.9 %  sodium chloride infusion   Intravenous Once Lloyd Huger, MD      . acetaminophen (TYLENOL) tablet 650 mg  650 mg Oral Q6H PRN Harrie Foreman, MD   650 mg at 05/25/15 0630   Or  . acetaminophen (TYLENOL) suppository 650 mg  650 mg Rectal Q6H PRN Harrie Foreman, MD      . amLODipine (NORVASC) tablet 5 mg  5 mg Oral Daily Theodoro Grist, MD   5 mg at 05/28/15 1017  . antiseptic oral rinse solution (CORINZ)  7 mL Mouth Rinse QID Vishal Mungal, MD   7 mL at 05/28/15 1130  . budesonide (PULMICORT) nebulizer solution 0.5 mg  0.5 mg Nebulization BID Flora Lipps, MD   0.5 mg at 05/28/15 0850  . chlorhexidine gluconate (PERIDEX) 0.12 % solution 15 mL  15 mL Mouth Rinse BID Vishal Mungal, MD   15 mL at 05/28/15 0835  . colchicine tablet 0.6 mg  0.6 mg Per Tube Daily Wilhelmina Mcardle, MD   0.6 mg at 05/28/15 1018  . dexamethasone (DECADRON) 10 mg in sodium chloride 0.9 % 50 mL IVPB  10 mg Intravenous Once Lloyd Huger, MD      . etoposide (VEPESID) 180 mg in sodium chloride 0.9 % 500 mL chemo infusion  80  mg/m2 (Treatment Plan Actual) Intravenous Once Lloyd Huger, MD      . famotidine (PEPCID) tablet 20 mg  20 mg Per Tube BID Theodoro Grist, MD   20 mg at 05/28/15 1018  . feeding supplement (VITAL HIGH PROTEIN) liquid 1,000 mL  1,000 mL Per Tube Continuous Laverle Hobby, MD 60 mL/hr at 05/27/15 1430 1,000 mL at 05/27/15 1430  . fentaNYL (SUBLIMAZE) injection 25-100 mcg  25-100 mcg Intravenous Q2H PRN Wilhelmina Mcardle, MD   50 mcg at 05/28/15 0259  . free water 250 mL  250 mL Per Tube Q4H Laverle Hobby, MD   250 mL at 05/28/15 1145  . heparin injection 5,000 Units  5,000 Units Subcutaneous Q12H Laverle Hobby, MD   5,000 Units at 05/28/15 1017  . insulin aspart (novoLOG) injection 0-15 Units  0-15 Units Subcutaneous 6 times per day Wilhelmina Mcardle, MD   3 Units at 05/28/15 [phone]  . insulin aspart (novoLOG) injection  5 Units  5 Units Subcutaneous Q4H Theodoro Grist, MD      . insulin detemir (LEVEMIR) injection 15 Units  15 Units Subcutaneous Q24H Laverle Hobby, MD   15 Units at 05/28/15 1057  . ipratropium-albuterol (DUONEB) 0.5-2.5 (3) MG/3ML nebulizer solution 3 mL  3 mL Nebulization Q6H Vishal Mungal, MD   3 mL at 05/28/15 0850  . losartan (COZAAR) tablet 100 mg  100 mg Oral Daily Theodoro Grist, MD   100 mg at 05/28/15 1018  . metoprolol (LOPRESSOR) tablet 100 mg  100 mg Per Tube BID Wilhelmina Mcardle, MD   100 mg at 05/28/15 1017  . midazolam (VERSED) injection 1-2 mg  1-2 mg Intravenous Q2H PRN Wilhelmina Mcardle, MD   2 mg at 05/28/15 2725  . ondansetron (ZOFRAN) tablet 4 mg  4 mg Oral Q6H PRN Harrie Foreman, MD       Or  . ondansetron [facility]) injection 4 mg  4 mg Intravenous Q6H PRN Harrie Foreman, MD      . Derrill Memo ON 05/29/2015] predniSONE (DELTASONE) tablet 20 mg  20 mg Oral Q breakfast Laverle Hobby, MD       Followed by  . [START ON 05/30/2015] predniSONE (DELTASONE) tablet 10 mg  10 mg Oral Q breakfast Laverle Hobby, MD      . pregabalin (LYRICA) capsule 50 mg  50 mg Per Tube TID Wilhelmina Mcardle, MD   50 mg at 05/28/15 1018    OBJECTIVE: Filed Vitals:   05/28/15 1137  BP:   Pulse:   Temp: 97.6 F (36.4 C)  Resp:      Body mass index is 36.83 kg/(m^2).    ECOG FS:4 - Bedbound  General:  intubated, but alert Eyes: Pink conjunctiva, anicteric sclera. HEENT:  ET tube in place. Lungs: Clear to auscultation bilaterally. Heart: Regular rate and rhythm. No rubs, murmurs, or gallops. Abdomen: Soft, nontender, nondistended. No organomegaly noted, normoactive bowel sounds. Musculoskeletal: No edema, cyanosis, or clubbing. Neuro: Lethargic.  Skin: No rashes or petechiae noted.   LAB RESULTS:  Lab Results  Component Value Date   NA 153* 05/28/2015   K 3.9 05/28/2015   CL 119* 05/28/2015   CO2 28 05/28/2015   GLUCOSE 207* 05/28/2015   BUN 71* 05/28/2015    CREATININE 1.10* 05/28/2015   CALCIUM 8.4* 05/28/2015   PROT 5.3* 05/27/2015   ALBUMIN 2.6* 05/27/2015   AST 35 05/27/2015   ALT 49 05/27/2015   ALKPHOS 43 05/27/2015   BILITOT 0.8 05/27/2015  GFRNONAA 51* 05/28/2015   GFRAA 59* 05/28/2015    Lab Results  Component Value Date   WBC 5.1 05/28/2015   NEUTROABS 4.1 04/24/2015   HGB 12.4 05/28/2015   HCT 38.7 05/28/2015   MCV 93.3 05/28/2015   PLT 62* 05/28/2015     STUDIES: Dg Chest 1 View  05/28/2015  CLINICAL DATA:  Hypoxia EXAM: CHEST 1 VIEW COMPARISON:  May 26, 2015 FINDINGS: Endotracheal tube tip is 4.6 cm above the carina. Nasogastric tube tip and side port below the diaphragm. Central catheter tip is in the superior cava. No pneumothorax. There is complete opacification of the right hemithorax with volume loss and probable effusion. There is elevation of the right hemidiaphragm. The left lung is clear. The heart size is within normal limits. Pulmonary vascularity on the left is within normal limits. Pulmonary vascularity on the right is obscured. There is atherosclerotic change throughout the aorta. IMPRESSION: Tube and catheter positions as described without pneumothorax. Complete opacification of the right hemithorax remains with evidence of volume loss and probable pleural effusion. Left lung clear. No change in cardiac silhouette. Electronically Signed   By: Lowella Grip III M.D.   On: 05/28/2015 07:18   Dg Chest 1 View  05/26/2015  CLINICAL DATA:  Wheezing and shortness of breath EXAM: CHEST 1 VIEW COMPARISON:  Portable chest x-ray of today's date FINDINGS: There is persistent 0 opacification of the mid and upper thirds of the right hemi thorax. Only a small amount of aerated lung is visible in the lower hemi thorax. Pleural fluid on the right is suspected. The cardiac silhouette is mildly enlarged. The left lung is well-expanded. There is no focal infiltrate or pleural effusion on the left. The endotracheal tube tip  projects 4.2 cm above the carina. The PICC line tip on the left overlies the junction of the proximal and midportions of the SVC. IMPRESSION: Fairly stable appearance of the chest since the previous study with opacification of the upper 2/3 of the right hemithorax consistent with atelectasis-pneumonia and left pleural effusion. The left lung remains clear. Electronically Signed   By: David  [place]ique M.D.   On: 05/26/2015 13:51   Ct Chest Wo Contrast  05/20/2015  CLINICAL DATA:  69 year old who presented with acute hypercapnic respiratory failure, current history of COPD and CHF, stage 3 chronic kidney disease, with an enlarging right pleural effusion and right paratracheal soft tissue on chest x-ray yesterday. EXAM: CT CHEST WITHOUT CONTRAST TECHNIQUE: Multidetector CT imaging of the chest was performed following the standard protocol without IV contrast. Intravenous contrast was not administered due to the patient's chronic kidney disease. COMPARISON:  No prior CT.  Chest x-rays 05/02/2015 and earlier. FINDINGS: Best seen on coronal reformatted images is abrupt occlusion of the right upper lobe bronchus and the right bronchus intermedius centrally. As a result, there is dense consolidation in the entire right lung with a areas of hypoattenuation giving a "drowned lung" appearance. Without intravenous contrast, it is difficult to visualize the large obstructing mass as it has similar attenuation to the consolidated lung. There is an associated small to moderate sized right pleural effusion. No pulmonary parenchymal nodules or masses involving the left lung. Linear scarring involving the medial left lower lobe. No confluent airspace consolidation in the left lung. No left pleural effusion. Apparent pleural thickening throughout the left hemithorax is related to abundant subpleural fat. Bulky right paratracheal conglomerate lymphadenopathy in station 2R and station 4R measuring approximately 5.6 x 5.0 x 7.4 cm.  Enlarged  lymph nodes elsewhere in the right side of the mediastinum. Enlarged right retroclavicular lymph nodes, the largest measuring approximately 2.8 x 3.8 x 3.1 cm. The bulky mediastinal lymphadenopathy compresses the superior vena cava, and there is a prominent right internal mammary vein collateral. Heart size upper normal. No pericardial effusion. Mild LAD and right coronary artery atherosclerosis. Moderate atherosclerosis involving the thoracic and upper abdominal aorta and their visualized branches. Approximate 3 cm cyst involving the posterior segment right lobe of liver. Within the limits of the unenhanced technique, no solid mass involving visualized liver. Approximate 1.6 x 2.6 x 2.2 cm low-attenuation mass involving the left adrenal gland. Solitary punctate calcification involving the proximal body of the pancreas. Visualized upper abdomen otherwise unremarkable for the unenhanced technique. IMPRESSION: 1. Obstructing central mass involving the right lung with occlusion of the right upper lobe bronchus and the bronchus intermedius with complete atelectasis of the right lung. The mass is difficult to visualize as it is of similar attenuation to the atelectatic lung. Bronchoscopy is recommended in further evaluation and for diagnosis. 2. Small to moderate-sized right pleural effusion. 3. Metastatic mediastinal lymphadenopathy as detailed above, the largest conglomerate nodal mass in the right paratracheal region. 4. Mild compression of the superior vena cava with a prominent right internal mammary vein collateral. 5. Approximate 3 cm cyst involving the posterior segment right lobe of the visualized liver. 6. Approximate 2.6 cm low-attenuation mass involving the left adrenal gland, likely adenoma. Electronically Signed   By: Evangeline Dakin M.D.   On: 05/20/2015 12:41   US Renal  05/21/2015  CLINICAL DATA:  Acute renal failure EXAM: RENAL / URINARY TRACT ULTRASOUND COMPLETE COMPARISON:  None.  FINDINGS: Right Kidney: Length: 11 cm. Echogenicity within normal limits. No mass or hydronephrosis visualized. There is cortical thinning probable due to atrophy Left Kidney: Length: 12.2 cm. Cortical thinning is noted probable due to atrophy. Echogenicity within normal limits. No mass or hydronephrosis visualized. Bladder: The urinary bladder is decompressed with Foley catheter. IMPRESSION: 1. No hydronephrosis. No renal calculi. Bilateral cortical thinning probable due to atrophy. Decompressed urinary bladder with Foley catheter. Electronically Signed   By: Lahoma Crocker M.D.   On: 05/21/2015 09:25   Dg Chest Port 1 View  05/26/2015  CLINICAL DATA:  Respiratory failure. EXAM: PORTABLE CHEST 1 VIEW COMPARISON:  05/25/2015 .  CT 05/20/2015. FINDINGS: Endotracheal tube and NG tube noted in stable position. Interim increase consolidation in the right upper lobe. Persistent right pleural effusion. Left lung is clear. Stable cardiomegaly. No pneumothorax. No acute osseus abnormality P IMPRESSION: Interim increase in consolidation of the right upper lobe with persistent right pleural effusion. Persistent occlusion of the right mainstem bronchus appears to be present. Electronically Signed   By: [place]   On: 05/26/2015 07:13   Dg Chest Port 1 View  05/25/2015  CLINICAL DATA:  Respiratory failure. EXAM: PORTABLE CHEST 1 VIEW COMPARISON:  05/21/2015 FINDINGS: The ET tube tip is above the carina. The nasogastric tube tip is below the GE junction. Normal heart size. Aortic atherosclerosis. Partially loculated right pleural effusion is again identified and appears unchanged from the previous exam. Left lung is clear. IMPRESSION: 1. Loculated right pleural effusion is stable. 2. Left lung is clear. Electronically Signed   By: Kerby Moors M.D.   On: 05/25/2015 09:03   Dg Chest Port 1 View  05/21/2015  CLINICAL DATA:  Respiratory failure, intubated EXAM: PORTABLE CHEST 1 VIEW COMPARISON:  05/20/2015  FINDINGS: Cardiomegaly again noted. Stable endotracheal  and NG tube position. Again noted almost complete opacification of the right hemi thorax. Chronic elevation of the right hemidiaphragm. Mild left basilar atelectasis. No convincing pulmonary edema. No pneumothorax. Atherosclerotic calcifications of thoracic aorta again noted. IMPRESSION: Stable endotracheal and NG tube position. Again noted almost complete opacification of the right hemi thorax. Chronic elevation of the right hemidiaphragm. Mild left basilar atelectasis. No convincing pulmonary edema. No pneumothorax. Atherosclerotic calcifications of thoracic aorta again noted. Electronically Signed   By: Lahoma Crocker M.D.   On: 05/21/2015 08:22   Dg Chest Port 1 View  05/20/2015  CLINICAL DATA:  Acute respiratory failure with hypoxia. Status post bronchoscopy and line placement. EXAM: PORTABLE CHEST 1 VIEW COMPARISON:  05/20/2015 at 1302 hours FINDINGS: Following bronchoscopy, there is now partial aeration of the right lung noted in the right upper to mid lung. There is also some aeration now noted in the right lower lung with a band of opacity noted across the mid lung bordering the minor fissure. Left lung is hyperexpanded but essentially clear. No pneumothorax. Endotracheal tube is stable with its tip 4.7 cm above the carina. Orogastric tube passes below the diaphragm. IMPRESSION: 1. Improved right lung aeration following bronchoscopy. No pneumothorax. 2. No other change. Electronically Signed   By: Lajean Manes M.D.   On: 05/20/2015 17:42   Dg Chest Port 1 View  05/20/2015  CLINICAL DATA:  Hypoxia EXAM: PORTABLE CHEST 1 VIEW COMPARISON:  Chest CT obtained earlier in the day; chest radiograph May 19, 2015 FINDINGS: Endotracheal tube tip is 4.6 cm above the carina. Nasogastric tube tip and side port are below the diaphragm. No pneumothorax. There is now complete opacification of the right hemi thorax, felt to be due to a combination of effusion  and consolidation. Left lung is clear. Heart size is within normal limits. There is atherosclerotic change in the aorta. Mass lesions seen on CT are obscured on the right by the diffuse consolidation and effusion. IMPRESSION: Tube positions as described without pneumothorax. Left lung clear. Complete opacification on the right. Cardiac silhouette within normal limits. Atherosclerotic calcification noted. Electronically Signed   By: Lowella Grip III M.D.   On: 05/20/2015 13:18   Dg Chest Portable 1 View  05/07/2015  CLINICAL DATA:  69 year old female with increasing shortness of breath Coll the today. EXAM: PORTABLE CHEST 1 VIEW COMPARISON:  Chest x-ray 04/24/2015. FINDINGS: Enlarging right pleural effusion. Extensive opacities throughout the right mid to lower lung, which may simply reflect passive atelectasis, however, underlying airspace consolidation or underlying mass is not excluded. Persistent prominence of right paratracheal soft tissue highly concerning for lymphadenopathy. Left lung appears relatively clear, although the left base is poorly visualized medially (likely technique related). No evidence of pulmonary edema. Heart size is normal. Atherosclerotic calcifications in the thoracic aorta. IMPRESSION: 1. Overall, there has been significant progression compared to the prior chest x-ray from 04/24/2015, with enlarging now a large right pleural effusion and worsening aeration throughout the right lung. This is unusual in the setting of a treated pneumonia, and given the presence of paratracheal soft tissue thickening on the right which is suspicious for lymphadenopathy, underlying neoplasm is strongly suspected. Further evaluation with contrast enhanced chest CT in is strongly recommended in the near future to evaluate for underlying neoplasm. 2. Atherosclerosis. Electronically Signed   By: Vinnie Langton M.D.   On: 05/16/2015 01:27   Dg Abd Portable 1v  05/20/2015  CLINICAL DATA:  OG an  indentation. EXAM: PORTABLE ABDOMEN - 1  VIEW COMPARISON:  None. FINDINGS: Enteric tube tip is adequately positioned in the body of the stomach. Mildly prominent gas-filled loops of small bowel are noted within the right lower quadrant. Overall bowel gas pattern is indeterminate. No evidence of free intraperitoneal air seen. Large dense opacity noted at the right lung base. IMPRESSION: 1. OG tube appears adequately positioned with tip in the region of the stomach body. 2. Large dense opacity at the right lung base, incompletely imaged, which could be consolidation or effusion. Electronically Signed   By: Franki Cabot M.D.   On: 05/20/2015 13:19    ASSESSMENT:  Small cell lung cancer.   PLAN:    1. Small cell lung cancer: Patient is likely stage IV given her suspicious adrenal lesion, but full staging workup cannot be completed given her acute illness. Patient has had multiple failed attempts at extubation likely secondary to external compression of her mass. Proceed with cycle 1, day 3 of cisplatin and etoposide today. Etoposide only. This concludes cycle 1 of treatment. Cycle 2 will occur in approximately 3 weeks. She will ultimately require port placement once she is extubated. XRT would be helpful, but there are patient safety concerns transporting to radiation oncology.   2. Thrombocytopenia: Unclear etiology, but most likely multifactorial secondary to acute illness. Proceed with chemotherapy as above cautiously and check daily CBC.  3. Tracheostomy: Agree that it is unsafe for surgery at this time given her thrombocytopenia which could decline after receiving chemotherapy this week. Case discussed with ENT and appreciate their input.  4. Mechanical ventilation: Repeat extubation per pulmonology. Will repeat CT scan chest of next week if patient remains intubated.   Will follow.   Lloyd Huger, MD   05/28/2015 12:51 PM

## 2015-05-28 NOTE — Progress Notes (Signed)
05/28/2015 11:18 AM  [facility] Plan [id number]  No change in history of physical since yesterday PM.   Temp:  [97.3 F (36.3 C)-97.6 F (36.4 C)] 97.6 F (36.4 C) (11/02 0800) Pulse Rate:  [51-112] 82 (11/02 0900) Resp:  [14-31] 20 (11/02 0900) BP: (124-146)/(62-88) 140/71 mmHg (11/02 0900) SpO2:  [75 %-93 %] 93 % (11/02 0900) FiO2 (%):  [28 %-35 %] 35 % (11/02 0855) Weight:  [106.7 kg (235 lb 3.7 oz)] 106.7 kg (235 lb 3.7 oz) (11/02 0500),     Intake/Output Summary (Last 24 hours) at 05/28/15 1118 Last data filed at 05/28/15 0900  Gross per 24 hour  Intake 2083.93 ml  Output   1255 ml  Net 828.93 ml    Results for orders placed or performed during the hospital encounter of 05/15/2015 (from the past 24 hour(s))  Glucose, capillary     Status: Abnormal   Collection Time: 05/27/15 11:58 AM  Result Value Ref Range   Glucose-Capillary 234 (H) 65 - 99 mg/dL  Comprehensive metabolic panel     Status: Abnormal   Collection Time: 05/27/15  2:57 PM  Result Value Ref Range   Sodium 151 (H) 135 - 145 mmol/L   Potassium 3.9 3.5 - 5.1 mmol/L   Chloride 116 (H) 101 - 111 mmol/L   CO2 28 22 - 32 mmol/L   Glucose, Bld 233 (H) 65 - 99 mg/dL   BUN 67 (H) 6 - 20 mg/dL   Creatinine, Ser 0.97 0.44 - 1.00 mg/dL   Calcium 8.3 (L) 8.9 - 10.3 mg/dL   Total Protein 5.3 (L) 6.5 - 8.1 g/dL   Albumin 2.6 (L) 3.5 - 5.0 g/dL   AST 35 15 - 41 U/L   ALT 49 14 - 54 U/L   Alkaline Phosphatase 43 38 - 126 U/L   Total Bilirubin 0.8 0.3 - 1.2 mg/dL   GFR calc non Af Amer 59 (L) >60 mL/min   GFR calc Af Amer >60 >60 mL/min   Anion gap 7 5 - 15  CBC     Status: Abnormal   Collection Time: 05/27/15  2:57 PM  Result Value Ref Range   WBC 5.0 3.6 - 11.0 K/uL   RBC 4.24 3.80 - 5.20 MIL/uL   Hemoglobin 12.6 12.0 - 16.0 g/dL   HCT 39.7 35.0 - 47.0 %   MCV 93.6 80.0 - 100.0 fL   MCH 29.6 26.0 - 34.0 pg   MCHC 31.6 (L) 32.0 - 36.0 g/dL   RDW 18.5 (H) 11.5 - 14.5 %   Platelets 68 (L) 150 - 440 K/uL   Glucose, capillary     Status: Abnormal   Collection Time: 05/27/15  3:35 PM  Result Value Ref Range   Glucose-Capillary 208 (H) 65 - 99 mg/dL  Glucose, capillary     Status: Abnormal   Collection Time: 05/27/15  7:41 PM  Result Value Ref Range   Glucose-Capillary 221 (H) 65 - 99 mg/dL  Glucose, capillary     Status: Abnormal   Collection Time: 05/28/15 12:17 AM  Result Value Ref Range   Glucose-Capillary 208 (H) 65 - 99 mg/dL  CBC     Status: Abnormal   Collection Time: 05/28/15  4:52 AM  Result Value Ref Range   WBC 5.1 3.6 - 11.0 K/uL   RBC 4.15 3.80 - 5.20 MIL/uL   Hemoglobin 12.4 12.0 - 16.0 g/dL   HCT 38.7 35.0 - 47.0 %   MCV 93.3 80.0 - 100.0 fL  MCH 30.0 26.0 - 34.0 pg   MCHC 32.1 32.0 - 36.0 g/dL   RDW 18.0 (H) 11.5 - 14.5 %   Platelets 62 (L) 150 - 440 K/uL  Basic metabolic panel     Status: Abnormal   Collection Time: 05/28/15  4:52 AM  Result Value Ref Range   Sodium 153 (H) 135 - 145 mmol/L   Potassium 3.9 3.5 - 5.1 mmol/L   Chloride 119 (H) 101 - 111 mmol/L   CO2 28 22 - 32 mmol/L   Glucose, Bld 207 (H) 65 - 99 mg/dL   BUN 71 (H) 6 - 20 mg/dL   Creatinine, Ser 1.10 (H) 0.44 - 1.00 mg/dL   Calcium 8.4 (L) 8.9 - 10.3 mg/dL   GFR calc non Af Amer 51 (L) >60 mL/min   GFR calc Af Amer 59 (L) >60 mL/min   Anion gap 6 5 - 15  Glucose, capillary     Status: Abnormal   Collection Time: 05/28/15  5:03 AM  Result Value Ref Range   Glucose-Capillary 165 (H) 65 - 99 mg/dL  Glucose, capillary     Status: Abnormal   Collection Time: 05/28/15  7:44 AM  Result Value Ref Range   Glucose-Capillary 187 (H) 65 - 99 mg/dL    SUBJECTIVE:  Failure to extubate/lung cancer   OBJECTIVE:  No change IMPRESSION:  Same  PLAN:  Discussed with Dr. Grayland Ormond this am.  With dropping platelet count and ongoing chemotherapy with unsure outcome, we both agree that tracheostomy would be dangerous and have serious bleeding and pulmonary risks.  We recommend continue chemo and repeat  chest CT in a week to see if tumor size is shrinking.  If it is and platelet count improves, and she cannot be extubated, we can readdress the tracheostomy.  Feel free to call me with any questions.    Jashae Wiggs T 05/28/2015, 11:18 AM

## 2015-05-28 NOTE — Progress Notes (Signed)
[facility] Progess Note   ASSESSMENT/PLAN   Patient is a 69 year old female with newly diagnosed right complete lung atelectasis due to small cell lung cancer, which is newly diagnosed on this admission. The patient is vent dependent. Due to the right lung atelectasis. She has completed course of chemotherapy, trach is pending.   PULMONARY  A: -Acute respiratory failure due to complete right lung atelectasis from newly diagnosed SCLC.  -Pleural effusion.  -Vent dependent, has not been able to tolerate any weaning trial, and appears far from any chance of safely extubating.  --Review of CXR images today shows unchanged right lung atelectasis. Will not attempt weaning trial this am due to this.  --Pt has completed first course of chemo.   P:   -ENT consulted for tracheostomy.   CARDIOVASCULAR -stable.   RENAL CKD, stable.  -Hypernatremia, continue free water replacement. Added .45 saline.   GASTROINTESTINAL Continue pepcid tube feeds for Gi prophylaxis.   HEMATOLOGIC Stable.   INFECTIOUS A:--  BCx2 10/24; negative.  Bronch Cx. 05/20/15>> normal flora. AFB and fungi pending.    ENDOCRINE A:  Diabetes, hyperglycemia.  P:   Continue every 4 scheduled insulin as well as sliding scale. Her blood sugars appear to be slightly improved with this regimen.  NEUROLOGIC A: Anxiety.     MAJOR EVENTS/TEST RESULTS: Bronch 10/25 with forceps biopsy of RUL>> SCLC.   INDWELLING DEVICES:: Endotracheal tube: 10/25. CVC triple lumen: Right femoral: 10/25 Double-lumen PICC line: 10/31.  ---------------------------------------   ----------------------------------------   Name: ALIN HUTCHINS MRN: [id number] DOB: [birth year]    ADMISSION DATE:  05/23/2015    SUBJECTIVE:   Pt currently on the ventilator, can not provide history or review of systems.   Review of Systems:  Patient is currently on the ventilator and cannot provide a review of  systems.  VITAL SIGNS: Temp:  [97.3 F (36.3 C)-97.6 F (36.4 C)] 97.6 F (36.4 C) (11/02 0800) Pulse Rate:  [51-112] 82 (11/02 0900) Resp:  [14-31] 20 (11/02 0900) BP: (124-146)/(62-88) 140/71 mmHg (11/02 0900) SpO2:  [75 %-93 %] 93 % (11/02 0900) FiO2 (%):  [28 %-35 %] 35 % (11/02 0855) Weight:  [106.7 kg (235 lb 3.7 oz)] 106.7 kg (235 lb 3.7 oz) (11/02 0500) HEMODYNAMICS:   VENTILATOR SETTINGS: Vent Mode:  [-] PRVC FiO2 (%):  [28 %-35 %] 35 % Set Rate:  [14 bmp] 14 bmp Vt Set:  [500 mL] 500 mL PEEP:  [5 cmH20] 5 cmH20 Plateau Pressure:  [22 cmH20-27 cmH20] 22 cmH20 INTAKE / OUTPUT:  Intake/Output Summary (Last 24 hours) at 05/28/15 1102 Last data filed at 05/28/15 0900  Gross per 24 hour  Intake 2083.93 ml  Output   1255 ml  Net 828.93 ml    PHYSICAL EXAMINATION: Physical Examination:   VS: BP 140/71 mmHg  Pulse 82  Temp(Src) 97.6 F (36.4 C) (Axillary)  Resp 20  Ht '5\' 7"'$  (1.702 m)  Wt 106.7 kg (235 lb 3.7 oz)  BMI 36.83 kg/m2  SpO2 93%  General Appearance: No distress  Neuro:without focal findings, mental status normal. HEENT: PERRLA, EOM intact. Pulmonary: normal breath sounds, decreased on right side.  CardiovascularNormal S1,S2.  No m/r/g.   Abdomen: Benign, Soft, non-tender. Renal:  No costovertebral tenderness  GU:  Not performed at this time. Endocrine: No evident thyromegaly. Skin:   warm, no rashes, no ecchymosis  Extremities: normal, no cyanosis, clubbing.   LABS:   LABORATORY PANEL:   CBC  Recent Labs Lab  05/28/15 0452  WBC 5.1  HGB 12.4  HCT 38.7  PLT 62*    Chemistries   Recent Labs Lab 05/27/15 0450  05/27/15 1457 05/28/15 0452  NA  --   < > 151* 153*  K  --   < > 3.9 3.9  CL  --   < > 116* 119*  CO2  --   < > 28 28  GLUCOSE  --   < > 233* 207*  BUN  --   < > 67* 71*  CREATININE  --   < > 0.97 1.10*  CALCIUM  --   < > 8.3* 8.4*  MG 2.0  --   --   --   PHOS 4.0  --   --   --   AST  --   --  35  --   ALT  --    --  49  --   ALKPHOS  --   --  43  --   BILITOT  --   --  0.8  --   < > = values in this interval not displayed.   Recent Labs Lab 05/27/15 1158 05/27/15 1535 05/27/15 1941 05/28/15 0017 05/28/15 0503 05/28/15 0744  GLUCAP 234* 208* 221* 208* 165* 187*   No results for input(s): PHART, PCO2ART, PO2ART in the last 168 hours.  Recent Labs Lab 05/25/15 0503 05/26/15 0456 05/27/15 1457  AST 33 38 35  ALT 30 41 49  ALKPHOS 42 44 43  BILITOT 0.6 0.7 0.8  ALBUMIN 2.7* 2.8* 2.6*    Cardiac Enzymes No results for input(s): TROPONINI in the last 168 hours.  RADIOLOGY:  Dg Chest 1 View  05/28/2015  CLINICAL DATA:  Hypoxia EXAM: CHEST 1 VIEW COMPARISON:  May 26, 2015 FINDINGS: Endotracheal tube tip is 4.6 cm above the carina. Nasogastric tube tip and side port below the diaphragm. Central catheter tip is in the superior cava. No pneumothorax. There is complete opacification of the right hemithorax with volume loss and probable effusion. There is elevation of the right hemidiaphragm. The left lung is clear. The heart size is within normal limits. Pulmonary vascularity on the left is within normal limits. Pulmonary vascularity on the right is obscured. There is atherosclerotic change throughout the aorta. IMPRESSION: Tube and catheter positions as described without pneumothorax. Complete opacification of the right hemithorax remains with evidence of volume loss and probable pleural effusion. Left lung clear. No change in cardiac silhouette. Electronically Signed   By: Lowella Grip III M.D.   On: 05/28/2015 07:18   Dg Chest 1 View  05/26/2015  CLINICAL DATA:  Wheezing and shortness of breath EXAM: CHEST 1 VIEW COMPARISON:  Portable chest x-ray of today's date FINDINGS: There is persistent 0 opacification of the mid and upper thirds of the right hemi thorax. Only a small amount of aerated lung is visible in the lower hemi thorax. Pleural fluid on the right is suspected. The cardiac  silhouette is mildly enlarged. The left lung is well-expanded. There is no focal infiltrate or pleural effusion on the left. The endotracheal tube tip projects 4.2 cm above the carina. The PICC line tip on the left overlies the junction of the proximal and midportions of the SVC. IMPRESSION: Fairly stable appearance of the chest since the previous study with opacification of the upper 2/3 of the right hemithorax consistent with atelectasis-pneumonia and left pleural effusion. The left lung remains clear. Electronically Signed   By: David  Martinique M.D.  On: 05/26/2015 13:51       --Marda Stalker, MD.  Pager [phone] [facility] Office Number: [phone]  Patricia Pesa, M.D.  Vilinda Boehringer, M.D.  Merton Border, M.D  [place].  I have personally obtained a history, examined the patient, evaluated laboratory and imaging results, formulated the assessment and plan and placed orders. The Patient requires high complexity decision making for assessment and support, frequent evaluation and titration of therapies, application of advanced monitoring technologies and extensive interpretation of multiple databases. The patient has critical illness that could lead imminently to failure of 1 or more organ systems and requires the highest level of physician preparedness to intervene.  Critical Care Time devoted to patient care services described in this note is 35 minutes and is exclusive of time spent in procedures.

## 2015-05-28 NOTE — Progress Notes (Signed)
Nutrition Follow-up    INTERVENTION:   EN: recommend continuing TF at current goal rate as tolerated by pt; current TF plus free water flushes provides 2.7 Liters of free water per day Coordination of Care: hypernatremia was addressed during ICU rounds with MD Ashby Dawes and also with MD Ether Griffins, IVF being ordered, free water flushes to remain at present frequency, work-up in progress  NUTRITION DIAGNOSIS:   Inadequate oral intake related to acute illness as evidenced by NPO status. Being addressed via TF  GOAL:   Provide needs based on ASPEN/SCCM guidelines  MONITOR:    (Energy Intake, Anthropometrics, Electrolyte/Renal Profile, Digestive System, Electrolyte/Renal Profile)  REASON FOR ASSESSMENT:   Consult Enteral/tube feeding initiation and management  ASSESSMENT:    Pt remains on vent, plan for trach when able, not placed today due to thrombocytopenia; pt receiving chemo  EN: tolerating Vital High Protein at rate of 60 ml/hr, free water 250 mL q 4 hours  Digestive System: residuals 25 mL, no signs of TF intolerance, +BM this AM  Electrolyte and Renal Profile:  Recent Labs Lab 05/25/15 0503 05/26/15 0456 05/27/15 0450 05/27/15 0500 05/27/15 1457 05/28/15 0452  BUN 58* 65*  --  61* 67* 71*  CREATININE 1.07* 1.12*  --  0.96 0.97 1.10*  NA 147* 150*  --  150* 151* 153*  K 4.1 3.9  --  3.8 3.9 3.9  MG 1.9 1.9 2.0  --   --   --   PHOS 3.7 4.0 4.0  --   --   --    Glucose Profile:  Recent Labs  05/28/15 0017 05/28/15 0503 05/28/15 0744  GLUCAP 208* 165* 187*   Meds: 1/2 NS at 75 ml/hr, decadron, ss novolog, levemir, prednisone  Height:   Ht Readings from Last 1 Encounters:  05/16/2015 '5\' 7"'$  (1.702 m)    Weight:   Wt Readings from Last 1 Encounters:  05/28/15 235 lb 3.7 oz (106.7 kg)   Filed Weights   05/26/15 0400 05/27/15 0601 05/28/15 0500  Weight: 238 lb 1.6 oz (108 kg) 244 lb 14.9 oz (111.1 kg) 235 lb 3.7 oz (106.7 kg)    BMI:  Body mass  index is 36.83 kg/(m^2).  Estimated Nutritional Needs:   Kcal:  3790-2409 kcals (11-14 kcals/kg) using current wt of 105.6 kg  Protein:  92-122 g (1.5-2.0 g/kg)   Fluid:  1525-1830 mL (25-30 ml/kg)   EDUCATION NEEDS:   No education needs identified at this time  [place], [place], LDN [phone] Pager

## 2015-05-28 NOTE — Care Management Note (Addendum)
Case Management Note  Patient Details  Name: ALLIEN MELBERG MRN: [id number] Date of Birth: 04-Apr-1946  Subjective/Objective:  Admitted with shortness of breath from home.  DX: Acute respiratory failure due to complete right lung atelectasis from newly diagnosed small cell lung ca. New diagnosis this admission.  Receiving chemotherapy. Vent dependent with failed weaning trials. She is pending trach placement.  There are hopes that the chemo will shrink lung nodule to expedite weaning and improve breathing.                  Action/Plan: Following progression. Most likely will need SNF at discharge.   Expected Discharge Date:                  Expected Discharge Plan:     In-House Referral:     Discharge planning Services     Post Acute Care Choice:    Choice offered to:     DME Arranged:    DME Agency:     HH Arranged:    HH Agency:     Status of Service:  In process, will continue to follow  Medicare Important Message Given:    Date Medicare IM Given:    Medicare IM give by:    Date Additional Medicare IM Given:    Additional Medicare Important Message give by:     If discussed at [place] of Stay Meetings, dates discussed:    Additional Comments:  Jolly Mango, RN 05/28/2015, 1:53 PM

## 2015-05-28 NOTE — Progress Notes (Signed)
[facility] [place] NAME: Emily Pratt    MR#:  [id number]  DATE OF BIRTH:  Feb 23, 1946  SUBJECTIVE:  CHIEF COMPLAINT:  Intubated , comfortable, patient would like to have TT placed in case it is needed, but Dr. Tami Ribas discussed case with Dr. Grayland Ormond and because of thrombocytopenia, no tracheostomy tube placement was recommended at this time with revisit issue in about a week. Patient feels comfortable, failed multiple extubation attempts. Chest x-ray shows no improvement today   REVIEW OF SYSTEMS:   Unable to obtain  DRUG ALLERGIES:   Allergies  Allergen Reactions  . Atorvastatin Other (See Comments)    Does not remember why she had Intolerance to Lipitor.  . Fentanyl Itching  . Rosuvastatin Anxiety    Chest tigtness and feeling as if she had heartburn.    VITALS:  Blood pressure 140/71, pulse 82, temperature 97.6 F (36.4 C), temperature source Axillary, resp. rate 20, height '5\' 7"'$  (1.702 m), weight 106.7 kg (235 lb 3.7 oz), SpO2 93 %.  PHYSICAL EXAMINATION:  GENERAL:  69 y.o.-year-old patient lying in the bed, intubated, somnolent, easily awakened. Opens eyes , communicates, comfortable EYES: Pupils equal, round, reactive to light and accommodation. No scleral icterus.  HEENT: Head atraumatic, normocephalic. Oropharynx and nasopharynx clear. ETT in place NECK:  Supple, no jugular venous distention. No thyroid enlargement, no tenderness.  LUNGS: Better air entrance on the right, although still diminished breath sounds on the right , intermittent wheezing and rhonchi , good air movements on the left  CARDIOVASCULAR: S1, S2 normal. No murmurs, rubs, or gallops.  ABDOMEN: Soft, nontender, nondistended. Bowel sounds present. No organomegaly or mass.  EXTREMITIES: +1 pedal edema, cyanosis, or clubbing.  NEUROLOGIC: CN 2-12 intact, strength 5/5, somnolent but does follow commands PSYCHIATRIC: Somnolent SKIN: Sacral decubitus  ulcer   LABORATORY PANEL:   CBC  Recent Labs Lab 05/28/15 0452  WBC 5.1  HGB 12.4  HCT 38.7  PLT 62*   ------------------------------------------------------------------------------------------------------------------  Chemistries   Recent Labs Lab 05/27/15 0450  05/27/15 1457 05/28/15 0452  NA  --   < > 151* 153*  K  --   < > 3.9 3.9  CL  --   < > 116* 119*  CO2  --   < > 28 28  GLUCOSE  --   < > 233* 207*  BUN  --   < > 67* 71*  CREATININE  --   < > 0.97 1.10*  CALCIUM  --   < > 8.3* 8.4*  MG 2.0  --   --   --   AST  --   --  35  --   ALT  --   --  49  --   ALKPHOS  --   --  43  --   BILITOT  --   --  0.8  --   < > = values in this interval not displayed. ------------------------------------------------------------------------------------------------------------------  Cardiac Enzymes No results for input(s): TROPONINI in the last 168 hours. ------------------------------------------------------------------------------------------------------------------  RADIOLOGY:  Dg Chest 1 View  05/28/2015  CLINICAL DATA:  Hypoxia EXAM: CHEST 1 VIEW COMPARISON:  May 26, 2015 FINDINGS: Endotracheal tube tip is 4.6 cm above the carina. Nasogastric tube tip and side port below the diaphragm. Central catheter tip is in the superior cava. No pneumothorax. There is complete opacification of the right hemithorax with volume loss and probable effusion. There is elevation of the right hemidiaphragm. The left lung  is clear. The heart size is within normal limits. Pulmonary vascularity on the left is within normal limits. Pulmonary vascularity on the right is obscured. There is atherosclerotic change throughout the aorta. IMPRESSION: Tube and catheter positions as described without pneumothorax. Complete opacification of the right hemithorax remains with evidence of volume loss and probable pleural effusion. Left lung clear. No change in cardiac silhouette. Electronically Signed   By:  Lowella Grip III M.D.   On: 05/28/2015 07:18   Dg Chest 1 View  05/26/2015  CLINICAL DATA:  Wheezing and shortness of breath EXAM: CHEST 1 VIEW COMPARISON:  Portable chest x-ray of today's date FINDINGS: There is persistent 0 opacification of the mid and upper thirds of the right hemi thorax. Only a small amount of aerated lung is visible in the lower hemi thorax. Pleural fluid on the right is suspected. The cardiac silhouette is mildly enlarged. The left lung is well-expanded. There is no focal infiltrate or pleural effusion on the left. The endotracheal tube tip projects 4.2 cm above the carina. The PICC line tip on the left overlies the junction of the proximal and midportions of the SVC. IMPRESSION: Fairly stable appearance of the chest since the previous study with opacification of the upper 2/3 of the right hemithorax consistent with atelectasis-pneumonia and left pleural effusion. The left lung remains clear. Electronically Signed   By: David  Martinique M.D.   On: 05/26/2015 13:51    EKG:   Orders placed or performed during the hospital encounter of 05/10/2015  . EKG 12-Lead  . EKG 12-Lead    ASSESSMENT AND PLAN:   1. Acute on chronic respiratory failure with hypoxia and Hypercapnia,  - Multifactorial due to large right-sided pleural effusion/ CHF/ COPD / mass- SCLC - Pulmonology and oncology is following, failed multiple extubation attempts.  - Continue nebulizer, steroids, antibiotics. Oncology initiated chemotherapy October 31st 2016 with cisplatin and etoposide with hopes to shrink the mass. Timing of shrinkage  is unclear, but air entrance into right lung has improved today, although chest x-ray does not show any improvement. Appreciate palliative care input , ENT saw patient and discussed this oncologist , no tracheostomy tube is recommended at this time but revisit issue in about 1 week   2. 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 Vital signs are good  3. Acute on chronic kidney disease:  - Due to ATN, hypovolemia and hypotension, kidney function is normal now - Nephrology signed off. Patient was restarted on losartan. Following kidney function closely  4. Essential hypertension:  - , continue metoprolol and losartan, norvasc, better blood pressure control  5. Diabetes mellitus type 2: Hold home medications Provide sliding scale insulin, blood glucose is ranging around 162- 200, now on NovoLog RTC to cover tube feeds,  advance  6.  Small cell lung cancer, likely stage IV per oncology - Status post bronchoscopy 10/25, pathology show small cell lung cancer - Long-term history of heavy smoking - pathology reports were discussed with the patient and sister in the past.   - Oncology initiated chemotherapy ,  PICC line for chemotherapy is placed 05/26/2015- Radiation oncology has been consulted and patient may benefit from radiology oncology intervention as well, since she is intubated. It might be difficult to perform. Following patient's platelet count closely with chemotherapy  7. Postobstructive pneumonia versus atelectasis - Blood cultures negative  to date, respiratory, BAL, AFB normal flora - Discontinue vancomycin and Zosyn, completed 7 day Levaquin course  8. Arrhythmia, SVT versus paroxysmal atrial fibrillation during bronchoscopy, now in sinus rhythm, continue metoprolol per cardiology's recommendations  9 thrombocytopenia, following closely, may need transfusions with chemotherapy for lung ca  10. Hypernatremia, patient is receiving advanced water flushes with tube feeds, but her sodium level is not improving. Apparently she has good urinary output, getting osmolarities of urine and serum to rule out diabetes insipidus, but poor diabetes control could also cause significant hypernatremia,  advancing insulin NovoLog every 4 hours dose, may need to add Lantus .   All the records are reviewed and case discussed with the patient's husband and sister at the bedside.  CODE STATUS: Full code  TOTAL CRITICAL CARE TIME TAKING CARE OF THIS PATIENT: 45 minutes.      Theodoro Grist M.D on 05/28/2015 at 12:08 PM  Between 7am to 6pm - Pager - [phone] After 6pm go to [email or web address] - password [email or web address] [facility]  Office  [phone]  CC: Primary care physician; Dagoberto Ligas, MD

## 2015-05-28 NOTE — Progress Notes (Signed)
Notified Dr. Ether Griffins of results of urine and serum osmolality as well as sodium level

## 2015-05-29 ENCOUNTER — Inpatient Hospital Stay: Payer: Medicare Other

## 2015-05-29 LAB — C DIFFICILE QUICK SCREEN W PCR REFLEX
C DIFFICLE (CDIFF) ANTIGEN: NEGATIVE
C Diff interpretation: NEGATIVE
C Diff toxin: NEGATIVE

## 2015-05-29 LAB — URINALYSIS COMPLETE WITH MICROSCOPIC (ARMC ONLY)
BILIRUBIN URINE: NEGATIVE
Glucose, UA: NEGATIVE mg/dL
Ketones, ur: NEGATIVE mg/dL
Nitrite: NEGATIVE
PH: 5 (ref 5.0–8.0)
PROTEIN: NEGATIVE mg/dL
SQUAMOUS EPITHELIAL / LPF: NONE SEEN
Specific Gravity, Urine: 1.012 (ref 1.005–1.030)

## 2015-05-29 LAB — CBC
HEMATOCRIT: 39.6 % (ref 35.0–47.0)
HEMOGLOBIN: 12.7 g/dL (ref 12.0–16.0)
MCH: 30.3 pg (ref 26.0–34.0)
MCHC: 32.1 g/dL (ref 32.0–36.0)
MCV: 94.4 fL (ref 80.0–100.0)
Platelets: 59 10*3/uL — ABNORMAL LOW (ref 150–440)
RBC: 4.2 MIL/uL (ref 3.80–5.20)
RDW: 18.6 % — AB (ref 11.5–14.5)
WBC: 6.6 10*3/uL (ref 3.6–11.0)

## 2015-05-29 LAB — GLUCOSE, CAPILLARY
Glucose-Capillary: 191 mg/dL — ABNORMAL HIGH (ref 65–99)
Glucose-Capillary: 170 mg/dL — ABNORMAL HIGH (ref 65–99)
Glucose-Capillary: 170 mg/dL — ABNORMAL HIGH (ref 65–99)
Glucose-Capillary: 161 mg/dL — ABNORMAL HIGH (ref 65–99)
Glucose-Capillary: 119 mg/dL — ABNORMAL HIGH (ref 65–99)

## 2015-05-29 LAB — BASIC METABOLIC PANEL
Anion gap: 4 — ABNORMAL LOW (ref 5–15)
BUN: 77 mg/dL — ABNORMAL HIGH (ref 6–20)
CHLORIDE: 119 mmol/L — AB (ref 101–111)
CO2: 28 mmol/L (ref 22–32)
Calcium: 8.2 mg/dL — ABNORMAL LOW (ref 8.9–10.3)
Creatinine, Ser: 1.03 mg/dL — ABNORMAL HIGH (ref 0.44–1.00)
GFR, EST NON AFRICAN AMERICAN: 55 mL/min — AB (ref >60)
Glucose, Bld: 205 mg/dL — ABNORMAL HIGH (ref 65–99)
POTASSIUM: 4.3 mmol/L (ref 3.5–5.1)
SODIUM: 151 mmol/L — AB (ref 135–145)

## 2015-05-29 LAB — HEMOGLOBIN A1C: HEMOGLOBIN A1C: 6.8 % — AB (ref 4.0–6.0)

## 2015-05-29 NOTE — Progress Notes (Signed)
Pharmacy Consult for Electrolyte Monitoring   Allergies  Allergen Reactions  . Atorvastatin Other (See Comments)    Does not remember why she had Intolerance to Lipitor.  . Fentanyl Itching  . Rosuvastatin Anxiety    Chest tigtness and feeling as if she had heartburn.    Patient Measurements: Height: '5\' 7"'$  (170.2 cm) Weight: 258 lb 2.5 oz (117.1 kg) IBW/kg (Calculated) : 61.6   Vital Signs: Temp: 98.2 F (36.8 C) (11/03 0800) Temp Source: Oral (11/03 0800) BP: 130/71 mmHg (11/03 0800) Pulse Rate: 81 (11/03 0800) Intake/Output from previous day: 11/02 0701 - 11/03 0700 In: 2220.5 [I.V.:1850.5; NG/GT:370] Out: 3100 [Urine:3100] Intake/Output from this shift: Total I/O In: 323.2 [I.V.:13.2; NG/GT:310] Out: -   Labs:  Recent Labs  05/27/15 1457 05/28/15 0452 05/29/15 0441  WBC 5.0 5.1 6.6  HGB 12.6 12.4 12.7  HCT 39.7 38.7 39.6  PLT 68* 62* 59*     Recent Labs  05/27/15 0450  05/27/15 1457 05/28/15 0452 05/28/15 1442 05/29/15 0441  NA  --   < > 151* 153* 150* 151*  K  --   < > 3.9 3.9  --  4.3  CL  --   < > 116* 119*  --  119*  CO2  --   < > 28 28  --  28  GLUCOSE  --   < > 233* 207*  --  205*  BUN  --   < > 67* 71*  --  77*  CREATININE  --   < > 0.97 1.10*  --  1.03*  CALCIUM  --   < > 8.3* 8.4*  --  8.2*  MG 2.0  --   --   --   --   --   PHOS 4.0  --   --   --   --   --   PROT  --   --  5.3*  --   --   --   ALBUMIN  --   --  2.6*  --   --   --   AST  --   --  35  --   --   --   ALT  --   --  49  --   --   --   ALKPHOS  --   --  43  --   --   --   BILITOT  --   --  0.8  --   --   --   < > = values in this interval not displayed. Estimated Creatinine Clearance: 70.1 mL/min (by C-G formula based on Cr of 1.03).    Recent Labs  05/28/15 2343 05/29/15 0404 05/29/15 0721  GLUCAP 174* 191* 170*    Medical History: Past Medical History  Diagnosis Date  . Anxiety   . Arthritis   . COPD (chronic obstructive pulmonary disease) ([place])   . CHF  (congestive heart failure) ([place])   . Hyperlipidemia   . Hypertension   . Diabetes mellitus without complication ([place])   . Chronic kidney disease   . Neuromuscular disorder ([facility])     Medications:  Scheduled:  . amLODipine  5 mg Oral Daily  . antiseptic oral rinse  7 mL Mouth Rinse QID  . budesonide (PULMICORT) nebulizer solution  0.5 mg Nebulization BID  . chlorhexidine gluconate  15 mL Mouth Rinse BID  . colchicine  0.6 mg Per Tube Daily  . famotidine  20 mg Per Tube BID  .  free water  250 mL Per Tube Q4H  . heparin subcutaneous  5,000 Units Subcutaneous Q12H  . insulin aspart  0-15 Units Subcutaneous 6 times per day  . insulin aspart  5 Units Subcutaneous Q4H  . insulin detemir  15 Units Subcutaneous Q24H  . ipratropium-albuterol  3 mL Nebulization Q6H  . losartan  100 mg Oral Daily  . metoprolol  100 mg Per Tube BID  . [START ON 05/30/2015] predniSONE  10 mg Oral Q breakfast  . pregabalin  50 mg Per Tube TID   Infusions:  . sodium chloride 75 mL/hr (05/29/15 0041)  . dexmedetomidine 1.1 mcg/kg/hr (05/29/15 0810)  . feeding supplement (VITAL HIGH PROTEIN) 1,000 mL (05/29/15 0810)    Assessment: Patient started on 1/2NS @ 41m on 11/2. Patient's sodium remains elevated however is down from 11/2.   Plan:  No additional supplementation warranted at this time.  Will reorder BMP with AM labs and obtain magnesium/phos if potassium is low.   Pharmacy will continue to monitor labs and replace electrolytes as needed.   MCurrie Paris PharmD

## 2015-05-29 NOTE — Progress Notes (Signed)
Resting well on precedex, arouses to voice.  Follows commands and calm at this time. NSR. VSS. Afebrile.

## 2015-05-29 NOTE — Progress Notes (Signed)
RN bladder scanned patient and result was 732m.  Foley irrigated with difficulty with a lot of resistance met and RN finally able to aspirate urine from foley.  Foley draining now with 300cc output of yellow cloudy urine.

## 2015-05-29 NOTE — Progress Notes (Addendum)
[facility] Progess Note   ASSESSMENT/PLAN   Patient is a 69 year old female with newly diagnosed right complete lung atelectasis due to small cell lung cancer, which is newly diagnosed on this admission. The patient is vent dependent. Due to the right lung atelectasis. She has completed course of chemotherapy, trach is being considered for next week. If the patient cannot be weaned off the ventilator.   Interim history: No acute events overnight. The patient has no particular complaints at this time. She continues to be on the vent, her breathing is comfortable. She answers questions and appears to be awake and alert. She appears to be frustrated over her current situation and anxious. This morning she appears to have better air entry in the right lung, the patient tolerated spontaneous mode at a PEEP of 8 and pressure support of 12, yesterday, she is been placed back on the ventilator overnight.   PULMONARY  A: -Acute respiratory failure due to complete right lung atelectasis from newly diagnosed SCLC. The patient has completed her first course of chemotherapy with 3 days of cisplatin and etoposide. Her respiratory function appears to be improved and the right lung atelectasis appears to be improving. -Pleural effusion.  -Vent dependent --Review of CXR images today shows continued atelectasis.  --Pt has completed first course of chemo.  -ENT consulted for tracheostomy. Recommended holding off for now. Given elevated risk due to thrombocytopenia. P: -We will continue weaning trials, and see if the patient can be extubated over the next week before any potential tracheostomy is considered again.  CARDIOVASCULAR -A. fib with RVR. Patient was previously on a Cardizem drip, this was changed to amiodarone, the rate is now well controlled. -We'll continue amiodarone drip and changed to oral per protocol.  RENAL CKD, stable.  -Hypernatremia, appears stable, continue  free water replacement and .45 saline.   GASTROINTESTINAL Continue pepcid tube feeds for Gi prophylaxis.   HEMATOLOGIC Stable.   INFECTIOUS A:--  BCx2 10/24; negative.  Bronch Cx. 05/20/15>> normal flora. AFB and fungi pending.    ENDOCRINE A:  Diabetes, hyperglycemia.  P:   Will discontinue every 4 hour insulin, but continued every 4 hours sliding scale insulin. Continue Levemir.  NEUROLOGIC A: Anxiety.     MAJOR EVENTS/TEST RESULTS: Bronch 10/25 with forceps biopsy of RUL>> SCLC.   INDWELLING DEVICES:: Endotracheal tube: 10/25. CVC triple lumen: Right femoral: 10/25 Double-lumen PICC line: 10/31.  ---------------------------------------   ----------------------------------------   Name: Emily Pratt MRN: [id number] DOB: 1945-09-14    ADMISSION DATE:  05/08/2015    SUBJECTIVE:   Pt currently on the ventilator, can not provide history or review of systems.   Review of Systems:  Patient is currently on the ventilator and cannot provide a review of systems.  VITAL SIGNS: Temp:  [97.4 F (36.3 C)-98.2 F (36.8 C)] 98.2 F (36.8 C) (11/03 0800) Pulse Rate:  [75-196] 81 (11/03 0800) Resp:  [13-25] 14 (11/03 0800) BP: (121-146)/(65-80) 130/71 mmHg (11/03 0800) SpO2:  [90 %-95 %] 94 % (11/03 0800) FiO2 (%):  [35 %] 35 % (11/03 0839) Weight:  [117.1 kg (258 lb 2.5 oz)] 117.1 kg (258 lb 2.5 oz) (11/03 0510) HEMODYNAMICS:   VENTILATOR SETTINGS: Vent Mode:  [-] Spontaneous FiO2 (%):  [35 %] 35 % Set Rate:  [14 bmp] 14 bmp Vt Set:  [500 mL] 500 mL PEEP:  [5 cmH20-8 cmH20] 8 cmH20 Plateau Pressure:  [18 cmH20] 18 cmH20 INTAKE / OUTPUT:  Intake/Output Summary (Last 24 hours)  at 05/29/15 0915 Last data filed at 05/29/15 0810  Gross per 24 hour  Intake 2365.7 ml  Output   2500 ml  Net -134.3 ml    PHYSICAL EXAMINATION: Physical Examination:   VS: BP 130/71 mmHg  Pulse 81  Temp(Src) 98.2 F (36.8 C) (Oral)  Resp 14  Ht '5\' 7"'$  (1.702 m)  Wt  117.1 kg (258 lb 2.5 oz)  BMI 40.42 kg/m2  SpO2 94%  General Appearance: No distress  Neuro:without focal findings, mental status normal. HEENT: PERRLA, EOM intact. Pulmonary: normal breath sounds, decreased on right side. But improved compared to yesterday. CardiovascularNormal S1,S2.  No m/r/g.   Abdomen: Benign, Soft, non-tender. Renal:  No costovertebral tenderness  GU:  Not performed at this time. Endocrine: No evident thyromegaly. Skin:   warm, no rashes, no ecchymosis  Extremities: normal, no cyanosis, clubbing.   LABS:   LABORATORY PANEL:   CBC  Recent Labs Lab 05/29/15 0441  WBC 6.6  HGB 12.7  HCT 39.6  PLT 59*    Chemistries   Recent Labs Lab 05/27/15 0450  05/27/15 1457  05/29/15 0441  NA  --   < > 151*  < > 151*  K  --   < > 3.9  < > 4.3  CL  --   < > 116*  < > 119*  CO2  --   < > 28  < > 28  GLUCOSE  --   < > 233*  < > 205*  BUN  --   < > 67*  < > 77*  CREATININE  --   < > 0.97  < > 1.03*  CALCIUM  --   < > 8.3*  < > 8.2*  MG 2.0  --   --   --   --   PHOS 4.0  --   --   --   --   AST  --   --  35  --   --   ALT  --   --  49  --   --   ALKPHOS  --   --  43  --   --   BILITOT  --   --  0.8  --   --   < > = values in this interval not displayed.   Recent Labs Lab 05/28/15 1417 05/28/15 1604 05/28/15 2007 05/28/15 2343 05/29/15 0404 05/29/15 0721  GLUCAP 201* 164* 190* 174* 191* 170*   No results for input(s): PHART, PCO2ART, PO2ART in the last 168 hours.  Recent Labs Lab 05/25/15 0503 05/26/15 0456 05/27/15 1457  AST 33 38 35  ALT 30 41 49  ALKPHOS 42 44 43  BILITOT 0.6 0.7 0.8  ALBUMIN 2.7* 2.8* 2.6*    Cardiac Enzymes No results for input(s): TROPONINI in the last 168 hours.  RADIOLOGY:  Dg Chest 1 View  05/29/2015  CLINICAL DATA:  Dyspnea, respiratory failure, COPD, lung mass EXAM: CHEST 1 VIEW COMPARISON:  Portable chest x-ray of May 28, 2015 FINDINGS: There is persistent near total atelectasis of the right lung  with some aeration noted in the lower hemi thorax. The left lung is clear. There is no significant mediastinal shift. The cardiac silhouette is normal in size. The endotracheal tube tip lies 4.6 cm above the carina. The esophagogastric tube tip projects below the inferior margin of the image. The right-sided PICC line tip projects over the midportion of the SVC. IMPRESSION: Persistent consolidation of the mid and upper right lung  with small amount of residual aerated right lower lung. The left lung is clear. The support tubes are in reasonable position. Electronically Signed   By: David  Martinique M.D.   On: 05/29/2015 07:20   Dg Chest 1 View  05/28/2015  CLINICAL DATA:  Hypoxia EXAM: CHEST 1 VIEW COMPARISON:  May 26, 2015 FINDINGS: Endotracheal tube tip is 4.6 cm above the carina. Nasogastric tube tip and side port below the diaphragm. Central catheter tip is in the superior cava. No pneumothorax. There is complete opacification of the right hemithorax with volume loss and probable effusion. There is elevation of the right hemidiaphragm. The left lung is clear. The heart size is within normal limits. Pulmonary vascularity on the left is within normal limits. Pulmonary vascularity on the right is obscured. There is atherosclerotic change throughout the aorta. IMPRESSION: Tube and catheter positions as described without pneumothorax. Complete opacification of the right hemithorax remains with evidence of volume loss and probable pleural effusion. Left lung clear. No change in cardiac silhouette. Electronically Signed   By: Lowella Grip III M.D.   On: 05/28/2015 07:18       --Marda Stalker, MD.  Pager [phone] [facility] Office Number: [phone]  Patricia Pesa, M.D.  Vilinda Boehringer, M.D.  Merton Border, M.D  [place].  I have personally obtained a history, examined the patient, evaluated laboratory and imaging results, formulated the assessment  and plan and placed orders. The Patient requires high complexity decision making for assessment and support, frequent evaluation and titration of therapies, application of advanced monitoring technologies and extensive interpretation of multiple databases. The patient has critical illness that could lead imminently to failure of 1 or more organ systems and requires the highest level of physician preparedness to intervene.  Critical Care Time devoted to patient care services described in this note is 35 minutes and is exclusive of time spent in procedures.

## 2015-05-29 NOTE — Progress Notes (Signed)
Nutrition Follow-up   INTERVENTION:   EN: recommend continuing current TF regimen Coordination of Care: discussed hypernatremia during ICU Rounds; pt currently receiving 2.7 Liters of free water from tube feeding and free water flushes in 24 hour period (does not include free water flushes with meds); currently receiving 1/2 NS at 75 ml/hr. Discussed possibility of changing IVF to D5W to provide free water during ICU rounds; MD wanting to continuing 1/2 NS at this time.    NUTRITION DIAGNOSIS:   Inadequate oral intake related to acute illness as evidenced by NPO status.  GOAL:   Provide needs based on ASPEN/SCCM guidelines  MONITOR:    (Energy Intake, Anthropometrics, Electrolyte/Renal Profile, Digestive System, Electrolyte/Renal Profile)  REASON FOR ASSESSMENT:   Consult Enteral/tube feeding initiation and management  ASSESSMENT:    Pt remains on vent, first course of chemo completed, tracheostomy placement on hold at present due to thrombocytopenia, on precedex  EN: tolerating Vital High Protein at rate of 60 ml/hr, 250 mL q 4 hour free water flush  Digestive System: abdomen soft, obese, BS hypoactive; +BM today; per RN, TF briefly held during the night for abdominal fullness/discomfort per pt request. Restarted this AM. Minimal residuals per report  Electrolyte and Renal Profile:  Recent Labs Lab 05/25/15 0503 05/26/15 0456 05/27/15 0450  05/27/15 1457 05/28/15 0452 05/28/15 1442 05/29/15 0441  BUN 58* 65*  --   < > 67* 71*  --  77*  CREATININE 1.07* 1.12*  --   < > 0.97 1.10*  --  1.03*  NA 147* 150*  --   < > 151* 153* 150* 151*  K 4.1 3.9  --   < > 3.9 3.9  --  4.3  MG 1.9 1.9 2.0  --   --   --   --   --   PHOS 3.7 4.0 4.0  --   --   --   --   --   < > = values in this interval not displayed. Glucose Profile:  Recent Labs  05/29/15 0404 05/29/15 0721 05/29/15 1120  GLUCAP 191* 170* 170*   Meds: ss novolog, aspart, levemir, prednisone, decadron, 1/2NS  at 75 ml/hr, precedex  Height:   Ht Readings from Last 1 Encounters:  05/02/2015 '5\' 7"'$  (1.702 m)    Weight:   Wt Readings from Last 1 Encounters:  05/29/15 258 lb 2.5 oz (117.1 kg)     BMI:  Body mass index is 40.42 kg/(m^2).  Estimated Nutritional Needs:   Kcal:  9179-1505 kcals (11-14 kcals/kg) using current wt of 105.6 kg  Protein:  92-122 g (1.5-2.0 g/kg)   Fluid:  1525-1830 mL (25-30 ml/kg)   EDUCATION NEEDS:   No education needs identified at this time   [place], [place], LDN [phone] Pager

## 2015-05-29 NOTE — Progress Notes (Addendum)
Dr. Boyce Medici present and RN made him aware that flexi seal was inserted earlier in shift for loose stools and that patient complains of burning in her female area and patient doesn't know if its her urethra or vagina.  RN and MD also discussed patient's abdominal distention.   MD gave order for UA and to check stool for cdiff and no further orders.

## 2015-05-29 NOTE — Progress Notes (Signed)
   05/29/15 1500  Clinical Encounter Type  Visited With Patient  Visit Type Initial  Consult/Referral To Chaplain  Spiritual Encounters  Spiritual Needs Emotional;Other (Comment)  Stress Factors  Patient Stress Factors None identified  Chaplain rounded in the unit and offered a compassionate presence and support to the patient. We watched the Dorian Pod show together. I will follow-up with her tomorrow. Chaplain Asami Lambright A. Cesar Alf Ext. [phone]

## 2015-05-29 NOTE — Progress Notes (Signed)
Dr. Ashby Dawes gave order for patient to have ice chips and to insert flexi seal for copious loose stools.

## 2015-05-29 NOTE — Progress Notes (Signed)
[place] [facility] [place] NAME: Emily Pratt    MR#:  [id number]  DATE OF BIRTH:  08/30/1945  SUBJECTIVE:  CHIEF COMPLAINT:  Intubated , comfortable, patient would like to have TT placed in case it is needed, but Dr. Tami Ribas discussed case with Dr. Grayland Ormond and because of thrombocytopenia, no tracheostomy tube placement was recommended at this time with revisit issue in about a week. Patient feels comfortable, failed multiple extubation attempts. Pt is not on sedation, pointed towards her abdomen- and signed as it is getting bigger. Ut she denies any pain. And had BM.  REVIEW OF SYSTEMS:   Unable to obtain  DRUG ALLERGIES:   Allergies  Allergen Reactions  . Atorvastatin Other (See Comments)    Does not remember why she had Intolerance to Lipitor.  . Fentanyl Itching  . Rosuvastatin Anxiety    Chest tigtness and feeling as if she had heartburn.    VITALS:  Blood pressure 143/65, pulse 86, temperature 98.2 F (36.8 C), temperature source Oral, resp. rate 15, height '5\' 7"'$  (1.702 m), weight 117.1 kg (258 lb 2.5 oz), SpO2 94 %.  PHYSICAL EXAMINATION:  GENERAL:  69 y.o.-year-old patient lying in the bed, intubated, somnolent, easily awakened. Opens eyes , communicates, comfortable EYES: Pupils equal, round, reactive to light and accommodation. No scleral icterus.  HEENT: Head atraumatic, normocephalic. Oropharynx and nasopharynx clear. ETT in place NECK:  Supple, no jugular venous distention. No thyroid enlargement, no tenderness.  LUNGS: Better air entrance on the right, although still diminished breath sounds on the right , intermittent wheezing and rhonchi , good air movements on the left , on ventilator support. CARDIOVASCULAR: S1, S2 normal. No murmurs, rubs, or gallops.  ABDOMEN: Soft, nontender, nondistended. Bowel sounds present. No organomegaly or mass.  EXTREMITIES: +1 pedal edema, cyanosis, or clubbing.  NEUROLOGIC: CN 2-12  intact, strength 5/5, somnolent but does follow commands PSYCHIATRIC: Somnolent SKIN: Sacral decubitus ulcer   LABORATORY PANEL:   CBC  Recent Labs Lab 05/29/15 0441  WBC 6.6  HGB 12.7  HCT 39.6  PLT 59*   ------------------------------------------------------------------------------------------------------------------  Chemistries   Recent Labs Lab 05/27/15 0450  05/27/15 1457  05/29/15 0441  NA  --   < > 151*  < > 151*  K  --   < > 3.9  < > 4.3  CL  --   < > 116*  < > 119*  CO2  --   < > 28  < > 28  GLUCOSE  --   < > 233*  < > 205*  BUN  --   < > 67*  < > 77*  CREATININE  --   < > 0.97  < > 1.03*  CALCIUM  --   < > 8.3*  < > 8.2*  MG 2.0  --   --   --   --   AST  --   --  35  --   --   ALT  --   --  49  --   --   ALKPHOS  --   --  43  --   --   BILITOT  --   --  0.8  --   --   < > = values in this interval not displayed. ------------------------------------------------------------------------------------------------------------------  Cardiac Enzymes No results for input(s): TROPONINI in the last 168 hours. ------------------------------------------------------------------------------------------------------------------  RADIOLOGY:  Dg Chest 1 View  05/29/2015  CLINICAL DATA:  Dyspnea, respiratory failure,  COPD, lung mass EXAM: CHEST 1 VIEW COMPARISON:  Portable chest x-ray of May 28, 2015 FINDINGS: There is persistent near total atelectasis of the right lung with some aeration noted in the lower hemi thorax. The left lung is clear. There is no significant mediastinal shift. The cardiac silhouette is normal in size. The endotracheal tube tip lies 4.6 cm above the carina. The esophagogastric tube tip projects below the inferior margin of the image. The right-sided PICC line tip projects over the midportion of the SVC. IMPRESSION: Persistent consolidation of the mid and upper right lung with small amount of residual aerated right lower lung. The left lung is clear.  The support tubes are in reasonable position. Electronically Signed   By: David  Martinique M.D.   On: 05/29/2015 07:20   Dg Chest 1 View  05/28/2015  CLINICAL DATA:  Hypoxia EXAM: CHEST 1 VIEW COMPARISON:  May 26, 2015 FINDINGS: Endotracheal tube tip is 4.6 cm above the carina. Nasogastric tube tip and side port below the diaphragm. Central catheter tip is in the superior cava. No pneumothorax. There is complete opacification of the right hemithorax with volume loss and probable effusion. There is elevation of the right hemidiaphragm. The left lung is clear. The heart size is within normal limits. Pulmonary vascularity on the left is within normal limits. Pulmonary vascularity on the right is obscured. There is atherosclerotic change throughout the aorta. IMPRESSION: Tube and catheter positions as described without pneumothorax. Complete opacification of the right hemithorax remains with evidence of volume loss and probable pleural effusion. Left lung clear. No change in cardiac silhouette. Electronically Signed   By: Lowella Grip III M.D.   On: 05/28/2015 07:18    EKG:   Orders placed or performed during the hospital encounter of 05/07/2015  . EKG 12-Lead  . EKG 12-Lead    ASSESSMENT AND PLAN:   1. Acute on chronic respiratory failure with hypoxia and Hypercapnia,  - Multifactorial due to large right-sided pleural effusion/ CHF/ COPD / mass- SCLC - Pulmonology and oncology is following, failed multiple extubation attempts.  - Continue nebulizer, steroids, antibiotics. Oncology initiated chemotherapy October 31st 2016 with cisplatin and etoposide with hopes to shrink the mass. Timing of shrinkage  is unclear, but air entrance into right lung has improved today, although chest x-ray does not show any improvement. Appreciate palliative care input , ENT saw patient and discussed this oncologist , no tracheostomy tube is recommended at this time but revisit issue in about 1 week   2. 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 Vital signs are good  3. Acute on chronic kidney disease:  - Due to ATN, hypovolemia and hypotension, kidney function is normal now - Nephrology signed off. Patient was restarted on losartan. Following kidney function closely  4. Essential hypertension:  - , continue metoprolol and losartan, norvasc, better blood pressure control  5. Diabetes mellitus type 2: Hold home medications Provide sliding scale insulin, blood glucose is ranging around 162- 200, now on NovoLog RTC to cover tube feeds,  advance  6.  Small cell lung cancer, likely stage IV per oncology - Status post bronchoscopy 10/25, pathology show small cell lung cancer - Long-term history of heavy smoking - pathology reports were discussed with the patient and sister in the past.   - Oncology initiated chemotherapy ,  PICC line for chemotherapy is  placed 05/26/2015- Radiation oncology has been consulted and patient may benefit from radiology oncology intervention as well, since she is intubated. It might be difficult to perform. Following patient's platelet count closely with chemotherapy  7. Postobstructive pneumonia versus atelectasis - Blood cultures negative to date, respiratory, BAL, AFB normal flora - Discontinue vancomycin and Zosyn, completed 7 day Levaquin course  8. Arrhythmia, SVT versus paroxysmal atrial fibrillation during bronchoscopy, now in sinus rhythm, continue metoprolol per cardiology's recommendations  9 thrombocytopenia, following closely, may need transfusions with chemotherapy for lung ca, It is holding the decision of doing trach.  10. Hypernatremia, patient is receiving advanced water flushes with tube feeds, but her sodium level is not improving. Apparently she has good urinary output, getting osmolarities  of urine and serum to rule out diabetes insipidus, but poor diabetes control could also cause significant hypernatremia, advancing insulin NovoLog every 4 hours dose, may need to add Lantus .   11. UTI   Rocephin IV  12. Loose stool, distended abdomen    Check for c diff.  All the records are reviewed and case discussed with the patient's husband and sister at the bedside.  CODE STATUS: Full code  TOTAL CRITICAL CARE TIME TAKING CARE OF THIS PATIENT: 45 minutes.    Vaughan Basta M.D on 05/29/2015 at 6:38 PM  Between 7am to 6pm - Pager - [phone] After 6pm go to [email or web address] - password [email or web address] [facility]  Office  [phone]  CC: Primary care physician; Dagoberto Ligas, MD

## 2015-05-29 NOTE — Care Management Note (Signed)
Case Management Note  Patient Details  Name: Emily Pratt MRN: [id number] Date of Birth: October 25, 1945  Subjective/Objective: Remains vented with failed weanings. Precedex gtt. Patient has finished chemo at this time but it may be restarted in 2-3 weeks. Not LTACH appropriate. Not stable for SNF at this time. CSW updated.                   Action/Plan: Following progression.   Expected Discharge Date:                  Expected Discharge Plan:     In-House Referral:     Discharge planning Services     Post Acute Care Choice:    Choice offered to:     DME Arranged:    DME Agency:     HH Arranged:    HH Agency:     Status of Service:  In process, will continue to follow  Medicare Important Message Given:    Date Medicare IM Given:    Medicare IM give by:    Date Additional Medicare IM Given:    Additional Medicare Important Message give by:     If discussed at [place] of Stay Meetings, dates discussed:    Additional Comments:  Jolly Mango, RN 05/29/2015, 2:48 PM

## 2015-05-30 ENCOUNTER — Inpatient Hospital Stay: Payer: Medicare Other

## 2015-05-30 DIAGNOSIS — C3492 Malignant neoplasm of unspecified part of left bronchus or lung: Secondary | ICD-10-CM

## 2015-05-30 DIAGNOSIS — J9622 Acute and chronic respiratory failure with hypercapnia: Secondary | ICD-10-CM

## 2015-05-30 DIAGNOSIS — J9690 Respiratory failure, unspecified, unspecified whether with hypoxia or hypercapnia: Secondary | ICD-10-CM

## 2015-05-30 LAB — GLUCOSE, CAPILLARY
Glucose-Capillary: 109 mg/dL — ABNORMAL HIGH (ref 65–99)
Glucose-Capillary: 68 mg/dL (ref 65–99)
Glucose-Capillary: 71 mg/dL (ref 65–99)
Glucose-Capillary: 83 mg/dL (ref 65–99)
Glucose-Capillary: 89 mg/dL (ref 65–99)
Glucose-Capillary: 96 mg/dL (ref 65–99)
Glucose-Capillary: 97 mg/dL (ref 65–99)

## 2015-05-30 LAB — BASIC METABOLIC PANEL
Anion gap: 3 — ABNORMAL LOW (ref 5–15)
BUN: 77 mg/dL — ABNORMAL HIGH (ref 6–20)
CALCIUM: 8.1 mg/dL — AB (ref 8.9–10.3)
CO2: 31 mmol/L (ref 22–32)
CREATININE: 0.88 mg/dL (ref 0.44–1.00)
Chloride: 118 mmol/L — ABNORMAL HIGH (ref 101–111)
GFR calc non Af Amer: >60 mL/min (ref >60)
Glucose, Bld: 95 mg/dL (ref 65–99)
Potassium: 4 mmol/L (ref 3.5–5.1)
SODIUM: 152 mmol/L — AB (ref 135–145)

## 2015-05-30 LAB — CBC
HCT: 37.9 % (ref 35.0–47.0)
Hemoglobin: 11.9 g/dL — ABNORMAL LOW (ref 12.0–16.0)
MCH: 29.4 pg (ref 26.0–34.0)
MCHC: 31.5 g/dL — AB (ref 32.0–36.0)
MCV: 93.5 fL (ref 80.0–100.0)
PLATELETS: 64 10*3/uL — AB (ref 150–440)
RBC: 4.06 MIL/uL (ref 3.80–5.20)
RDW: 18.3 % — AB (ref 11.5–14.5)
WBC: 6.5 10*3/uL (ref 3.6–11.0)

## 2015-05-30 MED ORDER — DEXTROSE 5 % IV SOLN
1.0000 g | INTRAVENOUS | Status: DC
  Administered 2015-05-30 – 2015-05-31 (×2): 1 g via INTRAVENOUS
  Filled 2015-05-30 (×3): qty 10

## 2015-05-30 MED ORDER — ALLOPURINOL 100 MG PO TABS
100.0000 mg | ORAL_TABLET | Freq: Every day | ORAL | Status: DC
  Administered 2015-05-30 – 2015-06-07 (×9): 100 mg via ORAL
  Filled 2015-05-30 (×9): qty 1

## 2015-05-30 NOTE — Progress Notes (Addendum)
Pharmacy Consult for Electrolyte Monitoring   Allergies  Allergen Reactions  . Atorvastatin Other (See Comments)    Does not remember why she had Intolerance to Lipitor.  . Fentanyl Itching  . Rosuvastatin Anxiety    Chest tigtness and feeling as if she had heartburn.    Patient Measurements: Height: '5\' 7"'$  (170.2 cm) Weight: 260 lb 5.8 oz (118.1 kg) IBW/kg (Calculated) : 61.6   Vital Signs: Temp: 98.1 F (36.7 C) (11/04 0125) Temp Source: Axillary (11/04 0125) BP: 124/65 mmHg (11/04 0600) Pulse Rate: 74 (11/04 0600) Intake/Output from previous day: 11/03 0701 - 11/04 0700 In: 2442.8 [I.V.:972.8; NG/GT:1410] Out: 1951 [Urine:1950; Stool:1] Intake/Output from this shift:    Labs:  Recent Labs  05/28/15 0452 05/29/15 0441 05/30/15 0415  WBC 5.1 6.6 6.5  HGB 12.4 12.7 11.9*  HCT 38.7 39.6 37.9  PLT 62* 59* 64*     Recent Labs  05/27/15 1457 05/28/15 0452 05/28/15 1442 05/29/15 0441 05/30/15 0415  NA 151* 153* 150* 151* 152*  K 3.9 3.9  --  4.3 4.0  CL 116* 119*  --  119* 118*  CO2 28 28  --  28 31  GLUCOSE 233* 207*  --  205* 95  BUN 67* 71*  --  77* 77*  CREATININE 0.97 1.10*  --  1.03* 0.88  CALCIUM 8.3* 8.4*  --  8.2* 8.1*  PROT 5.3*  --   --   --   --   ALBUMIN 2.6*  --   --   --   --   AST 35  --   --   --   --   ALT 49  --   --   --   --   ALKPHOS 43  --   --   --   --   BILITOT 0.8  --   --   --   --    Estimated Creatinine Clearance: 82.5 mL/min (by C-G formula based on Cr of 0.88).    Recent Labs  05/29/15 1951 05/30/15 0001 05/30/15 0358  GLUCAP 119* 89 83    Medical History: Past Medical History  Diagnosis Date  . Anxiety   . Arthritis   . COPD (chronic obstructive pulmonary disease) ([place])   . CHF (congestive heart failure) ([place])   . Hyperlipidemia   . Hypertension   . Diabetes mellitus without complication ([place])   . Chronic kidney disease   . Neuromuscular disorder ([facility])     Medications:  Scheduled:  . amLODipine  5  mg Oral Daily  . antiseptic oral rinse  7 mL Mouth Rinse QID  . budesonide (PULMICORT) nebulizer solution  0.5 mg Nebulization BID  . chlorhexidine gluconate  15 mL Mouth Rinse BID  . colchicine  0.6 mg Per Tube Daily  . famotidine  20 mg Per Tube BID  . free water  250 mL Per Tube Q4H  . heparin subcutaneous  5,000 Units Subcutaneous Q12H  . insulin aspart  0-15 Units Subcutaneous 6 times per day  . insulin aspart  5 Units Subcutaneous Q4H  . insulin detemir  15 Units Subcutaneous Q24H  . ipratropium-albuterol  3 mL Nebulization Q6H  . losartan  100 mg Oral Daily  . metoprolol  100 mg Per Tube BID  . predniSONE  10 mg Oral Q breakfast  . pregabalin  50 mg Per Tube TID   Infusions:  . sodium chloride 75 mL/hr at 05/29/15 1255  . dexmedetomidine 0.6 mcg/kg/hr (05/30/15 0536)  .  feeding supplement (VITAL HIGH PROTEIN) 1,000 mL (05/30/15 0427)    Assessment: Patient started on 1/2NS @ 69m on 11/2. Patient's sodium remains elevated but stable.   Plan:  No additional supplementation warranted at this time.  Will f/u AM labs.   Pharmacy will continue to monitor labs and replace electrolytes as needed.   SUlice Dash PharmD

## 2015-05-30 NOTE — Progress Notes (Signed)
[facility] NAME: Emily Pratt    MR#:  [id number]  DATE OF BIRTH:  10/04/1945  SUBJECTIVE:  CHIEF COMPLAINT:  Intubated , comfortable, patient would like to have TT placed in case it is needed, but Dr. Tami Ribas discussed case with Dr. Grayland Ormond and because of thrombocytopenia, no tracheostomy tube placement was recommended at this time with revisit issue in about a week. Patient feels comfortable, failed multiple extubation attempts. Pt is not on sedation, abdomen looks more distended, but no discomfort- have rectal tube.  REVIEW OF SYSTEMS:   Unable to obtain  DRUG ALLERGIES:   Allergies  Allergen Reactions  . Atorvastatin Other (See Comments)    Does not remember why she had Intolerance to Lipitor.  . Fentanyl Itching  . Rosuvastatin Anxiety    Chest tigtness and feeling as if she had heartburn.    VITALS:  Blood pressure 123/64, pulse 82, temperature 98.5 F (36.9 C), temperature source Oral, resp. rate 17, height '5\' 7"'$  (1.702 m), weight 118.1 kg (260 lb 5.8 oz), SpO2 90 %.  PHYSICAL EXAMINATION:  GENERAL:  69 y.o.-year-old patient lying in the bed, intubated, somnolent, easily awakened. Opens eyes , communicates, comfortable EYES: Pupils equal, round, reactive to light and accommodation. No scleral icterus.  HEENT: Head atraumatic, normocephalic. Oropharynx and nasopharynx clear. ETT in place NECK:  Supple, no jugular venous distention. No thyroid enlargement, no tenderness.  LUNGS: Better air entrance on the right, although still diminished breath sounds on the right , intermittent wheezing and rhonchi , good air movements on the left , on ventilator support. CARDIOVASCULAR: S1, S2 normal. No murmurs, rubs, or gallops.  ABDOMEN: Soft, nontender, distended. Bowel sounds slugish. No organomegaly or mass.  EXTREMITIES: +1 pedal edema, cyanosis, or clubbing.  NEUROLOGIC: CN 2-12 intact, strength 5/5, somnolent but does  follow commands PSYCHIATRIC: Somnolent SKIN: Sacral decubitus ulcer   LABORATORY PANEL:   CBC  Recent Labs Lab 05/30/15 0415  WBC 6.5  HGB 11.9*  HCT 37.9  PLT 64*   ------------------------------------------------------------------------------------------------------------------  Chemistries   Recent Labs Lab 05/27/15 0450  05/27/15 1457  05/30/15 0415  NA  --   < > 151*  < > 152*  K  --   < > 3.9  < > 4.0  CL  --   < > 116*  < > 118*  CO2  --   < > 28  < > 31  GLUCOSE  --   < > 233*  < > 95  BUN  --   < > 67*  < > 77*  CREATININE  --   < > 0.97  < > 0.88  CALCIUM  --   < > 8.3*  < > 8.1*  MG 2.0  --   --   --   --   AST  --   --  35  --   --   ALT  --   --  49  --   --   ALKPHOS  --   --  43  --   --   BILITOT  --   --  0.8  --   --   < > = values in this interval not displayed. ------------------------------------------------------------------------------------------------------------------  Cardiac Enzymes No results for input(s): TROPONINI in the last 168 hours. ------------------------------------------------------------------------------------------------------------------  RADIOLOGY:  Dg Chest 1 View  05/30/2015  CLINICAL DATA:  Shortness of breath. EXAM: CHEST 1 VIEW COMPARISON:  05/29/2015.  CT  05/20/2015 . FINDINGS: Endotracheal tube, NG tube, right PICC line in stable position. Persistent consolidation of the right lung and right pleural effusion again noted . Heart size stable. No pneumothorax. No acute osseous abnormality. IMPRESSION: 1. Lines and tubes in stable position. 2. Persistent consolidation of the right lung with prominent right pleural effusion. No interim change from prior exam. Electronically Signed   By: Marcello Moores  Register   On: 05/30/2015 07:15   Dg Chest 1 View  05/29/2015  CLINICAL DATA:  Dyspnea, respiratory failure, COPD, lung mass EXAM: CHEST 1 VIEW COMPARISON:  Portable chest x-ray of May 28, 2015 FINDINGS: There is persistent  near total atelectasis of the right lung with some aeration noted in the lower hemi thorax. The left lung is clear. There is no significant mediastinal shift. The cardiac silhouette is normal in size. The endotracheal tube tip lies 4.6 cm above the carina. The esophagogastric tube tip projects below the inferior margin of the image. The right-sided PICC line tip projects over the midportion of the SVC. IMPRESSION: Persistent consolidation of the mid and upper right lung with small amount of residual aerated right lower lung. The left lung is clear. The support tubes are in reasonable position. Electronically Signed   By: David  Martinique M.D.   On: 05/29/2015 07:20   Dg Abd 1 View  05/30/2015  CLINICAL DATA:  Lung cancer.  Distended abdomen. EXAM: ABDOMEN - 1 VIEW COMPARISON:  05/20/2015 FINDINGS: Mild gaseous distention of the stomach and transverse colon. No small bowel dilatation to suggest obstruction. No organomegaly. No free air. IMPRESSION: Mild gaseous distention of the stomach and transverse colon. This may reflect mild ileus. Electronically Signed   By: Rolm Baptise M.D.   On: 05/30/2015 16:42    EKG:   Orders placed or performed during the hospital encounter of 05/15/2015  . EKG 12-Lead  . EKG 12-Lead    ASSESSMENT AND PLAN:   1. Acute on chronic respiratory failure with hypoxia and Hypercapnia,  - Multifactorial due to large right-sided pleural effusion/ CHF/ COPD / mass- SCLC - Pulmonology and oncology is following, failed multiple extubation attempts.  - Continue nebulizer, steroids, antibiotics. Oncology initiated chemotherapy October 31st 2016 with cisplatin and etoposide with hopes to shrink the mass. Timing of shrinkage  is unclear,  chest x-ray does not show any improvement. Appreciate palliative care input , ENT saw patient and discussed this oncologist , no tracheostomy tube is recommended at this time but revisit issue in about 1 week   2. Acute Congestive heart failure: Mixed  systolic and diastolic  - Echo 02/72 shows ejection fraction 30 -35% with abnormal filling - Continue metoprolol per cardiology's recommendations , resumed losartan . Vital signs are good  3. Acute on chronic kidney disease:  - Due to ATN, hypovolemia and hypotension, kidney function is normal now - Nephrology signed off. Patient was restarted on losartan. Following kidney function closely  4. Essential hypertension:  - , continue metoprolol and losartan, norvasc, better blood pressure control  5. Diabetes mellitus type 2: Hold home medications Provide sliding scale insulin, blood glucose is ranging around 162- 200, now on NovoLog RTC to cover tube feeds,  Advance Now holding tube feed due to mild ileus.  6.  Small cell lung cancer, likely stage IV per oncology - Status post bronchoscopy 10/25, pathology show small cell lung cancer - Long-term history of heavy smoking - pathology reports were discussed with the patient and sister in the past.   - Oncology  initiated chemotherapy ,  PICC line for chemotherapy is placed 05/26/2015- Radiation oncology has been consulted and patient may benefit from radiology oncology intervention as well, since she is intubated. It might be difficult to perform. Following patient's platelet count closely with chemotherapy  7. Postobstructive pneumonia versus atelectasis - Blood cultures negative to date, respiratory, BAL, AFB normal flora - Discontinue vancomycin and Zosyn, completed 7 day Levaquin course  8. Arrhythmia, SVT versus paroxysmal atrial fibrillation during bronchoscopy, now in sinus rhythm, continue metoprolol per cardiology's recommendations  9 thrombocytopenia, following closely, may need transfusions with chemotherapy for lung ca, It is holding the decision of doing trach.  10. Hypernatremia, patient is receiving advanced water flushes with tube feeds, but her sodium level is not improving. Apparently she has good urinary output, getting  osmolarities of urine and serum to rule out diabetes insipidus, but poor diabetes control could also cause significant hypernatremia, advancing insulin NovoLog every 4 hours dose, may need to add Lantus .   11. UTI   Rocephin IV- check Ur cx- 05/29/15.  12. Ileus   Hold feeding and called surgical consult- repeat xray.  12. Loose stool, distended abdomen    Check for c diff.  All the records are reviewed and case discussed with the patient's husband and sister at the bedside.  CODE STATUS: Full code  TOTAL CRITICAL CARE TIME TAKING CARE OF THIS PATIENT: 35 minutes.    Vaughan Basta M.D on 05/30/2015 at 5:12 PM  Between 7am to 6pm - Pager - [phone] After 6pm go to [email or web address] - password [email or web address] [facility]  Office  [phone]  CC: Primary care physician; Dagoberto Ligas, MD

## 2015-05-30 NOTE — Progress Notes (Signed)
[place]  [phone] [phone]  ID: SHARESA KEMP OB: 23-Nov-1945  MR#: [id number]  YPP#:[id number]  Patient Care Team: Dagoberto Ligas, MD as PCP - General (Internal Medicine)  CHIEF COMPLAINT:  Chief Complaint  Patient presents with  . Respiratory Distress    INTERVAL HISTORY: Patient alert, answering questions to her best ability. Reports family has gone home. She remained intubated, but offers no new complaints. She has completed cycle 1 of Carboplatinum and Etoposide.   REVIEW OF SYSTEMS:   Review of Systems  Unable to perform ROS: intubated    PAST MEDICAL HISTORY: Past Medical History  Diagnosis Date  . Anxiety   . Arthritis   . COPD (chronic obstructive pulmonary disease) ([place])   . CHF (congestive heart failure) ([place])   . Hyperlipidemia   . Hypertension   . Diabetes mellitus without complication ([place])   . Chronic kidney disease   . Neuromuscular disorder ([place])     PAST SURGICAL HISTORY: Past Surgical History  Procedure Laterality Date  . Joint replacement Bilateral 2006    both knees  . Ankle surgery      FAMILY HISTORY Family History  Problem Relation Age of Onset  . Diabetes Mother   . Cancer Father   . Diabetes Sister   . Stroke Sister        ADVANCED DIRECTIVES:    HEALTH MAINTENANCE: Social History  Substance Use Topics  . Smoking status: Current Every Day Smoker    Types: Cigarettes  . Smokeless tobacco: None     Comment: 0.5 pack /day  . Alcohol Use: No     Colonoscopy:  PAP:  Bone density:  Lipid panel:  Allergies  Allergen Reactions  . Atorvastatin Other (See Comments)    Does not remember why she had Intolerance to Lipitor.  . Fentanyl Itching  . Rosuvastatin Anxiety    Chest tigtness and feeling as if she had heartburn.    Current Facility-Administered Medications  Medication Dose Route Frequency Provider Last Rate Last Dose  . 0.45 % sodium chloride infusion   Intravenous  Continuous Laverle Hobby, MD 75 mL/hr at 05/29/15 1255    . acetaminophen (TYLENOL) tablet 650 mg  650 mg Oral Q6H PRN Harrie Foreman, MD   650 mg at 05/29/15 4580   Or  . acetaminophen (TYLENOL) suppository 650 mg  650 mg Rectal Q6H PRN Harrie Foreman, MD      . allopurinol (ZYLOPRIM) tablet 100 mg  100 mg Oral Daily Laverle Hobby, MD   100 mg at 05/30/15 1151  . amLODipine (NORVASC) tablet 5 mg  5 mg Oral Daily Theodoro Grist, MD   5 mg at 05/30/15 1004  . antiseptic oral rinse solution (CORINZ)  7 mL Mouth Rinse QID Vishal Mungal, MD   7 mL at 05/30/15 1152  . budesonide (PULMICORT) nebulizer solution 0.5 mg  0.5 mg Nebulization BID Flora Lipps, MD   0.5 mg at 05/30/15 0806  . cefTRIAXone (ROCEPHIN) 1 g in dextrose 5 % 50 mL IVPB  1 g Intravenous Q24H Vaughan Basta, MD   1 g at 05/30/15 0954  . chlorhexidine gluconate (PERIDEX) 0.12 % solution 15 mL  15 mL Mouth Rinse BID Vishal Mungal, MD   15 mL at 05/30/15 0824  . dexmedetomidine (PRECEDEX) 400 MCG/100ML (4 mcg/mL) infusion  0-1.2 mcg/kg/hr Intravenous Continuous Laverle Hobby, MD 15.8 mL/hr at 05/30/15 1204 0.6 mcg/kg/hr at 05/30/15 1204  . famotidine (PEPCID) tablet 20 mg  20 mg  Per Tube BID Theodoro Grist, MD   20 mg at 05/30/15 1004  . feeding supplement (VITAL HIGH PROTEIN) liquid 1,000 mL  1,000 mL Per Tube Continuous Laverle Hobby, MD 60 mL/hr at 05/30/15 0427 1,000 mL at 05/30/15 0427  . fentaNYL (SUBLIMAZE) injection 25-100 mcg  25-100 mcg Intravenous Q2H PRN Wilhelmina Mcardle, MD   100 mcg at 05/30/15 1350  . free water 250 mL  250 mL Per Tube Q4H Laverle Hobby, MD   250 mL at 05/30/15 1151  . heparin injection 5,000 Units  5,000 Units Subcutaneous Q12H Laverle Hobby, MD   5,000 Units at 05/30/15 1001  . insulin aspart (novoLOG) injection 0-15 Units  0-15 Units Subcutaneous 6 times per day Wilhelmina Mcardle, MD   3 Units at 05/29/15 1633  . insulin detemir (LEVEMIR) injection 15 Units   15 Units Subcutaneous Q24H Laverle Hobby, MD   15 Units at 05/30/15 1149  . ipratropium-albuterol (DUONEB) 0.5-2.5 (3) MG/3ML nebulizer solution 3 mL  3 mL Nebulization Q6H Vishal Mungal, MD   3 mL at 05/30/15 1443  . losartan (COZAAR) tablet 100 mg  100 mg Oral Daily Theodoro Grist, MD   100 mg at 05/30/15 1005  . metoprolol (LOPRESSOR) tablet 100 mg  100 mg Per Tube BID Wilhelmina Mcardle, MD   100 mg at 05/30/15 1004  . midazolam (VERSED) injection 1-2 mg  1-2 mg Intravenous Q2H PRN Wilhelmina Mcardle, MD   2 mg at 05/29/15 2131  . ondansetron (ZOFRAN) tablet 4 mg  4 mg Oral Q6H PRN Harrie Foreman, MD       Or  . ondansetron [facility]) injection 4 mg  4 mg Intravenous Q6H PRN Harrie Foreman, MD      . pregabalin (LYRICA) capsule 50 mg  50 mg Per Tube TID Wilhelmina Mcardle, MD   50 mg at 05/30/15 1003    OBJECTIVE: Filed Vitals:   05/30/15 1400  BP: 142/70  Pulse: 78  Temp:   Resp: 22     Body mass index is 40.77 kg/(m^2).    ECOG FS:4 - Bedbound  General:  intubated, but alert, answers questions by nodding or writing. Eyes: Pink conjunctiva, anicteric sclera. HEENT:  ET tube in place. Lungs: Clear to auscultation bilaterally. Heart: Regular rate and rhythm. No rubs, murmurs, or gallops. Abdomen: Soft, nontender, nondistended. No organomegaly noted, normoactive bowel sounds. Musculoskeletal: No edema, cyanosis, or clubbing. Neuro: Lethargic.  Skin: No rashes or petechiae noted.   LAB RESULTS:  Lab Results  Component Value Date   NA 152* 05/30/2015   K 4.0 05/30/2015   CL 118* 05/30/2015   CO2 31 05/30/2015   GLUCOSE 95 05/30/2015   BUN 77* 05/30/2015   CREATININE 0.88 05/30/2015   CALCIUM 8.1* 05/30/2015   PROT 5.3* 05/27/2015   ALBUMIN 2.6* 05/27/2015   AST 35 05/27/2015   ALT 49 05/27/2015   ALKPHOS 43 05/27/2015   BILITOT 0.8 05/27/2015   GFRNONAA >60 05/30/2015   GFRAA >60 05/30/2015    Lab Results  Component Value Date   WBC 6.5 05/30/2015   NEUTROABS  4.1 04/24/2015   HGB 11.9* 05/30/2015   HCT 37.9 05/30/2015   MCV 93.5 05/30/2015   PLT 64* 05/30/2015     STUDIES: Dg Chest 1 View  05/30/2015  CLINICAL DATA:  Shortness of breath. EXAM: CHEST 1 VIEW COMPARISON:  05/29/2015.  CT 05/20/2015 . FINDINGS: Endotracheal tube, NG tube, right PICC line in stable position. Persistent consolidation  of the right lung and right pleural effusion again noted . Heart size stable. No pneumothorax. No acute osseous abnormality. IMPRESSION: 1. Lines and tubes in stable position. 2. Persistent consolidation of the right lung with prominent right pleural effusion. No interim change from prior exam. Electronically Signed   By: Marcello Moores  Register   On: 05/30/2015 07:15   Dg Chest 1 View  05/29/2015  CLINICAL DATA:  Dyspnea, respiratory failure, COPD, lung mass EXAM: CHEST 1 VIEW COMPARISON:  Portable chest x-ray of May 28, 2015 FINDINGS: There is persistent near total atelectasis of the right lung with some aeration noted in the lower hemi thorax. The left lung is clear. There is no significant mediastinal shift. The cardiac silhouette is normal in size. The endotracheal tube tip lies 4.6 cm above the carina. The esophagogastric tube tip projects below the inferior margin of the image. The right-sided PICC line tip projects over the midportion of the SVC. IMPRESSION: Persistent consolidation of the mid and upper right lung with small amount of residual aerated right lower lung. The left lung is clear. The support tubes are in reasonable position. Electronically Signed   By: David  [place] M.D.   On: 05/29/2015 07:20   Dg Chest 1 View  05/28/2015  CLINICAL DATA:  Hypoxia EXAM: CHEST 1 VIEW COMPARISON:  May 26, 2015 FINDINGS: Endotracheal tube tip is 4.6 cm above the carina. Nasogastric tube tip and side port below the diaphragm. Central catheter tip is in the superior cava. No pneumothorax. There is complete opacification of the right hemithorax with volume loss and  probable effusion. There is elevation of the right hemidiaphragm. The left lung is clear. The heart size is within normal limits. Pulmonary vascularity on the left is within normal limits. Pulmonary vascularity on the right is obscured. There is atherosclerotic change throughout the aorta. IMPRESSION: Tube and catheter positions as described without pneumothorax. Complete opacification of the right hemithorax remains with evidence of volume loss and probable pleural effusion. Left lung clear. No change in cardiac silhouette. Electronically Signed   By: Lowella Grip III M.D.   On: 05/28/2015 07:18   Dg Chest 1 View  05/26/2015  CLINICAL DATA:  Wheezing and shortness of breath EXAM: CHEST 1 VIEW COMPARISON:  Portable chest x-ray of today's date FINDINGS: There is persistent 0 opacification of the mid and upper thirds of the right hemi thorax. Only a small amount of aerated lung is visible in the lower hemi thorax. Pleural fluid on the right is suspected. The cardiac silhouette is mildly enlarged. The left lung is well-expanded. There is no focal infiltrate or pleural effusion on the left. The endotracheal tube tip projects 4.2 cm above the carina. The PICC line tip on the left overlies the junction of the proximal and midportions of the SVC. IMPRESSION: Fairly stable appearance of the chest since the previous study with opacification of the upper 2/3 of the right hemithorax consistent with atelectasis-pneumonia and left pleural effusion. The left lung remains clear. Electronically Signed   By: David  [place] M.D.   On: 05/26/2015 13:51   Ct Chest Wo Contrast  05/20/2015  CLINICAL DATA:  69 year old who presented with acute hypercapnic respiratory failure, current history of COPD and CHF, stage 3 chronic kidney disease, with an enlarging right pleural effusion and right paratracheal soft tissue on chest x-ray yesterday. EXAM: CT CHEST WITHOUT CONTRAST TECHNIQUE: Multidetector CT imaging of the chest was  performed following the standard protocol without IV contrast. Intravenous contrast was not administered  due to the patient's chronic kidney disease. COMPARISON:  No prior CT.  Chest x-rays 05/22/2015 and earlier. FINDINGS: Best seen on coronal reformatted images is abrupt occlusion of the right upper lobe bronchus and the right bronchus intermedius centrally. As a result, there is dense consolidation in the entire right lung with a areas of hypoattenuation giving a "drowned lung" appearance. Without intravenous contrast, it is difficult to visualize the large obstructing mass as it has similar attenuation to the consolidated lung. There is an associated small to moderate sized right pleural effusion. No pulmonary parenchymal nodules or masses involving the left lung. Linear scarring involving the medial left lower lobe. No confluent airspace consolidation in the left lung. No left pleural effusion. Apparent pleural thickening throughout the left hemithorax is related to abundant subpleural fat. Bulky right paratracheal conglomerate lymphadenopathy in station 2R and station 4R measuring approximately 5.6 x 5.0 x 7.4 cm. Enlarged lymph nodes elsewhere in the right side of the mediastinum. Enlarged right retroclavicular lymph nodes, the largest measuring approximately 2.8 x 3.8 x 3.1 cm. The bulky mediastinal lymphadenopathy compresses the superior vena cava, and there is a prominent right internal mammary vein collateral. Heart size upper normal. No pericardial effusion. Mild LAD and right coronary artery atherosclerosis. Moderate atherosclerosis involving the thoracic and upper abdominal aorta and their visualized branches. Approximate 3 cm cyst involving the posterior segment right lobe of liver. Within the limits of the unenhanced technique, no solid mass involving visualized liver. Approximate 1.6 x 2.6 x 2.2 cm low-attenuation mass involving the left adrenal gland. Solitary punctate calcification involving the  proximal body of the pancreas. Visualized upper abdomen otherwise unremarkable for the unenhanced technique. IMPRESSION: 1. Obstructing central mass involving the right lung with occlusion of the right upper lobe bronchus and the bronchus intermedius with complete atelectasis of the right lung. The mass is difficult to visualize as it is of similar attenuation to the atelectatic lung. Bronchoscopy is recommended in further evaluation and for diagnosis. 2. Small to moderate-sized right pleural effusion. 3. Metastatic mediastinal lymphadenopathy as detailed above, the largest conglomerate nodal mass in the right paratracheal region. 4. Mild compression of the superior vena cava with a prominent right internal mammary vein collateral. 5. Approximate 3 cm cyst involving the posterior segment right lobe of the visualized liver. 6. Approximate 2.6 cm low-attenuation mass involving the left adrenal gland, likely adenoma. Electronically Signed   By: Evangeline Dakin M.D.   On: 05/20/2015 12:41   US Renal  05/21/2015  CLINICAL DATA:  Acute renal failure EXAM: RENAL / URINARY TRACT ULTRASOUND COMPLETE COMPARISON:  None. FINDINGS: Right Kidney: Length: 11 cm. Echogenicity within normal limits. No mass or hydronephrosis visualized. There is cortical thinning probable due to atrophy Left Kidney: Length: 12.2 cm. Cortical thinning is noted probable due to atrophy. Echogenicity within normal limits. No mass or hydronephrosis visualized. Bladder: The urinary bladder is decompressed with Foley catheter. IMPRESSION: 1. No hydronephrosis. No renal calculi. Bilateral cortical thinning probable due to atrophy. Decompressed urinary bladder with Foley catheter. Electronically Signed   By: Lahoma Crocker M.D.   On: 05/21/2015 09:25   Dg Chest Port 1 View  05/26/2015  CLINICAL DATA:  Respiratory failure. EXAM: PORTABLE CHEST 1 VIEW COMPARISON:  05/25/2015 .  CT 05/20/2015. FINDINGS: Endotracheal tube and NG tube noted in stable  position. Interim increase consolidation in the right upper lobe. Persistent right pleural effusion. Left lung is clear. Stable cardiomegaly. No pneumothorax. No acute osseus abnormality P IMPRESSION: Interim increase in  consolidation of the right upper lobe with persistent right pleural effusion. Persistent occlusion of the right mainstem bronchus appears to be present. Electronically Signed   By: [place]   On: 05/26/2015 07:13   Dg Chest Port 1 View  05/25/2015  CLINICAL DATA:  Respiratory failure. EXAM: PORTABLE CHEST 1 VIEW COMPARISON:  05/21/2015 FINDINGS: The ET tube tip is above the carina. The nasogastric tube tip is below the GE junction. Normal heart size. Aortic atherosclerosis. Partially loculated right pleural effusion is again identified and appears unchanged from the previous exam. Left lung is clear. IMPRESSION: 1. Loculated right pleural effusion is stable. 2. Left lung is clear. Electronically Signed   By: Kerby Moors M.D.   On: 05/25/2015 09:03   Dg Chest Port 1 View  05/21/2015  CLINICAL DATA:  Respiratory failure, intubated EXAM: PORTABLE CHEST 1 VIEW COMPARISON:  05/20/2015 FINDINGS: Cardiomegaly again noted. Stable endotracheal and NG tube position. Again noted almost complete opacification of the right hemi thorax. Chronic elevation of the right hemidiaphragm. Mild left basilar atelectasis. No convincing pulmonary edema. No pneumothorax. Atherosclerotic calcifications of thoracic aorta again noted. IMPRESSION: Stable endotracheal and NG tube position. Again noted almost complete opacification of the right hemi thorax. Chronic elevation of the right hemidiaphragm. Mild left basilar atelectasis. No convincing pulmonary edema. No pneumothorax. Atherosclerotic calcifications of thoracic aorta again noted. Electronically Signed   By: Lahoma Crocker M.D.   On: 05/21/2015 08:22   Dg Chest Port 1 View  05/20/2015  CLINICAL DATA:  Acute respiratory failure with hypoxia. Status post  bronchoscopy and line placement. EXAM: PORTABLE CHEST 1 VIEW COMPARISON:  05/20/2015 at 1302 hours FINDINGS: Following bronchoscopy, there is now partial aeration of the right lung noted in the right upper to mid lung. There is also some aeration now noted in the right lower lung with a band of opacity noted across the mid lung bordering the minor fissure. Left lung is hyperexpanded but essentially clear. No pneumothorax. Endotracheal tube is stable with its tip 4.7 cm above the carina. Orogastric tube passes below the diaphragm. IMPRESSION: 1. Improved right lung aeration following bronchoscopy. No pneumothorax. 2. No other change. Electronically Signed   By: Lajean Manes M.D.   On: 05/20/2015 17:42   Dg Chest Port 1 View  05/20/2015  CLINICAL DATA:  Hypoxia EXAM: PORTABLE CHEST 1 VIEW COMPARISON:  Chest CT obtained earlier in the day; chest radiograph May 19, 2015 FINDINGS: Endotracheal tube tip is 4.6 cm above the carina. Nasogastric tube tip and side port are below the diaphragm. No pneumothorax. There is now complete opacification of the right hemi thorax, felt to be due to a combination of effusion and consolidation. Left lung is clear. Heart size is within normal limits. There is atherosclerotic change in the aorta. Mass lesions seen on CT are obscured on the right by the diffuse consolidation and effusion. IMPRESSION: Tube positions as described without pneumothorax. Left lung clear. Complete opacification on the right. Cardiac silhouette within normal limits. Atherosclerotic calcification noted. Electronically Signed   By: Lowella Grip III M.D.   On: 05/20/2015 13:18   Dg Chest Portable 1 View  05/25/2015  CLINICAL DATA:  69 year old female with increasing shortness of breath Coll the today. EXAM: PORTABLE CHEST 1 VIEW COMPARISON:  Chest x-ray 04/24/2015. FINDINGS: Enlarging right pleural effusion. Extensive opacities throughout the right mid to lower lung, which may simply reflect passive  atelectasis, however, underlying airspace consolidation or underlying mass is not excluded. Persistent prominence of right  paratracheal soft tissue highly concerning for lymphadenopathy. Left lung appears relatively clear, although the left base is poorly visualized medially (likely technique related). No evidence of pulmonary edema. Heart size is normal. Atherosclerotic calcifications in the thoracic aorta. IMPRESSION: 1. Overall, there has been significant progression compared to the prior chest x-ray from 04/24/2015, with enlarging now a large right pleural effusion and worsening aeration throughout the right lung. This is unusual in the setting of a treated pneumonia, and given the presence of paratracheal soft tissue thickening on the right which is suspicious for lymphadenopathy, underlying neoplasm is strongly suspected. Further evaluation with contrast enhanced chest CT in is strongly recommended in the near future to evaluate for underlying neoplasm. 2. Atherosclerosis. Electronically Signed   By: Vinnie Langton M.D.   On: 05/17/2015 01:27   Dg Abd Portable 1v  05/20/2015  CLINICAL DATA:  OG an indentation. EXAM: PORTABLE ABDOMEN - 1 VIEW COMPARISON:  None. FINDINGS: Enteric tube tip is adequately positioned in the body of the stomach. Mildly prominent gas-filled loops of small bowel are noted within the right lower quadrant. Overall bowel gas pattern is indeterminate. No evidence of free intraperitoneal air seen. Large dense opacity noted at the right lung base. IMPRESSION: 1. OG tube appears adequately positioned with tip in the region of the stomach body. 2. Large dense opacity at the right lung base, incompletely imaged, which could be consolidation or effusion. Electronically Signed   By: Franki Cabot M.D.   On: 05/20/2015 13:19    ASSESSMENT:  Small cell lung cancer.   PLAN:    1. Small cell lung cancer: Patient is likely stage IV given her suspicious adrenal lesion, but full staging  workup cannot be completed given her acute illness. Patient has had multiple failed attempts at extubation likely secondary to external compression of her mass. Patient has completed cycle 1 of treatment with Carboplatinum and Etoposide. Cycle 2 will occur in approximately 3 weeks. She will ultimately require port placement once she is extubated. XRT would be helpful, but there are patient safety concerns transporting to radiation oncology.   2. Thrombocytopenia: Unclear etiology, but most likely multifactorial secondary to acute illness. Plts today of 64K. Will continue to monitor daily . 3. Tracheostomy: Agree that it is unsafe for surgery at this time given her thrombocytopenia which could decline after receiving chemotherapy this week. Case was previously discussed with ENT and appreciate their input.  4. Mechanical ventilation: Repeat extubation per pulmonology.   Will repeat CT scan chest of next week if patient remains intubated. Will follow.   Evlyn Kanner, NP   05/30/2015 3:30 PM

## 2015-05-30 NOTE — Progress Notes (Signed)
RN made Dr. Ashby Dawes aware that patient had a run of SVT but then went back into NSR. MD acknowledged only and gave no new orders.

## 2015-05-30 NOTE — Progress Notes (Signed)
RN spoke with Dr. Volanda Napoleon and made her aware that patient has mild ileus and that Dr. Anselm Jungling placed order to stop tube feeds and free water flushes.  RN stopped feeds at 1810 and hooked OG to LCS and thus far 750cc has came out and that patient's abdomen is less distended and much more soft.   RN also made MD aware that patient has levimir on board and sodium of 152 with 0.45% saline going at 75cc/H. MD gave order for OG to be hooked to LIS and to perform q2H fingersticks.

## 2015-05-30 NOTE — Progress Notes (Signed)
[facility] Progess Note   ASSESSMENT/PLAN   Patient is a 69 year old female with newly diagnosed right complete lung atelectasis due to small cell lung cancer, which is newly diagnosed on this admission. The patient is vent dependent. Due to the right lung atelectasis. She has completed course of chemotherapy, trach is being considered for next week. If the patient cannot be weaned off the ventilator.   Interim history: No acute events overnight. The patient has no particular complaints at this time. She continues to be on the vent, her breathing is comfortable. She answers questions and appears to be awake and alert. She appears to be frustrated over her current situation and anxious. This morning she appears to have better air entry in the right lung, the patient tolerated spontaneous mode at a PEEP of 8 and pressure support of 12, yesterday, she is been placed back on the ventilator overnight.   PULMONARY  A: -Acute respiratory failure due to complete right lung atelectasis from newly diagnosed SCLC. The patient has completed her first course of chemotherapy with 3 days of cisplatin and etoposide. Her respiratory function appears to be improved and the right lung atelectasis appears to be improving. -Pleural effusion.  -Vent dependent --Review of CXR images today shows continued atelectasis.  --Pt has completed first course of chemo.  -ENT consulted for tracheostomy. Recommended holding off for now. Given elevated risk due to thrombocytopenia. P: -We will continue weaning trials, and see if the patient can be extubated over the next week before any potential tracheostomy is considered again.  CARDIOVASCULAR -A. fib with RVR. Patient was previously on a Cardizem drip, this was changed to amiodarone, the rate is now well controlled. -We'll continue amiodarone drip and changed to oral per protocol.  RENAL CKD, stable.  -Hypernatremia, appears stable, continue  free water replacement and .45 saline.   GASTROINTESTINAL Continue pepcid tube feeds for Gi prophylaxis.   HEMATOLOGIC Stable.   INFECTIOUS A:--  BCx2 10/24; negative.  Bronch Cx. 05/20/15>> normal flora. AFB and fungi pending.    ENDOCRINE A:  Diabetes, hyperglycemia.  P:   Will discontinue every 4 hour insulin, but continued every 4 hours sliding scale insulin. Continue Levemir.  NEUROLOGIC A: Anxiety.     MAJOR EVENTS/TEST RESULTS: Bronch 10/25 with forceps biopsy of RUL>> SCLC.   INDWELLING DEVICES:: Endotracheal tube: 10/25. CVC triple lumen: Right femoral: 10/25 Double-lumen PICC line: 10/31.  ---------------------------------------   ----------------------------------------   Name: Emily Pratt MRN: [id number] DOB: [birth year]    ADMISSION DATE:  05/01/2015    SUBJECTIVE:   Pt currently on the ventilator, can not provide history or review of systems.   Review of Systems:  Patient is currently on the ventilator and cannot provide a review of systems.  VITAL SIGNS: Temp:  [97.1 F (36.2 C)-98.1 F (36.7 C)] 98.1 F (36.7 C) (11/04 0800) Pulse Rate:  [70-99] 80 (11/04 1000) Resp:  [12-22] 16 (11/04 1000) BP: (108-173)/(61-108) 122/62 mmHg (11/04 1000) SpO2:  [91 %-98 %] 92 % (11/04 1000) FiO2 (%):  [35 %] 35 % (11/04 1045) Weight:  [118.1 kg (260 lb 5.8 oz)] 118.1 kg (260 lb 5.8 oz) (11/04 0539) HEMODYNAMICS:   VENTILATOR SETTINGS: Vent Mode:  [-] Spontaneous FiO2 (%):  [35 %] 35 % Set Rate:  [14 bmp] 14 bmp Vt Set:  [500 mL] 500 mL PEEP:  [5 cmH20-8 cmH20] 8 cmH20 Pressure Support:  [12 cmH20] 12 cmH20 INTAKE / OUTPUT:  Intake/Output Summary (Last 24 hours)  at 05/30/15 1119 Last data filed at 05/30/15 1000  Gross per 24 hour  Intake 4323.5 ml  Output   2201 ml  Net 2122.5 ml    PHYSICAL EXAMINATION: Physical Examination:   VS: BP 122/62 mmHg  Pulse 80  Temp(Src) 98.1 F (36.7 C) (Oral)  Resp 16  Ht '5\' 7"'$  (1.702 m)  Wt  118.1 kg (260 lb 5.8 oz)  BMI 40.77 kg/m2  SpO2 92%  General Appearance: No distress  Neuro:without focal findings, mental status normal. HEENT: PERRLA, EOM intact. Pulmonary: normal breath sounds, decreased on right side. But improved compared to yesterday. CardiovascularNormal S1,S2.  No m/r/g.   Abdomen: Benign, Soft, non-tender. Renal:  No costovertebral tenderness  GU:  Not performed at this time. Endocrine: No evident thyromegaly. Skin:   warm, no rashes, no ecchymosis  Extremities: normal, no cyanosis, clubbing.   LABS:   LABORATORY PANEL:   CBC  Recent Labs Lab 05/30/15 0415  WBC 6.5  HGB 11.9*  HCT 37.9  PLT 64*    Chemistries   Recent Labs Lab 05/27/15 0450  05/27/15 1457  05/30/15 0415  NA  --   < > 151*  < > 152*  K  --   < > 3.9  < > 4.0  CL  --   < > 116*  < > 118*  CO2  --   < > 28  < > 31  GLUCOSE  --   < > 233*  < > 95  BUN  --   < > 67*  < > 77*  CREATININE  --   < > 0.97  < > 0.88  CALCIUM  --   < > 8.3*  < > 8.1*  MG 2.0  --   --   --   --   PHOS 4.0  --   --   --   --   AST  --   --  35  --   --   ALT  --   --  49  --   --   ALKPHOS  --   --  43  --   --   BILITOT  --   --  0.8  --   --   < > = values in this interval not displayed.   Recent Labs Lab 05/29/15 1610 05/29/15 1951 05/30/15 0001 05/30/15 0358 05/30/15 0720 05/30/15 1046  GLUCAP 161* 119* 89 83 71 68   No results for input(s): PHART, PCO2ART, PO2ART in the last 168 hours.  Recent Labs Lab 05/25/15 0503 05/26/15 0456 05/27/15 1457  AST 33 38 35  ALT 30 41 49  ALKPHOS 42 44 43  BILITOT 0.6 0.7 0.8  ALBUMIN 2.7* 2.8* 2.6*    Cardiac Enzymes No results for input(s): TROPONINI in the last 168 hours.  RADIOLOGY:  Dg Chest 1 View  05/30/2015  CLINICAL DATA:  Shortness of breath. EXAM: CHEST 1 VIEW COMPARISON:  05/29/2015.  CT 05/20/2015 . FINDINGS: Endotracheal tube, NG tube, right PICC line in stable position. Persistent consolidation of the right lung and  right pleural effusion again noted . Heart size stable. No pneumothorax. No acute osseous abnormality. IMPRESSION: 1. Lines and tubes in stable position. 2. Persistent consolidation of the right lung with prominent right pleural effusion. No interim change from prior exam. Electronically Signed   By: [place]   On: 05/30/2015 07:15   Dg Chest 1 View  05/29/2015  CLINICAL DATA:  Dyspnea, respiratory failure,  COPD, lung mass EXAM: CHEST 1 VIEW COMPARISON:  Portable chest x-ray of May 28, 2015 FINDINGS: There is persistent near total atelectasis of the right lung with some aeration noted in the lower hemi thorax. The left lung is clear. There is no significant mediastinal shift. The cardiac silhouette is normal in size. The endotracheal tube tip lies 4.6 cm above the carina. The esophagogastric tube tip projects below the inferior margin of the image. The right-sided PICC line tip projects over the midportion of the SVC. IMPRESSION: Persistent consolidation of the mid and upper right lung with small amount of residual aerated right lower lung. The left lung is clear. The support tubes are in reasonable position. Electronically Signed   By: David  Martinique M.D.   On: 05/29/2015 07:20       --Marda Stalker, MD.  Pager [phone] [facility] Office Number: [phone]  Patricia Pesa, M.D.  Vilinda Boehringer, M.D.  Merton Border, M.D  [place].  I have personally obtained a history, examined the patient, evaluated laboratory and imaging results, formulated the assessment and plan and placed orders. The Patient requires high complexity decision making for assessment and support, frequent evaluation and titration of therapies, application of advanced monitoring technologies and extensive interpretation of multiple databases. The patient has critical illness that could lead imminently to failure of 1 or more organ systems and requires the highest level of  physician preparedness to intervene.  Critical Care Time devoted to patient care services described in this note is 35 minutes and is exclusive of time spent in procedures.

## 2015-05-30 NOTE — Progress Notes (Signed)
Nutrition Follow-up    INTERVENTION:   EN: recommend continuing current TF regimen as tolerated by pt Coordination of Care: persistent hypernatremia discussed during ICU Rounds; discussed possibility of increasing free water flushes; MD wanting to continue current free water flushes and current IVF.   NUTRITION DIAGNOSIS:   Inadequate oral intake related to acute illness as evidenced by NPO status. Being addressed via TF  GOAL:   Provide needs based on ASPEN/SCCM guidelines  MONITOR:    (Energy Intake, Anthropometrics, Electrolyte/Renal Profile, Digestive System, Electrolyte/Renal Profile)  REASON FOR ASSESSMENT:   Consult Enteral/tube feeding initiation and management  ASSESSMENT:    Pt remains on vent, on precedex, pt alert, follow commands  Diet Order:   NPO  EN: tolerating Vital High Protein at rate of 60 ml/hr, free water 250 mL q 4 hours  Digestive System: rectal tube in place, 150 mL stool documented last night; no signs of TF intolerance  Electrolyte and Renal Profile:  Recent Labs Lab 05/25/15 0503 05/26/15 0456 05/27/15 0450  05/28/15 0452 05/28/15 1442 05/29/15 0441 05/30/15 0415  BUN 58* 65*  --   < > 71*  --  77* 77*  CREATININE 1.07* 1.12*  --   < > 1.10*  --  1.03* 0.88  NA 147* 150*  --   < > 153* 150* 151* 152*  K 4.1 3.9  --   < > 3.9  --  4.3 4.0  MG 1.9 1.9 2.0  --   --   --   --   --   PHOS 3.7 4.0 4.0  --   --   --   --   --   < > = values in this interval not displayed. Glucose Profile:  Recent Labs  05/30/15 0358 05/30/15 0720 05/30/15 1046  GLUCAP 83 71 68   Meds: 1/2 NS at 75 ml/hr, ss novolog, levemir, prednisone  Height:   Ht Readings from Last 1 Encounters:  05/02/2015 '5\' 7"'$  (1.702 m)    Weight:   Wt Readings from Last 1 Encounters:  05/30/15 260 lb 5.8 oz (118.1 kg)   Filed Weights   05/28/15 0500 05/29/15 0510 05/30/15 0539  Weight: 235 lb 3.7 oz (106.7 kg) 258 lb 2.5 oz (117.1 kg) 260 lb 5.8 oz (118.1 kg)     BMI:  Body mass index is 40.77 kg/(m^2).  Estimated Nutritional Needs:   Kcal:  4854-6270 kcals (11-14 kcals/kg) using current wt of 105.6 kg  Protein:  92-122 g (1.5-2.0 g/kg)   Fluid:  1525-1830 mL (25-30 ml/kg)   EDUCATION NEEDS:   No education needs identified at this time  [place], [place], LDN [phone] Pager

## 2015-05-30 NOTE — Progress Notes (Signed)
   05/30/15 1400  Clinical Encounter Type  Visited With Patient;Family  Visit Type Follow-up  Consult/Referral To Chaplain  Spiritual Encounters  Spiritual Needs Other (Comment)  Stress Factors  Patient Stress Factors None identified  Family Stress Factors Other (Comment)  Chaplain was in another unit and saw family member who needed direction to unit where patient was located. Offered assistance and direction by rolling him in wheel chair to unit. Spoke with patient and left them to spend time. Chaplain Keidrick Murty A. Taisha Pennebaker Ext. [phone]

## 2015-05-30 NOTE — Progress Notes (Signed)
RN made Dr.  Ashby Dawes aware that patient's blood glucose is 68 and that 15 units of levimir is now due.  Patient arouses to voice and follows commands. Dr. Ashby Dawes stated "go ahead and give it."  RN asked MD again if the levimir should be given with blood glucose result of 68.  MD again stated "go ahead and give it." MD also gave order for PT consult.

## 2015-05-30 NOTE — Progress Notes (Signed)
Patient calm at this time and resting. Became restless this evening therefore precedex was titrated up. Follows commands and writes with pen and paper. Sister and husband visited this afternoon. NSR per cardiac monitor and stable vital signs. Tube feeds stopped per MD order.

## 2015-05-30 NOTE — Clinical Social Work Note (Signed)
Patient remains in ICU on vent and needing to receive chemo.  Shela Leff MSW,LCSW [phone]

## 2015-05-31 ENCOUNTER — Inpatient Hospital Stay: Payer: Medicare Other

## 2015-05-31 LAB — BASIC METABOLIC PANEL
Anion gap: 5 (ref 5–15)
BUN: 72 mg/dL — ABNORMAL HIGH (ref 6–20)
CHLORIDE: 118 mmol/L — AB (ref 101–111)
CO2: 30 mmol/L (ref 22–32)
CREATININE: 0.9 mg/dL (ref 0.44–1.00)
Calcium: 8.3 mg/dL — ABNORMAL LOW (ref 8.9–10.3)
GFR calc non Af Amer: >60 mL/min (ref >60)
GLUCOSE: 124 mg/dL — AB (ref 65–99)
Potassium: 4.6 mmol/L (ref 3.5–5.1)
Sodium: 153 mmol/L — ABNORMAL HIGH (ref 135–145)

## 2015-05-31 LAB — MAGNESIUM: Magnesium: 1.7 mg/dL (ref 1.7–2.4)

## 2015-05-31 LAB — GLUCOSE, CAPILLARY
Glucose-Capillary: 104 mg/dL — ABNORMAL HIGH (ref 65–99)
Glucose-Capillary: 115 mg/dL — ABNORMAL HIGH (ref 65–99)
Glucose-Capillary: 126 mg/dL — ABNORMAL HIGH (ref 65–99)
Glucose-Capillary: 65 mg/dL (ref 65–99)
Glucose-Capillary: 67 mg/dL (ref 65–99)
Glucose-Capillary: 77 mg/dL (ref 65–99)
Glucose-Capillary: 78 mg/dL (ref 65–99)
Glucose-Capillary: 84 mg/dL (ref 65–99)
Glucose-Capillary: 99 mg/dL (ref 65–99)

## 2015-05-31 LAB — CBC
HCT: 32.1 % — ABNORMAL LOW (ref 35.0–47.0)
Hemoglobin: 10.4 g/dL — ABNORMAL LOW (ref 12.0–16.0)
MCH: 29.9 pg (ref 26.0–34.0)
MCHC: 32.2 g/dL (ref 32.0–36.0)
MCV: 92.6 fL (ref 80.0–100.0)
PLATELETS: 49 10*3/uL — AB (ref 150–440)
RBC: 3.47 MIL/uL — AB (ref 3.80–5.20)
RDW: 18.4 % — ABNORMAL HIGH (ref 11.5–14.5)
WBC: 3.7 10*3/uL (ref 3.6–11.0)

## 2015-05-31 LAB — PHOSPHORUS: Phosphorus: 4 mg/dL (ref 2.5–4.6)

## 2015-05-31 MED ORDER — DEXTROSE 50 % IV SOLN
INTRAVENOUS | Status: AC
  Filled 2015-05-31: qty 50

## 2015-05-31 MED ORDER — DEXTROSE 50 % IV SOLN
25.0000 mL | Freq: Once | INTRAVENOUS | Status: AC
  Administered 2015-05-31: 25 mL via INTRAVENOUS

## 2015-05-31 MED ORDER — DEXMEDETOMIDINE HCL IN NACL 400 MCG/100ML IV SOLN
0.0000 ug/kg/h | INTRAVENOUS | Status: AC
  Administered 2015-05-31 – 2015-06-03 (×10): 0.6 ug/kg/h via INTRAVENOUS
  Filled 2015-05-31 (×13): qty 100

## 2015-05-31 NOTE — Progress Notes (Signed)
[facility] [place] NAME: Emily Pratt    MR#:  [id number]  DATE OF BIRTH:  November 07, 1945  SUBJECTIVE:  CHIEF COMPLAINT: Pt is awake, not on sedation, on vent. REVIEW OF SYSTEMS:   Unable to obtain  DRUG ALLERGIES:   Allergies  Allergen Reactions  . Atorvastatin Other (See Comments)    Does not remember why she had Intolerance to Lipitor.  . Fentanyl Itching  . Rosuvastatin Anxiety    Chest tigtness and feeling as if she had heartburn.    VITALS:  Blood pressure 99/58, pulse 67, temperature 97.6 F (36.4 C), temperature source Axillary, resp. rate 14, height '5\' 7"'$  (1.702 m), weight 118 kg (260 lb 2.3 oz), SpO2 95 %.  PHYSICAL EXAMINATION:  GENERAL:  69 y.o.-year-old patient lying in the bed, awake, on vent. Obese. EYES: Pupils equal, round, reactive to light and accommodation. No scleral icterus.  HEENT: Head atraumatic, normocephalic. ETT in place NECK:  Supple, no jugular venous distention. No thyroid enlargement, no tenderness.  LUNGS: Better air entrance on the right,  diminished breath sounds on the right , intermittent expiratory wheezing and rhonchi , good air movements on the left , on ventilator support. CARDIOVASCULAR: S1, S2 normal. No murmurs, rubs, or gallops.  ABDOMEN: Soft, nontender, distended. Bowel sounds slugish. No organomegaly or mass.  EXTREMITIES: +1 pedal edema, cyanosis, or clubbing.  NEUROLOGIC: CN 2-12 intact, strength 5/5,  Follows limited commands PSYCHIATRIC: awake. SKIN: Sacral decubitus ulcer   LABORATORY PANEL:   CBC  Recent Labs Lab 05/31/15 0445  WBC 3.7  HGB 10.4*  HCT 32.1*  PLT 49*   ------------------------------------------------------------------------------------------------------------------  Chemistries   Recent Labs Lab 05/27/15 1457  05/31/15 0445  NA 151*  < > 153*  K 3.9  < > 4.6  CL 116*  < > 118*  CO2 28  < > 30  GLUCOSE 233*  < > 124*  BUN 67*  < > 72*   CREATININE 0.97  < > 0.90  CALCIUM 8.3*  < > 8.3*  MG  --   --  1.7  AST 35  --   --   ALT 49  --   --   ALKPHOS 43  --   --   BILITOT 0.8  --   --   < > = values in this interval not displayed. ------------------------------------------------------------------------------------------------------------------  Cardiac Enzymes No results for input(s): TROPONINI in the last 168 hours. ------------------------------------------------------------------------------------------------------------------  RADIOLOGY:  Dg Chest 1 View  05/30/2015  CLINICAL DATA:  Shortness of breath. EXAM: CHEST 1 VIEW COMPARISON:  05/29/2015.  CT 05/20/2015 . FINDINGS: Endotracheal tube, NG tube, right PICC line in stable position. Persistent consolidation of the right lung and right pleural effusion again noted . Heart size stable. No pneumothorax. No acute osseous abnormality. IMPRESSION: 1. Lines and tubes in stable position. 2. Persistent consolidation of the right lung with prominent right pleural effusion. No interim change from prior exam. Electronically Signed   By: [place]   On: 05/30/2015 07:15   Dg Abd 1 View  05/30/2015  CLINICAL DATA:  Lung cancer.  Distended abdomen. EXAM: ABDOMEN - 1 VIEW COMPARISON:  05/20/2015 FINDINGS: Mild gaseous distention of the stomach and transverse colon. No small bowel dilatation to suggest obstruction. No organomegaly. No free air. IMPRESSION: Mild gaseous distention of the stomach and transverse colon. This may reflect mild ileus. Electronically Signed   By: Rolm Baptise M.D.   On: 05/30/2015 16:42  EKG:   Orders placed or performed during the hospital encounter of 05/12/2015  . EKG 12-Lead  . EKG 12-Lead    ASSESSMENT AND PLAN:   1. Acute on chronic respiratory failure with hypoxia and Hypercapnia,  - Multifactorial due to large right-sided pleural effusion/ CHF/ COPD / mass- SCLC - Pulmonology and oncology is following, failed multiple extubation  attempts.  - Continue nebulizer, steroids, antibiotics. Oncology initiated chemotherapy October 31st 2016 with cisplatin and etoposide with hopes to shrink the mass. Timing of shrinkage  is unclear,  chest x-ray does not show any improvement. Appreciate palliative care input , ENT saw patient and discussed this oncologist , no tracheostomy tube is recommended at this time but revisit issue in about 1 week. Continue vent, f/u pulmonary physician.  2. Acute Congestive heart failure: Mixed systolic and diastolic  - Echo 54/56 shows ejection fraction 30 -35% with abnormal filling - Continue metoprolol per cardiology's recommendations , resumed losartan . Vital signs are good  3. Acute on chronic kidney disease:  - Due to ATN, hypovolemia and hypotension, Cr. Is normal but BUN is at 70's. - Nephrology signed off. Patient was restarted on losartan.  Continue 1/2 NS iv, F/u BMP.  4. Essential hypertension:  continue metoprolol and losartan, norvasc, better blood pressure control  5. Diabetes mellitus type 2: Hold home medications Continue levemir 15 unit HS and sliding scale, blood glucose is controlled. Now holding tube feed due to mild ileus.  6.  Small cell lung cancer, likely stage IV per oncology - Status post bronchoscopy 10/25, pathology show small cell lung cancer - Long-term history of heavy smoking - pathology reports were discussed with the patient and sister in the past.   - Oncology initiated chemotherapy ,  PICC line for chemotherapy is placed 05/26/2015- Radiation oncology has been consulted and patient may benefit from radiology oncology intervention as well, since she is intubated. It might be difficult to perform. Following patient's platelet count closely with chemotherapy  7. Postobstructive pneumonia versus atelectasis - Blood cultures negative to date, respiratory, BAL, AFB normal flora - Discontinue vancomycin and Zosyn, completed 7 day Levaquin course  8. Arrhythmia,  SVT versus paroxysmal atrial fibrillation during bronchoscopy, now in sinus rhythm, continue metoprolol per cardiology's recommendations  9 thrombocytopenia, following closely, may need transfusions with chemotherapy for lung ca, It is holding the decision of doing trach.  10. Hypernatremia, patient is receiving advanced water flushes with tube feeds, but her sodium level is worse. On 1/2 NS iv. F/u BMP.  11. UTI   Rocephin IV- check Ur cx- 05/29/15.  12. Ileus   Hold feeding and f/u surgical consult- repeat xray: mild ileus.   All the records are reviewed and case discussed with the patient's husband and sister at the bedside.  CODE STATUS: Full code  TOTAL CRITICAL CARE TIME TAKING CARE OF THIS PATIENT: 43 minutes.    Demetrios Loll M.D on 05/31/2015 at 8:45 AM  Between 7am to 6pm - Pager - [phone] After 6pm go to [email or web address] - password [email or web address] [facility]  Office  [phone]  CC: Primary care physician; Dagoberto Ligas, MD

## 2015-05-31 NOTE — Progress Notes (Signed)
[place]  [phone] [phone]  ID: Emily Pratt OB: 04-04-1946  MR#: [id number]  ZSW#:[id number]  Patient Care Team: Dagoberto Ligas, MD as PCP - General (Internal Medicine)  CHIEF COMPLAINT:  Chief Complaint  Patient presents with  . Respiratory Distress    INTERVAL HISTORY: Patient currently sleeping. She remains intubated, but appears comfortable. She has completed cycle 1 of Carboplatinum and Etoposide.   REVIEW OF SYSTEMS:   Review of Systems  Unable to perform ROS: intubated    PAST MEDICAL HISTORY: Past Medical History  Diagnosis Date  . Anxiety   . Arthritis   . COPD (chronic obstructive pulmonary disease) ([place])   . CHF (congestive heart failure) ([place])   . Hyperlipidemia   . Hypertension   . Diabetes mellitus without complication ([place])   . Chronic kidney disease   . Neuromuscular disorder ([place])     PAST SURGICAL HISTORY: Past Surgical History  Procedure Laterality Date  . Joint replacement Bilateral 2006    both knees  . Ankle surgery      FAMILY HISTORY Family History  Problem Relation Age of Onset  . Diabetes Mother   . Cancer Father   . Diabetes Sister   . Stroke Sister        ADVANCED DIRECTIVES:    HEALTH MAINTENANCE: Social History  Substance Use Topics  . Smoking status: Current Every Day Smoker    Types: Cigarettes  . Smokeless tobacco: None     Comment: 0.5 pack /day  . Alcohol Use: No     Colonoscopy:  PAP:  Bone density:  Lipid panel:  Allergies  Allergen Reactions  . Atorvastatin Other (See Comments)    Does not remember why she had Intolerance to Lipitor.  . Fentanyl Itching  . Rosuvastatin Anxiety    Chest tigtness and feeling as if she had heartburn.    Current Facility-Administered Medications  Medication Dose Route Frequency Provider Last Rate Last Dose  . 0.45 % sodium chloride infusion   Intravenous Continuous Laverle Hobby, MD 75 mL/hr at 05/31/15 0654      . acetaminophen (TYLENOL) tablet 650 mg  650 mg Oral Q6H PRN Harrie Foreman, MD   650 mg at 05/29/15 3220   Or  . acetaminophen (TYLENOL) suppository 650 mg  650 mg Rectal Q6H PRN Harrie Foreman, MD      . allopurinol (ZYLOPRIM) tablet 100 mg  100 mg Oral Daily Laverle Hobby, MD   100 mg at 05/31/15 1016  . amLODipine (NORVASC) tablet 5 mg  5 mg Oral Daily Theodoro Grist, MD   5 mg at 05/31/15 1016  . antiseptic oral rinse solution (CORINZ)  7 mL Mouth Rinse QID Vishal Mungal, MD   7 mL at 05/30/15 1708  . budesonide (PULMICORT) nebulizer solution 0.5 mg  0.5 mg Nebulization BID Flora Lipps, MD   0.5 mg at 05/31/15 0808  . cefTRIAXone (ROCEPHIN) 1 g in dextrose 5 % 50 mL IVPB  1 g Intravenous Q24H Vaughan Basta, MD   1 g at 05/31/15 1027  . chlorhexidine gluconate (PERIDEX) 0.12 % solution 15 mL  15 mL Mouth Rinse BID Vishal Mungal, MD   15 mL at 05/31/15 0834  . dexmedetomidine (PRECEDEX) 400 MCG/100ML (4 mcg/mL) infusion  0-1.2 mcg/kg/hr Intravenous Continuous Laverle Hobby, MD 15.8 mL/hr at 05/31/15 0644 0.6 mcg/kg/hr at 05/31/15 0644  . famotidine (PEPCID) tablet 20 mg  20 mg Per Tube BID Theodoro Grist, MD   20 mg at  05/31/15 1014  . fentaNYL (SUBLIMAZE) injection 25-100 mcg  25-100 mcg Intravenous Q2H PRN Wilhelmina Mcardle, MD   100 mcg at 05/30/15 1350  . insulin aspart (novoLOG) injection 0-15 Units  0-15 Units Subcutaneous 6 times per day Wilhelmina Mcardle, MD   2 Units at 05/31/15 0400  . insulin detemir (LEVEMIR) injection 15 Units  15 Units Subcutaneous Q24H Laverle Hobby, MD   15 Units at 05/31/15 1028  . ipratropium-albuterol (DUONEB) 0.5-2.5 (3) MG/3ML nebulizer solution 3 mL  3 mL Nebulization Q6H Vishal Mungal, MD   3 mL at 05/31/15 0808  . losartan (COZAAR) tablet 100 mg  100 mg Oral Daily Theodoro Grist, MD   100 mg at 05/31/15 1013  . metoprolol (LOPRESSOR) tablet 100 mg  100 mg Per Tube BID Wilhelmina Mcardle, MD   Stopped at 05/31/15 1038  . midazolam  (VERSED) injection 1-2 mg  1-2 mg Intravenous Q2H PRN Wilhelmina Mcardle, MD   1 mg at 05/30/15 1912  . ondansetron (ZOFRAN) tablet 4 mg  4 mg Oral Q6H PRN Harrie Foreman, MD       Or  . ondansetron [facility]) injection 4 mg  4 mg Intravenous Q6H PRN Harrie Foreman, MD      . pregabalin (LYRICA) capsule 50 mg  50 mg Per Tube TID Wilhelmina Mcardle, MD   50 mg at 05/31/15 1013    OBJECTIVE: Filed Vitals:   05/31/15 0700  BP: 99/58  Pulse: 67  Temp:   Resp: 14     Body mass index is 40.73 kg/(m^2).    ECOG FS:4 - Bedbound  General:  intubated, but alert Eyes: Pink conjunctiva, anicteric sclera. HEENT:  ET tube in place. Lungs: Clear to auscultation bilaterally. Heart: Regular rate and rhythm. No rubs, murmurs, or gallops. Abdomen: Soft, nontender, nondistended. No organomegaly noted, normoactive bowel sounds. Musculoskeletal: No edema, cyanosis, or clubbing. Neuro: Lethargic.  Skin: No rashes or petechiae noted.   LAB RESULTS:  Lab Results  Component Value Date   NA 153* 05/31/2015   K 4.6 05/31/2015   CL 118* 05/31/2015   CO2 30 05/31/2015   GLUCOSE 124* 05/31/2015   BUN 72* 05/31/2015   CREATININE 0.90 05/31/2015   CALCIUM 8.3* 05/31/2015   PROT 5.3* 05/27/2015   ALBUMIN 2.6* 05/27/2015   AST 35 05/27/2015   ALT 49 05/27/2015   ALKPHOS 43 05/27/2015   BILITOT 0.8 05/27/2015   GFRNONAA >60 05/31/2015   GFRAA >60 05/31/2015    Lab Results  Component Value Date   WBC 3.7 05/31/2015   NEUTROABS 4.1 04/24/2015   HGB 10.4* 05/31/2015   HCT 32.1* 05/31/2015   MCV 92.6 05/31/2015   PLT 49* 05/31/2015     STUDIES: Dg Chest 1 View  05/31/2015  CLINICAL DATA:  Dyspnea. EXAM: CHEST 1 VIEW COMPARISON:  05/30/2015 FINDINGS: ET tube tip is above the carina. There is a nasogastric tube in place. Right arm PICC line tip is in the SVC. Loculated right pleural effusion is unchanged from previous exam. Cardiac enlargement and aortic atherosclerosis again noted. IMPRESSION: 1.  No change and large loculated right pleural effusion. 2. Cardiac enlargement and aortic atherosclerosis. Electronically Signed   By: Kerby Moors M.D.   On: 05/31/2015 09:31   Dg Chest 1 View  05/30/2015  CLINICAL DATA:  Shortness of breath. EXAM: CHEST 1 VIEW COMPARISON:  05/29/2015.  CT 05/20/2015 . FINDINGS: Endotracheal tube, NG tube, right PICC line in stable position. Persistent consolidation  of the right lung and right pleural effusion again noted . Heart size stable. No pneumothorax. No acute osseous abnormality. IMPRESSION: 1. Lines and tubes in stable position. 2. Persistent consolidation of the right lung with prominent right pleural effusion. No interim change from prior exam. Electronically Signed   By: Marcello Moores  Register   On: 05/30/2015 07:15   Dg Chest 1 View  05/29/2015  CLINICAL DATA:  Dyspnea, respiratory failure, COPD, lung mass EXAM: CHEST 1 VIEW COMPARISON:  Portable chest x-ray of May 28, 2015 FINDINGS: There is persistent near total atelectasis of the right lung with some aeration noted in the lower hemi thorax. The left lung is clear. There is no significant mediastinal shift. The cardiac silhouette is normal in size. The endotracheal tube tip lies 4.6 cm above the carina. The esophagogastric tube tip projects below the inferior margin of the image. The right-sided PICC line tip projects over the midportion of the SVC. IMPRESSION: Persistent consolidation of the mid and upper right lung with small amount of residual aerated right lower lung. The left lung is clear. The support tubes are in reasonable position. Electronically Signed   By: David  Martinique M.D.   On: 05/29/2015 07:20   Dg Chest 1 View  05/28/2015  CLINICAL DATA:  Hypoxia EXAM: CHEST 1 VIEW COMPARISON:  May 26, 2015 FINDINGS: Endotracheal tube tip is 4.6 cm above the carina. Nasogastric tube tip and side port below the diaphragm. Central catheter tip is in the superior cava. No pneumothorax. There is complete  opacification of the right hemithorax with volume loss and probable effusion. There is elevation of the right hemidiaphragm. The left lung is clear. The heart size is within normal limits. Pulmonary vascularity on the left is within normal limits. Pulmonary vascularity on the right is obscured. There is atherosclerotic change throughout the aorta. IMPRESSION: Tube and catheter positions as described without pneumothorax. Complete opacification of the right hemithorax remains with evidence of volume loss and probable pleural effusion. Left lung clear. No change in cardiac silhouette. Electronically Signed   By: Lowella Grip III M.D.   On: 05/28/2015 07:18   Dg Chest 1 View  05/26/2015  CLINICAL DATA:  Wheezing and shortness of breath EXAM: CHEST 1 VIEW COMPARISON:  Portable chest x-ray of today's date FINDINGS: There is persistent 0 opacification of the mid and upper thirds of the right hemi thorax. Only a small amount of aerated lung is visible in the lower hemi thorax. Pleural fluid on the right is suspected. The cardiac silhouette is mildly enlarged. The left lung is well-expanded. There is no focal infiltrate or pleural effusion on the left. The endotracheal tube tip projects 4.2 cm above the carina. The PICC line tip on the left overlies the junction of the proximal and midportions of the SVC. IMPRESSION: Fairly stable appearance of the chest since the previous study with opacification of the upper 2/3 of the right hemithorax consistent with atelectasis-pneumonia and left pleural effusion. The left lung remains clear. Electronically Signed   By: David  Martinique M.D.   On: 05/26/2015 13:51   Dg Abd 1 View  05/30/2015  CLINICAL DATA:  Lung cancer.  Distended abdomen. EXAM: ABDOMEN - 1 VIEW COMPARISON:  05/20/2015 FINDINGS: Mild gaseous distention of the stomach and transverse colon. No small bowel dilatation to suggest obstruction. No organomegaly. No free air. IMPRESSION: Mild gaseous distention of the  stomach and transverse colon. This may reflect mild ileus. Electronically Signed   By: Rolm Baptise M.D.  On: 05/30/2015 16:42   Ct Chest Wo Contrast  05/20/2015  CLINICAL DATA:  69 year old who presented with acute hypercapnic respiratory failure, current history of COPD and CHF, stage 3 chronic kidney disease, with an enlarging right pleural effusion and right paratracheal soft tissue on chest x-ray yesterday. EXAM: CT CHEST WITHOUT CONTRAST TECHNIQUE: Multidetector CT imaging of the chest was performed following the standard protocol without IV contrast. Intravenous contrast was not administered due to the patient's chronic kidney disease. COMPARISON:  No prior CT.  Chest x-rays 04/30/2015 and earlier. FINDINGS: Best seen on coronal reformatted images is abrupt occlusion of the right upper lobe bronchus and the right bronchus intermedius centrally. As a result, there is dense consolidation in the entire right lung with a areas of hypoattenuation giving a "drowned lung" appearance. Without intravenous contrast, it is difficult to visualize the large obstructing mass as it has similar attenuation to the consolidated lung. There is an associated small to moderate sized right pleural effusion. No pulmonary parenchymal nodules or masses involving the left lung. Linear scarring involving the medial left lower lobe. No confluent airspace consolidation in the left lung. No left pleural effusion. Apparent pleural thickening throughout the left hemithorax is related to abundant subpleural fat. Bulky right paratracheal conglomerate lymphadenopathy in station 2R and station 4R measuring approximately 5.6 x 5.0 x 7.4 cm. Enlarged lymph nodes elsewhere in the right side of the mediastinum. Enlarged right retroclavicular lymph nodes, the largest measuring approximately 2.8 x 3.8 x 3.1 cm. The bulky mediastinal lymphadenopathy compresses the superior vena cava, and there is a prominent right internal mammary vein  collateral. Heart size upper normal. No pericardial effusion. Mild LAD and right coronary artery atherosclerosis. Moderate atherosclerosis involving the thoracic and upper abdominal aorta and their visualized branches. Approximate 3 cm cyst involving the posterior segment right lobe of liver. Within the limits of the unenhanced technique, no solid mass involving visualized liver. Approximate 1.6 x 2.6 x 2.2 cm low-attenuation mass involving the left adrenal gland. Solitary punctate calcification involving the proximal body of the pancreas. Visualized upper abdomen otherwise unremarkable for the unenhanced technique. IMPRESSION: 1. Obstructing central mass involving the right lung with occlusion of the right upper lobe bronchus and the bronchus intermedius with complete atelectasis of the right lung. The mass is difficult to visualize as it is of similar attenuation to the atelectatic lung. Bronchoscopy is recommended in further evaluation and for diagnosis. 2. Small to moderate-sized right pleural effusion. 3. Metastatic mediastinal lymphadenopathy as detailed above, the largest conglomerate nodal mass in the right paratracheal region. 4. Mild compression of the superior vena cava with a prominent right internal mammary vein collateral. 5. Approximate 3 cm cyst involving the posterior segment right lobe of the visualized liver. 6. Approximate 2.6 cm low-attenuation mass involving the left adrenal gland, likely adenoma. Electronically Signed   By: Evangeline Dakin M.D.   On: 05/20/2015 12:41   US Renal  05/21/2015  CLINICAL DATA:  Acute renal failure EXAM: RENAL / URINARY TRACT ULTRASOUND COMPLETE COMPARISON:  None. FINDINGS: Right Kidney: Length: 11 cm. Echogenicity within normal limits. No mass or hydronephrosis visualized. There is cortical thinning probable due to atrophy Left Kidney: Length: 12.2 cm. Cortical thinning is noted probable due to atrophy. Echogenicity within normal limits. No mass or  hydronephrosis visualized. Bladder: The urinary bladder is decompressed with Foley catheter. IMPRESSION: 1. No hydronephrosis. No renal calculi. Bilateral cortical thinning probable due to atrophy. Decompressed urinary bladder with Foley catheter. Electronically Signed   By:  Lahoma Crocker M.D.   On: 05/21/2015 09:25   Dg Chest Port 1 View  05/26/2015  CLINICAL DATA:  Respiratory failure. EXAM: PORTABLE CHEST 1 VIEW COMPARISON:  05/25/2015 .  CT 05/20/2015. FINDINGS: Endotracheal tube and NG tube noted in stable position. Interim increase consolidation in the right upper lobe. Persistent right pleural effusion. Left lung is clear. Stable cardiomegaly. No pneumothorax. No acute osseus abnormality P IMPRESSION: Interim increase in consolidation of the right upper lobe with persistent right pleural effusion. Persistent occlusion of the right mainstem bronchus appears to be present. Electronically Signed   By: [place]   On: 05/26/2015 07:13   Dg Chest Port 1 View  05/25/2015  CLINICAL DATA:  Respiratory failure. EXAM: PORTABLE CHEST 1 VIEW COMPARISON:  05/21/2015 FINDINGS: The ET tube tip is above the carina. The nasogastric tube tip is below the GE junction. Normal heart size. Aortic atherosclerosis. Partially loculated right pleural effusion is again identified and appears unchanged from the previous exam. Left lung is clear. IMPRESSION: 1. Loculated right pleural effusion is stable. 2. Left lung is clear. Electronically Signed   By: Kerby Moors M.D.   On: 05/25/2015 09:03   Dg Chest Port 1 View  05/21/2015  CLINICAL DATA:  Respiratory failure, intubated EXAM: PORTABLE CHEST 1 VIEW COMPARISON:  05/20/2015 FINDINGS: Cardiomegaly again noted. Stable endotracheal and NG tube position. Again noted almost complete opacification of the right hemi thorax. Chronic elevation of the right hemidiaphragm. Mild left basilar atelectasis. No convincing pulmonary edema. No pneumothorax. Atherosclerotic  calcifications of thoracic aorta again noted. IMPRESSION: Stable endotracheal and NG tube position. Again noted almost complete opacification of the right hemi thorax. Chronic elevation of the right hemidiaphragm. Mild left basilar atelectasis. No convincing pulmonary edema. No pneumothorax. Atherosclerotic calcifications of thoracic aorta again noted. Electronically Signed   By: Lahoma Crocker M.D.   On: 05/21/2015 08:22   Dg Chest Port 1 View  05/20/2015  CLINICAL DATA:  Acute respiratory failure with hypoxia. Status post bronchoscopy and line placement. EXAM: PORTABLE CHEST 1 VIEW COMPARISON:  05/20/2015 at 1302 hours FINDINGS: Following bronchoscopy, there is now partial aeration of the right lung noted in the right upper to mid lung. There is also some aeration now noted in the right lower lung with a band of opacity noted across the mid lung bordering the minor fissure. Left lung is hyperexpanded but essentially clear. No pneumothorax. Endotracheal tube is stable with its tip 4.7 cm above the carina. Orogastric tube passes below the diaphragm. IMPRESSION: 1. Improved right lung aeration following bronchoscopy. No pneumothorax. 2. No other change. Electronically Signed   By: Lajean Manes M.D.   On: 05/20/2015 17:42   Dg Chest Port 1 View  05/20/2015  CLINICAL DATA:  Hypoxia EXAM: PORTABLE CHEST 1 VIEW COMPARISON:  Chest CT obtained earlier in the day; chest radiograph May 19, 2015 FINDINGS: Endotracheal tube tip is 4.6 cm above the carina. Nasogastric tube tip and side port are below the diaphragm. No pneumothorax. There is now complete opacification of the right hemi thorax, felt to be due to a combination of effusion and consolidation. Left lung is clear. Heart size is within normal limits. There is atherosclerotic change in the aorta. Mass lesions seen on CT are obscured on the right by the diffuse consolidation and effusion. IMPRESSION: Tube positions as described without pneumothorax. Left lung  clear. Complete opacification on the right. Cardiac silhouette within normal limits. Atherosclerotic calcification noted. Electronically Signed   By: Gwyndolyn Saxon  Jasmine December III M.D.   On: 05/20/2015 13:18   Dg Chest Portable 1 View  05/08/2015  CLINICAL DATA:  69 year old female with increasing shortness of breath Coll the today. EXAM: PORTABLE CHEST 1 VIEW COMPARISON:  Chest x-ray 04/24/2015. FINDINGS: Enlarging right pleural effusion. Extensive opacities throughout the right mid to lower lung, which may simply reflect passive atelectasis, however, underlying airspace consolidation or underlying mass is not excluded. Persistent prominence of right paratracheal soft tissue highly concerning for lymphadenopathy. Left lung appears relatively clear, although the left base is poorly visualized medially (likely technique related). No evidence of pulmonary edema. Heart size is normal. Atherosclerotic calcifications in the thoracic aorta. IMPRESSION: 1. Overall, there has been significant progression compared to the prior chest x-ray from 04/24/2015, with enlarging now a large right pleural effusion and worsening aeration throughout the right lung. This is unusual in the setting of a treated pneumonia, and given the presence of paratracheal soft tissue thickening on the right which is suspicious for lymphadenopathy, underlying neoplasm is strongly suspected. Further evaluation with contrast enhanced chest CT in is strongly recommended in the near future to evaluate for underlying neoplasm. 2. Atherosclerosis. Electronically Signed   By: Vinnie Langton M.D.   On: 05/01/2015 01:27   Dg Abd Portable 1v  05/20/2015  CLINICAL DATA:  OG an indentation. EXAM: PORTABLE ABDOMEN - 1 VIEW COMPARISON:  None. FINDINGS: Enteric tube tip is adequately positioned in the body of the stomach. Mildly prominent gas-filled loops of small bowel are noted within the right lower quadrant. Overall bowel gas pattern is indeterminate. No  evidence of free intraperitoneal air seen. Large dense opacity noted at the right lung base. IMPRESSION: 1. OG tube appears adequately positioned with tip in the region of the stomach body. 2. Large dense opacity at the right lung base, incompletely imaged, which could be consolidation or effusion. Electronically Signed   By: Franki Cabot M.D.   On: 05/20/2015 13:19    ASSESSMENT:  Small cell lung cancer.   PLAN:    1. Small cell lung cancer: Patient is likely stage IV given her suspicious adrenal lesion, but full staging workup cannot be completed given her acute illness. Patient has had multiple failed attempts at extubation likely secondary to external compression of her mass. Patient has completed cycle 1 of treatment with Carboplatinum and Etoposide. Cycle 2 will occur in approximately 3 weeks. She will ultimately require port placement once she is extubated. XRT would be helpful, but there are patient safety concerns transporting to radiation oncology.   2. Thrombocytopenia: Most likely multifactorial secondary to acute illness, chemotherapy is also a contributing factor. Plts today of 49K. Will continue to monitor daily. 3. Tracheostomy: Agree that it is unsafe for surgery at this time given her thrombocytopenia which could decline after receiving chemotherapy this week. Case was previously discussed with ENT and appreciate their input.  4. Mechanical ventilation: Repeat extubation per pulmonology.   Will repeat CT scan chest of next week if patient remains intubated. Will follow.   Evlyn Kanner, NP   05/31/2015 11:46 AM

## 2015-05-31 NOTE — Progress Notes (Signed)
Pharmacy Consult for Electrolyte Monitoring   Allergies  Allergen Reactions  . Atorvastatin Other (See Comments)    Does not remember why she had Intolerance to Lipitor.  . Fentanyl Itching  . Rosuvastatin Anxiety    Chest tigtness and feeling as if she had heartburn.    Patient Measurements: Height: '5\' 7"'$  (170.2 cm) Weight: 260 lb 5.8 oz (118.1 kg) IBW/kg (Calculated) : 61.6   Vital Signs: Temp: 97 F (36.1 C) (11/04 1912) Temp Source: Axillary (11/04 1912) BP: 110/69 mmHg (11/05 0100) Pulse Rate: 84 (11/05 0100) Intake/Output from previous day: 11/04 0701 - 11/05 0700 In: 2702.8 [I.V.:1652.8; NG/GT:1000; IV Piggyback:50] Out: 3550 [Urine:2300; Emesis/NG output:750; Stool:500] Intake/Output from this shift: Total I/O In: 688.8 [I.V.:688.8] Out: -   Labs:  Recent Labs  05/29/15 0441 05/30/15 0415 05/31/15 0445  WBC 6.6 6.5 3.7  HGB 12.7 11.9* 10.4*  HCT 39.6 37.9 32.1*  PLT 59* 64* 49*     Recent Labs  05/29/15 0441 05/30/15 0415 05/31/15 0445  NA 151* 152* 153*  K 4.3 4.0 4.6  CL 119* 118* 118*  CO2 '28 31 30  '$ GLUCOSE 205* 95 124*  BUN 77* 77* 72*  CREATININE 1.03* 0.88 0.90  CALCIUM 8.2* 8.1* 8.3*  MG  --   --  1.7  PHOS  --   --  4.0   Estimated Creatinine Clearance: 80.6 mL/min (by C-G formula based on Cr of 0.9).    Recent Labs  05/30/15 2231 05/31/15 0015 05/31/15 0412  GLUCAP 97 115* 126*    Medical History: Past Medical History  Diagnosis Date  . Anxiety   . Arthritis   . COPD (chronic obstructive pulmonary disease) ([place])   . CHF (congestive heart failure) ([place])   . Hyperlipidemia   . Hypertension   . Diabetes mellitus without complication ([place])   . Chronic kidney disease   . Neuromuscular disorder ([facility])     Medications:  Scheduled:  . allopurinol  100 mg Oral Daily  . amLODipine  5 mg Oral Daily  . antiseptic oral rinse  7 mL Mouth Rinse QID  . budesonide (PULMICORT) nebulizer solution  0.5 mg Nebulization BID  .  cefTRIAXone (ROCEPHIN)  IV  1 g Intravenous Q24H  . chlorhexidine gluconate  15 mL Mouth Rinse BID  . famotidine  20 mg Per Tube BID  . heparin subcutaneous  5,000 Units Subcutaneous Q12H  . insulin aspart  0-15 Units Subcutaneous 6 times per day  . insulin detemir  15 Units Subcutaneous Q24H  . ipratropium-albuterol  3 mL Nebulization Q6H  . losartan  100 mg Oral Daily  . metoprolol  100 mg Per Tube BID  . pregabalin  50 mg Per Tube TID   Infusions:  . sodium chloride 75 mL/hr at 05/30/15 1615  . dexmedetomidine 1 mcg/kg/hr (05/31/15 0200)    Assessment: Patient started on 1/2NS @ 72m on 11/2. Patient's sodium remains elevated but stable.   Plan:  No additional supplementation warranted at this time.  Will f/u AM labs.   Pharmacy will continue to monitor labs and replace electrolytes as needed.   11/5 AM electrolyte panel WNL except for Na+ still elevated. No indication for replacement. Will f/u BMP in AM.  MSim Boast PharmD, BCPS  05/31/2015

## 2015-05-31 NOTE — Progress Notes (Signed)
Nutrition Follow-up     INTERVENTION:  EN: Spoke with Dr. Mortimer Fries and wanting to continue to hold tube feeding at this time   NUTRITION DIAGNOSIS:   Inadequate oral intake related to acute illness as evidenced by NPO status.    GOAL:   Provide needs based on ASPEN/SCCM guidelines    MONITOR:    (Energy Intake, Anthropometrics, Electrolyte/Renal Profile, Digestive System, Electrolyte/Renal Profile)  REASON FOR ASSESSMENT:   Consult Enteral/tube feeding initiation and management  ASSESSMENT:      Pt with mild ileus per MD note, tube feeding stopped yesterday pm.     Current Nutrition: tube feeding stopped   Gastrointestinal Profile: OG hooked to LIS noted 76m out yesterday pm, abdomen less distended, softer, rectal tube in place Last BM: 11/5   Scheduled Medications:  . allopurinol  100 mg Oral Daily  . amLODipine  5 mg Oral Daily  . antiseptic oral rinse  7 mL Mouth Rinse QID  . budesonide (PULMICORT) nebulizer solution  0.5 mg Nebulization BID  . cefTRIAXone (ROCEPHIN)  IV  1 g Intravenous Q24H  . chlorhexidine gluconate  15 mL Mouth Rinse BID  . famotidine  20 mg Per Tube BID  . insulin aspart  0-15 Units Subcutaneous 6 times per day  . insulin detemir  15 Units Subcutaneous Q24H  . ipratropium-albuterol  3 mL Nebulization Q6H  . losartan  100 mg Oral Daily  . metoprolol  100 mg Per Tube BID  . pregabalin  50 mg Per Tube TID    Continuous Medications:  . sodium chloride 75 mL/hr at 05/31/15 0654  . dexmedetomidine 0.6 mcg/kg/hr (05/31/15 0644)     Electrolyte/Renal Profile and Glucose Profile:   Recent Labs Lab 05/26/15 0456 05/27/15 0450  05/29/15 0441 05/30/15 0415 05/31/15 0445  NA 150*  --   < > 151* 152* 153*  K 3.9  --   < > 4.3 4.0 4.6  CL 112*  --   < > 119* 118* 118*  CO2 30  --   < > '28 31 30  '$ BUN 65*  --   < > 77* 77* 72*  CREATININE 1.12*  --   < > 1.03* 0.88 0.90  CALCIUM 8.7*  --   < > 8.2* 8.1* 8.3*  MG 1.9 2.0  --   --    --  1.7  PHOS 4.0 4.0  --   --   --  4.0  GLUCOSE 195*  --   < > 205* 95 124*  < > = values in this interval not displayed. Protein Profile:  Recent Labs Lab 05/25/15 0503 05/26/15 0456 05/27/15 1457  ALBUMIN 2.7* 2.8* 2.6*      Weight Trend since Admission: Filed Weights   05/29/15 0510 05/30/15 0539 05/31/15 0500  Weight: 258 lb 2.5 oz (117.1 kg) 260 lb 5.8 oz (118.1 kg) 260 lb 2.3 oz (118 kg)     Diet Order:   NPO  Skin:   reviewed   Height:   Ht Readings from Last 1 Encounters:  05/10/2015 '5\' 7"'$  (1.702 m)    Weight:   Wt Readings from Last 1 Encounters:  05/31/15 260 lb 2.3 oz (118 kg)   BMI:  Body mass index is 40.73 kg/(m^2).  Estimated Nutritional Needs:   Kcal:  11610-9604kcals (11-14 kcals/kg) using current wt of 105.6 kg  Protein:  92-122 g (1.5-2.0 g/kg)   Fluid:  1525-1830 mL (25-30 ml/kg)   EDUCATION NEEDS:   No education  needs identified at this time  [facility]. Zenia Resides, [place], [place] (pager)

## 2015-05-31 NOTE — Progress Notes (Signed)
[facility] Progess Note   ASSESSMENT/PLAN   Patient is a 69 year old female with newly diagnosed right complete lung atelectasis due to small cell lung cancer, which is newly diagnosed on this admission. The patient is vent dependent. Due to the right lung atelectasis. She has completed course of chemotherapy, trach is being considered for next week. If the patient cannot be weaned off the ventilator.   Interim history: No acute events overnight.  She continues to be on the vent, her breathing is comfortableThis morning she appears to have better air entry in the right lung, the patient tolerated spontaneous mode at a PEEP of 5 and pressure support of 14, Slow vent wean at this time  PULMONARY A: -Acute respiratory failure due to complete right lung atelectasis from newly diagnosed SCLC. The patient has completed her first course of chemotherapy with 3 days of cisplatin and etoposide. Her respiratory function appears to be improved and the right lung atelectasis appears to be improving. -Pleural effusion.  -Vent dependent --Review of CXR images today shows continued atelectasis.  --Pt has completed first course of chemo.  -ENT consulted for tracheostomy. Recommended holding off for now. Given elevated risk due to thrombocytopenia. P: -We will continue weaning trials, and see if the patient can be extubated over the next week before any potential tracheostomy is considered again.  CARDIOVASCULAR -A. fib with RVR. Patient was previously on a Cardizem drip, this was changed to amiodarone, the rate is now well controlled. -We'll continue amiodarone drip and changed to oral per protocol.  RENAL CKD, stable.  -Hypernatremia, appears stable, continue free water replacement and .45 saline.   GASTROINTESTINAL Continue pepcid tube feeds for Gi prophylaxis.   HEMATOLOGIC Stable.    BCx2 10/24; negative.  Bronch Cx. 05/20/15>> normal flora. AFB and fungi pending.      ENDOCRINE A:  Diabetes, hyperglycemia.  P:   continued every 4 hours sliding scale insulin. Continue Levemir.  NEUROLOGIC A: Anxiety.     MAJOR EVENTS/TEST RESULTS: Bronch 10/25 with forceps biopsy of RUL>> SCLC.   INDWELLING DEVICES:: Endotracheal tube: 10/25. CVC triple lumen: Right femoral: 10/25 Double-lumen PICC line: 10/31.  ---------------------------------------   ----------------------------------------   Name: VAIDEHI BRADDY MRN: [id number] DOB: 18-Dec-1945    ADMISSION DATE:  05/05/2015    SUBJECTIVE:   Pt currently on the ventilator, can not provide history or review of systems.  Intubated,sedated  Review of Systems:  Patient is currently on the ventilator and cannot provide a review of systems.  VITAL SIGNS: Temp:  [97 F (36.1 C)-98.5 F (36.9 C)] 97.6 F (36.4 C) (11/05 0200) Pulse Rate:  [67-106] 67 (11/05 0700) Resp:  [12-22] 14 (11/05 0700) BP: (93-142)/(56-78) 99/58 mmHg (11/05 0700) SpO2:  [90 %-95 %] 95 % (11/05 0700) FiO2 (%):  [35 %-40 %] 35 % (11/05 0810) Weight:  [260 lb 2.3 oz (118 kg)] 260 lb 2.3 oz (118 kg) (11/05 0500) HEMODYNAMICS:   VENTILATOR SETTINGS: Vent Mode:  [-] PRVC FiO2 (%):  [35 %-40 %] 35 % Set Rate:  [14 bmp] 14 bmp Vt Set:  [500 mL] 500 mL PEEP:  [5 cmH20-8 cmH20] 5 cmH20 Pressure Support:  [8 cmH20-12 cmH20] 8 cmH20 INTAKE / OUTPUT:  Intake/Output Summary (Last 24 hours) at 05/31/15 0926 Last data filed at 05/31/15 0600  Gross per 24 hour  Intake 2867.58 ml  Output   3300 ml  Net -432.42 ml    PHYSICAL EXAMINATION: Physical Examination:   VS: BP 99/58  mmHg  Pulse 67  Temp(Src) 97.6 F (36.4 C) (Axillary)  Resp 14  Ht '5\' 7"'$  (1.702 m)  Wt 260 lb 2.3 oz (118 kg)  BMI 40.73 kg/m2  SpO2 95%  General Appearance: No distress  Neuro:without focal findings, mental status normal. HEENT: PERRLA, EOM intact. Pulmonary: normal breath sounds, decreased on right side. But improved compared to  yesterday. CardiovascularNormal S1,S2.  No m/r/g.   Abdomen: Benign, Soft, non-tender. Renal:  No costovertebral tenderness  GU:  Not performed at this time. Endocrine: No evident thyromegaly. Skin:   warm, no rashes, no ecchymosis  Extremities: normal, no cyanosis, clubbing.   LABS:   LABORATORY PANEL:   CBC  Recent Labs Lab 05/31/15 0445  WBC 3.7  HGB 10.4*  HCT 32.1*  PLT 49*    Chemistries   Recent Labs Lab 05/27/15 1457  05/31/15 0445  NA 151*  < > 153*  K 3.9  < > 4.6  CL 116*  < > 118*  CO2 28  < > 30  GLUCOSE 233*  < > 124*  BUN 67*  < > 72*  CREATININE 0.97  < > 0.90  CALCIUM 8.3*  < > 8.3*  MG  --   --  1.7  PHOS  --   --  4.0  AST 35  --   --   ALT 49  --   --   ALKPHOS 43  --   --   BILITOT 0.8  --   --   < > = values in this interval not displayed.   Recent Labs Lab 05/30/15 1618 05/30/15 2023 05/30/15 2231 05/31/15 0015 05/31/15 0412 05/31/15 0646  GLUCAP 109* 96 97 115* 126* 99   No results for input(s): PHART, PCO2ART, PO2ART in the last 168 hours.  Recent Labs Lab 05/25/15 0503 05/26/15 0456 05/27/15 1457  AST 33 38 35  ALT 30 41 49  ALKPHOS 42 44 43  BILITOT 0.6 0.7 0.8  ALBUMIN 2.7* 2.8* 2.6*    Cardiac Enzymes No results for input(s): TROPONINI in the last 168 hours.  RADIOLOGY:  Dg Chest 1 View  05/30/2015  CLINICAL DATA:  Shortness of breath. EXAM: CHEST 1 VIEW COMPARISON:  05/29/2015.  CT 05/20/2015 . FINDINGS: Endotracheal tube, NG tube, right PICC line in stable position. Persistent consolidation of the right lung and right pleural effusion again noted . Heart size stable. No pneumothorax. No acute osseous abnormality. IMPRESSION: 1. Lines and tubes in stable position. 2. Persistent consolidation of the right lung with prominent right pleural effusion. No interim change from prior exam. Electronically Signed   By: [place]   On: 05/30/2015 07:15   Dg Abd 1 View  05/30/2015  CLINICAL DATA:  Lung cancer.   Distended abdomen. EXAM: ABDOMEN - 1 VIEW COMPARISON:  05/20/2015 FINDINGS: Mild gaseous distention of the stomach and transverse colon. No small bowel dilatation to suggest obstruction. No organomegaly. No free air. IMPRESSION: Mild gaseous distention of the stomach and transverse colon. This may reflect mild ileus. Electronically Signed   By: Rolm Baptise M.D.   On: 05/30/2015 16:42     I have personally obtained a history, examined the patient, evaluated Pertinent laboratory and [employer]/imaging results, and  formulated the assessment and plan   The Patient requires high complexity decision making for assessment and support, frequent evaluation and titration of therapies, application of advanced monitoring technologies and extensive interpretation of multiple databases. Critical Care Time devoted to patient care services described in  this note is 35 minutes.   Overall, patient is critically ill, prognosis is guarded.    Corrin Parker, M.D.  [facility]  Medical Director [place] Director [facility] Cardio-Pulmonary Department

## 2015-06-01 LAB — BASIC METABOLIC PANEL
ANION GAP: 2 — AB (ref 5–15)
BUN: 53 mg/dL — ABNORMAL HIGH (ref 6–20)
CO2: 29 mmol/L (ref 22–32)
Calcium: 8.2 mg/dL — ABNORMAL LOW (ref 8.9–10.3)
Chloride: 120 mmol/L — ABNORMAL HIGH (ref 101–111)
Creatinine, Ser: 0.77 mg/dL (ref 0.44–1.00)
GLUCOSE: 81 mg/dL (ref 65–99)
POTASSIUM: 4.2 mmol/L (ref 3.5–5.1)
Sodium: 151 mmol/L — ABNORMAL HIGH (ref 135–145)

## 2015-06-01 LAB — GLUCOSE, CAPILLARY
Glucose-Capillary: 88 mg/dL (ref 65–99)
Glucose-Capillary: 75 mg/dL (ref 65–99)
Glucose-Capillary: 74 mg/dL (ref 65–99)
Glucose-Capillary: 80 mg/dL (ref 65–99)
Glucose-Capillary: 75 mg/dL (ref 65–99)
Glucose-Capillary: 62 mg/dL — ABNORMAL LOW (ref 65–99)
Glucose-Capillary: 69 mg/dL (ref 65–99)
Glucose-Capillary: 105 mg/dL — ABNORMAL HIGH (ref 65–99)
Glucose-Capillary: 97 mg/dL (ref 65–99)
Glucose-Capillary: 117 mg/dL — ABNORMAL HIGH (ref 65–99)

## 2015-06-01 MED ORDER — DEXTROSE 5 % IV SOLN
INTRAVENOUS | Status: DC
Start: 2015-06-01 — End: 2015-06-05
  Administered 2015-06-01 – 2015-06-03 (×4): via INTRAVENOUS
  Administered 2015-06-04: 500 mL via INTRAVENOUS
  Administered 2015-06-05: 75 mL via INTRAVENOUS

## 2015-06-01 MED ORDER — DEXTROSE 50 % IV SOLN
25.0000 mL | Freq: Once | INTRAVENOUS | Status: AC
  Administered 2015-06-01: 25 mL via INTRAVENOUS
  Filled 2015-06-01: qty 50

## 2015-06-01 MED ORDER — FLUCONAZOLE IN SODIUM CHLORIDE 400-0.9 MG/200ML-% IV SOLN
400.0000 mg | INTRAVENOUS | Status: DC
  Administered 2015-06-01 – 2015-06-02 (×2): 400 mg via INTRAVENOUS
  Filled 2015-06-01 (×2): qty 200

## 2015-06-01 NOTE — Progress Notes (Addendum)
[place] [facility] [place] NAME: Emily Pratt    MR#:  [id number]  DATE OF BIRTH:  September 08, 1945  SUBJECTIVE:  CHIEF COMPLAINT: Pt is awake, not on sedation, on vent. REVIEW OF SYSTEMS:   Unable to obtain  DRUG ALLERGIES:   Allergies  Allergen Reactions  . Atorvastatin Other (See Comments)    Does not remember why she had Intolerance to Lipitor.  . Fentanyl Itching  . Rosuvastatin Anxiety    Chest tigtness and feeling as if she had heartburn.    VITALS:  Blood pressure 142/68, pulse 72, temperature 97.9 F (36.6 C), temperature source Oral, resp. rate 15, height '5\' 7"'$  (1.702 m), weight 118 kg (260 lb 2.3 oz), SpO2 96 %.  PHYSICAL EXAMINATION:  GENERAL:  69 y.o.-year-old patient lying in the bed, awake, on vent. Obese. EYES: Pupils equal, round, reactive to light and accommodation. No scleral icterus.  HEENT: Head atraumatic, normocephalic. ETT in place NECK:  Supple, no jugular venous distention. No thyroid enlargement, no tenderness.  LUNGS: Better air entrance on the right,  diminished breath sounds on the right , no wheezing or rhonchi , good air movements on the left , on ventilator support. CARDIOVASCULAR: S1, S2 normal. No murmurs, rubs, or gallops.  ABDOMEN: Soft, nontender, distended. Bowel sounds slugish. No organomegaly or mass.  EXTREMITIES: +1 pedal edema, no cyanosis, or clubbing.  NEUROLOGIC: CN 2-12 intact, strength 4/5,  Follows limited commands PSYCHIATRIC: awake. SKIN: Sacral decubitus ulcer    LABORATORY PANEL:   CBC  Recent Labs Lab 05/31/15 0445  WBC 3.7  HGB 10.4*  HCT 32.1*  PLT 49*   ------------------------------------------------------------------------------------------------------------------  Chemistries   Recent Labs Lab 05/27/15 1457  05/31/15 0445 06/01/15 0514  NA 151*  < > 153* 151*  K 3.9  < > 4.6 4.2  CL 116*  < > 118* 120*  CO2 28  < > 30 29  GLUCOSE 233*  < > 124* 81  BUN  67*  < > 72* 53*  CREATININE 0.97  < > 0.90 0.77  CALCIUM 8.3*  < > 8.3* 8.2*  MG  --   --  1.7  --   AST 35  --   --   --   ALT 49  --   --   --   ALKPHOS 43  --   --   --   BILITOT 0.8  --   --   --   < > = values in this interval not displayed. ------------------------------------------------------------------------------------------------------------------  Cardiac Enzymes No results for input(s): TROPONINI in the last 168 hours. ------------------------------------------------------------------------------------------------------------------  RADIOLOGY:  Dg Chest 1 View  05/31/2015  CLINICAL DATA:  Dyspnea. EXAM: CHEST 1 VIEW COMPARISON:  05/30/2015 FINDINGS: ET tube tip is above the carina. There is a nasogastric tube in place. Right arm PICC line tip is in the SVC. Loculated right pleural effusion is unchanged from previous exam. Cardiac enlargement and aortic atherosclerosis again noted. IMPRESSION: 1. No change and large loculated right pleural effusion. 2. Cardiac enlargement and aortic atherosclerosis. Electronically Signed   By: Kerby Moors M.D.   On: 05/31/2015 09:31   Dg Abd 1 View  05/30/2015  CLINICAL DATA:  Lung cancer.  Distended abdomen. EXAM: ABDOMEN - 1 VIEW COMPARISON:  05/20/2015 FINDINGS: Mild gaseous distention of the stomach and transverse colon. No small bowel dilatation to suggest obstruction. No organomegaly. No free air. IMPRESSION: Mild gaseous distention of the stomach and transverse colon. This  may reflect mild ileus. Electronically Signed   By: Rolm Baptise M.D.   On: 05/30/2015 16:42    EKG:   Orders placed or performed during the hospital encounter of 04/28/2015  . EKG 12-Lead  . EKG 12-Lead    ASSESSMENT AND PLAN:   1. Acute on chronic respiratory failure with hypoxia and Hypercapnia,  - Multifactorial due to large right-sided pleural effusion/ CHF/ COPD / mass- SCLC - Pulmonology and oncology is following, failed multiple extubation attempts.   - Continue nebulizer, steroids, antibiotics. Oncology initiated chemotherapy October 31st 2016 with cisplatin and etoposide with hopes to shrink the mass. Timing of shrinkage  is unclear,  chest x-ray does not show any improvement. Appreciate palliative care input , ENT saw patient and discussed this oncologist , no tracheostomy tube is recommended at this time but revisit issue in about 1 week. Try to wean off vent daily per Dr. Mortimer Fries.  2. Acute Congestive heart failure: Mixed systolic and diastolic  - Echo 40/81 shows ejection fraction 30 -35% with abnormal filling - Continue metoprolol per cardiology's recommendations , resumed losartan.  3. Acute on chronic kidney disease:  - Due to ATN, hypovolemia and hypotension, Cr. Is normal and BUN is improving. - Nephrology signed off. Patient was restarted on losartan.  Continue 1/2 NS iv, F/u BMP.  4. Essential hypertension:  continue metoprolol and losartan, norvasc, better blood pressure control  5. Diabetes mellitus type 2: Hold home medications Continue levemir 15 unit HS and sliding scale, blood glucose is controlled. Now holding tube feed due to mild ileus.  6.  Small cell lung cancer, likely stage IV per oncology - Status post bronchoscopy 10/25, pathology show small cell lung cancer - Long-term history of heavy smoking - pathology reports were discussed with the patient and sister in the past.   - Oncology initiated chemotherapy ,  PICC line for chemotherapy is placed 05/26/2015- Radiation oncology has been consulted and patient may benefit from radiology oncology intervention as well, since she is intubated. It might be difficult to perform. Following patient's platelet count closely with chemotherapy  7. Postobstructive pneumonia versus atelectasis - Blood cultures negative to date, respiratory, BAL, AFB normal flora - Discontinue vancomycin and Zosyn, completed 7 day Levaquin course  8. Arrhythmia, SVT versus paroxysmal atrial  fibrillation during bronchoscopy, now in sinus rhythm, continue metoprolol per cardiology's recommendations  9 thrombocytopenia, following closely, may need transfusions with chemotherapy for lung ca, It is holding the decision of doing trach.  10. Hypernatremia,  Better, continue 1/2 NS iv. F/u BMP.  11. UTI  Ur cx showed yeast,  Discontinue Rocephin IV,  Diflucan PTD.  12. Ileus   Hold feeding and f/u surgical consult- repeat xray: mild ileus.  D/w Dr. Mortimer Fries. Greater than 50% time was spent on coordination of care and face-to-face counseling.  CODE STATUS: Full code  TOTAL CRITICAL CARE TIME TAKING CARE OF THIS PATIENT: 42 minutes.    Demetrios Loll M.D on 06/01/2015 at 8:25 AM  Between 7am to 6pm - Pager - [phone] After 6pm go to [email or web address] - password [email or web address] [facility]  Office  [phone]  CC: Primary care physician; Dagoberto Ligas, MD

## 2015-06-01 NOTE — Progress Notes (Signed)
Pharmacy Consult for Electrolyte Monitoring   Allergies  Allergen Reactions  . Atorvastatin Other (See Comments)    Does not remember why she had Intolerance to Lipitor.  . Fentanyl Itching  . Rosuvastatin Anxiety    Chest tigtness and feeling as if she had heartburn.    Patient Measurements: Height: '5\' 7"'$  (170.2 cm) Weight: 260 lb 2.3 oz (118 kg) IBW/kg (Calculated) : 61.6   Vital Signs: BP: 127/63 mmHg (11/06 0600) Pulse Rate: 74 (11/06 0600) Intake/Output from previous day: 11/05 0701 - 11/06 0700 In: 875 [I.V.:825; IV Piggyback:50] Out: 6599 [Urine:1350; Stool:1] Intake/Output from this shift: Total I/O In: -  Out: 1201 [Urine:1200; Stool:1]  Labs:  Recent Labs  05/30/15 0415 05/31/15 0445  WBC 6.5 3.7  HGB 11.9* 10.4*  HCT 37.9 32.1*  PLT 64* 49*     Recent Labs  05/30/15 0415 05/31/15 0445 06/01/15 0514  NA 152* 153* 151*  K 4.0 4.6 4.2  CL 118* 118* 120*  CO2 '31 30 29  '$ GLUCOSE 95 124* 81  BUN 77* 72* 53*  CREATININE 0.88 0.90 0.77  CALCIUM 8.1* 8.3* 8.2*  MG  --  1.7  --   PHOS  --  4.0  --    Estimated Creatinine Clearance: 90.7 mL/min (by C-G formula based on Cr of 0.77).    Recent Labs  06/01/15 0205 06/01/15 0351 06/01/15 0556  GLUCAP 88 75 74    Medical History: Past Medical History  Diagnosis Date  . Anxiety   . Arthritis   . COPD (chronic obstructive pulmonary disease) ([place])   . CHF (congestive heart failure) ([place])   . Hyperlipidemia   . Hypertension   . Diabetes mellitus without complication ([place])   . Chronic kidney disease   . Neuromuscular disorder ([facility])     Medications:  Scheduled:  . allopurinol  100 mg Oral Daily  . amLODipine  5 mg Oral Daily  . antiseptic oral rinse  7 mL Mouth Rinse QID  . budesonide (PULMICORT) nebulizer solution  0.5 mg Nebulization BID  . cefTRIAXone (ROCEPHIN)  IV  1 g Intravenous Q24H  . chlorhexidine gluconate  15 mL Mouth Rinse BID  . famotidine  20 mg Per Tube BID  . insulin  aspart  0-15 Units Subcutaneous 6 times per day  . insulin detemir  15 Units Subcutaneous Q24H  . ipratropium-albuterol  3 mL Nebulization Q6H  . losartan  100 mg Oral Daily  . metoprolol  100 mg Per Tube BID  . pregabalin  50 mg Per Tube TID   Infusions:  . sodium chloride 75 mL/hr at 06/01/15 0600  . dexmedetomidine 0.6 mcg/kg/hr (06/01/15 0600)    Assessment: Patient started on 1/2NS @ 85m on 11/2. Patient's sodium remains elevated but stable.   Plan:  No additional supplementation warranted at this time.  Will f/u AM labs.   Pharmacy will continue to monitor labs and replace electrolytes as needed.   11/5 AM electrolyte panel WNL except for Na+ still elevated. No indication for replacement. Will f/u BMP in AM.  11/6 AM electrolyte panel WNL except for Na+ still elevated. No indication for replacement. Will f/u BMP, Mg, and PO4 in AM   MSim Boast PharmD, BCPS  06/01/2015

## 2015-06-01 NOTE — Progress Notes (Signed)
Pharmacy Consult for Fluconazole  Indication: UTI  Allergies  Allergen Reactions  . Atorvastatin Other (See Comments)    Does not remember why she had Intolerance to Lipitor.  . Fentanyl Itching  . Rosuvastatin Anxiety    Chest tigtness and feeling as if she had heartburn.    Patient Measurements: Height: '5\' 7"'$  (170.2 cm) Weight: 260 lb 2.3 oz (118 kg) IBW/kg (Calculated) : 61.6  Vital Signs: Temp: 97.9 F (36.6 C) (11/06 0800) Temp Source: Oral (11/06 0800) BP: 142/68 mmHg (11/06 0800) Pulse Rate: 72 (11/06 0800) Intake/Output from previous day: 11/05 0701 - 11/06 0700 In: 1269.7 [I.V.:1219.7; IV Piggyback:50] Out: 4401 [UUVOZ:3664; Stool:1] Intake/Output from this shift: Total I/O In: 20.8 [I.V.:20.8] Out: 1075 [Urine:1075]  Labs:  Recent Labs  05/30/15 0415 05/31/15 0445 06/01/15 0514  WBC 6.5 3.7  --   HGB 11.9* 10.4*  --   PLT 64* 49*  --   CREATININE 0.88 0.90 0.77   Estimated Creatinine Clearance: 90.7 mL/min (by C-G formula based on Cr of 0.77). No results for input(s): VANCOTROUGH, VANCOPEAK, VANCORANDOM, GENTTROUGH, GENTPEAK, GENTRANDOM, TOBRATROUGH, TOBRAPEAK, TOBRARND, AMIKACINPEAK, AMIKACINTROU, AMIKACIN in the last 72 hours.   Microbiology: Recent Results (from the past 720 hour(s))  MRSA PCR Screening     Status: None   Collection Time: 05/18/2015 12:28 AM  Result Value Ref Range Status   MRSA by PCR NEGATIVE NEGATIVE Final    Comment:        The GeneXpert MRSA Assay (FDA approved for NASAL specimens only), is one component of a comprehensive MRSA colonization surveillance program. It is not intended to diagnose MRSA infection nor to guide or monitor treatment for MRSA infections.   Culture, blood (routine x 2)     Status: None   Collection Time: 05/14/2015  1:13 AM  Result Value Ref Range Status   Specimen Description BLOOD RIGHT HAND  Final   Special Requests BOTTLES DRAWN AEROBIC AND ANAEROBIC 3CC  Final   Culture NO GROWTH 5 DAYS   Final   Report Status 05/24/2015 FINAL  Final  Culture, blood (routine x 2)     Status: None   Collection Time: 05/25/2015  1:13 AM  Result Value Ref Range Status   Specimen Description BLOOD RIGHT ASSIST CONTROL  Final   Special Requests BOTTLES DRAWN AEROBIC AND ANAEROBIC 4CC  Final   Culture NO GROWTH 5 DAYS  Final   Report Status 05/24/2015 FINAL  Final  Culture, expectorated sputum-assessment     Status: None   Collection Time: 05/20/15  2:00 PM  Result Value Ref Range Status   Specimen Description EXPECTORATED SPUTUM  Final   Special Requests NONE  Final   Sputum evaluation THIS SPECIMEN IS ACCEPTABLE FOR SPUTUM CULTURE  Final   Report Status 05/20/2015 FINAL  Final  Culture, respiratory (NON-Expectorated)     Status: None   Collection Time: 05/20/15  2:00 PM  Result Value Ref Range Status   Specimen Description EXPECTORATED SPUTUM  Final   Special Requests NONE Reflexed from [id number]  Final   Gram Stain   Final    FEW WBC SEEN FEW GRAM POSITIVE RODS FAIR SPECIMEN - 70-80% WBCS    Culture APPEARS TO BE NORMAL FLORA  Final   Report Status 05/22/2015 FINAL  Final  Culture, bal-quantitative     Status: None   Collection Time: 05/20/15  5:05 PM  Result Value Ref Range Status   Specimen Description BRONCHIAL ALVEOLAR LAVAGE  Final   Special Requests Normal  Final   Gram Stain   Final    MODERATE WBC SEEN RARE GRAM POSITIVE RODS RARE GRAM POSITIVE COCCI IN CHAINS GOOD SPECIMEN - 80-90% WBCS    Culture APPEARS TO BE NORMAL FLORA  Final   Report Status 05/22/2015 FINAL  Final  Fungus Culture with Smear     Status: None (Preliminary result)   Collection Time: 05/20/15  5:05 PM  Result Value Ref Range Status   Specimen Description SPUTUM  Final   Special Requests Normal  Final   Culture CANDIDA DUBLINIENSIS  Final   Report Status PENDING  Incomplete  C difficile quick scan w PCR reflex     Status: None   Collection Time: 05/29/15  4:50 PM  Result Value Ref Range Status   C  Diff antigen NEGATIVE NEGATIVE Final   C Diff toxin NEGATIVE NEGATIVE Final   C Diff interpretation Negative for C. difficile  Final  Urine culture     Status: None (Preliminary result)   Collection Time: 05/29/15  6:19 PM  Result Value Ref Range Status   Specimen Description URINE, RANDOM  Final   Special Requests NONE  Final   Culture   Final    >=100,000 COLONIES/mL YEAST IDENTIFICATION TO FOLLOW    Report Status PENDING  Incomplete    Medical History: Past Medical History  Diagnosis Date  . Anxiety   . Arthritis   . COPD (chronic obstructive pulmonary disease) ([place])   . CHF (congestive heart failure) ([place])   . Hyperlipidemia   . Hypertension   . Diabetes mellitus without complication ([place])   . Chronic kidney disease   . Neuromuscular disorder (HCC)     Medications:  Scheduled:  . allopurinol  100 mg Oral Daily  . amLODipine  5 mg Oral Daily  . antiseptic oral rinse  7 mL Mouth Rinse QID  . budesonide (PULMICORT) nebulizer solution  0.5 mg Nebulization BID  . chlorhexidine gluconate  15 mL Mouth Rinse BID  . famotidine  20 mg Per Tube BID  . fluconazole (DIFLUCAN) IV  400 mg Intravenous Q24H  . insulin aspart  0-15 Units Subcutaneous 6 times per day  . insulin detemir  15 Units Subcutaneous Q24H  . ipratropium-albuterol  3 mL Nebulization Q6H  . losartan  100 mg Oral Daily  . metoprolol  100 mg Per Tube BID  . pregabalin  50 mg Per Tube TID   Infusions:  . sodium chloride 75 mL/hr at 06/01/15 0600  . dexmedetomidine 0.6 mcg/kg/hr (06/01/15 1194)   Assessment: 69 y/o F with urine culture growing yeast.   Goal of Therapy:  Resolution of infection  Plan:  Agree with fluconazole 400 mg iv daily as ordered. Will f/u culture results and renal function.   Ulice Dash D 06/01/2015,8:40 AM

## 2015-06-01 NOTE — Progress Notes (Addendum)
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 The patient is vent dependent. Due to the right lung atelectasis. She has completed course of chemotherapy, trach is being considered for next week, If the patient cannot be weaned off the ventilator.   Interim history: No acute events overnight.  She continues to be on the vent, her breathing is comfortable  she did NOT tolerate PS mode this AM, increased WOB and hypoxia Placed back on PRVC mode Slow vent wean at this time-will likely need trach  PULMONARY A: -Acute respiratory failure due to complete right lung atelectasis from newly diagnosed SCLC. The patient has completed her first course of chemotherapy with 3 days of cisplatin and etoposide.  -Pleural effusion.  -Vent dependent --Review of CXR images today shows continued atelectasis.  --Pt has completed first course of chemo.  -ENT consulted for tracheostomy. Recommended holding off for now. Given elevated risk due to thrombocytopenia. P: -We will continue weaning trials, and see if the patient can be extubated over the next week before any potential tracheostomy is considered again. At this time, patient will likely need trach for survival  CARDIOVASCULAR -A. fib with RVR. Patient was previously on a Cardizem drip, this was changed to amiodarone, the rate is now well controlled. -We'll continue amiodarone drip and changed to oral per protocol.  RENAL CKD, stable.  -Hypernatremia, appears stable, continue free water replacement and .45 saline.   GASTROINTESTINAL Continue pepcid tube feeds for Gi prophylaxis.   HEMATOLOGIC Stable.   UROLOGY -foley catheter replaced, evidence of yeast infection Will start diflican  BCx2 75/64; negative.  Bronch Cx. 05/20/15>> normal flora. AFB and fungi  pending.    ENDOCRINE A:  Diabetes, hyperglycemia.  P:   continued every 4 hours sliding scale insulin. Continue Levemir.  NEUROLOGIC A: Anxiety.  -sedation as needed, on precedex   MAJOR EVENTS/TEST RESULTS: Bronch 10/25 with forceps biopsy of RUL>> SCLC.   INDWELLING DEVICES:: Endotracheal tube: 10/25. CVC triple lumen: Right femoral: 10/25 Double-lumen PICC line: 10/31.  ---------------------------------------   ----------------------------------------   Name: Emily Pratt MRN: [id number] DOB: 11-20-1945    ADMISSION DATE:  05/03/2015    SUBJECTIVE:   Pt currently on the ventilator, can not provide history or review of systems.  Intubated,sedated. Failed SAT/SBt due to resp muscle fatigue  Review of Systems:  Patient is currently on the ventilator and cannot provide a review of systems.  VITAL SIGNS: Temp:  [97.9 F (36.6 C)] 97.9 F (36.6 C) (11/06 0800) Pulse Rate:  [69-110] 72 (11/06 0800) Resp:  [12-22] 15 (11/06 0800) BP: (90-166)/(54-91) 142/68 mmHg (11/06 0800) SpO2:  [92 %-99 %] 96 % (11/06 0800) FiO2 (%):  [35 %] 35 % (11/06 0418) Weight:  [260 lb 2.3 oz (118 kg)] 260 lb 2.3 oz (118 kg) (11/06 0500) HEMODYNAMICS:   VENTILATOR SETTINGS: Vent Mode:  [-] PRVC FiO2 (%):  [35 %] 35 % Set Rate:  [14 bmp] 14 bmp Vt Set:  [500 mL] 500 mL PEEP:  [5 cmH20] 5 cmH20 Plateau Pressure:  [21 cmH20] 21 cmH20 INTAKE / OUTPUT:  Intake/Output Summary (Last 24 hours) at 06/01/15 0832 Last data filed at 06/01/15 0819  Gross per 24 hour  Intake 1290.53 ml  Output   2551 ml  Net -1260.47 ml    PHYSICAL EXAMINATION: Physical Examination:  VS: BP 142/68 mmHg  Pulse 72  Temp(Src) 97.9 F (36.6 C) (Oral)  Resp 15  Ht '5\' 7"'$  (1.702 m)  Wt 260 lb 2.3 oz (118 kg)  BMI 40.73 kg/m2  SpO2 96%  General Appearance: No distress  Neuro:without focal findings, mental status normal. HEENT: PERRLA, EOM intact. Pulmonary: normal breath sounds, decreased on  right side. But improved compared to yesterday. CardiovascularNormal S1,S2.  No m/r/g.   Abdomen: Benign, Soft, non-tender. Renal:  No costovertebral tenderness  GU:  Not performed at this time. Endocrine: No evident thyromegaly. Skin:   warm, no rashes, no ecchymosis  Extremities: normal, no cyanosis, clubbing.   LABS:   LABORATORY PANEL:   CBC  Recent Labs Lab 05/31/15 0445  WBC 3.7  HGB 10.4*  HCT 32.1*  PLT 49*    Chemistries   Recent Labs Lab 05/27/15 1457  05/31/15 0445 06/01/15 0514  NA 151*  < > 153* 151*  K 3.9  < > 4.6 4.2  CL 116*  < > 118* 120*  CO2 28  < > 30 29  GLUCOSE 233*  < > 124* 81  BUN 67*  < > 72* 53*  CREATININE 0.97  < > 0.90 0.77  CALCIUM 8.3*  < > 8.3* 8.2*  MG  --   --  1.7  --   PHOS  --   --  4.0  --   AST 35  --   --   --   ALT 49  --   --   --   ALKPHOS 43  --   --   --   BILITOT 0.8  --   --   --   < > = values in this interval not displayed.   Recent Labs Lab 05/31/15 2000 05/31/15 2156 05/31/15 2343 06/01/15 0205 06/01/15 0351 06/01/15 0556  GLUCAP 77 78 67 88 75 74   No results for input(s): PHART, PCO2ART, PO2ART in the last 168 hours.  Recent Labs Lab 05/26/15 0456 05/27/15 1457  AST 38 35  ALT 41 49  ALKPHOS 44 43  BILITOT 0.7 0.8  ALBUMIN 2.8* 2.6*    Cardiac Enzymes No results for input(s): TROPONINI in the last 168 hours.  RADIOLOGY:  Dg Chest 1 View  05/31/2015  CLINICAL DATA:  Dyspnea. EXAM: CHEST 1 VIEW COMPARISON:  05/30/2015 FINDINGS: ET tube tip is above the carina. There is a nasogastric tube in place. Right arm PICC line tip is in the SVC. Loculated right pleural effusion is unchanged from previous exam. Cardiac enlargement and aortic atherosclerosis again noted. IMPRESSION: 1. No change and large loculated right pleural effusion. 2. Cardiac enlargement and aortic atherosclerosis. Electronically Signed   By: Kerby Moors M.D.   On: 05/31/2015 09:31   Dg Abd 1 View  05/30/2015  CLINICAL  DATA:  Lung cancer.  Distended abdomen. EXAM: ABDOMEN - 1 VIEW COMPARISON:  05/20/2015 FINDINGS: Mild gaseous distention of the stomach and transverse colon. No small bowel dilatation to suggest obstruction. No organomegaly. No free air. IMPRESSION: Mild gaseous distention of the stomach and transverse colon. This may reflect mild ileus. Electronically Signed   By: Rolm Baptise M.D.   On: 05/30/2015 16:42     I have personally obtained a history, examined the patient, evaluated Pertinent laboratory and [employer]/imaging results, and  formulated the assessment and plan   The Patient requires high complexity decision making for assessment and support, frequent evaluation and titration of therapies, application of advanced monitoring technologies  and extensive interpretation of multiple databases. Critical Care Time devoted to patient care services described in this note is 35 minutes.   Overall, patient is critically ill, prognosis is guarded.    Corrin Parker, M.D.  [facility]  Medical Director [place] Director [facility] Cardio-Pulmonary Department

## 2015-06-02 ENCOUNTER — Inpatient Hospital Stay: Payer: Medicare Other

## 2015-06-02 DIAGNOSIS — E870 Hyperosmolality and hypernatremia: Secondary | ICD-10-CM

## 2015-06-02 LAB — BASIC METABOLIC PANEL
ANION GAP: 3 — AB (ref 5–15)
ANION GAP: 7 (ref 5–15)
BUN: 40 mg/dL — AB (ref 6–20)
BUN: 36 mg/dL — ABNORMAL HIGH (ref 6–20)
CALCIUM: 8.1 mg/dL — AB (ref 8.9–10.3)
CO2: 31 mmol/L (ref 22–32)
CO2: 30 mmol/L (ref 22–32)
CREATININE: 0.79 mg/dL (ref 0.44–1.00)
Calcium: 8.2 mg/dL — ABNORMAL LOW (ref 8.9–10.3)
Chloride: 119 mmol/L — ABNORMAL HIGH (ref 101–111)
Chloride: 117 mmol/L — ABNORMAL HIGH (ref 101–111)
Creatinine, Ser: 0.75 mg/dL (ref 0.44–1.00)
GFR calc Af Amer: >60 mL/min (ref >60)
GFR calc Af Amer: >60 mL/min (ref >60)
GFR calc non Af Amer: >60 mL/min (ref >60)
GLUCOSE: 133 mg/dL — AB (ref 65–99)
GLUCOSE: 149 mg/dL — AB (ref 65–99)
POTASSIUM: 4 mmol/L (ref 3.5–5.1)
Potassium: 4 mmol/L (ref 3.5–5.1)
Sodium: 153 mmol/L — ABNORMAL HIGH (ref 135–145)
Sodium: 154 mmol/L — ABNORMAL HIGH (ref 135–145)

## 2015-06-02 LAB — GLUCOSE, CAPILLARY
Glucose-Capillary: 106 mg/dL — ABNORMAL HIGH (ref 65–99)
Glucose-Capillary: 112 mg/dL — ABNORMAL HIGH (ref 65–99)
Glucose-Capillary: 115 mg/dL — ABNORMAL HIGH (ref 65–99)
Glucose-Capillary: 117 mg/dL — ABNORMAL HIGH (ref 65–99)
Glucose-Capillary: 123 mg/dL — ABNORMAL HIGH (ref 65–99)
Glucose-Capillary: 127 mg/dL — ABNORMAL HIGH (ref 65–99)
Glucose-Capillary: 130 mg/dL — ABNORMAL HIGH (ref 65–99)
Glucose-Capillary: 133 mg/dL — ABNORMAL HIGH (ref 65–99)

## 2015-06-02 LAB — MAGNESIUM: Magnesium: 1.5 mg/dL — ABNORMAL LOW (ref 1.7–2.4)

## 2015-06-02 LAB — PHOSPHORUS: Phosphorus: 3.1 mg/dL (ref 2.5–4.6)

## 2015-06-02 MED ORDER — FLUCONAZOLE 100 MG PO TABS
200.0000 mg | ORAL_TABLET | Freq: Every day | ORAL | Status: DC
  Administered 2015-06-03 – 2015-06-07 (×5): 200 mg via ORAL
  Filled 2015-06-02 (×5): qty 2

## 2015-06-02 MED ORDER — MAGNESIUM SULFATE 2 GM/50ML IV SOLN
2.0000 g | Freq: Once | INTRAVENOUS | Status: AC
  Administered 2015-06-02: 2 g via INTRAVENOUS
  Filled 2015-06-02: qty 50

## 2015-06-02 NOTE — Progress Notes (Addendum)
Pharmacy Consult for Electrolyte Monitoring   Allergies  Allergen Reactions  . Atorvastatin Other (See Comments)    Does not remember why she had Intolerance to Lipitor.  . Fentanyl Itching  . Rosuvastatin Anxiety    Chest tigtness and feeling as if she had heartburn.    Patient Measurements: Height: '5\' 7"'$  (170.2 cm) Weight: 252 lb 10.4 oz (114.6 kg) IBW/kg (Calculated) : 61.6   Vital Signs: BP: 110/62 mmHg (11/07 0500) Pulse Rate: 74 (11/07 0500) Intake/Output from previous day: 11/06 0701 - 11/07 0700 In: 1436.3 [I.V.:1216.3; IV Piggyback:200] Out: 7829 [Urine:4625; Emesis/NG output:500] Intake/Output from this shift: Total I/O In: 450 [I.V.:450] Out: 1100 [Urine:1100]  Labs:  Recent Labs  05/31/15 0445  WBC 3.7  HGB 10.4*  HCT 32.1*  PLT 49*     Recent Labs  05/31/15 0445 06/01/15 0514 06/02/15 0524  NA 153* 151* 153*  K 4.6 4.2 4.0  CL 118* 120* 119*  CO2 '30 29 31  '$ GLUCOSE 124* 81 133*  BUN 72* 53* 40*  CREATININE 0.90 0.77 0.79  CALCIUM 8.3* 8.2* 8.1*  MG 1.7  --  1.5*  PHOS 4.0  --  3.1   Estimated Creatinine Clearance: 89.2 mL/min (by C-G formula based on Cr of 0.79).    Recent Labs  06/02/15 0016 06/02/15 0211 06/02/15 0412  GLUCAP 123* 115* 106*    Medical History: Past Medical History  Diagnosis Date  . Anxiety   . Arthritis   . COPD (chronic obstructive pulmonary disease) ([place])   . CHF (congestive heart failure) ([place])   . Hyperlipidemia   . Hypertension   . Diabetes mellitus without complication ([place])   . Chronic kidney disease   . Neuromuscular disorder (HCC)     Medications:  Scheduled:  . allopurinol  100 mg Oral Daily  . amLODipine  5 mg Oral Daily  . antiseptic oral rinse  7 mL Mouth Rinse QID  . budesonide (PULMICORT) nebulizer solution  0.5 mg Nebulization BID  . chlorhexidine gluconate  15 mL Mouth Rinse BID  . famotidine  20 mg Per Tube BID  . fluconazole (DIFLUCAN) IV  400 mg Intravenous Q24H  . insulin  aspart  0-15 Units Subcutaneous 6 times per day  . insulin detemir  15 Units Subcutaneous Q24H  . ipratropium-albuterol  3 mL Nebulization Q6H  . losartan  100 mg Oral Daily  . metoprolol  100 mg Per Tube BID  . pregabalin  50 mg Per Tube TID   Infusions:  . dexmedetomidine 0.6 mcg/kg/hr (06/02/15 0500)  . dextrose 50 mL/hr at 06/01/15 1439    Assessment: Patient started on 1/2NS @ 76m on 11/2. Patient's sodium remains elevated but stable.   Plan:  No additional supplementation warranted at this time.  Will f/u AM labs.   Pharmacy will continue to monitor labs and replace electrolytes as needed.   11/5 AM electrolyte panel WNL except for Na+ still elevated. No indication for replacement. Will f/u BMP in AM.  11/6 AM electrolyte panel WNL except for Na+ still elevated. No indication for replacement. Will f/u BMP, Mg, and PO4 in AM  11/7 Electrolyte panel WNL execpt. Na+ still a bit elevated and Mg 1.5 Will order magnesium sulfate 2 grams IV x1. Recheck BMP and Mg in AM.   MSim Boast PharmD, BCPS  06/02/2015

## 2015-06-02 NOTE — Progress Notes (Signed)
[place]  [phone]) [phone] [phone]) [phone]  ID: NORVA BOWE OB: January 07, 1946  MR#: [id number]  YKD#:[id number]  Patient Care Team: Dagoberto Ligas, MD as PCP - General (Internal Medicine)  CHIEF COMPLAINT:  Chief Complaint  Patient presents with  . Respiratory Distress    INTERVAL HISTORY: Patient currently sleeping. She remains intubated, but appears comfortable. She has completed cycle 1 of Cisplatin and Etoposide last week.   REVIEW OF SYSTEMS:   Review of Systems  Unable to perform ROS: intubated    PAST MEDICAL HISTORY: Past Medical History  Diagnosis Date  . Anxiety   . Arthritis   . COPD (chronic obstructive pulmonary disease) ([place])   . CHF (congestive heart failure) ([place])   . Hyperlipidemia   . Hypertension   . Diabetes mellitus without complication ([place])   . Chronic kidney disease   . Neuromuscular disorder ([place])     PAST SURGICAL HISTORY: Past Surgical History  Procedure Laterality Date  . Joint replacement Bilateral 2006    both knees  . Ankle surgery      FAMILY HISTORY Family History  Problem Relation Age of Onset  . Diabetes Mother   . Cancer Father   . Diabetes Sister   . Stroke Sister        ADVANCED DIRECTIVES:    HEALTH MAINTENANCE: Social History  Substance Use Topics  . Smoking status: Current Every Day Smoker    Types: Cigarettes  . Smokeless tobacco: None     Comment: 0.5 pack /day  . Alcohol Use: No     Colonoscopy:  PAP:  Bone density:  Lipid panel:  Allergies  Allergen Reactions  . Atorvastatin Other (See Comments)    Does not remember why she had Intolerance to Lipitor.  . Fentanyl Itching  . Rosuvastatin Anxiety    Chest tigtness and feeling as if she had heartburn.    Current Facility-Administered Medications  Medication Dose Route Frequency Provider Last Rate Last Dose  . acetaminophen (TYLENOL) tablet 650 mg  650 mg Oral Q6H PRN Harrie Foreman, MD   650 mg at 05/29/15  3976   Or  . acetaminophen (TYLENOL) suppository 650 mg  650 mg Rectal Q6H PRN Harrie Foreman, MD      . allopurinol (ZYLOPRIM) tablet 100 mg  100 mg Oral Daily Laverle Hobby, MD   100 mg at 06/02/15 1025  . amLODipine (NORVASC) tablet 5 mg  5 mg Oral Daily Theodoro Grist, MD   5 mg at 06/02/15 1000  . antiseptic oral rinse solution (CORINZ)  7 mL Mouth Rinse QID Vishal Mungal, MD   7 mL at 06/02/15 0400  . budesonide (PULMICORT) nebulizer solution 0.5 mg  0.5 mg Nebulization BID Flora Lipps, MD   0.5 mg at 06/02/15 0853  . chlorhexidine gluconate (PERIDEX) 0.12 % solution 15 mL  15 mL Mouth Rinse BID Vishal Mungal, MD   15 mL at 06/02/15 0751  . dexmedetomidine (PRECEDEX) 400 MCG/100ML (4 mcg/mL) infusion  0-1.2 mcg/kg/hr Intravenous Continuous Flora Lipps, MD 15.8 mL/hr at 06/02/15 0751 0.6 mcg/kg/hr at 06/02/15 0751  . dextrose 5 % solution   Intravenous Continuous Vishal Mungal, MD 50 mL/hr at 06/02/15 1027    . famotidine (PEPCID) tablet 20 mg  20 mg Per Tube BID Theodoro Grist, MD   20 mg at 06/02/15 1026  . fentaNYL (SUBLIMAZE) injection 25-100 mcg  25-100 mcg Intravenous Q2H PRN Wilhelmina Mcardle, MD   100 mcg at 06/02/15 0905  . [  START ON 06/03/2015] fluconazole (DIFLUCAN) tablet 200 mg  200 mg Oral Daily Vishal Mungal, MD      . insulin aspart (novoLOG) injection 0-15 Units  0-15 Units Subcutaneous 6 times per day Wilhelmina Mcardle, MD   2 Units at 06/02/15 0024  . insulin detemir (LEVEMIR) injection 15 Units  15 Units Subcutaneous Q24H Laverle Hobby, MD   15 Units at 06/02/15 1026  . ipratropium-albuterol (DUONEB) 0.5-2.5 (3) MG/3ML nebulizer solution 3 mL  3 mL Nebulization Q6H Vishal Mungal, MD   3 mL at 06/02/15 0846  . losartan (COZAAR) tablet 100 mg  100 mg Oral Daily Theodoro Grist, MD   100 mg at 06/02/15 1024  . metoprolol (LOPRESSOR) tablet 100 mg  100 mg Per Tube BID Wilhelmina Mcardle, MD   100 mg at 06/02/15 1024  . midazolam (VERSED) injection 1-2 mg  1-2 mg  Intravenous Q2H PRN Wilhelmina Mcardle, MD   2 mg at 06/02/15 0302  . pregabalin (LYRICA) capsule 50 mg  50 mg Per Tube TID Wilhelmina Mcardle, MD   50 mg at 06/02/15 1024    OBJECTIVE: Filed Vitals:   06/02/15 1224  BP:   Pulse: 76  Temp:   Resp: 18     Body mass index is 39.56 kg/(m^2).    ECOG FS:4 - Bedbound  General:  intubated, but alert Eyes: Pink conjunctiva, anicteric sclera. HEENT:  ET tube in place. Lungs: Clear to auscultation bilaterally. Heart: Regular rate and rhythm. No rubs, murmurs, or gallops. Abdomen: Soft, nontender, nondistended. No organomegaly noted, normoactive bowel sounds. Musculoskeletal: No edema, cyanosis, or clubbing. Neuro: Lethargic.  Skin: No rashes or petechiae noted.   LAB RESULTS:  Lab Results  Component Value Date   NA 153* 06/02/2015   K 4.0 06/02/2015   CL 119* 06/02/2015   CO2 31 06/02/2015   GLUCOSE 133* 06/02/2015   BUN 40* 06/02/2015   CREATININE 0.79 06/02/2015   CALCIUM 8.1* 06/02/2015   PROT 5.3* 05/27/2015   ALBUMIN 2.6* 05/27/2015   AST 35 05/27/2015   ALT 49 05/27/2015   ALKPHOS 43 05/27/2015   BILITOT 0.8 05/27/2015   GFRNONAA >60 06/02/2015   GFRAA >60 06/02/2015    Lab Results  Component Value Date   WBC 3.7 05/31/2015   NEUTROABS 4.1 04/24/2015   HGB 10.4* 05/31/2015   HCT 32.1* 05/31/2015   MCV 92.6 05/31/2015   PLT 49* 05/31/2015     STUDIES: Dg Chest 1 View  05/31/2015  CLINICAL DATA:  Dyspnea. EXAM: CHEST 1 VIEW COMPARISON:  05/30/2015 FINDINGS: ET tube tip is above the carina. There is a nasogastric tube in place. Right arm PICC line tip is in the SVC. Loculated right pleural effusion is unchanged from previous exam. Cardiac enlargement and aortic atherosclerosis again noted. IMPRESSION: 1. No change and large loculated right pleural effusion. 2. Cardiac enlargement and aortic atherosclerosis. Electronically Signed   By: Kerby Moors M.D.   On: 05/31/2015 09:31   Dg Chest 1 View  05/30/2015  CLINICAL  DATA:  Shortness of breath. EXAM: CHEST 1 VIEW COMPARISON:  05/29/2015.  CT 05/20/2015 . FINDINGS: Endotracheal tube, NG tube, right PICC line in stable position. Persistent consolidation of the right lung and right pleural effusion again noted . Heart size stable. No pneumothorax. No acute osseous abnormality. IMPRESSION: 1. Lines and tubes in stable position. 2. Persistent consolidation of the right lung with prominent right pleural effusion. No interim change from prior exam. Electronically Signed  By: [place]   On: 05/30/2015 07:15   Dg Chest 1 View  05/29/2015  CLINICAL DATA:  Dyspnea, respiratory failure, COPD, lung mass EXAM: CHEST 1 VIEW COMPARISON:  Portable chest x-ray of May 28, 2015 FINDINGS: There is persistent near total atelectasis of the right lung with some aeration noted in the lower hemi thorax. The left lung is clear. There is no significant mediastinal shift. The cardiac silhouette is normal in size. The endotracheal tube tip lies 4.6 cm above the carina. The esophagogastric tube tip projects below the inferior margin of the image. The right-sided PICC line tip projects over the midportion of the SVC. IMPRESSION: Persistent consolidation of the mid and upper right lung with small amount of residual aerated right lower lung. The left lung is clear. The support tubes are in reasonable position. Electronically Signed   By: David  [place] M.D.   On: 05/29/2015 07:20   Dg Chest 1 View  05/28/2015  CLINICAL DATA:  Hypoxia EXAM: CHEST 1 VIEW COMPARISON:  May 26, 2015 FINDINGS: Endotracheal tube tip is 4.6 cm above the carina. Nasogastric tube tip and side port below the diaphragm. Central catheter tip is in the superior cava. No pneumothorax. There is complete opacification of the right hemithorax with volume loss and probable effusion. There is elevation of the right hemidiaphragm. The left lung is clear. The heart size is within normal limits. Pulmonary vascularity on the left  is within normal limits. Pulmonary vascularity on the right is obscured. There is atherosclerotic change throughout the aorta. IMPRESSION: Tube and catheter positions as described without pneumothorax. Complete opacification of the right hemithorax remains with evidence of volume loss and probable pleural effusion. Left lung clear. No change in cardiac silhouette. Electronically Signed   By: Lowella Grip III M.D.   On: 05/28/2015 07:18   Dg Chest 1 View  05/26/2015  CLINICAL DATA:  Wheezing and shortness of breath EXAM: CHEST 1 VIEW COMPARISON:  Portable chest x-ray of today's date FINDINGS: There is persistent 0 opacification of the mid and upper thirds of the right hemi thorax. Only a small amount of aerated lung is visible in the lower hemi thorax. Pleural fluid on the right is suspected. The cardiac silhouette is mildly enlarged. The left lung is well-expanded. There is no focal infiltrate or pleural effusion on the left. The endotracheal tube tip projects 4.2 cm above the carina. The PICC line tip on the left overlies the junction of the proximal and midportions of the SVC. IMPRESSION: Fairly stable appearance of the chest since the previous study with opacification of the upper 2/3 of the right hemithorax consistent with atelectasis-pneumonia and left pleural effusion. The left lung remains clear. Electronically Signed   By: David  [place] M.D.   On: 05/26/2015 13:51   Dg Abd 1 View  06/02/2015  CLINICAL DATA:  Orogastric tube placement. EXAM: ABDOMEN - 1 VIEW COMPARISON:  Earlier today at 0800 hours. FINDINGS: Motion and patient body habitus degradation. Nasogastric tube terminates at the body of the stomach. The side-port may be above the gastroesophageal junction. No gross free intraperitoneal air. Partial right hemi thorax opacification, incompletely imaged. IMPRESSION: Degraded exam, as detailed above. Nasogastric terminating at the body of the stomach. Electronically Signed   By: Abigail Miyamoto  M.D.   On: 06/02/2015 09:28   Dg Abd 1 View  06/02/2015  CLINICAL DATA:  NG tube placement . EXAM: ABDOMEN - 1 VIEW COMPARISON:  05/30/2015 . FINDINGS: NG tube tip noted at  the level of the gastroesophageal junction. More distal placement should be considered. Persistent bowel distention. No free air . IMPRESSION: NG tube tip noted at the level of the gastroesophageal junction. More distal placement should be considered.These results will be called to the ordering clinician or representative by the Radiologist Assistant, and communication documented in the PACS or zVision Dashboard. Electronically Signed   By: Marcello Moores  Register   On: 06/02/2015 08:19   Dg Abd 1 View  05/30/2015  CLINICAL DATA:  Lung cancer.  Distended abdomen. EXAM: ABDOMEN - 1 VIEW COMPARISON:  05/20/2015 FINDINGS: Mild gaseous distention of the stomach and transverse colon. No small bowel dilatation to suggest obstruction. No organomegaly. No free air. IMPRESSION: Mild gaseous distention of the stomach and transverse colon. This may reflect mild ileus. Electronically Signed   By: Rolm Baptise M.D.   On: 05/30/2015 16:42   Ct Chest Wo Contrast  05/20/2015  CLINICAL DATA:  69 year old who presented with acute hypercapnic respiratory failure, current history of COPD and CHF, stage 3 chronic kidney disease, with an enlarging right pleural effusion and right paratracheal soft tissue on chest x-ray yesterday. EXAM: CT CHEST WITHOUT CONTRAST TECHNIQUE: Multidetector CT imaging of the chest was performed following the standard protocol without IV contrast. Intravenous contrast was not administered due to the patient's chronic kidney disease. COMPARISON:  No prior CT.  Chest x-rays 05/21/2015 and earlier. FINDINGS: Best seen on coronal reformatted images is abrupt occlusion of the right upper lobe bronchus and the right bronchus intermedius centrally. As a result, there is dense consolidation in the entire right lung with a areas of  hypoattenuation giving a "drowned lung" appearance. Without intravenous contrast, it is difficult to visualize the large obstructing mass as it has similar attenuation to the consolidated lung. There is an associated small to moderate sized right pleural effusion. No pulmonary parenchymal nodules or masses involving the left lung. Linear scarring involving the medial left lower lobe. No confluent airspace consolidation in the left lung. No left pleural effusion. Apparent pleural thickening throughout the left hemithorax is related to abundant subpleural fat. Bulky right paratracheal conglomerate lymphadenopathy in station 2R and station 4R measuring approximately 5.6 x 5.0 x 7.4 cm. Enlarged lymph nodes elsewhere in the right side of the mediastinum. Enlarged right retroclavicular lymph nodes, the largest measuring approximately 2.8 x 3.8 x 3.1 cm. The bulky mediastinal lymphadenopathy compresses the superior vena cava, and there is a prominent right internal mammary vein collateral. Heart size upper normal. No pericardial effusion. Mild LAD and right coronary artery atherosclerosis. Moderate atherosclerosis involving the thoracic and upper abdominal aorta and their visualized branches. Approximate 3 cm cyst involving the posterior segment right lobe of liver. Within the limits of the unenhanced technique, no solid mass involving visualized liver. Approximate 1.6 x 2.6 x 2.2 cm low-attenuation mass involving the left adrenal gland. Solitary punctate calcification involving the proximal body of the pancreas. Visualized upper abdomen otherwise unremarkable for the unenhanced technique. IMPRESSION: 1. Obstructing central mass involving the right lung with occlusion of the right upper lobe bronchus and the bronchus intermedius with complete atelectasis of the right lung. The mass is difficult to visualize as it is of similar attenuation to the atelectatic lung. Bronchoscopy is recommended in further evaluation and for  diagnosis. 2. Small to moderate-sized right pleural effusion. 3. Metastatic mediastinal lymphadenopathy as detailed above, the largest conglomerate nodal mass in the right paratracheal region. 4. Mild compression of the superior vena cava with a prominent right internal mammary  vein collateral. 5. Approximate 3 cm cyst involving the posterior segment right lobe of the visualized liver. 6. Approximate 2.6 cm low-attenuation mass involving the left adrenal gland, likely adenoma. Electronically Signed   By: Evangeline Dakin M.D.   On: 05/20/2015 12:41   [place] Chest  06/02/2015  CLINICAL DATA:  Pleural effusion.  Lung cancer. EXAM: CHEST ULTRASOUND COMPARISON:  Chest x-ray 06/02/2015. FINDINGS: Moderate right-sided pleural effusion noted. IMPRESSION: Moderate right pleural effusion . Electronically Signed   By: Marcello Moores  Register   On: 06/02/2015 10:05   US Renal  05/21/2015  CLINICAL DATA:  Acute renal failure EXAM: RENAL / URINARY TRACT ULTRASOUND COMPLETE COMPARISON:  None. FINDINGS: Right Kidney: Length: 11 cm. Echogenicity within normal limits. No mass or hydronephrosis visualized. There is cortical thinning probable due to atrophy Left Kidney: Length: 12.2 cm. Cortical thinning is noted probable due to atrophy. Echogenicity within normal limits. No mass or hydronephrosis visualized. Bladder: The urinary bladder is decompressed with Foley catheter. IMPRESSION: 1. No hydronephrosis. No renal calculi. Bilateral cortical thinning probable due to atrophy. Decompressed urinary bladder with Foley catheter. Electronically Signed   By: Lahoma Crocker M.D.   On: 05/21/2015 09:25   Dg Chest Port 1 View  06/02/2015  CLINICAL DATA:  69 year old female with a history of small cell lung cancer EXAM: PORTABLE CHEST 1 VIEW COMPARISON:  Multiple prior plain film, including 05/31/2015, limb 10/2014, 05/29/2015, CT chest 05/20/2015 FINDINGS: Cardiomediastinal silhouette obscured by overlying lung/ pleural disease. Unchanged position  of endotracheal tube, terminating suitably above the carina, 4.5 cm. Unchanged gastric tube, terminating out of the field of view. Unchanged right upper extremity PICC, which terminates at the level the right mainstem bronchus. Overlying EKG leads and ventilator tubing. Atherosclerosis of the aortic arch. Dense opacity of the right lung persists, with obscuration the right hemidiaphragm, right heart borders, and the majority of the right lung. Minimal maintained aeration at the right apex. Airspace opacity at the left base persists, with similar aeration to the prior. No pneumothorax. IMPRESSION: Similar appearance of dense right-sided airspace opacity, likely a combination of lung consolidation, atelectasis, tumor tissue, and pleural effusion. Unchanged position of support apparatus, as above. Airspace disease at the left base, similar to the comparison, likely combination of atelectasis, edema, consolidation. Small left pleural effusion not excluded. Atherosclerosis. Signed, Dulcy Fanny. Earleen Newport, DO [facility] Electronically Signed   By: Corrie Mckusick D.O.   On: 06/02/2015 08:23   Dg Chest Port 1 View  05/26/2015  CLINICAL DATA:  Respiratory failure. EXAM: PORTABLE CHEST 1 VIEW COMPARISON:  05/25/2015 .  CT 05/20/2015. FINDINGS: Endotracheal tube and NG tube noted in stable position. Interim increase consolidation in the right upper lobe. Persistent right pleural effusion. Left lung is clear. Stable cardiomegaly. No pneumothorax. No acute osseus abnormality P IMPRESSION: Interim increase in consolidation of the right upper lobe with persistent right pleural effusion. Persistent occlusion of the right mainstem bronchus appears to be present. Electronically Signed   By: [place]   On: 05/26/2015 07:13   Dg Chest Port 1 View  05/25/2015  CLINICAL DATA:  Respiratory failure. EXAM: PORTABLE CHEST 1 VIEW COMPARISON:  05/21/2015 FINDINGS: The ET tube  tip is above the carina. The nasogastric tube tip is below the GE junction. Normal heart size. Aortic atherosclerosis. Partially loculated right pleural effusion is again identified and appears unchanged from the previous exam. Left lung is clear. IMPRESSION: 1. Loculated right pleural effusion is stable. 2. Left lung  is clear. Electronically Signed   By: Kerby Moors M.D.   On: 05/25/2015 09:03   Dg Chest Port 1 View  05/21/2015  CLINICAL DATA:  Respiratory failure, intubated EXAM: PORTABLE CHEST 1 VIEW COMPARISON:  05/20/2015 FINDINGS: Cardiomegaly again noted. Stable endotracheal and NG tube position. Again noted almost complete opacification of the right hemi thorax. Chronic elevation of the right hemidiaphragm. Mild left basilar atelectasis. No convincing pulmonary edema. No pneumothorax. Atherosclerotic calcifications of thoracic aorta again noted. IMPRESSION: Stable endotracheal and NG tube position. Again noted almost complete opacification of the right hemi thorax. Chronic elevation of the right hemidiaphragm. Mild left basilar atelectasis. No convincing pulmonary edema. No pneumothorax. Atherosclerotic calcifications of thoracic aorta again noted. Electronically Signed   By: Lahoma Crocker M.D.   On: 05/21/2015 08:22   Dg Chest Port 1 View  05/20/2015  CLINICAL DATA:  Acute respiratory failure with hypoxia. Status post bronchoscopy and line placement. EXAM: PORTABLE CHEST 1 VIEW COMPARISON:  05/20/2015 at 1302 hours FINDINGS: Following bronchoscopy, there is now partial aeration of the right lung noted in the right upper to mid lung. There is also some aeration now noted in the right lower lung with a band of opacity noted across the mid lung bordering the minor fissure. Left lung is hyperexpanded but essentially clear. No pneumothorax. Endotracheal tube is stable with its tip 4.7 cm above the carina. Orogastric tube passes below the diaphragm. IMPRESSION: 1. Improved right lung aeration following  bronchoscopy. No pneumothorax. 2. No other change. Electronically Signed   By: Lajean Manes M.D.   On: 05/20/2015 17:42   Dg Chest Port 1 View  05/20/2015  CLINICAL DATA:  Hypoxia EXAM: PORTABLE CHEST 1 VIEW COMPARISON:  Chest CT obtained earlier in the day; chest radiograph May 19, 2015 FINDINGS: Endotracheal tube tip is 4.6 cm above the carina. Nasogastric tube tip and side port are below the diaphragm. No pneumothorax. There is now complete opacification of the right hemi thorax, felt to be due to a combination of effusion and consolidation. Left lung is clear. Heart size is within normal limits. There is atherosclerotic change in the aorta. Mass lesions seen on CT are obscured on the right by the diffuse consolidation and effusion. IMPRESSION: Tube positions as described without pneumothorax. Left lung clear. Complete opacification on the right. Cardiac silhouette within normal limits. Atherosclerotic calcification noted. Electronically Signed   By: Lowella Grip III M.D.   On: 05/20/2015 13:18   Dg Chest Portable 1 View  05/02/2015  CLINICAL DATA:  69 year old female with increasing shortness of breath Coll the today. EXAM: PORTABLE CHEST 1 VIEW COMPARISON:  Chest x-ray 04/24/2015. FINDINGS: Enlarging right pleural effusion. Extensive opacities throughout the right mid to lower lung, which may simply reflect passive atelectasis, however, underlying airspace consolidation or underlying mass is not excluded. Persistent prominence of right paratracheal soft tissue highly concerning for lymphadenopathy. Left lung appears relatively clear, although the left base is poorly visualized medially (likely technique related). No evidence of pulmonary edema. Heart size is normal. Atherosclerotic calcifications in the thoracic aorta. IMPRESSION: 1. Overall, there has been significant progression compared to the prior chest x-ray from 04/24/2015, with enlarging now a large right pleural effusion and worsening  aeration throughout the right lung. This is unusual in the setting of a treated pneumonia, and given the presence of paratracheal soft tissue thickening on the right which is suspicious for lymphadenopathy, underlying neoplasm is strongly suspected. Further evaluation with contrast enhanced chest CT in is strongly  recommended in the near future to evaluate for underlying neoplasm. 2. Atherosclerosis. Electronically Signed   By: Vinnie Langton M.D.   On: 05/26/2015 01:27   Dg Abd Portable 1v  05/20/2015  CLINICAL DATA:  OG an indentation. EXAM: PORTABLE ABDOMEN - 1 VIEW COMPARISON:  None. FINDINGS: Enteric tube tip is adequately positioned in the body of the stomach. Mildly prominent gas-filled loops of small bowel are noted within the right lower quadrant. Overall bowel gas pattern is indeterminate. No evidence of free intraperitoneal air seen. Large dense opacity noted at the right lung base. IMPRESSION: 1. OG tube appears adequately positioned with tip in the region of the stomach body. 2. Large dense opacity at the right lung base, incompletely imaged, which could be consolidation or effusion. Electronically Signed   By: Franki Cabot M.D.   On: 05/20/2015 13:19    ASSESSMENT:  Small cell lung cancer.   PLAN:    1. Small cell lung cancer: Patient is likely stage IV given her suspicious adrenal lesion, but full staging workup cannot be completed given her acute illness. Patient has had multiple failed attempts at extubation likely secondary to external compression of her mass. Patient has completed cycle 1 of treatment with Cisplatin and Etoposide. Cycle 2 will occur in approximately 2 weeks. She will ultimately require port placement once she is extubated. XRT would be helpful, but there are patient safety concerns transporting to radiation oncology.  Will consider CT scan of chest later this week to assess for any interval change of her mass. 2. Thrombocytopenia: Most likely multifactorial  secondary to acute illness, chemotherapy is also a contributing factor. Will continue to monitor daily. 3. Tracheostomy: Agree that it is unsafe for surgery at this time given her thrombocytopenia which could decline after receiving chemotherapy this week. Case was previously discussed with ENT and appreciate their input.  4. Mechanical ventilation: Repeat extubation per pulmonology.   Will follow.   Lloyd Huger, MD   06/02/2015 1:32 PM

## 2015-06-02 NOTE — Progress Notes (Addendum)
[facility] Progess Note   ASSESSMENT/PLAN   Patient is a 69 year old female with newly diagnosed right complete lung atelectasis due to small cell lung cancer, which is newly diagnosed on this admission. The patient is vent dependent. Due to the right lung atelectasis. She has completed course of chemotherapy, trach is being considered, if the patient cannot be weaned off the ventilator.   Interim history: No acute events overnight.  She continues to be on the vent, her breathing is comfortable this morning. Slow vent wean at this time  PULMONARY A: -Acute respiratory failure due to complete right lung atelectasis from newly diagnosed SCLC. The patient has completed her first course of chemotherapy with 3 days of cisplatin and etoposide. Her respiratory function appears to be improved and the right lung atelectasis appears to be improving. - Pleural effusion - possible loculated in the right, may consider U/S thoracentesis once Plts are more stable.  - Vent dependent - f/u CXR in the AM -Pt has completed first course of chemo.  -ENT consulted for tracheostomy. Recommended holding off for now. Given elevated risk due to thrombocytopenia. P: -We will continue weaning trials, and see if the patient can be extubated over the next week before any potential tracheostomy is considered again.  CARDIOVASCULAR -A. fib with RVR. Patient was previously on a Cardizem drip, this was changed to amiodarone, the rate is now well controlled. -We'll continue amiodarone drip and changed to oral per protocol.  RENAL CKD, stable.  -Hypernatremia,slightly increasing, will inc D5W to 75cc/hr.  Currently, with NGT issues, once NGT is placed correctly will resume TF and free H2O flushes.    GASTROINTESTINAL ?Ileus >> resolved on today's KUB NGT replacement -tube feeds held, had large residuals yesterday (>500cc) per RN -NGT replaced, but still a little high, will recheck KUB in the  afternoon.  If still misplaced, will need weighted tube (Dubhoff). -Continue pepcid for GI prophylaxis.   HEMATOLOGIC R Lung Mass - SCLC thrombocytopenia -s/p cisplatin/etopiside (1st round, 3 days).  - low HIT panel, if plts start to rise may consider a direct thrombin inhibitor for DVT prophylaxis  BCx2 10/24; negative.  Bronch Cx. 05/20/15>> normal flora. AFB and fungi pending.    ENDOCRINE A:  Diabetes, hyperglycemia.  P:   continued every 4 hours sliding scale insulin. Continue Levemir.  NEUROLOGIC A: Anxiety.   MAJOR EVENTS/TEST RESULTS: Bronch 10/25 with forceps biopsy of RUL>> SCLC.   INDWELLING DEVICES:: Endotracheal tube: 10/25. CVC triple lumen: Right femoral: 10/25>>10/31 Double-lumen PICC line: 10/31.  ---------------------------------------   ----------------------------------------   Name: Emily Pratt MRN: [id number] DOB: 1946-02-10    ADMISSION DATE:  05/18/2015    SUBJECTIVE:  Pt currently on the ventilator,no acute complaints, no pain, able to communicate via writing pad.  Intubated and interactive.   Review of Systems:  GEN - no fever, no discomfort HEENT - no headache, no eye discomfort, no vision impairment CVS - no palpitations, no murmurs RESP - chronic sob,  ABD - no abd pain,  EXT - no jt pain  VITAL SIGNS: Temp:  [97.6 F (36.4 C)-97.9 F (36.6 C)] 97.6 F (36.4 C) (11/07 0726) Pulse Rate:  [72-103] 79 (11/07 0600) Resp:  [13-21] 16 (11/07 0600) BP: (74-159)/(52-115) 133/115 mmHg (11/07 0600) SpO2:  [93 %-99 %] 95 % (11/07 0600) FiO2 (%):  [35 %] 35 % (11/07 0330) Weight:  [252 lb 10.4 oz (114.6 kg)] 252 lb 10.4 oz (114.6 kg) (11/07 0527) HEMODYNAMICS:   VENTILATOR  SETTINGS: Vent Mode:  [-] PRVC FiO2 (%):  [35 %] 35 % Set Rate:  [14 bmp] 14 bmp Vt Set:  [500 mL] 500 mL PEEP:  [5 cmH20] 5 cmH20 INTAKE / OUTPUT:  Intake/Output Summary (Last 24 hours) at 06/02/15 0747 Last data filed at 06/02/15 0600  Gross per 24  hour  Intake 1466.3 ml  Output   5125 ml  Net -3658.7 ml    PHYSICAL EXAMINATION: Physical Examination:   VS: BP 133/115 mmHg  Pulse 79  Temp(Src) 97.6 F (36.4 C) (Axillary)  Resp 16  Ht '5\' 7"'$  (1.702 m)  Wt 252 lb 10.4 oz (114.6 kg)  BMI 39.56 kg/m2  SpO2 95%  General Appearance: No distress  Neuro:without focal findings, mental status normal. HEENT: PERRLA, EOM intact. Pulmonary: normal breath sounds, decreased on right side.  CardiovascularNormal S1,S2.  No m/r/g.   Abdomen: Benign, Soft, non-tender. Renal:  No costovertebral tenderness  GU:  Not performed at this time. Endocrine: No evident thyromegaly. Skin:   warm, no rashes, no ecchymosis  Extremities: normal, no cyanosis, clubbing.   LABS:   LABORATORY PANEL:   CBC  Recent Labs Lab 05/31/15 0445  WBC 3.7  HGB 10.4*  HCT 32.1*  PLT 49*    Chemistries   Recent Labs Lab 05/27/15 1457  06/02/15 0524  NA 151*  < > 153*  K 3.9  < > 4.0  CL 116*  < > 119*  CO2 28  < > 31  GLUCOSE 233*  < > 133*  BUN 67*  < > 40*  CREATININE 0.97  < > 0.79  CALCIUM 8.3*  < > 8.1*  MG  --   < > 1.5*  PHOS  --   < > 3.1  AST 35  --   --   ALT 49  --   --   ALKPHOS 43  --   --   BILITOT 0.8  --   --   < > = values in this interval not displayed.   Recent Labs Lab 06/01/15 1955 06/01/15 2155 06/02/15 0016 06/02/15 0211 06/02/15 0412 06/02/15 0739  GLUCAP 97 117* 123* 115* 106* 117*   No results for input(s): PHART, PCO2ART, PO2ART in the last 168 hours.  Recent Labs Lab 05/27/15 1457  AST 35  ALT 49  ALKPHOS 43  BILITOT 0.8  ALBUMIN 2.6*    Cardiac Enzymes No results for input(s): TROPONINI in the last 168 hours.  RADIOLOGY:  No results found.   I have personally obtained a history, examined the patient, evaluated Pertinent laboratory and RadioGraphic/imaging results, and  formulated the assessment and plan   The Patient requires high complexity decision making for assessment and  support, frequent evaluation and titration of therapies, application of advanced monitoring technologies and extensive interpretation of multiple databases. Critical Care Time devoted to patient care services described in this note is 40 minutes.   Overall, patient is critically ill, prognosis is guarded.    Vilinda Boehringer, MD [facility] Pager [phone] (please enter 7-digits) On Call Pager - [phone] (please enter 7-digits)

## 2015-06-02 NOTE — Progress Notes (Signed)
Nutrition Follow-up     INTERVENTION:   EN: plan to advance tube and repeat KUB this afternoon; if tube still not positioned correctly, possible plan to remove OG and place dobhoff tube. TF on hold until correct placement of feeding tube confirmed. Continue to assess Coordination of Care; current IVF and persistent hypernatremia discussed during ICU rounds, no TF or free water flushes at present. MD plans to increase D5 to 75 ml/hr; awaiting further follow-up with tube placement prior to re-initiation of TF and water flushes  NUTRITION DIAGNOSIS:   Inadequate oral intake related to acute illness as evidenced by NPO status. CVontinues  GOAL:   Provide needs based on ASPEN/SCCM guidelines  MONITOR:    (Energy Intake, Anthropometrics, Electrolyte/Renal Profile, Digestive System, Electrolyte/Renal Profile)  REASON FOR ASSESSMENT:   Consult Enteral/tube feeding initiation and management  ASSESSMENT:    Pt remains on vent  EN:  No TF ordered at present  Digestive System: abdominal xray this AM with OG tube at GE junction with advancement recommended; OG tube actually replaced and repeat xray showing tube terminates in the stomach but side-port may be above GE junction. Rectal tube in place with liquid stool,   Electrolyte and Renal Profile:  Recent Labs Lab 05/27/15 0450  05/31/15 0445 06/01/15 0514 06/02/15 0524  BUN  --   < > 72* 53* 40*  CREATININE  --   < > 0.90 0.77 0.79  NA  --   < > 153* 151* 153*  K  --   < > 4.6 4.2 4.0  MG 2.0  --  1.7  --  1.5*  PHOS 4.0  --  4.0  --  3.1  < > = values in this interval not displayed. Glucose Profile:  Recent Labs  06/02/15 0412 06/02/15 0739 06/02/15 0946  GLUCAP 106* 117* 133*   Meds: D5 at 50 ml/hr  Height:   Ht Readings from Last 1 Encounters:  05/12/2015 '5\' 7"'$  (1.702 m)    Weight:   Wt Readings from Last 1 Encounters:  06/02/15 252 lb 10.4 oz (114.6 kg)    BMI:  Body mass index is 39.56  kg/(m^2).  Estimated Nutritional Needs:   Kcal:  0518-3358 kcals (11-14 kcals/kg) using current wt of 105.6 kg  Protein:  92-122 g (1.5-2.0 g/kg)   Fluid:  1525-1830 mL (25-30 ml/kg)   EDUCATION NEEDS:   No education needs identified at this time  [place], Rosa Sanchez, LDN [phone] Pager

## 2015-06-02 NOTE — Progress Notes (Signed)
E-link MD called regarding KUB and said OG may be used.

## 2015-06-02 NOTE — Progress Notes (Signed)
I informed Dr. Stevenson Clinch of results of KUB.

## 2015-06-02 NOTE — Care Management Note (Addendum)
Case Management Note  Patient Details  Name: MAYRENE BASTARACHE MRN: [id number] Date of Birth: 04/20/1946  Subjective/Objective:  Remains vented. New right pleural effusion. Plan is trach once platelets are more stable. Per oncology, more chemo in approximately 3 weeks.                  Action/Plan: LTACH  If no chemo verses SNF.   Expected Discharge Date:                  Expected Discharge Plan:     In-House Referral:     Discharge planning Services     Post Acute Care Choice:    Choice offered to:     DME Arranged:    DME Agency:     HH Arranged:    HH Agency:     Status of Service:  In process, will continue to follow  Medicare Important Message Given:    Date Medicare IM Given:    Medicare IM give by:    Date Additional Medicare IM Given:    Additional Medicare Important Message give by:     If discussed at [place] of Stay Meetings, dates discussed:    Additional Comments:  Jolly Mango, RN 06/02/2015, 2:18 PM

## 2015-06-02 NOTE — Progress Notes (Signed)
Pharmacy Consult for Fluconazole  Indication: UTI  Allergies  Allergen Reactions  . Atorvastatin Other (See Comments)    Does not remember why she had Intolerance to Lipitor.  . Fentanyl Itching  . Rosuvastatin Anxiety    Chest tigtness and feeling as if she had heartburn.    Patient Measurements: Height: '5\' 7"'$  (170.2 cm) Weight: 252 lb 10.4 oz (114.6 kg) IBW/kg (Calculated) : 61.6  Vital Signs: Temp: 97.5 F (36.4 C) (11/07 1148) Temp Source: Axillary (11/07 1148) BP: 144/69 mmHg (11/07 0900) Pulse Rate: 86 (11/07 0900) Intake/Output from previous day: 11/06 0701 - 11/07 0700 In: 1741.7 [I.V.:1521.7; IV [id number]:588] Out: 5027 [Urine:4625; Emesis/NG output:500] Intake/Output from this shift: Total I/O In: 65.8 [I.V.:65.8] Out: -   Labs:  Recent Labs  05/31/15 0445 06/01/15 0514 06/02/15 0524  WBC 3.7  --   --   HGB 10.4*  --   --   PLT 49*  --   --   CREATININE 0.90 0.77 0.79   Estimated Creatinine Clearance: 89.2 mL/min (by C-G formula based on Cr of 0.79). No results for input(s): VANCOTROUGH, VANCOPEAK, VANCORANDOM, GENTTROUGH, GENTPEAK, GENTRANDOM, TOBRATROUGH, TOBRAPEAK, TOBRARND, AMIKACINPEAK, AMIKACINTROU, AMIKACIN in the last 72 hours.   Microbiology: Recent Results (from the past 720 hour(s))  MRSA PCR Screening     Status: None   Collection Time: 05/22/2015 12:28 AM  Result Value Ref Range Status   MRSA by PCR NEGATIVE NEGATIVE Final    Comment:        The GeneXpert MRSA Assay (FDA approved for NASAL specimens only), is one component of a comprehensive MRSA colonization surveillance program. It is not intended to diagnose MRSA infection nor to guide or monitor treatment for MRSA infections.   Culture, blood (routine x 2)     Status: None   Collection Time: 05/17/2015  1:13 AM  Result Value Ref Range Status   Specimen Description BLOOD RIGHT HAND  Final   Special Requests BOTTLES DRAWN AEROBIC AND ANAEROBIC 3CC  Final   Culture NO GROWTH 5  DAYS  Final   Report Status 05/24/2015 FINAL  Final  Culture, blood (routine x 2)     Status: None   Collection Time: 04/26/2015  1:13 AM  Result Value Ref Range Status   Specimen Description BLOOD RIGHT ASSIST CONTROL  Final   Special Requests BOTTLES DRAWN AEROBIC AND ANAEROBIC 4CC  Final   Culture NO GROWTH 5 DAYS  Final   Report Status 05/24/2015 FINAL  Final  Culture, expectorated sputum-assessment     Status: None   Collection Time: 05/20/15  2:00 PM  Result Value Ref Range Status   Specimen Description EXPECTORATED SPUTUM  Final   Special Requests NONE  Final   Sputum evaluation THIS SPECIMEN IS ACCEPTABLE FOR SPUTUM CULTURE  Final   Report Status 05/20/2015 FINAL  Final  Culture, respiratory (NON-Expectorated)     Status: None   Collection Time: 05/20/15  2:00 PM  Result Value Ref Range Status   Specimen Description EXPECTORATED SPUTUM  Final   Special Requests NONE Reflexed from [id number]  Final   Gram Stain   Final    FEW WBC SEEN FEW GRAM POSITIVE RODS FAIR SPECIMEN - 70-80% WBCS    Culture APPEARS TO BE NORMAL FLORA  Final   Report Status 05/22/2015 FINAL  Final  Culture, bal-quantitative     Status: None   Collection Time: 05/20/15  5:05 PM  Result Value Ref Range Status   Specimen Description BRONCHIAL ALVEOLAR LAVAGE  Final   Special Requests Normal  Final   Gram Stain   Final    MODERATE WBC SEEN RARE GRAM POSITIVE RODS RARE GRAM POSITIVE COCCI IN CHAINS GOOD SPECIMEN - 80-90% WBCS    Culture APPEARS TO BE NORMAL FLORA  Final   Report Status 05/22/2015 FINAL  Final  Fungus Culture with Smear     Status: None (Preliminary result)   Collection Time: 05/20/15  5:05 PM  Result Value Ref Range Status   Specimen Description SPUTUM  Final   Special Requests Normal  Final   Culture CANDIDA DUBLINIENSIS  Final   Report Status PENDING  Incomplete  C difficile quick scan w PCR reflex     Status: None   Collection Time: 05/29/15  4:50 PM  Result Value Ref Range Status    C Diff antigen NEGATIVE NEGATIVE Final   C Diff toxin NEGATIVE NEGATIVE Final   C Diff interpretation Negative for C. difficile  Final  Urine culture     Status: None (Preliminary result)   Collection Time: 05/29/15  6:19 PM  Result Value Ref Range Status   Specimen Description URINE, RANDOM  Final   Special Requests NONE  Final   Culture   Final    >=100,000 COLONIES/mL YEAST REPEATING TO CONFIRM ID    Report Status PENDING  Incomplete    Medical History: Past Medical History  Diagnosis Date  . Anxiety   . Arthritis   . COPD (chronic obstructive pulmonary disease) ([place])   . CHF (congestive heart failure) ([place])   . Hyperlipidemia   . Hypertension   . Diabetes mellitus without complication ([place])   . Chronic kidney disease   . Neuromuscular disorder ([facility])     Medications:  Scheduled:  . allopurinol  100 mg Oral Daily  . amLODipine  5 mg Oral Daily  . antiseptic oral rinse  7 mL Mouth Rinse QID  . budesonide (PULMICORT) nebulizer solution  0.5 mg Nebulization BID  . chlorhexidine gluconate  15 mL Mouth Rinse BID  . famotidine  20 mg Per Tube BID  . [START ON 06/03/2015] fluconazole  200 mg Oral Daily  . insulin aspart  0-15 Units Subcutaneous 6 times per day  . insulin detemir  15 Units Subcutaneous Q24H  . ipratropium-albuterol  3 mL Nebulization Q6H  . losartan  100 mg Oral Daily  . metoprolol  100 mg Per Tube BID  . pregabalin  50 mg Per Tube TID   Infusions:  . dexmedetomidine 0.6 mcg/kg/hr (06/02/15 0751)  . dextrose 50 mL/hr at 06/02/15 1027   Assessment: 69 y/o Female, day 15 of hospital stay. Patient  with SCLC and currently intubated. Urine cultures from 11/3 growing yeast. Pharmacy consulted for dosing of fluconazole. Patient received '400mg'$  IV x 2 doses.     Plan:  Will change patients dose to fluconazole '200mg'$  po daily based on guideline dosing for candidiasis of urogenital site. Duration of treatment is 2 weeks.  Pharmacy will continue to monitor  patients labs and renal function and make adjustments as needed.   Nancy Fetter, PharmD Pharmacy Resident

## 2015-06-02 NOTE — Progress Notes (Addendum)
[facility] NAME: Emily Pratt    MR#:  [id number]  DATE OF BIRTH:  1945/12/28  SUBJECTIVE:  CHIEF COMPLAINT: Pt is awake and alert, not on sedation, on vent. REVIEW OF SYSTEMS:   Limited ROS.  No fever or chills. No chest pain, palpitation, SOB. No nausea, vomiting, but has watery stool.  DRUG ALLERGIES:   Allergies  Allergen Reactions  . Atorvastatin Other (See Comments)    Does not remember why she had Intolerance to Lipitor.  . Fentanyl Itching  . Rosuvastatin Anxiety    Chest tigtness and feeling as if she had heartburn.    VITALS:  Blood pressure 144/69, pulse 86, temperature 97.6 F (36.4 C), temperature source Axillary, resp. rate 23, height '5\' 7"'$  (1.702 m), weight 114.6 kg (252 lb 10.4 oz), SpO2 96 %.  PHYSICAL EXAMINATION:  GENERAL:  69 y.o.-year-old patient lying in the bed, awake, on vent. Obese. EYES: Pupils equal, round, reactive to light and accommodation. No scleral icterus.  HEENT: Head atraumatic, normocephalic. ETT in place NECK:  Supple, no jugular venous distention. No thyroid enlargement, no tenderness.  LUNGS: Better air entrance on the right,  diminished breath sounds on the right , no wheezing or rhonchi , good air movements on the left , on ventilator support. CARDIOVASCULAR: S1, S2 normal. No murmurs, rubs, or gallops.  ABDOMEN: Soft, nontender, distended. Bowel sounds slugish. No organomegaly or mass.  EXTREMITIES: +1 pedal edema, no cyanosis, or clubbing.  NEUROLOGIC: CN 2-12 intact, strength 4/5,  Follows limited commands PSYCHIATRIC: awake and alert. SKIN: Sacral decubitus ulcer    LABORATORY PANEL:   CBC  Recent Labs Lab 05/31/15 0445  WBC 3.7  HGB 10.4*  HCT 32.1*  PLT 49*   ------------------------------------------------------------------------------------------------------------------  Chemistries   Recent Labs Lab 05/27/15 1457  06/02/15 0524  NA 151*  < >  153*  K 3.9  < > 4.0  CL 116*  < > 119*  CO2 28  < > 31  GLUCOSE 233*  < > 133*  BUN 67*  < > 40*  CREATININE 0.97  < > 0.79  CALCIUM 8.3*  < > 8.1*  MG  --   < > 1.5*  AST 35  --   --   ALT 49  --   --   ALKPHOS 43  --   --   BILITOT 0.8  --   --   < > = values in this interval not displayed. ------------------------------------------------------------------------------------------------------------------  Cardiac Enzymes No results for input(s): TROPONINI in the last 168 hours. ------------------------------------------------------------------------------------------------------------------  RADIOLOGY:  Dg Abd 1 View  06/02/2015  CLINICAL DATA:  Orogastric tube placement. EXAM: ABDOMEN - 1 VIEW COMPARISON:  Earlier today at 0800 hours. FINDINGS: Motion and patient body habitus degradation. Nasogastric tube terminates at the body of the stomach. The side-port may be above the gastroesophageal junction. No gross free intraperitoneal air. Partial right hemi thorax opacification, incompletely imaged. IMPRESSION: Degraded exam, as detailed above. Nasogastric terminating at the body of the stomach. Electronically Signed   By: Abigail Miyamoto M.D.   On: 06/02/2015 09:28   Dg Abd 1 View  06/02/2015  CLINICAL DATA:  NG tube placement . EXAM: ABDOMEN - 1 VIEW COMPARISON:  05/30/2015 . FINDINGS: NG tube tip noted at the level of the gastroesophageal junction. More distal placement should be considered. Persistent bowel distention. No free air . IMPRESSION: NG tube tip noted at the level of the gastroesophageal junction.  More distal placement should be considered.These results will be called to the ordering clinician or representative by the Radiologist Assistant, and communication documented in the PACS or zVision Dashboard. Electronically Signed   By: Marcello Moores  Register   On: 06/02/2015 08:19   Dg Chest Port 1 View  06/02/2015  CLINICAL DATA:  69 year old female with a history of small cell lung  cancer EXAM: PORTABLE CHEST 1 VIEW COMPARISON:  Multiple prior plain film, including 05/31/2015, limb 10/2014, 05/29/2015, CT chest 05/20/2015 FINDINGS: Cardiomediastinal silhouette obscured by overlying lung/ pleural disease. Unchanged position of endotracheal tube, terminating suitably above the carina, 4.5 cm. Unchanged gastric tube, terminating out of the field of view. Unchanged right upper extremity PICC, which terminates at the level the right mainstem bronchus. Overlying EKG leads and ventilator tubing. Atherosclerosis of the aortic arch. Dense opacity of the right lung persists, with obscuration the right hemidiaphragm, right heart borders, and the majority of the right lung. Minimal maintained aeration at the right apex. Airspace opacity at the left base persists, with similar aeration to the prior. No pneumothorax. IMPRESSION: Similar appearance of dense right-sided airspace opacity, likely a combination of lung consolidation, atelectasis, tumor tissue, and pleural effusion. Unchanged position of support apparatus, as above. Airspace disease at the left base, similar to the comparison, likely combination of atelectasis, edema, consolidation. Small left pleural effusion not excluded. Atherosclerosis. Signed, Dulcy Fanny. Earleen Newport, DO [facility] Electronically Signed   By: Corrie Mckusick D.O.   On: 06/02/2015 08:23    EKG:   Orders placed or performed during the hospital encounter of 05/09/2015  . EKG 12-Lead  . EKG 12-Lead    ASSESSMENT AND PLAN:   1. Acute on chronic respiratory failure with hypoxia and Hypercapnia,  - Multifactorial due to large right-sided pleural effusion/ CHF/ COPD / mass- SCLC - Pulmonology and oncology is following, failed multiple extubation attempts.  - Continue nebulizer, steroids, antibiotics. Oncology initiated chemotherapy October 31st 2016 with cisplatin and etoposide with hopes to shrink the mass. Timing of  shrinkage  is unclear,  chest x-ray does not show any improvement. Appreciate palliative care input , ENT saw patient and discussed this oncologist , no tracheostomy tube is recommended at this time but revisit issue in about 1 week. Try to wean off vent today and get chest [place] for evaluation of pleural effusion per Dr. Stevenson Clinch.  2. Acute Congestive heart failure: Mixed systolic and diastolic  - Echo 96/22 shows ejection fraction 30 -35% with abnormal filling - Continue metoprolol per cardiology's recommendations , resumed losartan.  3. Acute on chronic kidney disease:  - Due to ATN, hypovolemia and hypotension, Cr. Is normal and BUN is improving. - Nephrology signed off. Patient was restarted on losartan.  Continue 1/2 NS iv, F/u BMP.  4. Essential hypertension:  continue metoprolol and losartan, norvasc, better blood pressure control  5. Diabetes mellitus type 2: Hold home medications Continue levemir 15 unit HS and sliding scale, blood glucose is controlled. Now holding tube feed due to mild ileus.  6.  Small cell lung cancer, likely stage IV per oncology - Status post bronchoscopy 10/25, pathology show small cell lung cancer - Long-term history of heavy smoking - pathology reports were discussed with the patient and sister in the past.   - Oncology initiated chemotherapy ,  PICC line for chemotherapy is placed 05/26/2015- Radiation oncology has been consulted and patient may benefit from radiology oncology intervention as well, since she is intubated. It might  be difficult to perform. Following patient's platelet count closely with chemotherapy  get chest [place] for evaluation of pleural effusion per Dr. Stevenson Clinch.  7. Postobstructive pneumonia versus atelectasis - Blood cultures negative to date, respiratory, BAL, AFB normal flora - Discontinue vancomycin and Zosyn, completed 7 day Levaquin course  8. Arrhythmia, SVT versus paroxysmal atrial fibrillation during bronchoscopy, now in sinus  rhythm, continue metoprolol per cardiology's recommendations  9 thrombocytopenia, following closely, may need transfusions with chemotherapy for lung ca, It is holding the decision of doing trach.  10. Hypernatremia, continue 1/2 NS iv. F/u BMP.  11. UTI  Ur cx showed yeast,  Discontinue Rocephin IV,  Diflucan PTD.  12. Ileus   Hold feeding and f/u surgical consult- repeat xray: mild ileus.  * Hypomagnesemia. Mag iv, f/u level.  D/w Dr. Stevenson Clinch. D/w CM, not a candidate for LTAC. Greater than 50% time was spent on coordination of care and face-to-face counseling.  CODE STATUS: Full code  TOTAL CRITICAL CARE TIME TAKING CARE OF THIS PATIENT: 39 minutes.    Demetrios Loll M.D on 06/02/2015 at 9:48 AM  Between 7am to 6pm - Pager - [phone] After 6pm go to [email or web address] - password [email or web address] [facility]  Office  [phone]  CC: Primary care physician; Dagoberto Ligas, MD

## 2015-06-02 NOTE — Progress Notes (Signed)
Upon assessment  Of OG tube unable to auscultate air instilled x3 and patient burps when auscultation performed. I informed Dr. Stevenson Clinch and an order received for KUB.

## 2015-06-02 NOTE — Progress Notes (Signed)
Current OG tube removed due to place ment and 18 FR OG tube inserted. KUB ordered.

## 2015-06-03 LAB — GLUCOSE, CAPILLARY
Glucose-Capillary: 102 mg/dL — ABNORMAL HIGH (ref 65–99)
Glucose-Capillary: 103 mg/dL — ABNORMAL HIGH (ref 65–99)
Glucose-Capillary: 108 mg/dL — ABNORMAL HIGH (ref 65–99)
Glucose-Capillary: 109 mg/dL — ABNORMAL HIGH (ref 65–99)
Glucose-Capillary: 112 mg/dL — ABNORMAL HIGH (ref 65–99)
Glucose-Capillary: 114 mg/dL — ABNORMAL HIGH (ref 65–99)
Glucose-Capillary: 130 mg/dL — ABNORMAL HIGH (ref 65–99)
Glucose-Capillary: 130 mg/dL — ABNORMAL HIGH (ref 65–99)

## 2015-06-03 LAB — CBC WITH DIFFERENTIAL/PLATELET
BASOS ABS: 0 10*3/uL (ref 0–0.1)
Eosinophils Absolute: 0 10*3/uL (ref 0–0.7)
Eosinophils Relative: 2 %
HEMATOCRIT: 27.8 % — AB (ref 35.0–47.0)
HEMOGLOBIN: 9.1 g/dL — AB (ref 12.0–16.0)
Lymphocytes Relative: 69 %
Lymphs Abs: 0.5 10*3/uL — ABNORMAL LOW (ref 1.0–3.6)
MCH: 30.1 pg (ref 26.0–34.0)
MCHC: 32.8 g/dL (ref 32.0–36.0)
MCV: 91.8 fL (ref 80.0–100.0)
MONO ABS: 0 10*3/uL — AB (ref 0.2–0.9)
Monocytes Relative: 1 %
NEUTROS ABS: 0.2 10*3/uL — AB (ref 1.4–6.5)
Platelets: 33 10*3/uL — ABNORMAL LOW (ref 150–440)
RBC: 3.03 MIL/uL — ABNORMAL LOW (ref 3.80–5.20)
RDW: 17.6 % — ABNORMAL HIGH (ref 11.5–14.5)
WBC: 0.8 10*3/uL — CL (ref 3.6–11.0)

## 2015-06-03 LAB — BASIC METABOLIC PANEL
ANION GAP: 4 — AB (ref 5–15)
BUN: 29 mg/dL — AB (ref 6–20)
CHLORIDE: 115 mmol/L — AB (ref 101–111)
CO2: 30 mmol/L (ref 22–32)
Calcium: 7.9 mg/dL — ABNORMAL LOW (ref 8.9–10.3)
Creatinine, Ser: 0.7 mg/dL (ref 0.44–1.00)
GFR calc Af Amer: >60 mL/min (ref >60)
GLUCOSE: 110 mg/dL — AB (ref 65–99)
POTASSIUM: 3.6 mmol/L (ref 3.5–5.1)
SODIUM: 149 mmol/L — AB (ref 135–145)

## 2015-06-03 LAB — MAGNESIUM: MAGNESIUM: 1.6 mg/dL — AB (ref 1.7–2.4)

## 2015-06-03 MED ORDER — FILGRASTIM 300 MCG/ML IJ SOLN
300.0000 ug | Freq: Every day | INTRAMUSCULAR | Status: DC
  Administered 2015-06-03 – 2015-06-06 (×4): 300 ug via SUBCUTANEOUS
  Filled 2015-06-03 (×6): qty 1

## 2015-06-03 MED ORDER — VITAL HIGH PROTEIN PO LIQD
1000.0000 mL | ORAL | Status: DC
  Administered 2015-06-03 – 2015-06-04 (×2): 1000 mL

## 2015-06-03 MED ORDER — DEXMEDETOMIDINE HCL IN NACL 400 MCG/100ML IV SOLN
0.0000 ug/kg/h | INTRAVENOUS | Status: AC
  Administered 2015-06-03 – 2015-06-04 (×2): 0.6 ug/kg/h via INTRAVENOUS
  Administered 2015-06-04 – 2015-06-05 (×4): 0.8 ug/kg/h via INTRAVENOUS
  Administered 2015-06-05: 0.799 ug/kg/h via INTRAVENOUS
  Administered 2015-06-05 (×2): 0.8 ug/kg/h via INTRAVENOUS
  Administered 2015-06-06: 0.799 ug/kg/h via INTRAVENOUS
  Administered 2015-06-06: 0.4 ug/kg/h via INTRAVENOUS
  Administered 2015-06-06: 0.8 ug/kg/h via INTRAVENOUS
  Filled 2015-06-03 (×12): qty 100

## 2015-06-03 MED ORDER — FREE WATER
200.0000 mL | Freq: Three times a day (TID) | Status: DC
  Administered 2015-06-03 – 2015-06-04 (×4): 200 mL

## 2015-06-03 MED ORDER — MAGNESIUM SULFATE 4 GM/100ML IV SOLN
4.0000 g | Freq: Once | INTRAVENOUS | Status: AC
  Administered 2015-06-03: 4 g via INTRAVENOUS
  Filled 2015-06-03: qty 100

## 2015-06-03 NOTE — Progress Notes (Signed)
[facility] Progess Note   ASSESSMENT/PLAN   Patient is a 69 year old female with newly diagnosed right complete lung atelectasis due to small cell lung cancer, which is newly diagnosed on this admission. The patient is vent dependent. Due to the right lung atelectasis. She has completed course of chemotherapy, trach is being considered, if the patient cannot be weaned off the ventilator. Currently any surgical procedures being complicated by her thrombocytopenia, secondary to a nadir from chemotherapy   Interim history: No acute events overnight.  She continues to be on the vent, her breathing is comfortable this morning. Slow vent wean at this time  PULMONARY -Acute respiratory failure due to complete right lung atelectasis from newly diagnosed SCLC. The patient has completed her first course of chemotherapy with 3 days of cisplatin and etoposide. Her respiratory function appears to be improved and the right lung atelectasis appears to be improving. - Pleural effusion - possible loculated in the right, may consider U/S thoracentesis once Plts are more stable.  - Vent dependent - f/u CXR in the AM -Pt has completed first course of chemo.  -ENT consulted for tracheostomy. Recommended holding off for now, Given elevated risk due to thrombocytopenia. -Patient currently in her nadir from chemotherapy, started on Neupogen today. -We will continue weaning trials, and see if the patient can be extubated over the next week before any potential tracheostomy is considered again.  CARDIOVASCULAR -A. fib with RVR. Patient was previously on a Cardizem drip, this was changed to amiodarone, the rate is now well controlled. -We'll continue amiodarone drip and changed to oral per protocol.  RENAL CKD, stable.  -Hypernatremia,slightly increasing, will inc D5W to 75cc/hr.  Currently, with NGT issues, once NGT is placed correctly will resume TF and free H2O flushes.     GASTROINTESTINAL ?Ileus >> resolved on today's KUB NGT replacement -Restart tube feeds -Continue pepcid for GI prophylaxis.   HEMATOLOGIC R Lung Mass - SCLC thrombocytopenia Neutropenia -s/p cisplatin/etopiside (1st round, 3 days).  - low HIT panel, if plts start to rise may consider a direct thrombin inhibitor for DVT prophylaxis -Started on Neupogen  BCx2 10/24; negative.  Bronch Cx. 05/20/15>> normal flora. AFB and fungi pending.    ENDOCRINE A:  Diabetes, hyperglycemia.  P:   continued every 4 hours sliding scale insulin. Continue Levemir.  NEUROLOGIC A: Anxiety.   MAJOR EVENTS/TEST RESULTS: Bronch 10/25 with forceps biopsy of RUL>> SCLC.   INDWELLING DEVICES:: Endotracheal tube: 10/25. CVC triple lumen: Right femoral: 10/25>>10/31 Double-lumen PICC line: 10/31.  ---------------------------------------   ----------------------------------------   Name: MATTISYN CARDONA MRN: [id number] DOB: 1946/06/06    ADMISSION DATE:  04/30/2015    SUBJECTIVE:  Pt currently on the ventilator,no acute complaints, no pain, able to communicate via writing pad.  Intubated and interactive. Sister at bedside  Review of Systems:  GEN - no fever, no discomfort HEENT - no headache, no eye discomfort, no vision impairment CVS - no palpitations, no murmurs RESP - chronic sob,  ABD - no abd pain,  EXT - no jt pain  VITAL SIGNS: Temp:  [97.6 F (36.4 C)-98.4 F (36.9 C)] 98 F (36.7 C) (11/08 1200) Pulse Rate:  [67-85] 77 (11/08 1400) Resp:  [10-19] 18 (11/08 1400) BP: (121-145)/(65-112) 143/77 mmHg (11/08 1400) SpO2:  [92 %-99 %] 92 % (11/08 1400) FiO2 (%):  [35 %] 35 % (11/08 1156) Weight:  [246 lb 4.1 oz (111.7 kg)] 246 lb 4.1 oz (111.7 kg) (11/08 0500) HEMODYNAMICS:  VENTILATOR SETTINGS: Vent Mode:  [-] Spontaneous FiO2 (%):  [35 %] 35 % Set Rate:  [14 bmp] 14 bmp Vt Set:  [500 mL] 500 mL PEEP:  [5 cmH20] 5 cmH20 INTAKE / OUTPUT:  Intake/Output Summary  (Last 24 hours) at 06/03/15 1416 Last data filed at 06/03/15 1402  Gross per 24 hour  Intake 2507.53 ml  Output   1800 ml  Net 707.53 ml    PHYSICAL EXAMINATION: Physical Examination:   VS: BP 143/77 mmHg  Pulse 77  Temp(Src) 98 F (36.7 C) (Oral)  Resp 18  Ht '5\' 7"'$  (1.702 m)  Wt 246 lb 4.1 oz (111.7 kg)  BMI 38.56 kg/m2  SpO2 92%  General Appearance: No distress  Neuro:without focal findings, mental status normal. HEENT: PERRLA, EOM intact. Pulmonary: normal breath sounds, decreased on right side.  CardiovascularNormal S1,S2.  No m/r/g.   Abdomen: Benign, Soft, non-tender. Renal:  No costovertebral tenderness  GU:  Not performed at this time. Endocrine: No evident thyromegaly. Skin:   warm, no rashes, no ecchymosis  Extremities: normal, no cyanosis, clubbing.   LABS:   LABORATORY PANEL:   CBC  Recent Labs Lab 06/03/15 0539  WBC 0.8*  HGB 9.1*  HCT 27.8*  PLT 33*    Chemistries   Recent Labs Lab 05/27/15 1457  06/02/15 0524  06/03/15 0539  NA 151*  < > 153*  < > 149*  K 3.9  < > 4.0  < > 3.6  CL 116*  < > 119*  < > 115*  CO2 28  < > 31  < > 30  GLUCOSE 233*  < > 133*  < > 110*  BUN 67*  < > 40*  < > 29*  CREATININE 0.97  < > 0.79  < > 0.70  CALCIUM 8.3*  < > 8.1*  < > 7.9*  MG  --   < > 1.5*  --  1.6*  PHOS  --   < > 3.1  --   --   AST 35  --   --   --   --   ALT 49  --   --   --   --   ALKPHOS 43  --   --   --   --   BILITOT 0.8  --   --   --   --   < > = values in this interval not displayed.   Recent Labs Lab 06/02/15 1955 06/02/15 2356 06/03/15 0350 06/03/15 0800 06/03/15 0955 06/03/15 1200  GLUCAP 112* 103* 108* 102* 112* 114*   No results for input(s): PHART, PCO2ART, PO2ART in the last 168 hours.  Recent Labs Lab 05/27/15 1457  AST 35  ALT 49  ALKPHOS 43  BILITOT 0.8  ALBUMIN 2.6*    Cardiac Enzymes No results for input(s): TROPONINI in the last 168 hours.  RADIOLOGY:  Dg Abd 1 View  06/02/2015  CLINICAL DATA:   Status post OG tube placement. EXAM: ABDOMEN - 1 VIEW COMPARISON:  None. FINDINGS: OG tube is in place with the tip in the stomach. The tube could be advanced 3-4 cm for better positioning. IMPRESSION: As above. Electronically Signed   By: Inge Rise M.D.   On: 06/02/2015 15:21   Dg Abd 1 View  06/02/2015  CLINICAL DATA:  Orogastric tube placement. EXAM: ABDOMEN - 1 VIEW COMPARISON:  Earlier today at 0800 hours. FINDINGS: Motion and patient body habitus degradation. Nasogastric tube terminates at the body of the  stomach. The side-port may be above the gastroesophageal junction. No gross free intraperitoneal air. Partial right hemi thorax opacification, incompletely imaged. IMPRESSION: Degraded exam, as detailed above. Nasogastric terminating at the body of the stomach. Electronically Signed   By: Abigail Miyamoto M.D.   On: 06/02/2015 09:28   Dg Abd 1 View  06/02/2015  CLINICAL DATA:  NG tube placement . EXAM: ABDOMEN - 1 VIEW COMPARISON:  05/30/2015 . FINDINGS: NG tube tip noted at the level of the gastroesophageal junction. More distal placement should be considered. Persistent bowel distention. No free air . IMPRESSION: NG tube tip noted at the level of the gastroesophageal junction. More distal placement should be considered.These results will be called to the ordering clinician or representative by the Radiologist Assistant, and communication documented in the PACS or zVision Dashboard. Electronically Signed   By: Marcello Moores  Register   On: 06/02/2015 08:19   [place] Chest  06/02/2015  CLINICAL DATA:  Pleural effusion.  Lung cancer. EXAM: CHEST ULTRASOUND COMPARISON:  Chest x-ray 06/02/2015. FINDINGS: Moderate right-sided pleural effusion noted. IMPRESSION: Moderate right pleural effusion . Electronically Signed   By: Marcello Moores  Register   On: 06/02/2015 10:05   Dg Chest Port 1 View  06/02/2015  CLINICAL DATA:  69 year old female with a history of small cell lung cancer EXAM: PORTABLE CHEST 1 VIEW COMPARISON:   Multiple prior plain film, including 05/31/2015, limb 10/2014, 05/29/2015, CT chest 05/20/2015 FINDINGS: Cardiomediastinal silhouette obscured by overlying lung/ pleural disease. Unchanged position of endotracheal tube, terminating suitably above the carina, 4.5 cm. Unchanged gastric tube, terminating out of the field of view. Unchanged right upper extremity PICC, which terminates at the level the right mainstem bronchus. Overlying EKG leads and ventilator tubing. Atherosclerosis of the aortic arch. Dense opacity of the right lung persists, with obscuration the right hemidiaphragm, right heart borders, and the majority of the right lung. Minimal maintained aeration at the right apex. Airspace opacity at the left base persists, with similar aeration to the prior. No pneumothorax. IMPRESSION: Similar appearance of dense right-sided airspace opacity, likely a combination of lung consolidation, atelectasis, tumor tissue, and pleural effusion. Unchanged position of support apparatus, as above. Airspace disease at the left base, similar to the comparison, likely combination of atelectasis, edema, consolidation. Small left pleural effusion not excluded. Atherosclerosis. Signed, Dulcy Fanny. Earleen Newport, DO [facility] Rad[facility] Electronically Signed   By: Corrie Mckusick D.O.   On: 06/02/2015 08:23     I have personally obtained a history, examined the patient, evaluated Pertinent laboratory and RadioGraphic/imaging results, and  formulated the assessment and plan   The Patient requires high complexity decision making for assessment and support, frequent evaluation and titration of therapies, application of advanced monitoring technologies and extensive interpretation of multiple databases. Critical Care Time devoted to patient care services described in this note is 40 minutes.   Overall, patient is critically ill, prognosis is guarded.    Vilinda Boehringer, MD [place]  Pulmonary and Critical Care Pager [phone] (please enter 7-digits) On Call Pager - [phone] (please enter 7-digits)

## 2015-06-03 NOTE — Progress Notes (Signed)
Pharmacy Consult for Fluconazole  Indication: UTI  Allergies  Allergen Reactions  . Atorvastatin Other (See Comments)    Does not remember why she had Intolerance to Lipitor.  . Fentanyl Itching  . Rosuvastatin Anxiety    Chest tigtness and feeling as if she had heartburn.    Patient Measurements: Height: '5\' 7"'$  (170.2 cm) Weight: 246 lb 4.1 oz (111.7 kg) IBW/kg (Calculated) : 61.6  Vital Signs: Temp: 98.4 F (36.9 C) (11/08 0828) Temp Source: Oral (11/08 0828) BP: 138/74 mmHg (11/08 1200) Pulse Rate: 70 (11/08 1200) Intake/Output from previous day: 11/07 0701 - 11/08 0700 In: 2112.5 [I.V.:1992.5; NG/GT:120] Out: 2000 [Urine:1900; Stool:100] Intake/Output from this shift: Total I/O In: 674 [I.V.:454; NG/GT:120; IV Piggyback:100] Out: -   Labs:  Recent Labs  06/02/15 0524 06/02/15 1616 06/03/15 0539  WBC  --   --  0.8*  HGB  --   --  9.1*  PLT  --   --  33*  CREATININE 0.79 0.75 0.70   Estimated Creatinine Clearance: 87.9 mL/min (by C-G formula based on Cr of 0.7). No results for input(s): VANCOTROUGH, VANCOPEAK, VANCORANDOM, GENTTROUGH, GENTPEAK, GENTRANDOM, TOBRATROUGH, TOBRAPEAK, TOBRARND, AMIKACINPEAK, AMIKACINTROU, AMIKACIN in the last 72 hours.   Microbiology: Recent Results (from the past 720 hour(s))  MRSA PCR Screening     Status: None   Collection Time: 05/16/2015 12:28 AM  Result Value Ref Range Status   MRSA by PCR NEGATIVE NEGATIVE Final    Comment:        The GeneXpert MRSA Assay (FDA approved for NASAL specimens only), is one component of a comprehensive MRSA colonization surveillance program. It is not intended to diagnose MRSA infection nor to guide or monitor treatment for MRSA infections.   Culture, blood (routine x 2)     Status: None   Collection Time: 05/15/2015  1:13 AM  Result Value Ref Range Status   Specimen Description BLOOD RIGHT HAND  Final   Special Requests BOTTLES DRAWN AEROBIC AND ANAEROBIC 3CC  Final   Culture NO GROWTH  5 DAYS  Final   Report Status 05/24/2015 FINAL  Final  Culture, blood (routine x 2)     Status: None   Collection Time: 05/06/2015  1:13 AM  Result Value Ref Range Status   Specimen Description BLOOD RIGHT ASSIST CONTROL  Final   Special Requests BOTTLES DRAWN AEROBIC AND ANAEROBIC 4CC  Final   Culture NO GROWTH 5 DAYS  Final   Report Status 05/24/2015 FINAL  Final  Culture, expectorated sputum-assessment     Status: None   Collection Time: 05/20/15  2:00 PM  Result Value Ref Range Status   Specimen Description EXPECTORATED SPUTUM  Final   Special Requests NONE  Final   Sputum evaluation THIS SPECIMEN IS ACCEPTABLE FOR SPUTUM CULTURE  Final   Report Status 05/20/2015 FINAL  Final  Culture, respiratory (NON-Expectorated)     Status: None   Collection Time: 05/20/15  2:00 PM  Result Value Ref Range Status   Specimen Description EXPECTORATED SPUTUM  Final   Special Requests NONE Reflexed from [id number]  Final   Gram Stain   Final    FEW WBC SEEN FEW GRAM POSITIVE RODS FAIR SPECIMEN - 70-80% WBCS    Culture APPEARS TO BE NORMAL FLORA  Final   Report Status 05/22/2015 FINAL  Final  Culture, bal-quantitative     Status: None   Collection Time: 05/20/15  5:05 PM  Result Value Ref Range Status   Specimen Description BRONCHIAL ALVEOLAR  LAVAGE  Final   Special Requests Normal  Final   Gram Stain   Final    MODERATE WBC SEEN RARE GRAM POSITIVE RODS RARE GRAM POSITIVE COCCI IN CHAINS GOOD SPECIMEN - 80-90% WBCS    Culture APPEARS TO BE NORMAL FLORA  Final   Report Status 05/22/2015 FINAL  Final  Fungus Culture with Smear     Status: None (Preliminary result)   Collection Time: 05/20/15  5:05 PM  Result Value Ref Range Status   Specimen Description SPUTUM  Final   Special Requests Normal  Final   Culture CANDIDA DUBLINIENSIS  Final   Report Status PENDING  Incomplete  C difficile quick scan w PCR reflex     Status: None   Collection Time: 05/29/15  4:50 PM  Result Value Ref Range  Status   C Diff antigen NEGATIVE NEGATIVE Final   C Diff toxin NEGATIVE NEGATIVE Final   C Diff interpretation Negative for C. difficile  Final  Urine culture     Status: None (Preliminary result)   Collection Time: 05/29/15  6:19 PM  Result Value Ref Range Status   Specimen Description URINE, RANDOM  Final   Special Requests NONE  Final   Culture   Final    >=100,000 COLONIES/mL YEAST REPEATING TO CONFIRM ID    Report Status PENDING  Incomplete    Medical History: Past Medical History  Diagnosis Date  . Anxiety   . Arthritis   . COPD (chronic obstructive pulmonary disease) ([place])   . CHF (congestive heart failure) ([place])   . Hyperlipidemia   . Hypertension   . Diabetes mellitus without complication ([place])   . Chronic kidney disease   . Neuromuscular disorder (HCC)     Medications:  Scheduled:  . allopurinol  100 mg Oral Daily  . amLODipine  5 mg Oral Daily  . antiseptic oral rinse  7 mL Mouth Rinse QID  . budesonide (PULMICORT) nebulizer solution  0.5 mg Nebulization BID  . chlorhexidine gluconate  15 mL Mouth Rinse BID  . famotidine  20 mg Per Tube BID  . filgrastim  300 mcg Subcutaneous Daily  . fluconazole  200 mg Oral Daily  . free water  200 mL Per Tube 3 times per day  . insulin aspart  0-15 Units Subcutaneous 6 times per day  . insulin detemir  15 Units Subcutaneous Q24H  . ipratropium-albuterol  3 mL Nebulization Q6H  . losartan  100 mg Oral Daily  . metoprolol  100 mg Per Tube BID  . pregabalin  50 mg Per Tube TID   Infusions:  . dexmedetomidine 0.6 mcg/kg/hr (06/03/15 0859)  . dextrose 75 mL/hr at 06/03/15 0201  . feeding supplement (VITAL HIGH PROTEIN)     Assessment: 69 y/o Female, day 15 of hospital stay. Patient  with SCLC and currently intubated. Urine cultures from 11/3 growing yeast. Pharmacy consulted for dosing of fluconazole.     Plan:  Will continue fluconazole '200mg'$  po daily based on guideline dosing for candidiasis of urogenital site.  Duration of treatment is 2 weeks.  Pharmacy will continue to monitor patients labs and renal function and make adjustments as needed.   Ulice Dash, PharmD

## 2015-06-03 NOTE — Progress Notes (Signed)
RN called into room by NT [facility]. NT had just turned patient to clean patient up. patient desating with o2 sats 79% and increased work of breathing. Patient restless and agitated.  Set patient up to 45 degrees in bed and suctioned patient's ET tube with minimal secretions suctioned after giving 100% o2.  STach rate 130's.  RN called Jenny Reichmann, RT to come assess patient. '1mg'$  versed given PRN. John, RRT in room and switched patient from spontaneous back to rate control on ventilator. Breath sounds auscultated and patient sounded very tight and coarse throughout all fields.  Patient now sating 94% on 35% fio2 via ventilator and Stach rate 1 teens per cardiac monitor. Continuing to monitor patient.

## 2015-06-03 NOTE — Progress Notes (Signed)
Initial Nutrition Assessment     INTERVENTION:   EN: received verbal order from MD Mungal to restart TF. Recommend restarting Vital High Protein, goal of 60 ml/hr, as previously ordered. Continue to assess   NUTRITION DIAGNOSIS:   Inadequate oral intake related to acute illness as evidenced by NPO status.  GOAL:   Provide needs based on ASPEN/SCCM guidelines  MONITOR:    (Energy Intake, Anthropometrics, Electrolyte/Renal Profile, Digestive System, Electrolyte/Renal Profile)  REASON FOR ASSESSMENT:   Consult Enteral/tube feeding initiation and management  ASSESSMENT:    Pt remains alert on vent, pt now on neutropenic precautions  Past Medical History  Diagnosis Date  . Anxiety   . Arthritis   . COPD (chronic obstructive pulmonary disease) ([place])   . CHF (congestive heart failure) ([place])   . Hyperlipidemia   . Hypertension   . Diabetes mellitus without complication ([place])   . Chronic kidney disease   . Neuromuscular disorder ([place])    Diet Order:   NPO  Digestive System: OG tube now in correct position, flexiseal in place  Electrolyte and Renal Profile:  Recent Labs Lab 05/31/15 0445  06/02/15 0524 06/02/15 1616 06/03/15 0539  BUN 72*  < > 40* 36* 29*  CREATININE 0.90  < > 0.79 0.75 0.70  NA 153*  < > 153* 154* 149*  K 4.6  < > 4.0 4.0 3.6  MG 1.7  --  1.5*  --  1.6*  PHOS 4.0  --  3.1  --   --   < > = values in this interval not displayed. Glucose Profile:  Recent Labs  06/03/15 0800 06/03/15 0955 06/03/15 1200  GLUCAP 102* 112* 114*   Meds: D5 at 75 ml/hr, ss novolog, levemir  Height:   Ht Readings from Last 1 Encounters:  05/10/2015 '5\' 7"'$  (1.702 m)    Weight:   Wt Readings from Last 1 Encounters:  06/03/15 246 lb 4.1 oz (111.7 kg)    BMI:  Body mass index is 38.56 kg/(m^2).  Estimated Nutritional Needs:   Kcal:  5697-9480 kcals (11-14 kcals/kg) using current wt of 105.6 kg  Protein:  92-122 g (1.5-2.0 g/kg)   Fluid:  1525-1830 mL  (25-30 ml/kg)   EDUCATION NEEDS:   No education needs identified at this time  [place], [place], LDN [phone] Pager

## 2015-06-03 NOTE — Progress Notes (Signed)
Pharmacy Consult for Electrolyte Monitoring   Allergies  Allergen Reactions  . Atorvastatin Other (See Comments)    Does not remember why she had Intolerance to Lipitor.  . Fentanyl Itching  . Rosuvastatin Anxiety    Chest tigtness and feeling as if she had heartburn.    Patient Measurements: Height: '5\' 7"'$  (170.2 cm) Weight: 246 lb 4.1 oz (111.7 kg) IBW/kg (Calculated) : 61.6   Vital Signs: Temp: 97.6 F (36.4 C) (11/08 0500) Temp Source: Oral (11/08 0500) BP: 125/99 mmHg (11/08 0300) Pulse Rate: 71 (11/08 0300) Intake/Output from previous day: 11/07 0701 - 11/08 0700 In: 1196.7 [I.V.:1076.7; NG/GT:120] Out: 2000 [Urine:1900; Stool:100] Intake/Output from this shift: Total I/O In: 293.8 [I.V.:173.8; NG/GT:120] Out: 900 [Urine:900]  Labs: No results for input(s): WBC, HGB, HCT, PLT, APTT, INR in the last 72 hours.   Recent Labs  06/02/15 0524 06/02/15 1616 06/03/15 0539  NA 153* 154* 149*  K 4.0 4.0 3.6  CL 119* 117* 115*  CO2 '31 30 30  '$ GLUCOSE 133* 149* 110*  BUN 40* 36* 29*  CREATININE 0.79 0.75 0.70  CALCIUM 8.1* 8.2* 7.9*  MG 1.5*  --  1.6*  PHOS 3.1  --   --    Estimated Creatinine Clearance: 87.9 mL/min (by C-G formula based on Cr of 0.7).    Recent Labs  06/02/15 1955 06/02/15 2356 06/03/15 0350  GLUCAP 112* 103* 108*    Medical History: Past Medical History  Diagnosis Date  . Anxiety   . Arthritis   . COPD (chronic obstructive pulmonary disease) ([place])   . CHF (congestive heart failure) ([place])   . Hyperlipidemia   . Hypertension   . Diabetes mellitus without complication ([place])   . Chronic kidney disease   . Neuromuscular disorder (HCC)     Medications:  Scheduled:  . allopurinol  100 mg Oral Daily  . amLODipine  5 mg Oral Daily  . antiseptic oral rinse  7 mL Mouth Rinse QID  . budesonide (PULMICORT) nebulizer solution  0.5 mg Nebulization BID  . chlorhexidine gluconate  15 mL Mouth Rinse BID  . famotidine  20 mg Per Tube BID   . fluconazole  200 mg Oral Daily  . insulin aspart  0-15 Units Subcutaneous 6 times per day  . insulin detemir  15 Units Subcutaneous Q24H  . ipratropium-albuterol  3 mL Nebulization Q6H  . losartan  100 mg Oral Daily  . metoprolol  100 mg Per Tube BID  . pregabalin  50 mg Per Tube TID   Infusions:  . dexmedetomidine 0.6 mcg/kg/hr (06/03/15 0307)  . dextrose 75 mL/hr at 06/03/15 0201    Assessment: Patient started on 1/2NS @ 73m on 11/2. Patient's sodium remains elevated but stable.   Plan:  No additional supplementation warranted at this time.  Will f/u AM labs.   Pharmacy will continue to monitor labs and replace electrolytes as needed.   11/5 AM electrolyte panel WNL except for Na+ still elevated. No indication for replacement. Will f/u BMP in AM.  11/6 AM electrolyte panel WNL except for Na+ still elevated. No indication for replacement. Will f/u BMP, Mg, and PO4 in AM  11/7 Electrolyte panel WNL execpt. Na+ still a bit elevated and Mg 1.5 Will order magnesium sulfate 2 grams IV x1. Recheck BMP and Mg in AM.  1108 AM Magnesium 1.6, will order 4 gm IV x 1 and recheck electrolytes with AM labs.  Oda Lansdowne A. CL[place] PFloridaD. Clinical Pharmacist  06/03/2015

## 2015-06-03 NOTE — Progress Notes (Signed)
[facility] [place] NAME: Emily Pratt    MR#:  [id number]  DATE OF BIRTH:  Jul 18, 1946  SUBJECTIVE:  CHIEF COMPLAINT: Pt is awake and alert, not on sedation, on vent. REVIEW OF SYSTEMS:   Limited ROS.  No fever or chills. No chest pain, palpitation, SOB. No nausea, vomiting, but has watery stool.  DRUG ALLERGIES:   Allergies  Allergen Reactions  . Atorvastatin Other (See Comments)    Does not remember why she had Intolerance to Lipitor.  . Fentanyl Itching  . Rosuvastatin Anxiety    Chest tigtness and feeling as if she had heartburn.    VITALS:  Blood pressure 136/68, pulse 71, temperature 97.6 F (36.4 C), temperature source Oral, resp. rate 17, height '5\' 7"'$  (1.702 m), weight 111.7 kg (246 lb 4.1 oz), SpO2 96 %.  PHYSICAL EXAMINATION:  GENERAL:  69 y.o.-year-old patient lying in the bed, awake, on vent. Obese. EYES: Pupils equal, round, reactive to light and accommodation. No scleral icterus.  HEENT: Head atraumatic, normocephalic. ETT in place NECK:  Supple, no jugular venous distention. No thyroid enlargement, no tenderness.  LUNGS: Better air entrance on the right,  diminished breath sounds on the right , no wheezing or rhonchi , good air movements on the left , on ventilator support. CARDIOVASCULAR: S1, S2 normal. No murmurs, rubs, or gallops.  ABDOMEN: Soft, nontender, distended. Bowel sounds slugish. No organomegaly or mass.  EXTREMITIES: +1 pedal edema, no cyanosis, or clubbing.  NEUROLOGIC: CN 2-12 intact, strength 4/5,  Follows limited commands PSYCHIATRIC: awake and alert. SKIN: Sacral decubitus ulcer    LABORATORY PANEL:   CBC  Recent Labs Lab 06/03/15 0539  WBC 0.8*  HGB 9.1*  HCT 27.8*  PLT 33*   ------------------------------------------------------------------------------------------------------------------  Chemistries   Recent Labs Lab 05/27/15 1457  06/03/15 0539  NA 151*  < > 149*   K 3.9  < > 3.6  CL 116*  < > 115*  CO2 28  < > 30  GLUCOSE 233*  < > 110*  BUN 67*  < > 29*  CREATININE 0.97  < > 0.70  CALCIUM 8.3*  < > 7.9*  MG  --   < > 1.6*  AST 35  --   --   ALT 49  --   --   ALKPHOS 43  --   --   BILITOT 0.8  --   --   < > = values in this interval not displayed. ------------------------------------------------------------------------------------------------------------------  Cardiac Enzymes No results for input(s): TROPONINI in the last 168 hours. ------------------------------------------------------------------------------------------------------------------  RADIOLOGY:  Dg Abd 1 View  06/02/2015  CLINICAL DATA:  Status post OG tube placement. EXAM: ABDOMEN - 1 VIEW COMPARISON:  None. FINDINGS: OG tube is in place with the tip in the stomach. The tube could be advanced 3-4 cm for better positioning. IMPRESSION: As above. Electronically Signed   By: Inge Rise M.D.   On: 06/02/2015 15:21   Dg Abd 1 View  06/02/2015  CLINICAL DATA:  Orogastric tube placement. EXAM: ABDOMEN - 1 VIEW COMPARISON:  Earlier today at 0800 hours. FINDINGS: Motion and patient body habitus degradation. Nasogastric tube terminates at the body of the stomach. The side-port may be above the gastroesophageal junction. No gross free intraperitoneal air. Partial right hemi thorax opacification, incompletely imaged. IMPRESSION: Degraded exam, as detailed above. Nasogastric terminating at the body of the stomach. Electronically Signed   By: Abigail Miyamoto M.D.   On:  06/02/2015 09:28   Dg Abd 1 View  06/02/2015  CLINICAL DATA:  NG tube placement . EXAM: ABDOMEN - 1 VIEW COMPARISON:  05/30/2015 . FINDINGS: NG tube tip noted at the level of the gastroesophageal junction. More distal placement should be considered. Persistent bowel distention. No free air . IMPRESSION: NG tube tip noted at the level of the gastroesophageal junction. More distal placement should be considered.These results will  be called to the ordering clinician or representative by the Radiologist Assistant, and communication documented in the PACS or zVision Dashboard. Electronically Signed   By: Marcello Moores  Register   On: 06/02/2015 08:19   [place] Chest  06/02/2015  CLINICAL DATA:  Pleural effusion.  Lung cancer. EXAM: CHEST ULTRASOUND COMPARISON:  Chest x-ray 06/02/2015. FINDINGS: Moderate right-sided pleural effusion noted. IMPRESSION: Moderate right pleural effusion . Electronically Signed   By: Marcello Moores  Register   On: 06/02/2015 10:05   Dg Chest Port 1 View  06/02/2015  CLINICAL DATA:  69 year old female with a history of small cell lung cancer EXAM: PORTABLE CHEST 1 VIEW COMPARISON:  Multiple prior plain film, including 05/31/2015, limb 10/2014, 05/29/2015, CT chest 05/20/2015 FINDINGS: Cardiomediastinal silhouette obscured by overlying lung/ pleural disease. Unchanged position of endotracheal tube, terminating suitably above the carina, 4.5 cm. Unchanged gastric tube, terminating out of the field of view. Unchanged right upper extremity PICC, which terminates at the level the right mainstem bronchus. Overlying EKG leads and ventilator tubing. Atherosclerosis of the aortic arch. Dense opacity of the right lung persists, with obscuration the right hemidiaphragm, right heart borders, and the majority of the right lung. Minimal maintained aeration at the right apex. Airspace opacity at the left base persists, with similar aeration to the prior. No pneumothorax. IMPRESSION: Similar appearance of dense right-sided airspace opacity, likely a combination of lung consolidation, atelectasis, tumor tissue, and pleural effusion. Unchanged position of support apparatus, as above. Airspace disease at the left base, similar to the comparison, likely combination of atelectasis, edema, consolidation. Small left pleural effusion not excluded. Atherosclerosis. Signed, Dulcy Fanny. Earleen Newport, DO [facility]  Radiology Electronically Signed   By: Corrie Mckusick D.O.   On: 06/02/2015 08:23    EKG:   Orders placed or performed during the hospital encounter of 05/07/2015  . EKG 12-Lead  . EKG 12-Lead    ASSESSMENT AND PLAN:   1. Acute on chronic respiratory failure with hypoxia and Hypercapnia,  - Multifactorial due to large right-sided pleural effusion/ CHF/ COPD / mass- SCLC - Pulmonology and oncology is following, failed multiple extubation attempts.  - Continue nebulizer, steroids, antibiotics. Oncology initiated chemotherapy October 31st 2016 with cisplatin and etoposide with hopes to shrink the mass. Timing of shrinkage  is unclear,  chest x-ray does not show any improvement. Appreciate palliative care input , ENT saw patient and discussed this oncologist , no tracheostomy tube is recommended at this time but revisit issue in about 1 week. Try to wean off vent today per Dr. Stevenson Clinch. chest [place]: moderate right side pleural effusion.  2. Acute Congestive heart failure: Mixed systolic and diastolic  - Echo 74/16 shows ejection fraction 30 -35% with abnormal filling - Continue metoprolol per cardiology's recommendations , resumed losartan.  3. Acute on chronic kidney disease:  - Due to ATN, hypovolemia and hypotension, Cr. Is normal and BUN is improving. - Nephrology signed off. Patient was restarted on losartan.  Continue 1/2 NS iv, F/u BMP.  4. Essential hypertension:  continue metoprolol and losartan, norvasc, better  blood pressure control  5. Diabetes mellitus type 2: Hold home medications Continue levemir 15 unit HS and sliding scale, blood glucose is controlled. Now holding tube feed due to mild ileus.  6.  Small cell lung cancer, likely stage IV per oncology - Status post bronchoscopy 10/25, pathology show small cell lung cancer - Long-term history of heavy smoking - pathology reports were discussed with the patient and sister in the past.   - Oncology initiated chemotherapy ,  PICC  line for chemotherapy is placed 05/26/2015- Radiation oncology has been consulted and patient may benefit from radiology oncology intervention as well, since she is intubated. It might be difficult to perform. Following patient's platelet count closely with chemotherapy chest [place]: moderate right side pleural effusion.   7. Postobstructive pneumonia versus atelectasis - Blood cultures negative to date, respiratory, BAL, AFB normal flora - Discontinue vancomycin and Zosyn, completed 7 day Levaquin course  8. Arrhythmia, SVT versus paroxysmal atrial fibrillation during bronchoscopy, now in sinus rhythm, continue metoprolol per cardiology's recommendations  9 thrombocytopenia, following closely, may need transfusions with chemotherapy for lung ca. Worsening. F/u CBC and  Oncologist.  * Neutropenia. Need neupogen. F/u oncologist and CBC.  10. Hypernatremia, improving. continue 1/2 NS iv. F/u BMP.  11. UTI  Ur cx showed yeast,  Discontinue Rocephin IV,  Diflucan PTD.  12. Ileus   Hold feeding and f/u surgical consult- repeat xray: mild ileus.  * Hypomagnesemia. Mag iv, f/u level.  D/w Dr. Stevenson Clinch. D/w CM, not a candidate for LTAC. Greater than 50% time was spent on coordination of care and face-to-face counseling.  CODE STATUS: Full code  TOTAL CRITICAL CARE TIME TAKING CARE OF THIS PATIENT: 38 minutes.    Demetrios Loll M.D on 06/03/2015 at 8:16 AM  Between 7am to 6pm - Pager - [phone] After 6pm go to [email or web address] - password [email or web address] [facility]  Office  [phone]  CC: Primary care physician; Dagoberto Ligas, MD

## 2015-06-03 NOTE — Progress Notes (Signed)
Resting well.  Arousable and communicating with RN. NSR per cardiac monitor. No respiratory distress at this time.  Report given to [facility], Therapist, sports.

## 2015-06-03 NOTE — Evaluation (Signed)
Physical Therapy Evaluation Patient Details Name: Emily Pratt MRN: [id number] DOB: 04-26-1946 Today's Date: 06/03/2015   History of Present Illness  presented to ER with SOB, requiring BiPAP upon arrival; admitted with acute on chronic respiratory failure with hypercapnia, requiring intubation 10/25 (and subsequent failure to wean).  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  Has now completed once course of chemo (10/31) during hospitalization.  Pending trach and PEG placement when medically appropriate (thrombocytopenia currently contraindicating).  Clinical Impression  Upon evaluation, patient alert and oriented to basic information; non-verbal secondary to orotracheal intubation.  Able to correctly answer questions, provide information (assisted by sister at bedside) via head nods, hand gestures and writing notes.  Bilat UE strength and ROM grossly WFL (limited to shoulder height secondary to central line placement); LE strength and ROM grossly 3+ to 4+/5 throughout with good, active effort with all activities.  Mobility deferred at this time secondary to patient currently orally intubated.  Will continue mobility assessment as patient medically appropriate and cleared by physician.  Will plan to issue HEP next session for use outside of formal therapy sessions. Would benefit from skilled PT to address above deficits and promote optimal return to PLOF; recommend transition to STR vs. LTAC upon additional mobility assessment and respiratory status upon discharge from acute hospitalization.     Follow Up Recommendations LTACH;SNF (pending additional medical clearance and mobility assessment)    Equipment Recommendations       Recommendations for Other Services       Precautions / Restrictions Precautions Precautions: Fall Precaution Comments: R UE PICC Restrictions Weight Bearing Restrictions: No      Mobility  Bed Mobility               General bed mobility comments: deferred secondary to ventilatory status  Transfers                 General transfer comment: deferred secondary to ventilatory status  Ambulation/Gait             General Gait Details: deferred secondary to ventilatory status  Stairs            Wheelchair Mobility    Modified Rankin (Stroke Patients Only)       Balance                                             Pertinent Vitals/Pain Pain Assessment: No/denies pain    Home Living Family/patient expects to be discharged to:: Private residence Living Arrangements: Spouse/significant other Available Help at Discharge: Family Type of Home: House Home Access: Stairs to enter Entrance Stairs-Rails: Right Entrance Stairs-Number of Steps: 3-4 Home Layout: One level Home Equipment: Environmental consultant - 2 wheels;Cane - single point      Prior Function Level of Independence: Independent with assistive device(s)         Comments: Indep with intermittent use of SPC vs. RW ("depending on how her legs felt"); denies fall history     Hand Dominance        Extremity/Trunk Assessment   Upper Extremity Assessment: Overall WFL for tasks assessed           Lower Extremity Assessment: Overall WFL for tasks assessed (proximal strength 3+/5, knees and ankles 4 to 4+/5; good ROM)         Communication  Communication:  (ventilated; correctly communicates with head nod, hand gestures and writing)  Cognition Arousal/Alertness: Awake/alert Behavior During Therapy: WFL for tasks assessed/performed Overall Cognitive Status: Within Functional Limits for tasks assessed                      General Comments      Exercises Other Exercises Other Exercises: Bilat LE supine therex, 1x12, AROM for LE strength/endurance: ankle pumps, quad sets, SAQs, hip abduct/adduct, heel slides (with resisted extension), assisted SLR.      Assessment/Plan    PT  Assessment Patient needs continued PT services  PT Diagnosis Difficulty walking;Generalized weakness   PT Problem List Decreased strength;Decreased range of motion;Decreased activity tolerance;Decreased balance;Decreased mobility;Decreased knowledge of use of DME;Decreased safety awareness;Decreased knowledge of precautions;Cardiopulmonary status limiting activity  PT Treatment Interventions DME instruction;Gait training;Stair training;Functional mobility training;Therapeutic exercise;Therapeutic activities;Balance training;Patient/family education   PT Goals (Current goals can be found in the Care Plan section) Acute Rehab PT Goals Patient Stated Goal: patient unable to verbalize secondary to vent PT Goal Formulation: With patient/family Time For Goal Achievement: 06/22/2015 Potential to Achieve Goals: Good    Frequency Min 2X/week   Barriers to discharge        Co-evaluation               End of Session   Activity Tolerance: Patient tolerated treatment well Patient left: in bed;with call bell/phone within reach;with family/visitor present           Time: 5852-7782 PT Time Calculation (min) (ACUTE ONLY): 17 min   Charges:   PT Evaluation $Initial PT Evaluation Tier I: 1 Procedure PT Treatments $Therapeutic Exercise: 8-22 mins   PT G Codes:        Ellison Hughs 2015/06/20, 2:53 PM

## 2015-06-04 DIAGNOSIS — J90 Pleural effusion, not elsewhere classified: Secondary | ICD-10-CM | POA: Insufficient documentation

## 2015-06-04 DIAGNOSIS — J948 Other specified pleural conditions: Secondary | ICD-10-CM

## 2015-06-04 DIAGNOSIS — D61818 Other pancytopenia: Secondary | ICD-10-CM

## 2015-06-04 DIAGNOSIS — N179 Acute kidney failure, unspecified: Secondary | ICD-10-CM

## 2015-06-04 LAB — GLUCOSE, CAPILLARY
Glucose-Capillary: 142 mg/dL — ABNORMAL HIGH (ref 65–99)
Glucose-Capillary: 157 mg/dL — ABNORMAL HIGH (ref 65–99)
Glucose-Capillary: 138 mg/dL — ABNORMAL HIGH (ref 65–99)
Glucose-Capillary: 151 mg/dL — ABNORMAL HIGH (ref 65–99)

## 2015-06-04 LAB — CBC
HEMATOCRIT: 26.7 % — AB (ref 35.0–47.0)
HEMOGLOBIN: 8.8 g/dL — AB (ref 12.0–16.0)
MCH: 29.9 pg (ref 26.0–34.0)
MCHC: 32.9 g/dL (ref 32.0–36.0)
MCV: 91 fL (ref 80.0–100.0)
Platelets: 25 10*3/uL — CL (ref 150–440)
RBC: 2.93 MIL/uL — ABNORMAL LOW (ref 3.80–5.20)
RDW: 17.7 % — AB (ref 11.5–14.5)
WBC: 0.6 10*3/uL — CL (ref 3.6–11.0)

## 2015-06-04 LAB — URINE CULTURE

## 2015-06-04 LAB — BASIC METABOLIC PANEL
ANION GAP: 4 — AB (ref 5–15)
BUN: 25 mg/dL — AB (ref 6–20)
CALCIUM: 8 mg/dL — AB (ref 8.9–10.3)
CO2: 31 mmol/L (ref 22–32)
Chloride: 115 mmol/L — ABNORMAL HIGH (ref 101–111)
Creatinine, Ser: 0.68 mg/dL (ref 0.44–1.00)
GFR calc Af Amer: >60 mL/min (ref >60)
GFR calc non Af Amer: >60 mL/min (ref >60)
GLUCOSE: 140 mg/dL — AB (ref 65–99)
Potassium: 3.5 mmol/L (ref 3.5–5.1)
Sodium: 150 mmol/L — ABNORMAL HIGH (ref 135–145)

## 2015-06-04 LAB — PHOSPHORUS: Phosphorus: 2.8 mg/dL (ref 2.5–4.6)

## 2015-06-04 LAB — MAGNESIUM: MAGNESIUM: 1.8 mg/dL (ref 1.7–2.4)

## 2015-06-04 MED ORDER — FREE WATER
200.0000 mL | Status: DC
  Administered 2015-06-04 – 2015-06-06 (×11): 200 mL

## 2015-06-04 NOTE — Progress Notes (Signed)
RN entered room at 1000 and patient restless with increased work of breathing, o2 sats 94% and heart rate 140-150 Stach. RN called Hardie Pulley, RRT to come and place patient back in rate control. Dr. Stevenson Clinch walked by room and RN made MD aware of situation and MD came in to assess and told RN to give patient 48mg of fentanyl. Patient calmed down shortly after fentanyl was given and after being switched back to rate control on ventilator. Patient now at this time alert watching tv. No work of breathing with o2 sats 96% and Stach 1 teens-120's. Continuing to monitor.

## 2015-06-04 NOTE — Progress Notes (Signed)
Palliative Medicine Inpatient Consult Follow Up Note   Name: Emily Pratt Date: 06/04/2015 MRN: [id number]  DOB: [birth year]  Referring Physician: Demetrios Loll, MD  Palliative Care consult requested for this 69 y.o. 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 The patient is being followed at a distance by [facility] ---in the event that things do not go well and more involvement is needed.  I was asked by Dr. Bridgett Larsson to see pt today, since she is not able to get a trache currently because of low platelets.  TODAY'S DISCUSSIONS: 1.  I spoke with Dr. Bridgett Larsson early today.  He indicated that pt might not be able to get a trach due to her low platelet count and therefore, she might be moving to a comfort care direction.  2.  I talked with Dr. Stevenson Clinch, and he feels this low platelet count is the nadir related to chemo and that this should bounce back and then she can get a trache.  She does want a trache.  3.  I was going to try to address the full code status, but I do not want to 'scare'' the patient and communicating a complex issue with her on the ventilator is not the best scenario for discussing this.  She nods yes but she would not be able to easily ask questions while on the vent.  I was going to call her husband --BUT THERE IS NO NUMBER FOR THE HUSBAND IN THE DEMOGRAPHICS.  Other family members are listed, but he is the next of kin.    4.  I will be out until Nov 15th, at which time I will check back on pt and see if more palliative involvement is needed at that time.  I will continue to keep up with pts status.   IMRESSION: Acute on chronic respiratory failure --multifactorial etiology due to right lung collapes, new metastatic small cell cancer of the lung, & COPD COPD DM2 Thrombocytopenia CKD  Suspected post obstructive Pneumonia Hypernatremia Mild  Malnutritio Thrombocytopenia  REVIEW OF SYSTEMS:  Patient is not able to provide ROS since she is intubated  CODE STATUS: Full code   PAST MEDICAL HISTORY: Past Medical History  Diagnosis Date  . Anxiety   . Arthritis   . COPD (chronic obstructive pulmonary disease) ([place])   . CHF (congestive heart failure) ([place])   . Hyperlipidemia   . Hypertension   . Diabetes mellitus without complication ([place])   . Chronic kidney disease   . Neuromuscular disorder ([place])     PAST SURGICAL HISTORY:  Past Surgical History  Procedure Laterality Date  . Joint replacement Bilateral 2006    both knees  . Ankle surgery      Vital Signs: BP 130/69 mmHg  Pulse 88  Temp(Src) 98.4 F (36.9 C) (Oral)  Resp 17  Ht '5\' 7"'$  (1.702 m)  Wt 110.6 kg (243 lb 13.3 oz)  BMI 38.18 kg/m2  SpO2 99% Filed Weights   06/02/15 0527 06/03/15 0500 06/04/15 0800  Weight: 114.6 kg (252 lb 10.4 oz) 111.7 kg (246 lb 4.1 oz) 110.6 kg (243 lb 13.3 oz)    Estimated body mass index is 38.18 kg/(m^2) as calculated from the following:   Height as of this encounter: '5\' 7"'$  (1.702 m).   Weight as of this encounter: 110.6 kg (243 lb 13.3 oz).  PHYSICAL EXAM: NAD Lying in Step Down  unit intubated with NG tube --sleeping But wakens easily and nods Neck w/o JVD Hrt rrr no m Lungs sound cta anteriorly Abd soft and NT Skin with pigment changes distally on legs.   LABS: CBC:    Component Value Date/Time   WBC 0.6* 06/04/2015 0514   HGB 8.8* 06/04/2015 0514   HCT 26.7* 06/04/2015 0514   PLT 25* 06/04/2015 0514   MCV 91.0 06/04/2015 0514   NEUTROABS 0.2* 06/03/2015 0539   LYMPHSABS 0.5* 06/03/2015 0539   MONOABS 0.0* 06/03/2015 0539   EOSABS 0.0 06/03/2015 0539   BASOSABS 0.0 06/03/2015 0539   Comprehensive Metabolic Panel:    Component Value Date/Time   NA 150* 06/04/2015 0514   NA 143 11/14/2014   K 3.5 06/04/2015 0514   CL 115* 06/04/2015 0514   CO2 31 06/04/2015 0514   BUN 25* 06/04/2015 0514   BUN 21  11/14/2014   CREATININE 0.68 06/04/2015 0514   CREATININE 17.0* 11/14/2014   GLUCOSE 140* 06/04/2015 0514   CALCIUM 8.0* 06/04/2015 0514   AST 35 05/27/2015 1457   ALT 49 05/27/2015 1457   ALKPHOS 43 05/27/2015 1457   BILITOT 0.8 05/27/2015 1457   PROT 5.3* 05/27/2015 1457   ALBUMIN 2.6* 05/27/2015 1457    More than 50% of the visit was spent in counseling/coordination of care: YES  Time Spent: 15 min

## 2015-06-04 NOTE — Progress Notes (Addendum)
[place] [facility] [place] NAME: Emily Pratt    MR#:  [id number]  DATE OF BIRTH:  03-17-1946  SUBJECTIVE:  CHIEF COMPLAINT: Pt is awake and alert, not on sedation, on vent. REVIEW OF SYSTEMS:   Limited ROS.  No fever or chills. No chest pain, palpitation, SOB. No nausea, vomiting.  DRUG ALLERGIES:   Allergies  Allergen Reactions  . Atorvastatin Other (See Comments)    Does not remember why she had Intolerance to Lipitor.  . Fentanyl Itching  . Rosuvastatin Anxiety    Chest tigtness and feeling as if she had heartburn.    VITALS:  Blood pressure 120/55, pulse 77, temperature 98.3 F (36.8 C), temperature source Oral, resp. rate 18, height '5\' 7"'$  (1.702 m), weight 110.6 kg (243 lb 13.3 oz), SpO2 100 %.  PHYSICAL EXAMINATION:  GENERAL:  69 y.o.-year-old patient lying in the bed, awake, on vent. Obese. EYES: Pupils equal, round, reactive to light and accommodation. No scleral icterus.  HEENT: Head atraumatic, normocephalic. ETT in place NECK:  Supple, no jugular venous distention. No thyroid enlargement, no tenderness.  LUNGS: Better air entrance on the right,  diminished breath sounds on the right , no wheezing or rhonchi , good air movements on the left , on ventilator support. CARDIOVASCULAR: S1, S2 normal. No murmurs, rubs, or gallops.  ABDOMEN: Soft, nontender, distended. Bowel sounds slugish. No organomegaly or mass.  EXTREMITIES: +1 pedal edema, no cyanosis, or clubbing.  NEUROLOGIC: CN 2-12 intact, strength 4/5,  Follows limited commands PSYCHIATRIC: awake and alert. SKIN: Sacral decubitus ulcer    LABORATORY PANEL:   CBC  Recent Labs Lab 06/04/15 0514  WBC 0.6*  HGB 8.8*  HCT 26.7*  PLT 25*   ------------------------------------------------------------------------------------------------------------------  Chemistries   Recent Labs Lab 06/04/15 0514  NA 150*  K 3.5  CL 115*  CO2 31  GLUCOSE 140*  BUN  25*  CREATININE 0.68  CALCIUM 8.0*  MG 1.8   ------------------------------------------------------------------------------------------------------------------  Cardiac Enzymes No results for input(s): TROPONINI in the last 168 hours. ------------------------------------------------------------------------------------------------------------------  RADIOLOGY:  Dg Abd 1 View  06/02/2015  CLINICAL DATA:  Status post OG tube placement. EXAM: ABDOMEN - 1 VIEW COMPARISON:  None. FINDINGS: OG tube is in place with the tip in the stomach. The tube could be advanced 3-4 cm for better positioning. IMPRESSION: As above. Electronically Signed   By: Inge Rise M.D.   On: 06/02/2015 15:21   [place] Chest  06/02/2015  CLINICAL DATA:  Pleural effusion.  Lung cancer. EXAM: CHEST ULTRASOUND COMPARISON:  Chest x-ray 06/02/2015. FINDINGS: Moderate right-sided pleural effusion noted. IMPRESSION: Moderate right pleural effusion . Electronically Signed   By: Marcello Moores  Register   On: 06/02/2015 10:05    EKG:   Orders placed or performed during the hospital encounter of 05/22/2015  . EKG 12-Lead  . EKG 12-Lead    ASSESSMENT AND PLAN:   1. Acute on chronic respiratory failure with hypoxia and Hypercapnia,  - Multifactorial due to large right-sided pleural effusion/ CHF/ COPD / mass- SCLC - Pulmonology and oncology is following, failed multiple extubation attempts.  - Continue nebulizer, steroids, antibiotics. Oncology initiated chemotherapy October 31st 2016 with cisplatin and etoposide with hopes to shrink the mass. Timing of shrinkage  is unclear,  chest x-ray does not show any improvement. Appreciate palliative care input , ENT saw patient and discussed this oncologist , no tracheostomy tube is recommended at this time but revisit issue in about 1 week.  Try to wean off vent daily per Dr. Stevenson Clinch. chest [place]: moderate right side pleural effusion.  2. Acute Congestive heart failure: Mixed systolic and diastolic   - Echo 31/43 shows ejection fraction 30 -35% with abnormal filling - Continue metoprolol per cardiology's recommendations , resumed losartan.  3. Acute on chronic kidney disease:  - Due to ATN, hypovolemia and hypotension, improved. - Nephrology signed off. Patient was restarted on losartan.  Continue 1/2 NS iv, F/u BMP.  4. Essential hypertension:  continue metoprolol and losartan, norvasc, better blood pressure control  5. Diabetes mellitus type 2: Hold home medications Continue levemir 15 unit HS and sliding scale, blood glucose is controlled. Now holding tube feed due to mild ileus.  6.  Small cell lung cancer, likely stage IV per oncology - Status post bronchoscopy 10/25, pathology show small cell lung cancer - Long-term history of heavy smoking - pathology reports were discussed with the patient and sister in the past.   - Oncology initiated chemotherapy ,  PICC line for chemotherapy is placed 05/26/2015- Radiation oncology has been consulted and patient may benefit from radiology oncology intervention as well, since she is intubated. It might be difficult to perform. Following patient's platelet count closely with chemotherapy chest [place]: moderate right side pleural effusion.   7. Postobstructive pneumonia versus atelectasis - Blood cultures negative to date, respiratory, BAL, AFB normal flora - Discontinued vancomycin and Zosyn, completed 7 day Levaquin course  8. Arrhythmia, SVT versus paroxysmal atrial fibrillation during bronchoscopy, now in sinus rhythm, continue metoprolol per cardiology's recommendations  9 thrombocytopenia, following closely, may need transfusions with chemotherapy for lung ca. Worsening. F/u CBC and  Oncologist.  * Neutropenia. started neupogen. F/u CBC. Isolation.  10. Hypernatremia, improving. continue 1/2 NS iv. F/u BMP.  11. UTI  Ur cx showed yeast,  Discontinue Rocephin IV, continue Diflucan.  12. Ileus   Hold feeding.repeat xray: mild  ileus.  * Hypomagnesemia. Improved after Mag iv.  D/w Dr. Stevenson Clinch. D/w CM, not a candidate for LTAC. Greater than 50% time was spent on coordination of care and face-to-face counseling.  Palliative care consult.  CODE STATUS: Full code  TOTAL CRITICAL CARE TIME TAKING CARE OF THIS PATIENT: 38 minutes.    Demetrios Loll M.D on 06/04/2015 at 9:25 AM  Between 7am to 6pm - Pager - [phone] After 6pm go to [email or web address] - password [email or web address] [facility]  Office  [phone]  CC: Primary care physician; Dagoberto Ligas, MD

## 2015-06-04 NOTE — Progress Notes (Signed)
Physical Therapy Treatment Patient Details Name: Emily Pratt MRN: [id number] DOB: March 30, 1946 Today's Date: 06/04/2015    History of Present Illness presented to ER with SOB, requiring BiPAP upon arrival; admitted with acute on chronic respiratory failure with hypercapnia, requiring intubation 10/25 (and subsequent failure to wean).  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  Has now completed once course of chemo (10/31) during hospitalization.  Pending trach and PEG placement when medically appropriate (thrombocytopenia currently contraindicating).    PT Comments    Pt able to perform bed exercises this morning, however bed mobility was deferred secondary to pt desaturating during rolling shortly prior to PT arrival. Pt is very receptive to exercises and was able to follow along with the exercise packet that was issued. Due to her mobility deficits, she will continue to benefit from skilled PT in order to return to optimal PLOF. Continue to assess mobility as medically appropriate. All vitals stable throughout session with pt in NAD.    Follow Up Recommendations  LTACH;SNF (pending additional medical clearance and mobility assessment)     Equipment Recommendations       Recommendations for Other Services       Precautions / Restrictions Precautions Precautions: Fall Precaution Comments:  (RUE PICC; bed mobility has caused desaturation) Restrictions Weight Bearing Restrictions: No    Mobility  Bed Mobility Overal bed mobility:  (unable to assess secondary to previous desaturation)             General bed mobility comments: deferred secondary to ventilatory status  Transfers                 General transfer comment: deferred secondary to ventilatory status  Ambulation/Gait             General Gait Details: deferred secondary to ventilatory status   Stairs            Wheelchair Mobility    Modified  Rankin (Stroke Patients Only)       Balance                                    Cognition Arousal/Alertness: Awake/alert Behavior During Therapy: WFL for tasks assessed/performed Overall Cognitive Status: Within Functional Limits for tasks assessed                      Exercises Other Exercises Other Exercises: Pt performed bilateral therex x 12 reps at min A for facilitation of movement. An exercise packet was also issued for the patient to perform exercises with nursing staff when not in PT session. Exercises performed: ankle pumps, quad sets, glute sets, heel slides, SAQ, SLR, and hip abd    General Comments        Pertinent Vitals/Pain Pain Assessment: No/denies pain    Home Living                      Prior Function            PT Goals (current goals can now be found in the care plan section) Acute Rehab PT Goals Patient Stated Goal: unable to verbalize  PT Goal Formulation: With patient Time For Goal Achievement: 06/14/2015 Potential to Achieve Goals: Good Progress towards PT goals: Progressing toward goals    Frequency  Min 2X/week    PT Plan Current plan remains appropriate  Co-evaluation             End of Session   Activity Tolerance: Patient tolerated treatment well Patient left: in bed;with call bell/phone within reach     Time: 1052-1103 PT Time Calculation (min) (ACUTE ONLY): 11 min  Charges:                       G CodesJanyth Contes 06/06/2015, 3:24 PM  Janyth Contes, SPT. [phone]

## 2015-06-04 NOTE — Progress Notes (Signed)
Pharmacy Consult for Electrolyte Monitoring   Allergies  Allergen Reactions  . Atorvastatin Other (See Comments)    Does not remember why she had Intolerance to Lipitor.  . Fentanyl Itching  . Rosuvastatin Anxiety    Chest tigtness and feeling as if she had heartburn.    Patient Measurements: Height: '5\' 7"'$  (170.2 cm) Weight: 246 lb 4.1 oz (111.7 kg) IBW/kg (Calculated) : 61.6   Vital Signs: BP: 125/66 mmHg (11/09 0500) Pulse Rate: 81 (11/09 0500) Intake/Output from previous day: 11/08 0701 - 11/09 0700 In: 2903.8 [I.V.:1898.8; NG/GT:905; IV Piggyback:100] Out: 1750 [Urine:1750] Intake/Output from this shift: Total I/O In: 1215 [I.V.:825; NG/GT:390] Out: 400 [Urine:400]  Labs:  Recent Labs  06/03/15 0539  WBC 0.8*  HGB 9.1*  HCT 27.8*  PLT 33*     Recent Labs  06/02/15 0524 06/02/15 1616 06/03/15 0539 06/04/15 0514  NA 153* 154* 149* 150*  K 4.0 4.0 3.6 3.5  CL 119* 117* 115* 115*  CO2 '31 30 30 31  '$ GLUCOSE 133* 149* 110* 140*  BUN 40* 36* 29* 25*  CREATININE 0.79 0.75 0.70 0.68  CALCIUM 8.1* 8.2* 7.9* 8.0*  MG 1.5*  --  1.6* 1.8  PHOS 3.1  --   --  2.8   Estimated Creatinine Clearance: 87.9 mL/min (by C-G formula based on Cr of 0.68).    Recent Labs  06/03/15 1615 06/03/15 2004 06/03/15 2343  GLUCAP 109* 130* 130*    Medical History: Past Medical History  Diagnosis Date  . Anxiety   . Arthritis   . COPD (chronic obstructive pulmonary disease) ([place])   . CHF (congestive heart failure) ([place])   . Hyperlipidemia   . Hypertension   . Diabetes mellitus without complication ([place])   . Chronic kidney disease   . Neuromuscular disorder ([facility])     Medications:  Scheduled:  . allopurinol  100 mg Oral Daily  . amLODipine  5 mg Oral Daily  . antiseptic oral rinse  7 mL Mouth Rinse QID  . budesonide (PULMICORT) nebulizer solution  0.5 mg Nebulization BID  . chlorhexidine gluconate  15 mL Mouth Rinse BID  . famotidine  20 mg Per Tube BID  .  filgrastim  300 mcg Subcutaneous Daily  . fluconazole  200 mg Oral Daily  . free water  200 mL Per Tube 3 times per day  . insulin aspart  0-15 Units Subcutaneous 6 times per day  . insulin detemir  15 Units Subcutaneous Q24H  . ipratropium-albuterol  3 mL Nebulization Q6H  . losartan  100 mg Oral Daily  . metoprolol  100 mg Per Tube BID  . pregabalin  50 mg Per Tube TID   Infusions:  . dexmedetomidine 0.6 mcg/kg/hr (06/04/15 0500)  . dextrose 75 mL/hr at 06/03/15 1402  . feeding supplement (VITAL HIGH PROTEIN) 1,000 mL (06/03/15 1536)    Assessment: Patient started on 1/2NS @ 83m on 11/2. Patient's sodium remains elevated but stable.   Plan:  No additional supplementation warranted at this time.  Will f/u AM labs.   Pharmacy will continue to monitor labs and replace electrolytes as needed.   11/5 AM electrolyte panel WNL except for Na+ still elevated. No indication for replacement. Will f/u BMP in AM.  11/6 AM electrolyte panel WNL except for Na+ still elevated. No indication for replacement. Will f/u BMP, Mg, and PO4 in AM  11/7 Electrolyte panel WNL execpt. Na+ still a bit elevated and Mg 1.5 Will order magnesium sulfate 2 grams  IV x1. Recheck BMP and Mg in AM.  1108 AM Magnesium 1.6, will order 4 gm IV x 1 and recheck electrolytes with AM labs.  1109 AM electrolytes within normal limits, will recheck with AM labs.   Danyle Boening A. [place], [place].D. Clinical Pharmacist  06/04/2015

## 2015-06-04 NOTE — Clinical Social Work Note (Signed)
Patient currently remains on vent and plan is to receive chemo in the future thus RN CM has noted that patient is not eligible for LTAC. CSW will continue to follow and assist with discharge planning as needed. Shela Leff MSW,LCSW [phone]

## 2015-06-04 NOTE — Progress Notes (Signed)
Alert and follows commands. NSR per cardiac monitor. VSS. Patient remains on a rate on vent with no respiratory distress noted. Tolerating tube feeds at goal rate. Adequate UOP. Husband visited around noon. Report given to Darryl Lent., RN.

## 2015-06-04 NOTE — Progress Notes (Signed)
[place]  [phone] [phone]  ID: Emily Pratt OB: Jun 26, 1946  MR#: [id number]  XTG#:[id number]  Patient Care Team: Dagoberto Ligas, MD as PCP - General (Internal Medicine)  CHIEF COMPLAINT:  Chief Complaint  Patient presents with  . Respiratory Distress    INTERVAL HISTORY: Patient alert and frustrated. But offers no specific complaints. She has completed cycle 1 of Cisplatin and Etoposide on May 28, 2015.   REVIEW OF SYSTEMS:   Review of Systems  Unable to perform ROS: intubated    PAST MEDICAL HISTORY: Past Medical History  Diagnosis Date  . Anxiety   . Arthritis   . COPD (chronic obstructive pulmonary disease) ([place])   . CHF (congestive heart failure) ([place])   . Hyperlipidemia   . Hypertension   . Diabetes mellitus without complication ([place])   . Chronic kidney disease   . Neuromuscular disorder ([place])     PAST SURGICAL HISTORY: Past Surgical History  Procedure Laterality Date  . Joint replacement Bilateral 2006    both knees  . Ankle surgery      FAMILY HISTORY Family History  Problem Relation Age of Onset  . Diabetes Mother   . Cancer Father   . Diabetes Sister   . Stroke Sister        ADVANCED DIRECTIVES:    HEALTH MAINTENANCE: Social History  Substance Use Topics  . Smoking status: Current Every Day Smoker    Types: Cigarettes  . Smokeless tobacco: None     Comment: 0.5 pack /day  . Alcohol Use: No     Colonoscopy:  PAP:  Bone density:  Lipid panel:  Allergies  Allergen Reactions  . Atorvastatin Other (See Comments)    Does not remember why she had Intolerance to Lipitor.  . Fentanyl Itching  . Rosuvastatin Anxiety    Chest tigtness and feeling as if she had heartburn.    Current Facility-Administered Medications  Medication Dose Route Frequency Provider Last Rate Last Dose  . acetaminophen (TYLENOL) tablet 650 mg  650 mg Oral Q6H PRN Harrie Foreman, MD   650 mg at 05/29/15 2703    Or  . acetaminophen (TYLENOL) suppository 650 mg  650 mg Rectal Q6H PRN Harrie Foreman, MD      . allopurinol (ZYLOPRIM) tablet 100 mg  100 mg Oral Daily Laverle Hobby, MD   100 mg at 06/04/15 1015  . amLODipine (NORVASC) tablet 5 mg  5 mg Oral Daily Theodoro Grist, MD   5 mg at 06/04/15 1034  . antiseptic oral rinse solution (CORINZ)  7 mL Mouth Rinse QID Vishal Mungal, MD   7 mL at 06/04/15 0400  . budesonide (PULMICORT) nebulizer solution 0.5 mg  0.5 mg Nebulization BID Flora Lipps, MD   0.5 mg at 06/04/15 0859  . chlorhexidine gluconate (PERIDEX) 0.12 % solution 15 mL  15 mL Mouth Rinse BID Vishal Mungal, MD   15 mL at 06/04/15 0736  . dexmedetomidine (PRECEDEX) 400 MCG/100ML (4 mcg/mL) infusion  0-1.2 mcg/kg/hr Intravenous Continuous Demetrios Loll, MD 15.8 mL/hr at 06/04/15 1001 0.6 mcg/kg/hr at 06/04/15 1001  . dextrose 5 % solution   Intravenous Continuous Vishal Mungal, MD 75 mL/hr at 06/03/15 1402    . famotidine (PEPCID) tablet 20 mg  20 mg Per Tube BID Theodoro Grist, MD   20 mg at 06/04/15 1015  . feeding supplement (VITAL HIGH PROTEIN) liquid 1,000 mL  1,000 mL Per Tube Continuous Vilinda Boehringer, MD 60 mL/hr at 06/04/15 1221  1,000 mL at 06/04/15 1221  . fentaNYL (SUBLIMAZE) injection 25-100 mcg  25-100 mcg Intravenous Q2H PRN Wilhelmina Mcardle, MD   50 mcg at 06/04/15 1007  . filgrastim (NEUPOGEN) injection 300 mcg  300 mcg Subcutaneous Daily Lloyd Huger, MD   300 mcg at 06/04/15 1034  . fluconazole (DIFLUCAN) tablet 200 mg  200 mg Oral Daily Vishal Mungal, MD   200 mg at 06/04/15 1009  . free water 200 mL  200 mL Per Tube 3 times per day Vishal Mungal, MD   200 mL at 06/04/15 0600  . insulin aspart (novoLOG) injection 0-15 Units  0-15 Units Subcutaneous 6 times per day Wilhelmina Mcardle, MD   3 Units at 06/04/15 1217  . insulin detemir (LEVEMIR) injection 15 Units  15 Units Subcutaneous Q24H Laverle Hobby, MD   15 Units at 06/04/15 1218  . ipratropium-albuterol (DUONEB)  0.5-2.5 (3) MG/3ML nebulizer solution 3 mL  3 mL Nebulization Q6H Vishal Mungal, MD   3 mL at 06/04/15 0859  . losartan (COZAAR) tablet 100 mg  100 mg Oral Daily Theodoro Grist, MD   100 mg at 06/04/15 1009  . metoprolol (LOPRESSOR) tablet 100 mg  100 mg Per Tube BID Wilhelmina Mcardle, MD   100 mg at 06/04/15 1009  . midazolam (VERSED) injection 1-2 mg  1-2 mg Intravenous Q2H PRN Wilhelmina Mcardle, MD   2 mg at 06/04/15 0002  . pregabalin (LYRICA) capsule 50 mg  50 mg Per Tube TID Wilhelmina Mcardle, MD   50 mg at 06/04/15 1009    OBJECTIVE: Filed Vitals:   06/04/15 1400  BP: 133/62  Pulse: 86  Temp:   Resp: 20     Body mass index is 38.18 kg/(m^2).    ECOG FS:4 - Bedbound  General:  intubated, but alert Eyes: Pink conjunctiva, anicteric sclera. HEENT:  ET tube in place. Lungs: Clear to auscultation bilaterally. Heart: Regular rate and rhythm. No rubs, murmurs, or gallops. Abdomen: Soft, nontender, nondistended. No organomegaly noted, normoactive bowel sounds. Musculoskeletal: No edema, cyanosis, or clubbing. Neuro: Alert, frustrated.  Skin: No rashes or petechiae noted.   LAB RESULTS:  Lab Results  Component Value Date   NA 150* 06/04/2015   K 3.5 06/04/2015   CL 115* 06/04/2015   CO2 31 06/04/2015   GLUCOSE 140* 06/04/2015   BUN 25* 06/04/2015   CREATININE 0.68 06/04/2015   CALCIUM 8.0* 06/04/2015   PROT 5.3* 05/27/2015   ALBUMIN 2.6* 05/27/2015   AST 35 05/27/2015   ALT 49 05/27/2015   ALKPHOS 43 05/27/2015   BILITOT 0.8 05/27/2015   GFRNONAA >60 06/04/2015   GFRAA >60 06/04/2015    Lab Results  Component Value Date   WBC 0.6* 06/04/2015   NEUTROABS 0.2* 06/03/2015   HGB 8.8* 06/04/2015   HCT 26.7* 06/04/2015   MCV 91.0 06/04/2015   PLT 25* 06/04/2015     STUDIES: Dg Chest 1 View  05/31/2015  CLINICAL DATA:  Dyspnea. EXAM: CHEST 1 VIEW COMPARISON:  05/30/2015 FINDINGS: ET tube tip is above the carina. There is a nasogastric tube in place. Right arm PICC line  tip is in the SVC. Loculated right pleural effusion is unchanged from previous exam. Cardiac enlargement and aortic atherosclerosis again noted. IMPRESSION: 1. No change and large loculated right pleural effusion. 2. Cardiac enlargement and aortic atherosclerosis. Electronically Signed   By: Kerby Moors M.D.   On: 05/31/2015 09:31   Dg Chest 1 View  05/30/2015  CLINICAL DATA:  Shortness of breath. EXAM: CHEST 1 VIEW COMPARISON:  05/29/2015.  CT 05/20/2015 . FINDINGS: Endotracheal tube, NG tube, right PICC line in stable position. Persistent consolidation of the right lung and right pleural effusion again noted . Heart size stable. No pneumothorax. No acute osseous abnormality. IMPRESSION: 1. Lines and tubes in stable position. 2. Persistent consolidation of the right lung with prominent right pleural effusion. No interim change from prior exam. Electronically Signed   By: Marcello Moores  Register   On: 05/30/2015 07:15   Dg Chest 1 View  05/29/2015  CLINICAL DATA:  Dyspnea, respiratory failure, COPD, lung mass EXAM: CHEST 1 VIEW COMPARISON:  Portable chest x-ray of May 28, 2015 FINDINGS: There is persistent near total atelectasis of the right lung with some aeration noted in the lower hemi thorax. The left lung is clear. There is no significant mediastinal shift. The cardiac silhouette is normal in size. The endotracheal tube tip lies 4.6 cm above the carina. The esophagogastric tube tip projects below the inferior margin of the image. The right-sided PICC line tip projects over the midportion of the SVC. IMPRESSION: Persistent consolidation of the mid and upper right lung with small amount of residual aerated right lower lung. The left lung is clear. The support tubes are in reasonable position. Electronically Signed   By: David  Martinique M.D.   On: 05/29/2015 07:20   Dg Chest 1 View  05/28/2015  CLINICAL DATA:  Hypoxia EXAM: CHEST 1 VIEW COMPARISON:  May 26, 2015 FINDINGS: Endotracheal tube tip is 4.6 cm  above the carina. Nasogastric tube tip and side port below the diaphragm. Central catheter tip is in the superior cava. No pneumothorax. There is complete opacification of the right hemithorax with volume loss and probable effusion. There is elevation of the right hemidiaphragm. The left lung is clear. The heart size is within normal limits. Pulmonary vascularity on the left is within normal limits. Pulmonary vascularity on the right is obscured. There is atherosclerotic change throughout the aorta. IMPRESSION: Tube and catheter positions as described without pneumothorax. Complete opacification of the right hemithorax remains with evidence of volume loss and probable pleural effusion. Left lung clear. No change in cardiac silhouette. Electronically Signed   By: Lowella Grip III M.D.   On: 05/28/2015 07:18   Dg Chest 1 View  05/26/2015  CLINICAL DATA:  Wheezing and shortness of breath EXAM: CHEST 1 VIEW COMPARISON:  Portable chest x-ray of today's date FINDINGS: There is persistent 0 opacification of the mid and upper thirds of the right hemi thorax. Only a small amount of aerated lung is visible in the lower hemi thorax. Pleural fluid on the right is suspected. The cardiac silhouette is mildly enlarged. The left lung is well-expanded. There is no focal infiltrate or pleural effusion on the left. The endotracheal tube tip projects 4.2 cm above the carina. The PICC line tip on the left overlies the junction of the proximal and midportions of the SVC. IMPRESSION: Fairly stable appearance of the chest since the previous study with opacification of the upper 2/3 of the right hemithorax consistent with atelectasis-pneumonia and left pleural effusion. The left lung remains clear. Electronically Signed   By: David  Martinique M.D.   On: 05/26/2015 13:51   Dg Abd 1 View  06/02/2015  CLINICAL DATA:  Status post OG tube placement. EXAM: ABDOMEN - 1 VIEW COMPARISON:  None. FINDINGS: OG tube is in place with the tip in  the stomach. The tube could be advanced  3-4 cm for better positioning. IMPRESSION: As above. Electronically Signed   By: Inge Rise M.D.   On: 06/02/2015 15:21   Dg Abd 1 View  06/02/2015  CLINICAL DATA:  Orogastric tube placement. EXAM: ABDOMEN - 1 VIEW COMPARISON:  Earlier today at 0800 hours. FINDINGS: Motion and patient body habitus degradation. Nasogastric tube terminates at the body of the stomach. The side-port may be above the gastroesophageal junction. No gross free intraperitoneal air. Partial right hemi thorax opacification, incompletely imaged. IMPRESSION: Degraded exam, as detailed above. Nasogastric terminating at the body of the stomach. Electronically Signed   By: Abigail Miyamoto M.D.   On: 06/02/2015 09:28   Dg Abd 1 View  06/02/2015  CLINICAL DATA:  NG tube placement . EXAM: ABDOMEN - 1 VIEW COMPARISON:  05/30/2015 . FINDINGS: NG tube tip noted at the level of the gastroesophageal junction. More distal placement should be considered. Persistent bowel distention. No free air . IMPRESSION: NG tube tip noted at the level of the gastroesophageal junction. More distal placement should be considered.These results will be called to the ordering clinician or representative by the Radiologist Assistant, and communication documented in the PACS or zVision Dashboard. Electronically Signed   By: Marcello Moores  Register   On: 06/02/2015 08:19   Dg Abd 1 View  05/30/2015  CLINICAL DATA:  Lung cancer.  Distended abdomen. EXAM: ABDOMEN - 1 VIEW COMPARISON:  05/20/2015 FINDINGS: Mild gaseous distention of the stomach and transverse colon. No small bowel dilatation to suggest obstruction. No organomegaly. No free air. IMPRESSION: Mild gaseous distention of the stomach and transverse colon. This may reflect mild ileus. Electronically Signed   By: Rolm Baptise M.D.   On: 05/30/2015 16:42   Ct Chest Wo Contrast  05/20/2015  CLINICAL DATA:  69 year old who presented with acute hypercapnic respiratory failure,  current history of COPD and CHF, stage 3 chronic kidney disease, with an enlarging right pleural effusion and right paratracheal soft tissue on chest x-ray yesterday. EXAM: CT CHEST WITHOUT CONTRAST TECHNIQUE: Multidetector CT imaging of the chest was performed following the standard protocol without IV contrast. Intravenous contrast was not administered due to the patient's chronic kidney disease. COMPARISON:  No prior CT.  Chest x-rays 04/30/2015 and earlier. FINDINGS: Best seen on coronal reformatted images is abrupt occlusion of the right upper lobe bronchus and the right bronchus intermedius centrally. As a result, there is dense consolidation in the entire right lung with a areas of hypoattenuation giving a "drowned lung" appearance. Without intravenous contrast, it is difficult to visualize the large obstructing mass as it has similar attenuation to the consolidated lung. There is an associated small to moderate sized right pleural effusion. No pulmonary parenchymal nodules or masses involving the left lung. Linear scarring involving the medial left lower lobe. No confluent airspace consolidation in the left lung. No left pleural effusion. Apparent pleural thickening throughout the left hemithorax is related to abundant subpleural fat. Bulky right paratracheal conglomerate lymphadenopathy in station 2R and station 4R measuring approximately 5.6 x 5.0 x 7.4 cm. Enlarged lymph nodes elsewhere in the right side of the mediastinum. Enlarged right retroclavicular lymph nodes, the largest measuring approximately 2.8 x 3.8 x 3.1 cm. The bulky mediastinal lymphadenopathy compresses the superior vena cava, and there is a prominent right internal mammary vein collateral. Heart size upper normal. No pericardial effusion. Mild LAD and right coronary artery atherosclerosis. Moderate atherosclerosis involving the thoracic and upper abdominal aorta and their visualized branches. Approximate 3 cm cyst involving the  posterior  segment right lobe of liver. Within the limits of the unenhanced technique, no solid mass involving visualized liver. Approximate 1.6 x 2.6 x 2.2 cm low-attenuation mass involving the left adrenal gland. Solitary punctate calcification involving the proximal body of the pancreas. Visualized upper abdomen otherwise unremarkable for the unenhanced technique. IMPRESSION: 1. Obstructing central mass involving the right lung with occlusion of the right upper lobe bronchus and the bronchus intermedius with complete atelectasis of the right lung. The mass is difficult to visualize as it is of similar attenuation to the atelectatic lung. Bronchoscopy is recommended in further evaluation and for diagnosis. 2. Small to moderate-sized right pleural effusion. 3. Metastatic mediastinal lymphadenopathy as detailed above, the largest conglomerate nodal mass in the right paratracheal region. 4. Mild compression of the superior vena cava with a prominent right internal mammary vein collateral. 5. Approximate 3 cm cyst involving the posterior segment right lobe of the visualized liver. 6. Approximate 2.6 cm low-attenuation mass involving the left adrenal gland, likely adenoma. Electronically Signed   By: Evangeline Dakin M.D.   On: 05/20/2015 12:41   [place] Chest  06/02/2015  CLINICAL DATA:  Pleural effusion.  Lung cancer. EXAM: CHEST ULTRASOUND COMPARISON:  Chest x-ray 06/02/2015. FINDINGS: Moderate right-sided pleural effusion noted. IMPRESSION: Moderate right pleural effusion . Electronically Signed   By: Marcello Moores  Register   On: 06/02/2015 10:05   US Renal  05/21/2015  CLINICAL DATA:  Acute renal failure EXAM: RENAL / URINARY TRACT ULTRASOUND COMPLETE COMPARISON:  None. FINDINGS: Right Kidney: Length: 11 cm. Echogenicity within normal limits. No mass or hydronephrosis visualized. There is cortical thinning probable due to atrophy Left Kidney: Length: 12.2 cm. Cortical thinning is noted probable due to atrophy. Echogenicity  within normal limits. No mass or hydronephrosis visualized. Bladder: The urinary bladder is decompressed with Foley catheter. IMPRESSION: 1. No hydronephrosis. No renal calculi. Bilateral cortical thinning probable due to atrophy. Decompressed urinary bladder with Foley catheter. Electronically Signed   By: Lahoma Crocker M.D.   On: 05/21/2015 09:25   Dg Chest Port 1 View  06/02/2015  CLINICAL DATA:  69 year old female with a history of small cell lung cancer EXAM: PORTABLE CHEST 1 VIEW COMPARISON:  Multiple prior plain film, including 05/31/2015, limb 10/2014, 05/29/2015, CT chest 05/20/2015 FINDINGS: Cardiomediastinal silhouette obscured by overlying lung/ pleural disease. Unchanged position of endotracheal tube, terminating suitably above the carina, 4.5 cm. Unchanged gastric tube, terminating out of the field of view. Unchanged right upper extremity PICC, which terminates at the level the right mainstem bronchus. Overlying EKG leads and ventilator tubing. Atherosclerosis of the aortic arch. Dense opacity of the right lung persists, with obscuration the right hemidiaphragm, right heart borders, and the majority of the right lung. Minimal maintained aeration at the right apex. Airspace opacity at the left base persists, with similar aeration to the prior. No pneumothorax. IMPRESSION: Similar appearance of dense right-sided airspace opacity, likely a combination of lung consolidation, atelectasis, tumor tissue, and pleural effusion. Unchanged position of support apparatus, as above. Airspace disease at the left base, similar to the comparison, likely combination of atelectasis, edema, consolidation. Small left pleural effusion not excluded. Atherosclerosis. Signed, Dulcy Fanny. Earleen Newport, DO [facility] Electronically Signed   By: Corrie Mckusick D.O.   On: 06/02/2015 08:23   Dg Chest Port 1 View  05/26/2015  CLINICAL DATA:  Respiratory failure. EXAM: PORTABLE  CHEST 1 VIEW COMPARISON:  05/25/2015 .  CT 05/20/2015. FINDINGS: Endotracheal tube and NG tube  noted in stable position. Interim increase consolidation in the right upper lobe. Persistent right pleural effusion. Left lung is clear. Stable cardiomegaly. No pneumothorax. No acute osseus abnormality P IMPRESSION: Interim increase in consolidation of the right upper lobe with persistent right pleural effusion. Persistent occlusion of the right mainstem bronchus appears to be present. Electronically Signed   By: [place]   On: 05/26/2015 07:13   Dg Chest Port 1 View  05/25/2015  CLINICAL DATA:  Respiratory failure. EXAM: PORTABLE CHEST 1 VIEW COMPARISON:  05/21/2015 FINDINGS: The ET tube tip is above the carina. The nasogastric tube tip is below the GE junction. Normal heart size. Aortic atherosclerosis. Partially loculated right pleural effusion is again identified and appears unchanged from the previous exam. Left lung is clear. IMPRESSION: 1. Loculated right pleural effusion is stable. 2. Left lung is clear. Electronically Signed   By: Kerby Moors M.D.   On: 05/25/2015 09:03   Dg Chest Port 1 View  05/21/2015  CLINICAL DATA:  Respiratory failure, intubated EXAM: PORTABLE CHEST 1 VIEW COMPARISON:  05/20/2015 FINDINGS: Cardiomegaly again noted. Stable endotracheal and NG tube position. Again noted almost complete opacification of the right hemi thorax. Chronic elevation of the right hemidiaphragm. Mild left basilar atelectasis. No convincing pulmonary edema. No pneumothorax. Atherosclerotic calcifications of thoracic aorta again noted. IMPRESSION: Stable endotracheal and NG tube position. Again noted almost complete opacification of the right hemi thorax. Chronic elevation of the right hemidiaphragm. Mild left basilar atelectasis. No convincing pulmonary edema. No pneumothorax. Atherosclerotic calcifications of thoracic aorta again noted. Electronically Signed   By: Lahoma Crocker M.D.   On: 05/21/2015  08:22   Dg Chest Port 1 View  05/20/2015  CLINICAL DATA:  Acute respiratory failure with hypoxia. Status post bronchoscopy and line placement. EXAM: PORTABLE CHEST 1 VIEW COMPARISON:  05/20/2015 at 1302 hours FINDINGS: Following bronchoscopy, there is now partial aeration of the right lung noted in the right upper to mid lung. There is also some aeration now noted in the right lower lung with a band of opacity noted across the mid lung bordering the minor fissure. Left lung is hyperexpanded but essentially clear. No pneumothorax. Endotracheal tube is stable with its tip 4.7 cm above the carina. Orogastric tube passes below the diaphragm. IMPRESSION: 1. Improved right lung aeration following bronchoscopy. No pneumothorax. 2. No other change. Electronically Signed   By: Lajean Manes M.D.   On: 05/20/2015 17:42   Dg Chest Port 1 View  05/20/2015  CLINICAL DATA:  Hypoxia EXAM: PORTABLE CHEST 1 VIEW COMPARISON:  Chest CT obtained earlier in the day; chest radiograph May 19, 2015 FINDINGS: Endotracheal tube tip is 4.6 cm above the carina. Nasogastric tube tip and side port are below the diaphragm. No pneumothorax. There is now complete opacification of the right hemi thorax, felt to be due to a combination of effusion and consolidation. Left lung is clear. Heart size is within normal limits. There is atherosclerotic change in the aorta. Mass lesions seen on CT are obscured on the right by the diffuse consolidation and effusion. IMPRESSION: Tube positions as described without pneumothorax. Left lung clear. Complete opacification on the right. Cardiac silhouette within normal limits. Atherosclerotic calcification noted. Electronically Signed   By: Lowella Grip III M.D.   On: 05/20/2015 13:18   Dg Chest Portable 1 View  04/29/2015  CLINICAL DATA:  69 year old female with increasing shortness of breath Coll the today. EXAM: PORTABLE CHEST 1 VIEW COMPARISON:  Chest x-ray 04/24/2015. FINDINGS: Enlarging  right pleural effusion. Extensive opacities throughout the right mid to lower lung, which may simply reflect passive atelectasis, however, underlying airspace consolidation or underlying mass is not excluded. Persistent prominence of right paratracheal soft tissue highly concerning for lymphadenopathy. Left lung appears relatively clear, although the left base is poorly visualized medially (likely technique related). No evidence of pulmonary edema. Heart size is normal. Atherosclerotic calcifications in the thoracic aorta. IMPRESSION: 1. Overall, there has been significant progression compared to the prior chest x-ray from 04/24/2015, with enlarging now a large right pleural effusion and worsening aeration throughout the right lung. This is unusual in the setting of a treated pneumonia, and given the presence of paratracheal soft tissue thickening on the right which is suspicious for lymphadenopathy, underlying neoplasm is strongly suspected. Further evaluation with contrast enhanced chest CT in is strongly recommended in the near future to evaluate for underlying neoplasm. 2. Atherosclerosis. Electronically Signed   By: Vinnie Langton M.D.   On: 05/18/2015 01:27   Dg Abd Portable 1v  05/20/2015  CLINICAL DATA:  OG an indentation. EXAM: PORTABLE ABDOMEN - 1 VIEW COMPARISON:  None. FINDINGS: Enteric tube tip is adequately positioned in the body of the stomach. Mildly prominent gas-filled loops of small bowel are noted within the right lower quadrant. Overall bowel gas pattern is indeterminate. No evidence of free intraperitoneal air seen. Large dense opacity noted at the right lung base. IMPRESSION: 1. OG tube appears adequately positioned with tip in the region of the stomach body. 2. Large dense opacity at the right lung base, incompletely imaged, which could be consolidation or effusion. Electronically Signed   By: Franki Cabot M.D.   On: 05/20/2015 13:19    ASSESSMENT:  Small cell lung cancer.   PLAN:     1. Small cell lung cancer: Patient is likely stage IV given her suspicious adrenal lesion, but full staging workup cannot be completed given her acute illness. Patient has had multiple failed attempts at extubation likely secondary to external compression of her mass. Patient has completed cycle 1 of treatment with Cisplatin and Etoposide. Cycle 2, day 1 of chemotherapy will be scheduled on June 16, 2015. She will ultimately require port placement once she is extubated. XRT would be helpful, but there are patient safety concerns transporting to radiation oncology.  Will consider CT scan of chest later this week to assess for any interval change of her mass. 2. Pancytopenia: Secondary to chemotherapy. Continue Neupogen as prescribed. Monitor daily CBC. 3. Tracheostomy: Agree that it is unsafe for surgery at this time given her thrombocytopenia. Case was previously discussed with ENT and appreciate their input.  4. Mechanical ventilation: Repeat extubation per pulmonology.   Will follow.   Lloyd Huger, MD   06/04/2015 2:16 PM

## 2015-06-04 NOTE — Progress Notes (Signed)
Nutrition Follow-up   INTERVENTION:   EN: recommend continuing current TF regimen, continue to assess  NUTRITION DIAGNOSIS:   Inadequate oral intake related to acute illness as evidenced by NPO status.   GOAL:   Provide needs based on ASPEN/SCCM guidelines  MONITOR:    (Energy Intake, Anthropometrics, Electrolyte/Renal Profile, Digestive System, Electrolyte/Renal Profile)  REASON FOR ASSESSMENT:   Consult Enteral/tube feeding initiation and management  ASSESSMENT:     Pt remains alert on vent, neutropenic, started on neupogen today  EN: tolerating Vital High Protein at rate of 60 ml/hr  Digestive System:  No signs of TF intolerance  Electrolyte and Renal Profile:  Recent Labs Lab 05/31/15 0445  06/02/15 0524 06/02/15 1616 06/03/15 0539 06/04/15 0514  BUN 72*  < > 40* 36* 29* 25*  CREATININE 0.90  < > 0.79 0.75 0.70 0.68  NA 153*  < > 153* 154* 149* 150*  K 4.6  < > 4.0 4.0 3.6 3.5  MG 1.7  --  1.5*  --  1.6* 1.8  PHOS 4.0  --  3.1  --   --  2.8  < > = values in this interval not displayed. Glucose Profile:  Recent Labs  06/03/15 2343 06/04/15 0736 06/04/15 1110  GLUCAP 130* 142* 157*   Nutritional Anemia Profile:  CBC Latest Ref Rng 06/04/2015 06/03/2015 05/31/2015  WBC 3.6 - 11.0 K/uL 0.6(LL) 0.8(LL) 3.7  Hemoglobin 12.0 - 16.0 g/dL 8.8(L) 9.1(L) 10.4(L)  Hematocrit 35.0 - 47.0 % 26.7(L) 27.8(L) 32.1(L)  Platelets 150 - 440 K/uL 25(LL) 33(L) 49(L)    Meds: ss novolog, levemir, D5 at 75 ml/hr  Height:   Ht Readings from Last 1 Encounters:  05/16/2015 '5\' 7"'$  (1.702 m)    Weight:   Wt Readings from Last 1 Encounters:  06/04/15 243 lb 13.3 oz (110.6 kg)    Filed Weights   06/02/15 0527 06/03/15 0500 06/04/15 0800  Weight: 252 lb 10.4 oz (114.6 kg) 246 lb 4.1 oz (111.7 kg) 243 lb 13.3 oz (110.6 kg)    BMI:  Body mass index is 38.18 kg/(m^2).  Estimated Nutritional Needs:   Kcal:  3491-7915 kcals (11-14 kcals/kg) using current wt of 105.6  kg  Protein:  92-122 g (1.5-2.0 g/kg)   Fluid:  1525-1830 mL (25-30 ml/kg)   EDUCATION NEEDS:   No education needs identified at this time  [place], [place], LDN [phone] Pager

## 2015-06-04 NOTE — Progress Notes (Signed)
Pharmacy Consult for Fluconazole  Indication: UTI  Allergies  Allergen Reactions  . Atorvastatin Other (See Comments)    Does not remember why she had Intolerance to Lipitor.  . Fentanyl Itching  . Rosuvastatin Anxiety    Chest tigtness and feeling as if she had heartburn.    Patient Measurements: Height: '5\' 7"'$  (170.2 cm) Weight: 243 lb 13.3 oz (110.6 kg) IBW/kg (Calculated) : 61.6  Vital Signs: Temp: 98.4 F (36.9 C) (11/09 1200) Temp Source: Oral (11/09 1200) BP: 142/85 mmHg (11/09 1500) Pulse Rate: 86 (11/09 1400) Intake/Output from previous day: 11/08 0701 - 11/09 0700 In: 3149.6 [I.V.:2064.6; NG/GT:985; IV Piggyback:100] Out: 2100 [Urine:2100] Intake/Output from this shift: Total I/O In: 1366.4 [I.V.:726.4; NG/GT:640] Out: 350 [Urine:350]  Labs:  Recent Labs  06/02/15 1616 06/03/15 0539 06/04/15 0514  WBC  --  0.8* 0.6*  HGB  --  9.1* 8.8*  PLT  --  33* 25*  CREATININE 0.75 0.70 0.68   Estimated Creatinine Clearance: 87.5 mL/min (by C-G formula based on Cr of 0.68). No results for input(s): VANCOTROUGH, VANCOPEAK, VANCORANDOM, GENTTROUGH, GENTPEAK, GENTRANDOM, TOBRATROUGH, TOBRAPEAK, TOBRARND, AMIKACINPEAK, AMIKACINTROU, AMIKACIN in the last 72 hours.   Microbiology: Recent Results (from the past 720 hour(s))  MRSA PCR Screening     Status: None   Collection Time: 05/02/2015 12:28 AM  Result Value Ref Range Status   MRSA by PCR NEGATIVE NEGATIVE Final    Comment:        The GeneXpert MRSA Assay (FDA approved for NASAL specimens only), is one component of a comprehensive MRSA colonization surveillance program. It is not intended to diagnose MRSA infection nor to guide or monitor treatment for MRSA infections.   Culture, blood (routine x 2)     Status: None   Collection Time: 04/28/2015  1:13 AM  Result Value Ref Range Status   Specimen Description BLOOD RIGHT HAND  Final   Special Requests BOTTLES DRAWN AEROBIC AND ANAEROBIC 3CC  Final   Culture NO  GROWTH 5 DAYS  Final   Report Status 05/24/2015 FINAL  Final  Culture, blood (routine x 2)     Status: None   Collection Time: 05/22/2015  1:13 AM  Result Value Ref Range Status   Specimen Description BLOOD RIGHT ASSIST CONTROL  Final   Special Requests BOTTLES DRAWN AEROBIC AND ANAEROBIC 4CC  Final   Culture NO GROWTH 5 DAYS  Final   Report Status 05/24/2015 FINAL  Final  Culture, expectorated sputum-assessment     Status: None   Collection Time: 05/20/15  2:00 PM  Result Value Ref Range Status   Specimen Description EXPECTORATED SPUTUM  Final   Special Requests NONE  Final   Sputum evaluation THIS SPECIMEN IS ACCEPTABLE FOR SPUTUM CULTURE  Final   Report Status 05/20/2015 FINAL  Final  Culture, respiratory (NON-Expectorated)     Status: None   Collection Time: 05/20/15  2:00 PM  Result Value Ref Range Status   Specimen Description EXPECTORATED SPUTUM  Final   Special Requests NONE Reflexed from [id number]  Final   Gram Stain   Final    FEW WBC SEEN FEW GRAM POSITIVE RODS FAIR SPECIMEN - 70-80% WBCS    Culture APPEARS TO BE NORMAL FLORA  Final   Report Status 05/22/2015 FINAL  Final  Culture, bal-quantitative     Status: None   Collection Time: 05/20/15  5:05 PM  Result Value Ref Range Status   Specimen Description BRONCHIAL ALVEOLAR LAVAGE  Final   Special Requests  Normal  Final   Gram Stain   Final    MODERATE WBC SEEN RARE GRAM POSITIVE RODS RARE GRAM POSITIVE COCCI IN CHAINS GOOD SPECIMEN - 80-90% WBCS    Culture APPEARS TO BE NORMAL FLORA  Final   Report Status 05/22/2015 FINAL  Final  Fungus Culture with Smear     Status: None (Preliminary result)   Collection Time: 05/20/15  5:05 PM  Result Value Ref Range Status   Specimen Description SPUTUM  Final   Special Requests Normal  Final   Culture CANDIDA DUBLINIENSIS  Final   Report Status PENDING  Incomplete  C difficile quick scan w PCR reflex     Status: None   Collection Time: 05/29/15  4:50 PM  Result Value Ref  Range Status   C Diff antigen NEGATIVE NEGATIVE Final   C Diff toxin NEGATIVE NEGATIVE Final   C Diff interpretation Negative for C. difficile  Final  Urine culture     Status: None   Collection Time: 05/29/15  6:19 PM  Result Value Ref Range Status   Specimen Description URINE, RANDOM  Final   Special Requests NONE  Final   Culture >=100,000 COLONIES/mL CANDIDA ALBICANS  Final   Report Status 06/04/2015 FINAL  Final    Medical History: Past Medical History  Diagnosis Date  . Anxiety   . Arthritis   . COPD (chronic obstructive pulmonary disease) ([place])   . CHF (congestive heart failure) ([place])   . Hyperlipidemia   . Hypertension   . Diabetes mellitus without complication ([place])   . Chronic kidney disease   . Neuromuscular disorder ([facility])     Medications:  Scheduled:  . allopurinol  100 mg Oral Daily  . amLODipine  5 mg Oral Daily  . antiseptic oral rinse  7 mL Mouth Rinse QID  . budesonide (PULMICORT) nebulizer solution  0.5 mg Nebulization BID  . chlorhexidine gluconate  15 mL Mouth Rinse BID  . famotidine  20 mg Per Tube BID  . filgrastim  300 mcg Subcutaneous Daily  . fluconazole  200 mg Oral Daily  . free water  200 mL Per Tube 6 times per day  . insulin aspart  0-15 Units Subcutaneous 6 times per day  . insulin detemir  15 Units Subcutaneous Q24H  . ipratropium-albuterol  3 mL Nebulization Q6H  . losartan  100 mg Oral Daily  . metoprolol  100 mg Per Tube BID  . pregabalin  50 mg Per Tube TID   Infusions:  . dexmedetomidine 0.6 mcg/kg/hr (06/04/15 1001)  . dextrose 75 mL/hr at 06/03/15 1402  . feeding supplement (VITAL HIGH PROTEIN) 1,000 mL (06/04/15 1507)   Assessment: 69 y/o Female, day 15 of hospital stay. Patient  with SCLC and currently intubated. Urine cultures from 11/3 growing yeast. Pharmacy consulted for dosing of fluconazole. Patient received '400mg'$  IV x 2 doses.     Plan:  Will continue patients dose to fluconazole '200mg'$  po daily. . Duration of  treatment is 2 weeks.  Pharmacy will continue to monitor patients labs and renal function and make adjustments as needed.   Nancy Fetter, PharmD Pharmacy Resident

## 2015-06-04 NOTE — Progress Notes (Signed)
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 The patient is vent dependent. Due to the right lung atelectasis. She has completed course of chemotherapy, trach is being considered, if the patient cannot be weaned off the ventilator. Currently any surgical procedures being complicated by her thrombocytopenia, secondary to a nadir from chemotherapy   Interim history: No acute events overnight.  She continues to be on the vent, her breathing is comfortable this morning. Slow vent wean at this time  PULMONARY -Acute respiratory failure due to complete right lung atelectasis from newly diagnosed SCLC. The patient has completed her first course of chemotherapy with 3 days of cisplatin and etoposide. Her respiratory function appears to be improved and the right lung atelectasis appears to be improving. - Pleural effusion - possible loculated in the right, may consider U/S thoracentesis once Plts are more stable.  - Vent dependent - f/u CXR in the AM -Pt has completed first course of chemo.  -ENT consulted for tracheostomy. Recommended holding off for now, Given elevated risk due to thrombocytopenia. -Patient currently in her nadir from chemotherapy, started on Neupogen today. -We will continue weaning trials, and see if the patient can be extubated over the next week before any potential tracheostomy is considered again.  CARDIOVASCULAR -A. fib with RVR. Patient was previously on a Cardizem drip, this was changed to amiodarone, the rate is now well controlled. -We'll continue amiodarone drip and changed to oral per protocol.  RENAL CKD, stable.  -Hypernatremia,slightly increasing, will inc D5W to 75cc/hr.  Currently, with NGT issues, once NGT is placed correctly will resume TF and free H2O flushes.     GASTROINTESTINAL ?Ileus >> resolved on today's KUB NGT replacement -Restart tube feeds -Continue pepcid for GI prophylaxis.   HEMATOLOGIC R Lung Mass - SCLC Thrombocytopenia, Plt = 25 Neutropenia -s/p cisplatin/etopiside (1st round, 3 days).  - low HIT panel, if plts start to rise may consider a direct thrombin inhibitor for DVT prophylaxis -Started on Neupogen  BCx2 10/24; negative.  Bronch Cx. 05/20/15>> normal flora. AFB and fungi pending.    ENDOCRINE A:  Diabetes, hyperglycemia.  P:   continued every 4 hours sliding scale insulin. Continue Levemir.  NEUROLOGIC A: Anxiety.   MAJOR EVENTS/TEST RESULTS: Bronch 10/25 with forceps biopsy of RUL>> SCLC.   INDWELLING DEVICES:: Endotracheal tube: 10/25. CVC triple lumen: Right femoral: 10/25>>10/31 Double-lumen PICC line: 10/31.  ---------------------------------------  ----------------------------------------   Name: Emily Pratt MRN: [id number] DOB: 16-Mar-1946    ADMISSION DATE:  05/20/2015    SUBJECTIVE:  Pt currently on the ventilator,no acute complaints, no pain, able to communicate via writing pad.  Intubated and interactive. Tried PSV this morning, tolerated about 30 mins  Review of Systems:  GEN - no fever, no discomfort HEENT - no headache, no eye discomfort, no vision impairment CVS - no palpitations, no murmurs RESP - chronic sob,  ABD - no abd pain,  EXT - no jt pain  VITAL SIGNS: Temp:  [97.8 F (36.6 C)-98.4 F (36.9 C)] 98.4 F (36.9 C) (11/09 1200) Pulse Rate:  [68-147] 86 (11/09 1400) Resp:  [7-40] 20 (11/09 1400) BP: (104-164)/(55-138) 133/62 mmHg (11/09 1400) SpO2:  [91 %-100 %] 94 % (11/09 1400) FiO2 (%):  [35 %] 35 % (11/09 1200) Weight:  [243 lb 13.3 oz (110.6 kg)] 243 lb 13.3 oz (  110.6 kg) (11/09 0800) HEMODYNAMICS:   VENTILATOR SETTINGS: Vent Mode:  [-] PRVC FiO2 (%):  [35 %] 35 % Set Rate:  [14 bmp] 14 bmp Vt Set:  [500 mL] 500 mL PEEP:  [5 cmH20] 5 cmH20 Pressure  Support:  [5 cmH20-15 cmH20] 15 cmH20 Plateau Pressure:  [19 cmH20-22 cmH20] 22 cmH20 INTAKE / OUTPUT:  Intake/Output Summary (Last 24 hours) at 06/04/15 1449 Last data filed at 06/04/15 1400  Gross per 24 hour  Intake 3309.6 ml  Output   1950 ml  Net 1359.6 ml    PHYSICAL EXAMINATION: Physical Examination:   VS: BP 133/62 mmHg  Pulse 86  Temp(Src) 98.4 F (36.9 C) (Oral)  Resp 20  Ht '5\' 7"'$  (1.702 m)  Wt 243 lb 13.3 oz (110.6 kg)  BMI 38.18 kg/m2  SpO2 94%  General Appearance: No distress  Neuro:without focal findings, mental status normal. HEENT: PERRLA, EOM intact. Pulmonary: normal breath sounds, decreased on right side.  CardiovascularNormal S1,S2.  No m/r/g.   Abdomen: Benign, Soft, non-tender. Renal:  No costovertebral tenderness  GU:  Not performed at this time. Endocrine: No evident thyromegaly. Skin:   warm, no rashes, no ecchymosis  Extremities: normal, no cyanosis, clubbing.   LABS:   LABORATORY PANEL:   CBC  Recent Labs Lab 06/04/15 0514  WBC 0.6*  HGB 8.8*  HCT 26.7*  PLT 25*    Chemistries   Recent Labs Lab 06/04/15 0514  NA 150*  K 3.5  CL 115*  CO2 31  GLUCOSE 140*  BUN 25*  CREATININE 0.68  CALCIUM 8.0*  MG 1.8  PHOS 2.8     Recent Labs Lab 06/03/15 1200 06/03/15 1615 06/03/15 2004 06/03/15 2343 06/04/15 0736 06/04/15 1110  GLUCAP 114* 109* 130* 130* 142* 157*   No results for input(s): PHART, PCO2ART, PO2ART in the last 168 hours. No results for input(s): AST, ALT, ALKPHOS, BILITOT, ALBUMIN in the last 168 hours.  Cardiac Enzymes No results for input(s): TROPONINI in the last 168 hours.  RADIOLOGY:  Dg Abd 1 View  06/02/2015  CLINICAL DATA:  Status post OG tube placement. EXAM: ABDOMEN - 1 VIEW COMPARISON:  None. FINDINGS: OG tube is in place with the tip in the stomach. The tube could be advanced 3-4 cm for better positioning. IMPRESSION: As above. Electronically Signed   By: Inge Rise M.D.   On:  06/02/2015 15:21     I have personally obtained a history, examined the patient, evaluated Pertinent laboratory and RadioGraphic/imaging results, and  formulated the assessment and plan   The Patient requires high complexity decision making for assessment and support, frequent evaluation and titration of therapies, application of advanced monitoring technologies and extensive interpretation of multiple databases. Critical Care Time devoted to patient care services described in this Pratt is 35 minutes.   Overall, patient is critically ill, prognosis is guarded.    Vilinda Boehringer, MD [facility] Pager [phone] (please enter 7-digits) On Call Pager - [phone] (please enter 7-digits)

## 2015-06-05 LAB — URINALYSIS COMPLETE WITH MICROSCOPIC (ARMC ONLY)
Bilirubin Urine: NEGATIVE
GLUCOSE, UA: NEGATIVE mg/dL
Ketones, ur: NEGATIVE mg/dL
Leukocytes, UA: NEGATIVE
Nitrite: NEGATIVE
PH: 5 (ref 5.0–8.0)
PROTEIN: 30 mg/dL — AB
Specific Gravity, Urine: 1.011 (ref 1.005–1.030)

## 2015-06-05 LAB — BASIC METABOLIC PANEL
ANION GAP: 4 — AB (ref 5–15)
BUN: 24 mg/dL — AB (ref 6–20)
CO2: 31 mmol/L (ref 22–32)
Calcium: 7.8 mg/dL — ABNORMAL LOW (ref 8.9–10.3)
Chloride: 111 mmol/L (ref 101–111)
Creatinine, Ser: 0.69 mg/dL (ref 0.44–1.00)
Glucose, Bld: 171 mg/dL — ABNORMAL HIGH (ref 65–99)
POTASSIUM: 3.6 mmol/L (ref 3.5–5.1)
SODIUM: 146 mmol/L — AB (ref 135–145)

## 2015-06-05 LAB — CBC
HEMATOCRIT: 25.2 % — AB (ref 35.0–47.0)
Hemoglobin: 8.1 g/dL — ABNORMAL LOW (ref 12.0–16.0)
MCH: 29 pg (ref 26.0–34.0)
MCHC: 32.3 g/dL (ref 32.0–36.0)
MCV: 89.9 fL (ref 80.0–100.0)
Platelets: 18 10*3/uL — CL (ref 150–440)
RBC: 2.8 MIL/uL — ABNORMAL LOW (ref 3.80–5.20)
RDW: 17.6 % — AB (ref 11.5–14.5)
WBC: 0.5 10*3/uL — CL (ref 3.6–11.0)

## 2015-06-05 LAB — GLUCOSE, CAPILLARY
Glucose-Capillary: 145 mg/dL — ABNORMAL HIGH (ref 65–99)
Glucose-Capillary: 147 mg/dL — ABNORMAL HIGH (ref 65–99)
Glucose-Capillary: 166 mg/dL — ABNORMAL HIGH (ref 65–99)
Glucose-Capillary: 189 mg/dL — ABNORMAL HIGH (ref 65–99)
Glucose-Capillary: 192 mg/dL — ABNORMAL HIGH (ref 65–99)
Glucose-Capillary: 156 mg/dL — ABNORMAL HIGH (ref 65–99)
Glucose-Capillary: 142 mg/dL — ABNORMAL HIGH (ref 65–99)

## 2015-06-05 LAB — MAGNESIUM: MAGNESIUM: 1.4 mg/dL — AB (ref 1.7–2.4)

## 2015-06-05 LAB — PHOSPHORUS: Phosphorus: 2.3 mg/dL — ABNORMAL LOW (ref 2.5–4.6)

## 2015-06-05 MED ORDER — POTASSIUM & SODIUM PHOSPHATES 280-160-250 MG PO PACK
1.0000 | PACK | Freq: Two times a day (BID) | ORAL | Status: AC
  Administered 2015-06-05 (×2): 1 via ORAL
  Filled 2015-06-05 (×2): qty 1

## 2015-06-05 MED ORDER — MAGNESIUM SULFATE 4 GM/100ML IV SOLN
4.0000 g | Freq: Once | INTRAVENOUS | Status: AC
  Administered 2015-06-05: 4 g via INTRAVENOUS
  Filled 2015-06-05: qty 100

## 2015-06-05 NOTE — Progress Notes (Signed)
[facility] Progress Note Patient Name: Emily Pratt DOB: [birth year] MRN: [id number]   Date of Service  06/05/2015  HPI/Events of Note    eICU Interventions  CXR ordered for AM 11/10     Intervention Category Intermediate Interventions: Diagnostic test evaluation  Merton Border 06/05/2015, 12:27 AM

## 2015-06-05 NOTE — Progress Notes (Signed)
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 The patient is vent dependent. Due to the right lung atelectasis. She has completed course of chemotherapy, trach is being considered, if the patient cannot be weaned off the ventilator. Currently any surgical procedures being complicated by her thrombocytopenia, secondary to a nadir from chemotherapy   Interim history: No acute events overnight.  She continues to be on the vent, her breathing is comfortable this morning. Slow vent wean at this time, plt still decreasing.   PULMONARY -Acute respiratory failure due to complete right lung atelectasis from newly diagnosed SCLC. The patient has completed her first course of chemotherapy with 3 days of cisplatin and etoposide. Her respiratory function appears to be improved and the right lung atelectasis appears to be improving. - Pleural effusion - possible loculated in the right, may consider U/S thoracentesis once Plts are more stable.  - Vent dependent - f/u CXR in the AM -Pt has completed first course of chemo.  -ENT consulted for tracheostomy. Recommended holding off for now, Given elevated risk due to thrombocytopenia. -Patient currently in her nadir from chemotherapy, started on Neupogen 11/9. -We will continue weaning trials, and see if the patient can be extubated over the next week before any potential tracheostomy is considered again.  CARDIOVASCULAR -A. fib with RVR. Patient was previously on a Cardizem drip, then amiodarone, the rate is now well controlled and off drips.   RENAL CKD, stable.  -Hypernatremia - stable  GASTROINTESTINAL ?Ileus >> resolved on KUB NGT replacement -Restart tube feeds -Continue pepcid for GI prophylaxis.   HEMATOLOGIC R Lung Mass - SCLC Thrombocytopenia, Plt =  18 Neutropenia -s/p cisplatin/etopiside (1st round, 3 days).  - low HIT panel, if plts start to rise may consider a direct thrombin inhibitor for DVT prophylaxis -Started on Neupogen  BCx2 10/24; negative.  Bronch Cx. 05/20/15>> normal flora. AFB and fungi pending.    ENDOCRINE A:  Diabetes, hyperglycemia.  P:   continued every 4 hours sliding scale insulin. Continue Levemir.  NEUROLOGIC A: Anxiety.   MAJOR EVENTS/TEST RESULTS: Bronch 10/25 with forceps biopsy of RUL>> SCLC.   INDWELLING DEVICES:: Endotracheal tube: 10/25. CVC triple lumen: Right femoral: 10/25>>10/31 Double-lumen PICC line: 10/31.  ---------------------------------------  ----------------------------------------   Name: Emily Pratt MRN: [id number] DOB: 12-19-1945    ADMISSION DATE:  05/04/2015    SUBJECTIVE:  Pt currently on the ventilator,no acute complaints, no pain, able to communicate via writing pad.  Intubated and interactive. Tried PSV this morning, tolerated about 30 mins  Review of Systems:  GEN - no fever, no discomfort HEENT - no headache, no eye discomfort, no vision impairment CVS - no palpitations, no murmurs RESP - chronic sob,  ABD - no abd pain,  EXT - no jt pain  VITAL SIGNS: Temp:  [98.4 F (36.9 C)] 98.4 F (36.9 C) (11/09 1200) Pulse Rate:  [71-96] 96 (11/10 0900) Resp:  [10-22] 10 (11/10 0700) BP: (100-155)/(41-85) 131/71 mmHg (11/10 0900) SpO2:  [94 %-100 %] 99 % (11/10 0900) FiO2 (%):  [35 %] 35 % (11/10 0848) HEMODYNAMICS:   VENTILATOR SETTINGS: Vent Mode:  [-] PRVC FiO2 (%):  [35 %] 35 % Set Rate:  [14 bmp] 14 bmp Vt Set:  [500 mL] 500 mL PEEP:  [5 cmH20] 5 cmH20 Plateau Pressure:  [20 cmH20] 20 cmH20  INTAKE / OUTPUT:  Intake/Output Summary (Last 24 hours) at 06/05/15 1150 Last data filed at 06/05/15 0900  Gross per 24 hour  Intake 3254.3 ml  Output   2100 ml  Net 1154.3 ml    PHYSICAL EXAMINATION: Physical Examination:   VS: BP 131/71 mmHg   Pulse 96  Temp(Src) 98.4 F (36.9 C) (Oral)  Resp 10  Ht '5\' 7"'$  (1.702 m)  Wt 243 lb 13.3 oz (110.6 kg)  BMI 38.18 kg/m2  SpO2 99%  General Appearance: No distress  Neuro:without focal findings, mental status normal. HEENT: PERRLA, EOM intact. Pulmonary: normal breath sounds, decreased on right side.  CardiovascularNormal S1,S2.  No m/r/g.   Abdomen: Benign, Soft, non-tender. Renal:  No costovertebral tenderness  GU:  Not performed at this time. Endocrine: No evident thyromegaly. Skin:   warm, no rashes, no ecchymosis  Extremities: normal, no cyanosis, clubbing.   LABS:   LABORATORY PANEL:   CBC  Recent Labs Lab 06/05/15 0735  WBC 0.5*  HGB 8.1*  HCT 25.2*  PLT 18*    Chemistries   Recent Labs Lab 06/05/15 0430  NA 146*  K 3.6  CL 111  CO2 31  GLUCOSE 171*  BUN 24*  CREATININE 0.69  CALCIUM 7.8*  MG 1.4*  PHOS 2.3*     Recent Labs Lab 06/04/15 1110 06/04/15 1644 06/04/15 1951 06/05/15 0025 06/05/15 0430 06/05/15 0737  GLUCAP 157* 138* 151* 145* 147* 166*   No results for input(s): PHART, PCO2ART, PO2ART in the last 168 hours. No results for input(s): AST, ALT, ALKPHOS, BILITOT, ALBUMIN in the last 168 hours.  Cardiac Enzymes No results for input(s): TROPONINI in the last 168 hours.  RADIOLOGY:  No results found.   I have personally obtained a history, examined the patient, evaluated Pertinent laboratory and RadioGraphic/imaging results, and  formulated the assessment and plan   The Patient requires high complexity decision making for assessment and support, frequent evaluation and titration of therapies, application of advanced monitoring technologies and extensive interpretation of multiple databases. Critical Care Time devoted to patient care services described in this note is 35 minutes.   Overall, patient is critically ill, prognosis is guarded.    Vilinda Boehringer, MD [facility] Pager [phone]  (please enter 7-digits) On Call Pager - [phone] (please enter 7-digits)

## 2015-06-05 NOTE — Progress Notes (Signed)
Pharmacy Consult for Electrolyte Monitoring   Allergies  Allergen Reactions  . Atorvastatin Other (See Comments)    Does not remember why she had Intolerance to Lipitor.  . Fentanyl Itching  . Rosuvastatin Anxiety    Chest tigtness and feeling as if she had heartburn.    Patient Measurements: Height: '5\' 7"'$  (170.2 cm) Weight: 243 lb 13.3 oz (110.6 kg) IBW/kg (Calculated) : 61.6   Vital Signs: BP: 124/51 mmHg (11/10 0400) Pulse Rate: 87 (11/10 0400) Intake/Output from previous day: 11/09 0701 - 11/10 0700 In: 3142.5 [I.V.:1722.5; NG/GT:1420] Out: 1700 [Urine:1700] Intake/Output from this shift: Total I/O In: 1215 [I.V.:675; NG/GT:540] Out: 750 [Urine:750]  Labs:  Recent Labs  06/03/15 0539 06/04/15 0514  WBC 0.8* 0.6*  HGB 9.1* 8.8*  HCT 27.8* 26.7*  PLT 33* 25*     Recent Labs  06/03/15 0539 06/04/15 0514 06/05/15 0430  NA 149* 150* 146*  K 3.6 3.5 3.6  CL 115* 115* 111  CO2 '30 31 31  '$ GLUCOSE 110* 140* 171*  BUN 29* 25* 24*  CREATININE 0.70 0.68 0.69  CALCIUM 7.9* 8.0* 7.8*  MG 1.6* 1.8 1.4*  PHOS  --  2.8 2.3*   Estimated Creatinine Clearance: 87.5 mL/min (by C-G formula based on Cr of 0.69).    Recent Labs  06/04/15 1951 06/05/15 0025 06/05/15 0430  GLUCAP 151* 145* 147*    Medical History: Past Medical History  Diagnosis Date  . Anxiety   . Arthritis   . COPD (chronic obstructive pulmonary disease) ([place])   . CHF (congestive heart failure) ([place])   . Hyperlipidemia   . Hypertension   . Diabetes mellitus without complication ([place])   . Chronic kidney disease   . Neuromuscular disorder (HCC)     Medications:  Scheduled:  . allopurinol  100 mg Oral Daily  . amLODipine  5 mg Oral Daily  . antiseptic oral rinse  7 mL Mouth Rinse QID  . budesonide (PULMICORT) nebulizer solution  0.5 mg Nebulization BID  . chlorhexidine gluconate  15 mL Mouth Rinse BID  . famotidine  20 mg Per Tube BID  . filgrastim  300 mcg Subcutaneous Daily  .  fluconazole  200 mg Oral Daily  . free water  200 mL Per Tube 6 times per day  . insulin aspart  0-15 Units Subcutaneous 6 times per day  . insulin detemir  15 Units Subcutaneous Q24H  . ipratropium-albuterol  3 mL Nebulization Q6H  . losartan  100 mg Oral Daily  . magnesium sulfate 1 - 4 g bolus IVPB  4 g Intravenous Once  . metoprolol  100 mg Per Tube BID  . potassium & sodium phosphates  1 packet Oral BID WC  . pregabalin  50 mg Per Tube TID   Infusions:  . dexmedetomidine 0.8 mcg/kg/hr (06/05/15 0400)  . dextrose 75 mL (06/05/15 0140)  . feeding supplement (VITAL HIGH PROTEIN) 1,000 mL (06/04/15 1507)    Assessment: Patient started on 1/2NS @ 63m on 11/2. Patient's sodium remains elevated but stable.   Plan:  No additional supplementation warranted at this time.  Will f/u AM labs.   Pharmacy will continue to monitor labs and replace electrolytes as needed.   11/5 AM electrolyte panel WNL except for Na+ still elevated. No indication for replacement. Will f/u BMP in AM.  11/6 AM electrolyte panel WNL except for Na+ still elevated. No indication for replacement. Will f/u BMP, Mg, and PO4 in AM  11/7 Electrolyte panel WNL execpt.  Na+ still a bit elevated and Mg 1.5 Will order magnesium sulfate 2 grams IV x1. Recheck BMP and Mg in AM.  1108 AM Magnesium 1.6, will order 4 gm IV x 1 and recheck electrolytes with AM labs.  1109 AM electrolytes within normal limits, will recheck with AM labs.   1110 AM potassium 3.6, magnesium 1.4, phosphorus 2.3. Give magnesium 4 gm IV x 1 and PhosNak 1 tab BID with meals x 2 doses will recheck labs in AM.  Ovid Curd A. [place], [place].D. Clinical Pharmacist  06/05/2015

## 2015-06-05 NOTE — Progress Notes (Signed)
[facility] [place] NAME: Emily Pratt    MR#:  [id number]  DATE OF BIRTH:  Mar 02, 1946  SUBJECTIVE:  CHIEF COMPLAINT: Pt is awake and alert, on precedex and vent. On TF. REVIEW OF SYSTEMS:   Limited ROS.  No fever or chills. No chest pain, palpitation, SOB. No nausea, vomiting.  DRUG ALLERGIES:   Allergies  Allergen Reactions  . Atorvastatin Other (See Comments)    Does not remember why she had Intolerance to Lipitor.  . Fentanyl Itching  . Rosuvastatin Anxiety    Chest tigtness and feeling as if she had heartburn.    VITALS:  Blood pressure 131/71, pulse 96, temperature 98.4 F (36.9 C), temperature source Oral, resp. rate 10, height '5\' 7"'$  (1.702 m), weight 110.6 kg (243 lb 13.3 oz), SpO2 99 %.  PHYSICAL EXAMINATION:  GENERAL:  69 y.o.-year-old patient lying in the bed, awake, on vent. Obese. EYES: Pupils equal, round, reactive to light and accommodation. No scleral icterus.  HEENT: Head atraumatic, normocephalic. ETT in place NECK:  Supple, no jugular venous distention. No thyroid enlargement, no tenderness.  LUNGS: Better air entrance on the right,  diminished breath sounds on the right , no wheezing or rhonchi , good air movements on the left , on ventilator support. CARDIOVASCULAR: S1, S2 normal. No murmurs, rubs, or gallops.  ABDOMEN: Soft, nontender, distended. Bowel sounds present. No organomegaly or mass.  EXTREMITIES: +1 pedal edema, no cyanosis, or clubbing.  NEUROLOGIC: CN 2-12 intact, strength 4/5,  Follows limited commands PSYCHIATRIC: awake and alert. SKIN: Sacral decubitus ulcer    LABORATORY PANEL:   CBC  Recent Labs Lab 06/05/15 0735  WBC 0.5*  HGB 8.1*  HCT 25.2*  PLT 18*   ------------------------------------------------------------------------------------------------------------------  Chemistries   Recent Labs Lab 06/05/15 0430  NA 146*  K 3.6  CL 111  CO2 31  GLUCOSE 171*   BUN 24*  CREATININE 0.69  CALCIUM 7.8*  MG 1.4*   ------------------------------------------------------------------------------------------------------------------  Cardiac Enzymes No results for input(s): TROPONINI in the last 168 hours. ------------------------------------------------------------------------------------------------------------------  RADIOLOGY:  No results found.  EKG:   Orders placed or performed during the hospital encounter of 05/03/2015  . EKG 12-Lead  . EKG 12-Lead    ASSESSMENT AND PLAN:   1. Acute on chronic respiratory failure with hypoxia and Hypercapnia,  - Multifactorial due to large right-sided pleural effusion/ CHF/ COPD / mass- SCLC - Pulmonology and oncology is following, failed multiple extubation attempts.  - Continue nebulizer, steroids, antibiotics. Oncology initiated chemotherapy October 31st 2016 with cisplatin and etoposide with hopes to shrink the mass. Timing of shrinkage  is unclear,  chest x-ray does not show any improvement. Appreciate palliative care input , ENT saw patient and discussed this oncologist , no tracheostomy tube is recommended at this time but revisit issue in about 1 week. Try to wean off vent daily per Dr. Stevenson Clinch. chest [place]: moderate right side pleural effusion.  2. Acute Congestive heart failure: Mixed systolic and diastolic  - Echo 76/22 shows ejection fraction 30 -35% with abnormal filling - Continue metoprolol per cardiology's recommendations , resumed losartan. f/u CXR in the AM  3. Acute on chronic kidney disease:  - Due to ATN, hypovolemia and hypotension, improved. - Nephrology signed off. Patient was restarted on losartan.  discontinue IVF.  4. Essential hypertension:  continue metoprolol and losartan, norvasc, better blood pressure control  5. Diabetes mellitus type 2: controlled. Hold home medications Continue levemir 15 unit  HS and sliding scale, blood glucose is controlled. Now holding tube feed  due to mild ileus.  6.  Small cell lung cancer, likely stage IV per oncology - Status post bronchoscopy 10/25, pathology show small cell lung cancer - Long-term history of heavy smoking - pathology reports were discussed with the patient and sister in the past.   - Oncology initiated chemotherapy ,  PICC line for chemotherapy is placed 05/26/2015- Radiation oncology has been consulted and patient may benefit from radiology oncology intervention as well, since she is intubated. It might be difficult to perform. Following patient's platelet count closely with chemotherapy chest [place]: moderate right side pleural effusion.  CT chest late this week per Dr. Grayland Ormond.   7. Postobstructive pneumonia versus atelectasis - Blood cultures negative to date, respiratory, BAL, AFB normal flora - Discontinued vancomycin and Zosyn, completed 7 day Levaquin course  8. Arrhythmia, SVT versus paroxysmal atrial fibrillation during bronchoscopy, now in sinus rhythm, continue metoprolol per cardiology's recommendations  9 thrombocytopenia, following closely, may need transfusions with chemotherapy for lung ca. Worsening. F/u CBC and  Oncologist.  * Neutropenia. Worsening, continue neupogen. F/u CBC. Isolation.  10. Hypernatremia, improved/  11. UTI  Ur cx showed yeast,  Discontinue Rocephin IV, continue Diflucan.  12. Ileus Improved. Resumed TF.  * Hypomagnesemia.  Mag iv. Follow up level.    Greater than 50% time was spent on coordination of care and face-to-face counseling. Per Dr. Megan Salon, the patient might be moving to a comfort care direction. Dr. Megan Salon will come back to see the patient next week.  CODE STATUS: Full code  TOTAL CRITICAL CARE TIME TAKING CARE OF THIS PATIENT: 38 minutes.    Demetrios Loll M.D on 06/05/2015 at 12:12 PM  Between 7am to 6pm - Pager - [phone] After 6pm go to [email or web address] - password [email or web address] [facility]  Office   [phone]  CC: Primary care physician; Dagoberto Ligas, MD

## 2015-06-05 NOTE — Progress Notes (Signed)
Pharmacy Consult for Fluconazole  Indication: UTI  Allergies  Allergen Reactions  . Atorvastatin Other (See Comments)    Does not remember why she had Intolerance to Lipitor.  . Fentanyl Itching  . Rosuvastatin Anxiety    Chest tigtness and feeling as if she had heartburn.    Patient Measurements: Height: '5\' 7"'$  (170.2 cm) Weight: 243 lb 13.3 oz (110.6 kg) IBW/kg (Calculated) : 61.6  Vital Signs: BP: 131/71 mmHg (11/10 0900) Pulse Rate: 96 (11/10 0900) Intake/Output from previous day: 11/09 0701 - 11/10 0700 In: 3547.5 [I.V.:1947.5; NG/GT:1600] Out: 2100 [Urine:2100] Intake/Output from this shift: Total I/O In: 270 [I.V.:150; NG/GT:120] Out: -   Labs:  Recent Labs  06/03/15 0539 06/04/15 0514 06/05/15 0430 06/05/15 0735  WBC 0.8* 0.6*  --  0.5*  HGB 9.1* 8.8*  --  8.1*  PLT 33* 25*  --  18*  CREATININE 0.70 0.68 0.69  --    Estimated Creatinine Clearance: 87.5 mL/min (by C-G formula based on Cr of 0.69). No results for input(s): VANCOTROUGH, VANCOPEAK, VANCORANDOM, GENTTROUGH, GENTPEAK, GENTRANDOM, TOBRATROUGH, TOBRAPEAK, TOBRARND, AMIKACINPEAK, AMIKACINTROU, AMIKACIN in the last 72 hours.   Microbiology: Recent Results (from the past 720 hour(s))  MRSA PCR Screening     Status: None   Collection Time: 05/07/2015 12:28 AM  Result Value Ref Range Status   MRSA by PCR NEGATIVE NEGATIVE Final    Comment:        The GeneXpert MRSA Assay (FDA approved for NASAL specimens only), is one component of a comprehensive MRSA colonization surveillance program. It is not intended to diagnose MRSA infection nor to guide or monitor treatment for MRSA infections.   Culture, blood (routine x 2)     Status: None   Collection Time: 05/18/2015  1:13 AM  Result Value Ref Range Status   Specimen Description BLOOD RIGHT HAND  Final   Special Requests BOTTLES DRAWN AEROBIC AND ANAEROBIC 3CC  Final   Culture NO GROWTH 5 DAYS  Final   Report Status 05/24/2015 FINAL  Final   Culture, blood (routine x 2)     Status: None   Collection Time: 05/08/2015  1:13 AM  Result Value Ref Range Status   Specimen Description BLOOD RIGHT ASSIST CONTROL  Final   Special Requests BOTTLES DRAWN AEROBIC AND ANAEROBIC 4CC  Final   Culture NO GROWTH 5 DAYS  Final   Report Status 05/24/2015 FINAL  Final  Culture, expectorated sputum-assessment     Status: None   Collection Time: 05/20/15  2:00 PM  Result Value Ref Range Status   Specimen Description EXPECTORATED SPUTUM  Final   Special Requests NONE  Final   Sputum evaluation THIS SPECIMEN IS ACCEPTABLE FOR SPUTUM CULTURE  Final   Report Status 05/20/2015 FINAL  Final  Culture, respiratory (NON-Expectorated)     Status: None   Collection Time: 05/20/15  2:00 PM  Result Value Ref Range Status   Specimen Description EXPECTORATED SPUTUM  Final   Special Requests NONE Reflexed from [id number]  Final   Gram Stain   Final    FEW WBC SEEN FEW GRAM POSITIVE RODS FAIR SPECIMEN - 70-80% WBCS    Culture APPEARS TO BE NORMAL FLORA  Final   Report Status 05/22/2015 FINAL  Final  Culture, bal-quantitative     Status: None   Collection Time: 05/20/15  5:05 PM  Result Value Ref Range Status   Specimen Description BRONCHIAL ALVEOLAR LAVAGE  Final   Special Requests Normal  Final   Gram  Stain   Final    MODERATE WBC SEEN RARE GRAM POSITIVE RODS RARE GRAM POSITIVE COCCI IN CHAINS GOOD SPECIMEN - 80-90% WBCS    Culture APPEARS TO BE NORMAL FLORA  Final   Report Status 05/22/2015 FINAL  Final  Fungus Culture with Smear     Status: None (Preliminary result)   Collection Time: 05/20/15  5:05 PM  Result Value Ref Range Status   Specimen Description SPUTUM  Final   Special Requests Normal  Final   Culture CANDIDA DUBLINIENSIS  Final   Report Status PENDING  Incomplete  C difficile quick scan w PCR reflex     Status: None   Collection Time: 05/29/15  4:50 PM  Result Value Ref Range Status   C Diff antigen NEGATIVE NEGATIVE Final   C Diff  toxin NEGATIVE NEGATIVE Final   C Diff interpretation Negative for C. difficile  Final  Urine culture     Status: None   Collection Time: 05/29/15  6:19 PM  Result Value Ref Range Status   Specimen Description URINE, RANDOM  Final   Special Requests NONE  Final   Culture >=100,000 COLONIES/mL CANDIDA ALBICANS  Final   Report Status 06/04/2015 FINAL  Final    Medical History: Past Medical History  Diagnosis Date  . Anxiety   . Arthritis   . COPD (chronic obstructive pulmonary disease) ([place])   . CHF (congestive heart failure) ([place])   . Hyperlipidemia   . Hypertension   . Diabetes mellitus without complication ([place])   . Chronic kidney disease   . Neuromuscular disorder (HCC)     Medications:  Scheduled:  . allopurinol  100 mg Oral Daily  . amLODipine  5 mg Oral Daily  . antiseptic oral rinse  7 mL Mouth Rinse QID  . budesonide (PULMICORT) nebulizer solution  0.5 mg Nebulization BID  . chlorhexidine gluconate  15 mL Mouth Rinse BID  . famotidine  20 mg Per Tube BID  . filgrastim  300 mcg Subcutaneous Daily  . fluconazole  200 mg Oral Daily  . free water  200 mL Per Tube 6 times per day  . insulin aspart  0-15 Units Subcutaneous 6 times per day  . insulin detemir  15 Units Subcutaneous Q24H  . ipratropium-albuterol  3 mL Nebulization Q6H  . losartan  100 mg Oral Daily  . metoprolol  100 mg Per Tube BID  . potassium & sodium phosphates  1 packet Oral BID WC  . pregabalin  50 mg Per Tube TID   Infusions:  . dexmedetomidine 0.8 mcg/kg/hr (06/05/15 1202)  . feeding supplement (VITAL HIGH PROTEIN) 1,000 mL (06/04/15 1507)   Assessment: 69 y/o Female, day 15 of hospital stay. Patient  with SCLC and currently intubated. Urine cultures from 11/3 growing yeast. Pharmacy consulted for dosing of fluconazole. Patient received '400mg'$  IV x 2 doses.     Plan:  Will continue patients dose to fluconazole '200mg'$  po daily. . Duration of treatment is 2 weeks.  Pharmacy will continue to  monitor patients labs and renal function and make adjustments as needed.   Ulice Dash, PharmD

## 2015-06-05 NOTE — Progress Notes (Signed)
Patient remains on Ventilator. SBT, patient tolerated well today for 5 hours, then became tachycardic and anxious, placed back on rate by Angie, RT. SR on cardiac monitor, TF infusing through OG. Precedex at .8 mcg, D5 at 75. Foley catheter with good UOP.

## 2015-06-06 ENCOUNTER — Inpatient Hospital Stay: Payer: Medicare Other

## 2015-06-06 ENCOUNTER — Encounter: Payer: Self-pay | Admitting: Radiology

## 2015-06-06 LAB — BASIC METABOLIC PANEL
Anion gap: 9 (ref 5–15)
BUN: 25 mg/dL — AB (ref 6–20)
CHLORIDE: 107 mmol/L (ref 101–111)
CO2: 28 mmol/L (ref 22–32)
CREATININE: 0.68 mg/dL (ref 0.44–1.00)
Calcium: 7.7 mg/dL — ABNORMAL LOW (ref 8.9–10.3)
GFR calc Af Amer: >60 mL/min (ref >60)
GFR calc non Af Amer: >60 mL/min (ref >60)
GLUCOSE: 149 mg/dL — AB (ref 65–99)
POTASSIUM: 3.3 mmol/L — AB (ref 3.5–5.1)
SODIUM: 144 mmol/L (ref 135–145)

## 2015-06-06 LAB — CBC
HCT: 22.5 % — ABNORMAL LOW (ref 35.0–47.0)
Hemoglobin: 7.4 g/dL — ABNORMAL LOW (ref 12.0–16.0)
MCH: 29.1 pg (ref 26.0–34.0)
MCHC: 32.7 g/dL (ref 32.0–36.0)
MCV: 89 fL (ref 80.0–100.0)
Platelets: 16 10*3/uL — CL (ref 150–440)
RBC: 2.53 MIL/uL — ABNORMAL LOW (ref 3.80–5.20)
RDW: 17.4 % — ABNORMAL HIGH (ref 11.5–14.5)
WBC: 0.5 10*3/uL — CL (ref 3.6–11.0)

## 2015-06-06 LAB — PHOSPHORUS: Phosphorus: 2.3 mg/dL — ABNORMAL LOW (ref 2.5–4.6)

## 2015-06-06 LAB — GLUCOSE, CAPILLARY
Glucose-Capillary: 157 mg/dL — ABNORMAL HIGH (ref 65–99)
Glucose-Capillary: 115 mg/dL — ABNORMAL HIGH (ref 65–99)
Glucose-Capillary: 137 mg/dL — ABNORMAL HIGH (ref 65–99)
Glucose-Capillary: 146 mg/dL — ABNORMAL HIGH (ref 65–99)
Glucose-Capillary: 69 mg/dL (ref 65–99)

## 2015-06-06 LAB — MAGNESIUM: Magnesium: 1.6 mg/dL — ABNORMAL LOW (ref 1.7–2.4)

## 2015-06-06 MED ORDER — FAMOTIDINE IN NACL 20-0.9 MG/50ML-% IV SOLN
20.0000 mg | Freq: Two times a day (BID) | INTRAVENOUS | Status: DC
Start: 2015-06-06 — End: 2015-06-16
  Administered 2015-06-06 – 2015-06-15 (×20): 20 mg via INTRAVENOUS
  Filled 2015-06-06 (×24): qty 50

## 2015-06-06 MED ORDER — DEXTROSE-NACL 5-0.45 % IV SOLN
INTRAVENOUS | Status: DC
Start: 2015-06-06 — End: 2015-06-07
  Administered 2015-06-06: 22:00:00 via INTRAVENOUS

## 2015-06-06 MED ORDER — MAGNESIUM SULFATE 2 GM/50ML IV SOLN
2.0000 g | Freq: Once | INTRAVENOUS | Status: AC
  Administered 2015-06-06: 2 g via INTRAVENOUS
  Filled 2015-06-06: qty 50

## 2015-06-06 MED ORDER — POTASSIUM PHOSPHATES 15 MMOLE/5ML IV SOLN
15.0000 mmol | Freq: Once | INTRAVENOUS | Status: AC
  Administered 2015-06-06: 15 mmol via INTRAVENOUS
  Filled 2015-06-06: qty 5

## 2015-06-06 MED ORDER — IOHEXOL 300 MG/ML  SOLN
75.0000 mL | Freq: Once | INTRAMUSCULAR | Status: AC | PRN
  Administered 2015-06-06: 75 mL via INTRAVENOUS

## 2015-06-06 NOTE — Progress Notes (Signed)
Called Dr Bridgett Larsson regarding patients abdominal distention and lower abdominal tenderness. Stopped tube feeds getting abdominal xray.

## 2015-06-06 NOTE — Progress Notes (Signed)
   06/06/15 0900  Clinical Encounter Type  Visited With Patient  Visit Type Follow-up  Consult/Referral To Chaplain  Spiritual Encounters  Spiritual Needs Emotional  Stress Factors  Patient Stress Factors None identified  Chaplain rounded in the unit and offered a compassionate presence and offered spiritual support as applicable. Chaplain Zeferino Mounts A. Kyarah Enamorado Ext. [phone]

## 2015-06-06 NOTE — [facility] Notes (Signed)
[facility] [place] NAME: Emily Pratt    MR#:  [id number]  DATE OF BIRTH:  [birth year]  SUBJECTIVE:  CHIEF COMPLAINT: Pt is awake and alert, on precedex and vent. On TF. REVIEW OF SYSTEMS:   Limited ROS.  No fever or chills. No chest pain, palpitation, SOB. No nausea, vomiting.  DRUG ALLERGIES:   Allergies  Allergen Reactions  . Atorvastatin Other (See Comments)    Does not remember why she had Intolerance to Lipitor.  . Fentanyl Itching  . Rosuvastatin Anxiety    Chest tigtness and feeling as if she had heartburn.    VITALS:  Blood pressure 129/61, pulse 84, temperature 97.7 F (36.5 C), temperature source Axillary, resp. rate 20, height '5\' 7"'$  (1.702 m), weight 110 kg (242 lb 8.1 oz), SpO2 96 %.  PHYSICAL EXAMINATION:  GENERAL:  69 y.o.-year-old patient lying in the bed, awake, on vent. Obese. EYES: Pupils equal, round, reactive to light and accommodation. No scleral icterus.  HEENT: Head atraumatic, normocephalic. ETT in place NECK:  Supple, no jugular venous distention. No thyroid enlargement, no tenderness.  LUNGS: bilateral air entrance on the right,  diminished breath sounds on the right , no wheezing or rhonchi , good air movements on the left , on ventilator support. CARDIOVASCULAR: S1, S2 normal. No murmurs, rubs, or gallops.  ABDOMEN: Soft, nontender, distended. Bowel sounds present. No organomegaly or mass.  EXTREMITIES: +1 pedal edema, no cyanosis, or clubbing.  NEUROLOGIC: CN 2-12 intact, strength 4/5,  Follows limited commands PSYCHIATRIC: awake and alert. SKIN: Sacral decubitus ulcer    LABORATORY PANEL:   CBC  Recent Labs Lab 06/06/15 0700  WBC 0.5*  HGB 7.4*  HCT 22.5*  PLT 16*   ------------------------------------------------------------------------------------------------------------------  Chemistries   Recent Labs Lab 06/06/15 0606  NA 144  K 3.3*  CL 107  CO2 28  GLUCOSE 149*   BUN 25*  CREATININE 0.68  CALCIUM 7.7*  MG 1.6*   ------------------------------------------------------------------------------------------------------------------  Cardiac Enzymes No results for input(s): TROPONINI in the last 168 hours. ------------------------------------------------------------------------------------------------------------------  RADIOLOGY:  Ct Chest W Contrast  06/06/2015  CLINICAL DATA:  Followup chest radiograph. History of CHF, COPD and hypertension. EXAM: CT CHEST WITH CONTRAST TECHNIQUE: Multidetector CT imaging of the chest was performed during intravenous contrast administration. CONTRAST:  59m OMNIPAQUE IOHEXOL 300 MG/ML  SOLN COMPARISON:  05/20/2015 FINDINGS: Mediastinum: The heart size is normal. No pericardial effusion. There is an endotracheal tube with tip above the carina. The nasogastric tube is present with tip in the body of the stomach. The index right supraclavicular lymph node measures 1.5 cm, image 5 of series 2. Previously 2.6 cm. Right paratracheal lymph node measure 2.0 cm, image 19 of series 2. Previously 3.3 cm. Pre-vascular lymph node measures 1.6 cm, image 25 of series 2. Previously 2.3 cm. Lungs/Pleura: There is a moderate size partially loculated right pleural effusion which has decreased in volume from previous exam. There has been interval improvement in aeration to the right upper lobe and superior segment of right lower lobe compatible with decrease in size of obstructing central right lung mass. Continued complete atelectasis and consolidation of the right middle lobe. The right lung mass is difficult to visualize and accurately remeasure. Mild atelectasis is noted within the posterior and medial left lower lobe. Upper Abdomen: Stable appearance of left adrenal nodule measuring 2.9 x 1.3 cm. The right adrenal gland is normal. Low to intermediate attenuation structure within the posterior right hepatic  lobe measures 2.6 cm, image 57 of  series 2. Unchanged from previous exam. There is mild perihepatic ascites noted. Musculoskeletal: Scoliosis deformity involves the thoracic spine. IMPRESSION: 1. Interval response to therapy. Since the previous exam there has been improved aeration to the right upper lobe and superior segment of the right lower lobe. Findings are compatible with decrease in size of central obstructing right perihilar lesion 2. Decrease in size of mediastinal and right supraclavicular adenopathy. 3. No new or progressive disease identified. Electronically Signed   By: Kerby Moors M.D.   On: 06/06/2015 10:39   Dg Chest Port 1 View  06/06/2015  CLINICAL DATA:  Respiratory failure . EXAM: PORTABLE CHEST 1 VIEW COMPARISON:  06/02/2015.  CT 05/20/2015 . FINDINGS: Endotracheal tube 8 cm above the carina at the thoracic inlet. Slight interim advancement should be considered. NG tube and right PICC line in stable position. Progressive consolidation/ atelectasis of the right lung and progressive right pleural effusion. Right pleural effusion is large. Left lung is clear. Stable cardiomegaly. IMPRESSION: 1. Endotracheal tube 8 cm above the carina at the thoracic inlet. Slightly mid aspect should be considered. NG tube and right PICC line stable position. 2. Progression of consolidation/atelectasis of the right lung with progression of the right pleural effusion. The right pleural effusion is large. 3. Stable cardiomegaly. Electronically Signed   By: Marcello Moores  Register   On: 06/06/2015 07:11    EKG:   Orders placed or performed during the hospital encounter of 05/20/2015  . EKG 12-Lead  . EKG 12-Lead    ASSESSMENT AND PLAN:   1. Acute on chronic respiratory failure with hypoxia and Hypercapnia,  - Multifactorial due to large right-sided pleural effusion/ CHF/ COPD / mass- SCLC - Pulmonology and oncology is following, failed multiple extubation attempts.  - Continue nebulizer, steroids, antibiotics. Oncology initiated  chemotherapy October 31st 2016 with cisplatin and etoposide with hopes to shrink the mass. Timing of shrinkage  is unclear,  chest x-ray does not show any improvement. Appreciate palliative care input , ENT saw patient and discussed this oncologist , no tracheostomy tube is recommended at this time but revisit issue in about 1 week. chest [place]: moderate right side pleural effusion.  continue weaning trials, and see if the patient can be extubated over the next week before any potential tracheostomy is considered again per  Dr. Stevenson Clinch.   2. Acute Congestive heart failure: Mixed systolic and diastolic  - Echo 47/42 shows ejection fraction 30 -35% with abnormal filling - Continue metoprolol per cardiology's recommendations , resumed losartan.  3. Acute on chronic kidney disease:  - Due to ATN, hypovolemia and hypotension, improved. - Nephrology signed off. Patient was restarted on losartan.  discontinue IVF.  4. Essential hypertension:  continue metoprolol and losartan, norvasc, better blood pressure control  5. Diabetes mellitus type 2: controlled. Hold home medications Continue levemir 15 unit HS and sliding scale, blood glucose is controlled. Now holding tube feed due to mild ileus.  6.  Small cell lung cancer, likely stage IV per oncology - Status post bronchoscopy 10/25, pathology show small cell lung cancer - Long-term history of heavy smoking - pathology reports were discussed with the patient and sister in the past.   - Oncology initiated chemotherapy ,  PICC line for chemotherapy is placed 05/26/2015- Radiation oncology has been consulted and patient may benefit from radiology oncology intervention as well, since she is intubated. It might be difficult to perform. Following patient's platelet count closely with chemotherapy chest  [place]: moderate right side pleural effusion.  CT chest: 1. Interval response to therapy. Since the previous exam there has been improved aeration to the right  upper lobe and superior segment of the right lower lobe. Findings are compatible with decrease in size of central obstructing right perihilar lesion 2. Decrease in size of mediastinal and right supraclavicular adenopathy. 3. No new or progressive disease identified.   7. Postobstructive pneumonia versus atelectasis - Blood cultures negative to date, respiratory, BAL, AFB normal flora - Discontinued vancomycin and Zosyn, completed 7 day Levaquin course  8. Arrhythmia, SVT versus paroxysmal atrial fibrillation during bronchoscopy, now in sinus rhythm, continue metoprolol per cardiology's recommendations  9 thrombocytopenia, following closely, may need transfusions with chemotherapy for lung ca. Worsening. F/u CBC and  Oncologist. No platelet transfusion at this time per Dr. Stevenson Clinch.  * Neutropenia. Worsening, continue neupogen. F/u CBC. Isolation.  10. Hypernatremia, improved/  11. UTI  Ur cx showed yeast,  Discontinue Rocephin IV, continue Diflucan.  12. Ileus Improved. Resumed TF.  Hypokalemia. KCl. F/u BMP. * Hypomagnesemia.  Mag iv. Follow up level.    Greater than 50% time was spent on coordination of care and face-to-face counseling. Per Dr. Megan Salon, the patient might be moving to a comfort care direction. Dr. Megan Salon will come back to see the patient next week.  CODE STATUS: Full code  TOTAL CRITICAL CARE TIME TAKING CARE OF THIS PATIENT: 37 minutes.    Demetrios Loll M.D on 06/06/2015 at 1:34 PM  Between 7am to 6pm - Pager - [phone] After 6pm go to [email or web address] - password [email or web address] [facility]  Office  [phone]  CC: Primary care physician; Dagoberto Ligas, MD

## 2015-06-06 NOTE — [facility] Notes (Signed)
Spoke with Dr Bridgett Larsson regarding xray results. Orders to hold tube feeds and advance OG tube 6 cm.  At lip tube is 60cm after advanced

## 2015-06-06 NOTE — [facility] Notes (Signed)
RT to room to place patient on trilogy transport vent to go for CT of chest.  Patient suctioned prior to transport by Katharine Look, Therapist, sports.  Patient tolerated transport to and from CT without complication.  Placed patient back on Servo vent upon arrival back to [place].  Will continue to monitor.

## 2015-06-06 NOTE — [facility] Notes (Signed)
Pharmacy Consult for Electrolyte Monitoring   Allergies  Allergen Reactions  . Atorvastatin Other (See Comments)    Does not remember why she had Intolerance to Lipitor.  . Fentanyl Itching  . Rosuvastatin Anxiety    Chest tigtness and feeling as if she had heartburn.    Patient Measurements: Height: '5\' 7"'$  (170.2 cm) Weight: 242 lb 8.1 oz (110 kg) IBW/kg (Calculated) : 61.6   Vital Signs: Temp: 97.6 F (36.4 C) (11/10 2019) Temp Source: Axillary (11/10 2019) BP: 125/61 mmHg (11/11 0200) Pulse Rate: 90 (11/11 0200) Intake/Output from previous day: 11/10 0701 - 11/11 0700 In: 670 [I.V.:150; NG/GT:520] Out: 700 [Urine:700] Intake/Output from this shift:    Labs:  Recent Labs  06/04/15 0514 06/05/15 0735  WBC 0.6* 0.5*  HGB 8.8* 8.1*  HCT 26.7* 25.2*  PLT 25* 18*     Recent Labs  06/04/15 0514 06/05/15 0430 06/06/15 0606  NA 150* 146* 144  K 3.5 3.6 3.3*  CL 115* 111 107  CO2 '31 31 28  '$ GLUCOSE 140* 171* 149*  BUN 25* 24* 25*  CREATININE 0.68 0.69 0.68  CALCIUM 8.0* 7.8* 7.7*  MG 1.8 1.4* 1.6*  PHOS 2.8 2.3* 2.3*   Estimated Creatinine Clearance: 87.3 mL/min (by C-G formula based on Cr of 0.68).    Recent Labs  06/05/15 1935 06/05/15 2319 06/06/15 0409  GLUCAP 156* 142* 157*    Medical History: Past Medical History  Diagnosis Date  . Anxiety   . Arthritis   . COPD (chronic obstructive pulmonary disease) ([facility])   . CHF (congestive heart failure) ([facility])   . Hyperlipidemia   . Hypertension   . Diabetes mellitus without complication ([facility])   . Chronic kidney disease   . Neuromuscular disorder ([facility])     Medications:  Scheduled:  . allopurinol  100 mg Oral Daily  . amLODipine  5 mg Oral Daily  . antiseptic oral rinse  7 mL Mouth Rinse QID  . budesonide (PULMICORT) nebulizer solution  0.5 mg Nebulization BID  . chlorhexidine gluconate  15 mL Mouth Rinse BID  . famotidine  20 mg Per Tube BID  . filgrastim  300 mcg Subcutaneous Daily  .  fluconazole  200 mg Oral Daily  . free water  200 mL Per Tube 6 times per day  . insulin aspart  0-15 Units Subcutaneous 6 times per day  . insulin detemir  15 Units Subcutaneous Q24H  . ipratropium-albuterol  3 mL Nebulization Q6H  . losartan  100 mg Oral Daily  . magnesium sulfate 1 - 4 g bolus IVPB  2 g Intravenous Once  . metoprolol  100 mg Per Tube BID  . potassium phosphate IVPB (mmol)  15 mmol Intravenous Once  . pregabalin  50 mg Per Tube TID   Infusions:  . dexmedetomidine 0.799 mcg/kg/hr (06/06/15 0242)  . feeding supplement (VITAL HIGH PROTEIN) 1,000 mL (06/04/15 1507)    Assessment: Pharmacy consulted to assist in managing electrolytes in this 69 y/o F with SCLC currently intubated.   Plan:  Phos, magnesium, and potassium are low so will replace with 15 mmol Kphos and 2 mg magnesium sulfate. Will recheck labs in AM.   Ulice Dash, PharmD 06/06/2015

## 2015-06-06 NOTE — [facility] Notes (Signed)
[facility] Progess Note   ASSESSMENT/PLAN   Patient is a 69 year old female with newly diagnosed right complete lung atelectasis due to small cell lung cancer, which is newly diagnosed on this admission. The patient is vent dependent. Due to the right lung atelectasis. She has completed course of chemotherapy, trach is being considered, if the patient cannot be weaned off the ventilator. Currently any surgical procedures being complicated by her thrombocytopenia, secondary to a nadir from chemotherapy    PULMONARY -Acute respiratory failure due to complete right lung atelectasis from newly diagnosed SCLC. The patient has completed her first course of chemotherapy with 3 days of cisplatin and etoposide. Her respiratory function appears to be improved and the right lung atelectasis appears to be improving, however difficult wean off the ventilator and will require tracheostomy - Pleural effusion - possible loculated in the right, may consider U/S thoracentesis once Plts are more stable.  - Vent dependent - CT chest 11/11>>improved RUL and RLL aeration, dec in mediastinal/supraclavicular adenopathy, favorable response to chemo. -Pt has completed first course of chemo.  -ENT consulted for tracheostomy. Recommended holding off for now, Given elevated risk due to thrombocytopenia. -Patient currently in her nadir from chemotherapy, started on Neupogen 11/9. Plt 16K -We will continue weaning trials, and see if the patient can be extubated over the next week before any potential tracheostomy is considered again.  CARDIOVASCULAR -A. fib with RVR. Patient was previously on a Cardizem drip, then amiodarone, the rate is now well controlled and off drips.   RENAL CKD, stable.  -Hypernatremia - stable  GASTROINTESTINAL ?Ileus >> resolved on KUB NGT replacement -Restart tube feeds -Continue pepcid for GI prophylaxis.   HEMATOLOGIC R Lung Mass - SCLC Thrombocytopenia, Plt  = 16 Neutropenia -s/p cisplatin/etopiside (1st round, 3 days).  - low HIT panel, if plts start to rise may consider a direct thrombin inhibitor for DVT prophylaxis -Started on Neupogen - dec size of lung ca based on CT chest 11/11 - consider plt transfusion if level is <10K   INFECTIOUS BCx2 10/24; negative.  Bronch Cx. 05/20/15>> normal flora. AFB and fungi pending.    ENDOCRINE A:  Diabetes, hyperglycemia.  P:   continued every 4 hours sliding scale insulin. Continue Levemir.  NEUROLOGIC A: Anxiety.   MAJOR EVENTS/TEST RESULTS: Bronch 10/25 with forceps biopsy of RUL>> SCLC.   INDWELLING DEVICES:: Endotracheal tube: 10/25. CVC triple lumen: Right femoral: 10/25>>10/31 Double-lumen PICC line: 10/31.  ---------------------------------------  ----------------------------------------   Name: Emily Pratt MRN: [id number] DOB: February 18, 1946    ADMISSION DATE:  05/11/2015  SUBJECTIVE:  Patient on ventilator, no acute overnight events. Plt still low at 16k, will continue with PSV as tolerated.  Review of Systems:  GEN - no fever, no discomfort HEENT - no headache, no eye discomfort, no vision impairment CVS - no palpitations, no murmurs RESP - chronic sob,  ABD - no abd pain,  EXT - no jt pain  VITAL SIGNS: Temp:  [97.6 F (36.4 C)-97.7 F (36.5 C)] 97.7 F (36.5 C) (11/11 0800) Pulse Rate:  [80-144] 84 (11/11 0800) Resp:  [18-24] 20 (11/11 0800) BP: (94-154)/(51-77) 129/61 mmHg (11/11 0800) SpO2:  [83 %-100 %] 96 % (11/11 0846) FiO2 (%):  [35 %-100 %] 40 % (11/11 0848) Weight:  [242 lb 8.1 oz (110 kg)] 242 lb 8.1 oz (110 kg) (11/11 0427) HEMODYNAMICS:   VENTILATOR SETTINGS: Vent Mode:  [-] PRVC FiO2 (%):  [35 %-100 %] 40 % Set Rate:  [40  bmp] 14 bmp Vt Set:  [500 mL] 500 mL PEEP:  [5 cmH20] 5 cmH20 Pressure Support:  [15 cmH20] 15 cmH20 Plateau Pressure:  [19 cmH20] 19 cmH20 INTAKE / OUTPUT:  Intake/Output Summary (Last 24 hours) at 06/06/15  0859 Last data filed at 06/05/15 2019  Gross per 24 hour  Intake    535 ml  Output    700 ml  Net   -165 ml    PHYSICAL EXAMINATION: Physical Examination:   VS: BP 129/61 mmHg  Pulse 84  Temp(Src) 97.7 F (36.5 C) (Axillary)  Resp 20  Ht '5\' 7"'$  (1.702 m)  Wt 242 lb 8.1 oz (110 kg)  BMI 37.97 kg/m2  SpO2 96%  General Appearance: No distress  Neuro:without focal findings, mental status normal. HEENT: PERRLA, EOM intact. Pulmonary: normal breath sounds, decreased on right side.  CardiovascularNormal S1,S2.  No m/r/g.   Abdomen: Benign, Soft, non-tender. Renal:  No costovertebral tenderness  GU:  Not performed at this time. Endocrine: No evident thyromegaly. Skin:   warm, no rashes, no ecchymosis  Extremities: normal, no cyanosis, clubbing.   LABS:   LABORATORY PANEL:   CBC  Recent Labs Lab 06/06/15 0700  WBC 0.5*  HGB 7.4*  HCT 22.5*  PLT 16*    Chemistries   Recent Labs Lab 06/06/15 0606  NA 144  K 3.3*  CL 107  CO2 28  GLUCOSE 149*  BUN 25*  CREATININE 0.68  CALCIUM 7.7*  MG 1.6*  PHOS 2.3*     Recent Labs Lab 06/05/15 1150 06/05/15 1547 06/05/15 1935 06/05/15 2319 06/06/15 0409 06/06/15 0729  GLUCAP 189* 192* 156* 142* 157* 115*   No results for input(s): PHART, PCO2ART, PO2ART in the last 168 hours. No results for input(s): AST, ALT, ALKPHOS, BILITOT, ALBUMIN in the last 168 hours.  Cardiac Enzymes No results for input(s): TROPONINI in the last 168 hours.  RADIOLOGY:  Dg Chest Port 1 View  06/06/2015  CLINICAL DATA:  Respiratory failure . EXAM: PORTABLE CHEST 1 VIEW COMPARISON:  06/02/2015.  CT 05/20/2015 . FINDINGS: Endotracheal tube 8 cm above the carina at the thoracic inlet. Slight interim advancement should be considered. NG tube and right PICC line in stable position. Progressive consolidation/ atelectasis of the right lung and progressive right pleural effusion. Right pleural effusion is large. Left lung is clear. Stable  cardiomegaly. IMPRESSION: 1. Endotracheal tube 8 cm above the carina at the thoracic inlet. Slightly mid aspect should be considered. NG tube and right PICC line stable position. 2. Progression of consolidation/atelectasis of the right lung with progression of the right pleural effusion. The right pleural effusion is large. 3. Stable cardiomegaly. Electronically Signed   By: Marcello Moores  Register   On: 06/06/2015 07:11     I have personally obtained a history, examined the patient, evaluated Pertinent laboratory and RadioGraphic/imaging results, and  formulated the assessment and plan   The Patient requires high complexity decision making for assessment and support, frequent evaluation and titration of therapies, application of advanced monitoring technologies and extensive interpretation of multiple databases. Critical Care Time devoted to patient care services described in this note is 35 minutes.   Overall, patient is critically ill, prognosis is guarded.    Vilinda Boehringer, MD [facility] Pager [phone] (please enter 7-digits) On Call Pager - [phone] (please enter 7-digits)

## 2015-06-06 NOTE — Progress Notes (Signed)
Pharmacy Consult for Fluconazole  Indication: UTI  Allergies  Allergen Reactions  . Atorvastatin Other (See Comments)    Does not remember why she had Intolerance to Lipitor.  . Fentanyl Itching  . Rosuvastatin Anxiety    Chest tigtness and feeling as if she had heartburn.    Patient Measurements: Height: '5\' 7"'$  (170.2 cm) Weight: 242 lb 8.1 oz (110 kg) IBW/kg (Calculated) : 61.6  Vital Signs: Temp: 97.7 F (36.5 C) (11/11 0800) BP: 129/61 mmHg (11/11 0800) Pulse Rate: 84 (11/11 0800) Intake/Output from previous day: 11/10 0701 - 11/11 0700 In: 670 [I.V.:150; NG/GT:520] Out: 700 [Urine:700] Intake/Output from this shift:    Labs:  Recent Labs  06/04/15 0514 06/05/15 0430 06/05/15 0735 06/06/15 0606 06/06/15 0700  WBC 0.6*  --  0.5*  --  0.5*  HGB 8.8*  --  8.1*  --  7.4*  PLT 25*  --  18*  --  16*  CREATININE 0.68 0.69  --  0.68  --    Estimated Creatinine Clearance: 87.3 mL/min (by C-G formula based on Cr of 0.68). No results for input(s): VANCOTROUGH, VANCOPEAK, VANCORANDOM, GENTTROUGH, GENTPEAK, GENTRANDOM, TOBRATROUGH, TOBRAPEAK, TOBRARND, AMIKACINPEAK, AMIKACINTROU, AMIKACIN in the last 72 hours.   Microbiology: Recent Results (from the past 720 hour(s))  MRSA PCR Screening     Status: None   Collection Time: 04/26/2015 12:28 AM  Result Value Ref Range Status   MRSA by PCR NEGATIVE NEGATIVE Final    Comment:        The GeneXpert MRSA Assay (FDA approved for NASAL specimens only), is one component of a comprehensive MRSA colonization surveillance program. It is not intended to diagnose MRSA infection nor to guide or monitor treatment for MRSA infections.   Culture, blood (routine x 2)     Status: None   Collection Time: 05/17/2015  1:13 AM  Result Value Ref Range Status   Specimen Description BLOOD RIGHT HAND  Final   Special Requests BOTTLES DRAWN AEROBIC AND ANAEROBIC 3CC  Final   Culture NO GROWTH 5 DAYS  Final   Report Status 05/24/2015 FINAL   Final  Culture, blood (routine x 2)     Status: None   Collection Time: 05/06/2015  1:13 AM  Result Value Ref Range Status   Specimen Description BLOOD RIGHT ASSIST CONTROL  Final   Special Requests BOTTLES DRAWN AEROBIC AND ANAEROBIC 4CC  Final   Culture NO GROWTH 5 DAYS  Final   Report Status 05/24/2015 FINAL  Final  Culture, expectorated sputum-assessment     Status: None   Collection Time: 05/20/15  2:00 PM  Result Value Ref Range Status   Specimen Description EXPECTORATED SPUTUM  Final   Special Requests NONE  Final   Sputum evaluation THIS SPECIMEN IS ACCEPTABLE FOR SPUTUM CULTURE  Final   Report Status 05/20/2015 FINAL  Final  Culture, respiratory (NON-Expectorated)     Status: None   Collection Time: 05/20/15  2:00 PM  Result Value Ref Range Status   Specimen Description EXPECTORATED SPUTUM  Final   Special Requests NONE Reflexed from [id number]  Final   Gram Stain   Final    FEW WBC SEEN FEW GRAM POSITIVE RODS FAIR SPECIMEN - 70-80% WBCS    Culture APPEARS TO BE NORMAL FLORA  Final   Report Status 05/22/2015 FINAL  Final  Culture, bal-quantitative     Status: None   Collection Time: 05/20/15  5:05 PM  Result Value Ref Range Status   Specimen Description BRONCHIAL  ALVEOLAR LAVAGE  Final   Special Requests Normal  Final   Gram Stain   Final    MODERATE WBC SEEN RARE GRAM POSITIVE RODS RARE GRAM POSITIVE COCCI IN CHAINS GOOD SPECIMEN - 80-90% WBCS    Culture APPEARS TO BE NORMAL FLORA  Final   Report Status 05/22/2015 FINAL  Final  Fungus Culture with Smear     Status: None (Preliminary result)   Collection Time: 05/20/15  5:05 PM  Result Value Ref Range Status   Specimen Description SPUTUM  Final   Special Requests Normal  Final   Culture CANDIDA DUBLINIENSIS  Final   Report Status PENDING  Incomplete  C difficile quick scan w PCR reflex     Status: None   Collection Time: 05/29/15  4:50 PM  Result Value Ref Range Status   C Diff antigen NEGATIVE NEGATIVE Final   C  Diff toxin NEGATIVE NEGATIVE Final   C Diff interpretation Negative for C. difficile  Final  Urine culture     Status: None   Collection Time: 05/29/15  6:19 PM  Result Value Ref Range Status   Specimen Description URINE, RANDOM  Final   Special Requests NONE  Final   Culture >=100,000 COLONIES/mL CANDIDA ALBICANS  Final   Report Status 06/04/2015 FINAL  Final    Medical History: Past Medical History  Diagnosis Date  . Anxiety   . Arthritis   . COPD (chronic obstructive pulmonary disease) ([place])   . CHF (congestive heart failure) ([place])   . Hyperlipidemia   . Hypertension   . Diabetes mellitus without complication ([place])   . Chronic kidney disease   . Neuromuscular disorder (HCC)     Medications:  Scheduled:  . allopurinol  100 mg Oral Daily  . amLODipine  5 mg Oral Daily  . antiseptic oral rinse  7 mL Mouth Rinse QID  . budesonide (PULMICORT) nebulizer solution  0.5 mg Nebulization BID  . chlorhexidine gluconate  15 mL Mouth Rinse BID  . filgrastim  300 mcg Subcutaneous Daily  . fluconazole  200 mg Oral Daily  . free water  200 mL Per Tube 6 times per day  . insulin aspart  0-15 Units Subcutaneous 6 times per day  . insulin detemir  15 Units Subcutaneous Q24H  . ipratropium-albuterol  3 mL Nebulization Q6H  . losartan  100 mg Oral Daily  . metoprolol  100 mg Per Tube BID  . potassium phosphate IVPB (mmol)  15 mmol Intravenous Once  . pregabalin  50 mg Per Tube TID   Infusions:  . dexmedetomidine 0.4 mcg/kg/hr (06/06/15 1011)  . feeding supplement (VITAL HIGH PROTEIN) 1,000 mL (06/04/15 1507)   Assessment: 69 y/o Female, day 15 of hospital stay. Patient  with SCLC and currently intubated. Urine cultures from 11/3 growing yeast. Pharmacy consulted for dosing of fluconazole. Patient received '400mg'$  IV x 2 doses.   Day 5 of therapy.   Plan:  Will continue patients dose to fluconazole '200mg'$  po daily.Duration of treatment is 2 weeks. Target stop date 06/15/15. Pharmacy  will continue to monitor patients labs and renal function and make adjustments as needed.   Nancy Fetter, PharmD Pharmacy Resident

## 2015-06-06 NOTE — Progress Notes (Signed)
Nutrition Follow-up    INTERVENTION:   EN: recommend continuing current TF regimen with current free water flushes   NUTRITION DIAGNOSIS:   Inadequate oral intake related to acute illness as evidenced by NPO status.  GOAL:   Provide needs based on ASPEN/SCCM guidelines  MONITOR:    (Energy Intake, Anthropometrics, Electrolyte/Renal Profile, Digestive System, Electrolyte/Renal Profile)  REASON FOR ASSESSMENT:   Consult Enteral/tube feeding initiation and management  ASSESSMENT:    Pt remains on vent, CT chest this AM, electrolytes being supplemented  EN: tolerating Vital High Protein at 60 ml/hr, free water 200 mL q 4 hours     Digestive System: no signs of TF intolerance, rectal tube in place  Electrolyte and Renal Profile:  Recent Labs Lab 06/04/15 0514 06/05/15 0430 06/06/15 0606  BUN 25* 24* 25*  CREATININE 0.68 0.69 0.68  NA 150* 146* 144  K 3.5 3.6 3.3*  MG 1.8 1.4* 1.6*  PHOS 2.8 2.3* 2.3*   Glucose Profile:  Recent Labs  06/06/15 0409 06/06/15 0729 06/06/15 1115  GLUCAP 157* 115* 137*   Meds: D5 discontinued, ss novolog  Height:   Ht Readings from Last 1 Encounters:  05/26/2015 '5\' 7"'$  (1.702 m)    Weight:   Wt Readings from Last 1 Encounters:  06/06/15 242 lb 8.1 oz (110 kg)   Filed Weights   06/03/15 0500 06/04/15 0800 06/06/15 0427  Weight: 246 lb 4.1 oz (111.7 kg) 243 lb 13.3 oz (110.6 kg) 242 lb 8.1 oz (110 kg)    BMI:  Body mass index is 37.97 kg/(m^2).  Estimated Nutritional Needs:   Kcal:  1610-9604 kcals (11-14 kcals/kg) using current wt of 105.6 kg  Protein:  92-122 g (1.5-2.0 g/kg)   Fluid:  1525-1830 mL (25-30 ml/kg)   EDUCATION NEEDS:   No education needs identified at this time  [place], [place], LDN [phone] Pager

## 2015-06-06 NOTE — Progress Notes (Signed)
[facility] Progress Note Patient Name: CHASSIDY LAYSON DOB: June 04, 1946 MRN: [id number]   Date of Service  06/06/2015  HPI/Events of Note  No stress ulcer prophylaxis pepcid was d/c'd, but no reason given in notes  eICU Interventions  Re-order pepcid     Intervention Category Intermediate Interventions: Best-practice therapies (e.g. DVT, beta blocker, etc.)  Simonne Maffucci 06/06/2015, 3:28 PM

## 2015-06-07 LAB — BLOOD GAS, ARTERIAL
ACID-BASE EXCESS: 5.6 mmol/L — AB (ref 0.0–3.0)
ACID-BASE EXCESS: 7 mmol/L — AB (ref 0.0–3.0)
Allens test (pass/fail): POSITIVE — AB
BICARBONATE: 33.1 meq/L — AB (ref 21.0–28.0)
Bicarbonate: 33 mEq/L — ABNORMAL HIGH (ref 21.0–28.0)
DELIVERY SYSTEMS: POSITIVE
Delivery systems: POSITIVE
Expiratory PAP: 6
Expiratory PAP: 6
FIO2: 0.4
FIO2: 0.5
INSPIRATORY PAP: 14
Inspiratory PAP: 14
LHR: 8 {breaths}/min
MECHANICAL RATE: 8
O2 SAT: 88.7 %
O2 Saturation: 97.6 %
PATIENT TEMPERATURE: 37
PCO2 ART: 60 mmHg — AB (ref 32.0–48.0)
PH ART: 7.35 (ref 7.350–7.450)
PH ART: 7.37 (ref 7.350–7.450)
PO2 ART: 59 mmHg — AB (ref 83.0–108.0)
Patient temperature: 37
pCO2 arterial: 57 mmHg — ABNORMAL HIGH (ref 32.0–48.0)
pO2, Arterial: 101 mmHg (ref 83.0–108.0)

## 2015-06-07 LAB — BASIC METABOLIC PANEL
ANION GAP: 8 (ref 5–15)
BUN: 19 mg/dL (ref 6–20)
CALCIUM: 8 mg/dL — AB (ref 8.9–10.3)
CO2: 29 mmol/L (ref 22–32)
CREATININE: 0.73 mg/dL (ref 0.44–1.00)
Chloride: 108 mmol/L (ref 101–111)
GFR calc Af Amer: >60 mL/min (ref >60)
GLUCOSE: 98 mg/dL (ref 65–99)
Potassium: 3.2 mmol/L — ABNORMAL LOW (ref 3.5–5.1)
Sodium: 145 mmol/L (ref 135–145)

## 2015-06-07 LAB — CBC
HEMATOCRIT: 22.9 % — AB (ref 35.0–47.0)
Hemoglobin: 7.6 g/dL — ABNORMAL LOW (ref 12.0–16.0)
MCH: 29.7 pg (ref 26.0–34.0)
MCHC: 33.3 g/dL (ref 32.0–36.0)
MCV: 89.1 fL (ref 80.0–100.0)
PLATELETS: 22 10*3/uL — AB (ref 150–440)
RBC: 2.57 MIL/uL — ABNORMAL LOW (ref 3.80–5.20)
RDW: 17.4 % — AB (ref 11.5–14.5)
WBC: 0.8 10*3/uL — AB (ref 3.6–11.0)

## 2015-06-07 LAB — MAGNESIUM: Magnesium: 1.6 mg/dL — ABNORMAL LOW (ref 1.7–2.4)

## 2015-06-07 LAB — GLUCOSE, CAPILLARY
Glucose-Capillary: 102 mg/dL — ABNORMAL HIGH (ref 65–99)
Glucose-Capillary: 110 mg/dL — ABNORMAL HIGH (ref 65–99)
Glucose-Capillary: 145 mg/dL — ABNORMAL HIGH (ref 65–99)
Glucose-Capillary: 154 mg/dL — ABNORMAL HIGH (ref 65–99)
Glucose-Capillary: 55 mg/dL — ABNORMAL LOW (ref 65–99)
Glucose-Capillary: 96 mg/dL (ref 65–99)

## 2015-06-07 LAB — PHOSPHORUS: Phosphorus: 2.8 mg/dL (ref 2.5–4.6)

## 2015-06-07 MED ORDER — METOPROLOL TARTRATE 1 MG/ML IV SOLN
5.0000 mg | Freq: Four times a day (QID) | INTRAVENOUS | Status: DC
  Administered 2015-06-07 – 2015-06-12 (×13): 5 mg via INTRAVENOUS
  Filled 2015-06-07 (×12): qty 5

## 2015-06-07 MED ORDER — NITROGLYCERIN 2 % TD OINT
1.0000 [in_us] | TOPICAL_OINTMENT | Freq: Four times a day (QID) | TRANSDERMAL | Status: DC
  Administered 2015-06-07 – 2015-06-10 (×10): 1 [in_us] via TOPICAL
  Filled 2015-06-07 (×10): qty 1

## 2015-06-07 MED ORDER — MORPHINE SULFATE (PF) 2 MG/ML IV SOLN
2.0000 mg | Freq: Once | INTRAVENOUS | Status: AC
  Administered 2015-06-07: 2 mg via INTRAVENOUS
  Filled 2015-06-07: qty 1

## 2015-06-07 MED ORDER — POTASSIUM CHLORIDE 10 MEQ/100ML IV SOLN
10.0000 meq | INTRAVENOUS | Status: DC
  Administered 2015-06-07 (×4): 10 meq via INTRAVENOUS
  Filled 2015-06-07 (×4): qty 100

## 2015-06-07 MED ORDER — FUROSEMIDE 10 MG/ML IJ SOLN
40.0000 mg | Freq: Once | INTRAMUSCULAR | Status: AC
  Administered 2015-06-07: 40 mg via INTRAVENOUS
  Filled 2015-06-07: qty 4

## 2015-06-07 MED ORDER — METOPROLOL TARTRATE 1 MG/ML IV SOLN
2.5000 mg | INTRAVENOUS | Status: DC | PRN
  Administered 2015-06-08 – 2015-06-10 (×4): 2.5 mg via INTRAVENOUS
  Filled 2015-06-07 (×5): qty 5

## 2015-06-07 MED ORDER — LORAZEPAM 2 MG/ML IJ SOLN
0.5000 mg | INTRAMUSCULAR | Status: DC | PRN
  Administered 2015-06-08 (×3): 1 mg via INTRAVENOUS
  Filled 2015-06-07 (×4): qty 1

## 2015-06-07 MED ORDER — DEXMEDETOMIDINE HCL IN NACL 400 MCG/100ML IV SOLN
0.4000 ug/kg/h | INTRAVENOUS | Status: DC
  Administered 2015-06-07: 0.5 ug/kg/h via INTRAVENOUS
  Administered 2015-06-07: 0.8 ug/kg/h via INTRAVENOUS
  Administered 2015-06-08: 0.5 ug/kg/h via INTRAVENOUS
  Filled 2015-06-07: qty 100
  Filled 2015-06-07: qty 50
  Filled 2015-06-07: qty 100
  Filled 2015-06-07: qty 50

## 2015-06-07 MED ORDER — FENTANYL CITRATE (PF) 100 MCG/2ML IJ SOLN
12.5000 ug | INTRAMUSCULAR | Status: DC | PRN
  Administered 2015-06-07: 25 ug via INTRAVENOUS
  Administered 2015-06-08: 12.5 ug via INTRAVENOUS
  Filled 2015-06-07 (×2): qty 2

## 2015-06-07 MED ORDER — DEXTROSE 10 % IV SOLN
INTRAVENOUS | Status: DC
Start: 2015-06-07 — End: 2015-06-07
  Administered 2015-06-07: 01:00:00 via INTRAVENOUS

## 2015-06-07 MED ORDER — POTASSIUM CHLORIDE 2 MEQ/ML IV SOLN
INTRAVENOUS | Status: DC
  Administered 2015-06-07: 12:00:00 via INTRAVENOUS
  Filled 2015-06-07 (×3): qty 1000

## 2015-06-07 MED ORDER — HYDRALAZINE HCL 20 MG/ML IJ SOLN: 10.0000 mg | INTRAMUSCULAR | Status: DC | PRN

## 2015-06-07 MED ORDER — PHENOL 1.4 % MT LIQD
1.0000 | OROMUCOSAL | Status: DC | PRN
  Filled 2015-06-07: qty 177

## 2015-06-07 MED ORDER — FILGRASTIM 480 MCG/1.6ML IJ SOLN
480.0000 ug | Freq: Every day | INTRAMUSCULAR | Status: DC
  Administered 2015-06-07 – 2015-06-10 (×4): 480 ug via SUBCUTANEOUS
  Filled 2015-06-07 (×2): qty 2
  Filled 2015-06-07 (×2): qty 1.6

## 2015-06-07 MED ORDER — MAGNESIUM SULFATE 4 GM/100ML IV SOLN
4.0000 g | Freq: Once | INTRAVENOUS | Status: AC
  Administered 2015-06-07: 4 g via INTRAVENOUS
  Filled 2015-06-07: qty 100

## 2015-06-07 MED ORDER — DEXMEDETOMIDINE HCL IN NACL 400 MCG/100ML IV SOLN
0.0000 ug/kg/h | INTRAVENOUS | Status: DC
  Administered 2015-06-07: 0.4 ug/kg/h via INTRAVENOUS
  Filled 2015-06-07: qty 100

## 2015-06-07 NOTE — Progress Notes (Signed)
Placed on high fowlers position, cuff deflated, suctioned orally and endotracheally and then extubated to 6 lpm O2 [place]. Tolerating well

## 2015-06-07 NOTE — Progress Notes (Signed)
Received ST Eval order. Pt extubated this am. Currently on Bipap. Not appropriate for ST eval until 24 hrs post extubation. Assess Monday if off bipap.

## 2015-06-07 NOTE — Progress Notes (Signed)
Pharmacy Consult for Electrolyte Monitoring   Allergies  Allergen Reactions  . Atorvastatin Other (See Comments)    Does not remember why she had Intolerance to Lipitor.  . Fentanyl Itching  . Rosuvastatin Anxiety    Chest tigtness and feeling as if she had heartburn.    Patient Measurements: Height: '5\' 7"'$  (170.2 cm) Weight: 242 lb 8.1 oz (110 kg) IBW/kg (Calculated) : 61.6   Vital Signs: BP: 111/55 mmHg (11/12 0600) Pulse Rate: 82 (11/12 0600) Intake/Output from previous day: 11/11 0701 - 11/12 0700 In: -  Out: 2150 [Urine:2150] Intake/Output from this shift: Total I/O In: -  Out: 800 [Urine:800]  Labs:  Recent Labs  06/05/15 0735 06/06/15 0700 06/07/15 0540  WBC 0.5* 0.5* 0.8*  HGB 8.1* 7.4* 7.6*  HCT 25.2* 22.5* 22.9*  PLT 18* 16* 22*     Recent Labs  06/05/15 0430 06/06/15 0606 06/07/15 0540  NA 146* 144 145  K 3.6 3.3* 3.2*  CL 111 107 108  CO2 '31 28 29  '$ GLUCOSE 171* 149* 98  BUN 24* 25* 19  CREATININE 0.69 0.68 0.73  CALCIUM 7.8* 7.7* 8.0*  MG 1.4* 1.6* 1.6*  PHOS 2.3* 2.3* 2.8   Estimated Creatinine Clearance: 87.3 mL/min (by C-G formula based on Cr of 0.73).    Recent Labs  06/06/15 2057 06/07/15 0006 06/07/15 0432  GLUCAP 69 70* 96    Medical History: Past Medical History  Diagnosis Date  . Anxiety   . Arthritis   . COPD (chronic obstructive pulmonary disease) ([place])   . CHF (congestive heart failure) ([place])   . Hyperlipidemia   . Hypertension   . Diabetes mellitus without complication ([place])   . Chronic kidney disease   . Neuromuscular disorder (HCC)     Medications:  Scheduled:  . allopurinol  100 mg Oral Daily  . amLODipine  5 mg Oral Daily  . antiseptic oral rinse  7 mL Mouth Rinse QID  . budesonide (PULMICORT) nebulizer solution  0.5 mg Nebulization BID  . chlorhexidine gluconate  15 mL Mouth Rinse BID  . famotidine (PEPCID) IV  20 mg Intravenous Q12H  . filgrastim  300 mcg Subcutaneous Daily  . fluconazole  200  mg Oral Daily  . free water  200 mL Per Tube 6 times per day  . insulin aspart  0-15 Units Subcutaneous 6 times per day  . insulin detemir  15 Units Subcutaneous Q24H  . ipratropium-albuterol  3 mL Nebulization Q6H  . losartan  100 mg Oral Daily  . magnesium sulfate 1 - 4 g bolus IVPB  4 g Intravenous Once  . metoprolol  100 mg Per Tube BID  . potassium chloride  10 mEq Intravenous Q1 Hr x 4  . pregabalin  50 mg Per Tube TID   Infusions:  . dexmedetomidine 0.4 mcg/kg/hr (06/07/15 0145)  . dextrose 75 mL/hr at 06/07/15 0046  . feeding supplement (VITAL HIGH PROTEIN) 1,000 mL (06/04/15 1507)    Assessment: K 3.2, Phos 2.8, Mg 1.6, calcium 8, adjusted calcium 9.1  Plan:  Potassium and magnesium low this morning. Magnesium 4 gm IV x 1 and potassium 10 mEq IV Q1H x 4 doses, will recheck tomorrow with AM labs.   Andren Bethea A. [place], [place].D. Clinical Pharmacist  06/07/2015

## 2015-06-07 NOTE — Progress Notes (Signed)
[facility] Hematology/Oncology Progress Note  Date of admission: 05/23/2015  Hospital day:  06/07/2015  Chief Complaint: Emily Pratt is a 69 y.o. female who was admitted with acute on chronic respiratory failure in association with a large right lung mass.  Subjective: Patient unable to communicate (intubated).  She gestures that she is doing better.  Social History: The patient is alone today.  Allergies:  Allergies  Allergen Reactions  . Atorvastatin Other (See Comments)    Does not remember why she had Intolerance to Lipitor.  . Rosuvastatin Anxiety    Chest tigtness and feeling as if she had heartburn.    Scheduled Medications: . antiseptic oral rinse  7 mL Mouth Rinse QID  . budesonide (PULMICORT) nebulizer solution  0.5 mg Nebulization BID  . chlorhexidine gluconate  15 mL Mouth Rinse BID  . famotidine (PEPCID) IV  20 mg Intravenous Q12H  . filgrastim  480 mcg Subcutaneous Daily  . insulin aspart  0-15 Units Subcutaneous 6 times per day  . ipratropium-albuterol  3 mL Nebulization Q6H  . metoprolol  5 mg Intravenous 4 times per day    Review of Systems:  Unable to perform as patient intubated  Physical Exam: Blood pressure 153/72, pulse 84, temperature 98.8 F (37.1 C), temperature source Axillary, resp. rate 20, height 5' 7"  (1.702 m), weight 242 lb 8.1 oz (110 kg), SpO2 93 %.  GENERAL:  Well developed, well nourished, sitting comfortably in the ICU intubated in no acute distress. MENTAL STATUS:  Alert and interactive. HEAD:  Normocephalic, atraumatic, face symmetric, no Cushingoid features. EYES:   Pupils equal round and reactive to light and accomodation.  No conjunctivitis or scleral icterus. ENT:  Intubated.  RESPIRATORY:  Good air movement.  Upper airway sounds. Scattered soft crackles.  No wheezes. CARDIOVASCULAR:  Regular rate and rhythm without murmur, rub or gallop. ABDOMEN:  Soft, non-tender, with active bowel sounds, and no  appreciable hepatosplenomegaly.  No masses. SKIN:  No rashes, ulcers or lesions. EXTREMITIES: No edema, no skin discoloration or tenderness.  No palpable cords. LYMPH NODES: No palpable cervical, supraclavicular, axillary or inguinal adenopathy  NEUROLOGICAL: Unremarkable. PSYCH:  Appropriate.  Results for orders placed or performed during the hospital encounter of 05/17/2015 (from the past 48 hour(s))  Glucose, capillary     Status: Abnormal   Collection Time: 06/05/15  3:47 PM  Result Value Ref Range   Glucose-Capillary 192 (H) 65 - 99 mg/dL  Glucose, capillary     Status: Abnormal   Collection Time: 06/05/15  7:35 PM  Result Value Ref Range   Glucose-Capillary 156 (H) 65 - 99 mg/dL  Urinalysis complete, with microscopic ([facility] only)     Status: Abnormal   Collection Time: 06/05/15  8:55 PM  Result Value Ref Range   Color, Urine YELLOW (A) YELLOW   APPearance HAZY (A) CLEAR   Glucose, UA NEGATIVE NEGATIVE mg/dL   Bilirubin Urine NEGATIVE NEGATIVE   Ketones, ur NEGATIVE NEGATIVE mg/dL   Specific Gravity, Urine 1.011 1.005 - 1.030   Hgb urine dipstick 3+ (A) NEGATIVE   pH 5.0 5.0 - 8.0   Protein, ur 30 (A) NEGATIVE mg/dL   Nitrite NEGATIVE NEGATIVE   Leukocytes, UA NEGATIVE NEGATIVE   RBC / HPF TOO NUMEROUS TO COUNT 0 - 5 RBC/hpf   WBC, UA 6-30 0 - 5 WBC/hpf   Bacteria, UA RARE (A) NONE SEEN   Squamous Epithelial / LPF 0-5 (A) NONE SEEN   Mucous PRESENT  Budding Yeast PRESENT   Glucose, capillary     Status: Abnormal   Collection Time: 06/05/15 11:19 PM  Result Value Ref Range   Glucose-Capillary 142 (H) 65 - 99 mg/dL   Comment 1 Notify RN   Glucose, capillary     Status: Abnormal   Collection Time: 06/06/15  4:09 AM  Result Value Ref Range   Glucose-Capillary 157 (H) 65 - 99 mg/dL   Comment 1 Notify RN   Basic metabolic panel     Status: Abnormal   Collection Time: 06/06/15  6:06 AM  Result Value Ref Range   Sodium 144 135 - 145 mmol/L   Potassium 3.3 (L) 3.5 - 5.1  mmol/L   Chloride 107 101 - 111 mmol/L   CO2 28 22 - 32 mmol/L   Glucose, Bld 149 (H) 65 - 99 mg/dL   BUN 25 (H) 6 - 20 mg/dL   Creatinine, Ser 0.68 0.44 - 1.00 mg/dL   Calcium 7.7 (L) 8.9 - 10.3 mg/dL   GFR calc non Af Amer >60 >60 mL/min   GFR calc Af Amer >60 >60 mL/min    Comment: (NOTE) The eGFR has been calculated using the CKD EPI equation. This calculation has not been validated in all clinical situations. eGFR's persistently <60 mL/min signify possible Chronic Kidney Disease.    Anion gap 9 5 - 15  Magnesium     Status: Abnormal   Collection Time: 06/06/15  6:06 AM  Result Value Ref Range   Magnesium 1.6 (L) 1.7 - 2.4 mg/dL  Phosphorus     Status: Abnormal   Collection Time: 06/06/15  6:06 AM  Result Value Ref Range   Phosphorus 2.3 (L) 2.5 - 4.6 mg/dL  CBC     Status: Abnormal   Collection Time: 06/06/15  7:00 AM  Result Value Ref Range   WBC 0.5 (LL) 3.6 - 11.0 K/uL    Comment: CRITICAL VALUE NOTED.  VALUE IS CONSISTENT WITH PREVIOUSLY REPORTED AND CALLED VALUE.   RBC 2.53 (L) 3.80 - 5.20 MIL/uL   Hemoglobin 7.4 (L) 12.0 - 16.0 g/dL   HCT 22.5 (L) 35.0 - 47.0 %   MCV 89.0 80.0 - 100.0 fL   MCH 29.1 26.0 - 34.0 pg   MCHC 32.7 32.0 - 36.0 g/dL   RDW 17.4 (H) 11.5 - 14.5 %   Platelets 16 (LL) 150 - 440 K/uL    Comment: CRITICAL VALUE NOTED.  VALUE IS CONSISTENT WITH PREVIOUSLY REPORTED AND CALLED VALUE. PLATELET COUNT CONFIRMED BY SMEAR   Glucose, capillary     Status: Abnormal   Collection Time: 06/06/15  7:29 AM  Result Value Ref Range   Glucose-Capillary 115 (H) 65 - 99 mg/dL  Glucose, capillary     Status: Abnormal   Collection Time: 06/06/15 11:15 AM  Result Value Ref Range   Glucose-Capillary 137 (H) 65 - 99 mg/dL  Glucose, capillary     Status: Abnormal   Collection Time: 06/06/15  3:34 PM  Result Value Ref Range   Glucose-Capillary 146 (H) 65 - 99 mg/dL  Glucose, capillary     Status: None   Collection Time: 06/06/15  8:57 PM  Result Value Ref  Range   Glucose-Capillary 69 65 - 99 mg/dL  Glucose, capillary     Status: Abnormal   Collection Time: 06/07/15 12:06 AM  Result Value Ref Range   Glucose-Capillary 55 (L) 65 - 99 mg/dL  Glucose, capillary     Status: None   Collection Time:  06/07/15  4:32 AM  Result Value Ref Range   Glucose-Capillary 96 65 - 99 mg/dL  Basic metabolic panel     Status: Abnormal   Collection Time: 06/07/15  5:40 AM  Result Value Ref Range   Sodium 145 135 - 145 mmol/L   Potassium 3.2 (L) 3.5 - 5.1 mmol/L   Chloride 108 101 - 111 mmol/L   CO2 29 22 - 32 mmol/L   Glucose, Bld 98 65 - 99 mg/dL   BUN 19 6 - 20 mg/dL   Creatinine, Ser 0.73 0.44 - 1.00 mg/dL   Calcium 8.0 (L) 8.9 - 10.3 mg/dL   GFR calc non Af Amer >60 >60 mL/min   GFR calc Af Amer >60 >60 mL/min    Comment: (NOTE) The eGFR has been calculated using the CKD EPI equation. This calculation has not been validated in all clinical situations. eGFR's persistently <60 mL/min signify possible Chronic Kidney Disease.    Anion gap 8 5 - 15  Magnesium     Status: Abnormal   Collection Time: 06/07/15  5:40 AM  Result Value Ref Range   Magnesium 1.6 (L) 1.7 - 2.4 mg/dL  Phosphorus     Status: None   Collection Time: 06/07/15  5:40 AM  Result Value Ref Range   Phosphorus 2.8 2.5 - 4.6 mg/dL  CBC     Status: Abnormal   Collection Time: 06/07/15  5:40 AM  Result Value Ref Range   WBC 0.8 (LL) 3.6 - 11.0 K/uL    Comment: CRITICAL RESULT CALLED TO, READ BACK BY AND VERIFIED WITH: Bryn Gulling AT 1700 06/07/15.PMH    RBC 2.57 (L) 3.80 - 5.20 MIL/uL   Hemoglobin 7.6 (L) 12.0 - 16.0 g/dL   HCT 22.9 (L) 35.0 - 47.0 %   MCV 89.1 80.0 - 100.0 fL   MCH 29.7 26.0 - 34.0 pg   MCHC 33.3 32.0 - 36.0 g/dL   RDW 17.4 (H) 11.5 - 14.5 %   Platelets 22 (LL) 150 - 440 K/uL    Comment: CRITICAL RESULT CALLED TO, READ BACK BY AND VERIFIED WITH: Bryn Gulling AT 1749 06/07/15.PMH PLATELET COUNT CONFIRMED BY SMEAR   Glucose, capillary     Status:  Abnormal   Collection Time: 06/07/15  7:10 AM  Result Value Ref Range   Glucose-Capillary 110 (H) 65 - 99 mg/dL  Glucose, capillary     Status: Abnormal   Collection Time: 06/07/15 11:12 AM  Result Value Ref Range   Glucose-Capillary 145 (H) 65 - 99 mg/dL   Dg Abd 1 View  06/06/2015  CLINICAL DATA:  69 year old female with abdominal distention and diarrhea. Initial encounter. EXAM: ABDOMEN - 1 VIEW COMPARISON:  06/02/2015 and earlier. FINDINGS: 2 Portable AP supine views at 1525 hours. Large body habitus. Improved bowel gas pattern since 05/30/2015, although increased gas-filled small and large bowel loops since 06/02/2015. Still, no dilated small bowel loops present. Large bowel caliber is at the upper limits of normal. Enteric tube remains in place. The side hole appears to be at the level of the diaphragm. Stable visualized osseous structures. IMPRESSION: 1. Increased gas filled large and small bowel loops since 06/02/2015 most suggestive of ileus. 2. Enteric tube in place, but side hole at the level of the distal thoracic esophagus. Advance 6 cm to place the side hole within the stomach. Electronically Signed   By: Genevie Ann M.D.   On: 06/06/2015 15:41   Ct Chest W Contrast  06/06/2015  CLINICAL DATA:  Followup chest radiograph. History of CHF, COPD and hypertension. EXAM: CT CHEST WITH CONTRAST TECHNIQUE: Multidetector CT imaging of the chest was performed during intravenous contrast administration. CONTRAST:  61m OMNIPAQUE IOHEXOL 300 MG/ML  SOLN COMPARISON:  05/20/2015 FINDINGS: Mediastinum: The heart size is normal. No pericardial effusion. There is an endotracheal tube with tip above the carina. The nasogastric tube is present with tip in the body of the stomach. The index right supraclavicular lymph node measures 1.5 cm, image 5 of series 2. Previously 2.6 cm. Right paratracheal lymph node measure 2.0 cm, image 19 of series 2. Previously 3.3 cm. Pre-vascular lymph node measures 1.6 cm, image  25 of series 2. Previously 2.3 cm. Lungs/Pleura: There is a moderate size partially loculated right pleural effusion which has decreased in volume from previous exam. There has been interval improvement in aeration to the right upper lobe and superior segment of right lower lobe compatible with decrease in size of obstructing central right lung mass. Continued complete atelectasis and consolidation of the right middle lobe. The right lung mass is difficult to visualize and accurately remeasure. Mild atelectasis is noted within the posterior and medial left lower lobe. Upper Abdomen: Stable appearance of left adrenal nodule measuring 2.9 x 1.3 cm. The right adrenal gland is normal. Low to intermediate attenuation structure within the posterior right hepatic lobe measures 2.6 cm, image 57 of series 2. Unchanged from previous exam. There is mild perihepatic ascites noted. Musculoskeletal: Scoliosis deformity involves the thoracic spine. IMPRESSION: 1. Interval response to therapy. Since the previous exam there has been improved aeration to the right upper lobe and superior segment of the right lower lobe. Findings are compatible with decrease in size of central obstructing right perihilar lesion 2. Decrease in size of mediastinal and right supraclavicular adenopathy. 3. No new or progressive disease identified. Electronically Signed   By: TKerby MoorsM.D.   On: 06/06/2015 10:39   Dg Chest Port 1 View  06/06/2015  CLINICAL DATA:  Respiratory failure . EXAM: PORTABLE CHEST 1 VIEW COMPARISON:  06/02/2015.  CT 05/20/2015 . FINDINGS: Endotracheal tube 8 cm above the carina at the thoracic inlet. Slight interim advancement should be considered. NG tube and right PICC line in stable position. Progressive consolidation/ atelectasis of the right lung and progressive right pleural effusion. Right pleural effusion is large. Left lung is clear. Stable cardiomegaly. IMPRESSION: 1. Endotracheal tube 8 cm above the carina at  the thoracic inlet. Slightly mid aspect should be considered. NG tube and right PICC line stable position. 2. Progression of consolidation/atelectasis of the right lung with progression of the right pleural effusion. The right pleural effusion is large. 3. Stable cardiomegaly. Electronically Signed   By: TMarcello Moores Register   On: 06/06/2015 07:11    Assessment:  Emily VANHORNEis a 69y.o. 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  She has remained intubated.  Chest CT on 06/06/2015  reveals a response to therapy.  Per nursing, possible extubation attempt today.  Patient is pancytopenic on GCSF.  Platelet count slightly improved.  Plan: 1. Hematology/Oncology:  Continue to follow CBC with diff daily.  Increase GCSF to 480 mcg SQ q day.  No need for platelet transfusion. 2. Pulmonary:  Response to therapy documented on CT scan.  Possible extubation per nursing. 3. Infectious disease:  Afebrile on no antibiotics.  Neutropenic precautions.  If febrile, would perform a fever work-up and institute broad spectrum antibiotics.  Lequita Asal, MD  06/07/2015, 1:00 PM

## 2015-06-07 NOTE — Progress Notes (Signed)
Pharmacy Consult for Fluconazole  Indication: UTI  Allergies  Allergen Reactions  . Atorvastatin Other (See Comments)    Does not remember why she had Intolerance to Lipitor.  . Fentanyl Itching  . Rosuvastatin Anxiety    Chest tigtness and feeling as if she had heartburn.    Patient Measurements: Height: '5\' 7"'$  (170.2 cm) Weight: 242 lb 8.1 oz (110 kg) IBW/kg (Calculated) : 61.6  Vital Signs: BP: 111/55 mmHg (11/12 0600) Pulse Rate: 82 (11/12 0600) Intake/Output from previous day: 11/11 0701 - 11/12 0700 In: -  Out: 2150 [Urine:2150] Intake/Output from this shift:    Labs:  Recent Labs  06/05/15 0430 06/05/15 0735 06/06/15 0606 06/06/15 0700 06/07/15 0540  WBC  --  0.5*  --  0.5* 0.8*  HGB  --  8.1*  --  7.4* 7.6*  PLT  --  18*  --  16* 22*  CREATININE 0.69  --  0.68  --  0.73   Estimated Creatinine Clearance: 87.3 mL/min (by C-G formula based on Cr of 0.73). No results for input(s): VANCOTROUGH, VANCOPEAK, VANCORANDOM, GENTTROUGH, GENTPEAK, GENTRANDOM, TOBRATROUGH, TOBRAPEAK, TOBRARND, AMIKACINPEAK, AMIKACINTROU, AMIKACIN in the last 72 hours.   Microbiology: Recent Results (from the past 720 hour(s))  MRSA PCR Screening     Status: None   Collection Time: 05/21/2015 12:28 AM  Result Value Ref Range Status   MRSA by PCR NEGATIVE NEGATIVE Final    Comment:        The GeneXpert MRSA Assay (FDA approved for NASAL specimens only), is one component of a comprehensive MRSA colonization surveillance program. It is not intended to diagnose MRSA infection nor to guide or monitor treatment for MRSA infections.   Culture, blood (routine x 2)     Status: None   Collection Time: 05/13/2015  1:13 AM  Result Value Ref Range Status   Specimen Description BLOOD RIGHT HAND  Final   Special Requests BOTTLES DRAWN AEROBIC AND ANAEROBIC 3CC  Final   Culture NO GROWTH 5 DAYS  Final   Report Status 05/24/2015 FINAL  Final  Culture, blood (routine x 2)     Status: None   Collection Time: 05/16/2015  1:13 AM  Result Value Ref Range Status   Specimen Description BLOOD RIGHT ASSIST CONTROL  Final   Special Requests BOTTLES DRAWN AEROBIC AND ANAEROBIC 4CC  Final   Culture NO GROWTH 5 DAYS  Final   Report Status 05/24/2015 FINAL  Final  Culture, expectorated sputum-assessment     Status: None   Collection Time: 05/20/15  2:00 PM  Result Value Ref Range Status   Specimen Description EXPECTORATED SPUTUM  Final   Special Requests NONE  Final   Sputum evaluation THIS SPECIMEN IS ACCEPTABLE FOR SPUTUM CULTURE  Final   Report Status 05/20/2015 FINAL  Final  Culture, respiratory (NON-Expectorated)     Status: None   Collection Time: 05/20/15  2:00 PM  Result Value Ref Range Status   Specimen Description EXPECTORATED SPUTUM  Final   Special Requests NONE Reflexed from [id number]  Final   Gram Stain   Final    FEW WBC SEEN FEW GRAM POSITIVE RODS FAIR SPECIMEN - 70-80% WBCS    Culture APPEARS TO BE NORMAL FLORA  Final   Report Status 05/22/2015 FINAL  Final  Culture, bal-quantitative     Status: None   Collection Time: 05/20/15  5:05 PM  Result Value Ref Range Status   Specimen Description BRONCHIAL ALVEOLAR LAVAGE  Final   Special Requests Normal  Final   Gram Stain   Final    MODERATE WBC SEEN RARE GRAM POSITIVE RODS RARE GRAM POSITIVE COCCI IN CHAINS GOOD SPECIMEN - 80-90% WBCS    Culture APPEARS TO BE NORMAL FLORA  Final   Report Status 05/22/2015 FINAL  Final  Fungus Culture with Smear     Status: None (Preliminary result)   Collection Time: 05/20/15  5:05 PM  Result Value Ref Range Status   Specimen Description SPUTUM  Final   Special Requests Normal  Final   Culture CANDIDA DUBLINIENSIS  Final   Report Status PENDING  Incomplete  C difficile quick scan w PCR reflex     Status: None   Collection Time: 05/29/15  4:50 PM  Result Value Ref Range Status   C Diff antigen NEGATIVE NEGATIVE Final   C Diff toxin NEGATIVE NEGATIVE Final   C Diff  interpretation Negative for C. difficile  Final  Urine culture     Status: None   Collection Time: 05/29/15  6:19 PM  Result Value Ref Range Status   Specimen Description URINE, RANDOM  Final   Special Requests NONE  Final   Culture >=100,000 COLONIES/mL CANDIDA ALBICANS  Final   Report Status 06/04/2015 FINAL  Final    Medical History: Past Medical History  Diagnosis Date  . Anxiety   . Arthritis   . COPD (chronic obstructive pulmonary disease) ([place])   . CHF (congestive heart failure) ([place])   . Hyperlipidemia   . Hypertension   . Diabetes mellitus without complication ([place])   . Chronic kidney disease   . Neuromuscular disorder ([facility])     Medications:  Scheduled:  . allopurinol  100 mg Oral Daily  . amLODipine  5 mg Oral Daily  . antiseptic oral rinse  7 mL Mouth Rinse QID  . budesonide (PULMICORT) nebulizer solution  0.5 mg Nebulization BID  . chlorhexidine gluconate  15 mL Mouth Rinse BID  . famotidine (PEPCID) IV  20 mg Intravenous Q12H  . filgrastim  300 mcg Subcutaneous Daily  . fluconazole  200 mg Oral Daily  . free water  200 mL Per Tube 6 times per day  . insulin aspart  0-15 Units Subcutaneous 6 times per day  . insulin detemir  15 Units Subcutaneous Q24H  . ipratropium-albuterol  3 mL Nebulization Q6H  . losartan  100 mg Oral Daily  . magnesium sulfate 1 - 4 g bolus IVPB  4 g Intravenous Once  . metoprolol  100 mg Per Tube BID  . potassium chloride  10 mEq Intravenous Q1 Hr x 4  . pregabalin  50 mg Per Tube TID   Infusions:  . dexmedetomidine 0.4 mcg/kg/hr (06/07/15 0145)  . dextrose 75 mL/hr at 06/07/15 0046  . feeding supplement (VITAL HIGH PROTEIN) 1,000 mL (06/04/15 1507)   Assessment: 69 y/o Female, day 15 of hospital stay. Patient  with SCLC and currently intubated. Urine cultures from 11/3 growing yeast. Pharmacy consulted for dosing of fluconazole. Patient received '400mg'$  IV x 2 doses.     Plan:  Will continue patients dose to fluconazole '200mg'$   po daily. . Duration of treatment is 2 weeks.  Pharmacy will continue to monitor patients labs and renal function and make adjustments as needed.   [name], PharmD

## 2015-06-07 NOTE — Progress Notes (Signed)
[facility] [place] NAME: Emily Pratt    MR#:  [id number]  DATE OF BIRTH:  Jan 16, 1946  SUBJECTIVE:  CHIEF COMPLAINT: Pt is awake and alert, extubated today,  denies any pain, but in the abdominal area where anticoagulates are injected, now is on BiPAP REVIEW OF SYSTEMS:   Limited ROS.  No fever or chills. No chest pain, palpitation, SOB. No nausea, vomiting.  DRUG ALLERGIES:   Allergies  Allergen Reactions  . Atorvastatin Other (See Comments)    Does not remember why she had Intolerance to Lipitor.  . Rosuvastatin Anxiety    Chest tigtness and feeling as if she had heartburn.    VITALS:  Blood pressure 153/72, pulse 84, temperature 98.8 F (37.1 C), temperature source Axillary, resp. rate 20, height '5\' 7"'$  (1.702 m), weight 110 kg (242 lb 8.1 oz), SpO2 93 %.  PHYSICAL EXAMINATION:  GENERAL:  69 y.o.-year-old patient lying in the bed, awake, in mild-to-moderate respiratory distress on the BiPAP. Morbidly obese 7 EYES: Pupils equal, round, reactive to light and accommodation. No scleral icterus.  HEENT: Head atraumatic, normocephalic. ETT in place NECK:  Supple, no jugular venous distention. No thyroid enlargement, no tenderness.  LUNGS: Diminished bilateral air entrance, especially on the right,  diminished breath sounds on the right , no wheezing or rhonchi , good air movements on the left , on BiPAP in the mild to moderate respiratory distress CARDIOVASCULAR: S1, S2 normal. No murmurs, rubs, or gallops.  ABDOMEN: Soft, minimally tender diffusely, distended. Bowel sounds present. No organomegaly or mass.  EXTREMITIES: +1 pedal edema, no cyanosis, or clubbing.  NEUROLOGIC: CN 2-12 intact, strength 4/5,  Follows commands PSYCHIATRIC: awake and alert. SKIN: Sacral decubitus ulcer    LABORATORY PANEL:   CBC  Recent Labs Lab 06/07/15 0540  WBC 0.8*  HGB 7.6*  HCT 22.9*  PLT 22*    ------------------------------------------------------------------------------------------------------------------  Chemistries   Recent Labs Lab 06/07/15 0540  NA 145  K 3.2*  CL 108  CO2 29  GLUCOSE 98  BUN 19  CREATININE 0.73  CALCIUM 8.0*  MG 1.6*   ------------------------------------------------------------------------------------------------------------------  Cardiac Enzymes No results for input(s): TROPONINI in the last 168 hours. ------------------------------------------------------------------------------------------------------------------  RADIOLOGY:  Dg Abd 1 View  06/06/2015  CLINICAL DATA:  69 year old female with abdominal distention and diarrhea. Initial encounter. EXAM: ABDOMEN - 1 VIEW COMPARISON:  06/02/2015 and earlier. FINDINGS: 2 Portable AP supine views at 1525 hours. Large body habitus. Improved bowel gas pattern since 05/30/2015, although increased gas-filled small and large bowel loops since 06/02/2015. Still, no dilated small bowel loops present. Large bowel caliber is at the upper limits of normal. Enteric tube remains in place. The side hole appears to be at the level of the diaphragm. Stable visualized osseous structures. IMPRESSION: 1. Increased gas filled large and small bowel loops since 06/02/2015 most suggestive of ileus. 2. Enteric tube in place, but side hole at the level of the distal thoracic esophagus. Advance 6 cm to place the side hole within the stomach. Electronically Signed   By: Genevie Ann M.D.   On: 06/06/2015 15:41   Ct Chest W Contrast  06/06/2015  CLINICAL DATA:  Followup chest radiograph. History of CHF, COPD and hypertension. EXAM: CT CHEST WITH CONTRAST TECHNIQUE: Multidetector CT imaging of the chest was performed during intravenous contrast administration. CONTRAST:  53m OMNIPAQUE IOHEXOL 300 MG/ML  SOLN COMPARISON:  05/20/2015 FINDINGS: Mediastinum: The heart size is normal. No pericardial effusion. There  is an endotracheal  tube with tip above the carina. The nasogastric tube is present with tip in the body of the stomach. The index right supraclavicular lymph node measures 1.5 cm, image 5 of series 2. Previously 2.6 cm. Right paratracheal lymph node measure 2.0 cm, image 19 of series 2. Previously 3.3 cm. Pre-vascular lymph node measures 1.6 cm, image 25 of series 2. Previously 2.3 cm. Lungs/Pleura: There is a moderate size partially loculated right pleural effusion which has decreased in volume from previous exam. There has been interval improvement in aeration to the right upper lobe and superior segment of right lower lobe compatible with decrease in size of obstructing central right lung mass. Continued complete atelectasis and consolidation of the right middle lobe. The right lung mass is difficult to visualize and accurately remeasure. Mild atelectasis is noted within the posterior and medial left lower lobe. Upper Abdomen: Stable appearance of left adrenal nodule measuring 2.9 x 1.3 cm. The right adrenal gland is normal. Low to intermediate attenuation structure within the posterior right hepatic lobe measures 2.6 cm, image 57 of series 2. Unchanged from previous exam. There is mild perihepatic ascites noted. Musculoskeletal: Scoliosis deformity involves the thoracic spine. IMPRESSION: 1. Interval response to therapy. Since the previous exam there has been improved aeration to the right upper lobe and superior segment of the right lower lobe. Findings are compatible with decrease in size of central obstructing right perihilar lesion 2. Decrease in size of mediastinal and right supraclavicular adenopathy. 3. No new or progressive disease identified. Electronically Signed   By: Kerby Moors M.D.   On: 06/06/2015 10:39   Dg Chest Port 1 View  06/06/2015  CLINICAL DATA:  Respiratory failure . EXAM: PORTABLE CHEST 1 VIEW COMPARISON:  06/02/2015.  CT 05/20/2015 . FINDINGS: Endotracheal tube 8 cm above the carina at the thoracic  inlet. Slight interim advancement should be considered. NG tube and right PICC line in stable position. Progressive consolidation/ atelectasis of the right lung and progressive right pleural effusion. Right pleural effusion is large. Left lung is clear. Stable cardiomegaly. IMPRESSION: 1. Endotracheal tube 8 cm above the carina at the thoracic inlet. Slightly mid aspect should be considered. NG tube and right PICC line stable position. 2. Progression of consolidation/atelectasis of the right lung with progression of the right pleural effusion. The right pleural effusion is large. 3. Stable cardiomegaly. Electronically Signed   By: Marcello Moores  Register   On: 06/06/2015 07:11    EKG:   Orders placed or performed during the hospital encounter of 05/17/2015  . EKG 12-Lead  . EKG 12-Lead    ASSESSMENT AND PLAN:   1. Acute on chronic respiratory failure with hypoxia and Hypercapnia,  - Multifactorial due to large right-sided pleural effusion/ CHF/ COPD / mass- SCLC - Pulmonology and oncology is following, extubated today, now on BiPAP, struggling to breathe  - Continue nebulizer, steroids, antibiotics. Oncology gave chemotherapy October 31st 2016 with cisplatin and etoposide with hopes to shrink the mass, which showed response on CT scan, patient is extubated now  chest [place]: moderate right side pleural effusion, not seen on CT scan    2. Acute Congestive heart failure: Mixed systolic and diastolic  - Echo 92/42 shows ejection fraction 30 -35% with abnormal filling - Continue metoprolol per cardiology's recommendations , resumed losartan. Vital signs are relatively good. Hypertensive, likely due to stress  3. Acute on chronic kidney disease:  - Due to ATN, hypovolemia and hypotension, improved. - Nephrology signed off. Patient  was restarted on losartan.  Of IVF. Kidney function remains stable  4. Essential hypertension:  continue metoprolol and losartan, norvasc, better blood pressure control  5.  Diabetes mellitus type 2: controlled. Holding home  medications Off Levemir , now just on sliding scale, blood glucose is controlled. Now holding tube feed due to mild ileus. Patient was hypoglycemic yesterday  6.  Small cell lung cancer, likely stage IV per oncology - Status post bronchoscopy 10/25, pathology show small cell lung cancer - Long-term history of heavy smoking - pathology reports were discussed with the patient and sister in the past.   - Oncology initiated chemotherapy ,  PICC line for chemotherapy is placed 05/26/2015- Radiation oncology has been consulted and patient may benefit from radiology oncology intervention as well, since she is intubated. It might be difficult to perform. Following patient's platelet count closely with chemotherapy chest [place]: moderate right side pleural effusion.  CT chest: 1. Interval response to therapy. Since the previous exam there has been improved aeration to the right upper lobe and superior segment of the right lower lobe. Findings are compatible with decrease in size of central obstructing right perihilar lesion 2. Decrease in size of mediastinal and right supraclavicular adenopathy. 3. No new or progressive disease identified.   7. Postobstructive pneumonia versus atelectasis - Blood cultures negative to date, respiratory, BAL, AFB normal flora - Discontinued vancomycin and Zosyn, completed 7 day Levaquin course  8. Arrhythmia, SVT versus paroxysmal atrial fibrillation during bronchoscopy, now in sinus rhythm, continue metoprolol per cardiology's recommendations  9. Pancytopenia and thrombocytopenia, following closely, may need transfusions with chemotherapy for lung ca. Some improvement today. F/u CBC and  Oncologist. No platelet transfusion at this time per Dr. Stevenson Clinch. Continue neupogen. F/u CBC. Isolation.  10. Hypernatremia, improved  11. UTI  Ur cx showed yeast,  Discontinue Rocephin IV, continue Diflucan.  12. Ileus Improved.  Resumed TF. Now off tube feeds since she is extubated. Abdomen remains somewhat distended and painful to palpation. Following clinically. X-ray done yesterday revealed increased gas filled loops of the large and small bowel  Hypokalemia. KCl. F/u BMP. * Hypomagnesemia.  Mag iv. Follow up level.     Dr. Megan Salon will come back to see the patient next week.  CODE STATUS: Full code  TOTAL CRITICAL CARE TIME TAKING CARE OF THIS PATIENT: 40 minutes.    Theodoro Grist M.D on 06/07/2015 at 12:25 PM  Between 7am to 6pm - Pager - [phone] After 6pm go to [email or web address] - password [email or web address]  Office  [phone]  CC: Primary care physician; Dagoberto Ligas, MD

## 2015-06-07 NOTE — Progress Notes (Signed)
Nutrition Follow-up   INTERVENTION:   Coordination of Care: await diet progression as medically able. RD notes SLP evaluation ordered.  Medical Food Supplement Therapy: will recommend once diet order able to be safely advanced   NUTRITION DIAGNOSIS:   Inadequate oral intake related to acute illness as evidenced by NPO status.  GOAL:   Patient will meet greater than or equal to 90% of their needs  MONITOR:    (Energy Intake, Anthropometrics, Electrolyte/Renal Profile, Digestive System, Electrolyte/Renal Profile)  REASON FOR ASSESSMENT:   Consult Enteral/tube feeding initiation and management  ASSESSMENT:    Pt s/p extubation from the vent. Oncology following. MgS given this am.  Diet Order:  Diet NPO time specified    Current Nutrition: Pt was tolerating Vital high Protein at 59m/hr, with free water of 2056mq4hours   Gastrointestinal Profile: Last BM: 06/07/2015   Scheduled Medications:  . antiseptic oral rinse  7 mL Mouth Rinse QID  . budesonide (PULMICORT) nebulizer solution  0.5 mg Nebulization BID  . chlorhexidine gluconate  15 mL Mouth Rinse BID  . famotidine (PEPCID) IV  20 mg Intravenous Q12H  . filgrastim  480 mcg Subcutaneous Daily  . insulin aspart  0-15 Units Subcutaneous 6 times per day  . ipratropium-albuterol  3 mL Nebulization Q6H  . metoprolol  5 mg Intravenous 4 times per day    Continuous Medications:  . dextrose 5 % with kcl 50 mL/hr at 06/07/15 1149   D5 with KCl at 5076mr providing 204kcals in 24 hours   Electrolyte/Renal Profile and Glucose Profile:   Recent Labs Lab 06/05/15 0430 06/06/15 0606 06/07/15 0540  NA 146* 144 145  K 3.6 3.3* 3.2*  CL 111 107 108  CO2 '31 28 29  '$ BUN 24* 25* 19  CREATININE 0.69 0.68 0.73  CALCIUM 7.8* 7.7* 8.0*  MG 1.4* 1.6* 1.6*  PHOS 2.3* 2.3* 2.8  GLUCOSE 171* 149* 98   Protein Profile: No results for input(s): ALBUMIN in the last 168 hours.     Weight Trend since Admission: Filed  Weights   06/03/15 0500 06/04/15 0800 06/06/15 0427  Weight: 246 lb 4.1 oz (111.7 kg) 243 lb 13.3 oz (110.6 kg) 242 lb 8.1 oz (110 kg)      BMI:  Body mass index is 37.97 kg/(m^2).  Estimated Nutritional Needs:   Kcal:  using IBW of 61.4kg, BEE: 1181kcals, TEE: (IF 1.2-1.4)(AF 1.2) 1701-1985kcals  Protein:  68-80g protein (1.1-1.3g/kg)  Fluid:  1535-1842m17m fluid (25-30mL67m  EDUCATION NEEDS:   No education needs identified at this time   HIGH [place] LDN Pager [phone]

## 2015-06-08 ENCOUNTER — Inpatient Hospital Stay: Payer: Medicare Other

## 2015-06-08 LAB — COMPREHENSIVE METABOLIC PANEL
ALT: 45 U/L (ref 14–54)
ANION GAP: 8 (ref 5–15)
AST: 22 U/L (ref 15–41)
Albumin: 2.1 g/dL — ABNORMAL LOW (ref 3.5–5.0)
Alkaline Phosphatase: 113 U/L (ref 38–126)
BUN: 16 mg/dL (ref 6–20)
CHLORIDE: 108 mmol/L (ref 101–111)
CO2: 28 mmol/L (ref 22–32)
Calcium: 8.4 mg/dL — ABNORMAL LOW (ref 8.9–10.3)
Creatinine, Ser: 0.73 mg/dL (ref 0.44–1.00)
GFR calc non Af Amer: >60 mL/min (ref >60)
Glucose, Bld: 126 mg/dL — ABNORMAL HIGH (ref 65–99)
Potassium: 3.9 mmol/L (ref 3.5–5.1)
SODIUM: 144 mmol/L (ref 135–145)
Total Bilirubin: 1 mg/dL (ref 0.3–1.2)
Total Protein: 4.8 g/dL — ABNORMAL LOW (ref 6.5–8.1)

## 2015-06-08 LAB — BLOOD GAS, ARTERIAL
Acid-Base Excess: 4.8 mmol/L — ABNORMAL HIGH (ref 0.0–3.0)
BICARBONATE: 31.9 meq/L — AB (ref 21.0–28.0)
Delivery systems: POSITIVE
Expiratory PAP: 6
FIO2: 0.6
INSPIRATORY PAP: 14
Mechanical Rate: 8
O2 SAT: 91.7 %
PATIENT TEMPERATURE: 37
PCO2 ART: 62 mmHg — AB (ref 32.0–48.0)
PO2 ART: 68 mmHg — AB (ref 83.0–108.0)
pH, Arterial: 7.32 — ABNORMAL LOW (ref 7.350–7.450)

## 2015-06-08 LAB — CBC
HEMATOCRIT: 23.3 % — AB (ref 35.0–47.0)
HEMOGLOBIN: 7.5 g/dL — AB (ref 12.0–16.0)
MCH: 28.7 pg (ref 26.0–34.0)
MCHC: 32 g/dL (ref 32.0–36.0)
MCV: 89.5 fL (ref 80.0–100.0)
PLATELETS: 34 10*3/uL — AB (ref 150–440)
RBC: 2.61 MIL/uL — AB (ref 3.80–5.20)
RDW: 17.7 % — ABNORMAL HIGH (ref 11.5–14.5)
WBC: 1.8 10*3/uL — ABNORMAL LOW (ref 3.6–11.0)

## 2015-06-08 LAB — PHOSPHORUS: Phosphorus: 3.3 mg/dL (ref 2.5–4.6)

## 2015-06-08 LAB — GLUCOSE, CAPILLARY
Glucose-Capillary: 100 mg/dL — ABNORMAL HIGH (ref 65–99)
Glucose-Capillary: 110 mg/dL — ABNORMAL HIGH (ref 65–99)
Glucose-Capillary: 117 mg/dL — ABNORMAL HIGH (ref 65–99)
Glucose-Capillary: 124 mg/dL — ABNORMAL HIGH (ref 65–99)
Glucose-Capillary: 130 mg/dL — ABNORMAL HIGH (ref 65–99)
Glucose-Capillary: 134 mg/dL — ABNORMAL HIGH (ref 65–99)

## 2015-06-08 LAB — MAGNESIUM: MAGNESIUM: 1.7 mg/dL (ref 1.7–2.4)

## 2015-06-08 MED ORDER — DEXMEDETOMIDINE HCL IN NACL 400 MCG/100ML IV SOLN
INTRAVENOUS | Status: AC
  Administered 2015-06-08: 0.2 ug/kg/h via INTRAVENOUS
  Filled 2015-06-08: qty 100

## 2015-06-08 MED ORDER — DEXMEDETOMIDINE HCL IN NACL 400 MCG/100ML IV SOLN
0.2000 ug/kg/h | INTRAVENOUS | Status: AC
  Administered 2015-06-08: 0.2 ug/kg/h via INTRAVENOUS
  Administered 2015-06-09: 0.5 ug/kg/h via INTRAVENOUS
  Administered 2015-06-09: 1.3 ug/kg/h via INTRAVENOUS
  Administered 2015-06-09 (×2): 0.5 ug/kg/h via INTRAVENOUS
  Filled 2015-06-08 (×4): qty 100

## 2015-06-08 NOTE — Progress Notes (Signed)
HR 137 sustaining, SATs 96%

## 2015-06-08 NOTE — Progress Notes (Signed)
Patient previously taken off of bipap by Iona Beard, RT and placed on 60% HFNC. Patient tolerated x 2.5 hours, became tachycardic 120s, then progressed to HR 140-150s, desated to 46 %. Placed back on Bipap 80%, 14/6. 1 mg Ativan IV administered for anxiety. Continue to monitor.

## 2015-06-08 NOTE — Progress Notes (Signed)
[facility] [place] NAME: Emily Pratt    MR#:  [id number]  DATE OF BIRTH:  [birth year]  SUBJECTIVE:  CHIEF COMPLAINT: Pt is awake and alert, extubated 11. 12, placed on BIPAP, initiated on Precedex due to agitation, doing relatively well, now planning for HFNC,  denies any pain, SOB REVIEW OF SYSTEMS:   Limited ROS.  No fever or chills. No chest pain, palpitation, SOB. No nausea, vomiting.  DRUG ALLERGIES:   Allergies  Allergen Reactions  . Atorvastatin Other (See Comments)    Does not remember why she had Intolerance to Lipitor.  . Rosuvastatin Anxiety    Chest tigtness and feeling as if she had heartburn.    VITALS:  Blood pressure 114/58, pulse 73, temperature 97.4 F (36.3 C), temperature source Axillary, resp. rate 14, height '5\' 7"'$  (1.702 m), weight 114.6 kg (252 lb 10.4 oz), SpO2 94 %.  PHYSICAL EXAMINATION:  GENERAL:  69 y.o.-year-old patient lying in the bed, somnolent, minimal verbal communication, in dyspnea on the BiPAP.  EYES: Pupils equal, round, reactive to light and accommodation. No scleral icterus.  HEENT: Head atraumatic, normocephalic. ETT in place NECK:  Supple, no jugular venous distention. No thyroid enlargement, no tenderness.  LUNGS: Diminished bilateral air entrance, especially on the right,  diminished breath sounds on the right , no wheezing or rhonchi , good air movements on the left , on BiPAP in the mild to moderate respiratory distress CARDIOVASCULAR: S1, S2 normal. No murmurs, rubs, or gallops.  ABDOMEN: Soft, minimally tender diffusely, distended. Bowel sounds present. No organomegaly or mass.  EXTREMITIES: +1 pedal edema, no cyanosis, or clubbing.  NEUROLOGIC: CN 2-12 intact, strength 4/5,  Follows commands PSYCHIATRIC: awake and alert. SKIN: Sacral decubitus ulcer    LABORATORY PANEL:   CBC  Recent Labs Lab 06/08/15 0517  WBC 1.8*  HGB 7.5*  HCT 23.3*  PLT 34*    ------------------------------------------------------------------------------------------------------------------  Chemistries   Recent Labs Lab 06/08/15 0517  NA 144  K 3.9  CL 108  CO2 28  GLUCOSE 126*  BUN 16  CREATININE 0.73  CALCIUM 8.4*  MG 1.7  AST 22  ALT 45  ALKPHOS 113  BILITOT 1.0   ------------------------------------------------------------------------------------------------------------------  Cardiac Enzymes No results for input(s): TROPONINI in the last 168 hours. ------------------------------------------------------------------------------------------------------------------  RADIOLOGY:  Dg Abd 1 View  06/06/2015  CLINICAL DATA:  69 year old female with abdominal distention and diarrhea. Initial encounter. EXAM: ABDOMEN - 1 VIEW COMPARISON:  06/02/2015 and earlier. FINDINGS: 2 Portable AP supine views at 1525 hours. Large body habitus. Improved bowel gas pattern since 05/30/2015, although increased gas-filled small and large bowel loops since 06/02/2015. Still, no dilated small bowel loops present. Large bowel caliber is at the upper limits of normal. Enteric tube remains in place. The side hole appears to be at the level of the diaphragm. Stable visualized osseous structures. IMPRESSION: 1. Increased gas filled large and small bowel loops since 06/02/2015 most suggestive of ileus. 2. Enteric tube in place, but side hole at the level of the distal thoracic esophagus. Advance 6 cm to place the side hole within the stomach. Electronically Signed   By: Genevie Ann M.D.   On: 06/06/2015 15:41   Dg Chest Port 1 View  06/08/2015  CLINICAL DATA:  Respiratory failure.  Followup exam. EXAM: PORTABLE CHEST 1 VIEW COMPARISON:  Chest CT and chest radiographs, 06/06/2015. FINDINGS: There is near complete opacification of the right hemi thorax consistent with combination of  atelectasis/ consolidation and pleural fluid. There is, however, some more aeration of the mid to upper  lung on the right than there was on the prior chest radiographs. Left lung is essentially clear.  No pneumothorax. Right-sided PICC is stable. Endotracheal tube and nasogastric tube have been removed. IMPRESSION: 1. Mild improvement from prior study with some more aeration evident of the right mid and upper lung. This is consistent with a decrease in lung consolidation/atelectasis. There is still significant opacification throughout most of the right hemi thorax. No new abnormalities. Electronically Signed   By: Lajean Manes M.D.   On: 06/08/2015 08:14    EKG:   Orders placed or performed during the hospital encounter of 05/17/2015  . EKG 12-Lead  . EKG 12-Lead    ASSESSMENT AND PLAN:   1. Acute on chronic respiratory failure with hypoxia and Hypercapnia,  - Multifactorial due to large right-sided pleural effusion/ CHF/ COPD / mass- SCLC - Pulmonology and oncology is following, extubated , November 12 th, 2016, now on BiPAP, may change to high flow nasal cannula, , continue Precedex - Continue nebulizer, steroids, antibiotics. Oncology gave chemotherapy October 31st 2016 with cisplatin and etoposide with hopes to shrink the mass, which showed response on CT scan, patient is extubated now  chest [place]: moderate right side pleural effusion, not seen on recent CT scan. Patient remains relatively stable,  tenuous respiratory status, following clinically closely. Chest X-ray 06/08/2015 shows improvement in aeration.     2. Acute Congestive heart failure: Mixed systolic and diastolic  - Echo 51/70 shows ejection fraction 30 -35% with abnormal filling - Continue metoprolol per cardiology's recommendations , resumed losartan. Vital signs are good. Blood pressure is well controlled  3. Acute on chronic kidney disease:  - Due to ATN, hypovolemia and hypotension, improved. - Nephrology signed off. Patient was restarted on losartan.  Of IVF. Kidney function remains stable  4. Essential hypertension:   continue metoprolol and losartan, norvasc, good blood pressure control  5. Diabetes mellitus type 2: controlled. Holding home  medications Off Levemir , now just on sliding scale, blood glucose is controlled. Now holding tube feed due to mild ileus. Patient was hypoglycemic day before yesterday, blood glucose levels are ranging between 100-150 over the past 24 hours  6.  Small cell lung cancer, likely stage IV per oncology - Status post bronchoscopy 10/25, pathology show small cell lung cancer - Long-term history of heavy smoking - pathology reports were discussed with the patient and sister in the past.   - Oncology initiated chemotherapy ,  PICC line for chemotherapy is placed 05/26/2015- Radiation oncology has been consulted and patient may benefit from radiology oncology intervention as well, since she is intubated. It might be difficult to perform. Following patient's platelet count closely with chemotherapy chest [place]: moderate right side pleural effusion.  CT chest: 1. Interval response to therapy. Since the previous exam there has been improved aeration to the right upper lobe and superior segment of the right lower lobe. Findings are compatible with decrease in size of central obstructing right perihilar lesion 2. Decrease in size of mediastinal and right supraclavicular adenopathy. 3. No new or progressive disease identified.   7. Postobstructive pneumonia versus atelectasis - Blood cultures negative to date, respiratory, BAL, AFB normal flora - Discontinued vancomycin and Zosyn, completed 7 day Levaquin course  8. Arrhythmia, SVT versus paroxysmal atrial fibrillation during bronchoscopy, now in sinus rhythm, continue metoprolol per cardiology's recommendations, no arrhythmias noted, although patient remains somewhat tachycardic  9. Pancytopenia and thrombocytopenia, following closely, may need transfusions with chemotherapy for lung ca. Some improvement overall, appreciate   Oncologist input, he is receiving Neupogen daily, white blood cell count is improving. No platelet transfusion at this time per oncology. . F/u CBC. Neutropenic precautions and Isolation.  10. Hypernatremia, improved  11. UTI  Ur cx showed yeast,  Discontinue Rocephin IV, continue Diflucan.  12. Ileus Improved. Resumed TF. Now off tube feeds since she is extubated. Abdomen remains somewhat distended and painful to palpation. Following clinically. X-ray done yesterday revealed increased gas filled loops of the large and small bowel  Hypokalemia. KCl. F/u BMP. * Hypomagnesemia.  Mag iv. Follow up level.     Dr. Megan Salon will come back to see the patient next week.  CODE STATUS: Full code  TOTAL CRITICAL CARE TIME TAKING CARE OF THIS PATIENT: 40 minutes.    Theodoro Grist M.D on 06/08/2015 at 11:13 AM  Between 7am to 6pm - Pager - [phone] After 6pm go to [email or web address] - password [email or web address] [facility]  Office  [phone]  CC: Primary care physician; Dagoberto Ligas, MD

## 2015-06-08 NOTE — Progress Notes (Signed)
[place] Progress Note Patient Name: Emily Pratt DOB: 1945-10-28 MRN: [id number]   Date of Service  06/08/2015  HPI/Events of Note  worening agitation  eICU Interventions  precedex abg     Intervention Category Intermediate Interventions: Respiratory distress - evaluation and management  Raylene Miyamoto. 06/08/2015, 11:43 PM

## 2015-06-08 NOTE — Progress Notes (Signed)
Pharmacy Consult for Electrolyte Monitoring   Allergies  Allergen Reactions  . Atorvastatin Other (See Comments)    Does not remember why she had Intolerance to Lipitor.  . Rosuvastatin Anxiety    Chest tigtness and feeling as if she had heartburn.    Patient Measurements: Height: '5\' 7"'$  (170.2 cm) Weight: 252 lb 10.4 oz (114.6 kg) IBW/kg (Calculated) : 61.6   Vital Signs: Temp: 98.3 F (36.8 C) (11/13 0000) Temp Source: Axillary (11/13 0000) BP: 95/54 mmHg (11/13 0500) Pulse Rate: 73 (11/13 0500) Intake/Output from previous day: 11/12 0701 - 11/13 0700 In: 6303.2 [I.V.:2683.2; NG/GT:2920; IV Piggyback:700] Out: 2685 [Urine:2685] Intake/Output from this shift: Total I/O In: 774.5 [I.V.:724.5; IV Piggyback:50] Out: 485 [Urine:485]  Labs:  Recent Labs  06/06/15 0700 06/07/15 0540 06/08/15 0517  WBC 0.5* 0.8* 1.8*  HGB 7.4* 7.6* 7.5*  HCT 22.5* 22.9* 23.3*  PLT 16* 22* 34*     Recent Labs  06/06/15 0606 06/07/15 0540 06/08/15 0517  NA 144 145 144  K 3.3* 3.2* 3.9  CL 107 108 108  CO2 '28 29 28  '$ GLUCOSE 149* 98 126*  BUN 25* 19 16  CREATININE 0.68 0.73 0.73  CALCIUM 7.7* 8.0* 8.4*  MG 1.6* 1.6* 1.7  PHOS 2.3* 2.8 3.3  PROT  --   --  4.8*  ALBUMIN  --   --  2.1*  AST  --   --  22  ALT  --   --  45  ALKPHOS  --   --  113  BILITOT  --   --  1.0   Estimated Creatinine Clearance: 89.2 mL/min (by C-G formula based on Cr of 0.73).    Recent Labs  06/07/15 1950 06/08/15 0005 06/08/15 0417  GLUCAP 102* 100* 117*    Medical History: Past Medical History  Diagnosis Date  . Anxiety   . Arthritis   . COPD (chronic obstructive pulmonary disease) ([place])   . CHF (congestive heart failure) ([place])   . Hyperlipidemia   . Hypertension   . Diabetes mellitus without complication ([place])   . Chronic kidney disease   . Neuromuscular disorder ([facility])     Medications:  Scheduled:  . antiseptic oral rinse  7 mL Mouth Rinse QID  . budesonide (PULMICORT)  nebulizer solution  0.5 mg Nebulization BID  . chlorhexidine gluconate  15 mL Mouth Rinse BID  . famotidine (PEPCID) IV  20 mg Intravenous Q12H  . filgrastim  480 mcg Subcutaneous Daily  . insulin aspart  0-15 Units Subcutaneous 6 times per day  . ipratropium-albuterol  3 mL Nebulization Q6H  . metoprolol  5 mg Intravenous 4 times per day  . nitroGLYCERIN  1 inch Topical 4 times per day   Infusions:  . dexmedetomidine 0.5 mcg/kg/hr (06/08/15 0518)  . dextrose 5 % with kcl 50 mL/hr at 06/08/15 0400    Assessment: K 3.9, Phos 3.3, Mg 1.7, calcium 8.4, adjusted calcium 9.9  Plan:  Electrolytes within normal limits, will reassess tomorrow with AM labs.  Welcome Fults A. [place], [place].D. Clinical Pharmacist  06/08/2015

## 2015-06-08 NOTE — Progress Notes (Signed)
Extubated 11/12 to intermittent BiPAP <> high flow [place] O2.  Tolerating extubation marginally. Mostly requiring BiPAP  Denies pain. No new complaints  Filed Vitals:   06/08/15 1546 06/08/15 1600 06/08/15 1700 06/08/15 1800  BP:  147/80 158/71 159/83  Pulse:  111 120 121  Temp:      TempSrc:      Resp:  '25 24 26  '$ Height:      Weight:      SpO2: 100% 98% 96% 96%   Obese, cognition intact, RASS 0 HEENT WNL JVP cannot be visualized Very coarse BS R>L, no wheezes Reg, no M Abd obese, soft, +BS Trace symmetric LE edema No focal neuro deficits  BMP Latest Ref Rng 06/08/2015 06/07/2015 06/06/2015  Glucose 65 - 99 mg/dL 126(H) 98 149(H)  BUN 6 - 20 mg/dL 16 19 25(H)  Creatinine 0.44 - 1.00 mg/dL 0.73 0.73 0.68  Sodium 135 - 145 mmol/L 144 145 144  Potassium 3.5 - 5.1 mmol/L 3.9 3.2(L) 3.3(L)  Chloride 101 - 111 mmol/L 108 108 107  CO2 22 - 32 mmol/L '28 29 28  '$ Calcium 8.9 - 10.3 mg/dL 8.4(L) 8.0(L) 7.7(L)    CBC Latest Ref Rng 06/08/2015 06/07/2015 06/06/2015  WBC 3.6 - 11.0 K/uL 1.8(L) 0.8(LL) 0.5(LL)  Hemoglobin 12.0 - 16.0 g/dL 7.5(L) 7.6(L) 7.4(L)  Hematocrit 35.0 - 47.0 % 23.3(L) 22.9(L) 22.5(L)  Platelets 150 - 440 K/uL 34(L) 22(LL) 16(LL)    CXR: improved aeration R lung  IMPRESSION: Acute respiratory failure with hypoxemia New diagnosis of small cell ca - s/p chemo, XRT PAF > sinus Mild hypernatremia Pancytopenia after chemo - plts and WBC counts improving  PLAN/REC: Cont PRN BiPAP Cont High flow [place] O2 Cont empiric nebulized bronchodilators and steroids Cont scheduled and PRN metoprolol - change to PO when able Monitor BMET intermittently Monitor I/Os Correct electrolytes as indicated SUP: IV famotidine DVT px: SCDs Monitor CBC intermittently Transfuse per usual guidelines Begin trial of PO diet. SLP eval as indicated Try to mobilize OOB as tolerated Very high risk of re-intubation. Needs to remain in ICU  Merton Border, MD PCCM service Mobile  [phone] Pager [phone]

## 2015-06-08 NOTE — Progress Notes (Signed)
[facility] Hematology/Oncology Progress Note  Date of admission: 05/09/2015  Hospital day:  06/08/2015  Chief Complaint: Emily Pratt is a 69 y.o. female who was admitted with acute on chronic respiratory failure in association with a large right lung mass.  Subjective: Patient extubated yesterday.  Voice hoarse.  Wants to drink.  Denies any complaints.  Social History: The patient is alone today.  Allergies:  Allergies  Allergen Reactions  . Atorvastatin Other (See Comments)    Does not remember why she had Intolerance to Lipitor.  . Rosuvastatin Anxiety    Chest tigtness and feeling as if she had heartburn.    Scheduled Medications: . antiseptic oral rinse  7 mL Mouth Rinse QID  . budesonide (PULMICORT) nebulizer solution  0.5 mg Nebulization BID  . chlorhexidine gluconate  15 mL Mouth Rinse BID  . famotidine (PEPCID) IV  20 mg Intravenous Q12H  . filgrastim  480 mcg Subcutaneous Daily  . ipratropium-albuterol  3 mL Nebulization Q6H  . metoprolol  5 mg Intravenous 4 times per day  . nitroGLYCERIN  1 inch Topical 4 times per day    Review of Systems:   GENERAL:  Feels much better.  No fevers, sweats or weight loss. PERFORMANCE STATUS (ECOG):  3 HEENT:  No visual changes, runny nose, sore throat, mouth sores or tenderness. Lungs: Breathing comfortably.  No hemoptysis. Cardiac:  No chest pain, palpitations, orthopnea, or PND. GI:  No nausea, vomiting, diarrhea, constipation, melena or hematochezia. GU:  No urgency, frequency, dysuria, or hematuria. Musculoskeletal:  No back pain.  No joint pain.  No muscle tenderness. Extremities:  No pain or swelling. Skin:  No rashes or skin changes. Neuro:  No headache, numbness or weakness, balance or coordination issues. Endocrine:  No diabetes, thyroid issues, hot flashes or night sweats. Psych:  No mood changes, depression or anxiety. Pain:  No focal pain. Review of systems:  All other systems reviewed and  found to be negative.  Physical Exam: Blood pressure 114/58, pulse 73, temperature 97.4 F (36.3 C), temperature source Axillary, resp. rate 14, height 5' 7"  (1.702 m), weight 252 lb 10.4 oz (114.6 kg), SpO2 94 %.  GENERAL:  Well developed, well nourished, sitting comfortably in the ICU extubated in no acute distress. MENTAL STATUS:  Alert and interactive. HEAD:  Normocephalic, atraumatic, face symmetric, no Cushingoid features. EYES:   Pupils equal round and reactive to light and accomodation.  No conjunctivitis or scleral icterus. ENT:  Intubated.  RESPIRATORY:  Decreased respiratory excursion.  Soft crackles at bases on deep inspiration.  No wheezes. CARDIOVASCULAR:  Regular rate and rhythm without murmur, rub or gallop. ABDOMEN:  Soft, non-tender, with active bowel sounds, and no appreciable hepatosplenomegaly.  No masses. SKIN:  No rashes, ulcers or lesions. EXTREMITIES:  Intermittent compression devices in place.  1+ lower extremity edema.  No skin discoloration or tenderness.  No palpable cords. LYMPH NODES: No palpable cervical, supraclavicular, axillary or inguinal adenopathy  NEUROLOGICAL: Unremarkable. PSYCH:  Appropriate.  Results for orders placed or performed during the hospital encounter of 05/11/2015 (from the past 48 hour(s))  Glucose, capillary     Status: Abnormal   Collection Time: 06/06/15  3:34 PM  Result Value Ref Range   Glucose-Capillary 146 (H) 65 - 99 mg/dL  Glucose, capillary     Status: None   Collection Time: 06/06/15  8:57 PM  Result Value Ref Range   Glucose-Capillary 69 65 - 99 mg/dL  Glucose, capillary  Status: Abnormal   Collection Time: 06/07/15 12:06 AM  Result Value Ref Range   Glucose-Capillary 55 (L) 65 - 99 mg/dL  Glucose, capillary     Status: None   Collection Time: 06/07/15  4:32 AM  Result Value Ref Range   Glucose-Capillary 96 65 - 99 mg/dL  Basic metabolic panel     Status: Abnormal   Collection Time: 06/07/15  5:40 AM  Result  Value Ref Range   Sodium 145 135 - 145 mmol/L   Potassium 3.2 (L) 3.5 - 5.1 mmol/L   Chloride 108 101 - 111 mmol/L   CO2 29 22 - 32 mmol/L   Glucose, Bld 98 65 - 99 mg/dL   BUN 19 6 - 20 mg/dL   Creatinine, Ser 0.73 0.44 - 1.00 mg/dL   Calcium 8.0 (L) 8.9 - 10.3 mg/dL   GFR calc non Af Amer >60 >60 mL/min   GFR calc Af Amer >60 >60 mL/min    Comment: (NOTE) The eGFR has been calculated using the CKD EPI equation. This calculation has not been validated in all clinical situations. eGFR's persistently <60 mL/min signify possible Chronic Kidney Disease.    Anion gap 8 5 - 15  Magnesium     Status: Abnormal   Collection Time: 06/07/15  5:40 AM  Result Value Ref Range   Magnesium 1.6 (L) 1.7 - 2.4 mg/dL  Phosphorus     Status: None   Collection Time: 06/07/15  5:40 AM  Result Value Ref Range   Phosphorus 2.8 2.5 - 4.6 mg/dL  CBC     Status: Abnormal   Collection Time: 06/07/15  5:40 AM  Result Value Ref Range   WBC 0.8 (LL) 3.6 - 11.0 K/uL    Comment: CRITICAL RESULT CALLED TO, READ BACK BY AND VERIFIED WITH: Bryn Gulling AT 3570 06/07/15.PMH    RBC 2.57 (L) 3.80 - 5.20 MIL/uL   Hemoglobin 7.6 (L) 12.0 - 16.0 g/dL   HCT 22.9 (L) 35.0 - 47.0 %   MCV 89.1 80.0 - 100.0 fL   MCH 29.7 26.0 - 34.0 pg   MCHC 33.3 32.0 - 36.0 g/dL   RDW 17.4 (H) 11.5 - 14.5 %   Platelets 22 (LL) 150 - 440 K/uL    Comment: CRITICAL RESULT CALLED TO, READ BACK BY AND VERIFIED WITH: Bryn Gulling AT 1779 06/07/15.PMH PLATELET COUNT CONFIRMED BY SMEAR   Glucose, capillary     Status: Abnormal   Collection Time: 06/07/15  7:10 AM  Result Value Ref Range   Glucose-Capillary 110 (H) 65 - 99 mg/dL  Glucose, capillary     Status: Abnormal   Collection Time: 06/07/15 11:12 AM  Result Value Ref Range   Glucose-Capillary 145 (H) 65 - 99 mg/dL  Glucose, capillary     Status: Abnormal   Collection Time: 06/07/15  4:06 PM  Result Value Ref Range   Glucose-Capillary 154 (H) 65 - 99 mg/dL  Blood gas,  arterial     Status: Abnormal   Collection Time: 06/07/15  5:48 PM  Result Value Ref Range   FIO2 0.40    Delivery systems BILEVEL POSITIVE AIRWAY PRESSURE    LHR 8 resp/min   Inspiratory PAP 14    Expiratory PAP 6    pH, Arterial 7.35 7.350 - 7.450   pCO2 arterial 60 (H) 32.0 - 48.0 mmHg   pO2, Arterial 59 (L) 83.0 - 108.0 mmHg   Bicarbonate 33.1 (H) 21.0 - 28.0 mEq/L   Acid-Base Excess 5.6 (H)  0.0 - 3.0 mmol/L   O2 Saturation 88.7 %   Patient temperature 37.0    Collection site RIGHT RADIAL    Sample type ARTERIAL DRAW    Allens test (pass/fail) POSITIVE (A) PASS  Glucose, capillary     Status: Abnormal   Collection Time: 06/07/15  7:50 PM  Result Value Ref Range   Glucose-Capillary 102 (H) 65 - 99 mg/dL  Blood gas, arterial     Status: Abnormal   Collection Time: 06/07/15 10:45 PM  Result Value Ref Range   FIO2 0.50    Delivery systems BILEVEL POSITIVE AIRWAY PRESSURE    Inspiratory PAP 14    Expiratory PAP 6    pH, Arterial 7.37 7.350 - 7.450   pCO2 arterial 57 (H) 32.0 - 48.0 mmHg   pO2, Arterial 101 83.0 - 108.0 mmHg   Bicarbonate 33.0 (H) 21.0 - 28.0 mEq/L   Acid-Base Excess 7.0 (H) 0.0 - 3.0 mmol/L   O2 Saturation 97.6 %   Patient temperature 37.0    Collection site RIGHT RADIAL    Sample type ARTERIAL DRAW    Allens test (pass/fail) PASS PASS   Mechanical Rate 8   Glucose, capillary     Status: Abnormal   Collection Time: 06/08/15 12:05 AM  Result Value Ref Range   Glucose-Capillary 100 (H) 65 - 99 mg/dL  Glucose, capillary     Status: Abnormal   Collection Time: 06/08/15  4:17 AM  Result Value Ref Range   Glucose-Capillary 117 (H) 65 - 99 mg/dL  Magnesium     Status: None   Collection Time: 06/08/15  5:17 AM  Result Value Ref Range   Magnesium 1.7 1.7 - 2.4 mg/dL  Phosphorus     Status: None   Collection Time: 06/08/15  5:17 AM  Result Value Ref Range   Phosphorus 3.3 2.5 - 4.6 mg/dL  CBC     Status: Abnormal   Collection Time: 06/08/15  5:17 AM   Result Value Ref Range   WBC 1.8 (L) 3.6 - 11.0 K/uL   RBC 2.61 (L) 3.80 - 5.20 MIL/uL   Hemoglobin 7.5 (L) 12.0 - 16.0 g/dL   HCT 23.3 (L) 35.0 - 47.0 %   MCV 89.5 80.0 - 100.0 fL   MCH 28.7 26.0 - 34.0 pg   MCHC 32.0 32.0 - 36.0 g/dL   RDW 17.7 (H) 11.5 - 14.5 %   Platelets 34 (L) 150 - 440 K/uL  Comprehensive metabolic panel     Status: Abnormal   Collection Time: 06/08/15  5:17 AM  Result Value Ref Range   Sodium 144 135 - 145 mmol/L   Potassium 3.9 3.5 - 5.1 mmol/L   Chloride 108 101 - 111 mmol/L   CO2 28 22 - 32 mmol/L   Glucose, Bld 126 (H) 65 - 99 mg/dL   BUN 16 6 - 20 mg/dL   Creatinine, Ser 0.73 0.44 - 1.00 mg/dL   Calcium 8.4 (L) 8.9 - 10.3 mg/dL   Total Protein 4.8 (L) 6.5 - 8.1 g/dL   Albumin 2.1 (L) 3.5 - 5.0 g/dL   AST 22 15 - 41 U/L   ALT 45 14 - 54 U/L   Alkaline Phosphatase 113 38 - 126 U/L   Total Bilirubin 1.0 0.3 - 1.2 mg/dL   GFR calc non Af Amer >60 >60 mL/min   GFR calc Af Amer >60 >60 mL/min    Comment: (NOTE) The eGFR has been calculated using the CKD EPI equation. This  calculation has not been validated in all clinical situations. eGFR's persistently <60 mL/min signify possible Chronic Kidney Disease.    Anion gap 8 5 - 15  Glucose, capillary     Status: Abnormal   Collection Time: 06/08/15  7:02 AM  Result Value Ref Range   Glucose-Capillary 124 (H) 65 - 99 mg/dL  Glucose, capillary     Status: Abnormal   Collection Time: 06/08/15 11:29 AM  Result Value Ref Range   Glucose-Capillary 134 (H) 65 - 99 mg/dL   Dg Abd 1 View  06/06/2015  CLINICAL DATA:  69 year old female with abdominal distention and diarrhea. Initial encounter. EXAM: ABDOMEN - 1 VIEW COMPARISON:  06/02/2015 and earlier. FINDINGS: 2 Portable AP supine views at 1525 hours. Large body habitus. Improved bowel gas pattern since 05/30/2015, although increased gas-filled small and large bowel loops since 06/02/2015. Still, no dilated small bowel loops present. Large bowel caliber is  at the upper limits of normal. Enteric tube remains in place. The side hole appears to be at the level of the diaphragm. Stable visualized osseous structures. IMPRESSION: 1. Increased gas filled large and small bowel loops since 06/02/2015 most suggestive of ileus. 2. Enteric tube in place, but side hole at the level of the distal thoracic esophagus. Advance 6 cm to place the side hole within the stomach. Electronically Signed   By: Genevie Ann M.D.   On: 06/06/2015 15:41   Dg Chest Port 1 View  06/08/2015  CLINICAL DATA:  Respiratory failure.  Followup exam. EXAM: PORTABLE CHEST 1 VIEW COMPARISON:  Chest CT and chest radiographs, 06/06/2015. FINDINGS: There is near complete opacification of the right hemi thorax consistent with combination of atelectasis/ consolidation and pleural fluid. There is, however, some more aeration of the mid to upper lung on the right than there was on the prior chest radiographs. Left lung is essentially clear.  No pneumothorax. Right-sided PICC is stable. Endotracheal tube and nasogastric tube have been removed. IMPRESSION: 1. Mild improvement from prior study with some more aeration evident of the right mid and upper lung. This is consistent with a decrease in lung consolidation/atelectasis. There is still significant opacification throughout most of the right hemi thorax. No new abnormalities. Electronically Signed   By: Lajean Manes M.D.   On: 06/08/2015 08:14    Assessment:  Emily Pratt is a 69 y.o. 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  Chest CT on 06/06/2015  revealed a response to therapy.  She was extubated yesterday.  Patient is pancytopenic on GCSF.  WBC has improved on increased dose of GCSF.  Platelet count improved.  Plan: 1. Hematology/Oncology:  Continue to follow CBC with diff daily.  Continue GCSF to 480 mcg SQ q day until [place] is close to 5000, thus when discontinued will ensure patient's counts won't become  neutropenic again. Platelets adequate. 2. Pulmonary:  Response to therapy documented on CT scan.  Extubated yesterday.  Continue pulmonary toilet.  Patient scheduled for swallowing study. 3. Infectious disease:  Afebrile on no antibiotics.  Neutropenic precautions.  If febrile, would perform a fever work-up and institute broad spectrum antibiotics.    Lequita Asal, MD  06/08/2015, 12:25 PM

## 2015-06-08 NOTE — Progress Notes (Signed)
Heart rate 147-150, 2.5 IV Metoprolol IV administered. HR decreased to 110, continue to monitor.

## 2015-06-09 ENCOUNTER — Inpatient Hospital Stay: Payer: Medicare Other

## 2015-06-09 ENCOUNTER — Ambulatory Visit: Payer: Medicare Other | Admitting: Family Medicine

## 2015-06-09 DIAGNOSIS — L8990 Pressure ulcer of unspecified site, unspecified stage: Secondary | ICD-10-CM | POA: Insufficient documentation

## 2015-06-09 LAB — BASIC METABOLIC PANEL
ANION GAP: 5 (ref 5–15)
BUN: 13 mg/dL (ref 6–20)
CHLORIDE: 109 mmol/L (ref 101–111)
CO2: 33 mmol/L — AB (ref 22–32)
Calcium: 8.3 mg/dL — ABNORMAL LOW (ref 8.9–10.3)
Creatinine, Ser: 0.66 mg/dL (ref 0.44–1.00)
GFR calc non Af Amer: >60 mL/min (ref >60)
Glucose, Bld: 114 mg/dL — ABNORMAL HIGH (ref 65–99)
POTASSIUM: 4.2 mmol/L (ref 3.5–5.1)
SODIUM: 147 mmol/L — AB (ref 135–145)

## 2015-06-09 LAB — CBC
HCT: 22.3 % — ABNORMAL LOW (ref 35.0–47.0)
HEMOGLOBIN: 7.4 g/dL — AB (ref 12.0–16.0)
MCH: 29.8 pg (ref 26.0–34.0)
MCHC: 33.3 g/dL (ref 32.0–36.0)
MCV: 89.7 fL (ref 80.0–100.0)
Platelets: 53 10*3/uL — ABNORMAL LOW (ref 150–440)
RBC: 2.48 MIL/uL — AB (ref 3.80–5.20)
RDW: 17.6 % — ABNORMAL HIGH (ref 11.5–14.5)
WBC: 4.5 10*3/uL (ref 3.6–11.0)

## 2015-06-09 LAB — MAGNESIUM: MAGNESIUM: 1.5 mg/dL — AB (ref 1.7–2.4)

## 2015-06-09 LAB — PHOSPHORUS: PHOSPHORUS: 3.7 mg/dL (ref 2.5–4.6)

## 2015-06-09 LAB — GLUCOSE, CAPILLARY
Glucose-Capillary: 102 mg/dL — ABNORMAL HIGH (ref 65–99)
Glucose-Capillary: 105 mg/dL — ABNORMAL HIGH (ref 65–99)

## 2015-06-09 MED ORDER — MIDAZOLAM HCL 2 MG/2ML IJ SOLN
INTRAMUSCULAR | Status: AC
  Administered 2015-06-09: 4 mg via INTRAVENOUS
  Filled 2015-06-09: qty 4

## 2015-06-09 MED ORDER — VECURONIUM BROMIDE 10 MG IV SOLR
10.0000 mg | Freq: Once | INTRAVENOUS | Status: AC
  Administered 2015-06-09: 10 mg via INTRAVENOUS

## 2015-06-09 MED ORDER — MAGNESIUM SULFATE 50 % IJ SOLN
2.0000 g | Freq: Once | INTRAVENOUS | Status: AC
  Administered 2015-06-09: 2 g via INTRAVENOUS
  Filled 2015-06-09: qty 4

## 2015-06-09 MED ORDER — FENTANYL CITRATE (PF) 100 MCG/2ML IJ SOLN
INTRAMUSCULAR | Status: AC
  Administered 2015-06-09: 200 ug via INTRAVENOUS
  Filled 2015-06-09: qty 4

## 2015-06-09 MED ORDER — FREE WATER
200.0000 mL | Freq: Two times a day (BID) | Status: DC
  Administered 2015-06-09 – 2015-06-10 (×2): 200 mL

## 2015-06-09 MED ORDER — FENTANYL CITRATE (PF) 100 MCG/2ML IJ SOLN
200.0000 ug | Freq: Once | INTRAMUSCULAR | Status: AC
  Administered 2015-06-09: 200 ug via INTRAVENOUS

## 2015-06-09 MED ORDER — FUROSEMIDE 10 MG/ML IJ SOLN
20.0000 mg | Freq: Two times a day (BID) | INTRAMUSCULAR | Status: DC
  Administered 2015-06-09 – 2015-06-10 (×2): 20 mg via INTRAVENOUS
  Filled 2015-06-09 (×2): qty 2

## 2015-06-09 MED ORDER — ANTISEPTIC ORAL RINSE SOLUTION (CORINZ)
7.0000 mL | OROMUCOSAL | Status: DC
  Administered 2015-06-09 – 2015-06-24 (×141): 7 mL via OROMUCOSAL
  Filled 2015-06-09 (×157): qty 7

## 2015-06-09 MED ORDER — MIDAZOLAM HCL 2 MG/2ML IJ SOLN
4.0000 mg | Freq: Once | INTRAMUSCULAR | Status: AC
  Administered 2015-06-09: 4 mg via INTRAVENOUS

## 2015-06-09 MED ORDER — VECURONIUM BROMIDE 10 MG IV SOLR
INTRAVENOUS | Status: AC
  Administered 2015-06-09: 10 mg via INTRAVENOUS
  Filled 2015-06-09: qty 10

## 2015-06-09 MED ORDER — FENTANYL 2500MCG IN NS 250ML (10MCG/ML) PREMIX INFUSION
10.0000 ug/h | INTRAVENOUS | Status: DC
  Administered 2015-06-09: 25 ug/h via INTRAVENOUS
  Administered 2015-06-10: 10 ug/h via INTRAVENOUS
  Filled 2015-06-09 (×2): qty 250

## 2015-06-09 MED ORDER — VITAL HIGH PROTEIN PO LIQD
1000.0000 mL | ORAL | Status: DC
  Administered 2015-06-09: 1000 mL
  Administered 2015-06-09: 23:00:00

## 2015-06-09 MED ORDER — CHLORHEXIDINE GLUCONATE 0.12% ORAL RINSE (MEDLINE KIT)
15.0000 mL | Freq: Two times a day (BID) | OROMUCOSAL | Status: DC
  Administered 2015-06-09 – 2015-07-11 (×62): 15 mL via OROMUCOSAL
  Filled 2015-06-09 (×64): qty 15

## 2015-06-09 NOTE — Progress Notes (Signed)
[facility] [place] NAME: Emily Pratt    MR#:  [id number]  DATE OF BIRTH:  28-Nov-1945  SUBJECTIVE:  CHIEF COMPLAINT: Pt is awake and alert, extubated 11. 12, placed on BIPAP, was in significant discomfort , respiratory distress requiring initiation of Precedex due to agitation, intermittently on BiPAP and HFNC yesterday, very short of breath earlier today and was reintubated. Not able to review systems REVIEW OF SYSTEMS:   Limited ROS.  No fever or chills. No chest pain, palpitation, SOB. No nausea, vomiting.  DRUG ALLERGIES:   Allergies  Allergen Reactions  . Atorvastatin Other (See Comments)    Does not remember why she had Intolerance to Lipitor.  . Rosuvastatin Anxiety    Chest tigtness and feeling as if she had heartburn.    VITALS:  Blood pressure 163/79, pulse 95, temperature 97.7 F (36.5 C), temperature source Oral, resp. rate 7, height '5\' 7"'$  (1.702 m), weight 114.3 kg (251 lb 15.8 oz), SpO2 90 %.  PHYSICAL EXAMINATION:  GENERAL:  69 y.o.-year-old patient lying in the bed, somnolent, intubated , still dyspneic   EYES: Pupils equal, round, reactive to light and accommodation. No scleral icterus.  HEENT: Head atraumatic, normocephalic. ETT in place NECK:  Supple, no jugular venous distention. No thyroid enlargement, no tenderness.  LUNGS: Diminished bilateral air entrance, especially on the right,  diminished breath sounds on the right , rhonchi bilaterally CARDIOVASCULAR: S1, S2 normal. No murmurs, rubs, or gallops.  ABDOMEN: Soft, minimally tender diffusely, distended. Bowel sounds present. No organomegaly or mass.  EXTREMITIES: +1 pedal edema, no cyanosis, or clubbing.  NEUROLOGIC: CN 2-12 not able to evaluate, strength not able to evaluate,  somnolent, unable to follow commands PSYCHIATRIC: Somnolent, intubated, sedated  SKIN: Sacral decubitus ulcer    LABORATORY PANEL:   CBC  Recent Labs Lab 06/09/15 0521   WBC 4.5  HGB 7.4*  HCT 22.3*  PLT 53*   ------------------------------------------------------------------------------------------------------------------  Chemistries   Recent Labs Lab 06/08/15 0517 06/09/15 0521  NA 144 147*  K 3.9 4.2  CL 108 109  CO2 28 33*  GLUCOSE 126* 114*  BUN 16 13  CREATININE 0.73 0.66  CALCIUM 8.4* 8.3*  MG 1.7 1.5*  AST 22  --   ALT 45  --   ALKPHOS 113  --   BILITOT 1.0  --    ------------------------------------------------------------------------------------------------------------------  Cardiac Enzymes No results for input(s): TROPONINI in the last 168 hours. ------------------------------------------------------------------------------------------------------------------  RADIOLOGY:  Dg Chest Port 1 View  06/09/2015  CLINICAL DATA:  Intubation and OG tube placement EXAM: PORTABLE CHEST 1 VIEW COMPARISON:  1 day prior FINDINGS: Endotracheal tube terminates 4.1 cm above carina.Nasogastric tube extends beyond the inferior aspect of the film. A right-sided PICC line is poorly visualized centrally but likely terminates at the low SVC. The patient is minimally rotated left. Cardiomegaly accentuated by AP portable technique. Layering right pleural effusion. No pneumothorax. Interstitial edema is asymmetric and worse on the right. Lower lobe predominant right worse than left airspace disease is minimally improved. IMPRESSION: Minimal improvement in aeration since the exam of 1 day prior. Congestive heart failure with asymmetric interstitial and airspace opacities, worse on the right. Concurrent infection cannot be excluded. Layering right pleural effusion. Electronically Signed   By: Abigail Miyamoto M.D.   On: 06/09/2015 12:09   Dg Chest Port 1 View  06/08/2015  CLINICAL DATA:  Respiratory failure.  Followup exam. EXAM: PORTABLE CHEST 1 VIEW COMPARISON:  Chest  CT and chest radiographs, 06/06/2015. FINDINGS: There is near complete opacification of the  right hemi thorax consistent with combination of atelectasis/ consolidation and pleural fluid. There is, however, some more aeration of the mid to upper lung on the right than there was on the prior chest radiographs. Left lung is essentially clear.  No pneumothorax. Right-sided PICC is stable. Endotracheal tube and nasogastric tube have been removed. IMPRESSION: 1. Mild improvement from prior study with some more aeration evident of the right mid and upper lung. This is consistent with a decrease in lung consolidation/atelectasis. There is still significant opacification throughout most of the right hemi thorax. No new abnormalities. Electronically Signed   By: Lajean Manes M.D.   On: 06/08/2015 08:14   Dg Abd Portable 1v  06/09/2015  CLINICAL DATA:  OG tube placement EXAM: PORTABLE ABDOMEN - 1 VIEW COMPARISON:  06/06/2015 FINDINGS: The nasogastric tube tip is in the body of stomach. Side port is well below the GE junction. Mild gaseous distension of large and small bowel loops unchanged. IMPRESSION: 1. NG tube tip is in the body of stomach. Electronically Signed   By: Kerby Moors M.D.   On: 06/09/2015 12:04    EKG:   Orders placed or performed during the hospital encounter of 05/23/2015  . EKG 12-Lead  . EKG 12-Lead    ASSESSMENT AND PLAN:   1. Acute on chronic respiratory failure with hypoxia and Hypercapnia,  - Multifactorial due to large right-sided pleural effusion/ CHF/ COPD / mass- SCLC - Pulmonology and oncology is following, extubated  November 12 th, 2016, intermittently on BiPAP and high flow nasal cannulas, the past 24 hours requiring brief intubation , by  pulmonary intensive care . Discussed this patient's sister extensively. Emotional support provided - Continue nebulizer, steroids, antibiotics. Oncology gave chemotherapy October 31st 2016 with cisplatin and etoposide with some shrinking of the mass   Chest X-ray 06/09/2015 shows improvement in aeration, no significant change  since yesterday     2. Acute Congestive heart failure: Mixed systolic and diastolic  - Echo 75/10 shows ejection fraction 30 -35% with abnormal filling - Continue metoprolol per cardiology's recommendations , resumed losartan. Vital signs are good. Blood pressure is poorly controlled, initiate Lasix twice daily, following clinically as well as radiographically.   3. Acute on chronic kidney disease:  - Due to ATN, hypovolemia and hypotension, resolved. - Nephrology signed off. Patient was restarted on losartan, and now is going to be started on Lasix intravenously, follow tomorrow morning.  Of IVF. Kidney function remained stable up until now  4. Essential hypertension:  continue metoprolol and losartan, norvasc, poor blood pressure control, likely due to stress and recently reintubation, continue current therapy initiating Lasix  5. Diabetes mellitus type 2: controlled. Holding home  medications Off Levemir , now just on sliding scale, blood glucose is controlled. Now holding tube feed due to mild ileus. Patient was hypoglycemic recently, blood glucose levels are ranging between 100-150 over the past 24 hours  6.  Small cell lung cancer, likely stage IV per oncology - Status post bronchoscopy 10/25, pathology show small cell lung cancer - Long-term history of heavy smoking - pathology reports were discussed with the patient and sister in the past.   - Oncology initiated chemotherapy ,  PICC line for chemotherapy is placed 05/26/2015-chest [place]: moderate right side pleural effusion, which is not seen on chest x-ray or CT scan of the chest.  CT chest: Revealed Interval response to therapy,  improved aeration  to the right upper lobe,  decrease in size of central obstructing right perihilar lesion,  supraclavicular adenopathy.    7. Postobstructive pneumonia versus atelectasis - Blood cultures negative to date, respiratory, BAL, AFB normal flora - Discontinued vancomycin and Zosyn,  completed 7 day Levaquin course  8. Arrhythmia, SVT versus paroxysmal atrial fibrillation during bronchoscopy, now in sinus rhythm, continue metoprolol per cardiology's recommendations, no arrhythmias noted, although patient remains somewhat tachycardic  9. Pancytopenia and thrombocytopenia, following closely, may need transfusions with chemotherapy for lung ca. Improving overall, appreciate  Oncologist input, she is receiving Neupogen daily, white blood cell count is improving. No platelet transfusion at this time per oncology, as above 50,000 today. . F/u CBC. Neutropenic precautions and Isolation.  10. Hypernatremia, recheck  11. UTI, Ur cx showed yeast,  of Rocephin IV, continue Diflucan.  12. Ileus  Following clinically. X-ray done yesterday revealed increased gas filled loops of the large and small bowel, unchanged from before  13 Hypokalemia. KCl. Resolved 14.  Hypomagnesemia.  Mag iv. Follow up level tomorrow morning.       CODE STATUS: Full code  TOTAL CRITICAL CARE TIME TAKING CARE OF THIS PATIENT: 45 minutes.  Coordination of care time 15 minutes. Discussed this patient's family. Emotional support provided  Lavonya Hoerner M.D on 06/09/2015 at 1:43 PM  Between 7am to 6pm - Pager - [phone] After 6pm go to [email or web address] - password [email or web address] [facility]  Office  [phone]  CC: Primary care physician; Dagoberto Ligas, MD

## 2015-06-09 NOTE — Progress Notes (Signed)
Nutrition Follow-up   INTERVENTION:   EN: Discussed poc in rounds this am; per MD Kasa plan to reintubate pt on vent secondary to pulmonary status. MD agreeable to reinitiation of EN once pt on vent. Will recommend restarting Vital High Protein with a goal rate of 15m/hr to provide 1440kcals and 126g protein, 12161mof free water. Will restart TF at 2035mr and titrate to 25m61m and will recommend goal rate tomorrow pending pulmonary and gastrointestinal status. Will recommend additional 200mL69m for hydration and adjust as necessary.  Coordination of Care: recommend NPO diet order   NUTRITION DIAGNOSIS:   Inadequate oral intake related to acute illness as evidenced by NPO status.  GOAL:   Patient will meet greater than or equal to 90% of their needs  MONITOR:    (Energy Intake, Anthropometrics, Electrolyte/Renal Profile, Digestive System, Electrolyte/Renal Profile)  REASON FOR ASSESSMENT:   Consult Enteral/tube feeding initiation and management  ASSESSMENT:    Pt s/p extubation on Bi-pap now requiring re-intubation.    Current Nutrition: Pt NPO   Gastrointestinal Profile: Last BM: 11/14 watery loose stool (c.diff negative) rectal tube in place   Scheduled Medications:  . antiseptic oral rinse  7 mL Mouth Rinse QID  . antiseptic oral rinse  7 mL Mouth Rinse 10 times per day  . budesonide (PULMICORT) nebulizer solution  0.5 mg Nebulization BID  . chlorhexidine gluconate  15 mL Mouth Rinse BID  . chlorhexidine gluconate  15 mL Mouth Rinse BID  . famotidine (PEPCID) IV  20 mg Intravenous Q12H  . feeding supplement (VITAL HIGH PROTEIN)  1,000 mL Per Tube Q24H  . filgrastim  480 mcg Subcutaneous Daily  . free water  200 mL Per Tube BID  . furosemide  20 mg Intravenous BID  . ipratropium-albuterol  3 mL Nebulization Q6H  . metoprolol  5 mg Intravenous 4 times per day  . nitroGLYCERIN  1 inch Topical 4 times per day    Continuous Medications:  . dexmedetomidine 0.5  mcg/kg/hr (06/09/15 1328)  . fentaNYL infusion INTRAVENOUS 50 mcg/hr (06/09/15 1500)     Electrolyte/Renal Profile and Glucose Profile:   Recent Labs Lab 06/07/15 0540 06/08/15 0517 06/09/15 0521  NA 145 144 147*  K 3.2* 3.9 4.2  CL 108 108 109  CO2 29 28 33*  BUN '19 16 13  '$ CREATININE 0.73 0.73 0.66  CALCIUM 8.0* 8.4* 8.3*  MG 1.6* 1.7 1.5*  PHOS 2.8 3.3 3.7  GLUCOSE 98 126* 114*   Protein Profile:   Recent Labs Lab 06/08/15 0517  ALBUMIN 2.1*      Weight Trend since Admission: Filed Weights   06/07/15 1400 06/08/15 0515 06/09/15 0426  Weight: 249 lb 1.9 oz (113 kg) 252 lb 10.4 oz (114.6 kg) 251 lb 15.8 oz (114.3 kg)     Diet Order:  Diet heart healthy/carb modified Room service appropriate?: Yes; Fluid consistency:: Thin    BMI:  Body mass index is 39.46 kg/(m^2).  Estimated Nutritional Needs:   Kcal:  1254-1596kcals, (11-14kcals/kg) using current weight of 114kg)  Protein:  92-122g protein (1.5-2.0g/kg)  Fluid:  1535-1842mL 20mluid (25-30mL/k47mEDUCATION NEEDS:   No education needs identified at this time   HIGH CaMendonDN Pager [phone]

## 2015-06-09 NOTE — Progress Notes (Signed)
[place] Progress Note Patient Name: Emily Pratt DOB: August 09, 1945 MRN: [id number]   Date of Service  06/09/2015  HPI/Events of Note  Some response to precedex, remains aggitation, BP , HR WNL  eICU Interventions  Max precedex now to 1.5     Intervention Category Major Interventions: Delirium, psychosis, severe agitation - evaluation and management  FEINSTEIN,DANIEL J. 06/09/2015, 1:17 AM

## 2015-06-09 NOTE — Progress Notes (Signed)
Reinstated ventilator order, found patient on vent upon entry with no ventilator orders

## 2015-06-09 NOTE — Progress Notes (Signed)
Extubated 11/12 to intermittent BiPAP <> high flow [place] O2.  Will likely need to re-intubated On biPAP, increased WOB,sob  Filed Vitals:   06/09/15 0426 06/09/15 0500 06/09/15 0600 06/09/15 0800  BP:  112/64 121/63 121/68  Pulse:  73 78 70  Temp:    96.2 F (35.7 C)  TempSrc:    Axillary  Resp:  '18 19 16  '$ Height:      Weight: 251 lb 15.8 oz (114.3 kg)     SpO2:  98% 98% 98%   Obese, cognition intact, RASS 0 HEENT WNL JVP cannot be visualized Very coarse BS R>L, no wheezes Reg, no M Abd obese, soft, +BS Trace symmetric LE edema No focal neuro deficits  BMP Latest Ref Rng 06/09/2015 06/08/2015 06/07/2015  Glucose 65 - 99 mg/dL 114(H) 126(H) 98  BUN 6 - 20 mg/dL '13 16 19  '$ Creatinine 0.44 - 1.00 mg/dL 0.66 0.73 0.73  Sodium 135 - 145 mmol/L 147(H) 144 145  Potassium 3.5 - 5.1 mmol/L 4.2 3.9 3.2(L)  Chloride 101 - 111 mmol/L 109 108 108  CO2 22 - 32 mmol/L 33(H) 28 29  Calcium 8.9 - 10.3 mg/dL 8.3(L) 8.4(L) 8.0(L)    CBC Latest Ref Rng 06/09/2015 06/08/2015 06/07/2015  WBC 3.6 - 11.0 K/uL 4.5 1.8(L) 0.8(LL)  Hemoglobin 12.0 - 16.0 g/dL 7.4(L) 7.5(L) 7.6(L)  Hematocrit 35.0 - 47.0 % 22.3(L) 23.3(L) 22.9(L)  Platelets 150 - 440 K/uL 53(L) 34(L) 22(LL)     IMPRESSION: Acute respiratory failure with hypoxemia from acute COPD exacerbation with acute Tx for Small cell lung cancer New diagnosis of small cell ca - s/p chemo, XRT PAF > sinus Mild hypernatremia Pancytopenia after chemo - plts and WBC counts improving  PLAN/REC: Cont PRN BiPAP-will likley need re-intubation and Lurline Idol will give patient 24 hrs to assess for interval imrpovement otherwise will need vent suport Cont High flow [place] O2/biPAP as needed Cont empiric nebulized bronchodilators and steroids Cont scheduled and PRN metoprolol  Monitor BMET intermittently Monitor I/Os Correct electrolytes as indicated SUP: IV famotidine DVT px: SCDs Monitor CBC intermittently Transfuse per usual guidelines Begin trial of  PO diet. SLP eval as indicated Try to mobilize OOB as tolerated Very high risk of re-intubation. Needs to remain in ICU  I have personally obtained a history, examined the patient, evaluated Pertinent laboratory and RadioGraphic/imaging results, and  formulated the assessment and plan  The Patient requires high complexity decision making for assessment and support, frequent evaluation and titration of therapies, application of advanced monitoring technologies and extensive interpretation of multiple databases. Critical Care Time devoted to patient care services described in this note is 40 minutes.   Overall, patient is critically ill, prognosis is guarded.  Patient with Multiorgan failure and at high risk for cardiac arrest and death.    Corrin Parker, M.D.  [facility]  Medical Director [place] Director [facility] Cardio-Pulmonary Department

## 2015-06-09 NOTE — Progress Notes (Signed)
Speech Therapy Note: received order, reviewed chart notes; consulted NSG re: pt's status today. Pt currently remains on BiPAP and MD is concerned pt will likely need to be reintubated d/t declined respiratory status. ST will f/u next 1-3 days w/ pt's status and appropriateness for BSE. NSG agreed.

## 2015-06-09 NOTE — Progress Notes (Signed)
Pharmacy Consult for Electrolyte Monitoring   Allergies  Allergen Reactions  . Atorvastatin Other (See Comments)    Does not remember why she had Intolerance to Lipitor.  . Rosuvastatin Anxiety    Chest tigtness and feeling as if she had heartburn.    Patient Measurements: Height: '5\' 7"'$  (170.2 cm) Weight: 251 lb 15.8 oz (114.3 kg) IBW/kg (Calculated) : 61.6   Vital Signs: Temp: 97.8 F (36.6 C) (11/14 0200) Temp Source: Axillary (11/14 0200) BP: 121/63 mmHg (11/14 0600) Pulse Rate: 78 (11/14 0600) Intake/Output from previous day: 11/13 0701 - 11/14 0700 In: 425.7 [I.V.:375.7; IV Piggyback:50] Out: 0102 [Urine:1775; Stool:100] Intake/Output from this shift:    Labs:  Recent Labs  06/07/15 0540 06/08/15 0517 06/09/15 0521  WBC 0.8* 1.8* 4.5  HGB 7.6* 7.5* 7.4*  HCT 22.9* 23.3* 22.3*  PLT 22* 34* 53*     Recent Labs  06/07/15 0540 06/08/15 0517 06/09/15 0521  NA 145 144 147*  K 3.2* 3.9 4.2  CL 108 108 109  CO2 29 28 33*  GLUCOSE 98 126* 114*  BUN '19 16 13  '$ CREATININE 0.73 0.73 0.66  CALCIUM 8.0* 8.4* 8.3*  MG 1.6* 1.7 1.5*  PHOS 2.8 3.3 3.7  PROT  --  4.8*  --   ALBUMIN  --  2.1*  --   AST  --  22  --   ALT  --  45  --   ALKPHOS  --  113  --   BILITOT  --  1.0  --    Estimated Creatinine Clearance: 89.1 mL/min (by C-G formula based on Cr of 0.66).    Recent Labs  06/08/15 1946 06/09/15 0005 06/09/15 0414  GLUCAP 110* 105* 102*    Medical History: Past Medical History  Diagnosis Date  . Anxiety   . Arthritis   . COPD (chronic obstructive pulmonary disease) ([place])   . CHF (congestive heart failure) ([place])   . Hyperlipidemia   . Hypertension   . Diabetes mellitus without complication ([place])   . Chronic kidney disease   . Neuromuscular disorder (H[facility])     Medications:  Scheduled:  . antiseptic oral rinse  7 mL Mouth Rinse QID  . budesonide (PULMICORT) nebulizer solution  0.5 mg Nebulization BID  . chlorhexidine gluconate  15 mL  Mouth Rinse BID  . famotidine (PEPCID) IV  20 mg Intravenous Q12H  . filgrastim  480 mcg Subcutaneous Daily  . ipratropium-albuterol  3 mL Nebulization Q6H  . magnesium sulfate LVP 250-500 ml  2 g Intravenous Once  . metoprolol  5 mg Intravenous 4 times per day  . nitroGLYCERIN  1 inch Topical 4 times per day   Infusions:  . dexmedetomidine 0.5 mcg/kg/hr (06/09/15 7253)    Assessment: Magnesium is low, but replaced already with 2 g magnesium sulfate this AM.   Plan:  Will f/u AM labs.   Ulice Dash, PharmD Clinical Pharmacist  06/09/2015

## 2015-06-09 NOTE — Progress Notes (Signed)
[facility] Progress Note Patient Name: Emily Pratt DOB: [birth year] MRN: [id number]   Date of Service  06/09/2015  HPI/Events of Note  Mag low supp  eICU Interventions       Intervention Category Intermediate Interventions: Electrolyte abnormality - evaluation and management  Raylene Miyamoto. 06/09/2015, 6:11 AM

## 2015-06-09 NOTE — Progress Notes (Signed)
   06/09/15 1300  Clinical Encounter Type  Visited With Patient  Visit Type Follow-up  Consult/Referral To Chaplain  Spiritual Encounters  Spiritual Needs Other (Comment)  Chaplain rounded in unit and checked on the patient. Offered a compassionate presence. Chaplain Loie Jahr A. Kelsen [place] Ext. [phone]

## 2015-06-09 NOTE — Procedures (Signed)
Endotracheal Intubation: Patient required placement of an artificial airway secondary to resp failure.   Consent: Emergent.   Hand washing performed prior to starting the procedure.   Medications administered for sedation prior to procedure: Midazolam 4 mg IV,  Vecuronium 10 mg IV, Fentanyl 100 mcg IV.   Procedure: A time out procedure was called and correct patient, name, & ID confirmed. Needed supplies and equipment were assembled and checked to include ETT, 10 ml syringe, Glidescope, Mac and Miller blades, suction, oxygen and bag mask valve, end tidal CO2 monitor. Patient was positioned to align the mouth and pharynx to facilitate visualization of the glottis.  Heart rate, SpO2 and blood pressure was continuously monitored during the procedure. Pre-oxygenation was conducted prior to intubation and endotracheal tube was placed through the vocal cords into the trachea.  During intubation an assistant applied gentle pressure to the cricoid cartilage.   The artificial airway was placed under direct visualization via glidescope route using a 8.0 ETT on the first attempt.    ETT was secured at 23 cm mark.    Placement was confirmed by auscuitation of lungs with good breath sounds bilaterally and no stomach sounds.  Condensation was noted on endotracheal tube.  Pulse ox %.  CO2 detector in place with appropriate color change.   Complications: None .   Operator: Miken Stecher.   Chest radiograph ordered and pending.   Comments: OGT placed via glidescope.  Abraham Entwistle David Courtenay Creger, M.D.  [facility]  Medical Director ICU-[facility] [place] Medical Director [facility] Cardio-Pulmonary Department       

## 2015-06-09 NOTE — Progress Notes (Signed)
PT Cancellation Note  Patient Details Name: Emily Pratt MRN: [id number] DOB: 1945/11/27   Cancelled Treatment:    Reason Eval/Treat Not Completed: Medical issues which prohibited therapy Chart reviewed and RN consulted. Pt placed back on BiPAP and per RN MD concerned that she will need to be re-intubated. Given current compromised respiratory status and concerns for reintubation will hold patient on this date. Will attempt PT treatment on next appropriate date when patient stabilizes.   Lyndel Safe Arhianna Ebey PT, DPT   Sherwood Castilla 06/09/2015, 10:43 AM

## 2015-06-10 ENCOUNTER — Other Ambulatory Visit: Payer: Self-pay | Admitting: Family Medicine

## 2015-06-10 LAB — CBC
HCT: 23.9 % — ABNORMAL LOW (ref 35.0–47.0)
Hemoglobin: 7.8 g/dL — ABNORMAL LOW (ref 12.0–16.0)
MCH: 29.4 pg (ref 26.0–34.0)
MCHC: 32.6 g/dL (ref 32.0–36.0)
MCV: 90.3 fL (ref 80.0–100.0)
PLATELETS: 77 10*3/uL — AB (ref 150–440)
RBC: 2.64 MIL/uL — ABNORMAL LOW (ref 3.80–5.20)
RDW: 17.3 % — AB (ref 11.5–14.5)
WBC: 9.6 10*3/uL (ref 3.6–11.0)

## 2015-06-10 LAB — FUNGUS CULTURE W SMEAR: SPECIAL REQUESTS: NORMAL

## 2015-06-10 LAB — GLUCOSE, CAPILLARY
Glucose-Capillary: 147 mg/dL — ABNORMAL HIGH (ref 65–99)
Glucose-Capillary: 140 mg/dL — ABNORMAL HIGH (ref 65–99)
Glucose-Capillary: 138 mg/dL — ABNORMAL HIGH (ref 65–99)

## 2015-06-10 LAB — MAGNESIUM: MAGNESIUM: 1.6 mg/dL — AB (ref 1.7–2.4)

## 2015-06-10 LAB — BASIC METABOLIC PANEL
Anion gap: 7 (ref 5–15)
BUN: 11 mg/dL (ref 6–20)
CALCIUM: 8.4 mg/dL — AB (ref 8.9–10.3)
CHLORIDE: 108 mmol/L (ref 101–111)
CO2: 33 mmol/L — ABNORMAL HIGH (ref 22–32)
CREATININE: 0.65 mg/dL (ref 0.44–1.00)
Glucose, Bld: 128 mg/dL — ABNORMAL HIGH (ref 65–99)
Potassium: 3.9 mmol/L (ref 3.5–5.1)
SODIUM: 148 mmol/L — AB (ref 135–145)

## 2015-06-10 LAB — TRIGLYCERIDES: TRIGLYCERIDES: 2205 mg/dL — AB (ref <150)

## 2015-06-10 LAB — SODIUM: SODIUM: 141 mmol/L (ref 135–145)

## 2015-06-10 MED ORDER — MIDAZOLAM HCL 2 MG/2ML IJ SOLN
INTRAMUSCULAR | Status: AC
  Administered 2015-06-10: 4 mg via INTRAVENOUS
  Filled 2015-06-10: qty 4

## 2015-06-10 MED ORDER — VECURONIUM BROMIDE 10 MG IV SOLR
INTRAVENOUS | Status: AC
  Administered 2015-06-10: 10 mg via INTRAVENOUS
  Filled 2015-06-10: qty 10

## 2015-06-10 MED ORDER — PROPOFOL 1000 MG/100ML IV EMUL
5.0000 ug/kg/min | INTRAVENOUS | Status: DC
  Administered 2015-06-10: 5 ug/kg/min via INTRAVENOUS
  Administered 2015-06-10: 15 ug/kg/min via INTRAVENOUS
  Filled 2015-06-10 (×2): qty 100

## 2015-06-10 MED ORDER — FREE WATER
200.0000 mL | Status: DC
  Administered 2015-06-10 (×2): 200 mL

## 2015-06-10 MED ORDER — FLUCONAZOLE IN SODIUM CHLORIDE 200-0.9 MG/100ML-% IV SOLN
200.0000 mg | INTRAVENOUS | Status: DC
  Filled 2015-06-10: qty 100

## 2015-06-10 MED ORDER — VITAL HIGH PROTEIN PO LIQD: 1000.0000 mL | ORAL | Status: DC

## 2015-06-10 MED ORDER — MIDAZOLAM HCL 2 MG/2ML IJ SOLN
2.0000 mg | INTRAMUSCULAR | Status: DC | PRN
  Administered 2015-06-10 – 2015-06-16 (×10): 2 mg via INTRAVENOUS
  Filled 2015-06-10 (×8): qty 2

## 2015-06-10 MED ORDER — VECURONIUM BROMIDE 10 MG IV SOLR
10.0000 mg | Freq: Once | INTRAVENOUS | Status: AC
  Administered 2015-06-10: 10 mg via INTRAVENOUS

## 2015-06-10 MED ORDER — VITAL HIGH PROTEIN PO LIQD
1000.0000 mL | ORAL | Status: DC
  Administered 2015-06-10 (×2): 1000 mL

## 2015-06-10 MED ORDER — NOREPINEPHRINE 4 MG/250ML-% IV SOLN
0.0000 ug/min | INTRAVENOUS | Status: DC
  Administered 2015-06-10: 4 mg via INTRAVENOUS
  Administered 2015-06-10: 8 ug/min via INTRAVENOUS
  Administered 2015-06-10: 14 ug/min via INTRAVENOUS
  Administered 2015-06-10: 17 ug/min via INTRAVENOUS
  Administered 2015-06-11: 3 ug/min via INTRAVENOUS
  Administered 2015-06-11: 15 ug/min via INTRAVENOUS
  Administered 2015-06-12: 14 ug/min via INTRAVENOUS
  Administered 2015-06-12: 4 ug/min via INTRAVENOUS
  Administered 2015-06-12: 14 ug/min via INTRAVENOUS
  Administered 2015-06-12: 10 ug/min via INTRAVENOUS
  Administered 2015-06-13 (×2): 8 ug/min via INTRAVENOUS
  Administered 2015-06-13: 10 ug/min via INTRAVENOUS
  Administered 2015-06-14: 7 ug/min via INTRAVENOUS
  Filled 2015-06-10 (×11): qty 250

## 2015-06-10 MED ORDER — MIDAZOLAM HCL 2 MG/2ML IJ SOLN
4.0000 mg | Freq: Once | INTRAMUSCULAR | Status: AC
  Administered 2015-06-10: 4 mg via INTRAVENOUS

## 2015-06-10 MED ORDER — NOREPINEPHRINE BITARTRATE 1 MG/ML IV SOLN
0.0000 ug/min | INTRAVENOUS | Status: DC
  Administered 2015-06-10: 10 ug/min via INTRAVENOUS

## 2015-06-10 MED ORDER — FREE WATER
200.0000 mL | Freq: Three times a day (TID) | Status: DC
  Administered 2015-06-10 – 2015-06-11 (×2): 200 mL

## 2015-06-10 MED ORDER — STERILE WATER FOR INJECTION IJ SOLN
INTRAMUSCULAR | Status: AC
  Administered 2015-06-10: 10 mL
  Filled 2015-06-10: qty 10

## 2015-06-10 MED ORDER — FENTANYL 2500MCG IN NS 250ML (10MCG/ML) PREMIX INFUSION
0.0000 ug/h | INTRAVENOUS | Status: DC
  Administered 2015-06-10: 325 ug/h via INTRAVENOUS
  Administered 2015-06-11: 250 ug/h via INTRAVENOUS
  Administered 2015-06-11: 350 ug/h via INTRAVENOUS
  Administered 2015-06-11: 250 ug/h via INTRAVENOUS
  Administered 2015-06-12: 25 ug/h via INTRAVENOUS
  Administered 2015-06-12 – 2015-06-13 (×4): 300 ug/h via INTRAVENOUS
  Administered 2015-06-14 – 2015-06-16 (×5): 250 ug/h via INTRAVENOUS
  Filled 2015-06-10 (×15): qty 250

## 2015-06-10 MED ORDER — DEXMEDETOMIDINE HCL IN NACL 400 MCG/100ML IV SOLN
0.4000 ug/kg/h | INTRAVENOUS | Status: DC
  Administered 2015-06-10 (×2): 0.8 ug/kg/h via INTRAVENOUS
  Filled 2015-06-10 (×2): qty 100

## 2015-06-10 MED ORDER — MAGNESIUM SULFATE 2 GM/50ML IV SOLN
2.0000 g | Freq: Once | INTRAVENOUS | Status: AC
  Administered 2015-06-10: 2 g via INTRAVENOUS
  Filled 2015-06-10: qty 50

## 2015-06-10 MED ORDER — NOREPINEPHRINE 4 MG/250ML-% IV SOLN
INTRAVENOUS | Status: AC
  Administered 2015-06-10: 4 mg via INTRAVENOUS
  Filled 2015-06-10: qty 250

## 2015-06-10 MED ORDER — PRO-STAT SUGAR FREE PO LIQD
30.0000 mL | Freq: Every day | ORAL | Status: DC
  Administered 2015-06-10 – 2015-06-23 (×12): 30 mL

## 2015-06-10 NOTE — Progress Notes (Signed)
   06/10/15 1600  Clinical Encounter Type  Visited With Patient  Visit Type Follow-up  Consult/Referral To Chaplain  Chaplain checked in while rounds to see how patient was progressing. Chaplain Keyra Virella A. [place] Pellerito Ext. [phone]

## 2015-06-10 NOTE — Progress Notes (Signed)
Pressure lowered after sedation for paralytic earlier. Patient placed in trendelenberg position, MD notified. Blood pressure cuff then unable to read a blood pressure, two RNs unable to hear one manually. Levophed started per MD.

## 2015-06-10 NOTE — Progress Notes (Addendum)
Pharmacy Consult for Electrolyte Monitoring   Allergies  Allergen Reactions  . Atorvastatin Other (See Comments)    Does not remember why she had Intolerance to Lipitor.  . Rosuvastatin Anxiety    Chest tigtness and feeling as if she had heartburn.    Patient Measurements: Height: '5\' 7"'$  (170.2 cm) Weight: 252 lb 6.8 oz (114.5 kg) IBW/kg (Calculated) : 61.6   Vital Signs: Temp: 98.4 F (36.9 C) (11/15 0500) Temp Source: Oral (11/15 0500) BP: 113/61 mmHg (11/15 0600) Pulse Rate: 75 (11/15 0600) Intake/Output from previous day: 11/14 0701 - 11/15 0700 In: 1471.4 [I.V.:851.4; NG/GT:520; IV Piggyback:100] Out: 1420 [Urine:1420] Intake/Output from this shift: Total I/O In: 974.7 [I.V.:404.7; NG/GT:520; IV Piggyback:50] Out: 785 [Urine:785]  Labs:  Recent Labs  06/08/15 0517 06/09/15 0521 06/10/15 0511  WBC 1.8* 4.5 9.6  HGB 7.5* 7.4* 7.8*  HCT 23.3* 22.3* 23.9*  PLT 34* 53* 77*     Recent Labs  06/08/15 0517 06/09/15 0521 06/10/15 0511  NA 144 147* 148*  K 3.9 4.2 3.9  CL 108 109 108  CO2 28 33* 33*  GLUCOSE 126* 114* 128*  BUN '16 13 11  '$ CREATININE 0.73 0.66 0.65  CALCIUM 8.4* 8.3* 8.4*  MG 1.7 1.5* 1.6*  PHOS 3.3 3.7  --   PROT 4.8*  --   --   ALBUMIN 2.1*  --   --   AST 22  --   --   ALT 45  --   --   ALKPHOS 113  --   --   BILITOT 1.0  --   --    Estimated Creatinine Clearance: 89.2 mL/min (by C-G formula based on Cr of 0.65).    Recent Labs  06/08/15 1946 06/09/15 0005 06/09/15 0414  GLUCAP 110* 105* 102*    Medical History: Past Medical History  Diagnosis Date  . Anxiety   . Arthritis   . COPD (chronic obstructive pulmonary disease) ([place])   . CHF (congestive heart failure) ([place])   . Hyperlipidemia   . Hypertension   . Diabetes mellitus without complication ([place])   . Chronic kidney disease   . Neuromuscular disorder ([place])     Medications:  Scheduled:  . antiseptic oral rinse  7 mL Mouth Rinse 10 times per day  . budesonide  (PULMICORT) nebulizer solution  0.5 mg Nebulization BID  . chlorhexidine gluconate  15 mL Mouth Rinse BID  . famotidine (PEPCID) IV  20 mg Intravenous Q12H  . feeding supplement (VITAL HIGH PROTEIN)  1,000 mL Per Tube Q24H  . filgrastim  480 mcg Subcutaneous Daily  . free water  200 mL Per Tube BID  . furosemide  20 mg Intravenous BID  . ipratropium-albuterol  3 mL Nebulization Q6H  . metoprolol  5 mg Intravenous 4 times per day  . nitroGLYCERIN  1 inch Topical 4 times per day   Infusions:  . dexmedetomidine 0.8 mcg/kg/hr (06/10/15 0537)  . fentaNYL infusion INTRAVENOUS 10 mcg/hr (06/10/15 7915)    Assessment: Magnesium is low, but replaced already with 2 g magnesium sulfate this AM.   11/15 AM magnesium 1.6. Electrolyte consult no longer active.  Plan:  Will f/u AM labs.    Sim Boast, PharmD, BCPS  06/10/2015

## 2015-06-10 NOTE — Progress Notes (Signed)
PT Cancellation Note  Patient Details Name: Emily Pratt MRN: [id number] DOB: [birth year]   Cancelled Treatment:    Reason Eval/Treat Not Completed: Medical issues which prohibited therapy. Chart reviewed. SaO2 84% on vent support. Consulting ENT for trach placement. Will hold treatment until vitals are within acceptable limits for bed level exercises at minimum.  Lyndel Safe Jeffie Spivack PT, DPT   Ahmed Inniss 06/10/2015, 9:56 AM

## 2015-06-10 NOTE — Progress Notes (Signed)
Nutrition Follow-up   INTERVENTION:   EN: with current diprivan, recommend increasing TF to rate of 45 ml/hr with addition of Prostat daily; free water flushes increased by MD today. Continue to assess  NUTRITION DIAGNOSIS:   Inadequate oral intake related to acute illness as evidenced by NPO status. Being addressed via TF  GOAL:   Patient will meet greater than or equal to 90% of their needs  MONITOR:    (Energy Intake, Anthropometrics, Electrolyte/Renal Profile, Digestive System, Electrolyte/Renal Profile)  REASON FOR ASSESSMENT:   Consult Enteral/tube feeding initiation and management  ASSESSMENT:    Pt remains sedated on vent  EN: tolerating Vital High Protein at rate of 40 ml/hr, free water increased to 200 mL q 4 hours today  Digestive System:  No signs of TF intolerance, +loose [place] stool  Electrolyte and Renal Profile:  Recent Labs Lab 06/07/15 0540 06/08/15 0517 06/09/15 0521 06/10/15 0511  BUN '19 16 13 11  '$ CREATININE 0.73 0.73 0.66 0.65  NA 145 144 147* 148*  K 3.2* 3.9 4.2 3.9  MG 1.6* 1.7 1.5* 1.6*  PHOS 2.8 3.3 3.7  --    Glucose Profile:  Recent Labs  06/09/15 0005 06/09/15 0414 06/10/15 1120  GLUCAP 105* 102* 147*   Meds: diprivan at rate of 14 ml/hr (provides 370 kcals)  Height:   Ht Readings from Last 1 Encounters:  04/30/2015 '5\' 7"'$  (1.702 m)    Weight:   Wt Readings from Last 1 Encounters:  06/10/15 252 lb 6.8 oz (114.5 kg)   BMI:  Body mass index is 39.53 kg/(m^2).  Estimated Nutritional Needs:   Kcal:  1254-1596kcals, (11-14kcals/kg) using current weight of 114kg)  Protein:  92-122g protein (1.5-2.0g/kg)  Fluid:  1535-1873m of fluid (25-324mkg)  EDUCATION NEEDS:   No education needs identified at this time  HIStony BrookRD, LDN ([phone]

## 2015-06-10 NOTE — Care Management Note (Signed)
Case Management Note  Patient Details  Name: Emily Pratt MRN: [id number] Date of Birth: [birth year]  Subjective/Objective:   Re intubated 11/14 secondary to respiratory distress.                  Action/Plan: SNF  Expected Discharge Date:                  Expected Discharge Plan:     In-House Referral:     Discharge planning Services     Post Acute Care Choice:    Choice offered to:     DME Arranged:    DME Agency:     HH Arranged:    HH Agency:     Status of Service:  In process, will continue to follow  Medicare Important Message Given:    Date Medicare IM Given:    Medicare IM give by:    Date Additional Medicare IM Given:    Additional Medicare Important Message give by:     If discussed at [place] of Stay Meetings, dates discussed:    Additional Comments:  Jolly Mango, RN 06/10/2015, 9:19 AM

## 2015-06-10 NOTE — Progress Notes (Signed)
[facility] NAME: Emily Pratt    MR#:  [id number]  DATE OF BIRTH:  1946-04-23  SUBJECTIVE:  CHIEF COMPLAINT: Pt is awake and alert, extubated 11. 12, placed on BIPAP, was in significant discomfort , respiratory distress requiring initiation of Precedex due to agitation, intermittently on BiPAP and HFNC yesterday, very short of breath earlier today and was reintubated. Not able to review systems. Hypotensive after versed injection, also received lasix times 2-3 doses . Diarrheal stool. Not much output. Remains hypernatremic.  REVIEW OF SYSTEMS:   Limited ROS.  No fever or chills. No chest pain, palpitation, SOB. No nausea, vomiting.  DRUG ALLERGIES:   Allergies  Allergen Reactions  . Atorvastatin Other (See Comments)    Does not remember why she had Intolerance to Lipitor.  . Rosuvastatin Anxiety    Chest tigtness and feeling as if she had heartburn.    VITALS:  Blood pressure 116/62, pulse 153, temperature 98.1 F (36.7 C), temperature source Oral, resp. rate 20, height '5\' 7"'$  (1.702 m), weight 114.5 kg (252 lb 6.8 oz), SpO2 84 %.  PHYSICAL EXAMINATION:  GENERAL:  69 y.o.-year-old patient lying in the bed, somnolent, intubated , dyspneic  , ETT in place EYES: Pupils equal, round, reactive to light and accommodation. No scleral icterus.  HEENT: Head atraumatic, normocephalic. ETT in place NECK:  Supple, no jugular venous distention. No thyroid enlargement, no tenderness.  LUNGS: Diminished bilateral air entrance on the right,  , rhonchi bilaterally, more on R, good air entrance on the left  CARDIOVASCULAR: S1, S2 normal. No murmurs, rubs, or gallops.  ABDOMEN: Soft, minimally tender diffusely, distended. Bowel sounds present. No organomegaly or mass.  EXTREMITIES: +1 pedal edema, no cyanosis, or clubbing.  NEUROLOGIC: CN 2-12 not able to evaluate, strength not able to evaluate,  somnolent, unable to follow  commands PSYCHIATRIC: Somnolent, intubated, sedated  SKIN: Sacral decubitus ulcer  Rectal pouch has liquid stool, light color urine in Foley   LABORATORY PANEL:   CBC  Recent Labs Lab 06/10/15 0511  WBC 9.6  HGB 7.8*  HCT 23.9*  PLT 77*   ------------------------------------------------------------------------------------------------------------------  Chemistries   Recent Labs Lab 06/08/15 0517  06/10/15 0511  NA 144  < > 148*  K 3.9  < > 3.9  CL 108  < > 108  CO2 28  < > 33*  GLUCOSE 126*  < > 128*  BUN 16  < > 11  CREATININE 0.73  < > 0.65  CALCIUM 8.4*  < > 8.4*  MG 1.7  < > 1.6*  AST 22  --   --   ALT 45  --   --   ALKPHOS 113  --   --   BILITOT 1.0  --   --   < > = values in this interval not displayed. ------------------------------------------------------------------------------------------------------------------  Cardiac Enzymes No results for input(s): TROPONINI in the last 168 hours. ------------------------------------------------------------------------------------------------------------------  RADIOLOGY:  Dg Chest Port 1 View  06/09/2015  CLINICAL DATA:  Intubation and OG tube placement EXAM: PORTABLE CHEST 1 VIEW COMPARISON:  1 day prior FINDINGS: Endotracheal tube terminates 4.1 cm above carina.Nasogastric tube extends beyond the inferior aspect of the film. A right-sided PICC line is poorly visualized centrally but likely terminates at the low SVC. The patient is minimally rotated left. Cardiomegaly accentuated by AP portable technique. Layering right pleural effusion. No pneumothorax. Interstitial edema is asymmetric and worse on the right. Lower lobe predominant right  worse than left airspace disease is minimally improved. IMPRESSION: Minimal improvement in aeration since the exam of 1 day prior. Congestive heart failure with asymmetric interstitial and airspace opacities, worse on the right. Concurrent infection cannot be excluded. Layering right  pleural effusion. Electronically Signed   By: Abigail Miyamoto M.D.   On: 06/09/2015 12:09   Dg Abd Portable 1v  06/09/2015  CLINICAL DATA:  OG tube placement EXAM: PORTABLE ABDOMEN - 1 VIEW COMPARISON:  06/06/2015 FINDINGS: The nasogastric tube tip is in the body of stomach. Side port is well below the GE junction. Mild gaseous distension of large and small bowel loops unchanged. IMPRESSION: 1. NG tube tip is in the body of stomach. Electronically Signed   By: Kerby Moors M.D.   On: 06/09/2015 12:04    EKG:   Orders placed or performed during the hospital encounter of 05/01/2015  . EKG 12-Lead  . EKG 12-Lead    ASSESSMENT AND PLAN:   1. Acute on chronic respiratory failure with hypoxia and Hypercapnia,  - Multifactorial due to large right-sided pleural effusion/ CHF/ COPD / mass- SCLC - Pulmonology and oncology is following, extubated  November 12 th, 2016, was  intermittently on BiPAP and high flow nasal cannulas, reintubated 11.14.16 by DR Mortimer Fries . Discussed this patient's sister yesterday. - Continue nebulizer, steroids, off  antibiotics. Oncology gave chemotherapy October 31st 2016 with cisplatin and etoposide with some shrinking of the mass radiologically and clinically. Getting DR Tami Ribas to perform tracheostomy, as platelet count has rebounced back from chemo. Chest X-ray 06/09/2015 shows improvement in aeration   2. Acute Congestive heart failure: Mixed systolic and diastolic  - Echo 35/32 shows ejection fraction 30 -35% with abnormal filling - Continue metoprolol per cardiology's recommendations , resumed losartan. Given  Lasix y-day, discontinue it as patient may be intravascularly depleted.   3. Acute on chronic kidney injury:  - Due to ATN, hypovolemia and hypotension, resolved. - Nephrology signed off. Patient was restarted on losartan, and now is going to be started on Lasix intravenously, follow tomorrow morning.  Of IVF. Kidney function remains stable   4. Essential  hypertension, now hypotensive, likely due to medications, intravascular depletion:  continue metoprolol and losartan, norvasc, poor blood pressure control, likely due to stress and recently reintubation, continue current therapy initiating Lasix  5. Diabetes mellitus type 2: controlled. Holding home  medications Off Levemir , now just on sliding scale, blood glucose is controlled. Now back on tube feeds . Patient was hypoglycemic recently, blood glucose levels are ranging between 100-150 over the past 24 hours  6.  Small cell lung cancer, likely stage IV per oncology - Status post bronchoscopy 10/25, pathology show small cell lung cancer - Long-term history of heavy smoking - pathology reports were discussed with the patient and sister in the past.   - Oncology initiated chemotherapy ,  PICC line for chemotherapy is placed 05/26/2015-chest [place]: moderate right side pleural effusion, which is not seen on chest x-ray or CT scan of the chest.  CT chest: Revealed Interval response to therapy,  improved aeration to the right upper lobe,  decrease in size of central obstructing right perihilar lesion,  supraclavicular Adenopathy. Getting ENT to place TT , attempting to reach DR [facility] for that    7. Postobstructive pneumonia versus atelectasis - Blood cultures negative to date, respiratory, BAL, AFB normal flora - Discontinued vancomycin and Zosyn, completed 7 day Levaquin course  8. Arrhythmia, SVT versus paroxysmal atrial fibrillation during bronchoscopy, now  in sinus rhythm, continue metoprolol per cardiology's recommendations, no arrhythmias noted  9. Pancytopenia  following closely, may need transfusions with chemotherapy for lung ca. Improving overall, appreciate  Oncologist input, patienet is receiving Neupogen daily, white blood cell count is improving. No platelet transfusion at this time per oncology, as above 77,000 today. . F/u CBC in am. Neutropenic precautions and Isolation could be  discontinued.  10. Hypernatremia, advance tube water influx, follow Na later today  11. UTI, Ur cx showed yeast,  of Rocephin IV, off Diflucan  12. Ileus, following . X-ray revealed increased gas filled loops of the large and small bowel, unchanged from before  13 Hypokalemia. KCl. Resolved  14.  Hypomagnesemia.  Mag iv. Follow up level tomorrow morning.       CODE STATUS: Full code  TOTAL CRITICAL CARE TIME TAKING CARE OF THIS PATIENT: 45 minutes.    Theodoro Grist M.D on 06/10/2015 at 10:11 AM  Between 7am to 6pm - Pager - [phone] After 6pm go to [email or web address] - password [email or web address] [facility]  Office  [phone]  CC: Primary care physician; Dagoberto Ligas, MD

## 2015-06-10 NOTE — Clinical Social Work Note (Signed)
CSW staffed case with CSW Director during progression rounds today. Options are being discussed and looked at but currently it is undetermined how patient would get to and from chemo (which will run for 3 days) on a vent. Shela Leff MSW,LCSW [phone]

## 2015-06-10 NOTE — Progress Notes (Signed)
[place]  [phone]) [phone] [phone]) [phone]  ID: Emily Pratt OB: 09-03-1945  MR#: [id number]  YHC#:[id number]  Patient Care Team: Dagoberto Ligas, MD as PCP - General (Internal Medicine)  CHIEF COMPLAINT:  Chief Complaint  Patient presents with  . Respiratory Distress    INTERVAL HISTORY: Patient is extubated briefly over the weekend, but then subsequently reintubated secondary to respiratory distress. She is currently lethargic and difficult to arouse. No family is at bedside.Marland Kitchen   REVIEW OF SYSTEMS:   Review of Systems  Unable to perform ROS: intubated    PAST MEDICAL HISTORY: Past Medical History  Diagnosis Date  . Anxiety   . Arthritis   . COPD (chronic obstructive pulmonary disease) ([place])   . CHF (congestive heart failure) ([place])   . Hyperlipidemia   . Hypertension   . Diabetes mellitus without complication ([place])   . Chronic kidney disease   . Neuromuscular disorder ([place])     PAST SURGICAL HISTORY: Past Surgical History  Procedure Laterality Date  . Joint replacement Bilateral 2006    both knees  . Ankle surgery      FAMILY HISTORY Family History  Problem Relation Age of Onset  . Diabetes Mother   . Cancer Father   . Diabetes Sister   . Stroke Sister        ADVANCED DIRECTIVES:    HEALTH MAINTENANCE: Social History  Substance Use Topics  . Smoking status: Current Every Day Smoker    Types: Cigarettes  . Smokeless tobacco: None     Comment: 0.5 pack /day  . Alcohol Use: No     Colonoscopy:  PAP:  Bone density:  Lipid panel:  Allergies  Allergen Reactions  . Atorvastatin Other (See Comments)    Does not remember why she had Intolerance to Lipitor.  . Rosuvastatin Anxiety    Chest tigtness and feeling as if she had heartburn.    Current Facility-Administered Medications  Medication Dose Route Frequency Provider Last Rate Last Dose  . acetaminophen (TYLENOL) tablet 650 mg  650 mg Oral Q6H PRN Harrie Foreman, MD   650 mg at 06/06/15 5176   Or  . acetaminophen (TYLENOL) suppository 650 mg  650 mg Rectal Q6H PRN Harrie Foreman, MD      . antiseptic oral rinse solution (CORINZ)  7 mL Mouth Rinse 10 times per day Flora Lipps, MD   7 mL at 06/10/15 1330  . budesonide (PULMICORT) nebulizer solution 0.5 mg  0.5 mg Nebulization BID Flora Lipps, MD   0.5 mg at 06/10/15 0833  . chlorhexidine gluconate (PERIDEX) 0.12 % solution 15 mL  15 mL Mouth Rinse BID Flora Lipps, MD   15 mL at 06/10/15 0757  . famotidine (PEPCID) IVPB 20 mg premix  20 mg Intravenous Q12H Juanito Doom, MD   20 mg at 06/10/15 1116  . feeding supplement (PRO-STAT SUGAR FREE 64) liquid 30 mL  30 mL Per Tube Daily Flora Lipps, MD   30 mL at 06/10/15 1455  . feeding supplement (VITAL HIGH PROTEIN) liquid 1,000 mL  1,000 mL Per Tube Q24H Flora Lipps, MD   1,000 mL at 06/10/15 1400  . fentaNYL 2541mg in NS 25102m(1062mml) infusion-PREMIX  10 mcg/hr Intravenous Continuous KurFlora LippsD 25 mL/hr at 06/10/15 1545 250 mcg/hr at 06/10/15 1545  . free water 200 mL  200 mL Per Tube Q4H RimTheodoro GristD   200 mL at 06/10/15 1200  . hydrALAZINE (APRESOLINE) injection 10-20 mg  10-20 mg Intravenous Q4H PRN Wilhelmina Mcardle, MD      . ipratropium-albuterol (DUONEB) 0.5-2.5 (3) MG/3ML nebulizer solution 3 mL  3 mL Nebulization Q6H Vishal Mungal, MD   3 mL at 06/10/15 1459  . metoprolol (LOPRESSOR) injection 2.5-5 mg  2.5-5 mg Intravenous Q3H PRN Wilhelmina Mcardle, MD   2.5 mg at 06/08/15 2059  . metoprolol (LOPRESSOR) injection 5 mg  5 mg Intravenous 4 times per day Wilhelmina Mcardle, MD   Stopped at 06/10/15 1119  . norepinephrine (LEVOPHED) '4mg'$  in D5W 223m premix infusion  0-40 mcg/min Intravenous Titrated KFlora Lipps MD 30 mL/hr at 06/10/15 1545 8 mcg/min at 06/10/15 1545  . phenol (CHLORASEPTIC) mouth spray 1 spray  1 spray Mouth/Throat PRN DWilhelmina Mcardle MD      . propofol (DIPRIVAN) 1000 MG/100ML infusion  5-80 mcg/kg/min Intravenous  Titrated KFlora Lipps MD 20.6 mL/hr at 06/10/15 1500 30 mcg/kg/min at 06/10/15 1500    OBJECTIVE: Filed Vitals:   06/10/15 1600  BP: 132/62  Pulse: 111  Temp:   Resp: 11     Body mass index is 39.53 kg/(m^2).    ECOG FS:4 - Bedbound  General:  intubated Eyes: Pink conjunctiva, anicteric sclera. HEENT:  ET tube in place. Lungs: Clear to auscultation bilaterally. Heart: Regular rate and rhythm. No rubs, murmurs, or gallops. Abdomen: Soft, nontender, nondistended. No organomegaly noted, normoactive bowel sounds. Musculoskeletal: No edema, cyanosis, or clubbing. Neuro: Lethargic. Skin: No rashes or petechiae noted.   LAB RESULTS:  Lab Results  Component Value Date   NA 141 06/10/2015   K 3.9 06/10/2015   CL 108 06/10/2015   CO2 33* 06/10/2015   GLUCOSE 128* 06/10/2015   BUN 11 06/10/2015   CREATININE 0.65 06/10/2015   CALCIUM 8.4* 06/10/2015   PROT 4.8* 06/08/2015   ALBUMIN 2.1* 06/08/2015   AST 22 06/08/2015   ALT 45 06/08/2015   ALKPHOS 113 06/08/2015   BILITOT 1.0 06/08/2015   GFRNONAA >60 06/10/2015   GFRAA >60 06/10/2015    Lab Results  Component Value Date   WBC 9.6 06/10/2015   NEUTROABS 0.2* 06/03/2015   HGB 7.8* 06/10/2015   HCT 23.9* 06/10/2015   MCV 90.3 06/10/2015   PLT 77* 06/10/2015     STUDIES: Dg Chest 1 View  05/31/2015  CLINICAL DATA:  Dyspnea. EXAM: CHEST 1 VIEW COMPARISON:  05/30/2015 FINDINGS: ET tube tip is above the carina. There is a nasogastric tube in place. Right arm PICC line tip is in the SVC. Loculated right pleural effusion is unchanged from previous exam. Cardiac enlargement and aortic atherosclerosis again noted. IMPRESSION: 1. No change and large loculated right pleural effusion. 2. Cardiac enlargement and aortic atherosclerosis. Electronically Signed   By: TKerby MoorsM.D.   On: 05/31/2015 09:31   Dg Chest 1 View  05/30/2015  CLINICAL DATA:  Shortness of breath. EXAM: CHEST 1 VIEW COMPARISON:  05/29/2015.  CT 05/20/2015 .  FINDINGS: Endotracheal tube, NG tube, right PICC line in stable position. Persistent consolidation of the right lung and right pleural effusion again noted . Heart size stable. No pneumothorax. No acute osseous abnormality. IMPRESSION: 1. Lines and tubes in stable position. 2. Persistent consolidation of the right lung with prominent right pleural effusion. No interim change from prior exam. Electronically Signed   By: [place]  On: 05/30/2015 07:15   Dg Chest 1 View  05/29/2015  CLINICAL DATA:  Dyspnea, respiratory failure, COPD, lung mass EXAM: CHEST 1 VIEW  COMPARISON:  Portable chest x-ray of May 28, 2015 FINDINGS: There is persistent near total atelectasis of the right lung with some aeration noted in the lower hemi thorax. The left lung is clear. There is no significant mediastinal shift. The cardiac silhouette is normal in size. The endotracheal tube tip lies 4.6 cm above the carina. The esophagogastric tube tip projects below the inferior margin of the image. The right-sided PICC line tip projects over the midportion of the SVC. IMPRESSION: Persistent consolidation of the mid and upper right lung with small amount of residual aerated right lower lung. The left lung is clear. The support tubes are in reasonable position. Electronically Signed   By: David  Martinique M.D.   On: 05/29/2015 07:20   Dg Chest 1 View  05/28/2015  CLINICAL DATA:  Hypoxia EXAM: CHEST 1 VIEW COMPARISON:  May 26, 2015 FINDINGS: Endotracheal tube tip is 4.6 cm above the carina. Nasogastric tube tip and side port below the diaphragm. Central catheter tip is in the superior cava. No pneumothorax. There is complete opacification of the right hemithorax with volume loss and probable effusion. There is elevation of the right hemidiaphragm. The left lung is clear. The heart size is within normal limits. Pulmonary vascularity on the left is within normal limits. Pulmonary vascularity on the right is obscured. There is  atherosclerotic change throughout the aorta. IMPRESSION: Tube and catheter positions as described without pneumothorax. Complete opacification of the right hemithorax remains with evidence of volume loss and probable pleural effusion. Left lung clear. No change in cardiac silhouette. Electronically Signed   By: Lowella Grip III M.D.   On: 05/28/2015 07:18   Dg Chest 1 View  05/26/2015  CLINICAL DATA:  Wheezing and shortness of breath EXAM: CHEST 1 VIEW COMPARISON:  Portable chest x-ray of today's date FINDINGS: There is persistent 0 opacification of the mid and upper thirds of the right hemi thorax. Only a small amount of aerated lung is visible in the lower hemi thorax. Pleural fluid on the right is suspected. The cardiac silhouette is mildly enlarged. The left lung is well-expanded. There is no focal infiltrate or pleural effusion on the left. The endotracheal tube tip projects 4.2 cm above the carina. The PICC line tip on the left overlies the junction of the proximal and midportions of the SVC. IMPRESSION: Fairly stable appearance of the chest since the previous study with opacification of the upper 2/3 of the right hemithorax consistent with atelectasis-pneumonia and left pleural effusion. The left lung remains clear. Electronically Signed   By: David  Martinique M.D.   On: 05/26/2015 13:51   Dg Abd 1 View  06/06/2015  CLINICAL DATA:  69 year old female with abdominal distention and diarrhea. Initial encounter. EXAM: ABDOMEN - 1 VIEW COMPARISON:  06/02/2015 and earlier. FINDINGS: 2 Portable AP supine views at 1525 hours. Large body habitus. Improved bowel gas pattern since 05/30/2015, although increased gas-filled small and large bowel loops since 06/02/2015. Still, no dilated small bowel loops present. Large bowel caliber is at the upper limits of normal. Enteric tube remains in place. The side hole appears to be at the level of the diaphragm. Stable visualized osseous structures. IMPRESSION: 1.  Increased gas filled large and small bowel loops since 06/02/2015 most suggestive of ileus. 2. Enteric tube in place, but side hole at the level of the distal thoracic esophagus. Advance 6 cm to place the side hole within the stomach. Electronically Signed   By: Genevie Ann M.D.   On: 06/06/2015  15:41   Dg Abd 1 View  06/02/2015  CLINICAL DATA:  Status post OG tube placement. EXAM: ABDOMEN - 1 VIEW COMPARISON:  None. FINDINGS: OG tube is in place with the tip in the stomach. The tube could be advanced 3-4 cm for better positioning. IMPRESSION: As above. Electronically Signed   By: Inge Rise M.D.   On: 06/02/2015 15:21   Dg Abd 1 View  06/02/2015  CLINICAL DATA:  Orogastric tube placement. EXAM: ABDOMEN - 1 VIEW COMPARISON:  Earlier today at 0800 hours. FINDINGS: Motion and patient body habitus degradation. Nasogastric tube terminates at the body of the stomach. The side-port may be above the gastroesophageal junction. No gross free intraperitoneal air. Partial right hemi thorax opacification, incompletely imaged. IMPRESSION: Degraded exam, as detailed above. Nasogastric terminating at the body of the stomach. Electronically Signed   By: Abigail Miyamoto M.D.   On: 06/02/2015 09:28   Dg Abd 1 View  06/02/2015  CLINICAL DATA:  NG tube placement . EXAM: ABDOMEN - 1 VIEW COMPARISON:  05/30/2015 . FINDINGS: NG tube tip noted at the level of the gastroesophageal junction. More distal placement should be considered. Persistent bowel distention. No free air . IMPRESSION: NG tube tip noted at the level of the gastroesophageal junction. More distal placement should be considered.These results will be called to the ordering clinician or representative by the Radiologist Assistant, and communication documented in the PACS or zVision Dashboard. Electronically Signed   By: Marcello Moores  Register   On: 06/02/2015 08:19   Dg Abd 1 View  05/30/2015  CLINICAL DATA:  Lung cancer.  Distended abdomen. EXAM: ABDOMEN - 1 VIEW  COMPARISON:  05/20/2015 FINDINGS: Mild gaseous distention of the stomach and transverse colon. No small bowel dilatation to suggest obstruction. No organomegaly. No free air. IMPRESSION: Mild gaseous distention of the stomach and transverse colon. This may reflect mild ileus. Electronically Signed   By: Rolm Baptise M.D.   On: 05/30/2015 16:42   Ct Chest Wo Contrast  05/20/2015  CLINICAL DATA:  69 year old who presented with acute hypercapnic respiratory failure, current history of COPD and CHF, stage 3 chronic kidney disease, with an enlarging right pleural effusion and right paratracheal soft tissue on chest x-ray yesterday. EXAM: CT CHEST WITHOUT CONTRAST TECHNIQUE: Multidetector CT imaging of the chest was performed following the standard protocol without IV contrast. Intravenous contrast was not administered due to the patient's chronic kidney disease. COMPARISON:  No prior CT.  Chest x-rays 05/03/2015 and earlier. FINDINGS: Best seen on coronal reformatted images is abrupt occlusion of the right upper lobe bronchus and the right bronchus intermedius centrally. As a result, there is dense consolidation in the entire right lung with a areas of hypoattenuation giving a "drowned lung" appearance. Without intravenous contrast, it is difficult to visualize the large obstructing mass as it has similar attenuation to the consolidated lung. There is an associated small to moderate sized right pleural effusion. No pulmonary parenchymal nodules or masses involving the left lung. Linear scarring involving the medial left lower lobe. No confluent airspace consolidation in the left lung. No left pleural effusion. Apparent pleural thickening throughout the left hemithorax is related to abundant subpleural fat. Bulky right paratracheal conglomerate lymphadenopathy in station 2R and station 4R measuring approximately 5.6 x 5.0 x 7.4 cm. Enlarged lymph nodes elsewhere in the right side of the mediastinum. Enlarged right  retroclavicular lymph nodes, the largest measuring approximately 2.8 x 3.8 x 3.1 cm. The bulky mediastinal lymphadenopathy compresses the superior vena  cava, and there is a prominent right internal mammary vein collateral. Heart size upper normal. No pericardial effusion. Mild LAD and right coronary artery atherosclerosis. Moderate atherosclerosis involving the thoracic and upper abdominal aorta and their visualized branches. Approximate 3 cm cyst involving the posterior segment right lobe of liver. Within the limits of the unenhanced technique, no solid mass involving visualized liver. Approximate 1.6 x 2.6 x 2.2 cm low-attenuation mass involving the left adrenal gland. Solitary punctate calcification involving the proximal body of the pancreas. Visualized upper abdomen otherwise unremarkable for the unenhanced technique. IMPRESSION: 1. Obstructing central mass involving the right lung with occlusion of the right upper lobe bronchus and the bronchus intermedius with complete atelectasis of the right lung. The mass is difficult to visualize as it is of similar attenuation to the atelectatic lung. Bronchoscopy is recommended in further evaluation and for diagnosis. 2. Small to moderate-sized right pleural effusion. 3. Metastatic mediastinal lymphadenopathy as detailed above, the largest conglomerate nodal mass in the right paratracheal region. 4. Mild compression of the superior vena cava with a prominent right internal mammary vein collateral. 5. Approximate 3 cm cyst involving the posterior segment right lobe of the visualized liver. 6. Approximate 2.6 cm low-attenuation mass involving the left adrenal gland, likely adenoma. Electronically Signed   By: Evangeline Dakin M.D.   On: 05/20/2015 12:41   Ct Chest W Contrast  06/06/2015  CLINICAL DATA:  Followup chest radiograph. History of CHF, COPD and hypertension. EXAM: CT CHEST WITH CONTRAST TECHNIQUE: Multidetector CT imaging of the chest was performed during  intravenous contrast administration. CONTRAST:  100m OMNIPAQUE IOHEXOL 300 MG/ML  SOLN COMPARISON:  05/20/2015 FINDINGS: Mediastinum: The heart size is normal. No pericardial effusion. There is an endotracheal tube with tip above the carina. The nasogastric tube is present with tip in the body of the stomach. The index right supraclavicular lymph node measures 1.5 cm, image 5 of series 2. Previously 2.6 cm. Right paratracheal lymph node measure 2.0 cm, image 19 of series 2. Previously 3.3 cm. Pre-vascular lymph node measures 1.6 cm, image 25 of series 2. Previously 2.3 cm. Lungs/Pleura: There is a moderate size partially loculated right pleural effusion which has decreased in volume from previous exam. There has been interval improvement in aeration to the right upper lobe and superior segment of right lower lobe compatible with decrease in size of obstructing central right lung mass. Continued complete atelectasis and consolidation of the right middle lobe. The right lung mass is difficult to visualize and accurately remeasure. Mild atelectasis is noted within the posterior and medial left lower lobe. Upper Abdomen: Stable appearance of left adrenal nodule measuring 2.9 x 1.3 cm. The right adrenal gland is normal. Low to intermediate attenuation structure within the posterior right hepatic lobe measures 2.6 cm, image 57 of series 2. Unchanged from previous exam. There is mild perihepatic ascites noted. Musculoskeletal: Scoliosis deformity involves the thoracic spine. IMPRESSION: 1. Interval response to therapy. Since the previous exam there has been improved aeration to the right upper lobe and superior segment of the right lower lobe. Findings are compatible with decrease in size of central obstructing right perihilar lesion 2. Decrease in size of mediastinal and right supraclavicular adenopathy. 3. No new or progressive disease identified. Electronically Signed   By: TKerby MoorsM.D.   On: 06/06/2015 10:39    [place]hest  06/02/2015  CLINICAL DATA:  Pleural effusion.  Lung cancer. EXAM: CHEST ULTRASOUND COMPARISON:  Chest x-ray 06/02/2015. FINDINGS: Moderate right-sided pleural effusion  noted. IMPRESSION: Moderate right pleural effusion . Electronically Signed   By: Marcello Moores  Register   On: 06/02/2015 10:05   US Renal  05/21/2015  CLINICAL DATA:  Acute renal failure EXAM: RENAL / URINARY TRACT ULTRASOUND COMPLETE COMPARISON:  None. FINDINGS: Right Kidney: Length: 11 cm. Echogenicity within normal limits. No mass or hydronephrosis visualized. There is cortical thinning probable due to atrophy Left Kidney: Length: 12.2 cm. Cortical thinning is noted probable due to atrophy. Echogenicity within normal limits. No mass or hydronephrosis visualized. Bladder: The urinary bladder is decompressed with Foley catheter. IMPRESSION: 1. No hydronephrosis. No renal calculi. Bilateral cortical thinning probable due to atrophy. Decompressed urinary bladder with Foley catheter. Electronically Signed   By: Lahoma Crocker M.D.   On: 05/21/2015 09:25   Dg Chest Port 1 View  06/09/2015  CLINICAL DATA:  Intubation and OG tube placement EXAM: PORTABLE CHEST 1 VIEW COMPARISON:  1 day prior FINDINGS: Endotracheal tube terminates 4.1 cm above carina.Nasogastric tube extends beyond the inferior aspect of the film. A right-sided PICC line is poorly visualized centrally but likely terminates at the low SVC. The patient is minimally rotated left. Cardiomegaly accentuated by AP portable technique. Layering right pleural effusion. No pneumothorax. Interstitial edema is asymmetric and worse on the right. Lower lobe predominant right worse than left airspace disease is minimally improved. IMPRESSION: Minimal improvement in aeration since the exam of 1 day prior. Congestive heart failure with asymmetric interstitial and airspace opacities, worse on the right. Concurrent infection cannot be excluded. Layering right pleural effusion. Electronically  Signed   By: Abigail Miyamoto M.D.   On: 06/09/2015 12:09   Dg Chest Port 1 View  06/08/2015  CLINICAL DATA:  Respiratory failure.  Followup exam. EXAM: PORTABLE CHEST 1 VIEW COMPARISON:  Chest CT and chest radiographs, 06/06/2015. FINDINGS: There is near complete opacification of the right hemi thorax consistent with combination of atelectasis/ consolidation and pleural fluid. There is, however, some more aeration of the mid to upper lung on the right than there was on the prior chest radiographs. Left lung is essentially clear.  No pneumothorax. Right-sided PICC is stable. Endotracheal tube and nasogastric tube have been removed. IMPRESSION: 1. Mild improvement from prior study with some more aeration evident of the right mid and upper lung. This is consistent with a decrease in lung consolidation/atelectasis. There is still significant opacification throughout most of the right hemi thorax. No new abnormalities. Electronically Signed   By: Lajean Manes M.D.   On: 06/08/2015 08:14   Dg Chest Port 1 View  06/06/2015  CLINICAL DATA:  Respiratory failure . EXAM: PORTABLE CHEST 1 VIEW COMPARISON:  06/02/2015.  CT 05/20/2015 . FINDINGS: Endotracheal tube 8 cm above the carina at the thoracic inlet. Slight interim advancement should be considered. NG tube and right PICC line in stable position. Progressive consolidation/ atelectasis of the right lung and progressive right pleural effusion. Right pleural effusion is large. Left lung is clear. Stable cardiomegaly. IMPRESSION: 1. Endotracheal tube 8 cm above the carina at the thoracic inlet. Slightly mid aspect should be considered. NG tube and right PICC line stable position. 2. Progression of consolidation/atelectasis of the right lung with progression of the right pleural effusion. The right pleural effusion is large. 3. Stable cardiomegaly. 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 Unchanged position of endotracheal tube, terminating suitably above the carina, 4.5 cm. Unchanged gastric tube, terminating out of the field of view. Unchanged right upper extremity PICC, which terminates at the level the right mainstem bronchus. Overlying EKG leads and ventilator tubing. Atherosclerosis of the aortic arch. Dense opacity of the right lung persists, with obscuration the right hemidiaphragm, right heart borders, and the majority of the right lung. Minimal maintained aeration at the right apex. Airspace opacity at the left base persists, with similar aeration to the prior. No pneumothorax. IMPRESSION: Similar appearance of dense right-sided airspace opacity, likely a combination of lung consolidation, atelectasis, tumor tissue, and pleural effusion. Unchanged position of support apparatus, as above. Airspace disease at the left base, similar to the comparison, likely combination of atelectasis, edema, consolidation. Small left pleural effusion not excluded. Atherosclerosis. Signed, Dulcy Fanny. Earleen Newport, DO [facility] Electronically Signed   By: Corrie Mckusick D.O.   On: 06/02/2015 08:23   Dg Chest Port 1 View  05/26/2015  CLINICAL DATA:  Respiratory failure. EXAM: PORTABLE CHEST 1 VIEW COMPARISON:  05/25/2015 .  CT 05/20/2015. FINDINGS: Endotracheal tube and NG tube noted in stable position. Interim increase consolidation in the right upper lobe. Persistent right pleural effusion. Left lung is clear. Stable cardiomegaly. No pneumothorax. No acute osseus abnormality P IMPRESSION: Interim increase in consolidation of the right upper lobe with persistent right pleural  effusion. Persistent occlusion of the right mainstem bronchus appears to be present. Electronically Signed   By: [place]   On: 05/26/2015 07:13   Dg Chest Port 1 View  05/25/2015  CLINICAL DATA:  Respiratory failure. EXAM: PORTABLE CHEST 1 VIEW COMPARISON:  05/21/2015 FINDINGS: The ET tube tip is above the carina. The nasogastric tube tip is below the GE junction. Normal heart size. Aortic atherosclerosis. Partially loculated right pleural effusion is again identified and appears unchanged from the previous exam. Left lung is clear. IMPRESSION: 1. Loculated right pleural effusion is stable. 2. Left lung is clear. Electronically Signed   By: Kerby Moors M.D.   On: 05/25/2015 09:03   Dg Chest Port 1 View  05/21/2015  CLINICAL DATA:  Respiratory failure, intubated EXAM: PORTABLE CHEST 1 VIEW COMPARISON:  05/20/2015 FINDINGS: Cardiomegaly again noted. Stable endotracheal and NG tube position. Again noted almost complete opacification of the right hemi thorax. Chronic elevation of the right hemidiaphragm. Mild left basilar atelectasis. No convincing pulmonary edema. No pneumothorax. Atherosclerotic calcifications of thoracic aorta again noted. IMPRESSION: Stable endotracheal and NG tube position. Again noted almost complete opacification of the right hemi thorax. Chronic elevation of the right hemidiaphragm. Mild left basilar atelectasis. No convincing pulmonary edema. No pneumothorax. Atherosclerotic calcifications of thoracic aorta again noted. Electronically Signed   By: Lahoma Crocker M.D.   On: 05/21/2015 08:22   Dg Chest Port 1 View  05/20/2015  CLINICAL DATA:  Acute respiratory failure with hypoxia. Status post bronchoscopy and line placement. EXAM: PORTABLE CHEST 1 VIEW COMPARISON:  05/20/2015 at 1302 hours FINDINGS: Following bronchoscopy, there is now partial aeration of the right lung noted in the right upper to mid lung. There is also some aeration now noted in the right lower lung with a  band of opacity noted across the mid lung bordering the minor fissure. Left lung is hyperexpanded but essentially clear. No pneumothorax. Endotracheal tube is  stable with its tip 4.7 cm above the carina. Orogastric tube passes below the diaphragm. IMPRESSION: 1. Improved right lung aeration following bronchoscopy. No pneumothorax. 2. No other change. Electronically Signed   By: Lajean Manes M.D.   On: 05/20/2015 17:42   Dg Chest Port 1 View  05/20/2015  CLINICAL DATA:  Hypoxia EXAM: PORTABLE CHEST 1 VIEW COMPARISON:  Chest CT obtained earlier in the day; chest radiograph May 19, 2015 FINDINGS: Endotracheal tube tip is 4.6 cm above the carina. Nasogastric tube tip and side port are below the diaphragm. No pneumothorax. There is now complete opacification of the right hemi thorax, felt to be due to a combination of effusion and consolidation. Left lung is clear. Heart size is within normal limits. There is atherosclerotic change in the aorta. Mass lesions seen on CT are obscured on the right by the diffuse consolidation and effusion. IMPRESSION: Tube positions as described without pneumothorax. Left lung clear. Complete opacification on the right. Cardiac silhouette within normal limits. Atherosclerotic calcification noted. Electronically Signed   By: Lowella Grip III M.D.   On: 05/20/2015 13:18   Dg Chest Portable 1 View  04/29/2015  CLINICAL DATA:  69 year old female with increasing shortness of breath Coll the today. EXAM: PORTABLE CHEST 1 VIEW COMPARISON:  Chest x-ray 04/24/2015. FINDINGS: Enlarging right pleural effusion. Extensive opacities throughout the right mid to lower lung, which may simply reflect passive atelectasis, however, underlying airspace consolidation or underlying mass is not excluded. Persistent prominence of right paratracheal soft tissue highly concerning for lymphadenopathy. Left lung appears relatively clear, although the left base is poorly visualized medially (likely  technique related). No evidence of pulmonary edema. Heart size is normal. Atherosclerotic calcifications in the thoracic aorta. IMPRESSION: 1. Overall, there has been significant progression compared to the prior chest x-ray from 04/24/2015, with enlarging now a large right pleural effusion and worsening aeration throughout the right lung. This is unusual in the setting of a treated pneumonia, and given the presence of paratracheal soft tissue thickening on the right which is suspicious for lymphadenopathy, underlying neoplasm is strongly suspected. Further evaluation with contrast enhanced chest CT in is strongly recommended in the near future to evaluate for underlying neoplasm. 2. Atherosclerosis. Electronically Signed   By: Vinnie Langton M.D.   On: 05/25/2015 01:27   Dg Abd Portable 1v  06/09/2015  CLINICAL DATA:  OG tube placement EXAM: PORTABLE ABDOMEN - 1 VIEW COMPARISON:  06/06/2015 FINDINGS: The nasogastric tube tip is in the body of stomach. Side port is well below the GE junction. Mild gaseous distension of large and small bowel loops unchanged. IMPRESSION: 1. NG tube tip is in the body of stomach. Electronically Signed   By: Kerby Moors M.D.   On: 06/09/2015 12:04   Dg Abd Portable 1v  05/20/2015  CLINICAL DATA:  OG an indentation. EXAM: PORTABLE ABDOMEN - 1 VIEW COMPARISON:  None. FINDINGS: Enteric tube tip is adequately positioned in the body of the stomach. Mildly prominent gas-filled loops of small bowel are noted within the right lower quadrant. Overall bowel gas pattern is indeterminate. No evidence of free intraperitoneal air seen. Large dense opacity noted at the right lung base. IMPRESSION: 1. OG tube appears adequately positioned with tip in the region of the stomach body. 2. Large dense opacity at the right lung base, incompletely imaged, which could be consolidation or effusion. Electronically Signed   By: Franki Cabot M.D.   On: 05/20/2015 13:19    ASSESSMENT:  Small cell  lung cancer.   PLAN:    1. Small cell lung cancer: Patient is likely stage IV given her suspicious adrenal lesion, but full staging workup cannot be completed given her acute illness. Patient was extubated over the weekend, but then subsequently reintubated. CT scan results reviewed independently from last week revealed a positive response to her chemotherapy with decreased size of her mass. Her persistent requirement for mechanical ventilation may more likely be secondary to poor lung function and COPD than external compression of her mass at this point.  Patient has completed cycle 1 of treatment with Cisplatin and Etoposide. Cycle 2, day 1 of chemotherapy will be scheduled on June 16, 2015, but this may be delayed if patient requires tracheostomy or other medical procedures. She will ultimately require port placement once she is extubated. XRT would be helpful, but there are patient safety concerns transporting to radiation oncology.   2. Thrombocytopenia: Platelet count is improving. Monitor. 3. Anemia:  Likely secondary to chemotherapy. She does not require transfusion at this time, but will consider one in the future if her hemoglobin falls below 7.0. 4. Tracheostomy:  Case discussed with ENT and Pulmonology. Patient is still high risk for tracheostomy placement even with platelet count increased to 77. Will further discuss this week as an and consider tracheostomy next week if patient remains stable and platelet count increases further. 5. Disposition: Long-term ventilation and tracheostomy facilities have been discussed, but this poses a problem for transportation back and forth for chemotherapy. Case discussed with case management as well as long-term facility representative.   Will follow.   Lloyd Huger, MD   06/10/2015 4:16 PM

## 2015-06-10 NOTE — Progress Notes (Signed)
Pressure increased with levophed infusion. Blood pressure cord changed at 09:55 as well. Levophed titrated lower as blood pressure improved.

## 2015-06-10 NOTE — Progress Notes (Signed)
06/10/2015 5:09 PM  Emily Pratt [id number]  Trial of extubation has failed over the weekend.  Asked to reevaluate for possible trache.  Platelet count remains low at 77, HgB 7.8.    Temp:  [98 F (36.7 C)-99.3 F (37.4 C)] 99.3 F (37.4 C) (11/15 1700) Pulse Rate:  [64-154] 124 (11/15 1700) Resp:  [10-27] 16 (11/15 1700) BP: (62-160)/(43-75) 111/55 mmHg (11/15 1700) SpO2:  [84 %-99 %] 90 % (11/15 1700) FiO2 (%):  [40 %-60 %] 50 % (11/15 1602) Weight:  [114.5 kg (252 lb 6.8 oz)] 114.5 kg (252 lb 6.8 oz) (11/15 0414),     Intake/Output Summary (Last 24 hours) at 06/10/15 1709 Last data filed at 06/10/15 1600  Gross per 24 hour  Intake 1817.14 ml  Output   1435 ml  Net 382.14 ml    Results for orders placed or performed during the hospital encounter of 05/20/2015 (from the past 24 hour(s))  Basic metabolic panel     Status: Abnormal   Collection Time: 06/10/15  5:11 AM  Result Value Ref Range   Sodium 148 (H) 135 - 145 mmol/L   Potassium 3.9 3.5 - 5.1 mmol/L   Chloride 108 101 - 111 mmol/L   CO2 33 (H) 22 - 32 mmol/L   Glucose, Bld 128 (H) 65 - 99 mg/dL   BUN 11 6 - 20 mg/dL   Creatinine, Ser 0.65 0.44 - 1.00 mg/dL   Calcium 8.4 (L) 8.9 - 10.3 mg/dL   GFR calc non Af Amer >60 >60 mL/min   GFR calc Af Amer >60 >60 mL/min   Anion gap 7 5 - 15  Magnesium     Status: Abnormal   Collection Time: 06/10/15  5:11 AM  Result Value Ref Range   Magnesium 1.6 (L) 1.7 - 2.4 mg/dL  CBC     Status: Abnormal   Collection Time: 06/10/15  5:11 AM  Result Value Ref Range   WBC 9.6 3.6 - 11.0 K/uL   RBC 2.64 (L) 3.80 - 5.20 MIL/uL   Hemoglobin 7.8 (L) 12.0 - 16.0 g/dL   HCT 23.9 (L) 35.0 - 47.0 %   MCV 90.3 80.0 - 100.0 fL   MCH 29.4 26.0 - 34.0 pg   MCHC 32.6 32.0 - 36.0 g/dL   RDW 17.3 (H) 11.5 - 14.5 %   Platelets 77 (L) 150 - 440 K/uL  Triglycerides     Status: Abnormal   Collection Time: 06/10/15  9:50 AM  Result Value Ref Range   Triglycerides 2205 (H) <150 mg/dL   Glucose, capillary     Status: Abnormal   Collection Time: 06/10/15 11:20 AM  Result Value Ref Range   Glucose-Capillary 147 (H) 65 - 99 mg/dL  Sodium     Status: None   Collection Time: 06/10/15  3:00 PM  Result Value Ref Range   Sodium 141 135 - 145 mmol/L  Glucose, capillary     Status: Abnormal   Collection Time: 06/10/15  4:07 PM  Result Value Ref Range   Glucose-Capillary 140 (H) 65 - 99 mg/dL    SUBJECTIVE:  Intubated and sedated  OBJECTIVE:  Obese neck unchanged  IMPRESSION:  Failure to extubate with underlying lung disease and lung cancer  Pratt:  I had a long talk with Dr. Grayland Ormond this PM.  It is still very concerning with low platelet count and anemia.  Even with platelet transfusion perioperatively, she would be at high risk for bleeding postoperatively.  I will discuss with  my partners as I will be out of town after tomorrow.  I will contact ICU team and Dr. Grayland Ormond after our disussion.  The earliest would likely be Monday, this would give some time for platelets to hopefully increase even more.    Laval Cafaro T 06/10/2015, 5:09 PM   06/10/2015

## 2015-06-10 NOTE — Progress Notes (Signed)
Extubated 11/12 to intermittent BiPAP <> high flow [place] O2.  Re-intubated 11/14-now on full vet support, fio2 100% Will need trach-ENT consulted  Filed Vitals:   06/10/15 0600 06/10/15 0700 06/10/15 0800 06/10/15 0833  BP: 113/61 116/62    Pulse: 75 64    Temp:   98.1 F (36.7 C)   TempSrc:   Oral   Resp: 15 13    Height:      Weight:      SpO2: 93% 94%  90%   Obese, cognition intact, RASS 0 HEENT WNL JVP cannot be visualized Very coarse BS R>L, no wheezes Reg, no M Abd obese, soft, +BS Trace symmetric LE edema No focal neuro deficits, gcs 10T  BMP Latest Ref Rng 06/10/2015 06/09/2015 06/08/2015  Glucose 65 - 99 mg/dL 128(H) 114(H) 126(H)  BUN 6 - 20 mg/dL '11 13 16  '$ Creatinine 0.44 - 1.00 mg/dL 0.65 0.66 0.73  Sodium 135 - 145 mmol/L 148(H) 147(H) 144  Potassium 3.5 - 5.1 mmol/L 3.9 4.2 3.9  Chloride 101 - 111 mmol/L 108 109 108  CO2 22 - 32 mmol/L 33(H) 33(H) 28  Calcium 8.9 - 10.3 mg/dL 8.4(L) 8.3(L) 8.4(L)    CBC Latest Ref Rng 06/10/2015 06/09/2015 06/08/2015  WBC 3.6 - 11.0 K/uL 9.6 4.5 1.8(L)  Hemoglobin 12.0 - 16.0 g/dL 7.8(L) 7.4(L) 7.5(L)  Hematocrit 35.0 - 47.0 % 23.9(L) 22.3(L) 23.3(L)  Platelets 150 - 440 K/uL 77(L) 53(L) 34(L)     IMPRESSION: Acute respiratory failure with hypoxemia from acute COPD exacerbation with acute Tx for Small cell lung cancer New diagnosis of small cell ca - s/p chemo, XRT  PLAN/REC: 1.Respiratory Failure -continue Full MV support -continue Bronchodilator Therapy -Wean Fio2 and PEEP as tolerated Will need trach for survival Cont empiric nebulized bronchodilators and steroids  2.Lung mass On chemo therapy  3.COPD-continue bd therapy and streroids     I have personally obtained a history, examined the patient, evaluated Pertinent laboratory and RadioGraphic/imaging results, and  formulated the assessment and plan  The Patient requires high complexity decision making for assessment and support, frequent evaluation and  titration of therapies, application of advanced monitoring technologies and extensive interpretation of multiple databases. Critical Care Time devoted to patient care services described in this note is 40 minutes.   Overall, patient is critically ill, prognosis is guarded.  Patient with Multiorgan failure and at high risk for cardiac arrest and death.    Corrin Parker, M.D.  [facility]  Medical Director [place] Director [facility] Cardio-Pulmonary Department

## 2015-06-10 NOTE — Progress Notes (Signed)
Patient became very agitated. MD present, ordered 10 mg of vecuronium, continuous sedation increased since patient being paralyzed and 4 mg of IV one time versed. Patient calmed with sedation and then paralyzed to increase synchrony with ventilator per MD order.

## 2015-06-10 NOTE — Progress Notes (Signed)
[facility] Progress Note Patient Name: Emily Pratt DOB: April 20, 1946 MRN: [id number]   Date of Service  06/10/2015  HPI/Events of Note  Patient is on a Propofol IV infusion. Triglyceride level = 2205.   eICU Interventions  Will order: 1. D/C Propofol IV infusion. 2. Triglyceride level in AM. 3. Change Fentanyl IV infusion 0 - 400 mcg/hour to titrate to RASS 0 to -1.  4. Add Versed 2 mg IV Q 2 hours PRN.      Intervention Category Intermediate Interventions: Other:  Lysle Dingwall 06/10/2015, 4:37 PM

## 2015-06-11 LAB — CBC
HCT: 25.2 % — ABNORMAL LOW (ref 35.0–47.0)
Hemoglobin: 7.9 g/dL — ABNORMAL LOW (ref 12.0–16.0)
MCH: 28.6 pg (ref 26.0–34.0)
MCHC: 31.6 g/dL — AB (ref 32.0–36.0)
MCV: 90.7 fL (ref 80.0–100.0)
Platelets: 119 10*3/uL — ABNORMAL LOW (ref 150–440)
RBC: 2.78 MIL/uL — ABNORMAL LOW (ref 3.80–5.20)
RDW: 18.1 % — AB (ref 11.5–14.5)
WBC: 21.1 10*3/uL — AB (ref 3.6–11.0)

## 2015-06-11 LAB — GLUCOSE, CAPILLARY
Glucose-Capillary: 120 mg/dL — ABNORMAL HIGH (ref 65–99)
Glucose-Capillary: 124 mg/dL — ABNORMAL HIGH (ref 65–99)
Glucose-Capillary: 131 mg/dL — ABNORMAL HIGH (ref 65–99)
Glucose-Capillary: 138 mg/dL — ABNORMAL HIGH (ref 65–99)
Glucose-Capillary: 152 mg/dL — ABNORMAL HIGH (ref 65–99)
Glucose-Capillary: 161 mg/dL — ABNORMAL HIGH (ref 65–99)
Glucose-Capillary: 173 mg/dL — ABNORMAL HIGH (ref 65–99)

## 2015-06-11 LAB — BASIC METABOLIC PANEL
Anion gap: 4 — ABNORMAL LOW (ref 5–15)
BUN: 14 mg/dL (ref 6–20)
CALCIUM: 8.3 mg/dL — AB (ref 8.9–10.3)
CO2: 33 mmol/L — ABNORMAL HIGH (ref 22–32)
Chloride: 104 mmol/L (ref 101–111)
Creatinine, Ser: 0.83 mg/dL (ref 0.44–1.00)
GFR calc non Af Amer: >60 mL/min (ref >60)
Glucose, Bld: 167 mg/dL — ABNORMAL HIGH (ref 65–99)
Potassium: 3.6 mmol/L (ref 3.5–5.1)
SODIUM: 141 mmol/L (ref 135–145)

## 2015-06-11 LAB — TRIGLYCERIDES: TRIGLYCERIDES: 301 mg/dL — AB (ref <150)

## 2015-06-11 MED ORDER — SENNOSIDES-DOCUSATE SODIUM 8.6-50 MG PO TABS
1.0000 | ORAL_TABLET | Freq: Two times a day (BID) | ORAL | Status: DC
  Administered 2015-06-11 – 2015-06-12 (×3): 1 via ORAL
  Filled 2015-06-11 (×3): qty 1

## 2015-06-11 MED ORDER — ALTEPLASE 2 MG IJ SOLR
2.0000 mg | Freq: Once | INTRAMUSCULAR | Status: AC
  Administered 2015-06-11: 2 mg
  Filled 2015-06-11: qty 2

## 2015-06-11 MED ORDER — FREE WATER
200.0000 mL | Freq: Two times a day (BID) | Status: DC
  Administered 2015-06-11 – 2015-06-16 (×9): 200 mL

## 2015-06-11 MED ORDER — VITAL HIGH PROTEIN PO LIQD
1000.0000 mL | ORAL | Status: DC
  Administered 2015-06-11 – 2015-06-16 (×8): 1000 mL
  Administered 2015-06-16 – 2015-06-17 (×7)
  Administered 2015-06-17 – 2015-06-18 (×2): 1000 mL

## 2015-06-11 NOTE — Progress Notes (Signed)
Nutrition Follow-up    INTERVENTION:   EN: recommend increasing TF to rate of 60 ml/hr with continuation of Prostat daily providing 1540 kcals, 142 g of protein. Continue to assess   NUTRITION DIAGNOSIS:   Inadequate oral intake related to acute illness as evidenced by NPO status. Being addressed via TF  GOAL:   Patient will meet greater than or equal to 90% of their needs  MONITOR:    (Energy Intake, Anthropometrics, Electrolyte/Renal Profile, Digestive System, Electrolyte/Renal Profile)  REASON FOR ASSESSMENT:   Consult Enteral/tube feeding initiation and management  ASSESSMENT:   Pt remains sedated on vent, platelet count improving, trach pending, on levophed at present  EN: tolerating Vital High Protein at rate of 45 ml/hr, free water changed to 200 mL q 8 hours  Digestive System: no signs of TF intolerance, no BM in over a day, flexiseal to be removed  Skin: stage II pressure ulcer on sacrum  Electrolyte and Renal Profile:  Recent Labs Lab 06/07/15 0540 06/08/15 0517 06/09/15 0521 06/10/15 0511 06/10/15 1500 06/11/15 0502  BUN '19 16 13 11  '$ --  14  CREATININE 0.73 0.73 0.66 0.65  --  0.83  NA 145 144 147* 148* 141 141  K 3.2* 3.9 4.2 3.9  --  3.6  MG 1.6* 1.7 1.5* 1.6*  --   --   PHOS 2.8 3.3 3.7  --   --   --    Glucose Profile:   Recent Labs  06/11/15 0345 06/11/15 0743 06/11/15 1151  GLUCAP 161* 152* 173*   Nutritional Anemia Profile:  CBC Latest Ref Rng 06/11/2015 06/10/2015 06/09/2015  WBC 3.6 - 11.0 K/uL 21.1(H) 9.6 4.5  Hemoglobin 12.0 - 16.0 g/dL 7.9(L) 7.8(L) 7.4(L)  Hematocrit 35.0 - 47.0 % 25.2(L) 23.9(L) 22.3(L)  Platelets 150 - 440 K/uL 119(L) 77(L) 53(L)    Lipid Profile:     Component Value Date/Time   TRIG 301* 06/11/2015 0502   Meds: levophed, fentanyl, versed, off diprivan  Height:   Ht Readings from Last 1 Encounters:  05/04/2015 '5\' 7"'$  (1.702 m)    Weight:   Wt Readings from Last 1 Encounters:  06/11/15 257 lb  4.4 oz (116.7 kg)    BMI:  Body mass index is 40.29 kg/(m^2).  Estimated Nutritional Needs:   Kcal:  1254-1596kcals, (11-14kcals/kg) using current weight of 114kg)  Protein:  122-153 g (2.0-2.5 g/kg)   Fluid:  1535-1828m of fluid (25-372mkg)  EDUCATION NEEDS:   No education needs identified at this time  CaAleutians WestRDMelbourne BeachLDRiverdale Park3479 [phone]

## 2015-06-11 NOTE — Consult Note (Signed)
WOC wound consult note Reason for Consult: Stage 2 pressure injury to sacrum Wound type:stage 2 pressure injury Pressure Ulcer POA: No Measurement:4 cm x 4 cm x 0.1 cm  Wound MCR:FVOHKGO pink and moist Drainage (amount, consistency, odor) Scant serous Periwound:intact  Rectal tube in place due to diarrhea Dressing procedure/placement/frequency:Cleanse sacrum with soap and water and pat dry.  Apply allevyn silicone border foam dressing.  Change every 3 days and PRN soilage.  Will not follow at this time.  Please re-consult if needed.  Domenic Moras RN BSN [place] Pager [phone]

## 2015-06-11 NOTE — Progress Notes (Signed)
PT Cancellation Note  Patient Details Name: ORIA KLIMAS MRN: [id number] DOB: [birth year]   Cancelled Treatment:    Reason Eval/Treat Not Completed: Medical issues which prohibited therapy. PT is acknowledging discontinue orders placed this AM. PT will complete orders for this patient, when patient is appropriate for mobility consult please feel free to place new orders.   Kerman Passey, PT, DPT    06/11/2015, 4:53 PM

## 2015-06-11 NOTE — Progress Notes (Signed)
MEDICATION RELATED CONSULT NOTE - INITIAL   Pharmacy Consult for Constipation Prevention   Allergies  Allergen Reactions  . Atorvastatin Other (See Comments)    Does not remember why she had Intolerance to Lipitor.  . Rosuvastatin Anxiety    Chest tigtness and feeling as if she had heartburn.    Patient Measurements: Height: '5\' 7"'$  (170.2 cm) Weight: 257 lb 4.4 oz (116.7 kg) IBW/kg (Calculated) : 61.6   Vital Signs: Temp: 98.9 F (37.2 C) (11/16 0345) Temp Source: Oral (11/16 0345) BP: 99/46 mmHg (11/16 0615) Pulse Rate: 88 (11/16 0630) Intake/Output from previous day: 11/15 0701 - 11/16 0700 In: 3539.4 [I.V.:1639.4; NG/GT:1800; IV Piggyback:100] Out: 770 [Urine:740; Emesis/NG output:30] Intake/Output from this shift:    Labs:  Recent Labs  06/09/15 0521 06/10/15 0511 06/11/15 0502  WBC 4.5 9.6 21.1*  HGB 7.4* 7.8* 7.9*  HCT 22.3* 23.9* 25.2*  PLT 53* 77* 119*  CREATININE 0.66 0.65 0.83  MG 1.5* 1.6*  --   PHOS 3.7  --   --    Estimated Creatinine Clearance: 86.8 mL/min (by C-G formula based on Cr of 0.83).   Microbiology: Recent Results (from the past 720 hour(s))  MRSA PCR Screening     Status: None   Collection Time: 05/10/2015 12:28 AM  Result Value Ref Range Status   MRSA by PCR NEGATIVE NEGATIVE Final    Comment:        The GeneXpert MRSA Assay (FDA approved for NASAL specimens only), is one component of a comprehensive MRSA colonization surveillance program. It is not intended to diagnose MRSA infection nor to guide or monitor treatment for MRSA infections.   Culture, blood (routine x 2)     Status: None   Collection Time: 05/11/2015  1:13 AM  Result Value Ref Range Status   Specimen Description BLOOD RIGHT HAND  Final   Special Requests BOTTLES DRAWN AEROBIC AND ANAEROBIC 3CC  Final   Culture NO GROWTH 5 DAYS  Final   Report Status 05/24/2015 FINAL  Final  Culture, blood (routine x 2)     Status: None   Collection Time: 05/12/2015  1:13 AM   Result Value Ref Range Status   Specimen Description BLOOD RIGHT ASSIST CONTROL  Final   Special Requests BOTTLES DRAWN AEROBIC AND ANAEROBIC 4CC  Final   Culture NO GROWTH 5 DAYS  Final   Report Status 05/24/2015 FINAL  Final  Culture, expectorated sputum-assessment     Status: None   Collection Time: 05/20/15  2:00 PM  Result Value Ref Range Status   Specimen Description EXPECTORATED SPUTUM  Final   Special Requests NONE  Final   Sputum evaluation THIS SPECIMEN IS ACCEPTABLE FOR SPUTUM CULTURE  Final   Report Status 05/20/2015 FINAL  Final  Culture, respiratory (NON-Expectorated)     Status: None   Collection Time: 05/20/15  2:00 PM  Result Value Ref Range Status   Specimen Description EXPECTORATED SPUTUM  Final   Special Requests NONE Reflexed from [id number]  Final   Gram Stain   Final    FEW WBC SEEN FEW GRAM POSITIVE RODS FAIR SPECIMEN - 70-80% WBCS    Culture APPEARS TO BE NORMAL FLORA  Final   Report Status 05/22/2015 FINAL  Final  Culture, bal-quantitative     Status: None   Collection Time: 05/20/15  5:05 PM  Result Value Ref Range Status   Specimen Description BRONCHIAL ALVEOLAR LAVAGE  Final   Special Requests Normal  Final   Gram Stain  Final    MODERATE WBC SEEN RARE GRAM POSITIVE RODS RARE GRAM POSITIVE COCCI IN CHAINS GOOD SPECIMEN - 80-90% WBCS    Culture APPEARS TO BE NORMAL FLORA  Final   Report Status 05/22/2015 FINAL  Final  Fungus Culture with Smear     Status: None   Collection Time: 05/20/15  5:05 PM  Result Value Ref Range Status   Specimen Description SPUTUM  Final   Special Requests Normal  Final   Culture CANDIDA DUBLINIENSIS  Final   Report Status 06/10/2015 FINAL  Final  C difficile quick scan w PCR reflex     Status: None   Collection Time: 05/29/15  4:50 PM  Result Value Ref Range Status   C Diff antigen NEGATIVE NEGATIVE Final   C Diff toxin NEGATIVE NEGATIVE Final   C Diff interpretation Negative for C. difficile  Final  Urine culture      Status: None   Collection Time: 05/29/15  6:19 PM  Result Value Ref Range Status   Specimen Description URINE, RANDOM  Final   Special Requests NONE  Final   Culture >=100,000 COLONIES/mL CANDIDA ALBICANS  Final   Report Status 06/04/2015 FINAL  Final    Medical History: Past Medical History  Diagnosis Date  . Anxiety   . Arthritis   . COPD (chronic obstructive pulmonary disease) ([place])   . CHF (congestive heart failure) ([place])   . Hyperlipidemia   . Hypertension   . Diabetes mellitus without complication ([place])   . Chronic kidney disease   . Neuromuscular disorder ([place])     Medications:  Scheduled:  . antiseptic oral rinse  7 mL Mouth Rinse 10 times per day  . budesonide (PULMICORT) nebulizer solution  0.5 mg Nebulization BID  . chlorhexidine gluconate  15 mL Mouth Rinse BID  . famotidine (PEPCID) IV  20 mg Intravenous Q12H  . feeding supplement (PRO-STAT SUGAR FREE 64)  30 mL Per Tube Daily  . feeding supplement (VITAL HIGH PROTEIN)  1,000 mL Per Tube Q24H  . free water  200 mL Per Tube 3 times per day  . ipratropium-albuterol  3 mL Nebulization Q6H  . metoprolol  5 mg Intravenous 4 times per day  . senna-docusate  1 tablet Oral BID   Infusions:  . fentaNYL infusion INTRAVENOUS 250 mcg/hr (06/11/15 1112)  . norepinephrine Stopped (06/11/15 1118)    Assessment: Pharmacy consulted to assist in constipation prevention in this 69 y/o F with multiple medical problems who is currently intubated and sedated.   Plan:  Patient has rectal tube with no recent output which is to be removed. Will begin senna-docusate 1 tablet po bid. Will f/u am.   Ulice Dash D 06/11/2015,12:05 PM

## 2015-06-11 NOTE — Progress Notes (Signed)
CC: follow up resp failure HPI remains intubated, sedated, critically ill Re-intubated 11/14-now on full vet support, fio2 100% Will need trach-ENT consulted plt count 116  Filed Vitals:   06/11/15 0545 06/11/15 0600 06/11/15 0615 06/11/15 0630  BP: 113/49 107/46 99/46   Pulse: 87 85 87 88  Temp:      TempSrc:      Resp: '17 15 13 15  '$ Height:      Weight:      SpO2: 91% 92% 92% 95%   Obese, cognition intact, RASS 0 HEENT WNL JVP cannot be visualized Very coarse BS R>L, no wheezes Reg, no M Abd obese, soft, +BS Trace symmetric LE edema No focal neuro deficits, gcs 10T  BMP Latest Ref Rng 06/11/2015 06/10/2015 06/10/2015  Glucose 65 - 99 mg/dL 167(H) - 128(H)  BUN 6 - 20 mg/dL 14 - 11  Creatinine 0.44 - 1.00 mg/dL 0.83 - 0.65  Sodium 135 - 145 mmol/L 141 141 148(H)  Potassium 3.5 - 5.1 mmol/L 3.6 - 3.9  Chloride 101 - 111 mmol/L 104 - 108  CO2 22 - 32 mmol/L 33(H) - 33(H)  Calcium 8.9 - 10.3 mg/dL 8.3(L) - 8.4(L)    CBC Latest Ref Rng 06/11/2015 06/10/2015 06/09/2015  WBC 3.6 - 11.0 K/uL 21.1(H) 9.6 4.5  Hemoglobin 12.0 - 16.0 g/dL 7.9(L) 7.8(L) 7.4(L)  Hematocrit 35.0 - 47.0 % 25.2(L) 23.9(L) 22.3(L)  Platelets 150 - 440 K/uL 119(L) 77(L) 53(L)     IMPRESSION: Acute respiratory failure with hypoxemia from acute COPD exacerbation with acute Tx for Small cell lung cancer New diagnosis of small cell ca - s/p chemo, XRT  PLAN/REC: 1.Respiratory Failure -continue Full MV support -continue Bronchodilator Therapy -Wean Fio2 and PEEP as tolerated Will need trach for survival Cont empiric nebulized bronchodilators-will reconsider adding steroids 2.Lung mass On chemo therapy  3.COPD-continue bd therapy and streroids     I have personally obtained a history, examined the patient, evaluated Pertinent laboratory and RadioGraphic/imaging results, and  formulated the assessment and plan  The Patient requires high complexity decision making for assessment and support,  frequent evaluation and titration of therapies, application of advanced monitoring technologies and extensive interpretation of multiple databases. Critical Care Time devoted to patient care services described in this note is 40 minutes.   Overall, patient is critically ill, prognosis is guarded.  Patient with Multiorgan failure and at high risk for cardiac arrest and death.    Corrin Parker, M.D.  [facility]  Medical Director [place] Director [facility] Cardio-Pulmonary Department

## 2015-06-11 NOTE — Care Management Note (Signed)
Case Management Note  Patient Details  Name: Emily Pratt MRN: [id number] Date of Birth: 23-Jan-1946  Subjective/Objective:   PLT count needed to be > 100 for trach to be considered. PLT count 119. ENT consulted for trach. Patient to have 2nd cycle of chemo next week. Discussed with Dr. Grayland Ormond. Patient is to receive the 2 drug regimen over 3 days with the cycle being every 21 days.                Action/Plan:   Expected Discharge Date:                  Expected Discharge Plan:     In-House Referral:     Discharge planning Services     Post Acute Care Choice:    Choice offered to:     DME Arranged:    DME Agency:     HH Arranged:    HH Agency:     Status of Service:  In process, will continue to follow  Medicare Important Message Given:    Date Medicare IM Given:    Medicare IM give by:    Date Additional Medicare IM Given:    Additional Medicare Important Message give by:     If discussed at [place] of Stay Meetings, dates discussed:    Additional Comments:  Jolly Mango, RN 06/11/2015, 1:30 PM

## 2015-06-11 NOTE — Progress Notes (Signed)
06/11/2015 1:32 PM  [place] Plan [id number]  Failure to extubate    Temp:  [98.6 F (37 C)-99.3 F (37.4 C)] 99.1 F (37.3 C) (11/16 0800) Pulse Rate:  [77-185] 99 (11/16 1300) Resp:  [10-24] 13 (11/16 1300) BP: (84-166)/(31-110) 89/39 mmHg (11/16 1300) SpO2:  [72 %-100 %] 93 % (11/16 1300) FiO2 (%):  [50 %-75 %] 75 % (11/16 1200) Weight:  [116.7 kg (257 lb 4.4 oz)] 116.7 kg (257 lb 4.4 oz) (11/16 0345),     Intake/Output Summary (Last 24 hours) at 06/11/15 1332 Last data filed at 06/11/15 1300  Gross per 24 hour  Intake 4007.32 ml  Output    615 ml  Net 3392.32 ml    Results for orders placed or performed during the hospital encounter of 05/08/2015 (from the past 24 hour(s))  Sodium     Status: None   Collection Time: 06/10/15  3:00 PM  Result Value Ref Range   Sodium 141 135 - 145 mmol/L  Glucose, capillary     Status: Abnormal   Collection Time: 06/10/15  4:07 PM  Result Value Ref Range   Glucose-Capillary 140 (H) 65 - 99 mg/dL  Glucose, capillary     Status: Abnormal   Collection Time: 06/10/15  8:08 PM  Result Value Ref Range   Glucose-Capillary 138 (H) 65 - 99 mg/dL  Glucose, capillary     Status: Abnormal   Collection Time: 06/11/15 12:01 AM  Result Value Ref Range   Glucose-Capillary 138 (H) 65 - 99 mg/dL  Glucose, capillary     Status: Abnormal   Collection Time: 06/11/15  3:45 AM  Result Value Ref Range   Glucose-Capillary 161 (H) 65 - 99 mg/dL  Triglycerides     Status: Abnormal   Collection Time: 06/11/15  5:02 AM  Result Value Ref Range   Triglycerides 301 (H) <150 mg/dL  CBC     Status: Abnormal   Collection Time: 06/11/15  5:02 AM  Result Value Ref Range   WBC 21.1 (H) 3.6 - 11.0 K/uL   RBC 2.78 (L) 3.80 - 5.20 MIL/uL   Hemoglobin 7.9 (L) 12.0 - 16.0 g/dL   HCT 25.2 (L) 35.0 - 47.0 %   MCV 90.7 80.0 - 100.0 fL   MCH 28.6 26.0 - 34.0 pg   MCHC 31.6 (L) 32.0 - 36.0 g/dL   RDW 18.1 (H) 11.5 - 14.5 %   Platelets 119 (L) 150 - 440 K/uL  Basic  metabolic panel     Status: Abnormal   Collection Time: 06/11/15  5:02 AM  Result Value Ref Range   Sodium 141 135 - 145 mmol/L   Potassium 3.6 3.5 - 5.1 mmol/L   Chloride 104 101 - 111 mmol/L   CO2 33 (H) 22 - 32 mmol/L   Glucose, Bld 167 (H) 65 - 99 mg/dL   BUN 14 6 - 20 mg/dL   Creatinine, Ser 0.83 0.44 - 1.00 mg/dL   Calcium 8.3 (L) 8.9 - 10.3 mg/dL   GFR calc non Af Amer >60 >60 mL/min   GFR calc Af Amer >60 >60 mL/min   Anion gap 4 (L) 5 - 15  Glucose, capillary     Status: Abnormal   Collection Time: 06/11/15  7:43 AM  Result Value Ref Range   Glucose-Capillary 152 (H) 65 - 99 mg/dL  Glucose, capillary     Status: Abnormal   Collection Time: 06/11/15 11:51 AM  Result Value Ref Range   Glucose-Capillary 173 (H) 65 -  99 mg/dL    SUBJECTIVE:  Remains intubated  OBJECTIVE:  No change  IMPRESSION:  Failure to extubate  PLAN:  Spoke with my partner Dr. Kathyrn Sheriff last night.  He is aware of the case.  I have tentatively scheduled Ms. Gorelick for Monday AM on Dr. Maisie Fus schedule.  He is aware of the platelet issue and will check on this status likely on Sunday and Monday morning as I will be out of town.  If there is a change in her physical status, please page him.  I will try to speak with the family today.    Samanyu Tinnell T 06/11/2015, 1:32 PM

## 2015-06-11 NOTE — Progress Notes (Signed)
[place] at [place] NAME: Emily Pratt    MR#:  [id number]  DATE OF BIRTH:  [birth year]  SUBJECTIVE:  CHIEF COMPLAINT: Pt is awake and alert, extubated 11. 12, placed on BIPAP, was in significant discomfort , respiratory distress requiring initiation of Precedex due to agitation, intermittently on BiPAP and HFNC yesterday, very short of breath earlier today and was reintubated. Agitated today, struggling to breathe. Moving upper extremities, restless, uncomfortable.  REVIEW OF SYSTEMS:   Limited ROS.  No fever or chills. No chest pain, palpitation, SOB. No nausea, vomiting.  DRUG ALLERGIES:   Allergies  Allergen Reactions  . Atorvastatin Other (See Comments)    Does not remember why she had Intolerance to Lipitor.  . Rosuvastatin Anxiety    Chest tigtness and feeling as if she had heartburn.    VITALS:  Blood pressure 99/46, pulse 88, temperature 98.9 F (37.2 C), temperature source Oral, resp. rate 15, height '5\' 7"'$  (1.702 m), weight 116.7 kg (257 lb 4.4 oz), SpO2 91 %.  PHYSICAL EXAMINATION:  GENERAL:  69 y.o.-year-old patient lying in the bed, somnolent, intubated , dyspneic  , ETT in place, struggling to breathe, flailing arms,  Fighting, poorly coherent. Does not follow commands EYES: Pupils equal, round, reactive to light and accommodation. No scleral icterus.  HEENT: Head atraumatic, normocephalic. ETT in place NECK:  Supple, no jugular venous distention. No thyroid enlargement, no tenderness.  LUNGS: Markedly diminished air entrance on the right, especially in the right lower lobe , rhonchi bilaterally, more on R, good air entrance on the left, some rhonchi on the left as well  CARDIOVASCULAR: S1, S2 normal. No murmurs, rubs, or gallops.  ABDOMEN: Soft, minimally tender diffusely, distended. Bowel sounds present. No organomegaly or mass.  EXTREMITIES: +1 pedal edema, no cyanosis, or clubbing.  NEUROLOGIC: CN 2-12  unremarkable, strength , moves all extremities,  unable to follow commands PSYCHIATRIC: Somnolent, intubated, sedated  SKIN: Sacral decubitus ulcer  Rectal pouch has liquid stool, light color urine in Foley   LABORATORY PANEL:   CBC  Recent Labs Lab 06/11/15 0502  WBC 21.1*  HGB 7.9*  HCT 25.2*  PLT 119*   ------------------------------------------------------------------------------------------------------------------  Chemistries   Recent Labs Lab 06/08/15 0517  06/10/15 0511  06/11/15 0502  NA 144  < > 148*  < > 141  K 3.9  < > 3.9  --  3.6  CL 108  < > 108  --  104  CO2 28  < > 33*  --  33*  GLUCOSE 126*  < > 128*  --  167*  BUN 16  < > 11  --  14  CREATININE 0.73  < > 0.65  --  0.83  CALCIUM 8.4*  < > 8.4*  --  8.3*  MG 1.7  < > 1.6*  --   --   AST 22  --   --   --   --   ALT 45  --   --   --   --   ALKPHOS 113  --   --   --   --   BILITOT 1.0  --   --   --   --   < > = values in this interval not displayed. ------------------------------------------------------------------------------------------------------------------  Cardiac Enzymes No results for input(s): TROPONINI in the last 168 hours. ------------------------------------------------------------------------------------------------------------------  RADIOLOGY:  No results found.  EKG:   Orders placed or performed during the hospital encounter of  05/08/2015  . EKG 12-Lead  . EKG 12-Lead    ASSESSMENT AND PLAN:   1. Acute on chronic respiratory failure with hypoxia and Hypercapnia,  - Multifactorial due to large right-sided pleural effusion/ CHF/ COPD / mass- SCLC - Pulmonology and oncology is following, extubated  November 12 th, 2016, was  intermittently on BiPAP and high flow nasal cannulas, reintubated 11.14.16 by DR Mortimer Fries . Discussed this patient's sister yesterday. - Continue nebulizer, steroids, off  antibiotics. Oncology gave chemotherapy October 31st 2016 with cisplatin and etoposide with  some shrinking of the mass radiologically and clinically. DR Tami Ribas to perform tracheostomy next week likely as long as platelet count is improving. Chest X-ray 06/09/2015 shows improvement in aeration. Getting chest x-ray now since patient's right-sided air entrance has worsened, concerning for mucous plugging versus other etiology. Patient will be sedated, pulmonary following   2. Acute Congestive heart failure: Mixed systolic and diastolic  - Echo 56/38 shows ejection fraction 30 -35% with abnormal filling - Continue metoprolol per cardiology's recommendations , resumed losartan. Given  Lasix . The before y-day, may need to continue depending on blood pressure readings   3. Acute on chronic kidney injury:  - Due to ATN, hypovolemia and hypotension, resolved. - Nephrology signed off. Patient was restarted on losartan, and now is going to be started on Lasix intravenously, stable creatinine , follow tomorrow morning.  Of IVF. Kidney function remains stable   4. Essential hypertension, now hypotensive, likely due to medications, intravascular depletion:  continue metoprolol and losartan, norvasc, initially poor blood pressure control, likely due to stress and recently reintubation, continue current therapy, watching patient's blood pressure readings closely. Discontinue Norvasc. If hypotensive, which happened after sedating medication initiation  5. Diabetes mellitus type 2: controlled. Holding home  medications Off Levemir , now just on sliding scale, blood glucose is controlled. Now back on tube feeds . Patient was hypoglycemic recently, blood glucose levels are ranging between 100-150 over the past 24 hours  6.  Small cell lung cancer, likely stage IV per oncology - Status post bronchoscopy 10/25, pathology show small cell lung cancer - Long-term history of heavy smoking - pathology reports were discussed with the patient and sister in the past.   - Oncology initiated chemotherapy ,  PICC  line for chemotherapy is placed 05/26/2015-chest [place]: moderate right side pleural effusion, which is not seen on chest x-ray or CT scan of the chest.  CT chest: Revealed Interval response to therapy,  improved aeration to the right upper lobe,  decrease in size of central obstructing right perihilar lesion,  supraclavicular Adenopathy. Consulted ENT to place TT , discussed with Dr. Tami Ribas who in turn discussed with Dr. Grayland Ormond, made decision about tracheostomy tube placement, likely next week depending on platelet count and overall patient's condition    7. Postobstructive pneumonia versus atelectasis - Blood cultures negative to date, respiratory, BAL, AFB normal flora - Discontinued vancomycin and Zosyn, completed 7 day Levaquin course  8. Arrhythmia, SVT versus paroxysmal atrial fibrillation during bronchoscopy, now in sinus rhythm, continue metoprolol per cardiology's recommendations, no arrhythmias noted  9. Pancytopenia  following closely, may need transfusions with chemotherapy for lung ca. Improved overall, appreciate  Oncologist input, patienet is off Neupogen now, white blood cell count has improved. No platelet transfusion at this time per oncology, as above 119,000 today. . F/u CBC closely   10. Hypernatremia, resolved with advance tube water influx, stable Na today  11. UTI, Ur cx showed yeast,  of Rocephin  IV, off Diflucan  12. Ileus, following . X-ray revealed increased gas filled loops of the large and small bowel, unchanged from before  13 Hypokalemia. KCl. Resolved  14.  Hypomagnesemia.  Mag iv. Follow level intermittently     CODE STATUS: Full code  TOTAL CRITICAL CARE TIME TAKING CARE OF THIS PATIENT: 45 minutes.    Theodoro Grist M.D on 06/11/2015 at 1:26 PM  Between 7am to 6pm - Pager - [phone] After 6pm go to [email or web address] - password [email or web address] [facility]  Office  [phone]  CC: Primary care physician; Dagoberto Ligas, MD

## 2015-06-11 NOTE — Progress Notes (Signed)
PT Cancellation Note  Patient Details Name: Emily Pratt MRN: [id number] DOB: [birth year]   Cancelled Treatment:    Reason Eval/Treat Not Completed: Medical issues which prohibited therapy. PT reviewed patient's chart and per MD note patient continues to be short of breath and there was mention of sedating patient. PT noted most recent blood pressure for patient at 89/39. Given her clinical picture, PT will defer re-evaluation mobility attempt until patient is more stable and alert to participate in PT services. PT will continue to follow and monitor patient progress.   Kerman Passey, PT, DPT    06/11/2015, 3:14 PM

## 2015-06-11 NOTE — Consult Note (Signed)
ENT input noted. Tentative plans of trach placement on Monday, November 21. This was also supposed to be cycle 2, day 1 of her chemotherapy using cisplatin and etoposide. Patient had a good response to her first cycle of chemotherapy, therefore for patient safety issues after her procedure will consider delaying treatment until later in the week or possibly one full week and give cycle 2 on June 23, 2015.  Will follow.

## 2015-06-12 ENCOUNTER — Inpatient Hospital Stay: Payer: Medicare Other

## 2015-06-12 LAB — CBC
HCT: 21.6 % — ABNORMAL LOW (ref 35.0–47.0)
HEMOGLOBIN: 6.9 g/dL — AB (ref 12.0–16.0)
MCH: 28.6 pg (ref 26.0–34.0)
MCHC: 31.7 g/dL — AB (ref 32.0–36.0)
MCV: 90.3 fL (ref 80.0–100.0)
PLATELETS: 110 10*3/uL — AB (ref 150–440)
RBC: 2.4 MIL/uL — ABNORMAL LOW (ref 3.80–5.20)
RDW: 18.1 % — AB (ref 11.5–14.5)
WBC: 14 10*3/uL — AB (ref 3.6–11.0)

## 2015-06-12 LAB — GLUCOSE, CAPILLARY
Glucose-Capillary: 130 mg/dL — ABNORMAL HIGH (ref 65–99)
Glucose-Capillary: 194 mg/dL — ABNORMAL HIGH (ref 65–99)
Glucose-Capillary: 170 mg/dL — ABNORMAL HIGH (ref 65–99)
Glucose-Capillary: 202 mg/dL — ABNORMAL HIGH (ref 65–99)
Glucose-Capillary: 180 mg/dL — ABNORMAL HIGH (ref 65–99)

## 2015-06-12 LAB — PREPARE RBC (CROSSMATCH)

## 2015-06-12 LAB — EXPECTORATED SPUTUM ASSESSMENT W REFEX TO RESP CULTURE

## 2015-06-12 LAB — MAGNESIUM: Magnesium: 1.6 mg/dL — ABNORMAL LOW (ref 1.7–2.4)

## 2015-06-12 LAB — ABO/RH: ABO/RH(D): O POS

## 2015-06-12 LAB — PHOSPHORUS: PHOSPHORUS: 3.2 mg/dL (ref 2.5–4.6)

## 2015-06-12 MED ORDER — MAGNESIUM SULFATE 2 GM/50ML IV SOLN
2.0000 g | Freq: Once | INTRAVENOUS | Status: AC
  Administered 2015-06-12: 2 g via INTRAVENOUS
  Filled 2015-06-12: qty 50

## 2015-06-12 MED ORDER — SODIUM CHLORIDE 0.9 % IV SOLN
Freq: Once | INTRAVENOUS | Status: AC
  Administered 2015-06-12: 16:00:00 via INTRAVENOUS

## 2015-06-12 MED ORDER — SENNOSIDES-DOCUSATE SODIUM 8.6-50 MG PO TABS
2.0000 | ORAL_TABLET | Freq: Two times a day (BID) | ORAL | Status: DC
  Administered 2015-06-12 – 2015-06-13 (×2): 2 via ORAL
  Filled 2015-06-12 (×2): qty 2

## 2015-06-12 MED ORDER — POTASSIUM CHLORIDE 20 MEQ PO PACK
40.0000 meq | PACK | Freq: Once | ORAL | Status: AC
  Administered 2015-06-12: 40 meq via ORAL
  Filled 2015-06-12: qty 2

## 2015-06-12 MED ORDER — FUROSEMIDE 10 MG/ML IJ SOLN
20.0000 mg | Freq: Once | INTRAMUSCULAR | Status: AC
  Administered 2015-06-12: 20 mg via INTRAVENOUS
  Filled 2015-06-12: qty 2

## 2015-06-12 NOTE — Progress Notes (Signed)
ELECTROLYTE CONSULT NOTE - INITIAL  Pharmacy Consult for electrolyte monitoring  Allergies  Allergen Reactions  . Atorvastatin Other (See Comments)    Does not remember why she had Intolerance to Lipitor.  . Rosuvastatin Anxiety    Chest tigtness and feeling as if she had heartburn.    Patient Measurements: Height: '5\' 7"'$  (170.2 cm) Weight: 256 lb 13.4 oz (116.5 kg) IBW/kg (Calculated) : 61.6   Vital Signs: Temp: 98.6 F (37 C) (11/17 1200) Temp Source: Oral (11/17 1200) BP: 129/47 mmHg (11/17 1300) Pulse Rate: 83 (11/17 1300) Intake/Output from previous day: 11/16 0701 - 11/17 0700 In: 3465.6 [I.V.:2059.1; NG/GT:1256.5; IV Piggyback:150] Out: 400 [Urine:400] Intake/Output from this shift: Total I/O In: 1605.7 [I.V.:944.7; NG/GT:661] Out: 250 [Urine:250]  Labs:  Recent Labs  06/10/15 0511 06/11/15 0502 06/12/15 0437  WBC 9.6 21.1* 14.0*  HGB 7.8* 7.9* 6.9*  HCT 23.9* 25.2* 21.6*  PLT 77* 119* 110*     Recent Labs  06/10/15 0511 06/10/15 0950 06/10/15 1500 06/11/15 0502 06/12/15 1155  NA 148*  --  141 141  --   K 3.9  --   --  3.6  --   CL 108  --   --  104  --   CO2 33*  --   --  33*  --   GLUCOSE 128*  --   --  167*  --   BUN 11  --   --  14  --   CREATININE 0.65  --   --  0.83  --   CALCIUM 8.4*  --   --  8.3*  --   MG 1.6*  --   --   --  1.6*  PHOS  --   --   --   --  3.2  TRIG  --  2205*  --  301*  --    Estimated Creatinine Clearance: 86.8 mL/min (by C-G formula based on Cr of 0.83).    Recent Labs  06/12/15 0340 06/12/15 0731 06/12/15 1156  GLUCAP 130* 194* 170*    Medical History: Past Medical History  Diagnosis Date  . Anxiety   . Arthritis   . COPD (chronic obstructive pulmonary disease) ([place])   . CHF (congestive heart failure) ([place])   . Hyperlipidemia   . Hypertension   . Diabetes mellitus without complication ([place])   . Chronic kidney disease   . Neuromuscular disorder [facility])     Assessment: 69 year old female with  SCLC and currently intubated. Pharmacy consulted for monitoring and managing electrolytes.  Mg: 1.6 Phos: 3.2  Plan: Will give 2gm of magnesium. Will order labs for AM. Pharmacy will continue to monitor and adjust as needed.   Nancy Fetter, PharmD Pharmacy Resident

## 2015-06-12 NOTE — Progress Notes (Signed)
[facility] [place] NAME: Emily Pratt    MR#:  [id number]  DATE OF BIRTH:  [birth year]  SUBJECTIVE:  CHIEF COMPLAINT: Pt is awake and alert, extubated 11. 12, placed on BIPAP, now reintubated on full mechanical ventilation, 65% FiO2. Sedated no review of systems. Anemic, transfusion of packed blood cells was planned. Consent to transfuse blood discussing risks as well as benefits was taken from Mr. Ainley, patient's husband.  REVIEW OF SYSTEMS:   Limited ROS.  No fever or chills. No chest pain, palpitation, SOB. No nausea, vomiting.  DRUG ALLERGIES:   Allergies  Allergen Reactions  . Atorvastatin Other (See Comments)    Does not remember why she had Intolerance to Lipitor.  . Rosuvastatin Anxiety    Chest tigtness and feeling as if she had heartburn.    VITALS:  Blood pressure 93/33, pulse 91, temperature 98.9 F (37.2 C), temperature source Oral, resp. rate 13, height '5\' 7"'$  (1.702 m), weight 116.5 kg (256 lb 13.4 oz), SpO2 95 %.  PHYSICAL EXAMINATION:  GENERAL:  69 y.o.-year-old patient lying in the bed, somnolent, intubated , dyspneic  , ETT in place, struggling to breathe, flailing arms,  Fighting, poorly coherent. Does not follow commands EYES: Pupils equal, round, reactive to light and accommodation. No scleral icterus.  HEENT: Head atraumatic, normocephalic. ETT in place NECK:  Supple, no jugular venous distention. No thyroid enlargement, no tenderness.  LUNGS: Air entrance on the right, especially in the right lower lobe , relatively good air entrance on the left, some rhonchi on the , dull percussion on the right  CARDIOVASCULAR: S1, S2 normal. No murmurs, rubs, or gallops.  ABDOMEN: Soft, minimally tender diffusely, distended. Bowel sounds present. No organomegaly or mass.  EXTREMITIES: +1 pedal edema, no cyanosis, or clubbing.  NEUROLOGIC: CN 2-12 unremarkable, strength , moves all extremities,  unable to follow  commands PSYCHIATRIC: Somnolent, intubated, sedated  SKIN: Sacral decubitus ulcer  Rectal pouch has liquid stool, light color urine in Foley   LABORATORY PANEL:   CBC  Recent Labs Lab 06/12/15 0437  WBC 14.0*  HGB 6.9*  HCT 21.6*  PLT 110*   ------------------------------------------------------------------------------------------------------------------  Chemistries   Recent Labs Lab 06/08/15 0517  06/10/15 0511  06/11/15 0502  NA 144  < > 148*  < > 141  K 3.9  < > 3.9  --  3.6  CL 108  < > 108  --  104  CO2 28  < > 33*  --  33*  GLUCOSE 126*  < > 128*  --  167*  BUN 16  < > 11  --  14  CREATININE 0.73  < > 0.65  --  0.83  CALCIUM 8.4*  < > 8.4*  --  8.3*  MG 1.7  < > 1.6*  --   --   AST 22  --   --   --   --   ALT 45  --   --   --   --   ALKPHOS 113  --   --   --   --   BILITOT 1.0  --   --   --   --   < > = values in this interval not displayed. ------------------------------------------------------------------------------------------------------------------  Cardiac Enzymes No results for input(s): TROPONINI in the last 168 hours. ------------------------------------------------------------------------------------------------------------------  RADIOLOGY:  Dg Chest Port 1 View  06/12/2015  CLINICAL DATA:  Short of breath. EXAM: PORTABLE CHEST 1 VIEW COMPARISON:  06/09/2015 FINDINGS: Endotracheal tube remains in good position. Right arm PICC tip in the proximal SVC unchanged. NG tube in place with the tip not visualized Complete opacification of the right chest which has progressed since the prior study. This is likely due to pleural effusion and collapse. Underlying pneumonia or tumor not excluded. Cardiac enlargement. Vascular congestion and on the left without pulmonary edema or effusion on the left. IMPRESSION: Endotracheal tube remains in good position Complete opacification of the right chest due to enlarging effusion and collapse of the right lung.  Electronically Signed   By: Franchot Gallo M.D.   On: 06/12/2015 07:44    EKG:   Orders placed or performed during the hospital encounter of 05/04/2015  . EKG 12-Lead  . EKG 12-Lead    ASSESSMENT AND PLAN:   1. Acute on chronic respiratory failure with hypoxia and Hypercapnia,  - Multifactorial due to large right-sided pleural effusion/ CHF/ COPD / mass- SCLC - Pulmonology and oncology is following, extubated  November 12 th, 2016,  reintubated 11.14.16 by DR Mortimer Fries . Oncology gave chemotherapy October 31st 2016 with cisplatin and etoposide with some shrinking of the mass radiologically and clinically. DR Tami Ribas to planning to perform tracheostomy November 17 , as platelet count remains relatively stable. Chest X-ray 06/09/2015 showed improvement in aeration. However, most recent chest x-ray reveals complete obliteration of right chest again due to pleural effusion and worsening atelectasis, questionable pneumonia. Scant ET tube suctioning, per nursing staff. Whether cell count was high yesterday, however, decreased to 14,000 by today, says Neupogen was stopped   2. Acute Congestive heart failure: Mixed systolic and diastolic  - Echo 27/78 shows ejection fraction 30 -35% with abnormal filling - Unable to continue metoprolol due to hypotension. As patient looks fluid overloaded, will give her 1 dose of intravenous Lasix . Following blood pressure readings as well as kidney function and urinary output closely  3. Acute on chronic kidney injury:  - Due to ATN, hypovolemia and hypotension, resolved. - Nephrology signed off. Patient was restarted on losartan, now off due to hypotension , stopping metoprolol due to hypotension as well , transfusing patient with packed red blood cells and given 1 dose of Lasix intravenously,  follow creatinine tomorrow morning.  Of IVF. Kidney function remains stable   4. Essential hypertension, now hypotensive, likely due to medications, rule out infection , now off  metoprolol and losartan, norvasc, transfusing packed red blood cells and following blood pressure readings closely, panculture, including sputum, urine.   5. Diabetes mellitus type 2: controlled. Holding home  medications  Now back on tube feeds . Patient was hypoglycemic recently, blood glucose levels are ranging between 100-150 over the past 24 hours, now just on Accu-Cheks  6.  Small cell lung cancer, likely stage IV per oncology - Status post bronchoscopy 10/25, pathology show small cell lung cancer - Long-term history of heavy smoking - pathology reports were discussed with the patient and sister in the past.   - Oncology initiated chemotherapy ,  PICC line for chemotherapy is placed 05/26/2015-chest [place]: moderate right side pleural effusion, which is not seen on chest x-ray or CT scan of the chest.  CT chest: Revealed Interval response to therapy,  improved aeration to the right upper lobe,  decrease in size of central obstructing right perihilar lesion,  supraclavicular Adenopathy. Consulted ENT to place TT ,  Dr. Tami Ribas , we'll proceed to place it on Monday, November 17     7. Postobstructive pneumonia versus  atelectasis - Blood cultures negative to date, respiratory, BAL, AFB normal flora - Discontinued vancomycin and Zosyn, completed 7 day Levaquin course in the recent past . Reculture sputum,  8. Arrhythmia, SVT versus paroxysmal atrial fibrillation during bronchoscopy, now in sinus rhythm,  unable to continue metoprololdue to hypotension, resume it as soon as possible ,  no arrhythmias noted  9. Pancytopenia  following closely, may need transfusions with chemotherapy for lung ca. Improved overall, appreciate  Oncologist input, patienet is off Neupogen now, white blood cell count has improved. No platelet transfusion at this time per oncology, as above 110,000 today. .  patient is severely anemic. We will transfuse packed red blood cells today, risks as well as benefits were  discussed with patient's husband , who was agreeable for transfusion , F/u CBC closely   10. Hypernatremia, resolved with advance tube water influx, stable Na today  11. UTI, Ur cx showed yeast,  of Rocephin IV, off Diflucan  12. Ileus, following . X-ray revealed increased gas filled loops of the large and small bowel, unchanged from before, now on tube feeds. Rectal tube is discontinued  13 Hypokalemia. KCl. Resolved  14.  Hypomagnesemia.  Mag iv. Follow level intermittently     CODE STATUS: Full code  TOTAL CRITICAL CARE TIME TAKING CARE OF THIS PATIENT:60. Minutes   Coordination of care time 30 minutes. 4. Discussions with a the patient's sister, husband , all questions were answered  Chen Holzman M.D on 06/12/2015 at 12:07 PM  Between 7am to 6pm - Pager - [phone] After 6pm go to [email or web address] - password [email or web address] [facility]  Office  [phone]  CC: Primary care physician; Dagoberto Ligas, MD

## 2015-06-12 NOTE — Progress Notes (Signed)
MEDICATION RELATED CONSULT NOTE - Follow Up  Pharmacy Consult for Constipation Prevention   Allergies  Allergen Reactions  . Atorvastatin Other (See Comments)    Does not remember why she had Intolerance to Lipitor.  . Rosuvastatin Anxiety    Chest tigtness and feeling as if she had heartburn.    Patient Measurements: Height: '5\' 7"'$  (170.2 cm) Weight: 256 lb 13.4 oz (116.5 kg) IBW/kg (Calculated) : 61.6   Vital Signs: Temp: 98.6 F (37 C) (11/17 1200) Temp Source: Oral (11/17 1200) BP: 129/47 mmHg (11/17 1300) Pulse Rate: 83 (11/17 1300) Intake/Output from previous day: 11/16 0701 - 11/17 0700 In: 3465.6 [I.V.:2059.1; NG/GT:1256.5; IV Piggyback:150] Out: 400 [Urine:400] Intake/Output from this shift: Total I/O In: 1605.7 [I.V.:944.7; NG/GT:661] Out: 250 [Urine:250]  Labs:  Recent Labs  06/10/15 0511 06/11/15 0502 06/12/15 0437 06/12/15 1155  WBC 9.6 21.1* 14.0*  --   HGB 7.8* 7.9* 6.9*  --   HCT 23.9* 25.2* 21.6*  --   PLT 77* 119* 110*  --   CREATININE 0.65 0.83  --   --   MG 1.6*  --   --  1.6*  PHOS  --   --   --  3.2   Estimated Creatinine Clearance: 86.8 mL/min (by C-G formula based on Cr of 0.83).   Microbiology: Recent Results (from the past 720 hour(s))  MRSA PCR Screening     Status: None   Collection Time: 05/09/2015 12:28 AM  Result Value Ref Range Status   MRSA by PCR NEGATIVE NEGATIVE Final    Comment:        The GeneXpert MRSA Assay (FDA approved for NASAL specimens only), is one component of a comprehensive MRSA colonization surveillance program. It is not intended to diagnose MRSA infection nor to guide or monitor treatment for MRSA infections.   Culture, blood (routine x 2)     Status: None   Collection Time: 04/28/2015  1:13 AM  Result Value Ref Range Status   Specimen Description BLOOD RIGHT HAND  Final   Special Requests BOTTLES DRAWN AEROBIC AND ANAEROBIC 3CC  Final   Culture NO GROWTH 5 DAYS  Final   Report Status 05/24/2015  FINAL  Final  Culture, blood (routine x 2)     Status: None   Collection Time: 05/02/2015  1:13 AM  Result Value Ref Range Status   Specimen Description BLOOD RIGHT ASSIST CONTROL  Final   Special Requests BOTTLES DRAWN AEROBIC AND ANAEROBIC 4CC  Final   Culture NO GROWTH 5 DAYS  Final   Report Status 05/24/2015 FINAL  Final  Culture, expectorated sputum-assessment     Status: None   Collection Time: 05/20/15  2:00 PM  Result Value Ref Range Status   Specimen Description EXPECTORATED SPUTUM  Final   Special Requests NONE  Final   Sputum evaluation THIS SPECIMEN IS ACCEPTABLE FOR SPUTUM CULTURE  Final   Report Status 05/20/2015 FINAL  Final  Culture, respiratory (NON-Expectorated)     Status: None   Collection Time: 05/20/15  2:00 PM  Result Value Ref Range Status   Specimen Description EXPECTORATED SPUTUM  Final   Special Requests NONE Reflexed from [id number]  Final   Gram Stain   Final    FEW WBC SEEN FEW GRAM POSITIVE RODS FAIR SPECIMEN - 70-80% WBCS    Culture APPEARS TO BE NORMAL FLORA  Final   Report Status 05/22/2015 FINAL  Final  Culture, bal-quantitative     Status: None   Collection Time:  05/20/15  5:05 PM  Result Value Ref Range Status   Specimen Description BRONCHIAL ALVEOLAR LAVAGE  Final   Special Requests Normal  Final   Gram Stain   Final    MODERATE WBC SEEN RARE GRAM POSITIVE RODS RARE GRAM POSITIVE COCCI IN CHAINS GOOD SPECIMEN - 80-90% WBCS    Culture APPEARS TO BE NORMAL FLORA  Final   Report Status 05/22/2015 FINAL  Final  Fungus Culture with Smear     Status: None   Collection Time: 05/20/15  5:05 PM  Result Value Ref Range Status   Specimen Description SPUTUM  Final   Special Requests Normal  Final   Culture CANDIDA DUBLINIENSIS  Final   Report Status 06/10/2015 FINAL  Final  C difficile quick scan w PCR reflex     Status: None   Collection Time: 05/29/15  4:50 PM  Result Value Ref Range Status   C Diff antigen NEGATIVE NEGATIVE Final   C Diff toxin  NEGATIVE NEGATIVE Final   C Diff interpretation Negative for C. difficile  Final  Urine culture     Status: None   Collection Time: 05/29/15  6:19 PM  Result Value Ref Range Status   Specimen Description URINE, RANDOM  Final   Special Requests NONE  Final   Culture >=100,000 COLONIES/mL CANDIDA ALBICANS  Final   Report Status 06/04/2015 FINAL  Final    Medical History: Past Medical History  Diagnosis Date  . Anxiety   . Arthritis   . COPD (chronic obstructive pulmonary disease) ([place])   . CHF (congestive heart failure) ([place])   . Hyperlipidemia   . Hypertension   . Diabetes mellitus without complication ([place])   . Chronic kidney disease   . Neuromuscular disorder ([place])     Medications:  Scheduled:  . sodium chloride   Intravenous Once  . antiseptic oral rinse  7 mL Mouth Rinse 10 times per day  . budesonide (PULMICORT) nebulizer solution  0.5 mg Nebulization BID  . chlorhexidine gluconate  15 mL Mouth Rinse BID  . famotidine (PEPCID) IV  20 mg Intravenous Q12H  . feeding supplement (PRO-STAT SUGAR FREE 64)  30 mL Per Tube Daily  . feeding supplement (VITAL HIGH PROTEIN)  1,000 mL Per Tube Q24H  . free water  200 mL Per Tube BID  . furosemide  20 mg Intravenous Once  . ipratropium-albuterol  3 mL Nebulization Q6H  . magnesium sulfate 1 - 4 g bolus IVPB  2 g Intravenous Once  . senna-docusate  2 tablet Oral BID   Infusions:  . fentaNYL infusion INTRAVENOUS 25 mcg/hr (06/12/15 0650)  . norepinephrine 14 mcg/min (06/12/15 1421)    Assessment: Pharmacy consulted to assist in constipation prevention in this 69 y/o F with multiple medical problems who is currently intubated and sedated.  Patient stated on senna-docusate BID  On 11/16.  Plan:  No BM in past 24 hours. Will adjust senna/docusate to 2 tablets BID. Will continue to monitor patients and make adjustments to bowel regimen as needed.   Nancy Fetter, PharmD Pharmacy Resident

## 2015-06-12 NOTE — Care Management (Signed)
Patient to have trach on 11/21.  Will attempt weaning after trach.  Currently on sedation and levophed

## 2015-06-12 NOTE — Progress Notes (Signed)
Blood transfusion started post consent verification and VS checked. Stayed with pt 1st 15 minutes, no ss of negative reactions noted, blood rate increased to 157m/hr. VS checked every hour as charted, pt checked every hour for any negative reactions.

## 2015-06-12 NOTE — Clinical Social Work Note (Signed)
Patient's discharge plan has been complicated by the fact that patient is vented and also needing to receive chemo. Patient currently not able to go to LTAC due to transportation concerns regarding getting patient to and from outpatient visit for her chemo sessions. The same holds true for a nursing home facility. The only local nursing home that could manage a vented patient is [facility] (not to be confused with [facility]) and they would have the same barriers as the LTAC regarding the delicate nature of transporting a vented patient to and from a chemo appointment. Shela Leff MSW,LCSW [phone]

## 2015-06-12 NOTE — Progress Notes (Signed)
CC: follow up resp failure HPI remains intubated, sedated, critically ill Re-intubated 11/14-now on full vet support, fio2 65% Will need trach-ENT consulted plt count 119>>110 Plan for trach on Monday  Filed Vitals:   06/12/15 0630 06/12/15 0700 06/12/15 0730 06/12/15 0800  BP: 101/35 105/35 110/46 96/51  Pulse: 92 92 92 96  Temp:    98.9 F (37.2 C)  TempSrc:    Oral  Resp: '13 15 14 14  '$ Height:      Weight:      SpO2: 94% 94% 94% 94%   Obese, cognition intact, RASS 0 HEENT WNL JVP cannot be visualized Very coarse BS R>L, no wheezes Reg, no M Abd obese, soft, +BS Trace symmetric LE edema No focal neuro deficits, gcs 10T  BMP Latest Ref Rng 06/11/2015 06/10/2015 06/10/2015  Glucose 65 - 99 mg/dL 167(H) - 128(H)  BUN 6 - 20 mg/dL 14 - 11  Creatinine 0.44 - 1.00 mg/dL 0.83 - 0.65  Sodium 135 - 145 mmol/L 141 141 148(H)  Potassium 3.5 - 5.1 mmol/L 3.6 - 3.9  Chloride 101 - 111 mmol/L 104 - 108  CO2 22 - 32 mmol/L 33(H) - 33(H)  Calcium 8.9 - 10.3 mg/dL 8.3(L) - 8.4(L)    CBC Latest Ref Rng 06/12/2015 06/11/2015 06/10/2015  WBC 3.6 - 11.0 K/uL 14.0(H) 21.1(H) 9.6  Hemoglobin 12.0 - 16.0 g/dL 6.9(L) 7.9(L) 7.8(L)  Hematocrit 35.0 - 47.0 % 21.6(L) 25.2(L) 23.9(L)  Platelets 150 - 440 K/uL 110(L) 119(L) 77(L)     IMPRESSION: Acute respiratory failure with hypoxemia from acute COPD exacerbation with acute Tx for Small cell lung cancer New diagnosis of small cell ca - s/p chemo, XRT  PLAN/REC: 1.Respiratory Failure -continue Full MV support -continue Bronchodilator Therapy -Wean Fio2 and PEEP as tolerated Will need trach for survival-planned for Monday Cont empiric nebulized bronchodilators-will reconsider adding steroids  2.Lung mass On chemo therapy -next cycle next week  3.COPD-continue bd therapy and streroids     I have personally obtained a history, examined the patient, evaluated Pertinent laboratory and RadioGraphic/imaging results, and  formulated the  assessment and plan  The Patient requires high complexity decision making for assessment and support, frequent evaluation and titration of therapies, application of advanced monitoring technologies and extensive interpretation of multiple databases. Critical Care Time devoted to patient care services described in this note is 40 minutes.   Overall, patient is critically ill, prognosis is guarded.  Patient with Multiorgan failure and at high risk for cardiac arrest and death.    Corrin Parker, M.D.  [facility]  Medical Director [place] Director [facility] Cardio-Pulmonary Department

## 2015-06-13 LAB — BASIC METABOLIC PANEL
ANION GAP: 5 (ref 5–15)
Anion gap: 5 (ref 5–15)
BUN: 19 mg/dL (ref 6–20)
BUN: 21 mg/dL — ABNORMAL HIGH (ref 6–20)
CALCIUM: 7.5 mg/dL — AB (ref 8.9–10.3)
CALCIUM: 8.4 mg/dL — AB (ref 8.9–10.3)
CHLORIDE: 96 mmol/L — AB (ref 101–111)
CHLORIDE: 101 mmol/L (ref 101–111)
CO2: 32 mmol/L (ref 22–32)
CO2: 35 mmol/L — ABNORMAL HIGH (ref 22–32)
CREATININE: 0.72 mg/dL (ref 0.44–1.00)
CREATININE: 0.69 mg/dL (ref 0.44–1.00)
GFR calc non Af Amer: >60 mL/min (ref >60)
Glucose, Bld: 445 mg/dL — ABNORMAL HIGH (ref 65–99)
Glucose, Bld: 197 mg/dL — ABNORMAL HIGH (ref 65–99)
Potassium: 3.2 mmol/L — ABNORMAL LOW (ref 3.5–5.1)
Potassium: 4.4 mmol/L (ref 3.5–5.1)
SODIUM: 133 mmol/L — AB (ref 135–145)
SODIUM: 141 mmol/L (ref 135–145)

## 2015-06-13 LAB — PROTIME-INR
INR: 1.15
Prothrombin Time: 14.9 seconds (ref 11.4–15.0)

## 2015-06-13 LAB — CBC
HCT: 23.6 % — ABNORMAL LOW (ref 35.0–47.0)
HCT: 24.7 % — ABNORMAL LOW (ref 35.0–47.0)
HEMOGLOBIN: 7.5 g/dL — AB (ref 12.0–16.0)
Hemoglobin: 8.1 g/dL — ABNORMAL LOW (ref 12.0–16.0)
MCH: 29.1 pg (ref 26.0–34.0)
MCH: 29.6 pg (ref 26.0–34.0)
MCHC: 31.9 g/dL — ABNORMAL LOW (ref 32.0–36.0)
MCHC: 32.7 g/dL (ref 32.0–36.0)
MCV: 91.3 fL (ref 80.0–100.0)
MCV: 90.6 fL (ref 80.0–100.0)
PLATELETS: 108 10*3/uL — AB (ref 150–440)
Platelets: 110 10*3/uL — ABNORMAL LOW (ref 150–440)
RBC: 2.58 MIL/uL — AB (ref 3.80–5.20)
RBC: 2.73 MIL/uL — ABNORMAL LOW (ref 3.80–5.20)
RDW: 17.4 % — AB (ref 11.5–14.5)
RDW: 18.1 % — AB (ref 11.5–14.5)
WBC: 11 10*3/uL (ref 3.6–11.0)
WBC: 11.7 10*3/uL — ABNORMAL HIGH (ref 3.6–11.0)

## 2015-06-13 LAB — MAGNESIUM: MAGNESIUM: 1.4 mg/dL — AB (ref 1.7–2.4)

## 2015-06-13 LAB — APTT: aPTT: 33 seconds (ref 24–36)

## 2015-06-13 LAB — GLUCOSE, CAPILLARY
Glucose-Capillary: 167 mg/dL — ABNORMAL HIGH (ref 65–99)
Glucose-Capillary: 169 mg/dL — ABNORMAL HIGH (ref 65–99)
Glucose-Capillary: 177 mg/dL — ABNORMAL HIGH (ref 65–99)
Glucose-Capillary: 182 mg/dL — ABNORMAL HIGH (ref 65–99)
Glucose-Capillary: 184 mg/dL — ABNORMAL HIGH (ref 65–99)
Glucose-Capillary: 194 mg/dL — ABNORMAL HIGH (ref 65–99)
Glucose-Capillary: 195 mg/dL — ABNORMAL HIGH (ref 65–99)

## 2015-06-13 LAB — PHOSPHORUS: Phosphorus: 2.8 mg/dL (ref 2.5–4.6)

## 2015-06-13 MED ORDER — SENNOSIDES-DOCUSATE SODIUM 8.6-50 MG PO TABS
1.0000 | ORAL_TABLET | Freq: Two times a day (BID) | ORAL | Status: DC
  Administered 2015-06-13 – 2015-06-15 (×5): 1 via ORAL
  Filled 2015-06-13 (×5): qty 1

## 2015-06-13 MED ORDER — POTASSIUM CHLORIDE 10 MEQ/50ML IV SOLN
10.0000 meq | INTRAVENOUS | Status: AC
  Administered 2015-06-13 (×4): 10 meq via INTRAVENOUS
  Filled 2015-06-13 (×4): qty 50

## 2015-06-13 MED ORDER — SODIUM CHLORIDE 0.9 % IV SOLN
1.0000 mg/h | INTRAVENOUS | Status: DC
  Administered 2015-06-13 – 2015-06-15 (×4): 2 mg/h via INTRAVENOUS
  Administered 2015-06-16: 4 mg/h via INTRAVENOUS
  Filled 2015-06-13 (×5): qty 10

## 2015-06-13 MED ORDER — MAGNESIUM SULFATE 2 GM/50ML IV SOLN
2.0000 g | Freq: Once | INTRAVENOUS | Status: AC
Start: 2015-06-13 — End: 2015-06-13
  Administered 2015-06-13: 2 g via INTRAVENOUS
  Filled 2015-06-13: qty 50

## 2015-06-13 MED ORDER — STERILE WATER FOR INJECTION IJ SOLN
INTRAMUSCULAR | Status: AC
  Filled 2015-06-13: qty 10

## 2015-06-13 MED ORDER — INSULIN ASPART 100 UNIT/ML ~~LOC~~ SOLN
2.0000 [IU] | SUBCUTANEOUS | Status: DC
  Administered 2015-06-13 – 2015-06-14 (×4): 4 [IU] via SUBCUTANEOUS
  Administered 2015-06-14: 2 [IU] via SUBCUTANEOUS
  Administered 2015-06-14 – 2015-06-15 (×6): 4 [IU] via SUBCUTANEOUS
  Administered 2015-06-15: 6 [IU] via SUBCUTANEOUS
  Administered 2015-06-15 – 2015-06-16 (×4): 4 [IU] via SUBCUTANEOUS
  Administered 2015-06-16: 100 [IU] via SUBCUTANEOUS
  Administered 2015-06-16: 6 [IU] via SUBCUTANEOUS
  Filled 2015-06-13: qty 6
  Filled 2015-06-13 (×6): qty 4
  Filled 2015-06-13: qty 6
  Filled 2015-06-13 (×7): qty 4
  Filled 2015-06-13: qty 2
  Filled 2015-06-13 (×2): qty 4

## 2015-06-13 NOTE — Progress Notes (Signed)
ELECTROLYTE CONSULT NOTE - INITIAL  Pharmacy Consult for electrolyte monitoring  Allergies  Allergen Reactions  . Atorvastatin Other (See Comments)    Does not remember why she had Intolerance to Lipitor.  . Rosuvastatin Anxiety    Chest tigtness and feeling as if she had heartburn.    Patient Measurements: Height: '5\' 7"'$  (170.2 cm) Weight: 257 lb 15 oz (117 kg) IBW/kg (Calculated) : 61.6   Vital Signs: Temp: 98.7 F (37.1 C) (11/18 0800) Temp Source: Oral (11/18 0800) BP: 127/46 mmHg (11/18 1100) Pulse Rate: 86 (11/18 1100) Intake/Output from previous day: 11/17 0701 - 11/18 0700 In: 4478.7 [I.V.:2307.7; Blood:330; NG/GT:1741; IV Piggyback:100] Out: 2400 [Urine:2400] Intake/Output from this shift: Total I/O In: 810.7 [I.V.:180.7; NG/GT:380; IV Piggyback:250] Out: -   Labs:  Recent Labs  06/11/15 0502 06/12/15 0437 06/13/15 0421  WBC 21.1* 14.0* 11.0  HGB 7.9* 6.9* 7.5*  HCT 25.2* 21.6* 23.6*  PLT 119* 110* 110*     Recent Labs  06/10/15 1500 06/11/15 0502 06/12/15 1155 06/13/15 0421  NA 141 141  --  133*  K  --  3.6  --  3.2*  CL  --  104  --  96*  CO2  --  33*  --  32  GLUCOSE  --  167*  --  445*  BUN  --  14  --  19  CREATININE  --  0.83  --  0.72  CALCIUM  --  8.3*  --  7.5*  MG  --   --  1.6*  --   PHOS  --   --  3.2  --   TRIG  --  301*  --   --    Estimated Creatinine Clearance: 90.3 mL/min (by C-G formula based on Cr of 0.72).    Recent Labs  06/13/15 0401 06/13/15 0730 06/13/15 1149  GLUCAP 194* 195* 177*    Medical History: Past Medical History  Diagnosis Date  . Anxiety   . Arthritis   . COPD (chronic obstructive pulmonary disease) ([place])   . CHF (congestive heart failure) ([place])   . Hyperlipidemia   . Hypertension   . Diabetes mellitus without complication ([place])   . Chronic kidney disease   . Neuromuscular disorder [facility])     Assessment: 69 year old female with SCLC and currently intubated. Pharmacy consulted for  monitoring and managing electrolytes.  Phos: 3.2  Plan: Potassium replaced with 4 runs IV this AM. Mag and phos are pending. Will replace as necessary and f/u am labs.   Ulice Dash, PharmD

## 2015-06-13 NOTE — Progress Notes (Signed)
[place]  [phone] [phone]  ID: Emily Pratt OB: Jun 21, 1946  MR#: [id number]  QAS#:[id number]  Patient Care Team: Dagoberto Ligas, MD as PCP - General (Internal Medicine)  CHIEF COMPLAINT:  Chief Complaint  Patient presents with  . Respiratory Distress    INTERVAL HISTORY: Patient is extubated briefly then subsequently reintubated secondary to respiratory distress. She is currently lethargic and difficult to arouse. No family is at bedside.  REVIEW OF SYSTEMS:   Review of Systems  Unable to perform ROS: intubated    PAST MEDICAL HISTORY: Past Medical History  Diagnosis Date  . Anxiety   . Arthritis   . COPD (chronic obstructive pulmonary disease) ([place])   . CHF (congestive heart failure) ([place])   . Hyperlipidemia   . Hypertension   . Diabetes mellitus without complication ([place])   . Chronic kidney disease   . Neuromuscular disorder ([place])     PAST SURGICAL HISTORY: Past Surgical History  Procedure Laterality Date  . Joint replacement Bilateral 2006    both knees  . Ankle surgery      FAMILY HISTORY Family History  Problem Relation Age of Onset  . Diabetes Mother   . Cancer Father   . Diabetes Sister   . Stroke Sister        ADVANCED DIRECTIVES:    HEALTH MAINTENANCE: Social History  Substance Use Topics  . Smoking status: Current Every Day Smoker    Types: Cigarettes  . Smokeless tobacco: None     Comment: 0.5 pack /day  . Alcohol Use: No     Colonoscopy:  PAP:  Bone density:  Lipid panel:  Allergies  Allergen Reactions  . Atorvastatin Other (See Comments)    Does not remember why she had Intolerance to Lipitor.  . Rosuvastatin Anxiety    Chest tigtness and feeling as if she had heartburn.    Current Facility-Administered Medications  Medication Dose Route Frequency Provider Last Rate Last Dose  . acetaminophen (TYLENOL) tablet 650 mg  650 mg Oral Q6H PRN Harrie Foreman, MD   650 mg at  06/06/15 7989   Or  . acetaminophen (TYLENOL) suppository 650 mg  650 mg Rectal Q6H PRN Harrie Foreman, MD      . antiseptic oral rinse solution (CORINZ)  7 mL Mouth Rinse 10 times per day Flora Lipps, MD   7 mL at 06/13/15 1400  . budesonide (PULMICORT) nebulizer solution 0.5 mg  0.5 mg Nebulization BID Flora Lipps, MD   0.5 mg at 06/13/15 0846  . chlorhexidine gluconate (PERIDEX) 0.12 % solution 15 mL  15 mL Mouth Rinse BID Flora Lipps, MD   15 mL at 06/13/15 0801  . famotidine (PEPCID) IVPB 20 mg premix  20 mg Intravenous Q12H Juanito Doom, MD   20 mg at 06/13/15 0934  . feeding supplement (PRO-STAT SUGAR FREE 64) liquid 30 mL  30 mL Per Tube Daily Flora Lipps, MD   30 mL at 06/13/15 0956  . feeding supplement (VITAL HIGH PROTEIN) liquid 1,000 mL  1,000 mL Per Tube Q24H Flora Lipps, MD   1,000 mL at 06/13/15 0404  . fentaNYL 2548mg in NS 2574m(1051mml) infusion-PREMIX  0-400 mcg/hr Intravenous Titrated SteAnders SimmondsD 30 mL/hr at 06/13/15 0939 300 mcg/hr at 06/13/15 0939  . free water 200 mL  200 mL Per Tube BID RimTheodoro GristD   200 mL at 06/13/15 1000  . hydrALAZINE (APRESOLINE) injection 10-20 mg  10-20 mg Intravenous  Q4H PRN Wilhelmina Mcardle, MD      . insulin aspart (novoLOG) injection 2-6 Units  2-6 Units Subcutaneous 6 times per day Flora Lipps, MD   4 Units at 06/13/15 1208  . ipratropium-albuterol (DUONEB) 0.5-2.5 (3) MG/3ML nebulizer solution 3 mL  3 mL Nebulization Q6H Vishal Mungal, MD   3 mL at 06/13/15 1426  . midazolam (VERSED) 50 mg in sodium chloride 0.9 % 50 mL (1 mg/mL) infusion  1-10 mg/hr Intravenous Continuous Flora Lipps, MD 2 mL/hr at 06/13/15 0939 2 mg/hr at 06/13/15 0939  . midazolam (VERSED) injection 2 mg  2 mg Intravenous Q2H PRN Anders Simmonds, MD   2 mg at 06/13/15 0411  . norepinephrine (LEVOPHED) '4mg'$  in D5W 24m premix infusion  0-40 mcg/min Intravenous Titrated KFlora Lipps MD 30 mL/hr at 06/13/15 1045 8 mcg/min at 06/13/15 1045  . phenol  (CHLORASEPTIC) mouth spray 1 spray  1 spray Mouth/Throat PRN DWilhelmina Mcardle MD      . senna-docusate (Senokot-S) tablet 1 tablet  1 tablet Oral BID RTheodoro Grist MD      . sterile water (preservative free) injection             OBJECTIVE: Filed Vitals:   06/13/15 1400  BP: 121/50  Pulse: 89  Temp:   Resp: 9     Body mass index is 40.39 kg/(m^2).    ECOG FS:4 - Bedbound  General:  intubated Eyes: Pink conjunctiva, anicteric sclera. HEENT:  ET tube in place. Lungs: Clear to auscultation bilaterally. Heart: Regular rate and rhythm. No rubs, murmurs, or gallops. Abdomen: Soft, nontender, nondistended. No organomegaly noted, normoactive bowel sounds. Musculoskeletal: No edema, cyanosis, or clubbing. Neuro: Sedated. Skin: No rashes or petechiae noted.   LAB RESULTS:  Lab Results  Component Value Date   NA 133* 06/13/2015   K 3.2* 06/13/2015   CL 96* 06/13/2015   CO2 32 06/13/2015   GLUCOSE 445* 06/13/2015   BUN 19 06/13/2015   CREATININE 0.72 06/13/2015   CALCIUM 7.5* 06/13/2015   PROT 4.8* 06/08/2015   ALBUMIN 2.1* 06/08/2015   AST 22 06/08/2015   ALT 45 06/08/2015   ALKPHOS 113 06/08/2015   BILITOT 1.0 06/08/2015   GFRNONAA >60 06/13/2015   GFRAA >60 06/13/2015    Lab Results  Component Value Date   WBC 11.7* 06/13/2015   NEUTROABS 0.2* 06/03/2015   HGB 8.1* 06/13/2015   HCT 24.7* 06/13/2015   MCV 90.6 06/13/2015   PLT 108* 06/13/2015     STUDIES: Dg Chest 1 View  05/31/2015  CLINICAL DATA:  Dyspnea. EXAM: CHEST 1 VIEW COMPARISON:  05/30/2015 FINDINGS: ET tube tip is above the carina. There is a nasogastric tube in place. Right arm PICC line tip is in the SVC. Loculated right pleural effusion is unchanged from previous exam. Cardiac enlargement and aortic atherosclerosis again noted. IMPRESSION: 1. No change and large loculated right pleural effusion. 2. Cardiac enlargement and aortic atherosclerosis. Electronically Signed   By: TKerby MoorsM.D.   On:  05/31/2015 09:31   Dg Chest 1 View  05/30/2015  CLINICAL DATA:  Shortness of breath. EXAM: CHEST 1 VIEW COMPARISON:  05/29/2015.  CT 05/20/2015 . FINDINGS: Endotracheal tube, NG tube, right PICC line in stable position. Persistent consolidation of the right lung and right pleural effusion again noted . Heart size stable. No pneumothorax. No acute osseous abnormality. IMPRESSION: 1. Lines and tubes in stable position. 2. Persistent consolidation of the right lung with prominent right pleural  effusion. No interim change from prior exam. Electronically Signed   By: Marcello Moores  Register   On: 05/30/2015 07:15   Dg Chest 1 View  05/29/2015  CLINICAL DATA:  Dyspnea, respiratory failure, COPD, lung mass EXAM: CHEST 1 VIEW COMPARISON:  Portable chest x-ray of May 28, 2015 FINDINGS: There is persistent near total atelectasis of the right lung with some aeration noted in the lower hemi thorax. The left lung is clear. There is no significant mediastinal shift. The cardiac silhouette is normal in size. The endotracheal tube tip lies 4.6 cm above the carina. The esophagogastric tube tip projects below the inferior margin of the image. The right-sided PICC line tip projects over the midportion of the SVC. IMPRESSION: Persistent consolidation of the mid and upper right lung with small amount of residual aerated right lower lung. The left lung is clear. The support tubes are in reasonable position. Electronically Signed   By: David  Martinique M.D.   On: 05/29/2015 07:20   Dg Chest 1 View  05/28/2015  CLINICAL DATA:  Hypoxia EXAM: CHEST 1 VIEW COMPARISON:  May 26, 2015 FINDINGS: Endotracheal tube tip is 4.6 cm above the carina. Nasogastric tube tip and side port below the diaphragm. Central catheter tip is in the superior cava. No pneumothorax. There is complete opacification of the right hemithorax with volume loss and probable effusion. There is elevation of the right hemidiaphragm. The left lung is clear. The heart size  is within normal limits. Pulmonary vascularity on the left is within normal limits. Pulmonary vascularity on the right is obscured. There is atherosclerotic change throughout the aorta. IMPRESSION: Tube and catheter positions as described without pneumothorax. Complete opacification of the right hemithorax remains with evidence of volume loss and probable pleural effusion. Left lung clear. No change in cardiac silhouette. Electronically Signed   By: Lowella Grip III M.D.   On: 05/28/2015 07:18   Dg Chest 1 View  05/26/2015  CLINICAL DATA:  Wheezing and shortness of breath EXAM: CHEST 1 VIEW COMPARISON:  Portable chest x-ray of today's date FINDINGS: There is persistent 0 opacification of the mid and upper thirds of the right hemi thorax. Only a small amount of aerated lung is visible in the lower hemi thorax. Pleural fluid on the right is suspected. The cardiac silhouette is mildly enlarged. The left lung is well-expanded. There is no focal infiltrate or pleural effusion on the left. The endotracheal tube tip projects 4.2 cm above the carina. The PICC line tip on the left overlies the junction of the proximal and midportions of the SVC. IMPRESSION: Fairly stable appearance of the chest since the previous study with opacification of the upper 2/3 of the right hemithorax consistent with atelectasis-pneumonia and left pleural effusion. The left lung remains clear. Electronically Signed   By: David  Martinique M.D.   On: 05/26/2015 13:51   Dg Abd 1 View  06/06/2015  CLINICAL DATA:  69 year old female with abdominal distention and diarrhea. Initial encounter. EXAM: ABDOMEN - 1 VIEW COMPARISON:  06/02/2015 and earlier. FINDINGS: 2 Portable AP supine views at 1525 hours. Large body habitus. Improved bowel gas pattern since 05/30/2015, although increased gas-filled small and large bowel loops since 06/02/2015. Still, no dilated small bowel loops present. Large bowel caliber is at the upper limits of normal. Enteric  tube remains in place. The side hole appears to be at the level of the diaphragm. Stable visualized osseous structures. IMPRESSION: 1. Increased gas filled large and small bowel loops since 06/02/2015 most suggestive  of ileus. 2. Enteric tube in place, but side hole at the level of the distal thoracic esophagus. Advance 6 cm to place the side hole within the stomach. Electronically Signed   By: Genevie Ann M.D.   On: 06/06/2015 15:41   Dg Abd 1 View  06/02/2015  CLINICAL DATA:  Status post OG tube placement. EXAM: ABDOMEN - 1 VIEW COMPARISON:  None. FINDINGS: OG tube is in place with the tip in the stomach. The tube could be advanced 3-4 cm for better positioning. IMPRESSION: As above. Electronically Signed   By: Inge Rise M.D.   On: 06/02/2015 15:21   Dg Abd 1 View  06/02/2015  CLINICAL DATA:  Orogastric tube placement. EXAM: ABDOMEN - 1 VIEW COMPARISON:  Earlier today at 0800 hours. FINDINGS: Motion and patient body habitus degradation. Nasogastric tube terminates at the body of the stomach. The side-port may be above the gastroesophageal junction. No gross free intraperitoneal air. Partial right hemi thorax opacification, incompletely imaged. IMPRESSION: Degraded exam, as detailed above. Nasogastric terminating at the body of the stomach. Electronically Signed   By: Abigail Miyamoto M.D.   On: 06/02/2015 09:28   Dg Abd 1 View  06/02/2015  CLINICAL DATA:  NG tube placement . EXAM: ABDOMEN - 1 VIEW COMPARISON:  05/30/2015 . FINDINGS: NG tube tip noted at the level of the gastroesophageal junction. More distal placement should be considered. Persistent bowel distention. No free air . IMPRESSION: NG tube tip noted at the level of the gastroesophageal junction. More distal placement should be considered.These results will be called to the ordering clinician or representative by the Radiologist Assistant, and communication documented in the PACS or zVision Dashboard. Electronically Signed   By: Marcello Moores  Register    On: 06/02/2015 08:19   Dg Abd 1 View  05/30/2015  CLINICAL DATA:  Lung cancer.  Distended abdomen. EXAM: ABDOMEN - 1 VIEW COMPARISON:  05/20/2015 FINDINGS: Mild gaseous distention of the stomach and transverse colon. No small bowel dilatation to suggest obstruction. No organomegaly. No free air. IMPRESSION: Mild gaseous distention of the stomach and transverse colon. This may reflect mild ileus. Electronically Signed   By: Rolm Baptise M.D.   On: 05/30/2015 16:42   Ct Chest Wo Contrast  05/20/2015  CLINICAL DATA:  69 year old who presented with acute hypercapnic respiratory failure, current history of COPD and CHF, stage 3 chronic kidney disease, with an enlarging right pleural effusion and right paratracheal soft tissue on chest x-ray yesterday. EXAM: CT CHEST WITHOUT CONTRAST TECHNIQUE: Multidetector CT imaging of the chest was performed following the standard protocol without IV contrast. Intravenous contrast was not administered due to the patient's chronic kidney disease. COMPARISON:  No prior CT.  Chest x-rays 05/20/2015 and earlier. FINDINGS: Best seen on coronal reformatted images is abrupt occlusion of the right upper lobe bronchus and the right bronchus intermedius centrally. As a result, there is dense consolidation in the entire right lung with a areas of hypoattenuation giving a "drowned lung" appearance. Without intravenous contrast, it is difficult to visualize the large obstructing mass as it has similar attenuation to the consolidated lung. There is an associated small to moderate sized right pleural effusion. No pulmonary parenchymal nodules or masses involving the left lung. Linear scarring involving the medial left lower lobe. No confluent airspace consolidation in the left lung. No left pleural effusion. Apparent pleural thickening throughout the left hemithorax is related to abundant subpleural fat. Bulky right paratracheal conglomerate lymphadenopathy in station 2R and station 4R  measuring approximately 5.6 x 5.0 x 7.4 cm. Enlarged lymph nodes elsewhere in the right side of the mediastinum. Enlarged right retroclavicular lymph nodes, the largest measuring approximately 2.8 x 3.8 x 3.1 cm. The bulky mediastinal lymphadenopathy compresses the superior vena cava, and there is a prominent right internal mammary vein collateral. Heart size upper normal. No pericardial effusion. Mild LAD and right coronary artery atherosclerosis. Moderate atherosclerosis involving the thoracic and upper abdominal aorta and their visualized branches. Approximate 3 cm cyst involving the posterior segment right lobe of liver. Within the limits of the unenhanced technique, no solid mass involving visualized liver. Approximate 1.6 x 2.6 x 2.2 cm low-attenuation mass involving the left adrenal gland. Solitary punctate calcification involving the proximal body of the pancreas. Visualized upper abdomen otherwise unremarkable for the unenhanced technique. IMPRESSION: 1. Obstructing central mass involving the right lung with occlusion of the right upper lobe bronchus and the bronchus intermedius with complete atelectasis of the right lung. The mass is difficult to visualize as it is of similar attenuation to the atelectatic lung. Bronchoscopy is recommended in further evaluation and for diagnosis. 2. Small to moderate-sized right pleural effusion. 3. Metastatic mediastinal lymphadenopathy as detailed above, the largest conglomerate nodal mass in the right paratracheal region. 4. Mild compression of the superior vena cava with a prominent right internal mammary vein collateral. 5. Approximate 3 cm cyst involving the posterior segment right lobe of the visualized liver. 6. Approximate 2.6 cm low-attenuation mass involving the left adrenal gland, likely adenoma. Electronically Signed   By: Evangeline Dakin M.D.   On: 05/20/2015 12:41   Ct Chest W Contrast  06/06/2015  CLINICAL DATA:  Followup chest radiograph. History of  CHF, COPD and hypertension. EXAM: CT CHEST WITH CONTRAST TECHNIQUE: Multidetector CT imaging of the chest was performed during intravenous contrast administration. CONTRAST:  83m OMNIPAQUE IOHEXOL 300 MG/ML  SOLN COMPARISON:  05/20/2015 FINDINGS: Mediastinum: The heart size is normal. No pericardial effusion. There is an endotracheal tube with tip above the carina. The nasogastric tube is present with tip in the body of the stomach. The index right supraclavicular lymph node measures 1.5 cm, image 5 of series 2. Previously 2.6 cm. Right paratracheal lymph node measure 2.0 cm, image 19 of series 2. Previously 3.3 cm. Pre-vascular lymph node measures 1.6 cm, image 25 of series 2. Previously 2.3 cm. Lungs/Pleura: There is a moderate size partially loculated right pleural effusion which has decreased in volume from previous exam. There has been interval improvement in aeration to the right upper lobe and superior segment of right lower lobe compatible with decrease in size of obstructing central right lung mass. Continued complete atelectasis and consolidation of the right middle lobe. The right lung mass is difficult to visualize and accurately remeasure. Mild atelectasis is noted within the posterior and medial left lower lobe. Upper Abdomen: Stable appearance of left adrenal nodule measuring 2.9 x 1.3 cm. The right adrenal gland is normal. Low to intermediate attenuation structure within the posterior right hepatic lobe measures 2.6 cm, image 57 of series 2. Unchanged from previous exam. There is mild perihepatic ascites noted. Musculoskeletal: Scoliosis deformity involves the thoracic spine. IMPRESSION: 1. Interval response to therapy. Since the previous exam there has been improved aeration to the right upper lobe and superior segment of the right lower lobe. Findings are compatible with decrease in size of central obstructing right perihilar lesion 2. Decrease in size of mediastinal and right supraclavicular  adenopathy. 3. No new or progressive disease identified.  Electronically Signed   By: Kerby Moors M.D.   On: 06/06/2015 10:39   [place] Chest  06/02/2015  CLINICAL DATA:  Pleural effusion.  Lung cancer. EXAM: CHEST ULTRASOUND COMPARISON:  Chest x-ray 06/02/2015. FINDINGS: Moderate right-sided pleural effusion noted. IMPRESSION: Moderate right pleural effusion . Electronically Signed   By: Marcello Moores  Register   On: 06/02/2015 10:05   US Renal  05/21/2015  CLINICAL DATA:  Acute renal failure EXAM: RENAL / URINARY TRACT ULTRASOUND COMPLETE COMPARISON:  None. FINDINGS: Right Kidney: Length: 11 cm. Echogenicity within normal limits. No mass or hydronephrosis visualized. There is cortical thinning probable due to atrophy Left Kidney: Length: 12.2 cm. Cortical thinning is noted probable due to atrophy. Echogenicity within normal limits. No mass or hydronephrosis visualized. Bladder: The urinary bladder is decompressed with Foley catheter. IMPRESSION: 1. No hydronephrosis. No renal calculi. Bilateral cortical thinning probable due to atrophy. Decompressed urinary bladder with Foley catheter. Electronically Signed   By: Lahoma Crocker M.D.   On: 05/21/2015 09:25   Dg Chest Port 1 View  06/12/2015  CLINICAL DATA:  Short of breath. EXAM: PORTABLE CHEST 1 VIEW COMPARISON:  06/09/2015 FINDINGS: Endotracheal tube remains in good position. Right arm PICC tip in the proximal SVC unchanged. NG tube in place with the tip not visualized Complete opacification of the right chest which has progressed since the prior study. This is likely due to pleural effusion and collapse. Underlying pneumonia or tumor not excluded. Cardiac enlargement. Vascular congestion and on the left without pulmonary edema or effusion on the left. IMPRESSION: Endotracheal tube remains in good position Complete opacification of the right chest due to enlarging effusion and collapse of the right lung. Electronically Signed   By: Franchot Gallo M.D.   On:  06/12/2015 07:44   Dg Chest Port 1 View  06/09/2015  CLINICAL DATA:  Intubation and OG tube placement EXAM: PORTABLE CHEST 1 VIEW COMPARISON:  1 day prior FINDINGS: Endotracheal tube terminates 4.1 cm above carina.Nasogastric tube extends beyond the inferior aspect of the film. A right-sided PICC line is poorly visualized centrally but likely terminates at the low SVC. The patient is minimally rotated left. Cardiomegaly accentuated by AP portable technique. Layering right pleural effusion. No pneumothorax. Interstitial edema is asymmetric and worse on the right. Lower lobe predominant right worse than left airspace disease is minimally improved. IMPRESSION: Minimal improvement in aeration since the exam of 1 day prior. Congestive heart failure with asymmetric interstitial and airspace opacities, worse on the right. Concurrent infection cannot be excluded. Layering right pleural effusion. Electronically Signed   By: Abigail Miyamoto M.D.   On: 06/09/2015 12:09   Dg Chest Port 1 View  06/08/2015  CLINICAL DATA:  Respiratory failure.  Followup exam. EXAM: PORTABLE CHEST 1 VIEW COMPARISON:  Chest CT and chest radiographs, 06/06/2015. FINDINGS: There is near complete opacification of the right hemi thorax consistent with combination of atelectasis/ consolidation and pleural fluid. There is, however, some more aeration of the mid to upper lung on the right than there was on the prior chest radiographs. Left lung is essentially clear.  No pneumothorax. Right-sided PICC is stable. Endotracheal tube and nasogastric tube have been removed. IMPRESSION: 1. Mild improvement from prior study with some more aeration evident of the right mid and upper lung. This is consistent with a decrease in lung consolidation/atelectasis. There is still significant opacification throughout most of the right hemi thorax. No new abnormalities. Electronically Signed   By: Lajean Manes M.D.   On:  06/08/2015 08:14   Dg Chest Port 1  View  06/06/2015  CLINICAL DATA:  Respiratory failure . EXAM: PORTABLE CHEST 1 VIEW COMPARISON:  06/02/2015.  CT 05/20/2015 . FINDINGS: Endotracheal tube 8 cm above the carina at the thoracic inlet. Slight interim advancement should be considered. NG tube and right PICC line in stable position. Progressive consolidation/ atelectasis of the right lung and progressive right pleural effusion. Right pleural effusion is large. Left lung is clear. Stable cardiomegaly. IMPRESSION: 1. Endotracheal tube 8 cm above the carina at the thoracic inlet. Slightly mid aspect should be considered. NG tube and right PICC line stable position. 2. Progression of consolidation/atelectasis of the right lung with progression of the right pleural effusion. The right pleural effusion is large. 3. Stable cardiomegaly. 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 Unchanged position of endotracheal tube, terminating suitably above the carina, 4.5 cm. Unchanged gastric tube, terminating out of the field of view. Unchanged right upper extremity PICC, which terminates at the level the right mainstem bronchus. Overlying EKG leads and ventilator tubing. Atherosclerosis of the aortic arch. Dense opacity of the right lung persists, with obscuration the right hemidiaphragm, right heart borders, and the majority of the right lung. Minimal maintained aeration at the right apex. Airspace opacity at the left base persists, with similar aeration to the prior. No pneumothorax. IMPRESSION: Similar appearance of dense right-sided airspace opacity, likely a combination of lung consolidation, atelectasis,  tumor tissue, and pleural effusion. Unchanged position of support apparatus, as above. Airspace disease at the left base, similar to the comparison, likely combination of atelectasis, edema, consolidation. Small left pleural effusion not excluded. Atherosclerosis. Signed, Dulcy Fanny. Earleen Newport, DO [facility] Electronically Signed   By: Corrie Mckusick D.O.   On: 06/02/2015 08:23   Dg Chest Port 1 View  05/26/2015  CLINICAL DATA:  Respiratory failure. EXAM: PORTABLE CHEST 1 VIEW COMPARISON:  05/25/2015 .  CT 05/20/2015. FINDINGS: Endotracheal tube and NG tube noted in stable position. Interim increase consolidation in the right upper lobe. Persistent right pleural effusion. Left lung is clear. Stable cardiomegaly. No pneumothorax. No acute osseus abnormality P IMPRESSION: Interim increase in consolidation of the right upper lobe with persistent right pleural effusion. Persistent occlusion of the right mainstem bronchus appears to be present. Electronically Signed   By: [place]   On: 05/26/2015 07:13   Dg Chest Port 1 View  05/25/2015  CLINICAL DATA:  Respiratory failure. EXAM: PORTABLE CHEST 1 VIEW COMPARISON:  05/21/2015 FINDINGS: The ET tube tip is above the carina. The nasogastric tube tip is below the GE junction. Normal heart size. Aortic atherosclerosis. Partially loculated right pleural effusion is again identified and appears unchanged from the previous exam. Left lung is clear. IMPRESSION: 1. Loculated right pleural effusion is stable. 2. Left lung is clear. Electronically Signed   By: Kerby Moors M.D.   On: 05/25/2015 09:03   Dg Chest Port 1 View  05/21/2015  CLINICAL DATA:  Respiratory failure, intubated EXAM: PORTABLE CHEST 1 VIEW COMPARISON:  05/20/2015 FINDINGS: Cardiomegaly again noted. Stable endotracheal and NG tube position. Again noted almost complete opacification of the right hemi  thorax. Chronic elevation of the right  hemidiaphragm. Mild left basilar atelectasis. No convincing pulmonary edema. No pneumothorax. Atherosclerotic calcifications of thoracic aorta again noted. IMPRESSION: Stable endotracheal and NG tube position. Again noted almost complete opacification of the right hemi thorax. Chronic elevation of the right hemidiaphragm. Mild left basilar atelectasis. No convincing pulmonary edema. No pneumothorax. Atherosclerotic calcifications of thoracic aorta again noted. Electronically Signed   By: Lahoma Crocker M.D.   On: 05/21/2015 08:22   Dg Chest Port 1 View  05/20/2015  CLINICAL DATA:  Acute respiratory failure with hypoxia. Status post bronchoscopy and line placement. EXAM: PORTABLE CHEST 1 VIEW COMPARISON:  05/20/2015 at 1302 hours FINDINGS: Following bronchoscopy, there is now partial aeration of the right lung noted in the right upper to mid lung. There is also some aeration now noted in the right lower lung with a band of opacity noted across the mid lung bordering the minor fissure. Left lung is hyperexpanded but essentially clear. No pneumothorax. Endotracheal tube is stable with its tip 4.7 cm above the carina. Orogastric tube passes below the diaphragm. IMPRESSION: 1. Improved right lung aeration following bronchoscopy. No pneumothorax. 2. No other change. Electronically Signed   By: Lajean Manes M.D.   On: 05/20/2015 17:42   Dg Chest Port 1 View  05/20/2015  CLINICAL DATA:  Hypoxia EXAM: PORTABLE CHEST 1 VIEW COMPARISON:  Chest CT obtained earlier in the day; chest radiograph May 19, 2015 FINDINGS: Endotracheal tube tip is 4.6 cm above the carina. Nasogastric tube tip and side port are below the diaphragm. No pneumothorax. There is now complete opacification of the right hemi thorax, felt to be due to a combination of effusion and consolidation. Left lung is clear. Heart size is within normal limits. There is atherosclerotic change in the aorta. Mass lesions seen on CT are obscured on the right by the  diffuse consolidation and effusion. IMPRESSION: Tube positions as described without pneumothorax. Left lung clear. Complete opacification on the right. Cardiac silhouette within normal limits. Atherosclerotic calcification noted. Electronically Signed   By: Lowella Grip III M.D.   On: 05/20/2015 13:18   Dg Chest Portable 1 View  05/11/2015  CLINICAL DATA:  69 year old female with increasing shortness of breath Coll the today. EXAM: PORTABLE CHEST 1 VIEW COMPARISON:  Chest x-ray 04/24/2015. FINDINGS: Enlarging right pleural effusion. Extensive opacities throughout the right mid to lower lung, which may simply reflect passive atelectasis, however, underlying airspace consolidation or underlying mass is not excluded. Persistent prominence of right paratracheal soft tissue highly concerning for lymphadenopathy. Left lung appears relatively clear, although the left base is poorly visualized medially (likely technique related). No evidence of pulmonary edema. Heart size is normal. Atherosclerotic calcifications in the thoracic aorta. IMPRESSION: 1. Overall, there has been significant progression compared to the prior chest x-ray from 04/24/2015, with enlarging now a large right pleural effusion and worsening aeration throughout the right lung. This is unusual in the setting of a treated pneumonia, and given the presence of paratracheal soft tissue thickening on the right which is suspicious for lymphadenopathy, underlying neoplasm is strongly suspected. Further evaluation with contrast enhanced chest CT in is strongly recommended in the near future to evaluate for underlying neoplasm. 2. Atherosclerosis. Electronically Signed   By: Vinnie Langton M.D.   On: 05/18/2015 01:27   Dg Abd Portable 1v  06/09/2015  CLINICAL DATA:  OG tube placement EXAM: PORTABLE ABDOMEN - 1 VIEW COMPARISON:  06/06/2015 FINDINGS: The nasogastric tube tip is in the body  of stomach. Side port is well below the GE junction. Mild  gaseous distension of large and small bowel loops unchanged. IMPRESSION: 1. NG tube tip is in the body of stomach. Electronically Signed   By: Kerby Moors M.D.   On: 06/09/2015 12:04   Dg Abd Portable 1v  05/20/2015  CLINICAL DATA:  OG an indentation. EXAM: PORTABLE ABDOMEN - 1 VIEW COMPARISON:  None. FINDINGS: Enteric tube tip is adequately positioned in the body of the stomach. Mildly prominent gas-filled loops of small bowel are noted within the right lower quadrant. Overall bowel gas pattern is indeterminate. No evidence of free intraperitoneal air seen. Large dense opacity noted at the right lung base. IMPRESSION: 1. OG tube appears adequately positioned with tip in the region of the stomach body. 2. Large dense opacity at the right lung base, incompletely imaged, which could be consolidation or effusion. Electronically Signed   By: Franki Cabot M.D.   On: 05/20/2015 13:19    ASSESSMENT:  Small cell lung cancer.   PLAN:    1. Small cell lung cancer: Patient is likely stage IV given her suspicious adrenal lesion, but full staging workup cannot be completed given her acute illness. Patient was extubated last weekend, but then subsequently reintubated. CT scan results reviewed independently from last week revealed a positive response to her chemotherapy with decreased size of her mass. Her persistent requirement for mechanical ventilation may more likely be secondary to poor lung function, pleural effusion, and COPD than external compression of her mass at this point.  Patient has completed cycle 1 of treatment with Cisplatin and Etoposide. Since tracheostomy and Pleurx catheter are scheduled for next week, will delay patient's next round of chemotherapy one week until after Thanksgiving. Will proceed with cycle 2, day 1 of chemotherapy on June 23, 2015. She will ultimately require port placement once she is extubated. XRT would be helpful, but there are patient safety concerns transporting to  radiation oncology.   2. Thrombocytopenia: Platelet count is improving. Monitor. 3. Anemia:  Likely secondary to chemotherapy. Continue to transfuse if hemoglobin falls below 7.0.  4. Tracheostomy:  Case discussed with ENT and Pulmonology. Patient is still high risk for tracheostomy placement even with platelet count greater than 100.  Current plan is to place on Monday. Will delay chemotherapy one week as above. 5. Disposition: Long-term ventilation and tracheostomy facilities have been discussed, but this poses a problem for transportation back and forth for chemotherapy. Case previously discussed with case management as well as long-term facility representative.    I will be out of town all of next week and will return on Monday, June 23, 2015. Please contact Dr. Rogue Bussing or the on-call physician if there are any questions or concerns.  Lloyd Huger, MD   06/13/2015 3:28 PM

## 2015-06-13 NOTE — Progress Notes (Signed)
ELECTROLYTE CONSULT NOTE - INITIAL  Pharmacy Consult for electrolyte monitoring  Allergies  Allergen Reactions  . Atorvastatin Other (See Comments)    Does not remember why she had Intolerance to Lipitor.  . Rosuvastatin Anxiety    Chest tigtness and feeling as if she had heartburn.    Patient Measurements: Height: '5\' 7"'$  (170.2 cm) Weight: 257 lb 15 oz (117 kg) IBW/kg (Calculated) : 61.6   Vital Signs: Temp: 99.7 F (37.6 C) (11/18 2000) Temp Source: Oral (11/18 2000) BP: 103/46 mmHg (11/18 1900) Pulse Rate: 87 (11/18 1900) Intake/Output from previous day: 11/17 0701 - 11/18 0700 In: 4478.7 [I.V.:2307.7; Blood:330; NG/GT:1741; IV Piggyback:100] Out: 2400 [Urine:2400] Intake/Output from this shift: Total I/O In: -  Out: 350 [Urine:350]  Labs:  Recent Labs  06/12/15 0437 06/13/15 0421 06/13/15 1412  WBC 14.0* 11.0 11.7*  HGB 6.9* 7.5* 8.1*  HCT 21.6* 23.6* 24.7*  PLT 110* 110* 108*  APTT  --   --  33  INR  --   --  1.15     Recent Labs  06/11/15 0502 06/12/15 1155 06/13/15 0421 06/13/15 1412  NA 141  --  133* 141  K 3.6  --  3.2* 4.4  CL 104  --  96* 101  CO2 33*  --  32 35*  GLUCOSE 167*  --  445* 197*  BUN 14  --  19 21*  CREATININE 0.83  --  0.72 0.69  CALCIUM 8.3*  --  7.5* 8.4*  MG  --  1.6*  --  1.4*  PHOS  --  3.2  --  2.8  TRIG 301*  --   --   --    Estimated Creatinine Clearance: 90.3 mL/min (by C-G formula based on Cr of 0.69).    Recent Labs  06/13/15 1149 06/13/15 1602 06/13/15 1939  GLUCAP 177* 167* 182*    Medical History: Past Medical History  Diagnosis Date  . Anxiety   . Arthritis   . COPD (chronic obstructive pulmonary disease) ([place])   . CHF (congestive heart failure) ([place])   . Hyperlipidemia   . Hypertension   . Diabetes mellitus without complication ([place])   . Chronic kidney disease   . Neuromuscular disorder [facility])     Assessment: 69 year old female with SCLC and currently intubated. Pharmacy consulted for  monitoring and managing electrolytes.  Phos: 2.8, Mag: 1.4, K: 4.4  Plan: Potassium replaced with 4 runs IV this AM. Will order Magnesium Sulfate 2g IV once. Will f/u am labs.   Paulina Fusi, PharmD, BCPS 06/13/2015 9:55 PM

## 2015-06-13 NOTE — Progress Notes (Signed)
[facility] Inpatient Post-Op Note  Patient ID: BRONWYN BELASCO, female   DOB: 1946/07/14, 69 y.o.   MRN: [id number]  HISTORY: I have been asked to see Mrs. Fuller Plan for management of her right pleural effusion. She has had a long protracted ICU course and remains intubated with respiratory failure. She has a diagnosis of small cell carcinoma and has been treated with an attempt to get the patient out of the intensive care unit. Over the last several days she's developed a large right-sided pleural effusion. In addition she is scheduled for tracheostomy by Dr. Margaretha Sheffield next week.   Filed Vitals:   06/13/15 1000  BP: 136/48  Pulse: 87  Temp:   Resp: 0     EXAM: The patient is currently intubated and heavily sedated. She is unable to give informed consent. She does appear to be moving all 4 extremities. Examination of her chest reveals very limited breath sounds on the right. There is extensive anasarca. Her heart is regular. There are no murmurs.    ASSESSMENT: I have reviewed reviewed her CT scans scans and her chest x-rays. There is a large right pleural effusion. At the present time it would appear that she has had some response to her chemotherapy. It would not be unreasonable to place a Pleurx catheter for management of her pleural effusion. This must be done in the operating room given her body habitus. Therefore I will try to coordinate this with Dr. Dessa Phi. I will need to contact the family to discuss this with them.   PLAN:   We will attempt to work together next week to perform both the tracheostomy and the Pleurx catheter. She will have some routine laboratory work done today. I will contact the family. I did discuss her care today with Dr. Louie Bun who is in agreement.    Nestor Lewandowsky, MD

## 2015-06-13 NOTE — Progress Notes (Signed)
[facility] [place] NAME: Emily Pratt    MR#:  [id number]  DATE OF BIRTH:  21-May-1946  SUBJECTIVE:  CHIEF COMPLAINT: Pt is awake and alert, extubated 11. 12, placed on BIPAP, now reintubated on full mechanical ventilation, 65% FiO2. Sedated no review of systems. Anemic, status post packed red blood cell transfusion , sedated. No review of systems REVIEW OF SYSTEMS:   Unavailable as patient is sedated  DRUG ALLERGIES:   Allergies  Allergen Reactions  . Atorvastatin Other (See Comments)    Does not remember why she had Intolerance to Lipitor.  . Rosuvastatin Anxiety    Chest tigtness and feeling as if she had heartburn.    VITALS:  Blood pressure 127/46, pulse 86, temperature 98.7 F (37.1 C), temperature source Oral, resp. rate 11, height '5\' 7"'$  (1.702 m), weight 117 kg (257 lb 15 oz), SpO2 95 %.  PHYSICAL EXAMINATION:  GENERAL:  69 y.o.-year-old patient lying in the bed, somnolent, intubated ,, comfortable, sleepy S: Pupils equal, round, reactive to light and accommodation. No scleral icterus.  HEENT: Head atraumatic, normocephalic. ETT in place NECK:  Supple, no jugular venous distention. No thyroid enlargement, no tenderness.  LUNGS:  markedly diminished Air entrance on the right, especially in the right lower lobe , relatively good air entrance on the left, some rhonchi on the , dull percussion on the right  CARDIOVASCULAR: S1, S2 normal. No murmurs, rubs, or gallops.  ABDOMEN: Soft, minimally tender diffusely, distended. Bowel sounds present. No organomegaly or mass.  EXTREMITIES: +1 pedal edema, no cyanosis, or clubbing.  NEUROLOGIC: CN 2-12able to assess as patient is asleep,  unable to follow commands PSYCHIATRIC: , intubated, sedated  SKIN: Sacral decubitus ulcer    LABORATORY PANEL:   CBC  Recent Labs Lab 06/13/15 0421  WBC 11.0  HGB 7.5*  HCT 23.6*  PLT 110*    ------------------------------------------------------------------------------------------------------------------  Chemistries   Recent Labs Lab 06/08/15 0517  06/12/15 1155 06/13/15 0421  NA 144  < >  --  133*  K 3.9  < >  --  3.2*  CL 108  < >  --  96*  CO2 28  < >  --  32  GLUCOSE 126*  < >  --  445*  BUN 16  < >  --  19  CREATININE 0.73  < >  --  0.72  CALCIUM 8.4*  < >  --  7.5*  MG 1.7  < > 1.6*  --   AST 22  --   --   --   ALT 45  --   --   --   ALKPHOS 113  --   --   --   BILITOT 1.0  --   --   --   < > = values in this interval not displayed. ------------------------------------------------------------------------------------------------------------------  Cardiac Enzymes No results for input(s): TROPONINI in the last 168 hours. ------------------------------------------------------------------------------------------------------------------  RADIOLOGY:  Dg Chest Port 1 View  06/12/2015  CLINICAL DATA:  Short of breath. EXAM: PORTABLE CHEST 1 VIEW COMPARISON:  06/09/2015 FINDINGS: Endotracheal tube remains in good position. Right arm PICC tip in the proximal SVC unchanged. NG tube in place with the tip not visualized Complete opacification of the right chest which has progressed since the prior study. This is likely due to pleural effusion and collapse. Underlying pneumonia or tumor not excluded. Cardiac enlargement. Vascular congestion and on the left without pulmonary edema or effusion on the  left. IMPRESSION: Endotracheal tube remains in good position Complete opacification of the right chest due to enlarging effusion and collapse of the right lung. Electronically Signed   By: Franchot Gallo M.D.   On: 06/12/2015 07:44    EKG:   Orders placed or performed during the hospital encounter of 05/05/2015  . EKG 12-Lead  . EKG 12-Lead    ASSESSMENT AND PLAN:   1. Acute on chronic respiratory failure with hypoxia and Hypercapnia,  - Multifactorial due to large  right-sided pleural effusion/ CHF/ COPD / mass- SCLC - Pulmonology and oncology is following, extubated  November 12 th, 2016,  reintubated 11.14.16 by DR Mortimer Fries . Oncology gave chemotherapy October 31st 2016 with cisplatin and etoposide with some shrinking of the mass radiologically and clinically. DR Tami Ribas to planning to perform tracheostomy November 17 , as platelet count remains relatively stable. Chest X-ray 06/09/2015 showed improvement in aeration. However, most recent chest x-ray reveals complete obliteration of right chest again due to pleural effusion and worsening atelectasis, questionable pneumonia. Scant ET tube suctioning, per nursing staff. Dr. Faith Rogue saw patient in consultation and recommended Pleurx catheter placement to right chest for  Malignant pleural effusion management   2. Acute Congestive heart failure: Mixed systolic and diastolic  - Echo 16/10 shows ejection fraction 30 -35% with abnormal filling -Resume  metoprolol. Continue  Lasix . Following blood pressure readings as well as kidney function and urinary output closely  3. Acute on chronic kidney injury:  - Due to ATN, hypovolemia and hypotension, resolved. - Nephrology signed off. Patient was restarted on losartan, now off due to hypotension ,resuming metoprolol as patient's blood pressure has improved after transfusion of packed red blood cells. Follow with IV Lasix . Patient is off  IVF. Kidney function remains stable   4. Essential hypertension, now hypotensive, likely due to medications,, unlikely infection ,all cultures are negative so far , resume low-dose of metoprolol.  Holding losartan, norvasc, transfusing packed red blood cells and following blood pressure readings closely, panculture, including sputum, urine.   5. Diabetes mellitus type 2: controlled. Holding home  medications  Now back on tube feeds . Patient was hypoglycemic recently, blood glucose levels are ranging between 100-150 over the past 24 hours,  now just on Accu-Cheks  6.  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   - Oncology initiated chemotherapy ,  PICC line for chemotherapy is placed 05/26/2015-chest [place]: moderate right side pleural effusion, which is not seen on chest x-ray or CT scan of the chest.  CT chest: Revealed Interval response to therapy,  improved aeration to the right upper lobe,  decrease in size of central obstructing right perihilar lesion,  supraclavicular Adenopathy. Consulted ENT to place TT ,  Dr. Tami Ribas , we'll proceed to place it on Monday, November 17 , Dr. Faith Rogue is planning to place Pleurx catheter to the right chest to manage malignant right pleural effusion November 17    7. Postobstructive pneumonia versus atelectasis - Blood cultures negative to date, respiratory, BAL, AFB normal flora - Discontinued vancomycin and Zosyn, completed 7 day Levaquin course in the recent past . Reculture sputum,  8. Arrhythmia, SVT versus paroxysmal atrial fibrillation during bronchoscopy, now in sinus rhythm, resume metoprolol ,  no arrhythmias noted  9. Pancytopenia .  Resolved with Neupogen and supportive therapy after chemotherapy Improved overall, appreciate  Oncologist input, patienet is off Neupogen now, white blood cell count has improved. No platelet transfusion at this time per oncology, as above 110,000 today. .  patient is severely anemic. We will transfuse packed red blood cells today, risks as well as benefits were discussed with patient's husband , who was agreeable for transfusion , F/u CBC closely   10. Hypernatremia, resolved with advance tube water influx, stable Na today  11. UTI, Ur cx showed yeast,  of Rocephin IV, off Diflucan  12. Ileus, following . X-ray revealed  increased gas filled loops of the large and small bowel, unchanged from before, now on tube feeds. Rectal tube is discontinued  13 Hypokalemia. KCl. Resolved  14.  Hypomagnesemia.  Mag iv. Follow level intermittently     CODE STATUS: Full code  TOTAL CRITICAL CARE TIME TAKING CARE OF THIS PATIENT 40. Lubertha South M.D on 06/13/2015 at 12:49 PM  Between 7am to 6pm - Pager - [phone] After 6pm go to [email or web address] - password [email or web address] [facility]  Office  [phone]  CC: Primary care physician; Dagoberto Ligas, MD

## 2015-06-13 NOTE — Care Management (Signed)
Confirmed that attending feels that patient will be able to wean from the vent.  It is planned that she will have trach and chest tube placed 11/21. Remains on levophed.

## 2015-06-13 NOTE — Progress Notes (Signed)
[facility] Progress Note Patient Name: [name] DOB: 12-Nov-1945 MRN: [id number]   Date of Service  06/13/2015  HPI/Events of Note  K+ = 3.2 and Creatinine = 0.72.  eICU Interventions  Will order: 1. Replete K. 2. Recheck BMP at 1 PM.     Intervention Category Intermediate Interventions: Electrolyte abnormality - evaluation and management  Sommer,Steven Eugene 06/13/2015, 6:26 AM

## 2015-06-13 NOTE — Progress Notes (Signed)
CC: follow up resp failure HPI remains intubated, sedated, critically ill Re-intubated 11/14-now on full vent support, fio2 65% Will need trach-ENT consulted plt count 119>>110 Plan for trach on Monday, plan for chest tube as well by Dr L[place]le [place] Vitals:   06/13/15 0500 06/13/15 0600 06/13/15 0700 06/13/15 0800  BP: 115/44 135/48 117/42 123/49  Pulse: 86 85 84 85  Temp:    98.7 F (37.1 C)  TempSrc:    Oral  Resp: '16 13 9 13  '$ Height:      Weight:      SpO2: 96% 95% 95% 94%   Obese, cognition intact, RASS 0 HEENT WNL JVP cannot be visualized Very coarse BS R>L, no wheezes Reg, no M Abd obese, soft, +BS Trace symmetric LE edema No focal neuro deficits, gcs 10T  BMP Latest Ref Rng 06/13/2015 06/11/2015 06/10/2015  Glucose 65 - 99 mg/dL 445(H) 167(H) -  BUN 6 - 20 mg/dL 19 14 -  Creatinine 0.44 - 1.00 mg/dL 0.72 0.83 -  Sodium 135 - 145 mmol/L 133(L) 141 141  Potassium 3.5 - 5.1 mmol/L 3.2(L) 3.6 -  Chloride 101 - 111 mmol/L 96(L) 104 -  CO2 22 - 32 mmol/L 32 33(H) -  Calcium 8.9 - 10.3 mg/dL 7.5(L) 8.3(L) -    CBC Latest Ref Rng 06/13/2015 06/12/2015 06/11/2015  WBC 3.6 - 11.0 K/uL 11.0 14.0(H) 21.1(H)  Hemoglobin 12.0 - 16.0 g/dL 7.5(L) 6.9(L) 7.9(L)  Hematocrit 35.0 - 47.0 % 23.6(L) 21.6(L) 25.2(L)  Platelets 150 - 440 K/uL 110(L) 110(L) 119(L)     IMPRESSION: Acute respiratory failure with hypoxemia from acute COPD exacerbation with acute Tx for Small cell lung cancer New diagnosis of small cell ca - s/p chemo, XRT  PLAN/REC: 1.Respiratory Failure -continue Full MV support -continue Bronchodilator Therapy -Wean Fio2 and PEEP as tolerated Will need trach for survival-planned for Monday, chest tube planned as well, will update husband when he is available Cont empiric nebulized bronchodilators- 2.Lung mass On chemo therapy -next cycle next week  3.COPD-continue bd therapy and streroids     I have personally obtained a history, examined the patient,  evaluated Pertinent laboratory and RadioGraphic/imaging results, and  formulated the assessment and plan  The Patient requires high complexity decision making for assessment and support, frequent evaluation and titration of therapies, application of advanced monitoring technologies and extensive interpretation of multiple databases. Critical Care Time devoted to patient care services described in this note is 40 minutes.   Overall, patient is critically ill, prognosis is guarded.  Patient with Multiorgan failure and at high risk for cardiac arrest and death.    Corrin Parker, M.D.  [facility]  Medical Director [place] Director [facility] Cardio-Pulmonary Department

## 2015-06-13 NOTE — Progress Notes (Signed)
Nutrition Follow-up    INTERVENTION:   EN: recommend continuing current TF regimen  NUTRITION DIAGNOSIS:   Inadequate oral intake related to acute illness as evidenced by NPO status.  GOAL:   Patient will meet greater than or equal to 90% of their needs  MONITOR:    (Energy Intake, Anthropometrics, Electrolyte/Renal Profile, Digestive System, Electrolyte/Renal Profile)  REASON FOR ASSESSMENT:   Consult Enteral/tube feeding initiation and management  ASSESSMENT:    Pt remains on vent, plan for trach and chest tube next week, on levophed  Diet Order:   NPO  EN: tolerating Vital High Protein at rate of 60 ml/hr with Prostat daily  Skin:   (Stage II pressure ulcer on sacrum)   Digestive System: no signs of TF intolerance, +BM 11/18  Electrolyte and Renal Profile:  Recent Labs Lab 06/08/15 0517 06/09/15 0521 06/10/15 0511 06/10/15 1500 06/11/15 0502 06/12/15 1155 06/13/15 0421  BUN '16 13 11  '$ --  14  --  19  CREATININE 0.73 0.66 0.65  --  0.83  --  0.72  NA 144 147* 148* 141 141  --  133*  K 3.9 4.2 3.9  --  3.6  --  3.2*  MG 1.7 1.5* 1.6*  --   --  1.6*  --   PHOS 3.3 3.7  --   --   --  3.2  --    Glucose Profile:  Recent Labs  06/13/15 0401 06/13/15 0730 06/13/15 1149  GLUCAP 194* 195* 177*   Nutritional Anemia Profile:  CBC Latest Ref Rng 06/13/2015 06/12/2015 06/11/2015  WBC 3.6 - 11.0 K/uL 11.0 14.0(H) 21.1(H)  Hemoglobin 12.0 - 16.0 g/dL 7.5(L) 6.9(L) 7.9(L)  Hematocrit 35.0 - 47.0 % 23.6(L) 21.6(L) 25.2(L)  Platelets 150 - 440 K/uL 110(L) 110(L) 119(L)    Meds: lasix, ss novolog  Height:   Ht Readings from Last 1 Encounters:  05/24/2015 '5\' 7"'$  (1.702 m)    Weight:   Wt Readings from Last 1 Encounters:  06/13/15 257 lb 15 oz (117 kg)   Filed Weights   06/11/15 0345 06/12/15 0436 06/13/15 0405  Weight: 257 lb 4.4 oz (116.7 kg) 256 lb 13.4 oz (116.5 kg) 257 lb 15 oz (117 kg)    BMI:  Body mass index is 40.39 kg/(m^2).  Estimated  Nutritional Needs:   Kcal:  1254-1596kcals, (11-14kcals/kg) using current weight of 114kg)  Protein:  122-153 g (2.0-2.5 g/kg)   Fluid:  1535-1884m of fluid (25-368mkg)  EDUCATION NEEDS:   No education needs identified at this time  CaHomeacre-LyndoraRDFairfieldLDSan [id number] [phone]

## 2015-06-13 NOTE — Progress Notes (Signed)
MEDICATION RELATED CONSULT NOTE - Follow Up  Pharmacy Consult for Constipation Prevention   Allergies  Allergen Reactions  . Atorvastatin Other (See Comments)    Does not remember why she had Intolerance to Lipitor.  . Rosuvastatin Anxiety    Chest tigtness and feeling as if she had heartburn.    Patient Measurements: Height: '5\' 7"'$  (170.2 cm) Weight: 257 lb 15 oz (117 kg) IBW/kg (Calculated) : 61.6   Vital Signs: Temp: 98.7 F (37.1 C) (11/18 0800) Temp Source: Oral (11/18 0800) BP: 127/46 mmHg (11/18 1100) Pulse Rate: 86 (11/18 1100) Intake/Output from previous day: 11/17 0701 - 11/18 0700 In: 4478.7 [I.V.:2307.7; Blood:330; NG/GT:1741; IV Piggyback:100] Out: 2400 [Urine:2400] Intake/Output from this shift: Total I/O In: 810.7 [I.V.:180.7; NG/GT:380; IV Piggyback:250] Out: -   Labs:  Recent Labs  06/11/15 0502 06/12/15 0437 06/12/15 1155 06/13/15 0421  WBC 21.1* 14.0*  --  11.0  HGB 7.9* 6.9*  --  7.5*  HCT 25.2* 21.6*  --  23.6*  PLT 119* 110*  --  110*  CREATININE 0.83  --   --  0.72  MG  --   --  1.6*  --   PHOS  --   --  3.2  --    Estimated Creatinine Clearance: 90.3 mL/min (by C-G formula based on Cr of 0.72).   Microbiology: Recent Results (from the past 720 hour(s))  MRSA PCR Screening     Status: None   Collection Time: 05/24/2015 12:28 AM  Result Value Ref Range Status   MRSA by PCR NEGATIVE NEGATIVE Final    Comment:        The GeneXpert MRSA Assay (FDA approved for NASAL specimens only), is one component of a comprehensive MRSA colonization surveillance program. It is not intended to diagnose MRSA infection nor to guide or monitor treatment for MRSA infections.   Culture, blood (routine x 2)     Status: None   Collection Time: 05/07/2015  1:13 AM  Result Value Ref Range Status   Specimen Description BLOOD RIGHT HAND  Final   Special Requests BOTTLES DRAWN AEROBIC AND ANAEROBIC 3CC  Final   Culture NO GROWTH 5 DAYS  Final   Report  Status 05/24/2015 FINAL  Final  Culture, blood (routine x 2)     Status: None   Collection Time: 05/17/2015  1:13 AM  Result Value Ref Range Status   Specimen Description BLOOD RIGHT ASSIST CONTROL  Final   Special Requests BOTTLES DRAWN AEROBIC AND ANAEROBIC 4CC  Final   Culture NO GROWTH 5 DAYS  Final   Report Status 05/24/2015 FINAL  Final  Culture, expectorated sputum-assessment     Status: None   Collection Time: 05/20/15  2:00 PM  Result Value Ref Range Status   Specimen Description EXPECTORATED SPUTUM  Final   Special Requests NONE  Final   Sputum evaluation THIS SPECIMEN IS ACCEPTABLE FOR SPUTUM CULTURE  Final   Report Status 05/20/2015 FINAL  Final  Culture, respiratory (NON-Expectorated)     Status: None   Collection Time: 05/20/15  2:00 PM  Result Value Ref Range Status   Specimen Description EXPECTORATED SPUTUM  Final   Special Requests NONE Reflexed from [id number]  Final   Gram Stain   Final    FEW WBC SEEN FEW GRAM POSITIVE RODS FAIR SPECIMEN - 70-80% WBCS    Culture APPEARS TO BE NORMAL FLORA  Final   Report Status 05/22/2015 FINAL  Final  Culture, bal-quantitative     Status:  None   Collection Time: 05/20/15  5:05 PM  Result Value Ref Range Status   Specimen Description BRONCHIAL ALVEOLAR LAVAGE  Final   Special Requests Normal  Final   Gram Stain   Final    MODERATE WBC SEEN RARE GRAM POSITIVE RODS RARE GRAM POSITIVE COCCI IN CHAINS GOOD SPECIMEN - 80-90% WBCS    Culture APPEARS TO BE NORMAL FLORA  Final   Report Status 05/22/2015 FINAL  Final  Fungus Culture with Smear     Status: None   Collection Time: 05/20/15  5:05 PM  Result Value Ref Range Status   Specimen Description SPUTUM  Final   Special Requests Normal  Final   Culture CANDIDA DUBLINIENSIS  Final   Report Status 06/10/2015 FINAL  Final  C difficile quick scan w PCR reflex     Status: None   Collection Time: 05/29/15  4:50 PM  Result Value Ref Range Status   C Diff antigen NEGATIVE NEGATIVE  Final   C Diff toxin NEGATIVE NEGATIVE Final   C Diff interpretation Negative for C. difficile  Final  Urine culture     Status: None   Collection Time: 05/29/15  6:19 PM  Result Value Ref Range Status   Specimen Description URINE, RANDOM  Final   Special Requests NONE  Final   Culture >=100,000 COLONIES/mL CANDIDA ALBICANS  Final   Report Status 06/04/2015 FINAL  Final  Culture, blood (routine x 2)     Status: None (Preliminary result)   Collection Time: 06/12/15 11:54 AM  Result Value Ref Range Status   Specimen Description BLOOD LEFT ASSIST CONTROL  Final   Special Requests BOTTLES DRAWN AEROBIC AND ANAEROBIC  3CC  Final   Culture NO GROWTH < 24 HOURS  Final   Report Status PENDING  Incomplete  Culture, blood (routine x 2)     Status: None (Preliminary result)   Collection Time: 06/12/15 11:55 AM  Result Value Ref Range Status   Specimen Description BLOOD LEFT FA  Final   Special Requests   Final    BOTTLES DRAWN AEROBIC AND ANAEROBIC 2CC ANAERO 3CC AERO   Culture NO GROWTH < 24 HOURS  Final   Report Status PENDING  Incomplete  Urine culture     Status: None (Preliminary result)   Collection Time: 06/12/15 11:55 AM  Result Value Ref Range Status   Specimen Description URINE, CATHETERIZED  Final   Special Requests Immunocompromised  Final   Culture NO GROWTH < 24 HOURS  Final   Report Status PENDING  Incomplete  Culture, expectorated sputum-assessment     Status: None   Collection Time: 06/12/15  4:44 PM  Result Value Ref Range Status   Specimen Description TRACHEAL ASPIRATE  Final   Special Requests Immunocompromised  Final   Sputum evaluation THIS SPECIMEN IS ACCEPTABLE FOR SPUTUM CULTURE  Final   Report Status 06/12/2015 FINAL  Final  Culture, respiratory (NON-Expectorated)     Status: None (Preliminary result)   Collection Time: 06/12/15  4:44 PM  Result Value Ref Range Status   Specimen Description TRACHEAL ASPIRATE  Final   Special Requests Immunocompromised  Reflexed from [id number]  Final   Gram Stain PENDING  Incomplete   Culture HOLDING FOR POSSIBLE PATHOGEN  Final   Report Status PENDING  Incomplete    Medical History: Past Medical History  Diagnosis Date  . Anxiety   . Arthritis   . COPD (chronic obstructive pulmonary disease) ([place])   . CHF (congestive heart failure) (  [place])   . Hyperlipidemia   . Hypertension   . Diabetes mellitus without complication ([place])   . Chronic kidney disease   . Neuromuscular disorder ([place])     Medications:  Scheduled:  . antiseptic oral rinse  7 mL Mouth Rinse 10 times per day  . budesonide (PULMICORT) nebulizer solution  0.5 mg Nebulization BID  . chlorhexidine gluconate  15 mL Mouth Rinse BID  . famotidine (PEPCID) IV  20 mg Intravenous Q12H  . feeding supplement (PRO-STAT SUGAR FREE 64)  30 mL Per Tube Daily  . feeding supplement (VITAL HIGH PROTEIN)  1,000 mL Per Tube Q24H  . free water  200 mL Per Tube BID  . insulin aspart  2-6 Units Subcutaneous 6 times per day  . ipratropium-albuterol  3 mL Nebulization Q6H  . senna-docusate  1 tablet Oral BID  . sterile water (preservative free)       Infusions:  . fentaNYL infusion INTRAVENOUS 300 mcg/hr (06/13/15 0939)  . midazolam (VERSED) infusion 2 mg/hr (06/13/15 0939)  . norepinephrine 8 mcg/min (06/13/15 1045)    Assessment: Pharmacy consulted to assist in constipation prevention in this 69 y/o F with multiple medical problems who is currently intubated and sedated.  Patient stated on senna-docusate BID  On 11/16.  Plan:  Patient with BM today so will decrease senna-docusate to 1 tab po bid. Will f/u AM.   Ulice Dash, PharmD

## 2015-06-14 LAB — TYPE AND SCREEN
ABO/RH(D): O POS
ANTIBODY SCREEN: NEGATIVE
UNIT DIVISION: 0
Unit division: 0
Unit division: 0

## 2015-06-14 LAB — GLUCOSE, CAPILLARY
Glucose-Capillary: 148 mg/dL — ABNORMAL HIGH (ref 65–99)
Glucose-Capillary: 155 mg/dL — ABNORMAL HIGH (ref 65–99)
Glucose-Capillary: 170 mg/dL — ABNORMAL HIGH (ref 65–99)
Glucose-Capillary: 176 mg/dL — ABNORMAL HIGH (ref 65–99)
Glucose-Capillary: 181 mg/dL — ABNORMAL HIGH (ref 65–99)
Glucose-Capillary: 182 mg/dL — ABNORMAL HIGH (ref 65–99)

## 2015-06-14 LAB — EXPECTORATED SPUTUM ASSESSMENT W GRAM STAIN, RFLX TO RESP C

## 2015-06-14 LAB — BASIC METABOLIC PANEL
Anion gap: 3 — ABNORMAL LOW (ref 5–15)
BUN: 20 mg/dL (ref 6–20)
CALCIUM: 8.4 mg/dL — AB (ref 8.9–10.3)
CO2: 37 mmol/L — AB (ref 22–32)
CREATININE: 0.75 mg/dL (ref 0.44–1.00)
Chloride: 100 mmol/L — ABNORMAL LOW (ref 101–111)
GFR calc non Af Amer: >60 mL/min (ref >60)
Glucose, Bld: 209 mg/dL — ABNORMAL HIGH (ref 65–99)
Potassium: 4.3 mmol/L (ref 3.5–5.1)
Sodium: 140 mmol/L (ref 135–145)

## 2015-06-14 LAB — EXPECTORATED SPUTUM ASSESSMENT W REFEX TO RESP CULTURE

## 2015-06-14 LAB — MAGNESIUM: MAGNESIUM: 1.6 mg/dL — AB (ref 1.7–2.4)

## 2015-06-14 MED ORDER — MAGNESIUM SULFATE 2 GM/50ML IV SOLN
2.0000 g | Freq: Once | INTRAVENOUS | Status: AC
  Administered 2015-06-14: 2 g via INTRAVENOUS
  Filled 2015-06-14: qty 50

## 2015-06-14 NOTE — Progress Notes (Signed)
ELECTROLYTE CONSULT NOTE - INITIAL  Pharmacy Consult for electrolyte monitoring  Allergies  Allergen Reactions  . Atorvastatin Other (See Comments)    Does not remember why she had Intolerance to Lipitor.  . Rosuvastatin Anxiety    Chest tigtness and feeling as if she had heartburn.    Patient Measurements: Height: '5\' 7"'$  (170.2 cm) Weight: 253 lb 8.5 oz (115 kg) IBW/kg (Calculated) : 61.6   Vital Signs: Temp: 99.2 F (37.3 C) (11/19 0415) Temp Source: Oral (11/19 0415) BP: 125/56 mmHg (11/19 0630) Pulse Rate: 115 (11/19 0630) Intake/Output from previous day: 11/18 0701 - 11/19 0700 In: 3046.8 [I.V.:1381.8; NG/GT:1415; IV Piggyback:250] Out: 1761 [Urine:1045; Emesis/NG output:750] Intake/Output from this shift: Total I/O In: 1063.1 [I.V.:643.1; NG/GT:420] Out: 6073 [Urine:1045; Emesis/NG output:750]  Labs:  Recent Labs  06/12/15 0437 06/13/15 0421 06/13/15 1412  WBC 14.0* 11.0 11.7*  HGB 6.9* 7.5* 8.1*  HCT 21.6* 23.6* 24.7*  PLT 110* 110* 108*  APTT  --   --  33  INR  --   --  1.15     Recent Labs  06/12/15 1155 06/13/15 0421 06/13/15 1412 06/14/15 0557  NA  --  133* 141 140  K  --  3.2* 4.4 4.3  CL  --  96* 101 100*  CO2  --  32 35* 37*  GLUCOSE  --  445* 197* 209*  BUN  --  19 21* 20  CREATININE  --  0.72 0.69 0.75  CALCIUM  --  7.5* 8.4* 8.4*  MG 1.6*  --  1.4* 1.6*  PHOS 3.2  --  2.8  --    Estimated Creatinine Clearance: 89.4 mL/min (by C-G formula based on Cr of 0.75).    Recent Labs  06/13/15 1939 06/13/15 2341 06/14/15 0418  GLUCAP 182* 169* 181*    Medical History: Past Medical History  Diagnosis Date  . Anxiety   . Arthritis   . COPD (chronic obstructive pulmonary disease) ([place])   . CHF (congestive heart failure) ([place])   . Hyperlipidemia   . Hypertension   . Diabetes mellitus without complication ([place])   . Chronic kidney disease   . 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 Pharmacy consulted for monitoring and managing electrolytes.  Phos: 2.8, Mag: 1.4, K: 4.4  Plan: Potassium replaced with 4 runs IV this AM. Will order Magnesium Sulfate 2g IV once. Will f/u am labs.   11/19 AM electrolytes WNL except Mg 1.6.  2 grams magnesium sulfate IV x1 ordered. Recheck in AM.  Sim Boast, PharmD, BCPS  06/14/2015

## 2015-06-14 NOTE — Progress Notes (Signed)
MEDICATION RELATED CONSULT NOTE - Follow Up  Pharmacy Consult for Constipation Prevention   Allergies  Allergen Reactions  . Atorvastatin Other (See Comments)    Does not remember why she had Intolerance to Lipitor.  . Rosuvastatin Anxiety    Chest tigtness and feeling as if she had heartburn.    Patient Measurements: Height: '5\' 7"'$  (170.2 cm) Weight: 253 lb 8.5 oz (115 kg) IBW/kg (Calculated) : 61.6   Vital Signs: Temp: 98.7 F (37.1 C) (11/19 0828) Temp Source: Axillary (11/19 0828) BP: 119/59 mmHg (11/19 1200) Pulse Rate: 103 (11/19 1200) Intake/Output from previous day: 11/18 0701 - 11/19 0700 In: 3046.8 [I.V.:1381.8; NG/GT:1415; IV Piggyback:250] Out: 2440 [NUUVO:5366; Emesis/NG output:750] Intake/Output from this shift: Total I/O In: 150 [IV Piggyback:150] Out: -   Labs:  Recent Labs  06/12/15 0437 06/12/15 1155 06/13/15 0421 06/13/15 1412 06/14/15 0557  WBC 14.0*  --  11.0 11.7*  --   HGB 6.9*  --  7.5* 8.1*  --   HCT 21.6*  --  23.6* 24.7*  --   PLT 110*  --  110* 108*  --   APTT  --   --   --  33  --   CREATININE  --   --  0.72 0.69 0.75  MG  --  1.6*  --  1.4* 1.6*  PHOS  --  3.2  --  2.8  --    Estimated Creatinine Clearance: 89.4 mL/min (by C-G formula based on Cr of 0.75).   Microbiology: Recent Results (from the past 720 hour(s))  MRSA PCR Screening     Status: None   Collection Time: 05/03/2015 12:28 AM  Result Value Ref Range Status   MRSA by PCR NEGATIVE NEGATIVE Final    Comment:        The GeneXpert MRSA Assay (FDA approved for NASAL specimens only), is one component of a comprehensive MRSA colonization surveillance program. It is not intended to diagnose MRSA infection nor to guide or monitor treatment for MRSA infections.   Culture, blood (routine x 2)     Status: None   Collection Time: 05/07/2015  1:13 AM  Result Value Ref Range Status   Specimen Description BLOOD RIGHT HAND  Final   Special Requests BOTTLES DRAWN AEROBIC  AND ANAEROBIC 3CC  Final   Culture NO GROWTH 5 DAYS  Final   Report Status 05/24/2015 FINAL  Final  Culture, blood (routine x 2)     Status: None   Collection Time: 05/16/2015  1:13 AM  Result Value Ref Range Status   Specimen Description BLOOD RIGHT ASSIST CONTROL  Final   Special Requests BOTTLES DRAWN AEROBIC AND ANAEROBIC 4CC  Final   Culture NO GROWTH 5 DAYS  Final   Report Status 05/24/2015 FINAL  Final  Culture, expectorated sputum-assessment     Status: None   Collection Time: 05/20/15  2:00 PM  Result Value Ref Range Status   Specimen Description EXPECTORATED SPUTUM  Final   Special Requests NONE  Final   Sputum evaluation THIS SPECIMEN IS ACCEPTABLE FOR SPUTUM CULTURE  Final   Report Status 05/20/2015 FINAL  Final  Culture, respiratory (NON-Expectorated)     Status: None   Collection Time: 05/20/15  2:00 PM  Result Value Ref Range Status   Specimen Description EXPECTORATED SPUTUM  Final   Special Requests NONE Reflexed from [id number]  Final   Gram Stain   Final    FEW WBC SEEN FEW [place] -  70-80% WBCS    Culture APPEARS TO BE NORMAL FLORA  Final   Report Status 05/22/2015 FINAL  Final  Culture, bal-quantitative     Status: None   Collection Time: 05/20/15  5:05 PM  Result Value Ref Range Status   Specimen Description BRONCHIAL ALVEOLAR LAVAGE  Final   Special Requests Normal  Final   Gram Stain   Final    MODERATE WBC SEEN RARE GRAM POSITIVE RODS RARE GRAM POSITIVE COCCI IN CHAINS GOOD SPECIMEN - 80-90% WBCS    Culture APPEARS TO BE NORMAL FLORA  Final   Report Status 05/22/2015 FINAL  Final  Fungus Culture with Smear     Status: None   Collection Time: 05/20/15  5:05 PM  Result Value Ref Range Status   Specimen Description SPUTUM  Final   Special Requests Normal  Final   Culture CANDIDA DUBLINIENSIS  Final   Report Status 06/10/2015 FINAL  Final  C difficile quick scan w PCR reflex     Status: None   Collection Time: 05/29/15  4:50 PM   Result Value Ref Range Status   C Diff antigen NEGATIVE NEGATIVE Final   C Diff toxin NEGATIVE NEGATIVE Final   C Diff interpretation Negative for C. difficile  Final  Urine culture     Status: None   Collection Time: 05/29/15  6:19 PM  Result Value Ref Range Status   Specimen Description URINE, RANDOM  Final   Special Requests NONE  Final   Culture >=100,000 COLONIES/mL CANDIDA ALBICANS  Final   Report Status 06/04/2015 FINAL  Final  Culture, blood (routine x 2)     Status: None (Preliminary result)   Collection Time: 06/12/15 11:54 AM  Result Value Ref Range Status   Specimen Description BLOOD LEFT ASSIST CONTROL  Final   Special Requests BOTTLES DRAWN AEROBIC AND ANAEROBIC  3CC  Final   Culture NO GROWTH 2 DAYS  Final   Report Status PENDING  Incomplete  Culture, blood (routine x 2)     Status: None (Preliminary result)   Collection Time: 06/12/15 11:55 AM  Result Value Ref Range Status   Specimen Description BLOOD LEFT FA  Final   Special Requests   Final    BOTTLES DRAWN AEROBIC AND ANAEROBIC 2CC ANAERO 3CC AERO   Culture NO GROWTH 2 DAYS  Final   Report Status PENDING  Incomplete  Urine culture     Status: None (Preliminary result)   Collection Time: 06/12/15 11:55 AM  Result Value Ref Range Status   Specimen Description URINE, CATHETERIZED  Final   Special Requests Immunocompromised  Final   Culture   Final    10,000 COLONIES/mL YEAST IDENTIFICATION TO FOLLOW    Report Status PENDING  Incomplete  Culture, expectorated sputum-assessment     Status: None   Collection Time: 06/12/15  4:44 PM  Result Value Ref Range Status   Specimen Description TRACHEAL ASPIRATE  Final   Special Requests Immunocompromised  Final   Sputum evaluation THIS SPECIMEN IS ACCEPTABLE FOR SPUTUM CULTURE  Final   Report Status 06/12/2015 FINAL  Final  Culture, respiratory (NON-Expectorated)     Status: None (Preliminary result)   Collection Time: 06/12/15  4:44 PM  Result Value Ref Range  Status   Specimen Description TRACHEAL ASPIRATE  Final   Special Requests Immunocompromised Reflexed from [id number]  Final   Gram Stain PENDING  Incomplete   Culture HOLDING FOR POSSIBLE PATHOGEN  Final   Report Status PENDING  Incomplete  Medical History: Past Medical History  Diagnosis Date  . Anxiety   . Arthritis   . COPD (chronic obstructive pulmonary disease) ([place])   . CHF (congestive heart failure) ([place])   . Hyperlipidemia   . Hypertension   . Diabetes mellitus without complication ([place])   . Chronic kidney disease   . Neuromuscular disorder ([place])     Medications:  Scheduled:  . antiseptic oral rinse  7 mL Mouth Rinse 10 times per day  . budesonide (PULMICORT) nebulizer solution  0.5 mg Nebulization BID  . chlorhexidine gluconate  15 mL Mouth Rinse BID  . famotidine (PEPCID) IV  20 mg Intravenous Q12H  . feeding supplement (PRO-STAT SUGAR FREE 64)  30 mL Per Tube Daily  . feeding supplement (VITAL HIGH PROTEIN)  1,000 mL Per Tube Q24H  . free water  200 mL Per Tube BID  . insulin aspart  2-6 Units Subcutaneous 6 times per day  . ipratropium-albuterol  3 mL Nebulization Q6H  . senna-docusate  1 tablet Oral BID   Infusions:  . fentaNYL infusion INTRAVENOUS 250 mcg/hr (06/14/15 1429)  . midazolam (VERSED) infusion 2 mg/hr (06/14/15 0600)  . norepinephrine 3 mcg/min (06/14/15 1208)    Assessment: Pharmacy consulted to assist in constipation prevention in this 69 y/o F with multiple medical problems who is currently intubated and sedated.  Patient stated on senna-docusate BID  On 11/16.  Plan:  Patient with BM today 11/18 so will decrease senna-docusate to 1 tab po bid. Will f/u AM.   11/19: Continue to follow.  Chinita Greenland PharmD Clinical Pharmacist 06/14/2015

## 2015-06-14 NOTE — Progress Notes (Signed)
[facility] Progess Note    ASSESSMENT/PLAN   PLAN/REC:  1.Respiratory Failure due to right lung atelectasis from large right lung mass, biopsy confirmed small cell lung cancer. Status post 1 round of chemotherapy with mild improvement, however, has failed extubation 1.  -continue Full MV support, has failed extubation, tracheostomy planned for early next week. -continue Bronchodilator Therapy -Wean Fio2 and PEEP as tolerated Will need trach for survival-planned for Monday, chest tube planned as well.  Cont empiric nebulized bronchodilators-  2.Lung mass On chemo therapy -next cycle next week  3.COPD-continue bd therapy and streroids ---------------------------------------   ----------------------------------------   Name: Emily Pratt MRN: [id number] DOB: 1946-04-28    ADMISSION DATE:  05/18/2015    CHIEF COMPLAINT:  Dyspnea    SUBJECTIVE:     Review of Systems:  Pt currently on the ventilator, can not provide history or review of systems.   VITAL SIGNS: Temp:  [98 F (36.7 C)-99.7 F (37.6 C)] 98.7 F (37.1 C) (11/19 0828) Pulse Rate:  [32-164] 106 (11/19 0900) Resp:  [7-25] 14 (11/19 0900) BP: (94-151)/(40-83) 135/56 mmHg (11/19 0900) SpO2:  [78 %-99 %] 98 % (11/19 0900) FiO2 (%):  [60 %-100 %] 60 % (11/19 1130) Weight:  [253 lb 8.5 oz (115 kg)] 253 lb 8.5 oz (115 kg) (11/19 0400) HEMODYNAMICS:   VENTILATOR SETTINGS: Vent Mode:  [-] PRVC FiO2 (%):  [60 %-100 %] 60 % Set Rate:  [15 bmp] 15 bmp Vt Set:  [500 mL] 500 mL PEEP:  [5 cmH20-8 cmH20] 8 cmH20 Plateau Pressure:  [20 cmH20-21 cmH20] 21 cmH20 INTAKE / OUTPUT:  Intake/Output Summary (Last 24 hours) at 06/14/15 1145 Last data filed at 06/14/15 0857  Gross per 24 hour  Intake 2189.14 ml  Output   1795 ml  Net 394.14 ml    PHYSICAL EXAMINATION: Physical Examination:   VS: BP 135/56 mmHg  Pulse 106  Temp(Src) 98.7 F (37.1 C) (Axillary)  Resp 14  Ht '5\' 7"'$   (1.702 m)  Wt 253 lb 8.5 oz (115 kg)  BMI 39.70 kg/m2  SpO2 98%  General Appearance: No distress  Neuro:without focal findings, mental status reduced, patient currently sedated. HEENT: PERRLA, EOM intact. Pulmonary: normal breath sounds on left side reduced on right side.  CardiovascularNormal S1,S2.  No m/r/g.   Abdomen: Benign, Soft, non-tender. Renal:  No costovertebral tenderness  GU:  Not performed at this time. Endocrine: No evident thyromegaly. Skin:   warm, no rashes, no ecchymosis  Extremities: normal, no cyanosis, clubbing.   LABS:   LABORATORY PANEL:   CBC  Recent Labs Lab 06/13/15 1412  WBC 11.7*  HGB 8.1*  HCT 24.7*  PLT 108*    Chemistries   Recent Labs Lab 06/08/15 0517  06/13/15 1412 06/14/15 0557  NA 144  < > 141 140  K 3.9  < > 4.4 4.3  CL 108  < > 101 100*  CO2 28  < > 35* 37*  GLUCOSE 126*  < > 197* 209*  BUN 16  < > 21* 20  CREATININE 0.73  < > 0.69 0.75  CALCIUM 8.4*  < > 8.4* 8.4*  MG 1.7  < > 1.4* 1.6*  PHOS 3.3  < > 2.8  --   AST 22  --   --   --   ALT 45  --   --   --   ALKPHOS 113  --   --   --   BILITOT 1.0  --   --   --   < > =  values in this interval not displayed.   Recent Labs Lab 06/13/15 1602 06/13/15 1939 06/13/15 2341 06/14/15 0418 06/14/15 0656 06/14/15 1118  GLUCAP 167* 182* 169* 181* 176* 182*    Recent Labs Lab 06/07/15 1748 06/07/15 2245 06/08/15 2335  PHART 7.35 7.37 7.32*  PCO2ART 60* 57* 62*  PO2ART 59* 101 68*    Recent Labs Lab 06/08/15 0517  AST 22  ALT 45  ALKPHOS 113  BILITOT 1.0  ALBUMIN 2.1*    Cardiac Enzymes No results for input(s): TROPONINI in the last 168 hours.  RADIOLOGY:  No results found.     --Marda Stalker, MD.   [facility]   Patricia Pesa, M.D.  Vilinda Boehringer, M.D.  Merton Border, M.D

## 2015-06-14 NOTE — Progress Notes (Signed)
[facility] [place] NAME: Emily Pratt    MR#:  [id number]  DATE OF BIRTH:  23-Apr-1946  SUBJECTIVE:  Patient remains on the ventilator, on FiO2 of 70%, on Versed and fentanyl drip   REVIEW OF SYSTEMS:   Unavailable to provide due to patient being sedated DRUG ALLERGIES:   Allergies  Allergen Reactions  . Atorvastatin Other (See Comments)    Does not remember why she had Intolerance to Lipitor.  . Rosuvastatin Anxiety    Chest tigtness and feeling as if she had heartburn.    VITALS:  Blood pressure 119/59, pulse 103, temperature 98.7 F (37.1 C), temperature source Axillary, resp. rate 26, height '5\' 7"'$  (1.702 m), weight 115 kg (253 lb 8.5 oz), SpO2 93 %.  PHYSICAL EXAMINATION:  GENERAL:  69 y.o.-year-old patient lying in the bed, somnolent, intubated critically ill  EYES: Pupils equal, round, reactive to light and accommodation. No scleral icterus.  HEENT: Head atraumatic, normocephalic. ETT in place NECK:  Supple, no jugular venous distention. No thyroid enlargement, no tenderness.  LUNGS:  markedly diminished Air entrance on the right, especially in the right lower lobe , relatively good air entrance on the left, some rhonchi on the , dull percussion on the right  CARDIOVASCULAR: S1, S2 normal. No murmurs, rubs, or gallops.  ABDOMEN: Soft, minimally tender diffusely, distended. Bowel sounds present. No organomegaly or mass.  EXTREMITIES: +1 pedal edema, no cyanosis, or clubbing.  NEUROLOGIC: CN 2-12able to assess as patient is asleep,  unable to follow commands PSYCHIATRIC: , intubated, sedated  SKIN: Sacral decubitus ulcer    LABORATORY PANEL:   CBC  Recent Labs Lab 06/13/15 1412  WBC 11.7*  HGB 8.1*  HCT 24.7*  PLT 108*   ------------------------------------------------------------------------------------------------------------------  Chemistries   Recent Labs Lab 06/08/15 0517  06/14/15 0557  NA 144   < > 140  K 3.9  < > 4.3  CL 108  < > 100*  CO2 28  < > 37*  GLUCOSE 126*  < > 209*  BUN 16  < > 20  CREATININE 0.73  < > 0.75  CALCIUM 8.4*  < > 8.4*  MG 1.7  < > 1.6*  AST 22  --   --   ALT 45  --   --   ALKPHOS 113  --   --   BILITOT 1.0  --   --   < > = values in this interval not displayed. ------------------------------------------------------------------------------------------------------------------  Cardiac Enzymes No results for input(s): TROPONINI in the last 168 hours. ------------------------------------------------------------------------------------------------------------------  RADIOLOGY:  No results found.  EKG:   Orders placed or performed during the hospital encounter of 05/03/2015  . EKG 12-Lead  . EKG 12-Lead    ASSESSMENT AND PLAN:   1. Acute on chronic respiratory failure with hypoxia and Hypercapnia,  - Multifactorial  -due to large right-sided pleural effusion/ CHF/ COPD / mass- SCLC - Pulmonology and oncology is following,  - extubated  November 12 th, 2016,  reintubated 11.14.16 by DR Mortimer Fries .  Pleurx catheter this Monday   2. Acute Congestive heart failure: Mixed systolic and diastolic  - Echo 10/27 shows ejection fraction 30 -35% with abnormal filling - Due to hypotension Lasix discontinued   3. Acute on chronic kidney injury:  - Due to ATN, hypovolemia and hypotension, resolved. - Nephrology signed off. Renal function stable   4. Essential hypertension, now hypotensive, likely due to medications,, unlikely infection ,all cultures are  negative so far , on Levophed as needed antihypertensives on hold   5. Diabetes mellitus type 2: controlled. Holding home  medications    6.  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   - Oncology initiated chemotherapy ,  PICC line for chemotherapy is placed 05/26/2015-chest [place]: moderate right side pleural effusion, which is not seen on chest x-ray or CT scan of the chest.  CT chest: Revealed Interval response to therapy,  improved aeration to the right upper lobe,  decrease in size of central obstructing right perihilar lesion,  supraclavicular Adenopathy. Consulted ENT to place TT ,  Dr. Tami Ribas , we'll proceed to place it on Monday, November 17 , Dr. Faith Rogue is planning to place Pleurx catheter to the right chest to manage malignant right pleural effusion November 17    7. Postobstructive pneumonia versus atelectasis - Blood cultures negative to date, respiratory, BAL, AFB normal flora - Status post treatment currently off any antibiotics   8. Arrhythmia, SVT versus paroxysmal atrial fibrillation during bronchoscopy, now in sinus rhythm, continue to monitor the rhythm  9. Pancytopenia continue to monitor hemoglobin is stable platelets are low but no indication for transfusion ,  10. Hypernatremiacontinue free water   11. UTI, Ur cx showed yeast,  that is post treatment   12. Ileus, monitor  . 13 Hypokalemia. KCl. Resolved  14.  Hypomagnesemia.Replace     CODE STATUS: Full code  TOTAL CRITICAL CARE TIME TAKING CARE OF THIS PATIENT  35 . Minutes     Dustin Flock M.D on 06/14/2015 at 2:57 PM  Between 7am to 6pm - Pager - [phone] After 6pm go to [email or web address] - password [email or web address] [facility]  Office  [phone]  CC: Primary care physician; Dagoberto Ligas, MD

## 2015-06-14 NOTE — Plan of Care (Signed)
Problem: ICU Phase Progression Outcomes Goal: O2 sats trending toward baseline Outcome: Not Progressing Pt with high anxiety, FIO2 increased to 100% until pt settles. Versed bolus given. Goal: Hemodynamically stable Outcome: Not Progressing Cont Levo gtt for MAP >60 Goal: Pain controlled with appropriate interventions Outcome: Progressing Cont Fentanyl gtt at 265mg/hour Goal: Voiding-avoid urinary catheter unless indicated Outcome: Not Progressing Cont foley d/t critical status.

## 2015-06-14 NOTE — Consult Note (Signed)
Reason for Consult: shortness of breath congestion edema cellulitis Referring Physician:  Dr. Nash Shearer is an 69 y.o. female.  HPI:  Patient presented with abscess cellulitis of the buttock area and there is evaluation was noted he was possibly in heart failure with abdominal swelling and peripheral edema. Patient denied any chest pain but complained of shortness of breath and dyspnea the patient may have evidence of pulmonary hypertension. Patient has history of smoking and COPD is not on inhalers but has persistent shortness of breath and dyspnea. Patient is admitted and placed on antibiotics supplemental oxygen inhalers with significant anemia cardiology was recommended for further evaluation because of heart failure and right-sided heart failure and possible pulmonary hypertension  Past Medical History  Diagnosis Date  . Anxiety   . Arthritis   . COPD (chronic obstructive pulmonary disease) ([place])   . CHF (congestive heart failure) ([place])   . Hyperlipidemia   . Hypertension   . Diabetes mellitus without complication ([place])   . Chronic kidney disease   . Neuromuscular disorder [facility])     Past Surgical History  Procedure Laterality Date  . Joint replacement Bilateral 2006    both knees  . Ankle surgery      Family History  Problem Relation Age of Onset  . Diabetes Mother   . Cancer Father   . Diabetes Sister   . Stroke Sister     Social History:  reports that she has been smoking Cigarettes.  She does not have any smokeless tobacco history on file. She reports that she does not drink alcohol or use illicit drugs.  Allergies:  Allergies  Allergen Reactions  . Atorvastatin Other (See Comments)    Does not remember why she had Intolerance to Lipitor.  . Rosuvastatin Anxiety    Chest tigtness and feeling as if she had heartburn.    Medications: I have reviewed the patient's current medications.  Results for orders placed or performed during the hospital  encounter of 05/13/2015 (from the past 48 hour(s))  Glucose, capillary     Status: Abnormal   Collection Time: 06/12/15 11:56 AM  Result Value Ref Range   Glucose-Capillary 170 (H) 65 - 99 mg/dL  Glucose, capillary     Status: Abnormal   Collection Time: 06/12/15  4:05 PM  Result Value Ref Range   Glucose-Capillary 202 (H) 65 - 99 mg/dL  Culture, expectorated sputum-assessment     Status: None   Collection Time: 06/12/15  4:44 PM  Result Value Ref Range   Specimen Description TRACHEAL ASPIRATE    Special Requests Immunocompromised    Sputum evaluation THIS SPECIMEN IS ACCEPTABLE FOR SPUTUM CULTURE    Report Status 06/12/2015 FINAL   Culture, respiratory (NON-Expectorated)     Status: None (Preliminary result)   Collection Time: 06/12/15  4:44 PM  Result Value Ref Range   Specimen Description TRACHEAL ASPIRATE    Special Requests Immunocompromised Reflexed from [id number]    Gram Stain PENDING    Culture HOLDING FOR POSSIBLE PATHOGEN    Report Status PENDING   Glucose, capillary     Status: Abnormal   Collection Time: 06/12/15  8:07 PM  Result Value Ref Range   Glucose-Capillary 180 (H) 65 - 99 mg/dL  Glucose, capillary     Status: Abnormal   Collection Time: 06/13/15 12:06 AM  Result Value Ref Range   Glucose-Capillary 184 (H) 65 - 99 mg/dL  Glucose, capillary     Status: Abnormal   Collection Time:  06/13/15  4:01 AM  Result Value Ref Range   Glucose-Capillary 194 (H) 65 - 99 mg/dL  Basic metabolic panel     Status: Abnormal   Collection Time: 06/13/15  4:21 AM  Result Value Ref Range   Sodium 133 (L) 135 - 145 mmol/L   Potassium 3.2 (L) 3.5 - 5.1 mmol/L   Chloride 96 (L) 101 - 111 mmol/L   CO2 32 22 - 32 mmol/L   Glucose, Bld 445 (H) 65 - 99 mg/dL   BUN 19 6 - 20 mg/dL   Creatinine, Ser 0.72 0.44 - 1.00 mg/dL   Calcium 7.5 (L) 8.9 - 10.3 mg/dL   GFR calc non Af Amer >60 >60 mL/min   GFR calc Af Amer >60 >60 mL/min    Comment: (NOTE) The eGFR has been calculated using the  CKD EPI equation. This calculation has not been validated in all clinical situations. eGFR's persistently <60 mL/min signify possible Chronic Kidney Disease.    Anion gap 5 5 - 15  CBC     Status: Abnormal   Collection Time: 06/13/15  4:21 AM  Result Value Ref Range   WBC 11.0 3.6 - 11.0 K/uL   RBC 2.58 (L) 3.80 - 5.20 MIL/uL   Hemoglobin 7.5 (L) 12.0 - 16.0 g/dL   HCT 23.6 (L) 35.0 - 47.0 %   MCV 91.3 80.0 - 100.0 fL   MCH 29.1 26.0 - 34.0 pg   MCHC 31.9 (L) 32.0 - 36.0 g/dL   RDW 17.4 (H) 11.5 - 14.5 %   Platelets 110 (L) 150 - 440 K/uL  Glucose, capillary     Status: Abnormal   Collection Time: 06/13/15  7:30 AM  Result Value Ref Range   Glucose-Capillary 195 (H) 65 - 99 mg/dL  Glucose, capillary     Status: Abnormal   Collection Time: 06/13/15 11:49 AM  Result Value Ref Range   Glucose-Capillary 177 (H) 65 - 99 mg/dL  Basic metabolic panel     Status: Abnormal   Collection Time: 06/13/15  2:12 PM  Result Value Ref Range   Sodium 141 135 - 145 mmol/L    Comment: RESULTS CHECKED CONFIRMED BY RECOLLECT    Potassium 4.4 3.5 - 5.1 mmol/L   Chloride 101 101 - 111 mmol/L   CO2 35 (H) 22 - 32 mmol/L   Glucose, Bld 197 (H) 65 - 99 mg/dL   BUN 21 (H) 6 - 20 mg/dL   Creatinine, Ser 0.69 0.44 - 1.00 mg/dL   Calcium 8.4 (L) 8.9 - 10.3 mg/dL   GFR calc non Af Amer >60 >60 mL/min   GFR calc Af Amer >60 >60 mL/min    Comment: (NOTE) The eGFR has been calculated using the CKD EPI equation. This calculation has not been validated in all clinical situations. eGFR's persistently <60 mL/min signify possible Chronic Kidney Disease.    Anion gap 5 5 - 15  Magnesium     Status: Abnormal   Collection Time: 06/13/15  2:12 PM  Result Value Ref Range   Magnesium 1.4 (L) 1.7 - 2.4 mg/dL  Phosphorus     Status: None   Collection Time: 06/13/15  2:12 PM  Result Value Ref Range   Phosphorus 2.8 2.5 - 4.6 mg/dL  CBC     Status: Abnormal   Collection Time: 06/13/15  2:12 PM  Result Value  Ref Range   WBC 11.7 (H) 3.6 - 11.0 K/uL   RBC 2.73 (L) 3.80 - 5.20 MIL/uL  Hemoglobin 8.1 (L) 12.0 - 16.0 g/dL   HCT 24.7 (L) 35.0 - 47.0 %   MCV 90.6 80.0 - 100.0 fL   MCH 29.6 26.0 - 34.0 pg   MCHC 32.7 32.0 - 36.0 g/dL   RDW 18.1 (H) 11.5 - 14.5 %   Platelets 108 (L) 150 - 440 K/uL  APTT     Status: None   Collection Time: 06/13/15  2:12 PM  Result Value Ref Range   aPTT 33 24 - 36 seconds  Protime-INR     Status: None   Collection Time: 06/13/15  2:12 PM  Result Value Ref Range   Prothrombin Time 14.9 11.4 - 15.0 seconds   INR 1.15   Glucose, capillary     Status: Abnormal   Collection Time: 06/13/15  4:02 PM  Result Value Ref Range   Glucose-Capillary 167 (H) 65 - 99 mg/dL  Glucose, capillary     Status: Abnormal   Collection Time: 06/13/15  7:39 PM  Result Value Ref Range   Glucose-Capillary 182 (H) 65 - 99 mg/dL  Glucose, capillary     Status: Abnormal   Collection Time: 06/13/15 11:41 PM  Result Value Ref Range   Glucose-Capillary 169 (H) 65 - 99 mg/dL  Glucose, capillary     Status: Abnormal   Collection Time: 06/14/15  4:18 AM  Result Value Ref Range   Glucose-Capillary 181 (H) 65 - 99 mg/dL  Basic metabolic panel     Status: Abnormal   Collection Time: 06/14/15  5:57 AM  Result Value Ref Range   Sodium 140 135 - 145 mmol/L   Potassium 4.3 3.5 - 5.1 mmol/L   Chloride 100 (L) 101 - 111 mmol/L   CO2 37 (H) 22 - 32 mmol/L   Glucose, Bld 209 (H) 65 - 99 mg/dL   BUN 20 6 - 20 mg/dL   Creatinine, Ser 0.75 0.44 - 1.00 mg/dL   Calcium 8.4 (L) 8.9 - 10.3 mg/dL   GFR calc non Af Amer >60 >60 mL/min   GFR calc Af Amer >60 >60 mL/min    Comment: (NOTE) The eGFR has been calculated using the CKD EPI equation. This calculation has not been validated in all clinical situations. eGFR's persistently <60 mL/min signify possible Chronic Kidney Disease.    Anion gap 3 (L) 5 - 15  Magnesium     Status: Abnormal   Collection Time: 06/14/15  5:57 AM  Result Value Ref  Range   Magnesium 1.6 (L) 1.7 - 2.4 mg/dL  Glucose, capillary     Status: Abnormal   Collection Time: 06/14/15  6:56 AM  Result Value Ref Range   Glucose-Capillary 176 (H) 65 - 99 mg/dL  Glucose, capillary     Status: Abnormal   Collection Time: 06/14/15 11:18 AM  Result Value Ref Range   Glucose-Capillary 182 (H) 65 - 99 mg/dL    No results found.  Review of Systems  Constitutional: Positive for malaise/fatigue.  HENT: Positive for congestion.   Eyes: Negative.   Respiratory: Positive for shortness of breath.   Cardiovascular: Positive for leg swelling.  Gastrointestinal: Negative.   Genitourinary: Negative.   Musculoskeletal: Negative.   Skin: Negative.   Neurological: Positive for weakness.  Endo/Heme/Allergies: Negative.   Psychiatric/Behavioral: The patient is nervous/anxious.    Blood pressure 135/56, pulse 106, temperature 98.7 F (37.1 C), temperature source Axillary, resp. rate 14, height 5' 7"  (1.702 m), weight 115 kg (253 lb 8.5 oz), SpO2 98 %. Physical Exam  Nursing note and vitals reviewed. Constitutional: She is oriented to person, place, and time. She appears well-developed and well-nourished.  HENT:  Head: Normocephalic and atraumatic.  Eyes: Conjunctivae and EOM are normal. Pupils are equal, round, and reactive to light.  Neck: Normal range of motion. Neck supple.  Cardiovascular: Normal rate, regular rhythm and normal heart sounds.   Respiratory: Effort normal and breath sounds normal.  GI: Soft. Bowel sounds are normal.  Musculoskeletal: Normal range of motion.  Neurological: She is alert and oriented to person, place, and time. She has normal reflexes.  Skin: Skin is warm and dry.    Assessment/Plan:  right sided heart failure  congestive heart failure  COPD with acute exacerbation  hypercapnia  Respiratory failure  lung mass  acute on chronic renal insufficiency  pleural effusion  abscess cellulitis  diabetic neuropathy  hypoxemia  major  affective disorder  history of smoking  anemia  hyperlipidemia . PLAN  continue broad-spectrum antibiotic therapy for abscess cellulitis  diuretic therapy for right-sided heart failure  consider support stockings for peripheral edema  supplemental oxygen for hypoxemia  consider  GI evaluation for anemia  agree with echocardiogram for assessment LV function  inhalers for COPD symptoms  advised patient to quit smoking  evaluation of lung mass consider oncology   [place] Ursua D. 06/14/2015, 11:55 AM

## 2015-06-15 ENCOUNTER — Inpatient Hospital Stay: Payer: Medicare Other

## 2015-06-15 LAB — BLOOD GAS, ARTERIAL
Acid-Base Excess: 13.5 mmol/L — ABNORMAL HIGH (ref 0.0–3.0)
BICARBONATE: 42 meq/L — AB (ref 21.0–28.0)
FIO2: 0.6
MECHANICAL RATE: 15
MECHVT: 500 mL
O2 Saturation: 94.4 %
PEEP: 8 cmH2O
PH ART: 7.38 (ref 7.350–7.450)
Patient temperature: 37
pCO2 arterial: 71 mmHg (ref 32.0–48.0)
pO2, Arterial: 74 mmHg — ABNORMAL LOW (ref 83.0–108.0)

## 2015-06-15 LAB — CBC
HEMATOCRIT: 23.5 % — AB (ref 35.0–47.0)
HEMOGLOBIN: 7.6 g/dL — AB (ref 12.0–16.0)
MCH: 29.5 pg (ref 26.0–34.0)
MCHC: 32.3 g/dL (ref 32.0–36.0)
MCV: 91.4 fL (ref 80.0–100.0)
Platelets: 114 10*3/uL — ABNORMAL LOW (ref 150–440)
RBC: 2.58 MIL/uL — ABNORMAL LOW (ref 3.80–5.20)
RDW: 18 % — ABNORMAL HIGH (ref 11.5–14.5)
WBC: 9.5 10*3/uL (ref 3.6–11.0)

## 2015-06-15 LAB — BASIC METABOLIC PANEL
Anion gap: 3 — ABNORMAL LOW (ref 5–15)
BUN: 21 mg/dL — AB (ref 6–20)
CHLORIDE: 100 mmol/L — AB (ref 101–111)
CO2: 37 mmol/L — ABNORMAL HIGH (ref 22–32)
Calcium: 8.2 mg/dL — ABNORMAL LOW (ref 8.9–10.3)
Creatinine, Ser: 0.73 mg/dL (ref 0.44–1.00)
GFR calc Af Amer: >60 mL/min (ref >60)
GFR calc non Af Amer: >60 mL/min (ref >60)
GLUCOSE: 218 mg/dL — AB (ref 65–99)
POTASSIUM: 4.6 mmol/L (ref 3.5–5.1)
Sodium: 140 mmol/L (ref 135–145)

## 2015-06-15 LAB — MAGNESIUM: Magnesium: 1.7 mg/dL (ref 1.7–2.4)

## 2015-06-15 LAB — URINE CULTURE

## 2015-06-15 LAB — GLUCOSE, CAPILLARY
Glucose-Capillary: 194 mg/dL — ABNORMAL HIGH (ref 65–99)
Glucose-Capillary: 185 mg/dL — ABNORMAL HIGH (ref 65–99)
Glucose-Capillary: 211 mg/dL — ABNORMAL HIGH (ref 65–99)
Glucose-Capillary: 183 mg/dL — ABNORMAL HIGH (ref 65–99)
Glucose-Capillary: 183 mg/dL — ABNORMAL HIGH (ref 65–99)

## 2015-06-15 LAB — CULTURE, RESPIRATORY W GRAM STAIN: Culture: NORMAL

## 2015-06-15 LAB — CULTURE, RESPIRATORY

## 2015-06-15 NOTE — Progress Notes (Signed)
ELECTROLYTE CONSULT NOTE - INITIAL  Pharmacy Consult for electrolyte monitoring  Allergies  Allergen Reactions  . Atorvastatin Other (See Comments)    Does not remember why she had Intolerance to Lipitor.  . Rosuvastatin Anxiety    Chest tigtness and feeling as if she had heartburn.    Patient Measurements: Height: '5\' 7"'$  (170.2 cm) Weight: 265 lb 3.4 oz (120.3 kg) IBW/kg (Calculated) : 61.6   Vital Signs: Temp: 98.7 F (37.1 C) (11/20 0400) Temp Source: Axillary (11/20 0400) BP: 121/50 mmHg (11/20 0500) Pulse Rate: 105 (11/20 0500) Intake/Output from previous day: 11/19 0701 - 11/20 0700 In: 2371.9 [I.V.:738.9; UG/QB:1694; IV Piggyback:150] Out: 1400 [HWTUU:8280] Intake/Output from this shift: Total I/O In: 934.4 [I.V.:231.4; NG/GT:703] Out: 400 [Urine:400]  Labs:  Recent Labs  06/13/15 0421 06/13/15 1412 06/15/15 0520  WBC 11.0 11.7* 9.5  HGB 7.5* 8.1* 7.6*  HCT 23.6* 24.7* 23.5*  PLT 110* 108* 114*  APTT  --  33  --   INR  --  1.15  --      Recent Labs  06/12/15 1155  06/13/15 1412 06/14/15 0557 06/15/15 0520  NA  --   < > 141 140 140  K  --   < > 4.4 4.3 4.6  CL  --   < > 101 100* 100*  CO2  --   < > 35* 37* 37*  GLUCOSE  --   < > 197* 209* 218*  BUN  --   < > 21* 20 21*  CREATININE  --   < > 0.69 0.75 0.73  CALCIUM  --   < > 8.4* 8.4* 8.2*  MG 1.6*  --  1.4* 1.6* 1.7  PHOS 3.2  --  2.8  --   --   < > = values in this interval not displayed. Estimated Creatinine Clearance: 91.7 mL/min (by C-G formula based on Cr of 0.73).    Recent Labs  06/14/15 1930 06/14/15 2341 06/15/15 0352  GLUCAP 155* 170* 194*    Medical History: Past Medical History  Diagnosis Date  . Anxiety   . Arthritis   . COPD (chronic obstructive pulmonary disease) ([place])   . CHF (congestive heart failure) ([place])   . Hyperlipidemia   . Hypertension   . Diabetes mellitus without complication ([place])   . Chronic kidney disease   . Neuromuscular disorder [facility])      Assessment: 69 year old female with SCLC and currently intubated. Pharmacy consulted for monitoring and managing electrolytes.  Phos: 2.8, Mag: 1.4, K: 4.4  Plan: Potassium replaced with 4 runs IV this AM. Will order Magnesium Sulfate 2g IV once. Will f/u am labs.   11/19 AM electrolytes WNL except Mg 1.6.  2 grams magnesium sulfate IV x1 ordered. Recheck in AM.  11/20 AM electrolytes WNL. Recheck tomorrow.  Sim Boast, PharmD, BCPS  06/15/2015

## 2015-06-15 NOTE — Consult Note (Signed)
[place], Str[place] [id number] 08/31/1945 Dustin Flock, MD  Reason for Consult: Evaluate for trach  HPI: Patient has been vent dependent with chronic lung problems secondary to stage IV lung cancer. She failed extubation again several days ago and is tentatively scheduled for a trach tomorrow. She has had low platelet levels however they've been coming up and her blood pressure has been low but stable. She is scheduled to have a Pleurx catheter placed at same time because of fluid collecting in her right lung.  Allergies:  Allergies  Allergen Reactions  . Atorvastatin Other (See Comments)    Does not remember why she had Intolerance to Lipitor.  . Rosuvastatin Anxiety    Chest tigtness and feeling as if she had heartburn.    ROS: Review of systems normal other than 12 systems except per HPI.  PMH:  Past Medical History  Diagnosis Date  . Anxiety   . Arthritis   . COPD (chronic obstructive pulmonary disease) ([place])   . CHF (congestive heart failure) ([place])   . Hyperlipidemia   . Hypertension   . Diabetes mellitus without complication ([place])   . Chronic kidney disease   . Neuromuscular disorder ([facility])     FH:  Family History  Problem Relation Age of Onset  . Diabetes Mother   . Cancer Father   . Diabetes Sister   . Stroke Sister     SH:  Social History   Social History  . Marital Status: Married    Spouse Name: N/A  . Number of Children: N/A  . Years of Education: N/A   Occupational History  . Not on file.   Social History Main Topics  . Smoking status: Current Every Day Smoker    Types: Cigarettes  . Smokeless tobacco: Not on file     Comment: 0.5 pack /day  . Alcohol Use: No  . Drug Use: No  . Sexual Activity: Not on file   Other Topics Concern  . Not on file   Social History Narrative   Lives at home with husband.   Has a walker    PSH:  Past Surgical History  Procedure Laterality Date  . Joint replacement Bilateral 2006    both knees  . Ankle  surgery      Physical  Exam: Patient is unresponsive secondary to sedation. She has an oral tracheal tube in an oral gastric tube as well. The very thick fat neck with a large double chin. Landmarks are difficult to feel in the neck. There is no bruising noted. She is stable on the vent.   A/P: She is vent dependent and 2 very serious condition. She is unable to be decannulated and needs a tracheostomy tube for a stable airway. This will be performed tomorrow in the operating room under general anesthesia. She is very high risk for complications, including death, secondary to her underlying medical problems. Dr. Faith Rogue will be placed in a Pleurx catheter into her right long at the same time. She has very low hemoglobin levels then continued to slowly drop. She may be a candidate for transfusing again today prior to surgery tomorrow. Her platelet levels are stable at 114.   Shepherd Finnan H 06/15/2015 11:30 AM

## 2015-06-15 NOTE — Progress Notes (Signed)
[facility] NAME: Emily Pratt    MR#:  [id number]  DATE OF BIRTH:  1946-06-15  SUBJECTIVE:  Patient's FiO2 is 50% today, is on fentanyl drip and Versed drip   REVIEW OF SYSTEMS:   Unavailable to provide due to patient being sedated DRUG ALLERGIES:   Allergies  Allergen Reactions  . Atorvastatin Other (See Comments)    Does not remember why she had Intolerance to Lipitor.  . Rosuvastatin Anxiety    Chest tigtness and feeling as if she had heartburn.    VITALS:  Blood pressure 121/51, pulse 106, temperature 98.7 F (37.1 C), temperature source Axillary, resp. rate 15, height '5\' 7"'$  (1.702 m), weight 120.3 kg (265 lb 3.4 oz), SpO2 97 %.  PHYSICAL EXAMINATION:  GENERAL:  69 y.o.-year-old patient lying in the bed,, intubated critically ill  EYES: Pupils equal, round, reactive to light and accommodation. No scleral icterus.  HEENT: Head atraumatic, normocephalic. ETT in place NECK:  Supple, no jugular venous distention. No thyroid enlargement, no tenderness.  LUNGS:  Diminished breath sounds throughout both lungs no rhonchi or crackles currently intubated CARDIOVASCULAR: S1, S2 normal. No murmurs, rubs, or gallops.  ABDOMEN: Soft, minimally tender diffusely, distended. Bowel sounds present. No organomegaly or mass.  EXTREMITIES: + 2 pedal edema, no cyanosis, or clubbing.  NEUROLOGIC: Sedated  PSYCHIATRIC: , intubated, sedated  SKIN: Sacral decubitus ulcer    LABORATORY PANEL:   CBC  Recent Labs Lab 06/15/15 0520  WBC 9.5  HGB 7.6*  HCT 23.5*  PLT 114*   ------------------------------------------------------------------------------------------------------------------  Chemistries   Recent Labs Lab 06/15/15 0520  NA 140  K 4.6  CL 100*  CO2 37*  GLUCOSE 218*  BUN 21*  CREATININE 0.73  CALCIUM 8.2*  MG 1.7    ------------------------------------------------------------------------------------------------------------------  Cardiac Enzymes No results for input(s): TROPONINI in the last 168 hours. ------------------------------------------------------------------------------------------------------------------  RADIOLOGY:  Dg Chest 1 View  06/15/2015  CLINICAL DATA:  Dyspnea. History of COPD, congestive heart failure, hypertension, chronic kidney disease. EXAM: CHEST 1 VIEW COMPARISON:  06/12/2015 FINDINGS: Endotracheal tube, enteric catheter, right PICC line terminating in the expected location of proximal superior vena cava are stable. Cardiomediastinal silhouette is borderline enlarged. Mediastinal contours appear intact. The ureter demonstrates atherosclerotic calcifications of the arch. There is no evidence of pneumothorax. There is slight improvement of the aeration of the right lung with large right pleural effusion. Left lower lobe atelectasis is noted. Osseous structures are without acute abnormality. Soft tissues are grossly normal. IMPRESSION: Slight improvement of the aeration of the right lung, with persistent large right pleural effusion, an diffuse haziness of the visualized lung parenchyma. Atelectasis of the left lower lobe. Electronically Signed   By: Fidela Salisbury M.D.   On: 06/15/2015 09:12    EKG:   Orders placed or performed during the hospital encounter of 04/26/2015  . EKG 12-Lead  . EKG 12-Lead    ASSESSMENT AND PLAN:   1. Acute on chronic respiratory failure with hypoxia and Hypercapnia,  - Multifactorial  -due to large right-sided pleural effusion/ CHF/ COPD / mass- SCLC - Pulmonology and oncology is following,  - extubated  November 12 th, 2016,  reintubated 11.14.16 by DR Mortimer Fries .  Pleurx catheter this Monday - Prognosis poor   2. Acute Congestive heart failure: Mixed systolic and diastolic  - Echo 73/41 shows ejection fraction 30 -35% with abnormal  filling - Due to hypotension Lasix discontinued   3. Acute on  chronic kidney injury:  - Due to ATN, hypovolemia and hypotension, resolved. - Nephrology signed off. Renal function stable   4. Essential hypertension, now hypotensive, due to ventilation and sedating medications, continue Levophed as needed to maintain adequate map   5. Diabetes mellitus type 2: controlled. Sliding scale insulin    6.  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   - Oncology initiated chemotherapy ,  PICC line for chemotherapy is placed 05/26/2015-chest [place]: moderate right side pleural effusion, which is not seen on chest x-ray or CT scan of the chest.  CT chest: Revealed Interval response to therapy,  improved aeration to the right upper lobe,  decrease in size of central obstructing right perihilar lesion,  supraclavicular Adenopathy. Consulted ENT to place TT ,  Dr. Tami Ribas , we'll proceed to place it on Monday, November 17 , Dr. Faith Rogue is planning to place Pleurx catheter to the right chest to manage malignant right pleural effusion November 17   7. Postobstructive pneumonia versus atelectasis - Blood cultures negative to date, respiratory, BAL, AFB normal flora - Status post treatment currently off any antibiotics   8. Arrhythmia, SVT versus paroxysmal atrial fibrillation during bronchoscopy, now in sinus rhythm, continue to monitor the rhythm  9. Pancytopenia continue to monitor hemoglobin is stable platelets are low but no indication for transfusion ,  10. Hypernatremiacontinue free water   11. UTI, Ur cx showed yeast,  that is post treatment  12. Ileus, monitor  . 13 Hypokalemia. KCl. Resolved  14.  Hypomagnesemia.Replace     CODE STATUS: Full  code  TOTAL CRITICAL CARE TIME TAKING CARE OF THIS PATIENT  32 minutes  Dustin Flock M.D on 06/15/2015 at 2:08 PM  Between 7am to 6pm - Pager - [phone] After 6pm go to [email or web address] - password [email or web address] [facility]  Office  [phone]  CC: Primary care physician; Dagoberto Ligas, MD

## 2015-06-15 NOTE — Progress Notes (Signed)
[facility] Progess Note    ASSESSMENT/PLAN   PLAN/REC:  1.Respiratory Failure due to right lung atelectasis from large right lung mass, biopsy confirmed small cell lung cancer. Status post 1 round of chemotherapy with mild improvement, however, has failed extubation 1.  -continue Full MV support, has failed extubation, tracheostomy planned for Monday. -continue Bronchodilator Therapy -Wean Fio2 and PEEP as tolerated Will need trach for survival-planned for Monday, chest tube planned as well.  Cont empiric nebulized bronchodilators-  2.Lung mass On chemo therapy -next cycle next week  3.COPD-continue bd therapy and streroids ---------------------------------------   ----------------------------------------   Name: Emily Pratt MRN: [id number] DOB: 1945/10/02    ADMISSION DATE:  05/02/2015    CHIEF COMPLAINT:  Dyspnea    SUBJECTIVE:  Patient is currently intubated on the ventilator and cannot provide a review of systems or history of presenting illness.   Review of Systems:  Pt currently on the ventilator, can not provide history or review of systems.   VITAL SIGNS: Temp:  [97.6 F (36.4 C)-98.8 F (37.1 C)] 98.7 F (37.1 C) (11/20 0400) Pulse Rate:  [100-144] 106 (11/20 1100) Resp:  [14-28] 15 (11/20 1100) BP: (113-140)/(49-113) 121/51 mmHg (11/20 1100) SpO2:  [93 %-99 %] 97 % (11/20 1100) FiO2 (%):  [50 %-65 %] 50 % (11/20 0853) Weight:  [120.3 kg (265 lb 3.4 oz)] 120.3 kg (265 lb 3.4 oz) (11/20 0414) HEMODYNAMICS:   VENTILATOR SETTINGS: Vent Mode:  [-] PRVC FiO2 (%):  [50 %-65 %] 50 % Set Rate:  [15 bmp] 15 bmp Vt Set:  [500 mL] 500 mL PEEP:  [8 cmH20] 8 cmH20 Plateau Pressure:  [21 cmH20] 21 cmH20 INTAKE / OUTPUT:  Intake/Output Summary (Last 24 hours) at 06/15/15 1145 Last data filed at 06/15/15 0600  Gross per 24 hour  Intake 2449.55 ml  Output   1800 ml  Net 649.55 ml    PHYSICAL EXAMINATION: Physical  Examination:   VS: BP 121/51 mmHg  Pulse 106  Temp(Src) 98.7 F (37.1 C) (Axillary)  Resp 15  Ht '5\' 7"'$  (1.702 m)  Wt 120.3 kg (265 lb 3.4 oz)  BMI 41.53 kg/m2  SpO2 97%  General Appearance: No distress  Neuro:without focal findings, mental status reduced, patient currently sedated. HEENT: PERRLA, EOM intact. Pulmonary: normal breath sounds on left side reduced on right side.  CardiovascularNormal S1,S2.  No m/r/g.   Abdomen: Benign, Soft, non-tender. Renal:  No costovertebral tenderness  GU:  Not performed at this time. Endocrine: No evident thyromegaly. Skin:   warm, no rashes, no ecchymosis  Extremities: normal, no cyanosis, clubbing.   LABS:   LABORATORY PANEL:   CBC  Recent Labs Lab 06/15/15 0520  WBC 9.5  HGB 7.6*  HCT 23.5*  PLT 114*    Chemistries   Recent Labs Lab 06/13/15 1412  06/15/15 0520  NA 141  < > 140  K 4.4  < > 4.6  CL 101  < > 100*  CO2 35*  < > 37*  GLUCOSE 197*  < > 218*  BUN 21*  < > 21*  CREATININE 0.69  < > 0.73  CALCIUM 8.4*  < > 8.2*  MG 1.4*  < > 1.7  PHOS 2.8  --   --   < > = values in this interval not displayed.   Recent Labs Lab 06/14/15 1118 06/14/15 1611 06/14/15 1930 06/14/15 2341 06/15/15 0352 06/15/15 0744  GLUCAP 182* 148* 155* 170* 194* 185*  Recent Labs Lab 06/08/15 2335 06/15/15 0321  PHART 7.32* 7.38  PCO2ART 62* 71*  PO2ART 68* 74*   No results for input(s): AST, ALT, ALKPHOS, BILITOT, ALBUMIN in the last 168 hours.  Cardiac Enzymes No results for input(s): TROPONINI in the last 168 hours.  RADIOLOGY:  Dg Chest 1 View  06/15/2015  CLINICAL DATA:  Dyspnea. History of COPD, congestive heart failure, hypertension, chronic kidney disease. EXAM: CHEST 1 VIEW COMPARISON:  06/12/2015 FINDINGS: Endotracheal tube, enteric catheter, right PICC line terminating in the expected location of proximal superior vena cava are stable. Cardiomediastinal silhouette is borderline enlarged. Mediastinal  contours appear intact. The ureter demonstrates atherosclerotic calcifications of the arch. There is no evidence of pneumothorax. There is slight improvement of the aeration of the right lung with large right pleural effusion. Left lower lobe atelectasis is noted. Osseous structures are without acute abnormality. Soft tissues are grossly normal. IMPRESSION: Slight improvement of the aeration of the right lung, with persistent large right pleural effusion, an diffuse haziness of the visualized lung parenchyma. Atelectasis of the left lower lobe. Electronically Signed   By: Fidela Salisbury M.D.   On: 06/15/2015 09:12       --Marda Stalker, MD.   [facility]   Patricia Pesa, M.D.  Vilinda Boehringer, M.D.  Merton Border, M.D

## 2015-06-15 NOTE — Progress Notes (Signed)
MEDICATION RELATED CONSULT NOTE - Follow Up  Pharmacy Consult for Constipation Prevention   Allergies  Allergen Reactions  . Atorvastatin Other (See Comments)    Does not remember why she had Intolerance to Lipitor.  . Rosuvastatin Anxiety    Chest tigtness and feeling as if she had heartburn.    Patient Measurements: Height: '5\' 7"'$  (170.2 cm) Weight: 265 lb 3.4 oz (120.3 kg) IBW/kg (Calculated) : 61.6   Vital Signs: Temp: 98.7 F (37.1 C) (11/20 0400) Temp Source: Axillary (11/20 0400) BP: 122/50 mmHg (11/20 1500) Pulse Rate: 107 (11/20 1500) Intake/Output from previous day: 11/19 0701 - 11/20 0700 In: 2599.6 [I.V.:809.6; NG/GT:1640; IV Piggyback:150] Out: 1800 [Urine:1800] Intake/Output from this shift: Total I/O In: 730 [NG/GT:680; IV Piggyback:50] Out: -   Labs:  Recent Labs  06/13/15 0421 06/13/15 1412 06/14/15 0557 06/15/15 0520  WBC 11.0 11.7*  --  9.5  HGB 7.5* 8.1*  --  7.6*  HCT 23.6* 24.7*  --  23.5*  PLT 110* 108*  --  114*  APTT  --  33  --   --   CREATININE 0.72 0.69 0.75 0.73  MG  --  1.4* 1.6* 1.7  PHOS  --  2.8  --   --    Estimated Creatinine Clearance: 91.7 mL/min (by C-G formula based on Cr of 0.73).   Microbiology: Recent Results (from the past 720 hour(s))  MRSA PCR Screening     Status: None   Collection Time: 05/03/2015 12:28 AM  Result Value Ref Range Status   MRSA by PCR NEGATIVE NEGATIVE Final    Comment:        The GeneXpert MRSA Assay (FDA approved for NASAL specimens only), is one component of a comprehensive MRSA colonization surveillance program. It is not intended to diagnose MRSA infection nor to guide or monitor treatment for MRSA infections.   Culture, blood (routine x 2)     Status: None   Collection Time: 05/11/2015  1:13 AM  Result Value Ref Range Status   Specimen Description BLOOD RIGHT HAND  Final   Special Requests BOTTLES DRAWN AEROBIC AND ANAEROBIC 3CC  Final   Culture NO GROWTH 5 DAYS  Final   Report  Status 05/24/2015 FINAL  Final  Culture, blood (routine x 2)     Status: None   Collection Time: 05/18/2015  1:13 AM  Result Value Ref Range Status   Specimen Description BLOOD RIGHT ASSIST CONTROL  Final   Special Requests BOTTLES DRAWN AEROBIC AND ANAEROBIC 4CC  Final   Culture NO GROWTH 5 DAYS  Final   Report Status 05/24/2015 FINAL  Final  Culture, expectorated sputum-assessment     Status: None   Collection Time: 05/20/15  2:00 PM  Result Value Ref Range Status   Specimen Description EXPECTORATED SPUTUM  Final   Special Requests NONE  Final   Sputum evaluation THIS SPECIMEN IS ACCEPTABLE FOR SPUTUM CULTURE  Final   Report Status 05/20/2015 FINAL  Final  Culture, respiratory (NON-Expectorated)     Status: None   Collection Time: 05/20/15  2:00 PM  Result Value Ref Range Status   Specimen Description EXPECTORATED SPUTUM  Final   Special Requests NONE Reflexed from [id number]  Final   Gram Stain   Final    FEW WBC SEEN FEW GRAM POSITIVE RODS FAIR SPECIMEN - 70-80% WBCS    Culture APPEARS TO BE NORMAL FLORA  Final   Report Status 05/22/2015 FINAL  Final  Culture, bal-quantitative  Status: None   Collection Time: 05/20/15  5:05 PM  Result Value Ref Range Status   Specimen Description BRONCHIAL ALVEOLAR LAVAGE  Final   Special Requests Normal  Final   Gram Stain   Final    MODERATE WBC SEEN RARE GRAM POSITIVE RODS RARE GRAM POSITIVE COCCI IN CHAINS GOOD SPECIMEN - 80-90% WBCS    Culture APPEARS TO BE NORMAL FLORA  Final   Report Status 05/22/2015 FINAL  Final  Fungus Culture with Smear     Status: None   Collection Time: 05/20/15  5:05 PM  Result Value Ref Range Status   Specimen Description SPUTUM  Final   Special Requests Normal  Final   Culture CANDIDA DUBLINIENSIS  Final   Report Status 06/10/2015 FINAL  Final  C difficile quick scan w PCR reflex     Status: None   Collection Time: 05/29/15  4:50 PM  Result Value Ref Range Status   C Diff antigen NEGATIVE NEGATIVE  Final   C Diff toxin NEGATIVE NEGATIVE Final   C Diff interpretation Negative for C. difficile  Final  Urine culture     Status: None   Collection Time: 05/29/15  6:19 PM  Result Value Ref Range Status   Specimen Description URINE, RANDOM  Final   Special Requests NONE  Final   Culture >=100,000 COLONIES/mL CANDIDA ALBICANS  Final   Report Status 06/04/2015 FINAL  Final  Culture, blood (routine x 2)     Status: None (Preliminary result)   Collection Time: 06/12/15 11:54 AM  Result Value Ref Range Status   Specimen Description BLOOD LEFT ASSIST CONTROL  Final   Special Requests BOTTLES DRAWN AEROBIC AND ANAEROBIC  3CC  Final   Culture NO GROWTH 3 DAYS  Final   Report Status PENDING  Incomplete  Culture, blood (routine x 2)     Status: None (Preliminary result)   Collection Time: 06/12/15 11:55 AM  Result Value Ref Range Status   Specimen Description BLOOD LEFT FA  Final   Special Requests   Final    BOTTLES DRAWN AEROBIC AND ANAEROBIC 2CC ANAERO 3CC AERO   Culture NO GROWTH 3 DAYS  Final   Report Status PENDING  Incomplete  Urine culture     Status: None   Collection Time: 06/12/15 11:55 AM  Result Value Ref Range Status   Specimen Description URINE, CATHETERIZED  Final   Special Requests Immunocompromised  Final   Culture 10,000 COLONIES/mL CANDIDA ALBICANS  Final   Report Status 06/15/2015 FINAL  Final  Culture, expectorated sputum-assessment     Status: None   Collection Time: 06/12/15  4:44 PM  Result Value Ref Range Status   Specimen Description TRACHEAL ASPIRATE  Final   Special Requests Immunocompromised  Final   Sputum evaluation THIS SPECIMEN IS ACCEPTABLE FOR SPUTUM CULTURE  Final   Report Status 06/12/2015 FINAL  Final  Culture, respiratory (NON-Expectorated)     Status: None   Collection Time: 06/12/15  4:44 PM  Result Value Ref Range Status   Specimen Description TRACHEAL ASPIRATE  Final   Special Requests Immunocompromised Reflexed from [id number]  Final   Gram  Stain   Final    GOOD SPECIMEN - 80-90% WBCS MODERATE WBC SEEN MODERATE GRAM VARIABLE ROD FEW GRAM POSITIVE COCCI IN PAIRS MANY GRAM POSITIVE RODS    Culture Consistent with normal respiratory flora.  Final   Report Status 06/15/2015 FINAL  Final  Culture, expectorated sputum-assessment     Status: None  Collection Time: 06/14/15  5:19 PM  Result Value Ref Range Status   Specimen Description SPUTUM  Final   Special Requests NONE  Final   Sputum evaluation THIS SPECIMEN IS ACCEPTABLE FOR SPUTUM CULTURE  Final   Report Status 06/14/2015 FINAL  Final  Culture, respiratory (NON-Expectorated)     Status: None (Preliminary result)   Collection Time: 06/14/15  5:19 PM  Result Value Ref Range Status   Specimen Description SPUTUM  Final   Special Requests NONE Reflexed from [id number]  Final   Gram Stain PENDING  Incomplete   Culture TOO YOUNG TO READ  Final   Report Status PENDING  Incomplete    Medical History: Past Medical History  Diagnosis Date  . Anxiety   . Arthritis   . COPD (chronic obstructive pulmonary disease) ([place])   . CHF (congestive heart failure) ([place])   . Hyperlipidemia   . Hypertension   . Diabetes mellitus without complication ([place])   . Chronic kidney disease   . Neuromuscular disorder ([place])     Medications:  Scheduled:  . antiseptic oral rinse  7 mL Mouth Rinse 10 times per day  . budesonide (PULMICORT) nebulizer solution  0.5 mg Nebulization BID  . chlorhexidine gluconate  15 mL Mouth Rinse BID  . famotidine (PEPCID) IV  20 mg Intravenous Q12H  . feeding supplement (PRO-STAT SUGAR FREE 64)  30 mL Per Tube Daily  . feeding supplement (VITAL HIGH PROTEIN)  1,000 mL Per Tube Q24H  . free water  200 mL Per Tube BID  . insulin aspart  2-6 Units Subcutaneous 6 times per day  . ipratropium-albuterol  3 mL Nebulization Q6H  . senna-docusate  1 tablet Oral BID   Infusions:  . fentaNYL infusion INTRAVENOUS 250 mcg/hr (06/15/15 1400)  . midazolam (VERSED) infusion  4 mg/hr (06/15/15 1400)  . norepinephrine Stopped (06/14/15 1400)    Assessment: Pharmacy consulted to assist in constipation prevention in this 69 y/o F with multiple medical problems who is currently intubated and sedated.  Patient stated on senna-docusate BID  On 11/16.  Plan:  Patient with BM today 11/18 so will decrease senna-docusate to 1 tab po bid. Will f/u AM.   11/19: Continue to follow. 11/20:  No BM noted.  Chinita Greenland PharmD Clinical Pharmacist 06/15/2015 3:42 PM

## 2015-06-16 ENCOUNTER — Inpatient Hospital Stay: Payer: Medicare Other

## 2015-06-16 ENCOUNTER — Encounter: Admission: EM | Disposition: E | Payer: Self-pay | Source: Home / Self Care | Attending: Pulmonary Disease

## 2015-06-16 ENCOUNTER — Encounter: Payer: Self-pay | Admitting: Otolaryngology

## 2015-06-16 ENCOUNTER — Inpatient Hospital Stay: Payer: Medicare Other | Admitting: *Deleted

## 2015-06-16 HISTORY — PX: TRACHEOSTOMY TUBE PLACEMENT: SHX814

## 2015-06-16 LAB — BASIC METABOLIC PANEL
Anion gap: 4 — ABNORMAL LOW (ref 5–15)
BUN: 25 mg/dL — AB (ref 6–20)
CHLORIDE: 101 mmol/L (ref 101–111)
CO2: 37 mmol/L — ABNORMAL HIGH (ref 22–32)
Calcium: 8.1 mg/dL — ABNORMAL LOW (ref 8.9–10.3)
Creatinine, Ser: 0.73 mg/dL (ref 0.44–1.00)
GFR calc Af Amer: >60 mL/min (ref >60)
GFR calc non Af Amer: >60 mL/min (ref >60)
Glucose, Bld: 201 mg/dL — ABNORMAL HIGH (ref 65–99)
POTASSIUM: 4.5 mmol/L (ref 3.5–5.1)
SODIUM: 142 mmol/L (ref 135–145)

## 2015-06-16 LAB — CBC
HCT: 22.7 % — ABNORMAL LOW (ref 35.0–47.0)
HEMOGLOBIN: 7.4 g/dL — AB (ref 12.0–16.0)
MCH: 29.6 pg (ref 26.0–34.0)
MCHC: 32.5 g/dL (ref 32.0–36.0)
MCV: 91 fL (ref 80.0–100.0)
Platelets: 123 10*3/uL — ABNORMAL LOW (ref 150–440)
RBC: 2.49 MIL/uL — AB (ref 3.80–5.20)
RDW: 18.2 % — ABNORMAL HIGH (ref 11.5–14.5)
WBC: 9.7 10*3/uL (ref 3.6–11.0)

## 2015-06-16 LAB — CULTURE, RESPIRATORY W GRAM STAIN

## 2015-06-16 LAB — GLUCOSE, CAPILLARY
Glucose-Capillary: 192 mg/dL — ABNORMAL HIGH (ref 65–99)
Glucose-Capillary: 199 mg/dL — ABNORMAL HIGH (ref 65–99)
Glucose-Capillary: 200 mg/dL — ABNORMAL HIGH (ref 65–99)
Glucose-Capillary: 203 mg/dL — ABNORMAL HIGH (ref 65–99)
Glucose-Capillary: 220 mg/dL — ABNORMAL HIGH (ref 65–99)
Glucose-Capillary: 230 mg/dL — ABNORMAL HIGH (ref 65–99)
Glucose-Capillary: 249 mg/dL — ABNORMAL HIGH (ref 65–99)

## 2015-06-16 LAB — PHOSPHORUS: PHOSPHORUS: 2.6 mg/dL (ref 2.5–4.6)

## 2015-06-16 LAB — CULTURE, RESPIRATORY: CULTURE: NORMAL

## 2015-06-16 LAB — MAGNESIUM: MAGNESIUM: 1.5 mg/dL — AB (ref 1.7–2.4)

## 2015-06-16 SURGERY — CREATION, TRACHEOSTOMY
Anesthesia: General | Laterality: Right | Wound class: Clean Contaminated

## 2015-06-16 MED ORDER — FENTANYL CITRATE (PF) 100 MCG/2ML IJ SOLN
INTRAMUSCULAR | Status: AC
  Administered 2015-06-16: 100 ug via INTRAVENOUS
  Filled 2015-06-16: qty 2

## 2015-06-16 MED ORDER — LIDOCAINE-EPINEPHRINE (PF) 1 %-1:200000 IJ SOLN
INTRAMUSCULAR | Status: DC | PRN
  Administered 2015-06-16: 3 mL

## 2015-06-16 MED ORDER — SENNOSIDES-DOCUSATE SODIUM 8.6-50 MG PO TABS
1.0000 | ORAL_TABLET | Freq: Two times a day (BID) | ORAL | Status: DC
  Administered 2015-06-16 – 2015-06-22 (×11): 1
  Filled 2015-06-16 (×12): qty 1

## 2015-06-16 MED ORDER — FENTANYL 2500MCG IN NS 250ML (10MCG/ML) PREMIX INFUSION
25.0000 ug/h | INTRAVENOUS | Status: AC
  Administered 2015-06-16: 150 ug/h via INTRAVENOUS
  Administered 2015-06-16: 15 ug/h via INTRAVENOUS
  Administered 2015-06-17: 300 ug/h via INTRAVENOUS
  Filled 2015-06-16 (×2): qty 250

## 2015-06-16 MED ORDER — LORAZEPAM 2 MG/ML IJ SOLN
0.5000 mg | INTRAMUSCULAR | Status: DC | PRN
  Administered 2015-06-17 – 2015-06-20 (×12): 1 mg via INTRAVENOUS
  Filled 2015-06-16 (×12): qty 1

## 2015-06-16 MED ORDER — MAGNESIUM SULFATE 2 GM/50ML IV SOLN
2.0000 g | Freq: Once | INTRAVENOUS | Status: AC
  Administered 2015-06-16: 2 g via INTRAVENOUS
  Filled 2015-06-16: qty 50

## 2015-06-16 MED ORDER — MIDAZOLAM HCL 2 MG/2ML IJ SOLN
INTRAMUSCULAR | Status: AC
  Administered 2015-06-16: 2 mg via INTRAVENOUS
  Filled 2015-06-16: qty 2

## 2015-06-16 MED ORDER — FAMOTIDINE 20 MG PO TABS
20.0000 mg | ORAL_TABLET | Freq: Two times a day (BID) | ORAL | Status: DC
  Administered 2015-06-16 – 2015-07-13 (×53): 20 mg
  Filled 2015-06-16 (×55): qty 1

## 2015-06-16 MED ORDER — EPHEDRINE SULFATE 50 MG/ML IJ SOLN
INTRAMUSCULAR | Status: DC | PRN
  Administered 2015-06-16: 10 mg via INTRAVENOUS

## 2015-06-16 MED ORDER — SODIUM CHLORIDE 0.9 % IV SOLN
1.0000 mg/h | INTRAVENOUS | Status: DC
  Filled 2015-06-16: qty 10

## 2015-06-16 MED ORDER — FENTANYL CITRATE (PF) 100 MCG/2ML IJ SOLN
25.0000 ug | INTRAMUSCULAR | Status: DC | PRN
  Administered 2015-06-16 – 2015-06-21 (×16): 100 ug via INTRAVENOUS
  Administered 2015-06-21: 25 ug via INTRAVENOUS
  Administered 2015-06-22 – 2015-06-25 (×6): 50 ug via INTRAVENOUS
  Administered 2015-06-26 – 2015-06-28 (×2): 100 ug via INTRAVENOUS
  Administered 2015-06-29: 50 ug via INTRAVENOUS
  Administered 2015-06-30 – 2015-07-03 (×7): 100 ug via INTRAVENOUS
  Administered 2015-07-04 (×2): 50 ug via INTRAVENOUS
  Administered 2015-07-04: 100 ug via INTRAVENOUS
  Administered 2015-07-04 (×2): 50 ug via INTRAVENOUS
  Administered 2015-07-05: 100 ug via INTRAVENOUS
  Filled 2015-06-16 (×39): qty 2

## 2015-06-16 MED ORDER — INSULIN ASPART 100 UNIT/ML ~~LOC~~ SOLN
0.0000 [IU] | SUBCUTANEOUS | Status: DC
  Administered 2015-06-16: 4 [IU] via SUBCUTANEOUS
  Administered 2015-06-16 (×2): 7 [IU] via SUBCUTANEOUS
  Administered 2015-06-17 (×5): 4 [IU] via SUBCUTANEOUS
  Administered 2015-06-17: 7 [IU] via SUBCUTANEOUS
  Administered 2015-06-18 (×7): 4 [IU] via SUBCUTANEOUS
  Administered 2015-06-19 – 2015-06-22 (×12): 3 [IU] via SUBCUTANEOUS
  Administered 2015-06-22: 4 [IU] via SUBCUTANEOUS
  Administered 2015-06-23 (×2): 3 [IU] via SUBCUTANEOUS
  Administered 2015-06-23: 7 [IU] via SUBCUTANEOUS
  Administered 2015-06-23 – 2015-06-24 (×4): 3 [IU] via SUBCUTANEOUS
  Administered 2015-06-24 (×2): 4 [IU] via SUBCUTANEOUS
  Administered 2015-06-25: 3 [IU] via SUBCUTANEOUS
  Administered 2015-06-25 (×2): 4 [IU] via SUBCUTANEOUS
  Administered 2015-06-25: 7 [IU] via SUBCUTANEOUS
  Administered 2015-06-25 (×2): 4 [IU] via SUBCUTANEOUS
  Administered 2015-06-26 (×3): 3 [IU] via SUBCUTANEOUS
  Administered 2015-06-26: 4 [IU] via SUBCUTANEOUS
  Administered 2015-06-26: 7 [IU] via SUBCUTANEOUS
  Administered 2015-06-26 – 2015-06-27 (×2): 4 [IU] via SUBCUTANEOUS
  Administered 2015-06-27 (×2): 3 [IU] via SUBCUTANEOUS
  Administered 2015-06-27 (×3): 4 [IU] via SUBCUTANEOUS
  Administered 2015-06-28: 3 [IU] via SUBCUTANEOUS
  Administered 2015-06-28: 4 [IU] via SUBCUTANEOUS
  Administered 2015-06-28 – 2015-06-29 (×6): 3 [IU] via SUBCUTANEOUS
  Administered 2015-06-29: 4 [IU] via SUBCUTANEOUS
  Administered 2015-06-30 – 2015-07-04 (×10): 3 [IU] via SUBCUTANEOUS
  Administered 2015-07-05: 4 [IU] via SUBCUTANEOUS
  Administered 2015-07-05: 3 [IU] via SUBCUTANEOUS
  Administered 2015-07-06 (×2): 4 [IU] via SUBCUTANEOUS
  Administered 2015-07-06 – 2015-07-08 (×10): 3 [IU] via SUBCUTANEOUS
  Administered 2015-07-09: 2 [IU] via SUBCUTANEOUS
  Administered 2015-07-09 – 2015-07-10 (×4): 3 [IU] via SUBCUTANEOUS
  Administered 2015-07-10 (×3): 4 [IU] via SUBCUTANEOUS
  Administered 2015-07-11 – 2015-07-13 (×12): 3 [IU] via SUBCUTANEOUS
  Administered 2015-07-13: 4 [IU] via SUBCUTANEOUS
  Administered 2015-07-13: 7 [IU] via SUBCUTANEOUS
  Administered 2015-07-13: 3 [IU] via SUBCUTANEOUS
  Administered 2015-07-14: 7 [IU] via SUBCUTANEOUS
  Administered 2015-07-14: 4 [IU] via SUBCUTANEOUS
  Administered 2015-07-14 (×2): 3 [IU] via SUBCUTANEOUS
  Administered 2015-07-14 (×2): 7 [IU] via SUBCUTANEOUS
  Administered 2015-07-15: 3 [IU] via SUBCUTANEOUS
  Filled 2015-06-16 (×2): qty 3
  Filled 2015-06-16: qty 4
  Filled 2015-06-16: qty 3
  Filled 2015-06-16 (×2): qty 4
  Filled 2015-06-16: qty 7
  Filled 2015-06-16: qty 4
  Filled 2015-06-16 (×2): qty 3
  Filled 2015-06-16: qty 4
  Filled 2015-06-16 (×3): qty 3
  Filled 2015-06-16: qty 4
  Filled 2015-06-16: qty 7
  Filled 2015-06-16: qty 3
  Filled 2015-06-16 (×2): qty 4
  Filled 2015-06-16 (×2): qty 3
  Filled 2015-06-16: qty 4
  Filled 2015-06-16 (×4): qty 3
  Filled 2015-06-16: qty 4
  Filled 2015-06-16: qty 7
  Filled 2015-06-16: qty 4
  Filled 2015-06-16 (×4): qty 3
  Filled 2015-06-16: qty 4
  Filled 2015-06-16 (×2): qty 3
  Filled 2015-06-16 (×2): qty 4
  Filled 2015-06-16 (×5): qty 3
  Filled 2015-06-16: qty 4
  Filled 2015-06-16 (×6): qty 3
  Filled 2015-06-16: qty 4
  Filled 2015-06-16: qty 3
  Filled 2015-06-16: qty 4
  Filled 2015-06-16 (×2): qty 3
  Filled 2015-06-16: qty 7
  Filled 2015-06-16: qty 3
  Filled 2015-06-16: qty 4
  Filled 2015-06-16: qty 3
  Filled 2015-06-16: qty 7
  Filled 2015-06-16: qty 3
  Filled 2015-06-16: qty 4
  Filled 2015-06-16: qty 7
  Filled 2015-06-16: qty 4
  Filled 2015-06-16 (×3): qty 3
  Filled 2015-06-16: qty 4
  Filled 2015-06-16: qty 3
  Filled 2015-06-16: qty 4
  Filled 2015-06-16 (×5): qty 3
  Filled 2015-06-16: qty 4
  Filled 2015-06-16 (×2): qty 3
  Filled 2015-06-16: qty 7
  Filled 2015-06-16 (×3): qty 3
  Filled 2015-06-16: qty 4
  Filled 2015-06-16 (×3): qty 3
  Filled 2015-06-16: qty 4
  Filled 2015-06-16 (×2): qty 3
  Filled 2015-06-16: qty 4
  Filled 2015-06-16: qty 3
  Filled 2015-06-16 (×2): qty 4
  Filled 2015-06-16: qty 3
  Filled 2015-06-16: qty 7
  Filled 2015-06-16 (×3): qty 3
  Filled 2015-06-16: qty 7
  Filled 2015-06-16: qty 4
  Filled 2015-06-16: qty 3
  Filled 2015-06-16 (×5): qty 4
  Filled 2015-06-16: qty 3
  Filled 2015-06-16: qty 7
  Filled 2015-06-16: qty 3
  Filled 2015-06-16: qty 7
  Filled 2015-06-16 (×9): qty 3

## 2015-06-16 SURGICAL SUPPLY — 26 items
BLADE SURG 15 STRL LF DISP TIS (BLADE) ×2 IMPLANT
BLADE SURG 15 STRL SS (BLADE) ×2
BLADE SURG SZ11 CARB STEEL (BLADE) ×4 IMPLANT
CANISTER SUCT 1200ML W/VALVE (MISCELLANEOUS) ×8 IMPLANT
GLOVE EXAM LX STRL 7.5 (GLOVE) ×8 IMPLANT
GOWN STRL REUS W/ TWL LRG LVL3 (GOWN DISPOSABLE) ×8 IMPLANT
GOWN STRL REUS W/TWL LRG LVL3 (GOWN DISPOSABLE) ×8
HARMONIC SCALPEL FOCUS (MISCELLANEOUS) ×4 IMPLANT
KIT RM TURNOVER STRD PROC AR (KITS) ×4 IMPLANT
NS IRRIG 500ML POUR BTL (IV SOLUTION) ×4 IMPLANT
PACK BASIN MINOR ARMC (MISCELLANEOUS) ×4 IMPLANT
PACK HEAD/NECK (MISCELLANEOUS) ×4 IMPLANT
PAD GROUND ADULT SPLIT (MISCELLANEOUS) ×8 IMPLANT
SPONGE EXCIL AMD DRAIN 4X4 6P (MISCELLANEOUS) ×4 IMPLANT
SPONGE KITTNER 5P (MISCELLANEOUS) ×4 IMPLANT
SUCTION FRAZIER TIP 10 FR DISP (SUCTIONS) ×8 IMPLANT
SUT ETHILON 2 0 FS 18 (SUTURE) ×4 IMPLANT
SUT SILK 2 0 (SUTURE) ×2
SUT SILK 2-0 18XBRD TIE 12 (SUTURE) ×2 IMPLANT
SUT VIC AB 2-0 SH 27 (SUTURE) ×2
SUT VIC AB 2-0 SH 27XBRD (SUTURE) ×2 IMPLANT
SUT VIC AB 4-0 RB1 27 (SUTURE) ×2
SUT VIC AB 4-0 RB1 27X BRD (SUTURE) ×2 IMPLANT
TUBE TRACH 7.0 EXL PROX CUF (TUBING) ×4 IMPLANT
TUBE TRACH SHILEY  6 DIST  CUF (TUBING) IMPLANT
TUBE TRACH SHILEY 8 DIST CUF (TUBING) IMPLANT

## 2015-06-16 NOTE — Progress Notes (Signed)
Seen after trach tube placement today. RASS -4, not F/C. On vent  Filed Vitals:   06/07/2015 1500 06/19/2015 1600 06/21/2015 1700 06/21/2015 1800  BP: 134/69 148/63 115/57 124/59  Pulse: 120 127 141 144  Temp:  101 F (38.3 C)  99.5 F (37.5 C)  TempSrc:  Oral  Oral  Resp: '21 20 21 22  '$ Height:      Weight:      SpO2: 95% 93% 92% 95%   HEENT WNL Fresh trach tube Diminished BS on R, scattered rhonchi Reg, no M Obese, soft, diminished BS Ext warm, symmetric edema   CMP Latest Ref Rng 05/27/2015 06/15/2015 06/14/2015  Glucose 65 - 99 mg/dL 201(H) 218(H) 209(H)  BUN 6 - 20 mg/dL 25(H) 21(H) 20  Creatinine 0.44 - 1.00 mg/dL 0.73 0.73 0.75  Sodium 135 - 145 mmol/L 142 140 140  Potassium 3.5 - 5.1 mmol/L 4.5 4.6 4.3  Chloride 101 - 111 mmol/L 101 100(L) 100(L)  CO2 22 - 32 mmol/L 37(H) 37(H) 37(H)  Calcium 8.9 - 10.3 mg/dL 8.1(L) 8.2(L) 8.4(L)  Total Protein 6.5 - 8.1 g/dL - - -  Total Bilirubin 0.3 - 1.2 mg/dL - - -  Alkaline Phos 38 - 126 U/L - - -  AST 15 - 41 U/L - - -  ALT 14 - 54 U/L - - -    CBC    Component Value Date/Time   WBC 9.7 06/12/2015 0857   RBC 2.49* 06/07/2015 0857   HGB 7.4* 06/02/2015 0857   HCT 22.7* 06/25/2015 0857   PLT 123* 05/27/2015 0857   MCV 91.0 06/22/2015 0857   MCH 29.6 06/10/2015 0857   MCHC 32.5 05/27/2015 0857   RDW 18.2* 06/15/2015 0857   LYMPHSABS 0.5* 06/03/2015 0539   MONOABS 0.0* 06/03/2015 0539   EOSABS 0.0 06/03/2015 0539   BASOSABS 0.0 06/03/2015 0539    CXR: NSC  IMPRESSION: Prolonged VDRF 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, chemotherapy PSVT - started back on fentanyl gtt this evening. Will start beta blocker 11/22 Anemia Thrombocytopenia - improving   PLAN/REC: Plan/orders adjusted to reflect post trach state - need to wake up and mobilize Wean in PSV<>ATC as tolerated Monitor BMET intermittently Monitor I/Os Correct electrolytes as indicated DVT px: SQ heparin Monitor CBC intermittently Transfuse per  usual ICU guidelines Consider repeat FOB to assess airway patency  Merton Border, MD PCCM service Mobile [phone] Pager [phone]

## 2015-06-16 NOTE — Progress Notes (Signed)
Inpatient Diabetes Program Recommendations  AACE/ADA: New Consensus Statement on Inpatient Glycemic Control (2015)  Target Ranges:  Prepandial:   less than 140 mg/dL      Peak postprandial:   less than 180 mg/dL (1-2 hours)      Critically ill patients:  140 - 180 mg/dL   Review of Glycemic Control:  Results for SIDNI, FUSCO (MRN [id number]) as of 06/15/2015 12:30  Ref. Range 06/15/2015 20:15 06/19/2015 00:15 06/25/2015 04:25 06/25/2015 07:12 06/17/2015 11:41  Glucose-Capillary Latest Ref Range: 65-99 mg/dL 183 (H) 200 (H) 249 (H) 199 (H) 220 (H)   Inpatient Diabetes Program Recommendations:    Consider restarting Levemir 10 units daily.    Thanks, Adah Perl, RN, BC-ADM Inpatient Diabetes Coordinator Pager [phone] (8a-5p)

## 2015-06-16 NOTE — Progress Notes (Signed)
Patient remains intubated and sedated  on vent, appears comfortable overnight. Patient has had intermittant tachycardia up to 160 overnight nonsustained. Further Vital signs stable, UOP WNL of 34m/hr.  Per Dr. JOrson Slicknote patient for potential  Trach and pleurex catheter placement today. See CHL for further assessment. Will continue to assess for changes/need.

## 2015-06-16 NOTE — Progress Notes (Signed)
[facility] Progress Note Patient Name: Emily Pratt DOB: [birth year] MRN: [id number]   Date of Service  06/04/2015  HPI/Events of Note  Pt with sinus tachy vs SVT very soon after abruptly stopping versed and fentanyl gtt's  eICU Interventions  Restart the fentanyl at the prior 150, hold the versed .  chack ECG when she slows some to assess to insure this is sinus     Intervention Category Intermediate Interventions: Other:;Arrhythmia - evaluation and management  Shareen Capwell S. 06/09/2015, 4:56 PM

## 2015-06-16 NOTE — Progress Notes (Signed)
MEDICATION RELATED CONSULT NOTE - Follow Up  Pharmacy Consult for Constipation Prevention   Allergies  Allergen Reactions  . Atorvastatin Other (See Comments)    Does not remember why she had Intolerance to Lipitor.  . Rosuvastatin Anxiety    Chest tigtness and feeling as if she had heartburn.    Patient Measurements: Height: '5\' 7"'$  (170.2 cm) Weight: 265 lb 3.4 oz (120.3 kg) IBW/kg (Calculated) : 61.6   Vital Signs: Temp: 99.2 F (37.3 C) (11/21 0800) Temp Source: Oral (11/21 0800) BP: 117/47 mmHg (11/21 0800) Pulse Rate: 101 (11/21 0800) Intake/Output from previous day: 11/20 0701 - 11/21 0700 In: 2006.6 [I.V.:686.6; NG/GT:1220; IV Piggyback:100] Out: 1875 [Urine:1875] Intake/Output from this shift: Total I/O In: 78.5 [I.V.:78.5] Out: 175 [Urine:175]  Labs:  Recent Labs  06/13/15 1412 06/14/15 0557 06/15/15 0520 06/24/2015 0857  WBC 11.7*  --  9.5 9.7  HGB 8.1*  --  7.6* 7.4*  HCT 24.7*  --  23.5* 22.7*  PLT 108*  --  114* 123*  APTT 33  --   --   --   CREATININE 0.69 0.75 0.73 0.73  MG 1.4* 1.6* 1.7 1.5*  PHOS 2.8  --   --  2.6   Estimated Creatinine Clearance: 91.7 mL/min (by C-G formula based on Cr of 0.73).   Microbiology: Recent Results (from the past 720 hour(s))  MRSA PCR Screening     Status: None   Collection Time: 04/28/2015 12:28 AM  Result Value Ref Range Status   MRSA by PCR NEGATIVE NEGATIVE Final    Comment:        The GeneXpert MRSA Assay (FDA approved for NASAL specimens only), is one component of a comprehensive MRSA colonization surveillance program. It is not intended to diagnose MRSA infection nor to guide or monitor treatment for MRSA infections.   Culture, blood (routine x 2)     Status: None   Collection Time: 04/29/2015  1:13 AM  Result Value Ref Range Status   Specimen Description BLOOD RIGHT HAND  Final   Special Requests BOTTLES DRAWN AEROBIC AND ANAEROBIC 3CC  Final   Culture NO GROWTH 5 DAYS  Final   Report Status  05/24/2015 FINAL  Final  Culture, blood (routine x 2)     Status: None   Collection Time: 05/22/2015  1:13 AM  Result Value Ref Range Status   Specimen Description BLOOD RIGHT ASSIST CONTROL  Final   Special Requests BOTTLES DRAWN AEROBIC AND ANAEROBIC 4CC  Final   Culture NO GROWTH 5 DAYS  Final   Report Status 05/24/2015 FINAL  Final  Culture, expectorated sputum-assessment     Status: None   Collection Time: 05/20/15  2:00 PM  Result Value Ref Range Status   Specimen Description EXPECTORATED SPUTUM  Final   Special Requests NONE  Final   Sputum evaluation THIS SPECIMEN IS ACCEPTABLE FOR SPUTUM CULTURE  Final   Report Status 05/20/2015 FINAL  Final  Culture, respiratory (NON-Expectorated)     Status: None   Collection Time: 05/20/15  2:00 PM  Result Value Ref Range Status   Specimen Description EXPECTORATED SPUTUM  Final   Special Requests NONE Reflexed from [id number]  Final   Gram Stain   Final    FEW WBC SEEN FEW GRAM POSITIVE RODS FAIR SPECIMEN - 70-80% WBCS    Culture APPEARS TO BE NORMAL FLORA  Final   Report Status 05/22/2015 FINAL  Final  Culture, bal-quantitative     Status: None   Collection  Time: 05/20/15  5:05 PM  Result Value Ref Range Status   Specimen Description BRONCHIAL ALVEOLAR LAVAGE  Final   Special Requests Normal  Final   Gram Stain   Final    MODERATE WBC SEEN RARE GRAM POSITIVE RODS RARE GRAM POSITIVE COCCI IN CHAINS GOOD SPECIMEN - 80-90% WBCS    Culture APPEARS TO BE NORMAL FLORA  Final   Report Status 05/22/2015 FINAL  Final  Fungus Culture with Smear     Status: None   Collection Time: 05/20/15  5:05 PM  Result Value Ref Range Status   Specimen Description SPUTUM  Final   Special Requests Normal  Final   Culture CANDIDA DUBLINIENSIS  Final   Report Status 06/10/2015 FINAL  Final  C difficile quick scan w PCR reflex     Status: None   Collection Time: 05/29/15  4:50 PM  Result Value Ref Range Status   C Diff antigen NEGATIVE NEGATIVE Final   C  Diff toxin NEGATIVE NEGATIVE Final   C Diff interpretation Negative for C. difficile  Final  Urine culture     Status: None   Collection Time: 05/29/15  6:19 PM  Result Value Ref Range Status   Specimen Description URINE, RANDOM  Final   Special Requests NONE  Final   Culture >=100,000 COLONIES/mL CANDIDA ALBICANS  Final   Report Status 06/04/2015 FINAL  Final  Culture, blood (routine x 2)     Status: None (Preliminary result)   Collection Time: 06/12/15 11:54 AM  Result Value Ref Range Status   Specimen Description BLOOD LEFT ASSIST CONTROL  Final   Special Requests BOTTLES DRAWN AEROBIC AND ANAEROBIC  3CC  Final   Culture NO GROWTH 3 DAYS  Final   Report Status PENDING  Incomplete  Culture, blood (routine x 2)     Status: None (Preliminary result)   Collection Time: 06/12/15 11:55 AM  Result Value Ref Range Status   Specimen Description BLOOD LEFT FA  Final   Special Requests   Final    BOTTLES DRAWN AEROBIC AND ANAEROBIC 2CC ANAERO 3CC AERO   Culture NO GROWTH 3 DAYS  Final   Report Status PENDING  Incomplete  Urine culture     Status: None   Collection Time: 06/12/15 11:55 AM  Result Value Ref Range Status   Specimen Description URINE, CATHETERIZED  Final   Special Requests Immunocompromised  Final   Culture 10,000 COLONIES/mL CANDIDA ALBICANS  Final   Report Status 06/15/2015 FINAL  Final  Culture, expectorated sputum-assessment     Status: None   Collection Time: 06/12/15  4:44 PM  Result Value Ref Range Status   Specimen Description TRACHEAL ASPIRATE  Final   Special Requests Immunocompromised  Final   Sputum evaluation THIS SPECIMEN IS ACCEPTABLE FOR SPUTUM CULTURE  Final   Report Status 06/12/2015 FINAL  Final  Culture, respiratory (NON-Expectorated)     Status: None   Collection Time: 06/12/15  4:44 PM  Result Value Ref Range Status   Specimen Description TRACHEAL ASPIRATE  Final   Special Requests Immunocompromised Reflexed from [id number]  Final   Gram Stain   Final     GOOD SPECIMEN - 80-90% WBCS MODERATE WBC SEEN MODERATE GRAM VARIABLE ROD FEW GRAM POSITIVE COCCI IN PAIRS MANY GRAM POSITIVE RODS    Culture Consistent with normal respiratory flora.  Final   Report Status 06/15/2015 FINAL  Final  Culture, expectorated sputum-assessment     Status: None   Collection Time: 06/14/15  5:19 PM  Result Value Ref Range Status   Specimen Description SPUTUM  Final   Special Requests NONE  Final   Sputum evaluation THIS SPECIMEN IS ACCEPTABLE FOR SPUTUM CULTURE  Final   Report Status 06/14/2015 FINAL  Final  Culture, respiratory (NON-Expectorated)     Status: None (Preliminary result)   Collection Time: 06/14/15  5:19 PM  Result Value Ref Range Status   Specimen Description SPUTUM  Final   Special Requests NONE Reflexed from [id number]  Final   Gram Stain   Final    FAIR SPECIMEN - 70-80% WBCS MODERATE WBC SEEN MANY GRAM POSITIVE RODS FEW GRAM POSITIVE COCCI IN PAIRS RARE GRAM NEGATIVE RODS    Culture TOO YOUNG TO READ  Final   Report Status PENDING  Incomplete    Medical History: Past Medical History  Diagnosis Date  . Anxiety   . Arthritis   . COPD (chronic obstructive pulmonary disease) ([place])   . CHF (congestive heart failure) ([place])   . Hyperlipidemia   . Hypertension   . Diabetes mellitus without complication ([place])   . Chronic kidney disease   . Neuromuscular disorder ([place])     Medications:  Scheduled:  . antiseptic oral rinse  7 mL Mouth Rinse 10 times per day  . budesonide (PULMICORT) nebulizer solution  0.5 mg Nebulization BID  . chlorhexidine gluconate  15 mL Mouth Rinse BID  . famotidine (PEPCID) IV  20 mg Intravenous Q12H  . feeding supplement (PRO-STAT SUGAR FREE 64)  30 mL Per Tube Daily  . feeding supplement (VITAL HIGH PROTEIN)  1,000 mL Per Tube Q24H  . free water  200 mL Per Tube BID  . insulin aspart  2-6 Units Subcutaneous 6 times per day  . ipratropium-albuterol  3 mL Nebulization Q6H  . magnesium sulfate 1 - 4 g  bolus IVPB  2 g Intravenous Once  . senna-docusate  1 tablet Oral BID   Infusions:  . fentaNYL infusion INTRAVENOUS 100 mcg/hr (06/06/2015 0830)  . midazolam (VERSED) infusion 2 mg/hr (06/09/2015 0830)    Assessment: Pharmacy consulted to assist in constipation prevention in this 69 y/o F with multiple medical problems who is currently intubated and sedated.  Patient stated on senna-docusate BID  On 11/16.  Plan:  Last BM 11/18 smear. Will consider increasing senna-docusate back to 2 tabs po bid if no BM by 11/22.    Ulice Dash, PharmD Clinical Pharmacist 06/10/2015 10:52 AM

## 2015-06-16 NOTE — Progress Notes (Signed)
1625 fentanyl and versed gtt dc'd per order by dr simonds 1610 pt hr suddenly  increased to 173 elink notified, per elink dr bryum, give 100 mcg fentanyl push, restart fentanyl gtt at 128mg/hr, continue to hold versed, ekg will be ordered, instructions are to complete ekg when hr down to 130 or below Push given, gtt restarted When scanning fentanyl gtt, recvd error message "product barcode not recognized", called pharmacy s/w jason, was instructed to do a manual override due to the scanner not recognizing the barcode, this is a problem they are working  On correcting.   Pt hr now 150, will continue to monitor

## 2015-06-16 NOTE — Op Note (Signed)
06/04/2015 11:11 AM  Fuller Plan [id number]  Pre-Op Dx: Vent dependent trach secondary to lung cancer, prolonged intubation  Post-Op Dx:  Same  Proc:  Tracheostomy  Surg:  Anson Peddie H  Anes:  GOT  EBL:  Minimal  Comp:  None  Findings:  Very fat neck and deep trachea. Had to place an XLT proximal length tube in to fit the trachea.  Procedure:  The patient was brought from the intensive care unit to the operating room and transferred to an operating table.  Anesthesia was administered per indwelling orotracheal tube.   Neck extension was achieved as possible anda shoulder rolke was placed.  The lower neck was palpated with the findings as described above.  1% Xylocaine with 1:100,000 epinephrine, 4 cc's, was infiltrated into the surgical field for intraoperative hemostasis.  Several minutes were allowed for this to take effect. The patient was prepped in a sterile fashion with a surgical prep from the chin down to the upper chest.  Sterile draping was accomplished in the standard fashion.  A  2-1/2 cm horizontal incision was made sharply a finger's breadth above the sternal notch, and extended through skin and subcutaneous fat.  Using cautery, the superficial layer of the deep cervical fascia was lysed.  Additional dissection revealed the strap muscles.  The midline raphe was divided in two layers and the muscles retracted laterally.  The pretracheal plane was visualized.  This was entered bluntly.  The thyroid isthmus was isolated and divided with the Harmonic scalpel.  The thyroid gland was retracted to either side.  The anterior face of the trachea was cleared.  In the  second interspace, a transverse incision was made between cartilage rings into the tracheal lumen.  A 6 mm wide inferiorly based flap was generated and secured to the lower wound with a 4-0 chromic suture.   A previously tested  # 7 Shiley cuffed XLTP tracheostomy tube was brought into the field.  With the endotracheal  tube under direct visualization through the tracheostomy, it was gently backed up.  The tracheostomy tube was inserted into the tracheal lumen.  Hemostasis was observed. The cuff was inflated and observed to be intact and containing pressure. The inner cannula was placed and ventilation assumed per tracheostomy tube.  Good chest wall motion was observed, and CO2 was documented per anesthesia.  The trach tube was secured in the standard fashion with trach ties. A 2-0 Nylon suture was used to secure the trach tube to the skin on both sides.  Hemostasis was observed again.  When satisfactory ventilation was assured, the orotracheal tube was removed.  At this point the procedure was completed.  The patient was returned to anesthesia, awakened as possible, and transferred back to the intensive care unit in stable condition.  Comment: 69 y.o. white female with prolonged ventilation was the indication for today's procedure.  Anticipate a routine postoperative recovery including standard tracheal hygiene.  The sutures should be removed in 5 days.  When the patient no longer requires ventilator or pressure support, the cuff should be deflated.  Changing to an uncuffed tube and downsizing will be according to the clinical condition of the patient.   Joshuwa Vecchio H  11:11 AM 05/30/2015

## 2015-06-16 NOTE — Anesthesia Postprocedure Evaluation (Signed)
Anesthesia Post Note  Patient: Emily Pratt  Procedure(s) Performed: Procedure(s) (LRB): TRACHEOSTOMY (N/A)  Patient location during evaluation: ICU Anesthesia Type: General Level of consciousness: sedated Pain management: pain level controlled Vital Signs Assessment: post-procedure vital signs reviewed and stable Respiratory status: patient on ventilator - see flowsheet for VS Cardiovascular status: blood pressure returned to baseline and stable Postop Assessment: No signs of nausea or vomiting Anesthetic complications: no    Last Vitals:  Filed Vitals:   06/04/2015 0700 06/06/2015 0800  BP: 118/48 117/47  Pulse: 105 101  Temp:  37.3 C  Resp: 18 18    Last Pain:  Filed Vitals:   06/11/2015 0828  PainSc: 0-No pain    LLE Motor Response: Purposeful movement   RLE Motor Response: Purposeful movement        Margie Brink M

## 2015-06-16 NOTE — H&P (Signed)
  Pt is stable and unchanged from yesterday.  Discussed with husband who understands the significant risks to anesthesia and surgery, and he wishes to proceed.

## 2015-06-16 NOTE — Progress Notes (Signed)
   06/19/2015 1200  Clinical Encounter Type  Visited With Patient;Family  Visit Type Follow-up  Consult/Referral To Chaplain  Spiritual Encounters  Spiritual Needs Emotional;Other (Comment)  Stress Factors  Patient Stress Factors Not reviewed  Family Stress Factors Other (Comment)  Chaplain rounded in the unit and offered assistance to the family member and support as needed. Chaplain Iniya Matzek A. Dionis Autry Ext. [phone]

## 2015-06-16 NOTE — Progress Notes (Signed)
Case d/w Dr. Shawna Orleans he will take patient on his service and call hospitalist when treafered back.

## 2015-06-16 NOTE — Progress Notes (Signed)
Palliative Care upate- Patient referred to Palliative Care for goals of care and code status discussion. At this time, patient remains a full code and according to recent notes, still desires to continue treatment for lung cancer once she becomes stable. Patient has had trach placement today and resting in room. Did not respond to verbal or tactile stimuli. No family present. Telephone call made to patient's husband Emily Pratt in attempts to schedule palliative care visit when he is present at the hospital. Received no answer, left voicemail with writer's contact information requesting return phone call. Updated care team including Dr. Megan Salon. Will continue to follow-up.  Atha Starks, MSW, LCSW Palliative Care social worker [phone] (c)

## 2015-06-16 NOTE — Anesthesia Preprocedure Evaluation (Addendum)
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 PO2 74, PCO2 71, Hb 7.4, and neutropenic.   breath sounds clear to auscultation       Cardiovascular Exercise Tolerance: Poor hypertension, Pt. on medications +CHF   Rhythm:Regular Rate:Tachycardia     Neuro/Psych    GI/Hepatic   Endo/Other  diabetes, Type 2BG 199.  Renal/GU      Musculoskeletal   Abdominal (+) + obese,  Abdomen: soft.    Peds  Hematology  (+) anemia ,   Anesthesia Other Findings   Reproductive/Obstetrics                           Anesthesia Physical Anesthesia Plan  ASA: V  Anesthesia Plan: General   Post-op Pain Management:    Induction: Inhalational  Airway Management Planned: Oral ETT  Additional Equipment:   Intra-op Plan:   Post-operative Plan: Post-operative intubation/ventilation  Informed Consent:   History available from chart only  Plan Discussed with: CRNA  Anesthesia Plan Comments: (Respiratory failure, CHF, small cell lung cancer, prolonged intubation and hypoxia, hypercarbia. She will have a tracheotomy performed in the OR today.)        Anesthesia Quick Evaluation

## 2015-06-16 NOTE — Transfer of Care (Signed)
Immediate Anesthesia Transfer of Care Note  Patient: Emily Pratt  Procedure(s) Performed: Procedure(s): TRACHEOSTOMY (N/A)  Patient Location: ICU  Anesthesia Type:General  Level of Consciousness: sedated and Patient remains intubated per anesthesia plan  Airway & Oxygen Therapy: Patient placed on Ventilator (see vital sign flow sheet for setting)  Post-op Assessment: Report given to RN and Post -op Vital signs reviewed and stable  Post vital signs: stable  Last Vitals:  Filed Vitals:   06/21/2015 0800 06/08/2015 1141  BP: 117/47 159/73  Pulse: 101 155  Temp: 37.3 C 36.8 C  Resp: 18     Complications: No apparent anesthesia complications

## 2015-06-16 NOTE — Progress Notes (Signed)
05/29/2015 6:20pm  Postop check  Trach site with minimal ooze, fairly dry. No swelling.  Vent settings stable.  Has developed some SVT.  Will get portable CXR to make sure no change in lung status post trach.

## 2015-06-16 NOTE — Progress Notes (Signed)
ELECTROLYTE CONSULT NOTE - INITIAL  Pharmacy Consult for electrolyte monitoring  Allergies  Allergen Reactions  . Atorvastatin Other (See Comments)    Does not remember why she had Intolerance to Lipitor.  . Rosuvastatin Anxiety    Chest tigtness and feeling as if she had heartburn.    Patient Measurements: Height: '5\' 7"'$  (170.2 cm) Weight: 265 lb 3.4 oz (120.3 kg) IBW/kg (Calculated) : 61.6   Vital Signs: Temp: 99.2 F (37.3 C) (11/21 0800) Temp Source: Oral (11/21 0800) BP: 117/47 mmHg (11/21 0800) Pulse Rate: 101 (11/21 0800) Intake/Output from previous day: 11/20 0701 - 11/21 0700 In: 2006.6 [I.V.:686.6; NG/GT:1220; IV Piggyback:100] Out: 1875 [Urine:1875] Intake/Output from this shift: Total I/O In: 78.5 [I.V.:78.5] Out: 175 [Urine:175]  Labs:  Recent Labs  06/13/15 1412 06/15/15 0520 06/20/2015 0857  WBC 11.7* 9.5 9.7  HGB 8.1* 7.6* 7.4*  HCT 24.7* 23.5* 22.7*  PLT 108* 114* 123*  APTT 33  --   --   INR 1.15  --   --      Recent Labs  06/13/15 1412 06/14/15 0557 06/15/15 0520 06/15/2015 0857  NA 141 140 140 142  K 4.4 4.3 4.6 4.5  CL 101 100* 100* 101  CO2 35* 37* 37* 37*  GLUCOSE 197* 209* 218* 201*  BUN 21* 20 21* 25*  CREATININE 0.69 0.75 0.73 0.73  CALCIUM 8.4* 8.4* 8.2* 8.1*  MG 1.4* 1.6* 1.7 1.5*  PHOS 2.8  --   --  2.6   Estimated Creatinine Clearance: 91.7 mL/min (by C-G formula based on Cr of 0.73).    Recent Labs  06/23/2015 0015 06/05/2015 0425 06/15/2015 0712  GLUCAP 200* 249* 199*    Medical History: Past Medical History  Diagnosis Date  . Anxiety   . Arthritis   . COPD (chronic obstructive pulmonary disease) ([place])   . CHF (congestive heart failure) ([place])   . Hyperlipidemia   . Hypertension   . Diabetes mellitus without complication ([place])   . Chronic kidney disease   . Neuromuscular disorder [facility])     Assessment: 69 year old female with SCLC and currently intubated. Pharmacy consulted for monitoring and managing  electrolytes.  Plan: Electrolytes are wnl except for magnesium which is low at 1.5 so will replace with magnesium sulfate 2 grams iv once then f/u AM labs.   05/31/2015

## 2015-06-17 ENCOUNTER — Encounter: Payer: Self-pay | Admitting: Certified Registered"

## 2015-06-17 ENCOUNTER — Inpatient Hospital Stay: Payer: Medicare Other

## 2015-06-17 ENCOUNTER — Encounter: Admission: EM | Disposition: E | Payer: Self-pay | Source: Home / Self Care | Attending: Pulmonary Disease

## 2015-06-17 DIAGNOSIS — C3411 Malignant neoplasm of upper lobe, right bronchus or lung: Principal | ICD-10-CM

## 2015-06-17 DIAGNOSIS — C3490 Malignant neoplasm of unspecified part of unspecified bronchus or lung: Secondary | ICD-10-CM | POA: Insufficient documentation

## 2015-06-17 DIAGNOSIS — E8770 Fluid overload, unspecified: Secondary | ICD-10-CM

## 2015-06-17 LAB — MAGNESIUM: Magnesium: 1.7 mg/dL (ref 1.7–2.4)

## 2015-06-17 LAB — CBC
HCT: 24.6 % — ABNORMAL LOW (ref 35.0–47.0)
Hemoglobin: 7.8 g/dL — ABNORMAL LOW (ref 12.0–16.0)
MCH: 29.2 pg (ref 26.0–34.0)
MCHC: 31.7 g/dL — ABNORMAL LOW (ref 32.0–36.0)
MCV: 91.9 fL (ref 80.0–100.0)
PLATELETS: 158 10*3/uL (ref 150–440)
RBC: 2.68 MIL/uL — ABNORMAL LOW (ref 3.80–5.20)
RDW: 18.7 % — AB (ref 11.5–14.5)
WBC: 11.9 10*3/uL — ABNORMAL HIGH (ref 3.6–11.0)

## 2015-06-17 LAB — BASIC METABOLIC PANEL
Anion gap: 7 (ref 5–15)
BUN: 26 mg/dL — ABNORMAL HIGH (ref 6–20)
CALCIUM: 8.5 mg/dL — AB (ref 8.9–10.3)
CO2: 37 mmol/L — AB (ref 22–32)
CREATININE: 0.78 mg/dL (ref 0.44–1.00)
Chloride: 99 mmol/L — ABNORMAL LOW (ref 101–111)
GFR calc Af Amer: >60 mL/min (ref >60)
GFR calc non Af Amer: >60 mL/min (ref >60)
GLUCOSE: 211 mg/dL — AB (ref 65–99)
Potassium: 4.9 mmol/L (ref 3.5–5.1)
Sodium: 143 mmol/L (ref 135–145)

## 2015-06-17 LAB — GLUCOSE, CAPILLARY
Glucose-Capillary: 180 mg/dL — ABNORMAL HIGH (ref 65–99)
Glucose-Capillary: 201 mg/dL — ABNORMAL HIGH (ref 65–99)
Glucose-Capillary: 171 mg/dL — ABNORMAL HIGH (ref 65–99)
Glucose-Capillary: 173 mg/dL — ABNORMAL HIGH (ref 65–99)
Glucose-Capillary: 153 mg/dL — ABNORMAL HIGH (ref 65–99)
Glucose-Capillary: 194 mg/dL — ABNORMAL HIGH (ref 65–99)

## 2015-06-17 SURGERY — CHEST TUBE INSERTION
Anesthesia: Choice | Laterality: Right

## 2015-06-17 MED ORDER — FUROSEMIDE 10 MG/ML IJ SOLN
40.0000 mg | Freq: Two times a day (BID) | INTRAMUSCULAR | Status: AC
  Administered 2015-06-17 – 2015-06-18 (×2): 40 mg via INTRAVENOUS
  Filled 2015-06-17 (×2): qty 4

## 2015-06-17 MED ORDER — MIDAZOLAM HCL 2 MG/2ML IJ SOLN
2.0000 mg | INTRAMUSCULAR | Status: DC | PRN
  Administered 2015-06-17: 2 mg via INTRAVENOUS
  Filled 2015-06-17: qty 2

## 2015-06-17 MED ORDER — FENTANYL 2500MCG IN NS 250ML (10MCG/ML) PREMIX INFUSION
25.0000 ug/h | INTRAVENOUS | Status: DC
  Administered 2015-06-17: 75 ug/h via INTRAVENOUS
  Filled 2015-06-17: qty 250

## 2015-06-17 MED ORDER — METOPROLOL TARTRATE 25 MG/10 ML ORAL SUSPENSION
100.0000 mg | Freq: Two times a day (BID) | ORAL | Status: DC
Start: 2015-06-17 — End: 2015-06-17

## 2015-06-17 MED ORDER — METOPROLOL TARTRATE 1 MG/ML IV SOLN
2.5000 mg | INTRAVENOUS | Status: DC | PRN
  Administered 2015-06-17 – 2015-06-18 (×3): 5 mg via INTRAVENOUS
  Administered 2015-06-23: 2.5 mg via INTRAVENOUS
  Administered 2015-06-25 – 2015-06-27 (×3): 5 mg via INTRAVENOUS
  Filled 2015-06-17 (×6): qty 5

## 2015-06-17 MED ORDER — METOPROLOL TARTRATE 1 MG/ML IV SOLN
INTRAVENOUS | Status: AC
  Filled 2015-06-17: qty 5

## 2015-06-17 MED ORDER — INSULIN GLARGINE 100 UNIT/ML ~~LOC~~ SOLN
10.0000 [IU] | Freq: Every day | SUBCUTANEOUS | Status: DC
  Administered 2015-06-17: 10 [IU] via SUBCUTANEOUS
  Filled 2015-06-17 (×2): qty 0.1

## 2015-06-17 MED ORDER — FREE WATER
200.0000 mL | Freq: Three times a day (TID) | Status: DC
  Administered 2015-06-17 – 2015-06-19 (×6): 200 mL
  Administered 2015-06-19: 100 mL
  Administered 2015-06-20 – 2015-06-24 (×13): 200 mL

## 2015-06-17 MED ORDER — FENTANYL 75 MCG/HR TD PT72
75.0000 ug | MEDICATED_PATCH | TRANSDERMAL | Status: DC
  Administered 2015-06-17 – 2015-06-20 (×2): 75 ug via TRANSDERMAL
  Filled 2015-06-17 (×3): qty 1

## 2015-06-17 MED ORDER — METOPROLOL TARTRATE 100 MG PO TABS
100.0000 mg | ORAL_TABLET | Freq: Two times a day (BID) | ORAL | Status: DC
  Administered 2015-06-17 – 2015-07-15 (×54): 100 mg
  Filled 2015-06-17 (×55): qty 1

## 2015-06-17 NOTE — Progress Notes (Signed)
Nutrition Follow-up   INTERVENTION:  EN: Discussed in ICU rounds and planning to resume tube feeding of vital high protein at 42m/hr with prostat daily this am per Dr. SAlva Garnet    NUTRITION DIAGNOSIS:   Inadequate oral intake related to acute illness as evidenced by NPO status.    GOAL:   Patient will meet greater than or equal to 90% of their needs    MONITOR:    (Energy Intake, Anthropometrics, Electrolyte/Renal Profile, Digestive System, Electrolyte/Renal Profile)  REASON FOR ASSESSMENT:   Consult Enteral/tube feeding initiation and management  ASSESSMENT:      Pt s/p trach yesterday was planning pleurex cath placement this am and not going to be done per Dr. SAlva Garnet  MD gave order to nursing to resume tube feeding   Current Nutrition: previously tolerating tube feeding at goal rate   Gastrointestinal Profile: Last BM: 11/18   Scheduled Medications:  . antiseptic oral rinse  7 mL Mouth Rinse 10 times per day  . budesonide (PULMICORT) nebulizer solution  0.5 mg Nebulization BID  . chlorhexidine gluconate  15 mL Mouth Rinse BID  . famotidine  20 mg Per Tube BID  . feeding supplement (PRO-STAT SUGAR FREE 64)  30 mL Per Tube Daily  . feeding supplement (VITAL HIGH PROTEIN)  1,000 mL Per Tube Q24H  . fentaNYL  75 mcg Transdermal Q72H  . free water  200 mL Per Tube 3 times per day  . insulin aspart  0-20 Units Subcutaneous 6 times per day  . insulin glargine  10 Units Subcutaneous Daily  . ipratropium-albuterol  3 mL Nebulization Q6H  . metoprolol      . metoprolol tartrate  100 mg Per Tube BID  . senna-docusate  1 tablet Per Tube BID    Continuous Medications:  . fentaNYL infusion INTRAVENOUS 300 mcg/hr (05/27/2015 0637)     Electrolyte/Renal Profile and Glucose Profile:   Recent Labs Lab 06/12/15 1155  06/13/15 1412  06/15/15 0520 06/23/2015 0857 06/13/2015 0444  NA  --   < > 141  < > 140 142 143  K  --   < > 4.4  < > 4.6 4.5 4.9  CL  --   < > 101   < > 100* 101 99*  CO2  --   < > 35*  < > 37* 37* 37*  BUN  --   < > 21*  < > 21* 25* 26*  CREATININE  --   < > 0.69  < > 0.73 0.73 0.78  CALCIUM  --   < > 8.4*  < > 8.2* 8.1* 8.5*  MG 1.6*  --  1.4*  < > 1.7 1.5* 1.7  PHOS 3.2  --  2.8  --   --  2.6  --   GLUCOSE  --   < > 197*  < > 218* 201* 211*  < > = values in this interval not displayed.    Weight Trend since Admission: Filed Weights   06/14/15 0400 06/15/15 0414 06/24/2015 0500  Weight: 253 lb 8.5 oz (115 kg) 265 lb 3.4 oz (120.3 kg) 256 lb 6.3 oz (116.3 kg)      Diet Order:   NPO  Skin:   (Stage II pressure ulcer on sacrum)   Height:   Ht Readings from Last 1 Encounters:  05/18/2015 '5\' 7"'$  (1.702 m)    Weight:   Wt Readings from Last 1 Encounters:  06/09/2015 256 lb 6.3 oz (116.3 kg)  Ideal Body Weight:     BMI:  Body mass index is 40.15 kg/(m^2).  Estimated Nutritional Needs:   Kcal:  1254-1596kcals, (11-14kcals/kg) using current weight of 114kg)  Protein:  122-153 g (2.0-2.5 g/kg IBW) or 140-176 g (1.2-1.5 g/kg current wt)  Fluid:  1535-1875m of fluid (25-329mkg)  EDUCATION NEEDS:   No education needs identified at this time  HIGH Care Level  Nhat Hearne B. AlZenia ResidesRDDonaldLDThree Riverspager)

## 2015-06-17 NOTE — Progress Notes (Addendum)
RASS -4, not F/C. On vent  Filed Vitals:   06/07/2015 1300 05/28/2015 1400 06/24/2015 1500 05/31/2015 1600  BP: 122/61 113/59 105/50 135/62  Pulse: 105 100 99 105  Temp: 99 F (37.2 C)   99.2 F (37.3 C)  TempSrc: Oral   Oral  Resp: '17 18 20 23  '$ Height:      Weight:      SpO2: 83% 94% 93% 90%   HEENT WNL Trach site clean Scattered rhonchi, prolonged exp phase Reg, no M Obese, soft, diminished BS Ext warm, symmetric edema   CMP Latest Ref Rng 05/31/2015 06/12/2015 06/15/2015  Glucose 65 - 99 mg/dL 211(H) 201(H) 218(H)  BUN 6 - 20 mg/dL 26(H) 25(H) 21(H)  Creatinine 0.44 - 1.00 mg/dL 0.78 0.73 0.73  Sodium 135 - 145 mmol/L 143 142 140  Potassium 3.5 - 5.1 mmol/L 4.9 4.5 4.6  Chloride 101 - 111 mmol/L 99(L) 101 100(L)  CO2 22 - 32 mmol/L 37(H) 37(H) 37(H)  Calcium 8.9 - 10.3 mg/dL 8.5(L) 8.1(L) 8.2(L)  Total Protein 6.5 - 8.1 g/dL - - -  Total Bilirubin 0.3 - 1.2 mg/dL - - -  Alkaline Phos 38 - 126 U/L - - -  AST 15 - 41 U/L - - -  ALT 14 - 54 U/L - - -    CBC    Component Value Date/Time   WBC 11.9* 05/30/2015 0444   RBC 2.68* 05/31/2015 0444   HGB 7.8* 06/23/2015 0444   HCT 24.6* 06/13/2015 0444   PLT 158 05/31/2015 0444   MCV 91.9 06/15/2015 0444   MCH 29.2 06/08/2015 0444   MCHC 31.7* 06/11/2015 0444   RDW 18.7* 06/13/2015 0444   LYMPHSABS 0.5* 06/03/2015 0539   MONOABS 0.0* 06/03/2015 0539   EOSABS 0.0 06/03/2015 0539   BASOSABS 0.0 06/03/2015 0539    CXR: [place]  IMPRESSION: Prolonged VDRF 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, chemotherapy  Significant improvement in airway patency by bronchoscopy 11/22 COPD - evidence of airflow obstruction on flow curves PSVT  Anemia - improving Thrombocytopenia - improving Agitated delirium Volume overload  PLAN/REC: Cont vent support - settings reviewed and/or adjusted Wean in PSV as tolerated Cont vent bundle Nebulized steroids Nebulized bronchodilators Scheduled enteral metoprolol PRN IV metoprolol to  maintain HR < 115/min Monitor BMET intermittently Monitor I/Os Correct electrolytes as indicated Lasix X 2 doses 11/22 DVT px: SCDs Monitor CBC intermittently Transfuse per usual guidelines Transition from fentanyl gtt to Duragesic patch Low dose PRN analgesics and anxiolytics Mobilize Needs PT - ordered  Merton Border, MD PCCM service Mobile [phone] Pager [phone]

## 2015-06-17 NOTE — Progress Notes (Signed)
PT Cancellation Note  Patient Details Name: Emily Pratt MRN: [id number] DOB: [birth year]   Cancelled Treatment:    Reason Eval/Treat Not Completed: Medical issues which prohibited therapy (Per chart review and discussion with primary RN, patient with uncontrolled HR, unable to tolerate attempts at PT this date.  will hold and re-attempt next date as medically appropriate.)   Anhar Mcdermott H. Owens Shark, PT, DPT, NCS 05/29/2015, 11:41 AM [phone]

## 2015-06-17 NOTE — Procedures (Signed)
BRONCHOSCOPY  Indication:   eval status os airways  Premedication:  Fentanyl gtt  Procedure: The bronchoscope was introduced via the trach tube and advanced into the tracheobroncial tree. A thorough airway examination was performed. This revealed the following.  Findings:  Moderate amount of very thick mucoid secretions in all bronchopulmonary segments, most severe in the dependent lung zones. After removal of the secretions, the airways were noted to be widely patent with minimal residual tumor in RUL bronchus.   Specimens:   none  Post procedure evaluation:  The patient tolerated the procedure well with no major complications   Merton Border, MD;  PCCM service; Mobile [phone]

## 2015-06-17 NOTE — Progress Notes (Signed)
Pt remains sedated with fenanyl, newly placed tracheostomy yesterday.  Pt withdraws from pain.  With stimulation pt hr increases to 160-170's, with no stimulation pt ST 120's.  Normotensive, VSS.  Feeds on hold as pt to have pleurex placed today by Dr. Genevive Bi.

## 2015-06-17 NOTE — Consult Note (Signed)
WOC wound consult note Reason for Consult:Maceration to sacum/buttocks under the silicone border foam dressing.  Wound type:moisture associated skin damage in conjunction with pressure.  Pressure Ulcer POA: Yes Dressing procedure/placement/frequency:Stop using silicone pink foam.  Begin using barrier cream twice daily and PRN.  No disposables in contact with skin, dermatherapy linen only.  Keep clean and dry.  Will not follow at this time.  Please re-consult if needed.  Domenic Moras RN BSN [place] Pager [phone]

## 2015-06-17 NOTE — Progress Notes (Signed)
[facility] Progress Note Patient Name: Emily Pratt DOB: 1946/06/25 MRN: [id number]   Date of Service  06/23/2015  HPI/Events of Note  Good progress being made weaning fentanyl gtt to off, have gone from 348mg/h to currently 7103m/h. Now with tachycardia, agitation. Suspect that more progress will be made as fentanyl patch begins to take effect. Will reorder gtt with range 25-7575mh with goal to wean to off overnight if possible.   eICU Interventions       Intervention Category Intermediate Interventions: Pain - evaluation and management  BYRUM,ROBERT S. 06/07/2015, 6:18 PM

## 2015-06-17 NOTE — Progress Notes (Signed)
ELECTROLYTE CONSULT NOTE - INITIAL  Pharmacy Consult for electrolyte monitoring  Allergies  Allergen Reactions  . Atorvastatin Other (See Comments)    Does not remember why she had Intolerance to Lipitor.  . Rosuvastatin Anxiety    Chest tigtness and feeling as if she had heartburn.    Patient Measurements: Height: '5\' 7"'$  (170.2 cm) Weight: 265 lb 3.4 oz (120.3 kg) IBW/kg (Calculated) : 61.6   Vital Signs: Temp: 98.5 F (36.9 C) (11/22 0300) Temp Source: Axillary (11/22 0300) BP: 127/63 mmHg (11/22 0400) Pulse Rate: 123 (11/22 0448) Intake/Output from previous day: 11/21 0701 - 11/22 0700 In: 815.7 [I.V.:271.7; NG/GT:494; IV Piggyback:50] Out: 1250 [Urine:675; Emesis/NG output:575] Intake/Output from this shift: Total I/O In: 526 [I.V.:106; NG/GT:420] Out: 350 [Urine:350]  Labs:  Recent Labs  06/15/15 0520 06/03/2015 0857 06/09/2015 0444  WBC 9.5 9.7 11.9*  HGB 7.6* 7.4* 7.8*  HCT 23.5* 22.7* 24.6*  PLT 114* 123* 158     Recent Labs  06/15/15 0520 06/12/2015 0857 06/15/2015 0444  NA 140 142 143  K 4.6 4.5 4.9  CL 100* 101 99*  CO2 37* 37* 37*  GLUCOSE 218* 201* 211*  BUN 21* 25* 26*  CREATININE 0.73 0.73 0.78  CALCIUM 8.2* 8.1* 8.5*  MG 1.7 1.5* 1.7  PHOS  --  2.6  --    Estimated Creatinine Clearance: 91.7 mL/min (by C-G formula based on Cr of 0.78).    Recent Labs  06/11/2015 2329 06/02/2015 0146 05/29/2015 0324  GLUCAP 203* 180* 201*    Medical History: Past Medical History  Diagnosis Date  . Anxiety   . Arthritis   . COPD (chronic obstructive pulmonary disease) ([place])   . CHF (congestive heart failure) ([place])   . Hyperlipidemia   . Hypertension   . Diabetes mellitus without complication ([place])   . Chronic kidney disease   . Neuromuscular disorder [facility])     Assessment: 69 year old female with SCLC and currently intubated. Pharmacy consulted for monitoring and managing electrolytes.  1122 0444 potassium 4.9, magnesium 1.7.    Plan: Electrolytes are wnl. No repletion ordered, will follow up with AM labs.   Delanda Bulluck A. [place], [place].D., BCPS Clinical Pharmacist 06/15/2015

## 2015-06-18 ENCOUNTER — Inpatient Hospital Stay: Payer: Medicare Other

## 2015-06-18 DIAGNOSIS — J189 Pneumonia, unspecified organism: Secondary | ICD-10-CM

## 2015-06-18 DIAGNOSIS — Z930 Tracheostomy status: Secondary | ICD-10-CM

## 2015-06-18 DIAGNOSIS — E46 Unspecified protein-calorie malnutrition: Secondary | ICD-10-CM

## 2015-06-18 DIAGNOSIS — J9600 Acute respiratory failure, unspecified whether with hypoxia or hypercapnia: Secondary | ICD-10-CM

## 2015-06-18 LAB — GLUCOSE, CAPILLARY
Glucose-Capillary: 158 mg/dL — ABNORMAL HIGH (ref 65–99)
Glucose-Capillary: 159 mg/dL — ABNORMAL HIGH (ref 65–99)
Glucose-Capillary: 175 mg/dL — ABNORMAL HIGH (ref 65–99)
Glucose-Capillary: 177 mg/dL — ABNORMAL HIGH (ref 65–99)
Glucose-Capillary: 178 mg/dL — ABNORMAL HIGH (ref 65–99)
Glucose-Capillary: 178 mg/dL — ABNORMAL HIGH (ref 65–99)
Glucose-Capillary: 184 mg/dL — ABNORMAL HIGH (ref 65–99)

## 2015-06-18 LAB — BASIC METABOLIC PANEL
Anion gap: 6 (ref 5–15)
BUN: 29 mg/dL — ABNORMAL HIGH (ref 6–20)
CALCIUM: 8.3 mg/dL — AB (ref 8.9–10.3)
CO2: 37 mmol/L — ABNORMAL HIGH (ref 22–32)
CREATININE: 0.73 mg/dL (ref 0.44–1.00)
Chloride: 100 mmol/L — ABNORMAL LOW (ref 101–111)
Glucose, Bld: 192 mg/dL — ABNORMAL HIGH (ref 65–99)
Potassium: 4.5 mmol/L (ref 3.5–5.1)
SODIUM: 143 mmol/L (ref 135–145)

## 2015-06-18 LAB — CULTURE, BLOOD (ROUTINE X 2)
CULTURE: NO GROWTH
CULTURE: NO GROWTH

## 2015-06-18 LAB — URINALYSIS COMPLETE WITH MICROSCOPIC (ARMC ONLY)
BACTERIA UA: NONE SEEN
Bilirubin Urine: NEGATIVE
Glucose, UA: NEGATIVE mg/dL
KETONES UR: NEGATIVE mg/dL
LEUKOCYTES UA: NEGATIVE
NITRITE: NEGATIVE
PROTEIN: NEGATIVE mg/dL
SPECIFIC GRAVITY, URINE: 1.008 (ref 1.005–1.030)
pH: 8 (ref 5.0–8.0)

## 2015-06-18 LAB — MAGNESIUM: MAGNESIUM: 1.4 mg/dL — AB (ref 1.7–2.4)

## 2015-06-18 LAB — PHOSPHORUS: PHOSPHORUS: 4 mg/dL (ref 2.5–4.6)

## 2015-06-18 MED ORDER — ACETYLCYSTEINE 20 % IN SOLN
3.0000 mL | Freq: Four times a day (QID) | RESPIRATORY_TRACT | Status: DC
  Administered 2015-06-18: 4 mL via RESPIRATORY_TRACT
  Filled 2015-06-18: qty 4

## 2015-06-18 MED ORDER — FUROSEMIDE 10 MG/ML IJ SOLN
40.0000 mg | Freq: Two times a day (BID) | INTRAMUSCULAR | Status: AC
  Administered 2015-06-18 (×2): 40 mg via INTRAVENOUS
  Filled 2015-06-18 (×3): qty 4

## 2015-06-18 MED ORDER — STERILE WATER FOR INJECTION IJ SOLN
INTRAMUSCULAR | Status: AC
  Administered 2015-06-18: 2.2 mL
  Filled 2015-06-18: qty 10

## 2015-06-18 MED ORDER — ALTEPLASE 2 MG IJ SOLR
2.0000 mg | Freq: Once | INTRAMUSCULAR | Status: AC
  Administered 2015-06-18: 2 mg
  Filled 2015-06-18: qty 2

## 2015-06-18 MED ORDER — LEVALBUTEROL HCL 0.63 MG/3ML IN NEBU
0.6300 mg | INHALATION_SOLUTION | Freq: Four times a day (QID) | RESPIRATORY_TRACT | Status: DC
  Administered 2015-06-18: 0.63 mg via RESPIRATORY_TRACT
  Filled 2015-06-18: qty 3

## 2015-06-18 MED ORDER — INSULIN GLARGINE 100 UNIT/ML ~~LOC~~ SOLN
20.0000 [IU] | Freq: Every day | SUBCUTANEOUS | Status: DC
  Administered 2015-06-18 – 2015-07-15 (×26): 20 [IU] via SUBCUTANEOUS
  Filled 2015-06-18 (×33): qty 0.2

## 2015-06-18 MED ORDER — CLONAZEPAM 0.5 MG PO TABS
1.0000 mg | ORAL_TABLET | Freq: Two times a day (BID) | ORAL | Status: DC
  Administered 2015-06-18 (×2): 1 mg
  Administered 2015-06-19 (×2): 0.5 mg
  Administered 2015-06-19 – 2015-06-20 (×2): 1 mg
  Filled 2015-06-18 (×4): qty 2
  Filled 2015-06-18: qty 1

## 2015-06-18 MED ORDER — MAGNESIUM SULFATE 4 GM/100ML IV SOLN
4.0000 g | Freq: Once | INTRAVENOUS | Status: AC
  Administered 2015-06-18: 4 g via INTRAVENOUS
  Filled 2015-06-18: qty 100

## 2015-06-18 MED ORDER — ACETYLCYSTEINE 20 % IN SOLN
3.0000 mL | Freq: Four times a day (QID) | RESPIRATORY_TRACT | Status: DC
  Administered 2015-06-18: 3 mL via RESPIRATORY_TRACT
  Administered 2015-06-19: 4 mL via RESPIRATORY_TRACT
  Administered 2015-06-19 – 2015-06-20 (×6): 3 mL via RESPIRATORY_TRACT
  Administered 2015-06-20: 4 mL via RESPIRATORY_TRACT
  Administered 2015-06-21: 3 mL via RESPIRATORY_TRACT
  Filled 2015-06-18 (×10): qty 4

## 2015-06-18 MED ORDER — VITAL HIGH PROTEIN PO LIQD
1000.0000 mL | ORAL | Status: DC
  Administered 2015-06-19 – 2015-06-20 (×2): 1000 mL

## 2015-06-18 MED ORDER — POLYETHYLENE GLYCOL 3350 17 G PO PACK
17.0000 g | PACK | Freq: Every day | ORAL | Status: DC
  Administered 2015-06-18 – 2015-06-22 (×5): 17 g via ORAL
  Filled 2015-06-18 (×5): qty 1

## 2015-06-18 MED ORDER — BISACODYL 10 MG RE SUPP
10.0000 mg | Freq: Every day | RECTAL | Status: DC | PRN
  Filled 2015-06-18: qty 1

## 2015-06-18 MED ORDER — BISACODYL 10 MG RE SUPP
10.0000 mg | Freq: Once | RECTAL | Status: AC
  Administered 2015-06-18: 10 mg via RECTAL
  Filled 2015-06-18: qty 1

## 2015-06-18 MED ORDER — LEVALBUTEROL HCL 0.63 MG/3ML IN NEBU
0.6300 mg | INHALATION_SOLUTION | Freq: Four times a day (QID) | RESPIRATORY_TRACT | Status: DC
  Administered 2015-06-18 – 2015-07-06 (×72): 0.63 mg via RESPIRATORY_TRACT
  Administered 2015-07-06: 1.26 mg via RESPIRATORY_TRACT
  Administered 2015-07-07 – 2015-07-15 (×33): 0.63 mg via RESPIRATORY_TRACT
  Filled 2015-06-18 (×107): qty 3

## 2015-06-18 MED ORDER — LEVALBUTEROL HCL 0.63 MG/3ML IN NEBU
0.6300 mg | INHALATION_SOLUTION | RESPIRATORY_TRACT | Status: DC | PRN
  Filled 2015-06-18 (×2): qty 3

## 2015-06-18 NOTE — Care Management Note (Signed)
Case Management Note  Patient Details  Name: Emily Pratt MRN: [id number] Date of Birth: 1946-02-14  Subjective/Objective:  Trached and remains on vent.  Case discussed with Dr. Alva Garnet. Tentative plan is chemo next week.                 Action/Plan:   Expected Discharge Date:                  Expected Discharge Plan:     In-House Referral:     Discharge planning Services     Post Acute Care Choice:    Choice offered to:     DME Arranged:    DME Agency:     HH Arranged:    HH Agency:     Status of Service:  In process, will continue to follow  Medicare Important Message Given:    Date Medicare IM Given:    Medicare IM give by:    Date Additional Medicare IM Given:    Additional Medicare Important Message give by:     If discussed at [place] of Stay Meetings, dates discussed:    Additional Comments:  Jolly Mango, RN 06/18/2015, 9:16 AM

## 2015-06-18 NOTE — Therapy (Signed)
Neb tx held due to tachycardia, HR 145-160. Copious amount of thick pale yellow secretions. SBT not done due to high FiO2 requirement, FiO2 .60, tachycardia and tachypnea in PRVC.

## 2015-06-18 NOTE — Progress Notes (Addendum)
Palliative Medicine Inpatient Consult Follow Up Note   Name: Emily Pratt Date: 06/18/2015 MRN: [id number]  DOB: 1945/08/07  Referring Physician: Wilhelmina Mcardle, MD  Palliative Care consult requested for this 69 y.o. female for goals of medical therapy in patient with acute  respiratory failure due to small cell lung cancer.   PLAN: Palliative Care (myself and Hospice staff) have made several attempts to talk to patient and/ or contact family members so that we could arrange an appropriate time to discuss code status/ wishes in person.  The patient was unable to talk about this on three different occasions when I visited her during the early days of her hospitalization. During those days, I had always 'just missed' seeing her husband.   It was decided that we would wait until she got past the time of getting a trache/other surgeries to discuss code status and any other MOST type wishes.  We thought that she would get a trache last week, but this was delayed until this week and so we wanted to make another attempt to have important 'wishes' type conversations with family now that pt has a trache.   Now, it appears that there are some family dynamics about just who is to make decisions for patient when she is unable to make decisions for herself, and this may be why we have had no luck in having any family return calls.    I have updated Dr. Alva Garnet as to why we were following patient at a distance and what we had hoped to accomplish.  I let him know that we plan to sign off at this time.  Though we will be signing off, we leave with recommendations.    It is strongly recommended that patient make a clear WRITTEN determination of who should be her HCPOA (Chaplain can assist in completing a HCPOA form /Living Will simple form).  And, along with having this issue addressed, it is advised that at the right time, a discussion of code status be brought up.  It is more likely that Critical Care/  Attending will be able to bring this up during routine discussions.  We have made a good effort to have important conversations, but have not had success in finding the right time or finding/ reaching the right people to accomplish this.  Please call us again if it appears that we can truly assist with these conversations.        IMPRESSION: Small Cell Lung Cancer ---with acute respiratory failure requiring prolonged ventilation ---now with trache ---much improved aeration as seen on serial CXRs COPD DM2 Thrombocytopenia CKD  Suspected post obstructive Pneumonia Hypernatremia Mild Malnutrition   REVIEW OF SYSTEMS:  Patient is not able to provide ROS due to lethargy/ trache  CODE STATUS: Full code   PAST MEDICAL HISTORY: Past Medical History  Diagnosis Date  . Anxiety   . Arthritis   . COPD (chronic obstructive pulmonary disease) ([place])   . CHF (congestive heart failure) ([place])   . Hyperlipidemia   . Hypertension   . Diabetes mellitus without complication ([place])   . Chronic kidney disease   . Neuromuscular disorder ([place])     PAST SURGICAL HISTORY:  Past Surgical History  Procedure Laterality Date  . Joint replacement Bilateral 2006    both knees  . Ankle surgery    . Tracheostomy tube placement N/A 06/07/2015    Procedure: TRACHEOSTOMY;  Surgeon: Margaretha Sheffield, MD;  Location: [facility] ORS;  Service: ENT;  Laterality: N/A;  Vital Signs: BP 128/56 mmHg  Pulse 116  Temp(Src) 99.3 F (37.4 C) (Oral)  Resp 22  Ht '5\' 7"'$  (1.702 m)  Wt 112 kg (246 lb 14.6 oz)  BMI 38.66 kg/m2  SpO2 96% Filed Weights   06/15/15 0414 06/06/2015 0500 06/18/15 0505  Weight: 120.3 kg (265 lb 3.4 oz) 116.3 kg (256 lb 6.3 oz) 112 kg (246 lb 14.6 oz)    Estimated body mass index is 38.66 kg/(m^2) as calculated from the following:   Height as of this encounter: '5\' 7"'$  (1.702 m).   Weight as of this encounter: 112 kg (246 lb 14.6 oz).  PHYSICAL EXAM: NAD Trached and ventilated Lethargic/  sleeping Hrt rrr no m Lungs decreased BS bases Abd soft nontender Skin warm and dray  LABS: CBC:    Component Value Date/Time   WBC 11.9* 06/21/2015 0444   HGB 7.8* 05/28/2015 0444   HCT 24.6* 06/12/2015 0444   PLT 158 06/19/2015 0444   MCV 91.9 06/10/2015 0444   NEUTROABS 0.2* 06/03/2015 0539   LYMPHSABS 0.5* 06/03/2015 0539   MONOABS 0.0* 06/03/2015 0539   EOSABS 0.0 06/03/2015 0539   BASOSABS 0.0 06/03/2015 0539   Comprehensive Metabolic Panel:    Component Value Date/Time   NA 143 06/18/2015 0459   NA 143 11/14/2014   K 4.5 06/18/2015 0459   CL 100* 06/18/2015 0459   CO2 37* 06/18/2015 0459   BUN 29* 06/18/2015 0459   BUN 21 11/14/2014   CREATININE 0.73 06/18/2015 0459   CREATININE 17.0* 11/14/2014   GLUCOSE 192* 06/18/2015 0459   CALCIUM 8.3* 06/18/2015 0459   AST 22 06/08/2015 0517   ALT 45 06/08/2015 0517   ALKPHOS 113 06/08/2015 0517   BILITOT 1.0 06/08/2015 0517   PROT 4.8* 06/08/2015 0517   ALBUMIN 2.1* 06/08/2015 0517    Time Spent: 55 min

## 2015-06-18 NOTE — Progress Notes (Signed)
Nutrition Follow-up    INTERVENTION:   EN: recommend continuing current TF regimen at this time; agree with bowel regimen as pt without BM for several days   NUTRITION DIAGNOSIS:   Inadequate oral intake related to acute illness as evidenced by NPO status. Being addressed via TF  GOAL:   Patient will meet greater than or equal to 90% of their needs  MONITOR:    (Energy Intake, Anthropometrics, Electrolyte/Renal Profile, Digestive System, Electrolyte/Renal Profile)  REASON FOR ASSESSMENT:   Consult Enteral/tube feeding initiation and management  ASSESSMENT:     Pt remains on vent via trach, SBT as tolerated  EN: tolerating Vital High Protein at rate of 60 ml/hr, Prostat 6 times daily  Skin:   (Stage II pressure ulcer on sacrum)  Digestive System: last BM 11/18, abdomen soft/obese, BS active, no signs of TF intolerance  Electrolyte and Renal Profile:  Recent Labs Lab 06/13/15 1412  06/01/2015 0857 05/30/2015 0444 06/18/15 0459  BUN 21*  < > 25* 26* 29*  CREATININE 0.69  < > 0.73 0.78 0.73  NA 141  < > 142 143 143  K 4.4  < > 4.5 4.9 4.5  MG 1.4*  < > 1.5* 1.7 1.4*  PHOS 2.8  --  2.6  --  4.0  < > = values in this interval not displayed. Glucose Profile:  Recent Labs  06/18/15 0404 06/18/15 0718 06/18/15 1109  GLUCAP 184* 177* 178*   Meds: senokot, miralax, magnesium sulfate, lantus, ss novolog, lasix, dulcoax suppository given today  Height:   Ht Readings from Last 1 Encounters:  05/12/2015 '5\' 7"'$  (1.702 m)    Weight:   Wt Readings from Last 1 Encounters:  06/18/15 246 lb 14.6 oz (112 kg)    BMI:  Body mass index is 38.66 kg/(m^2).  Estimated Nutritional Needs:   Kcal:  1254-1596kcals, (11-14kcals/kg) using current weight of 114kg)  Protein:  122-153 g (2.0-2.5 g/kg IBW) or 140-176 g (1.2-1.5 g/kg current wt)  Fluid:  1535-187m of fluid (25-319mkg)  EDUCATION NEEDS:   No education needs identified at this time  HIM[place] BeachRD, LDN ([phone]

## 2015-06-18 NOTE — Progress Notes (Signed)
ELECTROLYTE CONSULT NOTE - INITIAL  Pharmacy Consult for electrolyte monitoring  Allergies  Allergen Reactions  . Atorvastatin Other (See Comments)    Does not remember why she had Intolerance to Lipitor.  . Rosuvastatin Anxiety    Chest tigtness and feeling as if she had heartburn.    Patient Measurements: Height: '5\' 7"'$  (170.2 cm) Weight: 246 lb 14.6 oz (112 kg) IBW/kg (Calculated) : 61.6   Vital Signs: Temp: 98.6 F (37 C) (11/23 0500) Temp Source: Oral (11/23 0000) BP: 133/57 mmHg (11/23 0600) Pulse Rate: 120 (11/23 0600) Intake/Output from previous day: 11/22 0701 - 11/23 0700 In: 1689.8 [I.V.:244.8; NG/GT:1445] Out: 3025 [PVVZS:8270] Intake/Output from this shift: Total I/O In: 660 [NG/GT:660] Out: 2000 [Urine:2000]  Labs:  Recent Labs  06/10/2015 0857 05/29/2015 0444  WBC 9.7 11.9*  HGB 7.4* 7.8*  HCT 22.7* 24.6*  PLT 123* 158     Recent Labs  06/09/2015 0857 06/15/2015 0444 06/18/15 0459  NA 142 143 143  K 4.5 4.9 4.5  CL 101 99* 100*  CO2 37* 37* 37*  GLUCOSE 201* 211* 192*  BUN 25* 26* 29*  CREATININE 0.73 0.78 0.73  CALCIUM 8.1* 8.5* 8.3*  MG 1.5* 1.7 1.4*  PHOS 2.6  --  4.0   Estimated Creatinine Clearance: 88.1 mL/min (by C-G formula based on Cr of 0.73).    Recent Labs  06/01/2015 2011 06/13/2015 2359 06/18/15 0404  GLUCAP 194* 159* 184*    Medical History: Past Medical History  Diagnosis Date  . Anxiety   . Arthritis   . COPD (chronic obstructive pulmonary disease) ([place])   . CHF (congestive heart failure) ([place])   . Hyperlipidemia   . Hypertension   . Diabetes mellitus without complication ([place])   . Chronic kidney disease   . Neuromuscular disorder [facility])     Assessment: 69 year old female with SCLC and currently intubated. Pharmacy consulted for monitoring and managing electrolytes.  1122 0444 potassium 4.9, magnesium 1.7.   1123 0459 potassium 4.5, phosphorus 4, magnesium 1.4  Plan: Magnesium low. Give 4 gm magnesium  sulfate IV x 1 and will follow up with AM labs.   Marlee Trentman A. [place], [place].D., BCPS Clinical Pharmacist 06/18/2015

## 2015-06-18 NOTE — Therapy (Signed)
Responded to vent alarm. RR upper 40's, SpO2 90% on FiO2 .60, HR 158, nurse at bedside with antianxiety medication. FiO2 increased to .65 then .70. PEEP increased to +8. FiO2 titrated down to .65. SpO2 increased to 95%. HR decreased to 120's, RR decreased to 23. Dr.Simonds paged.

## 2015-06-18 NOTE — Progress Notes (Signed)
PT Cancellation Note  Patient Details Name: Emily Pratt MRN: [id number] DOB: Dec 23, 1945   Cancelled Treatment:    Reason Eval/Treat Not Completed: Medical issues which prohibited therapy (Evaluation re-attempted.  Patient lethargic/sedated and unable to participate with evaluation at this time.  Will continue efforts at later time/date as medically appropriate.)   [name] H. Owens Shark, PT, DPT, NCS 06/18/2015, 9:19 AM [phone]

## 2015-06-18 NOTE — Progress Notes (Signed)
RASS -4, not F/C. On vent. Poorly tolerant of weaning efforts. Episodic PSVT. Episodic desaturations. Internittent agitation  Filed Vitals:   06/18/15 1600 06/18/15 1623 06/18/15 1700 06/18/15 1800  BP: 134/72  124/71 128/61  Pulse: 98  111 94  Temp: 99.1 F (37.3 C)     TempSrc: Oral     Resp: '23  25 22  '$ Height:      Weight:      SpO2: 96% 96% 94% 96%   HEENT WNL Trach site clean Scattered rhonchi, prolonged exp phase Reg, no M Obese, soft, diminished BS Ext warm, symmetric edema   CMP Latest Ref Rng 06/18/2015 05/27/2015 06/19/2015  Glucose 65 - 99 mg/dL 192(H) 211(H) 201(H)  BUN 6 - 20 mg/dL 29(H) 26(H) 25(H)  Creatinine 0.44 - 1.00 mg/dL 0.73 0.78 0.73  Sodium 135 - 145 mmol/L 143 143 142  Potassium 3.5 - 5.1 mmol/L 4.5 4.9 4.5  Chloride 101 - 111 mmol/L 100(L) 99(L) 101  CO2 22 - 32 mmol/L 37(H) 37(H) 37(H)  Calcium 8.9 - 10.3 mg/dL 8.3(L) 8.5(L) 8.1(L)  Total Protein 6.5 - 8.1 g/dL - - -  Total Bilirubin 0.3 - 1.2 mg/dL - - -  Alkaline Phos 38 - 126 U/L - - -  AST 15 - 41 U/L - - -  ALT 14 - 54 U/L - - -    CBC    Component Value Date/Time   WBC 11.9* 06/15/2015 0444   RBC 2.68* 06/13/2015 0444   HGB 7.8* 06/12/2015 0444   HCT 24.6* 06/21/2015 0444   PLT 158 06/19/2015 0444   MCV 91.9 05/27/2015 0444   MCH 29.2 06/01/2015 0444   MCHC 31.7* 06/02/2015 0444   RDW 18.7* 06/22/2015 0444   LYMPHSABS 0.5* 06/03/2015 0539   MONOABS 0.0* 06/03/2015 0539   EOSABS 0.0 06/03/2015 0539   BASOSABS 0.0 06/03/2015 0539    CXR: [place] BLL opacities  IMPRESSION: Prolonged VDRF 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 Make sure to renew if indicated Cont scheduled enteral metoprolol PRN IV metoprolol to maintain HR < 115/min Monitor BMET intermittently Monitor I/Os Correct electrolytes as indicated Repeat Lasix X 2 doses 11/23 DVT px: SCDs Monitor CBC intermittently Transfuse per usual guidelines Cont Duragesic patch and PRN fentanyl pushes Add scheduled clonazepam 11/23 Cont PRN lorazepam Mobilize as able PT ordered 11/21  Merton Border, MD PCCM service Mobile [phone] Pager [phone]

## 2015-06-19 ENCOUNTER — Inpatient Hospital Stay: Payer: Medicare Other

## 2015-06-19 DIAGNOSIS — J9811 Atelectasis: Secondary | ICD-10-CM | POA: Insufficient documentation

## 2015-06-19 DIAGNOSIS — J441 Chronic obstructive pulmonary disease with (acute) exacerbation: Secondary | ICD-10-CM

## 2015-06-19 LAB — BLOOD GAS, ARTERIAL
ALLENS TEST (PASS/FAIL): POSITIVE — AB
Acid-Base Excess: 19.3 mmol/L — ABNORMAL HIGH (ref 0.0–3.0)
Bicarbonate: 43.7 mEq/L — ABNORMAL HIGH (ref 21.0–28.0)
FIO2: 65
O2 Saturation: 94.4 %
PEEP/CPAP: 8 cmH2O
PH ART: 7.55 — AB (ref 7.350–7.450)
Patient temperature: 37
RATE: 15 resp/min
VT: 500 mL
pCO2 arterial: 50 mmHg — ABNORMAL HIGH (ref 32.0–48.0)
pO2, Arterial: 63 mmHg — ABNORMAL LOW (ref 83.0–108.0)

## 2015-06-19 LAB — GLUCOSE, CAPILLARY
Glucose-Capillary: 150 mg/dL — ABNORMAL HIGH (ref 65–99)
Glucose-Capillary: 112 mg/dL — ABNORMAL HIGH (ref 65–99)
Glucose-Capillary: 147 mg/dL — ABNORMAL HIGH (ref 65–99)
Glucose-Capillary: 123 mg/dL — ABNORMAL HIGH (ref 65–99)
Glucose-Capillary: 96 mg/dL (ref 65–99)

## 2015-06-19 LAB — BASIC METABOLIC PANEL
ANION GAP: 10 (ref 5–15)
BUN: 32 mg/dL — ABNORMAL HIGH (ref 6–20)
CALCIUM: 8.7 mg/dL — AB (ref 8.9–10.3)
CO2: 40 mmol/L — ABNORMAL HIGH (ref 22–32)
Chloride: 93 mmol/L — ABNORMAL LOW (ref 101–111)
Creatinine, Ser: 0.78 mg/dL (ref 0.44–1.00)
Glucose, Bld: 151 mg/dL — ABNORMAL HIGH (ref 65–99)
Potassium: 4 mmol/L (ref 3.5–5.1)
SODIUM: 143 mmol/L (ref 135–145)

## 2015-06-19 LAB — CBC
HCT: 24.7 % — ABNORMAL LOW (ref 35.0–47.0)
Hemoglobin: 7.8 g/dL — ABNORMAL LOW (ref 12.0–16.0)
MCH: 28.5 pg (ref 26.0–34.0)
MCHC: 31.5 g/dL — AB (ref 32.0–36.0)
MCV: 90.4 fL (ref 80.0–100.0)
PLATELETS: 204 10*3/uL (ref 150–440)
RBC: 2.74 MIL/uL — ABNORMAL LOW (ref 3.80–5.20)
RDW: 18.6 % — AB (ref 11.5–14.5)
WBC: 12.7 10*3/uL — AB (ref 3.6–11.0)

## 2015-06-19 LAB — PHOSPHORUS: PHOSPHORUS: 4.8 mg/dL — AB (ref 2.5–4.6)

## 2015-06-19 LAB — MAGNESIUM: Magnesium: 1.9 mg/dL (ref 1.7–2.4)

## 2015-06-19 MED ORDER — METOCLOPRAMIDE HCL 5 MG/ML IJ SOLN
5.0000 mg | INTRAMUSCULAR | Status: DC | PRN
Start: 2015-06-19 — End: 2015-07-07
  Administered 2015-06-19: 5 mg via INTRAVENOUS
  Filled 2015-06-19: qty 2

## 2015-06-19 MED ORDER — FENTANYL CITRATE (PF) 100 MCG/2ML IJ SOLN
50.0000 ug | Freq: Once | INTRAMUSCULAR | Status: AC
  Administered 2015-06-19: 50 ug via INTRAVENOUS

## 2015-06-19 NOTE — Progress Notes (Addendum)
RASS -2,. On vent. Poorly tolerant of weaning efforts. Episodic PSVT. Episodic desaturations. Internittent agitation. FiO2 increased to 65%, had agitation overnight, got sedation (fent\versed)this AM. Large residuals, TF stopped.   Filed Vitals:   06/19/15 0500 06/19/15 0600 06/19/15 0700 06/19/15 0800  BP: 111/59 137/57  143/68  Pulse: 110 105  129  Temp:   99.2 F (37.3 C)   TempSrc:   Axillary   Resp: '25 21  29  '$ Height:      Weight:      SpO2: 95% 92%  90%   HEENT WNL Trach site clean Scattered rhonchi, prolonged exp phase Reg, no M Obese, soft, diminished BS Ext warm, symmetric edema Neuro - somnolent, awaken easily   CMP Latest Ref Rng 06/19/2015 06/18/2015 06/08/2015  Glucose 65 - 99 mg/dL 151(H) 192(H) 211(H)  BUN 6 - 20 mg/dL 32(H) 29(H) 26(H)  Creatinine 0.44 - 1.00 mg/dL 0.78 0.73 0.78  Sodium 135 - 145 mmol/L 143 143 143  Potassium 3.5 - 5.1 mmol/L 4.0 4.5 4.9  Chloride 101 - 111 mmol/L 93(L) 100(L) 99(L)  CO2 22 - 32 mmol/L 40(H) 37(H) 37(H)  Calcium 8.9 - 10.3 mg/dL 8.7(L) 8.3(L) 8.5(L)  Total Protein 6.5 - 8.1 g/dL - - -  Total Bilirubin 0.3 - 1.2 mg/dL - - -  Alkaline Phos 38 - 126 U/L - - -  AST 15 - 41 U/L - - -  ALT 14 - 54 U/L - - -    CBC    Component Value Date/Time   WBC 12.7* 06/19/2015 0330   RBC 2.74* 06/19/2015 0330   HGB 7.8* 06/19/2015 0330   HCT 24.7* 06/19/2015 0330   PLT 204 06/19/2015 0330   MCV 90.4 06/19/2015 0330   MCH 28.5 06/19/2015 0330   MCHC 31.5* 06/19/2015 0330   RDW 18.6* 06/19/2015 0330   LYMPHSABS 0.5* 06/03/2015 0539   MONOABS 0.0* 06/03/2015 0539   EOSABS 0.0 06/03/2015 0539   BASOSABS 0.0 06/03/2015 0539    CXR: Marie BLL opacities  IMPRESSION: Prolonged VDRF 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 Make sure to renew if indicated Cont scheduled enteral metoprolol PRN IV metoprolol to maintain HR < 115/min Monitor BMET intermittently Monitor I/Os Correct electrolytes as indicated Repeat Lasix X 2 doses 11/23 DVT px: SCDs Monitor CBC intermittently Transfuse per usual guidelines Cont Duragesic patch and PRN fentanyl pushes Add scheduled clonazepam 11/23 Cont PRN lorazepam Mobilize as able PT ordered 11/21 TF on hold for now due to large vomitus   Addendum - CXR with RLL collapse/atelectasis - bronchoscopy performed - thick mucus plugs removed from RMS and RBI - f\u post bronch CXR  I have personally obtained a history, examined the patient, evaluated laboratory and imaging results, formulated the assessment and plan and placed orders. CRITICAL CARE: The patient is critically ill with multiple organ systems failure and requires high complexity decision making for assessment and support, frequent evaluation and titration of therapies, application of advanced monitoring  technologies and extensive interpretation of multiple databases.   Critical Care Time devoted to patient care services described in this note is 35 minutes.    Vilinda Boehringer, MD [facility] Pager [phone] (please enter 7-digits) On Call Pager - [phone] (please enter 7-digits)

## 2015-06-19 NOTE — Progress Notes (Signed)
Patient with copious emesis during previous night shift, TF were stopped then. Restarted tube feeds at 1700 today.Checked OG residuals x 2 this shift, 300 ml and 150 ml. No dietician seen today, so restarted tube feeding at 30 ml/hr and 15 ml flush q four hours.

## 2015-06-19 NOTE — Progress Notes (Signed)
[facility] Progress Note Patient Name: Emily Pratt DOB: 1946/02/21 MRN: [id number]   Date of Service  06/19/2015  HPI/Events of Note  Vomited tube feeds.   eICU Interventions  Hold feeds. Reglan ordered.  Monitor qTC     Intervention Category Intermediate Interventions: Other:  Garnie Borchardt 06/19/2015, 12:28 AM

## 2015-06-19 NOTE — Progress Notes (Signed)
PT Cancellation Note  Patient Details Name: Emily Pratt MRN: [id number] DOB: 09-27-1945   Cancelled Treatment:    Reason Eval/Treat Not Completed: Medical issues which prohibited therapy. PT reviewed chart and discussed with RN. Patient has had elevated HR throughout the day and is not appropriate currently for any mobility. PT will continue to attempt at a later time/date as appropriate.   Kerman Passey, PT, DPT    06/19/2015, 11:31 AM

## 2015-06-19 NOTE — Progress Notes (Signed)
Pt vomited up large amount of tube feed and bile. Total bath and linen change done, tube feeds on hold.

## 2015-06-19 NOTE — Procedures (Signed)
Date: 06/19/2015, '@TIME'$     PHYSICIAN:  Tyree Fluharty  Indications/Preliminary Diagnosis: Respiratory failure, possible right lower lobe collapse  Consent: (Place X beside choice/s below): Emergent bronchoscopy secondary to right lower lobe collapse  The benefits, risks and possible complications of the procedure were        explained to:  ___ patient  ___ patient's family  ___ other:___________  who verbalized understanding and gave:  ___ verbal  ___ written  ___ verbal and written  ___ telephone  ___ other:________ consent.      Unable to obtain consent; procedure performed on emergent basis.     Other:       PRESEDATION ASSESSMENT: History and Physical has been performed. Patient meds and allergies have been reviewed. Presedation airway examination has been performed and documented. Baseline vital signs, sedation score, oxygenation status, and cardiac rhythm were reviewed. Patient was deemed to be in satisfactory condition to undergo the procedure.  PREMEDICATIONS:   Sedative/Narcotic Amt Dose   Versed  mg   Fentanyl 50 mcg  Diprivan  mg        Airway Prep (Place X beside choice below)   1% Transtracheal Lidocaine Anesthetization 7 cc   Patient prepped per Bronchoscopy Lab Policy       Insertion Route (Place X beside choice below)   Nasal   Oral  x Endotracheal Tube   Tracheostomy   INTRAPROCEDURE MEDICATIONS:  Sedative/Narcotic Amt Dose   Versed  mg   Fentanyl  mcg  Diprivan  mg       Medication Amt Dose  Medication Amt Dose  Xylocaine 2%  cc  Epinephrine 1:10,000 sol  cc  Xylocaine 4%  cc  Cocaine  cc   TECHNICAL PROCEDURES: (Place X beside choice below)   Procedures  Description    None     Electrocautery     Cryotherapy     Balloon Dilatation     Bronchography     Stent Placement   x  Therapeutic Aspiration     Laser/Argon Plasma    Brachytherapy Catheter Placement    Foreign Body Removal     SPECIMENS (Sites): (Place X beside choice below)  Specimens Description  x No Specimens Obtained     Washings    Lavage    Biopsies    Fine Needle Aspirates    Brushings    Sputum    FINDINGS: See below  ESTIMATED BLOOD LOSS: None  COMPLICATIONS/RESOLUTION:   PROCEDURE DETAILS: Timeout performed and correct patient, name, & ID confirmed. Following prep per Pulmonary policy, appropriate sedation was administered.  Airway exam proceeded with findings, technical procedures, and specimen collection as noted below. At the end of exam the scope was withdrawn without incident. Impression and Plan as noted below.   Inspection: Right airway: Thick mucus plugging noted throughout the right mainstem and right bronchus intermedius. Moderate erythema of the right airways.  Left airway: No endobronchial lesions, and no thick secretions, mild to moderate erythema throughout.   Procedure: Therapeutic aspiration of thick mucus plugging in the right mainstem and right bronchus intermedius. Mucous purulent and yellow to green in appearance  IMPLANTED DEVICE(S): None  IMPRESSION:POST-PROCEDURE DX: None  RECOMMENDATION/PLAN: Therapeutic aspiration of right mainstem and right bronchus intermedius, follow-up chest x-ray    ADDITIONAL COMMENTS: None   Procedure Time: Approximately 20 minutes   Vilinda Boehringer, MD [facility] Pager : [phone] (Please enter 7 digits)

## 2015-06-19 NOTE — Progress Notes (Signed)
ELECTROLYTE CONSULT NOTE - INITIAL  Pharmacy Consult for electrolyte monitoring  Allergies  Allergen Reactions  . Atorvastatin Other (See Comments)    Does not remember why she had Intolerance to Lipitor.  . Rosuvastatin Anxiety    Chest tigtness and feeling as if she had heartburn.    Patient Measurements: Height: '5\' 7"'$  (170.2 cm) Weight: 246 lb 7.6 oz (111.8 kg) IBW/kg (Calculated) : 61.6   Vital Signs: Temp: 98.9 F (37.2 C) (11/24 0400) BP: 126/67 mmHg (11/24 0400) Pulse Rate: 117 (11/24 0400) Intake/Output from previous day: 11/23 0701 - 11/24 0700 In: 817.5 [I.V.:7.5; NG/GT:710; IV Piggyback:100] Out: 4750 [Urine:4750] Intake/Output from this shift: Total I/O In: -  Out: 1000 [Urine:1000]  Labs:  Recent Labs  05/29/2015 0857 06/04/2015 0444 06/19/15 0330  WBC 9.7 11.9* 12.7*  HGB 7.4* 7.8* 7.8*  HCT 22.7* 24.6* 24.7*  PLT 123* 158 204     Recent Labs  06/06/2015 0857 06/13/2015 0444 06/18/15 0459 06/19/15 0330  NA 142 143 143 143  K 4.5 4.9 4.5 4.0  CL 101 99* 100* 93*  CO2 37* 37* 37* 40*  GLUCOSE 201* 211* 192* 151*  BUN 25* 26* 29* 32*  CREATININE 0.73 0.78 0.73 0.78  CALCIUM 8.1* 8.5* 8.3* 8.7*  MG 1.5* 1.7 1.4* 1.9  PHOS 2.6  --  4.0 4.8*   Estimated Creatinine Clearance: 88 mL/min (by C-G formula based on Cr of 0.78).    Recent Labs  06/18/15 2007 06/18/15 2357 06/19/15 0318  GLUCAP 178* 158* 150*    Medical History: Past Medical History  Diagnosis Date  . Anxiety   . Arthritis   . COPD (chronic obstructive pulmonary disease) ([place])   . CHF (congestive heart failure) ([place])   . Hyperlipidemia   . Hypertension   . Diabetes mellitus without complication ([place])   . Chronic kidney disease   . 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 Pharmacy consulted for monitoring and managing electrolytes.  1122 0444 potassium 4.9, magnesium 1.7.   1123 0459 potassium 4.5, phosphorus  4, magnesium 1.4  1124 0300 potassium 4, phosphorus 4.8, magnesium 1.9  Plan: Electrolytes within normal limits. No supplementation. Will recheck with AM labs.    Depaul Arizpe A. [place], [place].D., BCPS Clinical Pharmacist 06/19/2015

## 2015-06-20 ENCOUNTER — Inpatient Hospital Stay: Payer: Medicare Other

## 2015-06-20 ENCOUNTER — Encounter: Payer: Self-pay | Admitting: Radiology

## 2015-06-20 LAB — BASIC METABOLIC PANEL
ANION GAP: 8 (ref 5–15)
BUN: 32 mg/dL — ABNORMAL HIGH (ref 6–20)
CHLORIDE: 100 mmol/L — AB (ref 101–111)
CO2: 38 mmol/L — AB (ref 22–32)
Calcium: 8.4 mg/dL — ABNORMAL LOW (ref 8.9–10.3)
Creatinine, Ser: 0.8 mg/dL (ref 0.44–1.00)
GFR calc non Af Amer: >60 mL/min (ref >60)
Glucose, Bld: 119 mg/dL — ABNORMAL HIGH (ref 65–99)
POTASSIUM: 3.5 mmol/L (ref 3.5–5.1)
Sodium: 146 mmol/L — ABNORMAL HIGH (ref 135–145)

## 2015-06-20 LAB — CK
CK TOTAL: 8 U/L — AB (ref 38–234)
CK TOTAL: 9 U/L — AB (ref 38–234)
CK TOTAL: 10 U/L — AB (ref 38–234)

## 2015-06-20 LAB — GLUCOSE, CAPILLARY
Glucose-Capillary: 106 mg/dL — ABNORMAL HIGH (ref 65–99)
Glucose-Capillary: 106 mg/dL — ABNORMAL HIGH (ref 65–99)
Glucose-Capillary: 108 mg/dL — ABNORMAL HIGH (ref 65–99)
Glucose-Capillary: 129 mg/dL — ABNORMAL HIGH (ref 65–99)
Glucose-Capillary: 140 mg/dL — ABNORMAL HIGH (ref 65–99)
Glucose-Capillary: 144 mg/dL — ABNORMAL HIGH (ref 65–99)
Glucose-Capillary: 95 mg/dL (ref 65–99)

## 2015-06-20 LAB — CULTURE, RESPIRATORY W GRAM STAIN

## 2015-06-20 LAB — URINE CULTURE

## 2015-06-20 LAB — PHOSPHORUS: Phosphorus: 4.6 mg/dL (ref 2.5–4.6)

## 2015-06-20 LAB — TROPONIN I
TROPONIN I: 0.04 ng/mL — AB (ref <0.031)
Troponin I: 0.04 ng/mL — ABNORMAL HIGH (ref <0.031)
Troponin I: 0.04 ng/mL — ABNORMAL HIGH (ref <0.031)

## 2015-06-20 LAB — CULTURE, RESPIRATORY: CULTURE: NORMAL

## 2015-06-20 LAB — MAGNESIUM: MAGNESIUM: 1.8 mg/dL (ref 1.7–2.4)

## 2015-06-20 MED ORDER — CLONAZEPAM 0.5 MG PO TABS
0.5000 mg | ORAL_TABLET | Freq: Two times a day (BID) | ORAL | Status: DC
  Administered 2015-06-20 – 2015-07-15 (×46): 0.5 mg
  Filled 2015-06-20 (×47): qty 1

## 2015-06-20 MED ORDER — IOHEXOL 300 MG/ML  SOLN
75.0000 mL | Freq: Once | INTRAMUSCULAR | Status: AC | PRN
  Administered 2015-06-20: 75 mL via INTRAVENOUS

## 2015-06-20 MED ORDER — VITAL HIGH PROTEIN PO LIQD
1000.0000 mL | ORAL | Status: DC
  Administered 2015-06-20: 1000 mL

## 2015-06-20 MED ORDER — MIDAZOLAM HCL 2 MG/2ML IJ SOLN
1.0000 mg | INTRAMUSCULAR | Status: DC | PRN
  Administered 2015-06-20 – 2015-07-08 (×32): 1 mg via INTRAVENOUS
  Filled 2015-06-20 (×32): qty 2

## 2015-06-20 NOTE — Progress Notes (Signed)
Patient currently remains on vent and plan is for patient to receive chemo.  Per note from Care Manager. Clinical Social work will continue to follow patient and assist care management with going and discharge needs.  Casimer Lanius. Latanya Presser, MSW Clinical Social Work Department [phone] 10:44 AM

## 2015-06-20 NOTE — Progress Notes (Signed)
[facility]ology[facility]On[facility] Progress Note  Date of admission: 05/07/2015  Hospital day:  06/20/2015  Chief Complaint: MARCH JOOS is a 69 y.o. female who was admitted with acute on chronic respiratory failure in association with a large right lung mass.  Subjective: Patient remains intubated.  Less responsive today.  Moving left side only.  Social History: The patient is alone today.  Allergies:  Allergies  Allergen Reactions  . Atorvastatin Other (See Comments)    Does not remember why she had Intolerance to Lipitor.  . Rosuvastatin Anxiety    Chest tigtness and feeling as if she had heartburn.    Scheduled Medications: . acetylcysteine  3 mL Nebulization Q6H  . antiseptic oral rinse  7 mL Mouth Rinse 10 times per day  . budesonide (PULMICORT) nebulizer solution  0.5 mg Nebulization BID  . chlorhexidine gluconate  15 mL Mouth Rinse BID  . clonazePAM  0.5 mg Per Tube BID  . famotidine  20 mg Per Tube BID  . feeding supplement (PRO-STAT SUGAR FREE 64)  30 mL Per Tube Daily  . free water  200 mL Per Tube 3 times per day  . insulin aspart  0-20 Units Subcutaneous 6 times per day  . insulin glargine  20 Units Subcutaneous Daily  . levalbuterol  0.63 mg Nebulization Q6H  . metoprolol tartrate  100 mg Per Tube BID  . polyethylene glycol  17 g Oral Daily  . senna-docusate  1 tablet Per Tube BID    Review of Systems:  Unable to obtain as patient intubated.  Physical Exam: Blood pressure 161/96, pulse 111, temperature 100 F (37.8 C), temperature source Oral, resp. rate 30, height 5' 7"  (1.702 m), weight 229 lb 4.5 oz (104 kg), SpO2 96 %.  GENERAL:  Well developed, well nourished, woman lying in bed in the ICU intubated in no acute distress. MENTAL STATUS:  Does not follow commands. HEAD:  Normocephalic, atraumatic, face symmetric, no Cushingoid features. EYES:   Pupils equal round and reactive to light and accomodation.  No conjunctivitis or scleral  icterus. ENT:  Intubated.  RESPIRATORY:  Decreased respiratory excursion.  Decreased breath sounds right base. No wheezing. CARDIOVASCULAR:  Regular rate and rhythm without murmur, rub or gallop. ABDOMEN:  Soft, non-tender, with active bowel sounds, and no appreciable hepatosplenomegaly.  No masses. SKIN:  No rashes, ulcers or lesions. EXTREMITIES:  No edema.  No skin discoloration or tenderness.  No palpable cords. LYMPH NODES: No palpable cervical, supraclavicular, axillary or inguinal adenopathy  NEUROLOGICAL: Does not follow commands. ? facial asymmetry.  Moves left side with pain.  No clonus or Babinski. PSYCH:  Unable to assess.  Results for orders placed or performed during the hospital encounter of 05/04/2015 (from the past 48 hour(s))  Culture, blood (routine x 2)     Status: None (Preliminary result)   Collection Time: 06/18/15 12:10 PM  Result Value Ref Range   Specimen Description BLOOD  FLUID IN BODY    Special Requests      BOTTLES DRAWN AEROBIC AND ANAEROBIC AERO 1CC ANAERO    Culture NO GROWTH 2 DAYS    Report Status PENDING   Culture, respiratory (NON-Expectorated)     Status: None   Collection Time: 06/18/15 12:20 PM  Result Value Ref Range   Specimen Description TRACHEAL ASPIRATE    Special Requests NONE    Gram Stain      MANY WBC SEEN MODERATE GRAM VARIABLE ROD EXCELLENT SPECIMEN - 90-100% WBCS  Culture Consistent with normal respiratory flora.    Report Status 06/20/2015 FINAL   Urinalysis complete, with microscopic ([facility] only)     Status: Abnormal   Collection Time: 06/18/15  2:30 PM  Result Value Ref Range   Color, Urine STRAW (A) YELLOW   APPearance CLEAR (A) CLEAR   Glucose, UA NEGATIVE NEGATIVE mg/dL   Bilirubin Urine NEGATIVE NEGATIVE   Ketones, ur NEGATIVE NEGATIVE mg/dL   Specific Gravity, Urine 1.008 1.005 - 1.030   Hgb urine dipstick 1+ (A) NEGATIVE   pH 8.0 5.0 - 8.0   Protein, ur NEGATIVE NEGATIVE mg/dL   Nitrite NEGATIVE NEGATIVE    Leukocytes, UA NEGATIVE NEGATIVE   RBC / HPF 6-30 0 - 5 RBC/hpf   WBC, UA 0-5 0 - 5 WBC/hpf   Bacteria, UA NONE SEEN NONE SEEN   Squamous Epithelial / LPF 0-5 (A) NONE SEEN   Mucous PRESENT    Budding Yeast PRESENT   Urine culture     Status: None   Collection Time: 06/18/15  2:30 PM  Result Value Ref Range   Specimen Description URINE, CATHETERIZED    Special Requests NONE    Culture INSIGNIFICANT GROWTH    Report Status 06/20/2015 FINAL   Glucose, capillary     Status: Abnormal   Collection Time: 06/18/15  4:25 PM  Result Value Ref Range   Glucose-Capillary 175 (H) 65 - 99 mg/dL  Glucose, capillary     Status: Abnormal   Collection Time: 06/18/15  8:07 PM  Result Value Ref Range   Glucose-Capillary 178 (H) 65 - 99 mg/dL  Glucose, capillary     Status: Abnormal   Collection Time: 06/18/15 11:57 PM  Result Value Ref Range   Glucose-Capillary 158 (H) 65 - 99 mg/dL  Glucose, capillary     Status: Abnormal   Collection Time: 06/19/15  3:18 AM  Result Value Ref Range   Glucose-Capillary 150 (H) 65 - 99 mg/dL  Basic metabolic panel     Status: Abnormal   Collection Time: 06/19/15  3:30 AM  Result Value Ref Range   Sodium 143 135 - 145 mmol/L   Potassium 4.0 3.5 - 5.1 mmol/L   Chloride 93 (L) 101 - 111 mmol/L   CO2 40 (H) 22 - 32 mmol/L   Glucose, Bld 151 (H) 65 - 99 mg/dL   BUN 32 (H) 6 - 20 mg/dL   Creatinine, Ser 0.78 0.44 - 1.00 mg/dL   Calcium 8.7 (L) 8.9 - 10.3 mg/dL   GFR calc non Af Amer >60 >60 mL/min   GFR calc Af Amer >60 >60 mL/min    Comment: (NOTE) The eGFR has been calculated using the CKD EPI equation. This calculation has not been validated in all clinical situations. eGFR's persistently <60 mL/min signify possible Chronic Kidney Disease.    Anion gap 10 5 - 15  Magnesium     Status: None   Collection Time: 06/19/15  3:30 AM  Result Value Ref Range   Magnesium 1.9 1.7 - 2.4 mg/dL  Phosphorus     Status: Abnormal   Collection Time: 06/19/15  3:30 AM   Result Value Ref Range   Phosphorus 4.8 (H) 2.5 - 4.6 mg/dL  CBC     Status: Abnormal   Collection Time: 06/19/15  3:30 AM  Result Value Ref Range   WBC 12.7 (H) 3.6 - 11.0 K/uL   RBC 2.74 (L) 3.80 - 5.20 MIL/uL   Hemoglobin 7.8 (L) 12.0 - 16.0 g/dL  HCT 24.7 (L) 35.0 - 47.0 %   MCV 90.4 80.0 - 100.0 fL   MCH 28.5 26.0 - 34.0 pg   MCHC 31.5 (L) 32.0 - 36.0 g/dL   RDW 18.6 (H) 11.5 - 14.5 %   Platelets 204 150 - 440 K/uL  Glucose, capillary     Status: Abnormal   Collection Time: 06/19/15  7:33 AM  Result Value Ref Range   Glucose-Capillary 112 (H) 65 - 99 mg/dL  Blood gas, arterial     Status: Abnormal   Collection Time: 06/19/15  9:55 AM  Result Value Ref Range   FIO2 65.00    Mode PRESSURE REGULATED VOLUME CONTROL    VT 500 mL   LHR 15 resp/min   Peep/cpap 8.0 cm H20   pH, Arterial 7.55 (H) 7.350 - 7.450   pCO2 arterial 50 (H) 32.0 - 48.0 mmHg   pO2, Arterial 63 (L) 83.0 - 108.0 mmHg   Bicarbonate 43.7 (H) 21.0 - 28.0 mEq/L   Acid-Base Excess 19.3 (H) 0.0 - 3.0 mmol/L   O2 Saturation 94.4 %   Patient temperature 37.0    Collection site RIGHT RADIAL    Sample type ARTERIAL DRAW    Allens test (pass/fail) POSITIVE (A) PASS  Glucose, capillary     Status: Abnormal   Collection Time: 06/19/15 11:40 AM  Result Value Ref Range   Glucose-Capillary 147 (H) 65 - 99 mg/dL  Glucose, capillary     Status: Abnormal   Collection Time: 06/19/15  4:00 PM  Result Value Ref Range   Glucose-Capillary 123 (H) 65 - 99 mg/dL  Glucose, capillary     Status: None   Collection Time: 06/19/15  8:03 PM  Result Value Ref Range   Glucose-Capillary 96 65 - 99 mg/dL  Glucose, capillary     Status: None   Collection Time: 06/19/15 11:54 PM  Result Value Ref Range   Glucose-Capillary 95 65 - 99 mg/dL  Glucose, capillary     Status: Abnormal   Collection Time: 06/20/15  4:34 AM  Result Value Ref Range   Glucose-Capillary 129 (H) 65 - 99 mg/dL  Basic metabolic panel     Status: Abnormal    Collection Time: 06/20/15  5:35 AM  Result Value Ref Range   Sodium 146 (H) 135 - 145 mmol/L   Potassium 3.5 3.5 - 5.1 mmol/L   Chloride 100 (L) 101 - 111 mmol/L   CO2 38 (H) 22 - 32 mmol/L   Glucose, Bld 119 (H) 65 - 99 mg/dL   BUN 32 (H) 6 - 20 mg/dL   Creatinine, Ser 0.80 0.44 - 1.00 mg/dL   Calcium 8.4 (L) 8.9 - 10.3 mg/dL   GFR calc non Af Amer >60 >60 mL/min   GFR calc Af Amer >60 >60 mL/min    Comment: (NOTE) The eGFR has been calculated using the CKD EPI equation. This calculation has not been validated in all clinical situations. eGFR's persistently <60 mL/min signify possible Chronic Kidney Disease.    Anion gap 8 5 - 15  Phosphorus     Status: None   Collection Time: 06/20/15  5:35 AM  Result Value Ref Range   Phosphorus 4.6 2.5 - 4.6 mg/dL  Magnesium     Status: None   Collection Time: 06/20/15  5:35 AM  Result Value Ref Range   Magnesium 1.8 1.7 - 2.4 mg/dL  Glucose, capillary     Status: Abnormal   Collection Time: 06/20/15  7:44 AM  Result  Value Ref Range   Glucose-Capillary 108 (H) 65 - 99 mg/dL  Troponin I     Status: Abnormal   Collection Time: 06/20/15 10:20 AM  Result Value Ref Range   Troponin I 0.04 (H) <0.031 ng/mL    Comment: READ BACK AND VERIFIED WITH KENDRA SIMSER O N11/25/16 AT 1045 [place]        PERSISTENTLY INCREASED TROPONIN VALUES IN THE RANGE OF 0.04-0.49 ng/mL CAN BE SEEN IN:       -UNSTABLE ANGINA       -CONGESTIVE HEART FAILURE       -MYOCARDITIS       -CHEST TRAUMA       -ARRYHTHMIAS       -LATE PRESENTING MYOCARDIAL INFARCTION       -COPD   CLINICAL FOLLOW-UP RECOMMENDED.   CK     Status: Abnormal   Collection Time: 06/20/15 10:20 AM  Result Value Ref Range   Total CK 8 (L) 38 - 234 U/L   Dg Chest Port 1 View  06/20/2015  CLINICAL DATA:  Ventilator dependent respiratory failure. Hypoxia. Followup right pleural effusion and postobstructive atelectasis/pneumonia in the right lower lobe and right middle lobe. EXAM: PORTABLE  CHEST 1 VIEW COMPARISON:  06/19/2015 and earlier, including CT chest 06/06/2015. FINDINGS: Endotracheal tube tip in satisfactory position projecting approximately 6 cm above the carina. Right arm PICC tip projects at the junction of the right innominate vein and SVC, unchanged. Nasogastric tube courses below the diaphragm into the stomach. Since the 1114 hr examination yesterday, no change in the dense airspace consolidation in the right middle lobe and right lower lobe and the large right pleural effusion. Prominent bronchovascular markings in the left lung, unchanged, with improvement in the pulmonary venous hypertension. No new pulmonary parenchymal abnormalities. IMPRESSION: 1. Support apparatus satisfactory. 2. No change since yesterday in the dense atelectasis/pneumonia involving the right middle lobe and right lower lobe. 3. No change in the large right pleural effusion. 4. No new abnormalities. Electronically Signed   By: Evangeline Dakin M.D.   On: 06/20/2015 08:31   Dg Chest Port 1 View  06/19/2015  CLINICAL DATA:  Respiratory failure. Small cell carcinoma the lung. Right pleural effusion. EXAM: PORTABLE CHEST 1 VIEW 11:13 a.m. COMPARISON:  Radiographs from 06/08/2015 through 06/19/2015 at 5:42 a.m. and CT scan of the chest 06/06/2015 FINDINGS: The large right pleural effusion has decreased since the prior exam. PICC tip in the upper superior vena cava. Tracheostomy tube in good position. NG tube tip below the diaphragm. Left lung is clear. Pulmonary vascularity is normal. Right mediastinal adenopathy is noted. IMPRESSION: Decreased large right pleural effusion.  No pneumothorax. Electronically Signed   By: Lorriane Shire M.D.   On: 06/19/2015 11:29   Dg Chest Port 1 View  06/19/2015  CLINICAL DATA:  Respiratory failure with increasing difficulty breathing. History of COPD, diabetes and lung cancer. EXAM: PORTABLE CHEST 1 VIEW COMPARISON:  Radiographs 06/18/2015 and 06/15/2015.  CT 06/06/2015.  FINDINGS: 0954 hour. Right arm PICC has been slightly advanced to the level of the upper SVC. Tracheostomy and nasogastric tube appear unchanged. There is progressive volume loss and opacification in the right hemithorax consistent with progressive partial lung collapse. Underlying right pleural effusion appears unchanged. There is improved aeration of the left lung. The heart size appears unchanged. IMPRESSION: Progressive partial right lung collapse with increased pleural parenchymal opacities on the right hemithorax. Support system positioned as above. Electronically Signed   By: Caryl Comes.D.  On: 06/19/2015 10:11    Assessment:  Emily Pratt is a 69 y.o. 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  Chest CT on 06/06/2015  revealed a response to therapy.  She has been extubated and reintubated.  Tracheostomy performed on 05/31/2015.  Counts on 06/19/2015 adequate.  Anemic.  Neurologically, less responsive.  Plan: 1. Hematology/Oncology:  Anticipate cycle #2 cisplatin and etoposide on 06/23/2015.  Anemia evaluation in AM. 2. Pulmonary:  Remains intubated.  Appreciate pulmonary/critical care management.  Weaning as tolerated. 3. Fluids/electrolytes/nutrition:  Tube feeds on hold secondary to large emesis.  Plan on restarting at a lower rate. 4. Neurology:  Head CT today. 5. Disposition:  Anticipate long term plans/discussion with family and Dr. Grayland Ormond next week regarding goals.   Lequita Asal, MD  06/20/2015, 12:01 PM

## 2015-06-20 NOTE — Progress Notes (Signed)
ELECTROLYTE CONSULT NOTE - INITIAL  Pharmacy Consult for electrolyte monitoring  Allergies  Allergen Reactions  . Atorvastatin Other (See Comments)    Does not remember why she had Intolerance to Lipitor.  . Rosuvastatin Anxiety    Chest tigtness and feeling as if she had heartburn.    Patient Measurements: Height: '5\' 7"'$  (170.2 cm) Weight: 229 lb 4.5 oz (104 kg) IBW/kg (Calculated) : 61.6   Vital Signs: Temp: 99.3 F (37.4 C) (11/25 0000) Temp Source: Oral (11/25 0000) BP: 124/89 mmHg (11/25 0600) Pulse Rate: 117 (11/25 0600) Intake/Output from previous day: 11/24 0701 - 11/25 0700 In: 360 [NG/GT:360] Out: 1900 [Urine:1900] Intake/Output from this shift: Total I/O In: 300 [NG/GT:300] Out: 900 [Urine:900]  Labs:  Recent Labs  06/19/15 0330  WBC 12.7*  HGB 7.8*  HCT 24.7*  PLT 204     Recent Labs  06/18/15 0459 06/19/15 0330 06/20/15 0535  NA 143 143 146*  K 4.5 4.0 3.5  CL 100* 93* 100*  CO2 37* 40* 38*  GLUCOSE 192* 151* 119*  BUN 29* 32* 32*  CREATININE 0.73 0.78 0.80  CALCIUM 8.3* 8.7* 8.4*  MG 1.4* 1.9 1.8  PHOS 4.0 4.8* 4.6   Estimated Creatinine Clearance: 84.7 mL/min (by C-G formula based on Cr of 0.8).    Recent Labs  06/19/15 2003 06/19/15 2354 06/20/15 0434  GLUCAP 96 95 129*    Medical History: Past Medical History  Diagnosis Date  . Anxiety   . Arthritis   . COPD (chronic obstructive pulmonary disease) ([place])   . CHF (congestive heart failure) ([place])   . Hyperlipidemia   . Hypertension   . Diabetes mellitus without complication ([place])   . Chronic kidney disease   . 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 Pharmacy consulted for monitoring and managing electrolytes.  1122 0444 potassium 4.9, magnesium 1.7.   1123 0459 potassium 4.5, phosphorus 4, magnesium 1.4  1124 0300 potassium 4, phosphorus 4.8, magnesium 1.9  1125 0535 potassium 3.5, phosphorus 4.6,  magnesium 1.8  Plan: Electrolytes within normal limits. No supplementation. Will recheck BMP with AM labs and magnesium/phos in 2 days    Ovid Curd A. Fellows, [place].D., BCPS Clinical Pharmacist 06/20/2015

## 2015-06-20 NOTE — Progress Notes (Signed)
PT Cancellation Note  Patient Details Name: Emily Pratt MRN: [id number] DOB: [birth year]   Cancelled Treatment:    Reason Eval/Treat Not Completed: Patient not medically ready. Chart reviewed, RN consulted. Holding pt treatment at this time at recommendation of RN, who reports pt has been lethargic and anxious. Will attempt at later date/time.     George Alcantar C 06/20/2015, 10:07 AM  10:07 AM  Etta Grandchild, PT, DPT [place] License # [id number]

## 2015-06-20 NOTE — Progress Notes (Signed)
Nutrition Follow-up    INTERVENTION:   EN: discussed nutritional poc during ICU rounds, MD Mungal wanting to continue TF at rate of 30 ml/hr for now, goal rate is 60 ml/hr. If pt continues to have episode of vomitting, may benefit from abdominal xray to further assess. Noted order for reglan prn, may benefit from changing to scheduled dosing. Will continue to assess   NUTRITION DIAGNOSIS:   Inadequate oral intake related to acute illness as evidenced by NPO status.  GOAL:   Patient will meet greater than or equal to 90% of their needs  MONITOR:    (Energy Intake, Anthropometrics, Electrolyte/Renal Profile, Digestive System, Electrolyte/Renal Profile)  REASON FOR ASSESSMENT:   Consult Enteral/tube feeding initiation and management  ASSESSMENT:     Pt remains on vent via trach, not tolerating weaning trials, intermittently agitated, change in mentation from previous baseline with CT head pending today  EN: noted TF held 2 nights ago due to larte amounts of emesis, TF restarted yesterday at rate of 30 ml/hr, appears to be tolerating, residuals this AM 80 ml, no vomiting  Digestive System: abdomen soft, last BM 11/23  Skin:   (Stage II pressure ulcer on sacrum)  Electrolyte and Renal Profile:  Recent Labs Lab 06/18/15 0459 06/19/15 0330 06/20/15 0535  BUN 29* 32* 32*  CREATININE 0.73 0.78 0.80  NA 143 143 146*  K 4.5 4.0 3.5  MG 1.4* 1.9 1.8  PHOS 4.0 4.8* 4.6   Glucose Profile:  Recent Labs  06/20/15 0434 06/20/15 0744 06/20/15 1157  GLUCAP 129* 108* 144*   Meds: reglan prn added, senokot, miralax, lantus, ss novolog  Height:   Ht Readings from Last 1 Encounters:  05/02/2015 '5\' 7"'$  (1.702 m)    Weight:   Wt Readings from Last 1 Encounters:  06/20/15 229 lb 4.5 oz (104 kg)    Filed Weights   06/18/15 0505 06/19/15 0400 06/20/15 0542  Weight: 246 lb 14.6 oz (112 kg) 246 lb 7.6 oz (111.8 kg) 229 lb 4.5 oz (104 kg)    BMI:  Body mass index is 35.9  kg/(m^2).  Estimated Nutritional Needs:   Kcal:  1254-1596kcals, (11-14kcals/kg) using current weight of 114kg)  Protein:  122-153 g (2.0-2.5 g/kg IBW) or 140-176 g (1.2-1.5 g/kg current wt)  Fluid:  1535-1852m of fluid (25-339mkg)  EDUCATION NEEDS:   No education needs identified at this time  HISloanRD, LDN [phone]

## 2015-06-20 NOTE — Progress Notes (Signed)
RASS -2,. On vent. Poorly tolerant of weaning efforts. Episodic PSVT. Episodic desaturations. Internittent agitation. FiO2 increased to 65%, had agitation overnight, got sedation (fent\versed)this AM. Large residuals, TF stopped.   Filed Vitals:   06/20/15 0500 06/20/15 0542 06/20/15 0600 06/20/15 0700  BP:   124/89 145/77  Pulse: 106  117 105  Temp:    100 F (37.8 C)  TempSrc:      Resp: '21  27 26  '$ Height:      Weight:  229 lb 4.5 oz (104 kg)    SpO2: 98%  96% 96%   HEENT WNL Trach site clean Scattered rhonchi, prolonged exp phase Reg, no M Obese, soft, diminished BS Ext warm, symmetric edema Neuro - somnolent, awaken easily, mild right sided weakness   CMP Latest Ref Rng 06/20/2015 06/19/2015 06/18/2015  Glucose 65 - 99 mg/dL 119(H) 151(H) 192(H)  BUN 6 - 20 mg/dL 32(H) 32(H) 29(H)  Creatinine 0.44 - 1.00 mg/dL 0.80 0.78 0.73  Sodium 135 - 145 mmol/L 146(H) 143 143  Potassium 3.5 - 5.1 mmol/L 3.5 4.0 4.5  Chloride 101 - 111 mmol/L 100(L) 93(L) 100(L)  CO2 22 - 32 mmol/L 38(H) 40(H) 37(H)  Calcium 8.9 - 10.3 mg/dL 8.4(L) 8.7(L) 8.3(L)  Total Protein 6.5 - 8.1 g/dL - - -  Total Bilirubin 0.3 - 1.2 mg/dL - - -  Alkaline Phos 38 - 126 U/L - - -  AST 15 - 41 U/L - - -  ALT 14 - 54 U/L - - -    CBC    Component Value Date/Time   WBC 12.7* 06/19/2015 0330   RBC 2.74* 06/19/2015 0330   HGB 7.8* 06/19/2015 0330   HCT 24.7* 06/19/2015 0330   PLT 204 06/19/2015 0330   MCV 90.4 06/19/2015 0330   MCH 28.5 06/19/2015 0330   MCHC 31.5* 06/19/2015 0330   RDW 18.6* 06/19/2015 0330   LYMPHSABS 0.5* 06/03/2015 0539   MONOABS 0.0* 06/03/2015 0539   EOSABS 0.0 06/03/2015 0539   BASOSABS 0.0 06/03/2015 0539    CXR: [place] BLL opacities  IMPRESSION: Prolonged VDRF 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 Make sure to renew if indicated Cont scheduled enteral metoprolol PRN IV metoprolol to maintain HR < 115/min Monitor BMET intermittently, check CE Monitor I/Os Correct electrolytes as indicated Repeat Lasix X 2 doses 11/23 DVT px: SCDs Monitor CBC intermittently Transfuse per usual guidelines Cont Duragesic patch and PRN fentanyl pushes Add scheduled clonazepam 11/23 Cont PRN lorazepam Mobilize as able PT ordered 11/21 TF were on hold secondary to large vomitus - can restart at lower dose.  CT head today w\wo contrast   I have personally obtained a history, examined the patient, evaluated laboratory and imaging results, formulated the assessment and plan and placed orders. CRITICAL CARE: The patient is critically ill with multiple organ systems failure and requires high complexity decision making for assessment and support, frequent evaluation and titration of therapies, application of advanced monitoring technologies and  extensive interpretation of multiple databases.   Critical Care Time devoted to patient care services described in this note is 35 minutes.    Vilinda Boehringer, MD [facility] Pager [phone] (please enter 7-digits) On Call Pager - [phone] (please enter 7-digits)

## 2015-06-21 DIAGNOSIS — J0100 Acute maxillary sinusitis, unspecified: Secondary | ICD-10-CM

## 2015-06-21 LAB — IRON AND TIBC
Iron: 24 ug/dL — ABNORMAL LOW (ref 28–170)
Saturation Ratios: 12 % (ref 10.4–31.8)
TIBC: 203 ug/dL — ABNORMAL LOW (ref 250–450)
UIBC: 179 ug/dL

## 2015-06-21 LAB — BASIC METABOLIC PANEL
ANION GAP: 8 (ref 5–15)
BUN: 31 mg/dL — ABNORMAL HIGH (ref 6–20)
CALCIUM: 8.5 mg/dL — AB (ref 8.9–10.3)
CO2: 35 mmol/L — ABNORMAL HIGH (ref 22–32)
Chloride: 103 mmol/L (ref 101–111)
Creatinine, Ser: 0.8 mg/dL (ref 0.44–1.00)
Glucose, Bld: 128 mg/dL — ABNORMAL HIGH (ref 65–99)
Potassium: 3.2 mmol/L — ABNORMAL LOW (ref 3.5–5.1)
Sodium: 146 mmol/L — ABNORMAL HIGH (ref 135–145)

## 2015-06-21 LAB — TSH: TSH: 1.374 u[IU]/mL (ref 0.350–4.500)

## 2015-06-21 LAB — CBC WITH DIFFERENTIAL/PLATELET
Basophils Absolute: 0.1 10*3/uL (ref 0–0.1)
Basophils Relative: 1 %
Eosinophils Absolute: 0 10*3/uL (ref 0–0.7)
Eosinophils Relative: 0 %
HCT: 24.6 % — ABNORMAL LOW (ref 35.0–47.0)
Hemoglobin: 8 g/dL — ABNORMAL LOW (ref 12.0–16.0)
Lymphocytes Relative: 9 %
Lymphs Abs: 0.9 10*3/uL — ABNORMAL LOW (ref 1.0–3.6)
MCH: 29.4 pg (ref 26.0–34.0)
MCHC: 32.4 g/dL (ref 32.0–36.0)
MCV: 90.7 fL (ref 80.0–100.0)
Monocytes Absolute: 0.6 10*3/uL (ref 0.2–0.9)
Monocytes Relative: 7 %
Neutro Abs: 8 10*3/uL — ABNORMAL HIGH (ref 1.4–6.5)
Neutrophils Relative %: 83 %
Platelets: 256 10*3/uL (ref 150–440)
RBC: 2.71 MIL/uL — ABNORMAL LOW (ref 3.80–5.20)
RDW: 18.4 % — ABNORMAL HIGH (ref 11.5–14.5)
WBC: 9.6 10*3/uL (ref 3.6–11.0)

## 2015-06-21 LAB — RETICULOCYTES
RBC.: 2.71 MIL/uL — ABNORMAL LOW (ref 3.80–5.20)
Retic Count, Absolute: 168 10*3/uL (ref 19.0–183.0)
Retic Ct Pct: 6.2 % — ABNORMAL HIGH (ref 0.4–3.1)

## 2015-06-21 LAB — GLUCOSE, CAPILLARY
Glucose-Capillary: 100 mg/dL — ABNORMAL HIGH (ref 65–99)
Glucose-Capillary: 111 mg/dL — ABNORMAL HIGH (ref 65–99)
Glucose-Capillary: 112 mg/dL — ABNORMAL HIGH (ref 65–99)
Glucose-Capillary: 122 mg/dL — ABNORMAL HIGH (ref 65–99)
Glucose-Capillary: 124 mg/dL — ABNORMAL HIGH (ref 65–99)
Glucose-Capillary: 140 mg/dL — ABNORMAL HIGH (ref 65–99)

## 2015-06-21 LAB — FOLATE: Folate: 10.6 ng/mL (ref >5.9)

## 2015-06-21 LAB — FERRITIN: Ferritin: 335 ng/mL — ABNORMAL HIGH (ref 11–307)

## 2015-06-21 MED ORDER — POTASSIUM CHLORIDE 20 MEQ PO PACK
20.0000 meq | PACK | ORAL | Status: AC
  Administered 2015-06-21 (×3): 20 meq
  Filled 2015-06-21 (×3): qty 1

## 2015-06-21 MED ORDER — VITAL HIGH PROTEIN PO LIQD
1000.0000 mL | ORAL | Status: DC
  Administered 2015-06-21 – 2015-06-22 (×2): 1000 mL

## 2015-06-21 MED ORDER — AMOXICILLIN-POT CLAVULANATE 400-57 MG/5ML PO SUSR
500.0000 mg | Freq: Two times a day (BID) | ORAL | Status: AC
  Administered 2015-06-21 – 2015-06-26 (×11): 500 mg via ORAL
  Filled 2015-06-21 (×6): qty 6.3
  Filled 2015-06-21: qty 10
  Filled 2015-06-21: qty 6.3
  Filled 2015-06-21: qty 10
  Filled 2015-06-21 (×4): qty 6.3

## 2015-06-21 NOTE — Progress Notes (Signed)
Nutrition Follow-up     INTERVENTION:  EN: Discussed with MD Mungal and agreeable to increasing tube feeding to 75m/hr today of vital high protein, goal rate is 658mhr.     NUTRITION DIAGNOSIS:   Inadequate oral intake related to acute illness as evidenced by NPO status.    GOAL:   Patient will meet greater than or equal to 90% of their needs    MONITOR:    (Energy Intake, Anthropometrics, Electrolyte/Renal Pratt, Digestive System, Electrolyte/Renal Pratt)  REASON FOR ASSESSMENT:   Consult Enteral/tube feeding initiation and management  ASSESSMENT:      Pt remains on vent.     Current Nutrition: tolerating vital high protein at 3067mr, no vomiting per RN, Emily Pratt:residual 61m12mis am, abdomen soft,  Last BM: noted small BM this am and large loose BM 11/25   Scheduled Medications:  . antiseptic oral rinse  7 mL Mouth Rinse 10 times per day  . budesonide (PULMICORT) nebulizer solution  0.5 mg Nebulization BID  . chlorhexidine gluconate  15 mL Mouth Rinse BID  . clonazePAM  0.5 mg Per Tube BID  . famotidine  20 mg Per Tube BID  . feeding supplement (PRO-STAT SUGAR FREE 64)  30 mL Per Tube Daily  . free water  200 mL Per Tube 3 times per day  . insulin aspart  0-20 Units Subcutaneous 6 times per day  . insulin glargine  20 Units Subcutaneous Daily  . levalbuterol  0.63 mg Nebulization Q6H  . metoprolol tartrate  100 mg Per Tube BID  . polyethylene glycol  17 g Oral Daily  . senna-docusate  1 tablet Per Tube BID    Continuous Medications:  . feeding supplement (VITAL HIGH PROTEIN) 1,000 mL (06/21/15 0600)     Electrolyte/Renal Pratt and Glucose Pratt:   Recent Labs Lab 06/18/15 0459 06/19/15 0330 06/20/15 0535 06/21/15 0610  NA 143 143 146* 146*  K 4.5 4.0 3.5 3.2*  CL 100* 93* 100* 103  CO2 37* 40* 38* 35*  BUN 29* 32* 32* 31*  CREATININE 0.73 0.78 0.80 0.80  CALCIUM 8.3* 8.7* 8.4* 8.5*  MG 1.4* 1.9  1.8  --   PHOS 4.0 4.8* 4.6  --   GLUCOSE 192* 151* 119* 128*        Weight Trend since Admission: Filed Weights   06/19/15 0400 06/20/15 0542 06/21/15 0500  Weight: 246 lb 7.6 oz (111.8 kg) 229 lb 4.5 oz (104 kg) 229 lb 4.5 oz (104 kg)      Diet Order:   NPO  Skin:   (Stage II pressure ulcer on sacrum)   Height:   Ht Readings from Last 1 Encounters:  05/10/2015 '5\' 7"'$  (1.702 m)    Weight:   Wt Readings from Last 1 Encounters:  06/21/15 229 lb 4.5 oz (104 kg)    Ideal Body Weight:     BMI:  Body mass index is 35.9 kg/(m^2).  Estimated Nutritional Needs:   Kcal:  1254-1596kcals, (11-14kcals/kg) using current weight of 114kg)  Protein:  122-153 g (2.0-2.5 g/kg IBW) or 140-176 g (1.2-1.5 g/kg current wt)  Fluid:  1535-1842mL35mfluid (25-30mL/77m EDUCATION NEEDS:   No education needs identified at this time  HIGH Care Level  Emily Pratt Emily Pratt,Emily [email or web address])

## 2015-06-21 NOTE — Progress Notes (Signed)
Pt gastric residual checked: 244m's. Gastric residual returned to the pt. Jamille Yoshino E 4:29 PM 06/21/2015

## 2015-06-21 NOTE — Evaluation (Signed)
Physical Therapy Re-Evaluation Patient Details Name: Emily Pratt MRN: [id number] DOB: 12/12/1945 Today's Date: 06/21/2015   History of Present Illness  presented to ER with SOB, requiring BiPAP upon arrival; admitted with acute on chronic respiratory failure with hypercapnia, requiring intubation 10/25 (and subsequent failure to wean).  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  Has now completed once course of chemo (10/31) during hospitalization.  Pending trach and PEG placement when medically appropriate (thrombocytopenia currently contraindicating).  Clinical Impression  Pt continues to be on vent with new trach.  She is unable to verbalize and is so weak in her UEs that she is not able to write on clip board to communicate.  She was willing to do exercises and we spent most of the session with gentle AAROM LE exercises.  Pt appears to fatigue quickly with this minimal activity.     Follow Up Recommendations SNF;LTACH (per progress)    Equipment Recommendations       Recommendations for Other Services       Precautions / Restrictions Precautions Precautions: Fall Precaution Comments:  (pt remains on vent via trach) Restrictions Weight Bearing Restrictions: No      Mobility  Bed Mobility               General bed mobility comments: deferred secondary to ventilatory status  Transfers                 General transfer comment: deferred secondary to ventilatory status  Ambulation/Gait             General Gait Details: deferred secondary to ventilatory status  Stairs            Wheelchair Mobility    Modified Rankin (Stroke Patients Only)       Balance                                             Pertinent Vitals/Pain Pain Assessment:  (does not indicate pain during exercises)    Home Living Family/patient expects to be discharged to:: Skilled nursing facility                      Prior Function Level of Independence: Independent with assistive device(s)         Comments: Indep with intermittent use of SPC vs. 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)  Cognition Arousal/Alertness: Awake/alert Behavior During Therapy: WFL for tasks assessed/performed                        General Comments      Exercises General Exercises - Lower Extremity Ankle Circles/Pumps: AROM;10 reps;Both Heel Slides: AAROM;AROM;10 reps;Both Hip ABduction/ADduction: AAROM;Both;10 reps      Assessment/Plan    PT Assessment Patient needs continued PT services  PT Diagnosis Difficulty walking;Generalized weakness   PT Problem List Decreased strength;Decreased range of motion;Decreased  activity tolerance;Decreased balance;Decreased mobility;Decreased knowledge of use of DME;Decreased safety awareness;Decreased knowledge of precautions;Cardiopulmonary status limiting activity  PT Treatment Interventions DME instruction;Gait training;Stair training;Functional mobility training;Therapeutic exercise;Therapeutic activities;Balance training;Patient/family education   PT Goals (Current goals can be found in the Care Plan section) Acute Rehab PT Goals Patient Stated Goal: unable to verbalize  Time For Goal Achievement: 07/05/15 Potential to Achieve Goals: Fair    Frequency Min 2X/week   Barriers to discharge        Co-evaluation               End of Session   Activity Tolerance: Patient limited by fatigue;Patient limited by lethargy Patient left: in bed;with call bell/phone within reach           Time:  3295-1884 PT Time Calculation (min) (ACUTE ONLY): 13 min   Charges:     PT Treatments $Therapeutic Exercise: 8-22 mins   PT G Codes:       Wayne Both, PT, DPT [phone]  Kreg Shropshire 06/21/2015, 4:12 PM

## 2015-06-21 NOTE — Progress Notes (Signed)
Pt gastric residual checked: 52m's. Gastric residual returned. Emily Pratt E 3:17 PM 06/21/2015

## 2015-06-21 NOTE — Consult Note (Signed)
Electrolyte CONSULT NOTE - FOLLOW UP  Pharmacy Consult for Electrolyte monitoring and replacement  Patient Measurements: Height: '5\' 7"'$  (170.2 cm) Weight: 229 lb 4.5 oz (104 kg) IBW/kg (Calculated) : 61.6  Vital Signs: Temp: 98.8 F (37.1 C) (11/26 0445) Temp Source: Oral (11/26 0700) BP: 130/65 mmHg (11/26 1100) Pulse Rate: 113 (11/26 1100)    Recent Labs  06/19/15 0330 06/20/15 0535 06/21/15 0610  NA 143 146* 146*  K 4.0 3.5 3.2*  CL 93* 100* 103  CO2 40* 38* 35*  GLUCOSE 151* 119* 128*  BUN 32* 32* 31*  CREATININE 0.78 0.80 0.80  CALCIUM 8.7* 8.4* 8.5*  MG 1.9 1.8  --   PHOS 4.8* 4.6  --    Estimated Creatinine Clearance: 84.7 mL/min (by C-G formula based on Cr of 0.8).    Recent Labs  06/21/15 0502 06/21/15 0708 06/21/15 1119  GLUCAP 122* 112* 140*     Assessment: 69 yo female with SCLC current;y intubated in ICU. Pharmacy consulted for monitoring and managing electrolytes.   1124 0300 potassium 4, phosphorus 4.8, magnesium 1.9  1125 0535 potassium 3.5, phosphorus 4.6, magnesium 1.8  1126 0610 potassium 3.2  Plan:  Will supplement with 60 mEq K+ via tube. All other levels WNL. BMP ordered for 0500 tomorrow AM with Mg level.  Pharmacy will continue to monitor labs and replace as needed.  Nancy Fetter, PharmD Pharmacy Resident  06/21/2015,12:14 PM

## 2015-06-21 NOTE — Progress Notes (Signed)
RASS -1,. On vent. Poorly tolerant of weaning efforts. Episodic PSVT. Episodic desaturations. Intermittent agitation. FiO2 decreased to 28%, no significant agitation lastnight, awake and more alert this morning. CT head with no lesion, but b/l mastoid effusions  Filed Vitals:   06/21/15 0600 06/21/15 0700 06/21/15 0742 06/21/15 0800  BP: 153/95   143/77  Pulse: 119   109  Temp:      TempSrc:  Oral    Resp: 24   27  Height:      Weight:      SpO2: 98%  94% 92%   HEENT WNL Trach site clean Scattered rhonchi, prolonged exp phase Reg, no M Obese, soft, diminished BS Ext warm, symmetric edema Neuro - mild right sided weakness, more awake and alert, following simple commands   CMP Latest Ref Rng 06/21/2015 06/20/2015 06/19/2015  Glucose 65 - 99 mg/dL 128(H) 119(H) 151(H)  BUN 6 - 20 mg/dL 31(H) 32(H) 32(H)  Creatinine 0.44 - 1.00 mg/dL 0.80 0.80 0.78  Sodium 135 - 145 mmol/L 146(H) 146(H) 143  Potassium 3.5 - 5.1 mmol/L 3.2(L) 3.5 4.0  Chloride 101 - 111 mmol/L 103 100(L) 93(L)  CO2 22 - 32 mmol/L 35(H) 38(H) 40(H)  Calcium 8.9 - 10.3 mg/dL 8.5(L) 8.4(L) 8.7(L)  Total Protein 6.5 - 8.1 g/dL - - -  Total Bilirubin 0.3 - 1.2 mg/dL - - -  Alkaline Phos 38 - 126 U/L - - -  AST 15 - 41 U/L - - -  ALT 14 - 54 U/L - - -    CBC    Component Value Date/Time   WBC 9.6 06/21/2015 0610   RBC 2.71* 06/21/2015 0610   RBC 2.71* 06/21/2015 0610   HGB 8.0* 06/21/2015 0610   HCT 24.6* 06/21/2015 0610   PLT 256 06/21/2015 0610   MCV 90.7 06/21/2015 0610   MCH 29.4 06/21/2015 0610   MCHC 32.4 06/21/2015 0610   RDW 18.4* 06/21/2015 0610   LYMPHSABS 0.9* 06/21/2015 0610   MONOABS 0.6 06/21/2015 0610   EOSABS 0.0 06/21/2015 0610   BASOSABS 0.1 06/21/2015 0610    CXR: [place] BLL opacities  CT head 11/25>> no IC lesion, bilateral Mastoid effusion  Significant Events 11/21-11/27 11/22>>bronchoscopy, thick secretions cleared 11/24>>bronchoscopy for RLL, RML collapse>>large mucus plugs  removed.   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 Make sure to renew if indicated Cont scheduled enteral metoprolol PRN IV metoprolol to maintain HR < 115/min Monitor BMET intermittently, check CE Monitor I/Os Correct electrolytes as indicated DVT px: SCDs Monitor CBC intermittently Transfuse per usual guidelines Patient with depressed mentation - all sedation and agitation meds reduce by 50%, fentanyl patch removed - now more alert and awake Augmentin for sinusitis seen on head CT.  TF restarted   I have personally obtained a history, examined the patient, evaluated laboratory and imaging results, formulated the assessment and plan and placed orders. CRITICAL CARE: The patient is critically ill with multiple organ systems failure and requires high complexity decision making for assessment and support, frequent  evaluation and titration of therapies, application of advanced monitoring technologies and extensive interpretation of multiple databases.   Critical Care Time devoted to patient care services described in this note is 35 minutes.    Vilinda Boehringer, MD [facility] Pager [phone] (please enter 7-digits) On Call Pager - [phone] (please enter 7-digits)

## 2015-06-22 ENCOUNTER — Inpatient Hospital Stay: Payer: Medicare Other

## 2015-06-22 DIAGNOSIS — J0410 Acute tracheitis without obstruction: Secondary | ICD-10-CM

## 2015-06-22 DIAGNOSIS — J0120 Acute ethmoidal sinusitis, unspecified: Secondary | ICD-10-CM

## 2015-06-22 LAB — BASIC METABOLIC PANEL
ANION GAP: 7 (ref 5–15)
BUN: 29 mg/dL — AB (ref 6–20)
CHLORIDE: 106 mmol/L (ref 101–111)
CO2: 34 mmol/L — AB (ref 22–32)
Calcium: 8.5 mg/dL — ABNORMAL LOW (ref 8.9–10.3)
Creatinine, Ser: 0.81 mg/dL (ref 0.44–1.00)
GFR calc Af Amer: >60 mL/min (ref >60)
GLUCOSE: 140 mg/dL — AB (ref 65–99)
POTASSIUM: 3.8 mmol/L (ref 3.5–5.1)
Sodium: 147 mmol/L — ABNORMAL HIGH (ref 135–145)

## 2015-06-22 LAB — GLUCOSE, CAPILLARY
Glucose-Capillary: 108 mg/dL — ABNORMAL HIGH (ref 65–99)
Glucose-Capillary: 121 mg/dL — ABNORMAL HIGH (ref 65–99)
Glucose-Capillary: 137 mg/dL — ABNORMAL HIGH (ref 65–99)
Glucose-Capillary: 148 mg/dL — ABNORMAL HIGH (ref 65–99)
Glucose-Capillary: 154 mg/dL — ABNORMAL HIGH (ref 65–99)
Glucose-Capillary: 159 mg/dL — ABNORMAL HIGH (ref 65–99)

## 2015-06-22 LAB — MAGNESIUM: MAGNESIUM: 1.8 mg/dL (ref 1.7–2.4)

## 2015-06-22 MED ORDER — MIDAZOLAM HCL 2 MG/2ML IJ SOLN
INTRAMUSCULAR | Status: AC
Start: 2015-06-22 — End: 2015-06-22
  Filled 2015-06-22: qty 2

## 2015-06-22 MED ORDER — FENTANYL CITRATE (PF) 100 MCG/2ML IJ SOLN
50.0000 ug | Freq: Once | INTRAMUSCULAR | Status: AC
  Administered 2015-06-22: 50 ug via INTRAVENOUS

## 2015-06-22 MED ORDER — FLEET ENEMA 7-19 GM/118ML RE ENEM: 1.0000 | ENEMA | Freq: Once | RECTAL | Status: DC

## 2015-06-22 MED ORDER — MIDAZOLAM HCL 2 MG/2ML IJ SOLN
2.0000 mg | Freq: Once | INTRAMUSCULAR | Status: AC
  Administered 2015-06-22: 2 mg via INTRAVENOUS

## 2015-06-22 NOTE — Progress Notes (Signed)
RASS -1,. On vent. Poorly tolerant of weaning efforts. Episodic PSVT. Episodic desaturations. Intermittent agitation. FiO2 decreased to 28%, no significant agitation lastnight, awake and more alert this morning, but still with extremity flaccidity, CT head with no lesion, but b/l mastoid effusions.  Thick purulent secretions noted. S/P Bronch with BAL on 11/27  Filed Vitals:   06/22/15 0600 06/22/15 0700 06/22/15 0800 06/22/15 0807  BP: 114/67 153/86 154/85   Pulse: 110 106 116   Temp:  100 F (37.8 C)    TempSrc:  Oral    Resp: '27 28 27   '$ Height:      Weight:      SpO2: 93% 95% 94% 94%   HEENT WNL Trach site with thick purulent secretions Scattered rhonchi, prolonged exp phase Reg, no M Obese, soft, diminished BS Ext warm, symmetric edema Neuro - mild right sided weakness, more awake and alert, following simple commands, still with overall flaccid extermities   CMP Latest Ref Rng 06/22/2015 06/21/2015 06/20/2015  Glucose 65 - 99 mg/dL 140(H) 128(H) 119(H)  BUN 6 - 20 mg/dL 29(H) 31(H) 32(H)  Creatinine 0.44 - 1.00 mg/dL 0.81 0.80 0.80  Sodium 135 - 145 mmol/L 147(H) 146(H) 146(H)  Potassium 3.5 - 5.1 mmol/L 3.8 3.2(L) 3.5  Chloride 101 - 111 mmol/L 106 103 100(L)  CO2 22 - 32 mmol/L 34(H) 35(H) 38(H)  Calcium 8.9 - 10.3 mg/dL 8.5(L) 8.5(L) 8.4(L)  Total Protein 6.5 - 8.1 g/dL - - -  Total Bilirubin 0.3 - 1.2 mg/dL - - -  Alkaline Phos 38 - 126 U/L - - -  AST 15 - 41 U/L - - -  ALT 14 - 54 U/L - - -    CBC    Component Value Date/Time   WBC 9.6 06/21/2015 0610   RBC 2.71* 06/21/2015 0610   RBC 2.71* 06/21/2015 0610   HGB 8.0* 06/21/2015 0610   HCT 24.6* 06/21/2015 0610   PLT 256 06/21/2015 0610   MCV 90.7 06/21/2015 0610   MCH 29.4 06/21/2015 0610   MCHC 32.4 06/21/2015 0610   RDW 18.4* 06/21/2015 0610   LYMPHSABS 0.9* 06/21/2015 0610   MONOABS 0.6 06/21/2015 0610   EOSABS 0.0 06/21/2015 0610   BASOSABS 0.1 06/21/2015 0610    CXR: [place] BLL opacities  CT head  11/25>> no IC lesion, bilateral Mastoid effusion  Significant Events 11/21-11/27 11/22>>bronchoscopy, thick secretions cleared 11/24>>bronchoscopy for RLL, RML collapse>>large mucus plugs removed.  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 Make sure to renew if indicated Cont scheduled enteral metoprolol PRN IV metoprolol to maintain HR < 115/min Monitor BMET intermittently, check CE Monitor I/Os Correct electrolytes as indicated DVT px: SCDs Monitor CBC intermittently Transfuse per usual guidelines Patient with depressed mentation - all sedation and agitation meds reduce by 50%, fentanyl patch removed - now more alert and awake Augmentin for sinusitis/tracheitis  S/P bronch with BAL on 11/27 for thick purulent secretions.  I have personally obtained a history, examined the patient, evaluated laboratory and imaging results, formulated the assessment and plan and placed orders. CRITICAL CARE: The patient is critically ill with multiple organ systems failure and requires high complexity decision making for assessment and support, frequent evaluation and titration of therapies, application of advanced monitoring technologies and extensive interpretation of multiple databases.   Critical Care Time devoted to patient care services described in  this note is 45 minutes.    Vilinda Boehringer, MD [facility] Pager [phone] (please enter 7-digits) On Call Pager - [phone] (please enter 7-digits)

## 2015-06-22 NOTE — Progress Notes (Signed)
Per Dr. Stevenson Clinch after abd x-ray: ok to restart tube feeds. Emily Pratt E 10:02 AM 06/22/2015

## 2015-06-22 NOTE — Procedures (Signed)
Date: 06/22/2015,   PHYSICIAN:  Imanol Bihl  Indications/Preliminary Diagnosis: Right Lung mass, thick purulent secretions  Consent: (Place X beside choice/s below)  The benefits, risks and possible complications of the procedure were        explained to:  ___ patient  x___ patient's family  ___ other:___________  who verbalized understanding and gave:  ___ verbal  ___ written  ___ verbal and written  _x__ telephone  ___ other:________ consent.      Unable to obtain consent; procedure performed on emergent basis.     Other:       PRESEDATION ASSESSMENT: History and Physical has been performed. Patient meds and allergies have been reviewed. Presedation airway examination has been performed and documented. Baseline vital signs, sedation score, oxygenation status, and cardiac rhythm were reviewed. Patient was deemed to be in satisfactory condition to undergo the procedure.  PREMEDICATIONS:   Sedative/Narcotic Amt Dose   Versed 1 mg   Fentanyl 50 mcg  Diprivan  mg        Airway Prep (Place X beside choice below)   1% Transtracheal Lidocaine Anesthetization 7 cc   Patient prepped per Bronchoscopy Lab Policy       Insertion Route (Place X beside choice below)   Nasal   Oral   Endotracheal Tube  x Tracheostomy   INTRAPROCEDURE MEDICATIONS:  Sedative/Narcotic Amt Dose   Versed  mg   Fentanyl  mcg  Diprivan  mg       Medication Amt Dose  Medication Amt Dose  Xylocaine 2%  cc  Epinephrine 1:10,000 sol  cc  Xylocaine 4%  cc  Cocaine  cc   TECHNICAL PROCEDURES: (Place X beside choice below)   Procedures  Description    None     Electrocautery     Cryotherapy     Balloon Dilatation     Bronchography     Stent Placement   x  Therapeutic Aspiration Right Lung    Laser/Argon Plasma    Brachytherapy Catheter Placement    Foreign Body Removal     SPECIMENS (Sites): (Place X beside choice below)  Specimens Description   No Specimens Obtained     Washings   x  Lavage BAL - RML   Biopsies    Fine Needle Aspirates    Brushings    Sputum    FINDINGS: see below  ESTIMATED BLOOD LOSS:   COMPLICATIONS/RESOLUTION:   PROCEDURE DETAILS: Timeout performed and correct patient, name, & ID confirmed. Following prep per Pulmonary policy, appropriate sedation was administered.  Airway exam proceeded with findings, technical procedures, and specimen collection as noted below. At the end of exam the scope was withdrawn without incident. Impression and Plan as noted below.   Inspection: Right Lung - moderate thick mucus secretions, no mucus plugging, erythema of the right upper lobe airways, BAL of right middle lobe. No significant endobronchial lesions  Left Lung - thin scanty secretions noted, no endobronchial lesions, normal-appearing mucosa.  Procedure: BAL of right middle lobe  IMPLANTED DEVICE(S): None  IMPRESSION:POST-PROCEDURE DX: Lung mass  RECOMMENDATION/PLAN: Follow up on cultures    ADDITIONAL COMMENTS: None   Procedure Time: Approximately 10 minutes   Vilinda Boehringer, MD [facility] Pager : [phone] (Please enter 7 digits)

## 2015-06-22 NOTE — Progress Notes (Signed)
[facility] Hematology/Oncology Progress Note  Date of admission: 05/12/2015  Hospital day:  06/22/2015  Chief Complaint: Emily Pratt is a 69 y.o. female who was admitted with acute on chronic respiratory failure in association with a large right lung mass.  Subjective: Patient remains intubated.  Patient does not consistently respond.  Left hand grip (slight) and wiggles right toes..  Social History: The patient is alone today.  Allergies:  Allergies  Allergen Reactions  . Atorvastatin Other (See Comments)    Does not remember why she had Intolerance to Lipitor.  . Rosuvastatin Anxiety    Chest tigtness and feeling as if she had heartburn.    Scheduled Medications: . amoxicillin-clavulanate  500 mg Oral Q12H  . antiseptic oral rinse  7 mL Mouth Rinse 10 times per day  . budesonide (PULMICORT) nebulizer solution  0.5 mg Nebulization BID  . chlorhexidine gluconate  15 mL Mouth Rinse BID  . clonazePAM  0.5 mg Per Tube BID  . famotidine  20 mg Per Tube BID  . feeding supplement (PRO-STAT SUGAR FREE 64)  30 mL Per Tube Daily  . free water  200 mL Per Tube 3 times per day  . insulin aspart  0-20 Units Subcutaneous 6 times per day  . insulin glargine  20 Units Subcutaneous Daily  . levalbuterol  0.63 mg Nebulization Q6H  . metoprolol tartrate  100 mg Per Tube BID  . midazolam      . polyethylene glycol  17 g Oral Daily  . senna-docusate  1 tablet Per Tube BID  . sodium phosphate  1 enema Rectal Once    Review of Systems:  Unable to obtain as patient intubated.  Physical Exam: Blood pressure 128/63, pulse 120, temperature 99.2 F (37.3 C), temperature source Oral, resp. rate 29, height _0  (1.702 m), weight 226 lb 13.7 oz (102.9 kg), SpO2 94 %.  GENERAL:  Chronically ill appearing woman lying in bed in the ICU intubated in no acute distress. MENTAL STATUS:  Does not follow commands. HEAD:  Normocephalic, atraumatic, face symmetric, no Cushingoid  features. EYES:   Closes eyes.  Pupils equal round and reactive to light and accomodation.  No conjunctivitis or scleral icterus. ENT:  Tracheostomy.  Intubated.  RESPIRATORY:  Scattered rhonchi. No wheezing. CARDIOVASCULAR:  Regular rate and rhythm without murmur, rub or gallop. ABDOMEN:  Soft, non-tender, with active bowel sounds, and no appreciable hepatosplenomegaly.  No masses. SKIN:  No rashes, ulcers or lesions. EXTREMITIES:  No edema.  No skin discoloration or tenderness.  No palpable cords. LYMPH NODES: No palpable cervical, supraclavicular, axillary or inguinal adenopathy  NEUROLOGICAL: Does not follow commands. Grips left hand and wiggles right toes. PSYCH:  Unable to assess.  Results for orders placed or performed during the hospital encounter of 05/14/2015 (from the past 48 hour(s))  Glucose, capillary     Status: Abnormal   Collection Time: 06/20/15  4:55 PM  Result Value Ref [place]   Glucose-Capillary 140 (H) 65 - 99 mg/dL  Troponin I     Status: Abnormal   Collection Time: 06/20/15  5:25 PM  Result Value Ref Range   Troponin I 0.04 (H) <0.031 ng/mL    Comment: RESULTS PREVIOUSLY CALLED TO [facility]ER  @ 1045 06/20/15 BY SRC/TCH        PERSISTENTLY INCREASED TROPONIN VALUES IN THE RANGE OF 0.04-0.49 ng/mL CAN BE SEEN IN:       -UNSTABLE ANGINA       -CONGESTIVE  HEART FAILURE       -MYOCARDITIS       -CHEST TRAUMA       -ARRYHTHMIAS       -LATE PRESENTING MYOCARDIAL INFARCTION       -COPD   CLINICAL FOLLOW-UP RECOMMENDED.   CK     Status: Abnormal   Collection Time: 06/20/15  5:25 PM  Result Value Ref [place]   Total CK 9 (L) 38 - 234 U/L  Glucose, capillary     Status: Abnormal   Collection Time: 06/20/15  8:49 PM  Result Value Ref Range   Glucose-Capillary 106 (H) 65 - 99 mg/dL  Troponin I     Status: Abnormal   Collection Time: 06/20/15 11:00 PM  Result Value Ref Range   Troponin I 0.04 (H) <0.031 ng/mL    Comment: RESULTS PREVIOUSLY CALLED AT 1045  06/20/15.PMH        PERSISTENTLY INCREASED TROPONIN VALUES IN THE RANGE OF 0.04-0.49 ng/mL CAN BE SEEN IN:       -UNSTABLE ANGINA       -CONGESTIVE HEART FAILURE       -MYOCARDITIS       -CHEST TRAUMA       -ARRYHTHMIAS       -LATE PRESENTING MYOCARDIAL INFARCTION       -COPD   CLINICAL FOLLOW-UP RECOMMENDED.   CK     Status: Abnormal   Collection Time: 06/20/15 11:00 PM  Result Value Ref [place]   Total CK 10 (L) 38 - 234 U/L  Glucose, capillary     Status: Abnormal   Collection Time: 06/20/15 11:28 PM  Result Value Ref [place]   Glucose-Capillary 106 (H) 65 - 99 mg/dL  Glucose, capillary     Status: Abnormal   Collection Time: 06/21/15  5:02 AM  Result Value Ref [place]   Glucose-Capillary 122 (H) 65 - 99 mg/dL  Basic metabolic panel     Status: Abnormal   Collection Time: 06/21/15  6:10 AM  Result Value Ref [place]   Sodium 146 (H) 135 - 145 mmol/L   Potassium 3.2 (L) 3.5 - 5.1 mmol/L   Chloride 103 101 - 111 mmol/L   CO2 35 (H) 22 - 32 mmol/L   Glucose, Bld 128 (H) 65 - 99 mg/dL   BUN 31 (H) 6 - 20 mg/dL   Creatinine, Ser 0.80 0.44 - 1.00 mg/dL   Calcium 8.5 (L) 8.9 - 10.3 mg/dL   GFR calc non Af Amer >60 >60 mL/min   GFR calc Af Amer >60 >60 mL/min    Comment: (NOTE) The eGFR has been calculated using the CKD EPI equation. This calculation has not been validated in all clinical situations. eGFR's persistently <60 mL/min signify possible Chronic Kidney Disease.    Anion gap 8 5 - 15  CBC with Differential     Status: Abnormal   Collection Time: 06/21/15  6:10 AM  Result Value Ref [place]   WBC 9.6 3.6 - 11.0 K/uL   RBC 2.71 (L) 3.80 - 5.20 MIL/uL   Hemoglobin 8.0 (L) 12.0 - 16.0 g/dL   HCT 24.6 (L) 35.0 - 47.0 %   MCV 90.7 80.0 - 100.0 fL   MCH 29.4 26.0 - 34.0 pg   MCHC 32.4 32.0 - 36.0 g/dL   RDW 18.4 (H) 11.5 - 14.5 %   Platelets 256 150 - 440 K/uL   Neutrophils Relative % 83 %   Neutro Abs 8.0 (H) 1.4 - 6.5 K/uL   Lymphocytes Relative  9 %   Lymphs Abs 0.9  (L) 1.0 - 3.6 K/uL   Monocytes Relative 7 %   Monocytes Absolute 0.6 0.2 - 0.9 K/uL   Eosinophils Relative 0 %   Eosinophils Absolute 0.0 0 - 0.7 K/uL   Basophils Relative 1 %   Basophils Absolute 0.1 0 - 0.1 K/uL  Reticulocytes     Status: Abnormal   Collection Time: 06/21/15  6:10 AM  Result Value Ref [place]   Retic Ct Pct 6.2 (H) 0.4 - 3.1 %   RBC. 2.71 (L) 3.80 - 5.20 MIL/uL   Retic Count, Manual 168.0 19.0 - 183.0 K/uL  Ferritin     Status: Abnormal   Collection Time: 06/21/15  6:10 AM  Result Value Ref [place]   Ferritin 335 (H) 11 - 307 ng/mL  Iron and TIBC     Status: Abnormal   Collection Time: 06/21/15  6:10 AM  Result Value Ref [place]   Iron 24 (L) 28 - 170 ug/dL   TIBC 203 (L) 250 - 450 ug/dL   Saturation Ratios 12 10.4 - 31.8 %   UIBC 179 ug/dL  Folate     Status: None   Collection Time: 06/21/15  6:10 AM  Result Value Ref [place]   Folate 10.6 >5.9 ng/mL  TSH     Status: None   Collection Time: 06/21/15  6:10 AM  Result Value Ref [place]   TSH 1.374 0.350 - 4.500 uIU/mL  Glucose, capillary     Status: Abnormal   Collection Time: 06/21/15  7:08 AM  Result Value Ref [place]   Glucose-Capillary 112 (H) 65 - 99 mg/dL  Glucose, capillary     Status: Abnormal   Collection Time: 06/21/15 11:19 AM  Result Value Ref [place]   Glucose-Capillary 140 (H) 65 - 99 mg/dL  Glucose, capillary     Status: Abnormal   Collection Time: 06/21/15  3:58 PM  Result Value Ref [place]   Glucose-Capillary 100 (H) 65 - 99 mg/dL  Glucose, capillary     Status: Abnormal   Collection Time: 06/21/15  7:42 PM  Result Value Ref [place]   Glucose-Capillary 111 (H) 65 - 99 mg/dL  Glucose, capillary     Status: Abnormal   Collection Time: 06/21/15 11:53 PM  Result Value Ref [place]   Glucose-Capillary 124 (H) 65 - 99 mg/dL  Glucose, capillary     Status: Abnormal   Collection Time: 06/22/15  3:31 AM  Result Value Ref [place]   Glucose-Capillary 108 (H) 65 - 99 mg/dL  Magnesium     Status: None    Collection Time: 06/22/15  5:42 AM  Result Value Ref [place]   Magnesium 1.8 1.7 - 2.4 mg/dL  Basic metabolic panel     Status: Abnormal   Collection Time: 06/22/15  5:42 AM  Result Value Ref [place]   Sodium 147 (H) 135 - 145 mmol/L   Potassium 3.8 3.5 - 5.1 mmol/L   Chloride 106 101 - 111 mmol/L   CO2 34 (H) 22 - 32 mmol/L   Glucose, Bld 140 (H) 65 - 99 mg/dL   BUN 29 (H) 6 - 20 mg/dL   Creatinine, Ser 0.81 0.44 - 1.00 mg/dL   Calcium 8.5 (L) 8.9 - 10.3 mg/dL   GFR calc non Af Amer >60 >60 mL/min   GFR calc Af Amer >60 >60 mL/min    Comment: (NOTE) The eGFR has been calculated using the CKD EPI equation. This calculation has not been validated in all  clinical situations. eGFR's persistently <60 mL/min signify possible Chronic Kidney Disease.    Anion gap 7 5 - 15  Glucose, capillary     Status: Abnormal   Collection Time: 06/22/15  7:22 AM  Result Value Ref [place]   Glucose-Capillary 159 (H) 65 - 99 mg/dL  Glucose, capillary     Status: Abnormal   Collection Time: 06/22/15 11:16 AM  Result Value Ref [place]   Glucose-Capillary 137 (H) 65 - 99 mg/dL   Ct Head Wo Contrast  06/20/2015  CLINICAL DATA:  Lung cancer.  Diabetes.  CHF.  Oxygen desaturation. EXAM: CT HEAD WITHOUT CONTRAST TECHNIQUE: Contiguous axial images were obtained from the base of the skull through the vertex without intravenous contrast. COMPARISON:  None FINDINGS: Sinuses/Soft tissues: Intubation. Bilateral mastoid effusions. Paranasal sinuses clear. Intracranial: Moderate motion degradation throughout. Moderate low density in the periventricular white matter likely related to small vessel disease. Cerebral atrophy which is age advanced. Carotid and vertebral artery atherosclerosis. Given motion limitation, no mass lesion, hemorrhage, hydrocephalus, acute infarct, intra-axial, or extra-axial fluid collection. IMPRESSION: 1. Moderate motion degraded exam. 2.  Cerebral atrophy and small vessel ischemic change. 3. No  definite acute intracranial abnormality. Low sensitivity for intracranial metastasis secondary to noncontrast technique and motion. 4. Bilateral mastoid effusions. Electronically Signed   By: Abigail Miyamoto M.D.   On: 06/20/2015 15:33   Dg Abd Portable 1v  06/22/2015  CLINICAL DATA:  NG tube residuals.  Check placement EXAM: PORTABLE ABDOMEN - 1 VIEW COMPARISON:  Radiograph 06/15/2015 FINDINGS: NG tube extends into the stomach. The side port is below the GE junction. Tip is below the margin of film. Second film was provided which demonstrates tip within the gastric body. Bilateral basilar effusions and atelectasis. IMPRESSION: NG tube appears in proper position. Electronically Signed   By: Suzy Bouchard M.D.   On: 06/22/2015 09:29    Assessment:  Emily Pratt is a 69 y.o. 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  Chest CT on 06/06/2015  revealed a response to therapy.  She has been extubated and reintubated.  Tracheostomy performed on 06/02/2015.  Counts on 06/21/2015 adequate.  Anemia slowly improving.  Retic high.  Iron studies consistent with chronic disease.  Folate and TSH normal.  Neurologically, minimally responsive.  Plan: 1. Hematology/Oncology:  Anticipate family meeting with Dr Grayland Ormond this week.  Cycle #2 cisplatin and etoposide on 06/23/2015.   2. Pulmonary:  Remains intubated.  BAL of right middle lobe performed today.  Appreciate pulmonary/critical care management.  Weaning vent as tolerated. 3. Neurology:  Poorly responsive.  Head CT negative.  Check paraneoplastic panel ordered.  Check ammonia level. 4. Disposition:  Anticipate long term plans/discussion with family and Dr. Grayland Ormond next week regarding goals.   Lequita Asal, MD  06/22/2015, 12:27 PM

## 2015-06-22 NOTE — Consult Note (Signed)
Electrolyte CONSULT NOTE - FOLLOW UP  Pharmacy Consult for Electrolyte monitoring and replacement  Patient Measurements: Height: '5\' 7"'$  (170.2 cm) Weight: 226 lb 13.7 oz (102.9 kg) IBW/kg (Calculated) : 61.6  Vital Signs: Temp: 100 F (37.8 C) (11/27 0700) Temp Source: Oral (11/27 0700) BP: 154/85 mmHg (11/27 0800) Pulse Rate: 116 (11/27 0800)    Recent Labs  06/20/15 0535 06/21/15 0610 06/22/15 0542  NA 146* 146* 147*  K 3.5 3.2* 3.8  CL 100* 103 106  CO2 38* 35* 34*  GLUCOSE 119* 128* 140*  BUN 32* 31* 29*  CREATININE 0.80 0.80 0.81  CALCIUM 8.4* 8.5* 8.5*  MG 1.8  --  1.8  PHOS 4.6  --   --    Estimated Creatinine Clearance: 83.1 mL/min (by C-G formula based on Cr of 0.81).    Recent Labs  06/21/15 2353 06/22/15 0331 06/22/15 0722  GLUCAP 124* 108* 159*     Assessment: 69 yo female with SCLC current;y intubated in ICU. Pharmacy consulted for monitoring and managing electrolytes.   1124 0300 potassium 4, phosphorus 4.8, magnesium 1.9 1125 0535 potassium 3.5, phosphorus 4.6, magnesium 1.8 1126 0610 potassium 3.2  11/27 Electrolytes WNL.   Plan:  No supplementation is warranted at this time.   BMP ordered for 0500 tomorrow AM with Mg and Phos level.  Pharmacy will continue to monitor labs and replace as needed.  Olivia Canter, [facility]  Clinical Pharmacist 06/22/2015,9:27 AM

## 2015-06-22 NOTE — Consult Note (Addendum)
WOC wound consult note Reason for Consult:Stage 3 pressure injury, left lower buttocks/ischial tuberosity. Seen on 06/13/2015 by my partner, K. Sanders and area at that time was macerated.  Today, there is a small crater presentation. Bilateral heels intact, floated.  Will add Prevalon Boots to float and correct lateral rotation. Wound type:Pressure/shear Pressure Ulcer POA: No Measurement:  3cm x 2cm x 0.4cm Wound bed:red, moist Drainage (amount, consistency, odor) serous, small amount Periwound:intact, blanching erythema Dressing procedure/placement/frequency: I will provide orders for pressure redistribution boots for bilateral heels.  While intact, patient is rotating laterally and these will not only float, but correct for non-neutral alignment.  The left ischial tuberosity today presents with a Stage 3 pressure injury that will benefit from filling with a Ca+ alginate (Algisite Elson Clan #625) dressing topped with a dry dressing prior to covering with a soft silicone foam for the sacrum. Patient will require continued turning and repositioning from side to side since [facility] must be between a 30 and 40 degree angle for respiratory purposes. Additionally, the patient is experiencing periodic liquid to pasty episodes of stool, which can contaminate the treatment area. [facility] nursing team will follow and will remain available to this patient, the nursing and medical teams.  Thanks, Maudie Flakes, MSN, RN, [place], Arther Abbott  Pager# [phone]

## 2015-06-22 NOTE — Progress Notes (Signed)
Pt gastric residual checked: 367m's. Gastric residual returned. Dr. MStevenson Clinchnotified of amount of residuals. Acknowledged and new orders received. Elsbeth Yearick E 8:05 AM 06/22/2015

## 2015-06-23 DIAGNOSIS — D649 Anemia, unspecified: Secondary | ICD-10-CM

## 2015-06-23 LAB — BASIC METABOLIC PANEL
Anion gap: 6 (ref 5–15)
BUN: 33 mg/dL — AB (ref 6–20)
CALCIUM: 8.5 mg/dL — AB (ref 8.9–10.3)
CO2: 32 mmol/L (ref 22–32)
CREATININE: 0.76 mg/dL (ref 0.44–1.00)
Chloride: 110 mmol/L (ref 101–111)
Glucose, Bld: 147 mg/dL — ABNORMAL HIGH (ref 65–99)
Potassium: 3.6 mmol/L (ref 3.5–5.1)
SODIUM: 148 mmol/L — AB (ref 135–145)

## 2015-06-23 LAB — CULTURE, BLOOD (ROUTINE X 2)
CULTURE: NO GROWTH
CULTURE: NO GROWTH

## 2015-06-23 LAB — GLUCOSE, CAPILLARY
Glucose-Capillary: 117 mg/dL — ABNORMAL HIGH (ref 65–99)
Glucose-Capillary: 124 mg/dL — ABNORMAL HIGH (ref 65–99)
Glucose-Capillary: 129 mg/dL — ABNORMAL HIGH (ref 65–99)
Glucose-Capillary: 138 mg/dL — ABNORMAL HIGH (ref 65–99)
Glucose-Capillary: 145 mg/dL — ABNORMAL HIGH (ref 65–99)
Glucose-Capillary: 211 mg/dL — ABNORMAL HIGH (ref 65–99)

## 2015-06-23 LAB — MAGNESIUM: MAGNESIUM: 1.7 mg/dL (ref 1.7–2.4)

## 2015-06-23 LAB — VITAMIN B12: Vitamin B-12: 3344 pg/mL — ABNORMAL HIGH (ref 180–914)

## 2015-06-23 LAB — PHOSPHORUS: PHOSPHORUS: 4.1 mg/dL (ref 2.5–4.6)

## 2015-06-23 LAB — AMMONIA: Ammonia: 19 umol/L (ref 9–35)

## 2015-06-23 MED ORDER — PRO-STAT SUGAR FREE PO LIQD
30.0000 mL | Freq: Three times a day (TID) | ORAL | Status: DC
  Administered 2015-06-23 – 2015-06-26 (×8): 30 mL

## 2015-06-23 MED ORDER — LOSARTAN POTASSIUM 50 MG PO TABS
50.0000 mg | ORAL_TABLET | Freq: Every day | ORAL | Status: DC
  Administered 2015-06-23 – 2015-07-13 (×18): 50 mg via ORAL
  Filled 2015-06-23 (×19): qty 1

## 2015-06-23 MED ORDER — MAGNESIUM SULFATE IN D5W 10-5 MG/ML-% IV SOLN
1.0000 g | Freq: Once | INTRAVENOUS | Status: AC
  Administered 2015-06-23: 1 g via INTRAVENOUS
  Filled 2015-06-23: qty 100

## 2015-06-23 MED ORDER — VITAL HIGH PROTEIN PO LIQD
1000.0000 mL | ORAL | Status: DC
Start: 2015-06-23 — End: 2015-06-26
  Administered 2015-06-24 – 2015-06-25 (×2): 1000 mL

## 2015-06-23 NOTE — Progress Notes (Signed)
RASS -1,. On vent. Poorly tolerant of weaning efforts. Episodic PSVT. Episodic desaturations. Intermittent agitation. FiO2 decreased to 28%, no significant agitation lastnight, awake and more alert this morning, but still with extremity flaccidity, CT head with no lesion, but b/l mastoid effusions.  Thick purulent secretions noted. S/P Bronch with BAL on 11/27; pending.   Filed Vitals:   06/23/15 0816 06/23/15 0900 06/23/15 1000 06/23/15 1100  BP:  153/92 156/96 144/81  Pulse:  125 119 104  Temp:      TempSrc:      Resp:  '26 22 23  '$ Height:      Weight:      SpO2: 94% 95% 95% 97%   HEENT WNL Trach site with thick purulent secretions Scattered rhonchi, prolonged exp phase Reg, no M Obese, soft, diminished BS Ext warm, symmetric edema Neuro - mild right sided weakness, more awake and alert, following simple commands, still with overall flaccid extermities   CMP Latest Ref Rng 06/22/2015 06/22/2015 06/21/2015  Glucose 65 - 99 mg/dL 140(H) 147(H) 128(H)  BUN 6 - 20 mg/dL 29(H) 33(H) 31(H)  Creatinine 0.44 - 1.00 mg/dL 0.81 0.76 0.80  Sodium 135 - 145 mmol/L 147(H) 148(H) 146(H)  Potassium 3.5 - 5.1 mmol/L 3.8 3.6 3.2(L)  Chloride 101 - 111 mmol/L 106 110 103  CO2 22 - 32 mmol/L 34(H) 32 35(H)  Calcium 8.9 - 10.3 mg/dL 8.5(L) 8.5(L) 8.5(L)  Total Protein 6.5 - 8.1 g/dL - - -  Total Bilirubin 0.3 - 1.2 mg/dL - - -  Alkaline Phos 38 - 126 U/L - - -  AST 15 - 41 U/L - - -  ALT 14 - 54 U/L - - -    CBC    Component Value Date/Time   WBC 9.6 06/21/2015 0610   RBC 2.71* 06/21/2015 0610   RBC 2.71* 06/21/2015 0610   HGB 8.0* 06/21/2015 0610   HCT 24.6* 06/21/2015 0610   PLT 256 06/21/2015 0610   MCV 90.7 06/21/2015 0610   MCH 29.4 06/21/2015 0610   MCHC 32.4 06/21/2015 0610   RDW 18.4* 06/21/2015 0610   LYMPHSABS 0.9* 06/21/2015 0610   MONOABS 0.6 06/21/2015 0610   EOSABS 0.0 06/21/2015 0610   BASOSABS 0.1 06/21/2015 0610    CXR: [place] BLL opacities  CT head 11/25>> no IC  lesion, bilateral Mastoid effusion  Significant Events 11/21-11/27 11/22>>bronchoscopy, thick secretions cleared 11/24>>bronchoscopy for RLL, RML collapse>>large mucus plugs removed.  11/27>>bronchoscopy with BAL RML, thick secretions, no mucus plugs  Abx Augmentin 11/25>>for 5 day course.    IMPRESSION: Prolonged VDRF 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, chemotherapy  Significant improvement in airway patency by bronchoscopy 11/22 COPD  Mucus plugging PSVT  Anemia - improving Thrombocytopenia - improving Agitated delirium Volume overload Large TF residuals  - resolved Bilateral Mastoid effusions/sinusitis Tracheitis ?Paraneoplastic Syndrome -  [name]  PLAN/REC: Cont vent support - settings reviewed and/or adjusted Wean in PSV as tolerated today, back on vent overnight.  Cont vent bundle Cont nebulized steroids Cont nebulized bronchodilators Add nebulized NAC 11/23 - stop date in 3 days. Make sure to renew if indicated Cont scheduled enteral metoprolol PRN IV metoprolol to maintain HR < 115/min Monitor BMET intermittently, check CE Monitor I/Os Correct electrolytes as indicated DVT px: SCDs Monitor CBC intermittently Transfuse per usual guidelines Patient with depressed mentation - all sedation and agitation meds reduce by 50%, fentanyl patch removed - now more alert and awake Augmentin for sinusitis/tracheitis  S/P  bronch with BAL on 11/27 for thick purulent secretions.   Marda Stalker, M.D.   Critical Care Attestation.  I have personally obtained a history, examined the patient, evaluated laboratory and imaging results, formulated the assessment and plan and placed orders. Case discussed with critical care RN, unit manager, respiratory therapy, nutrition, ICU pharmacist. The Patient requires high complexity decision making for assessment and support, frequent evaluation and titration of therapies, application of advanced monitoring technologies and  extensive interpretation of multiple databases. The patient has critical illness that could lead imminently to failure of 1 or more organ systems and requires the highest level of physician preparedness to intervene.  Critical Care Time devoted to patient care services described in this note is 35 minutes and is exclusive of time spent in procedures.

## 2015-06-23 NOTE — Progress Notes (Signed)
   06/23/15 1300  Clinical Encounter Type  Visited With Patient;Health care provider  Visit Type Follow-up  Consult/Referral To Chaplain  Spiritual Encounters  Spiritual Needs Emotional  Stress Factors  Patient Stress Factors Health changes  Chaplain rounded in the unit and visited with the patient. Offered care and a compassionate presence. Patient seemed to appreciate visit and time spent together. Chaplain Cambria Osten A. Darely Becknell Ext. [phone]

## 2015-06-23 NOTE — Consult Note (Signed)
Electrolyte CONSULT NOTE - FOLLOW UP  Pharmacy Consult for Electrolyte monitoring and replacement  Patient Measurements: Height: '5\' 7"'$  (170.2 cm) Weight: 226 lb 13.7 oz (102.9 kg) IBW/kg (Calculated) : 61.6  Vital Signs: Temp: 99.3 F (37.4 C) (11/28 0735) Temp Source: Oral (11/28 0735) BP: 166/110 mmHg (11/28 0800) Pulse Rate: 111 (11/28 0800)    Recent Labs  06/21/15 0610 06/22/15 0530 06/22/15 0542  NA 146* 148* 147*  K 3.2* 3.6 3.8  CL 103 110 106  CO2 35* 32 34*  GLUCOSE 128* 147* 140*  BUN 31* 33* 29*  CREATININE 0.80 0.76 0.81  CALCIUM 8.5* 8.5* 8.5*  MG  --  1.7 1.8  PHOS  --  4.1  --    Estimated Creatinine Clearance: 83.1 mL/min (by C-G formula based on Cr of 0.81).    Recent Labs  06/22/15 2340 06/23/15 0445 06/23/15 0714  GLUCAP 121* 138* 117*     Assessment: 69 yo female with SCLC current;y intubated in ICU. Pharmacy consulted for monitoring and managing electrolytes.   Labs reported at [place] 11/27 are actually labs from today per lab.    Plan:  Electrolytes are wnl except magnesium is at the low end of goal range so will replace with magnesium sulfate 1 g iv once and f/u am labs.   Napoleon Form, [facility]  Clinical Pharmacist 06/23/2015,10:41 AM

## 2015-06-23 NOTE — Progress Notes (Signed)
[place]  [phone] [phone]  ID: Emily Pratt OB: 08/19/1945  MR#: [id number]  AJO#:[id number]  Patient Care Team: Dagoberto Ligas, MD as PCP - General (Internal Medicine)  CHIEF COMPLAINT:  Chief Complaint  Patient presents with  . Respiratory Distress    INTERVAL HISTORY: Patient now has tracheostomy, but remains on a ventilator. She is alert and answering questions appropriately today. No family is at bedside. She has indicated that she wishes to proceed with cycle 2 of treatment later this week.   REVIEW OF SYSTEMS:   Review of Systems  Unable to perform ROS: intubated    PAST MEDICAL HISTORY: Past Medical History  Diagnosis Date  . Anxiety   . Arthritis   . COPD (chronic obstructive pulmonary disease) ([place])   . CHF (congestive heart failure) ([place])   . Hyperlipidemia   . Hypertension   . Diabetes mellitus without complication ([place])   . Chronic kidney disease   . Neuromuscular disorder ([place])     PAST SURGICAL HISTORY: Past Surgical History  Procedure Laterality Date  . Joint replacement Bilateral 2006    both knees  . Ankle surgery    . Tracheostomy tube placement N/A 06/05/2015    Procedure: TRACHEOSTOMY;  Surgeon: Margaretha Sheffield, MD;  Location: [facility] ORS;  Service: ENT;  Laterality: N/A;    FAMILY HISTORY Family History  Problem Relation Age of Onset  . Diabetes Mother   . Cancer Father   . Diabetes Sister   . Stroke Sister        ADVANCED DIRECTIVES:    HEALTH MAINTENANCE: Social History  Substance Use Topics  . Smoking status: Current Every Day Smoker    Types: Cigarettes  . Smokeless tobacco: None     Comment: 0.5 pack /day  . Alcohol Use: No     Colonoscopy:  PAP:  Bone density:  Lipid panel:  Allergies  Allergen Reactions  . Atorvastatin Other (See Comments)    Does not remember why she had Intolerance to Lipitor.  . Rosuvastatin Anxiety    Chest tigtness and feeling as if she had  heartburn.    Current Facility-Administered Medications  Medication Dose Route Frequency Provider Last Rate Last Dose  . acetaminophen (TYLENOL) tablet 650 mg  650 mg Oral Q6H PRN Harrie Foreman, MD   650 mg at 06/18/15 1221   Or  . acetaminophen (TYLENOL) suppository 650 mg  650 mg Rectal Q6H PRN Harrie Foreman, MD      . amoxicillin-clavulanate (AUGMENTIN) 400-57 MG/5ML suspension 500 mg  500 mg Oral Q12H Laverle Hobby, MD   500 mg at 06/23/15 0912  . antiseptic oral rinse solution (CORINZ)  7 mL Mouth Rinse 10 times per day Flora Lipps, MD   7 mL at 06/23/15 1256  . bisacodyl (DULCOLAX) suppository 10 mg  10 mg Rectal Daily PRN Wilhelmina Mcardle, MD      . budesonide (PULMICORT) nebulizer solution 0.5 mg  0.5 mg Nebulization BID Flora Lipps, MD   0.5 mg at 06/23/15 0814  . chlorhexidine gluconate (PERIDEX) 0.12 % solution 15 mL  15 mL Mouth Rinse BID Flora Lipps, MD   15 mL at 06/23/15 0745  . clonazePAM (KLONOPIN) tablet 0.5 mg  0.5 mg Per Tube BID Vilinda Boehringer, MD   0.5 mg at 06/23/15 0912  . famotidine (PEPCID) tablet 20 mg  20 mg Per Tube BID Wilhelmina Mcardle, MD   20 mg at 06/23/15 0912  . feeding supplement (  PRO-STAT SUGAR FREE 64) liquid 30 mL  30 mL Per Tube TID Laverle Hobby, MD      . feeding supplement (VITAL HIGH PROTEIN) liquid 1,000 mL  1,000 mL Per Tube Continuous Laverle Hobby, MD 50 mL/hr at 06/23/15 1100 1,000 mL at 06/23/15 1100  . fentaNYL (SUBLIMAZE) injection 25-100 mcg  25-100 mcg Intravenous Q2H PRN Wilhelmina Mcardle, MD   50 mcg at 06/23/15 1135  . free water 200 mL  200 mL Per Tube 3 times per day Wilhelmina Mcardle, MD   200 mL at 06/23/15 1400  . hydrALAZINE (APRESOLINE) injection 10-20 mg  10-20 mg Intravenous Q4H PRN Wilhelmina Mcardle, MD      . insulin aspart (novoLOG) injection 0-20 Units  0-20 Units Subcutaneous 6 times per day Wilhelmina Mcardle, MD   7 Units at 06/23/15 1255  . insulin glargine (LANTUS) injection 20 Units  20 Units  Subcutaneous Daily Wilhelmina Mcardle, MD   20 Units at 06/23/15 0912  . levalbuterol (XOPENEX) nebulizer solution 0.63 mg  0.63 mg Nebulization Q3H PRN Wilhelmina Mcardle, MD      . levalbuterol [facility]) nebulizer solution 0.63 mg  0.63 mg Nebulization Q6H Wilhelmina Mcardle, MD   0.63 mg at 06/23/15 0630  . losartan (COZAAR) tablet 50 mg  50 mg Oral Daily Laverle Hobby, MD   50 mg at 06/23/15 1330  . metoCLOPramide (REGLAN) injection 5 mg  5 mg Intravenous Q4H PRN Praveen Mannam, MD   5 mg at 06/19/15 0143  . metoprolol (LOPRESSOR) injection 2.5-5 mg  2.5-5 mg Intravenous Q3H PRN Wilhelmina Mcardle, MD   2.5 mg at 06/23/15 0957  . metoprolol (LOPRESSOR) tablet 100 mg  100 mg Per Tube BID Wilhelmina Mcardle, MD   100 mg at 06/23/15 0912  . midazolam (VERSED) injection 1 mg  1 mg Intravenous Q2H PRN Vilinda Boehringer, MD   1 mg at 06/21/15 2353  . phenol (CHLORASEPTIC) mouth spray 1 spray  1 spray Mouth/Throat PRN Wilhelmina Mcardle, MD      . polyethylene glycol (MIRALAX / GLYCOLAX) packet 17 g  17 g Oral Daily Wilhelmina Mcardle, MD   17 g at 06/22/15 1120  . senna-docusate (Senokot-S) tablet 1 tablet  1 tablet Per Tube BID Wilhelmina Mcardle, MD   1 tablet at 06/22/15 1123  . sodium phosphate (FLEET) 7-19 GM/118ML enema 1 enema  1 enema Rectal Once Vilinda Boehringer, MD   1 enema at 06/22/15 1015    OBJECTIVE: Filed Vitals:   06/23/15 1200 06/23/15 1300  BP: 138/92 149/90  Pulse: 124 112  Temp:    Resp: 29 25     Body mass index is 35.52 kg/(m^2).    ECOG FS:4 - Bedbound  General:  intubated Eyes: Pink conjunctiva, anicteric sclera. HEENT:  Trach tube in place. Lungs: Clear to auscultation bilaterally. Heart: Regular rate and rhythm. No rubs, murmurs, or gallops. Abdomen: Soft, nontender, nondistended. No organomegaly noted, normoactive bowel sounds. Musculoskeletal: No edema, cyanosis, or clubbing. Neuro: Alert. Skin: No rashes or petechiae noted.   LAB RESULTS:  Lab Results  Component Value Date    NA 147* 06/22/2015   K 3.8 06/22/2015   CL 106 06/22/2015   CO2 34* 06/22/2015   GLUCOSE 140* 06/22/2015   BUN 29* 06/22/2015   CREATININE 0.81 06/22/2015   CALCIUM 8.5* 06/22/2015   PROT 4.8* 06/08/2015   ALBUMIN 2.1* 06/08/2015   AST 22 06/08/2015   ALT 45  06/08/2015   ALKPHOS 113 06/08/2015   BILITOT 1.0 06/08/2015   GFRNONAA >60 06/22/2015   GFRAA >60 06/22/2015    Lab Results  Component Value Date   WBC 9.6 06/21/2015   NEUTROABS 8.0* 06/21/2015   HGB 8.0* 06/21/2015   HCT 24.6* 06/21/2015   MCV 90.7 06/21/2015   PLT 256 06/21/2015     STUDIES: Dg Chest 1 View  05/28/2015  CLINICAL DATA:  Tracheostomy tube placement EXAM: CHEST 1 VIEW COMPARISON:  06/15/2015 FINDINGS: Tracheostomy tube as been placed. The tip is 7.6 cm from the carina. Large right pleural effusion and associated pulmonary opacity are stable. Opacity at the left base is stable. Borderline cardiomegaly. No pneumothorax. Right upper extremity PICC is stable. IMPRESSION: Tracheostomy tube as been placed as described Stable large right pleural effusion and bilateral pulmonary opacity. Electronically Signed   By: Marybelle Killings M.D.   On: 06/22/2015 19:18   Dg Chest 1 View  06/15/2015  CLINICAL DATA:  Dyspnea. History of COPD, congestive heart failure, hypertension, chronic kidney disease. EXAM: CHEST 1 VIEW COMPARISON:  06/12/2015 FINDINGS: Endotracheal tube, enteric catheter, right PICC line terminating in the expected location of proximal superior vena cava are stable. Cardiomediastinal silhouette is borderline enlarged. Mediastinal contours appear intact. The ureter demonstrates atherosclerotic calcifications of the arch. There is no evidence of pneumothorax. There is slight improvement of the aeration of the right lung with large right pleural effusion. Left lower lobe atelectasis is noted. Osseous structures are without acute abnormality. Soft tissues are grossly normal. IMPRESSION: Slight improvement of the  aeration of the right lung, with persistent large right pleural effusion, an diffuse haziness of the visualized lung parenchyma. Atelectasis of the left lower lobe. Electronically Signed   By: Fidela Salisbury M.D.   On: 06/15/2015 09:12   Dg Chest 1 View  05/31/2015  CLINICAL DATA:  Dyspnea. EXAM: CHEST 1 VIEW COMPARISON:  05/30/2015 FINDINGS: ET tube tip is above the carina. There is a nasogastric tube in place. Right arm PICC line tip is in the SVC. Loculated right pleural effusion is unchanged from previous exam. Cardiac enlargement and aortic atherosclerosis again noted. IMPRESSION: 1. No change and large loculated right pleural effusion. 2. Cardiac enlargement and aortic atherosclerosis. Electronically Signed   By: Kerby Moors M.D.   On: 05/31/2015 09:31   Dg Chest 1 View  05/30/2015  CLINICAL DATA:  Shortness of breath. EXAM: CHEST 1 VIEW COMPARISON:  05/29/2015.  CT 05/20/2015 . FINDINGS: Endotracheal tube, NG tube, right PICC line in stable position. Persistent consolidation of the right lung and right pleural effusion again noted . Heart size stable. No pneumothorax. No acute osseous abnormality. IMPRESSION: 1. Lines and tubes in stable position. 2. Persistent consolidation of the right lung with prominent right pleural effusion. No interim change from prior exam. Electronically Signed   By: Marcello Moores  Register   On: 05/30/2015 07:15   Dg Chest 1 View  05/29/2015  CLINICAL DATA:  Dyspnea, respiratory failure, COPD, lung mass EXAM: CHEST 1 VIEW COMPARISON:  Portable chest x-ray of May 28, 2015 FINDINGS: There is persistent near total atelectasis of the right lung with some aeration noted in the lower hemi thorax. The left lung is clear. There is no significant mediastinal shift. The cardiac silhouette is normal in size. The endotracheal tube tip lies 4.6 cm above the carina. The esophagogastric tube tip projects below the inferior margin of the image. The right-sided PICC line tip projects  over the midportion of the SVC.  IMPRESSION: Persistent consolidation of the mid and upper right lung with small amount of residual aerated right lower lung. The left lung is clear. The support tubes are in reasonable position. Electronically Signed   By: David  [place] M.D.   On: 05/29/2015 07:20   Dg Chest 1 View  05/28/2015  CLINICAL DATA:  Hypoxia EXAM: CHEST 1 VIEW COMPARISON:  May 26, 2015 FINDINGS: Endotracheal tube tip is 4.6 cm above the carina. Nasogastric tube tip and side port below the diaphragm. Central catheter tip is in the superior cava. No pneumothorax. There is complete opacification of the right hemithorax with volume loss and probable effusion. There is elevation of the right hemidiaphragm. The left lung is clear. The heart size is within normal limits. Pulmonary vascularity on the left is within normal limits. Pulmonary vascularity on the right is obscured. There is atherosclerotic change throughout the aorta. IMPRESSION: Tube and catheter positions as described without pneumothorax. Complete opacification of the right hemithorax remains with evidence of volume loss and probable pleural effusion. Left lung clear. No change in cardiac silhouette. Electronically Signed   By: Lowella Grip III M.D.   On: 05/28/2015 07:18   Dg Chest 1 View  05/26/2015  CLINICAL DATA:  Wheezing and shortness of breath EXAM: CHEST 1 VIEW COMPARISON:  Portable chest x-ray of today's date FINDINGS: There is persistent 0 opacification of the mid and upper thirds of the right hemi thorax. Only a small amount of aerated lung is visible in the lower hemi thorax. Pleural fluid on the right is suspected. The cardiac silhouette is mildly enlarged. The left lung is well-expanded. There is no focal infiltrate or pleural effusion on the left. The endotracheal tube tip projects 4.2 cm above the carina. The PICC line tip on the left overlies the junction of the proximal and midportions of the SVC. IMPRESSION: Fairly  stable appearance of the chest since the previous study with opacification of the upper 2/3 of the right hemithorax consistent with atelectasis-pneumonia and left pleural effusion. The left lung remains clear. Electronically Signed   By: David  [place] M.D.   On: 05/26/2015 13:51   Dg Abd 1 View  06/13/2015  CLINICAL DATA:  69 year old female status post nasogastric tube placement EXAM: ABDOMEN - 1 VIEW COMPARISON:  Prior abdominal radiograph 06/09/2015 FINDINGS: A nasogastric tube projects over the left lower quadrant in the region of the mid stomach (based on prior imaging). The bowel gas pattern is otherwise nonspecific. Opacification of the right lung base consistent with known large right-sided pleural effusion. No acute osseous abnormality. IMPRESSION: Well-positioned nasogastric tube overlying the gastric body. Electronically Signed   By: Jacqulynn Cadet M.D.   On: 06/06/2015 16:12   Dg Abd 1 View  06/06/2015  CLINICAL DATA:  70 year old female with abdominal distention and diarrhea. Initial encounter. EXAM: ABDOMEN - 1 VIEW COMPARISON:  06/02/2015 and earlier. FINDINGS: 2 Portable AP supine views at 1525 hours. Large body habitus. Improved bowel gas pattern since 05/30/2015, although increased gas-filled small and large bowel loops since 06/02/2015. Still, no dilated small bowel loops present. Large bowel caliber is at the upper limits of normal. Enteric tube remains in place. The side hole appears to be at the level of the diaphragm. Stable visualized osseous structures. IMPRESSION: 1. Increased gas filled large and small bowel loops since 06/02/2015 most suggestive of ileus. 2. Enteric tube in place, but side hole at the level of the distal thoracic esophagus. Advance 6 cm to place the side hole within  the stomach. Electronically Signed   By: Genevie Ann M.D.   On: 06/06/2015 15:41   Dg Abd 1 View  06/02/2015  CLINICAL DATA:  Status post OG tube placement. EXAM: ABDOMEN - 1 VIEW COMPARISON:  None.  FINDINGS: OG tube is in place with the tip in the stomach. The tube could be advanced 3-4 cm for better positioning. IMPRESSION: As above. Electronically Signed   By: Inge Rise M.D.   On: 06/02/2015 15:21   Dg Abd 1 View  06/02/2015  CLINICAL DATA:  Orogastric tube placement. EXAM: ABDOMEN - 1 VIEW COMPARISON:  Earlier today at 0800 hours. FINDINGS: Motion and patient body habitus degradation. Nasogastric tube terminates at the body of the stomach. The side-port may be above the gastroesophageal junction. No gross free intraperitoneal air. Partial right hemi thorax opacification, incompletely imaged. IMPRESSION: Degraded exam, as detailed above. Nasogastric terminating at the body of the stomach. Electronically Signed   By: Abigail Miyamoto M.D.   On: 06/02/2015 09:28   Dg Abd 1 View  06/02/2015  CLINICAL DATA:  NG tube placement . EXAM: ABDOMEN - 1 VIEW COMPARISON:  05/30/2015 . FINDINGS: NG tube tip noted at the level of the gastroesophageal junction. More distal placement should be considered. Persistent bowel distention. No free air . IMPRESSION: NG tube tip noted at the level of the gastroesophageal junction. More distal placement should be considered.These results will be called to the ordering clinician or representative by the Radiologist Assistant, and communication documented in the PACS or zVision Dashboard. Electronically Signed   By: Marcello Moores  Register   On: 06/02/2015 08:19   Dg Abd 1 View  05/30/2015  CLINICAL DATA:  Lung cancer.  Distended abdomen. EXAM: ABDOMEN - 1 VIEW COMPARISON:  05/20/2015 FINDINGS: Mild gaseous distention of the stomach and transverse colon. No small bowel dilatation to suggest obstruction. No organomegaly. No free air. IMPRESSION: Mild gaseous distention of the stomach and transverse colon. This may reflect mild ileus. Electronically Signed   By: Rolm Baptise M.D.   On: 05/30/2015 16:42   Ct Head Wo Contrast  06/20/2015  CLINICAL DATA:  Lung cancer.  Diabetes.   CHF.  Oxygen desaturation. EXAM: CT HEAD WITHOUT CONTRAST TECHNIQUE: Contiguous axial images were obtained from the base of the skull through the vertex without intravenous contrast. COMPARISON:  None FINDINGS: Sinuses/Soft tissues: Intubation. Bilateral mastoid effusions. Paranasal sinuses clear. Intracranial: Moderate motion degradation throughout. Moderate low density in the periventricular white matter likely related to small vessel disease. Cerebral atrophy which is age advanced. Carotid and vertebral artery atherosclerosis. Given motion limitation, no mass lesion, hemorrhage, hydrocephalus, acute infarct, intra-axial, or extra-axial fluid collection. IMPRESSION: 1. Moderate motion degraded exam. 2.  Cerebral atrophy and small vessel ischemic change. 3. No definite acute intracranial abnormality. Low sensitivity for intracranial metastasis secondary to noncontrast technique and motion. 4. Bilateral mastoid effusions. Electronically Signed   By: Abigail Miyamoto M.D.   On: 06/20/2015 15:33   Ct Chest W Contrast  06/06/2015  CLINICAL DATA:  Followup chest radiograph. History of CHF, COPD and hypertension. EXAM: CT CHEST WITH CONTRAST TECHNIQUE: Multidetector CT imaging of the chest was performed during intravenous contrast administration. CONTRAST:  47m OMNIPAQUE IOHEXOL 300 MG/ML  SOLN COMPARISON:  05/20/2015 FINDINGS: Mediastinum: The heart size is normal. No pericardial effusion. There is an endotracheal tube with tip above the carina. The nasogastric tube is present with tip in the body of the stomach. The index right supraclavicular lymph node measures 1.5 cm, image 5  of series 2. Previously 2.6 cm. Right paratracheal lymph node measure 2.0 cm, image 19 of series 2. Previously 3.3 cm. Pre-vascular lymph node measures 1.6 cm, image 25 of series 2. Previously 2.3 cm. Lungs/Pleura: There is a moderate size partially loculated right pleural effusion which has decreased in volume from previous exam. There has  been interval improvement in aeration to the right upper lobe and superior segment of right lower lobe compatible with decrease in size of obstructing central right lung mass. Continued complete atelectasis and consolidation of the right middle lobe. The right lung mass is difficult to visualize and accurately remeasure. Mild atelectasis is noted within the posterior and medial left lower lobe. Upper Abdomen: Stable appearance of left adrenal nodule measuring 2.9 x 1.3 cm. The right adrenal gland is normal. Low to intermediate attenuation structure within the posterior right hepatic lobe measures 2.6 cm, image 57 of series 2. Unchanged from previous exam. There is mild perihepatic ascites noted. Musculoskeletal: Scoliosis deformity involves the thoracic spine. IMPRESSION: 1. Interval response to therapy. Since the previous exam there has been improved aeration to the right upper lobe and superior segment of the right lower lobe. Findings are compatible with decrease in size of central obstructing right perihilar lesion 2. Decrease in size of mediastinal and right supraclavicular adenopathy. 3. No new or progressive disease identified. Electronically Signed   By: Kerby Moors M.D.   On: 06/06/2015 10:39   [place] Chest  06/02/2015  CLINICAL DATA:  Pleural effusion.  Lung cancer. EXAM: CHEST ULTRASOUND COMPARISON:  Chest x-ray 06/02/2015. FINDINGS: Moderate right-sided pleural effusion noted. IMPRESSION: Moderate right pleural effusion . Electronically Signed   By: Marcello Moores  Register   On: 06/02/2015 10:05   Dg Chest Port 1 View  06/22/2015  CLINICAL DATA:  69 year old female with history of respiratory failure. EXAM: PORTABLE CHEST 1 VIEW COMPARISON:  Chest x-ray 06/20/2015. FINDINGS: Tracheostomy tube in position with tip approximately 6.6 cm above the carina. A nasogastric tube is seen extending into the stomach, however, the tip of the nasogastric tube extends below the lower margin of the image. There is a  right upper extremity PICC with tip terminating in the proximal superior vena cava. Lung volumes appear normal. Large right pleural effusion slightly decreased compared to the prior examination. Multifocal opacities throughout the right mid to lower lung and in the medial left lung base. No definite left pleural effusion. No evidence of pulmonary edema. Heart size appears borderline enlarged. The patient is rotated to the left on today's exam, resulting in distortion of the mediastinal contours and reduced diagnostic sensitivity and specificity for mediastinal pathology. Atherosclerosis in the thoracic aorta. IMPRESSION: 1. Support apparatus, as above. 2. Slight decreased size of large right pleural effusion. Extensive areas of atelectasis and/or consolidation throughout the right mid to lower lung, and additionally in the medial left lung base. 3. Atherosclerosis. Electronically Signed   By: Vinnie Langton M.D.   On: 06/22/2015 14:10   Dg Chest Port 1 View  06/20/2015  CLINICAL DATA:  Ventilator dependent respiratory failure. Hypoxia. Followup right pleural effusion and postobstructive atelectasis/pneumonia in the right lower lobe and right middle lobe. EXAM: PORTABLE CHEST 1 VIEW COMPARISON:  06/19/2015 and earlier, including CT chest 06/06/2015. FINDINGS: Endotracheal tube tip in satisfactory position projecting approximately 6 cm above the carina. Right arm PICC tip projects at the junction of the right innominate vein and SVC, unchanged. Nasogastric tube courses below the diaphragm into the stomach. Since the 1114 hr examination yesterday, no  change in the dense airspace consolidation in the right middle lobe and right lower lobe and the large right pleural effusion. Prominent bronchovascular markings in the left lung, unchanged, with improvement in the pulmonary venous hypertension. No new pulmonary parenchymal abnormalities. IMPRESSION: 1. Support apparatus satisfactory. 2. No change since yesterday in  the dense atelectasis/pneumonia involving the right middle lobe and right lower lobe. 3. No change in the large right pleural effusion. 4. No new abnormalities. Electronically Signed   By: Evangeline Dakin M.D.   On: 06/20/2015 08:31   Dg Chest Port 1 View  06/19/2015  CLINICAL DATA:  Respiratory failure. Small cell carcinoma the lung. Right pleural effusion. EXAM: PORTABLE CHEST 1 VIEW 11:13 a.m. COMPARISON:  Radiographs from 06/08/2015 through 06/19/2015 at 5:42 a.m. and CT scan of the chest 06/06/2015 FINDINGS: The large right pleural effusion has decreased since the prior exam. PICC tip in the upper superior vena cava. Tracheostomy tube in good position. NG tube tip below the diaphragm. Left lung is clear. Pulmonary vascularity is normal. Right mediastinal adenopathy is noted. IMPRESSION: Decreased large right pleural effusion.  No pneumothorax. Electronically Signed   By: Lorriane Shire M.D.   On: 06/19/2015 11:29   Dg Chest Port 1 View  06/19/2015  CLINICAL DATA:  Respiratory failure with increasing difficulty breathing. History of COPD, diabetes and lung cancer. EXAM: PORTABLE CHEST 1 VIEW COMPARISON:  Radiographs 06/18/2015 and 06/02/2015.  CT 06/06/2015. FINDINGS: 0954 hour. Right arm PICC has been slightly advanced to the level of the upper SVC. Tracheostomy and nasogastric tube appear unchanged. There is progressive volume loss and opacification in the right hemithorax consistent with progressive partial lung collapse. Underlying right pleural effusion appears unchanged. There is improved aeration of the left lung. The heart size appears unchanged. IMPRESSION: Progressive partial right lung collapse with increased pleural parenchymal opacities on the right hemithorax. Support system positioned as above. Electronically Signed   By: Richardean Sale M.D.   On: 06/19/2015 10:11   Dg Chest Port 1 View  06/18/2015  CLINICAL DATA:  Respiratory failure. EXAM: PORTABLE CHEST 1 VIEW COMPARISON:   05/27/2015. FINDINGS: Tracheostomy tube, NG tube, right PICC line stable position. PICC line tip projected over the right subclavian vein . Persistent cardiomegaly. Persistent bilateral pulmonary infiltrates particularly prominent on the right. Persistent bilateral pleural effusions. No interim improvement. No pneumothorax. IMPRESSION: 1. Lines and tubes in stable position. 2. Persistent cardiomegaly with unchanged bilateral pulmonary infiltrates, particular prominent on the right. Persistent bilateral pleural effusions. Findings are consistent with congestive heart failure with asymmetric pulmonary edema. Pneumonia, particularly on the right cannot be excluded. Electronically Signed   By: Marcello Moores  Register   On: 06/18/2015 07:53   Dg Chest Port 1 View  06/06/2015  CLINICAL DATA:  Respiratory failure. EXAM: PORTABLE CHEST 1 VIEW COMPARISON:  06/06/2015. FINDINGS: Tracheostomy tube, NG tube, right PICC line in stable position. Cardiomegaly with pulmonary vascular prominence and bilateral pulmonary alveolar infiltrates particularly the right. Findings suggest congestive heart failure with asymmetric pulmonary edema. Pneumonia cannot be excluded. No pneumothorax. IMPRESSION: 1. Lines and tubes in stable position. 2. Cardiomegaly with persistent bilateral pulmonary infiltrates, particularly prominent on the right. Persistent right pleural effusion. Findings suggest congestive heart failure with asymmetric pulmonary edema. Pneumonia cannot be excluded. No interim change from prior exam . Electronically Signed   By: Thrall   On: 06/21/2015 07:28   Dg Chest Port 1 View  06/12/2015  CLINICAL DATA:  Short of breath. EXAM: PORTABLE CHEST 1 VIEW  COMPARISON:  06/09/2015 FINDINGS: Endotracheal tube remains in good position. Right arm PICC tip in the proximal SVC unchanged. NG tube in place with the tip not visualized Complete opacification of the right chest which has progressed since the prior study. This is  likely due to pleural effusion and collapse. Underlying pneumonia or tumor not excluded. Cardiac enlargement. Vascular congestion and on the left without pulmonary edema or effusion on the left. IMPRESSION: Endotracheal tube remains in good position Complete opacification of the right chest due to enlarging effusion and collapse of the right lung. Electronically Signed   By: Franchot Gallo M.D.   On: 06/12/2015 07:44   Dg Chest Port 1 View  06/09/2015  CLINICAL DATA:  Intubation and OG tube placement EXAM: PORTABLE CHEST 1 VIEW COMPARISON:  1 day prior FINDINGS: Endotracheal tube terminates 4.1 cm above carina.Nasogastric tube extends beyond the inferior aspect of the film. A right-sided PICC line is poorly visualized centrally but likely terminates at the low SVC. The patient is minimally rotated left. Cardiomegaly accentuated by AP portable technique. Layering right pleural effusion. No pneumothorax. Interstitial edema is asymmetric and worse on the right. Lower lobe predominant right worse than left airspace disease is minimally improved. IMPRESSION: Minimal improvement in aeration since the exam of 1 day prior. Congestive heart failure with asymmetric interstitial and airspace opacities, worse on the right. Concurrent infection cannot be excluded. Layering right pleural effusion. Electronically Signed   By: Abigail Miyamoto M.D.   On: 06/09/2015 12:09   Dg Chest Port 1 View  06/08/2015  CLINICAL DATA:  Respiratory failure.  Followup exam. EXAM: PORTABLE CHEST 1 VIEW COMPARISON:  Chest CT and chest radiographs, 06/06/2015. FINDINGS: There is near complete opacification of the right hemi thorax consistent with combination of atelectasis/ consolidation and pleural fluid. There is, however, some more aeration of the mid to upper lung on the right than there was on the prior chest radiographs. Left lung is essentially clear.  No pneumothorax. Right-sided PICC is stable. Endotracheal tube and nasogastric tube have  been removed. IMPRESSION: 1. Mild improvement from prior study with some more aeration evident of the right mid and upper lung. This is consistent with a decrease in lung consolidation/atelectasis. There is still significant opacification throughout most of the right hemi thorax. No new abnormalities. Electronically Signed   By: Lajean Manes M.D.   On: 06/08/2015 08:14   Dg Chest Port 1 View  06/06/2015  CLINICAL DATA:  Respiratory failure . EXAM: PORTABLE CHEST 1 VIEW COMPARISON:  06/02/2015.  CT 05/20/2015 . FINDINGS: Endotracheal tube 8 cm above the carina at the thoracic inlet. Slight interim advancement should be considered. NG tube and right PICC line in stable position. Progressive consolidation/ atelectasis of the right lung and progressive right pleural effusion. Right pleural effusion is large. Left lung is clear. Stable cardiomegaly. IMPRESSION: 1. Endotracheal tube 8 cm above the carina at the thoracic inlet. Slightly mid aspect should be considered. NG tube and right PICC line stable position. 2. Progression of consolidation/atelectasis of the right lung with progression of the right pleural effusion. The right pleural effusion is large. 3. Stable cardiomegaly. Electronically Signed   By: Marcello Moores  Register   On: 06/06/2015 07:11   Dg Chest Port 1 View  06/02/2015  CLINICAL DATA:  69 year old female with a history of small cell lung cancer EXAM: PORTABLE CHEST 1 VIEW COMPARISON:  Multiple prior plain film, including 05/31/2015, limb 10/2014, 05/29/2015, CT chest 05/20/2015 FINDINGS: Cardiomediastinal silhouette obscured by overlying lung/  pleural disease. Unchanged position of endotracheal tube, terminating suitably above the carina, 4.5 cm. Unchanged gastric tube, terminating out of the field of view. Unchanged right upper extremity PICC, which terminates at the level the right mainstem bronchus. Overlying EKG leads and ventilator tubing. Atherosclerosis of the aortic arch. Dense opacity of the  right lung persists, with obscuration the right hemidiaphragm, right heart borders, and the majority of the right lung. Minimal maintained aeration at the right apex. Airspace opacity at the left base persists, with similar aeration to the prior. No pneumothorax. IMPRESSION: Similar appearance of dense right-sided airspace opacity, likely a combination of lung consolidation, atelectasis, tumor tissue, and pleural effusion. Unchanged position of support apparatus, as above. Airspace disease at the left base, similar to the comparison, likely combination of atelectasis, edema, consolidation. Small left pleural effusion not excluded. Atherosclerosis. Signed, Dulcy Fanny. Earleen Newport, DO [facility] Electronically Signed   By: Corrie Mckusick D.O.   On: 06/02/2015 08:23   Dg Chest Port 1 View  05/26/2015  CLINICAL DATA:  Respiratory failure. EXAM: PORTABLE CHEST 1 VIEW COMPARISON:  05/25/2015 .  CT 05/20/2015. FINDINGS: Endotracheal tube and NG tube noted in stable position. Interim increase consolidation in the right upper lobe. Persistent right pleural effusion. Left lung is clear. Stable cardiomegaly. No pneumothorax. No acute osseus abnormality P IMPRESSION: Interim increase in consolidation of the right upper lobe with persistent right pleural effusion. Persistent occlusion of the right mainstem bronchus appears to be present. Electronically Signed   By: Rosita   On: 05/26/2015 07:13   Dg Chest Port 1 View  05/25/2015  CLINICAL DATA:  Respiratory failure. EXAM: PORTABLE CHEST 1 VIEW COMPARISON:  05/21/2015 FINDINGS: The ET tube tip is above the carina. The nasogastric tube tip is below the GE junction. Normal heart size. Aortic atherosclerosis. Partially loculated right pleural effusion is again identified and appears unchanged from the previous exam. Left lung is clear. IMPRESSION: 1. Loculated right pleural effusion is stable. 2. Left lung is  clear. Electronically Signed   By: Kerby Moors M.D.   On: 05/25/2015 09:03   Dg Abd Portable 1v  06/22/2015  CLINICAL DATA:  NG tube residuals.  Check placement EXAM: PORTABLE ABDOMEN - 1 VIEW COMPARISON:  Radiograph 06/23/2015 FINDINGS: NG tube extends into the stomach. The side port is below the GE junction. Tip is below the margin of film. Second film was provided which demonstrates tip within the gastric body. Bilateral basilar effusions and atelectasis. IMPRESSION: NG tube appears in proper position. Electronically Signed   By: Suzy Bouchard M.D.   On: 06/22/2015 09:29   Dg Abd Portable 1v  06/09/2015  CLINICAL DATA:  OG tube placement EXAM: PORTABLE ABDOMEN - 1 VIEW COMPARISON:  06/06/2015 FINDINGS: The nasogastric tube tip is in the body of stomach. Side port is well below the GE junction. Mild gaseous distension of large and small bowel loops unchanged. IMPRESSION: 1. NG tube tip is in the body of stomach. Electronically Signed   By: Kerby Moors M.D.   On: 06/09/2015 12:04    ASSESSMENT:  Small cell lung cancer.   PLAN:    1. Small cell lung cancer: Patient is likely stage IV given her suspicious adrenal lesion. Recent CT of her head on June 20, 2015 did not report any metastatic disease.  CT scan results from June 06, 2015 revealed a positive response to cycle 1 of treatment with decreased size of her mass. Her persistent requirement  for mechanical ventilation may more likely be secondary to poor lung function, pleural effusion, and COPD than external compression of her mass at this point. Patient wishes to proceed with her next round of chemotherapy, therefore will start cycle 2, day 1 of cisplatin and etoposide tomorrow. She will receive etoposide only on Wednesday and Thursday.  She will ultimately require port placement once she is extubated. XRT would be helpful, but there are patient safety concerns transporting to radiation oncology.   2. Thrombocytopenia: Platelet  count is now within normal limits. Monitor. 3. Anemia:  Likely secondary to chemotherapy. Continue to transfuse if hemoglobin falls below 7.0.  4. Tracheostomy:  Appreciate ENT input. Tracheostomy in place.  5. Disposition: Long-term ventilation and tracheostomy facilities have been discussed, but this poses a problem for transportation back and forth for chemotherapy. Case previously discussed with case management as well as long-term facility representative.   Case discussed with nursing and pulmonology.  Will follow.   Lloyd Huger, MD   06/23/2015 1:57 PM

## 2015-06-23 NOTE — Progress Notes (Addendum)
Spontaneous breathing trial attempted with patient in pressure support mode. Patient became very tachycardiac, tachypneic, diaphoretic, and had labored breathing accessory muscle use. Per RT and RN collaboration, RN gave patient prn fentanyl dose and RT to end SBT.

## 2015-06-23 NOTE — Care Management (Signed)
No family present. Trach/vent. NG feeds. RNCM will continue to follow.

## 2015-06-23 NOTE — Progress Notes (Signed)
Nutrition Follow-up    INTERVENTION:   EN: recommend increasing TF to rate of 50 ml/hr with increase in Prostat to TID to maximize nutrition; provides 1500 kcals, 151 g of protein, 1008 mL of free water. Additional free water flushes as per MD order Coordination of Care: pt now with liquid stool with re-insertion of rectal tube; discussed holding bowel regimen today during ICU rounds. Also discussed if further issues with high residuals or vomiting, recommend changing reglan dosing to scheduled   NUTRITION DIAGNOSIS:   Inadequate oral intake related to acute illness as evidenced by NPO status.Being addressed via TF GOAL:   Patient will meet greater than or equal to 90% of their needs  MONITOR:    (Energy Intake, Anthropometrics, Electrolyte/Renal Profile, Digestive System, Electrolyte/Renal Profile)  REASON FOR ASSESSMENT:   Consult Enteral/tube feeding initiation and management  ASSESSMENT:     Pt remains on vent via trach  EN: tolerating Vital High Protein at rate of 45 ml/hr with Prostat daily  Skin:   (Stage II pressure ulcer on sacrum), now stage III per wound care RN notes  Digestive System: no vomiting, no report of nausea, pt +BM now with rectal tube reinserted, abdomen soft, abdominal xray yesterday confirmed NG placement and negative for acute abnormalities  Electrolyte and Renal Profile:  Recent Labs Lab 06/19/15 0330 06/20/15 0535 06/21/15 0610 06/22/15 0530 06/22/15 0542  BUN 32* 32* 31* 33* 29*  CREATININE 0.78 0.80 0.80 0.76 0.81  NA 143 146* 146* 148* 147*  K 4.0 3.5 3.2* 3.6 3.8  MG 1.9 1.8  --  1.7 1.8  PHOS 4.8* 4.6  --  4.1  --    Glucose Profile:  Recent Labs  06/22/15 2340 06/23/15 0445 06/23/15 0714  GLUCAP 121* 138* 117*   Meds: ss novolog, lantus, reglan prn, bowel regimen (miralax daily, senokot daily, fleet enema ordered but not given, dulcoalx suppository prn) Height:   Ht Readings from Last 1 Encounters:  05/15/2015 '5\' 7"'$   (1.702 m)    Weight:   Wt Readings from Last 1 Encounters:  06/22/15 226 lb 13.7 oz (102.9 kg)    BMI:  Body mass index is 35.52 kg/(m^2).  Estimated Nutritional Needs:   Kcal:  1254-1596kcals, (11-14kcals/kg) using current weight of 114kg)  Protein:  122-153 g (2.0-2.5 g/kg IBW) or 140-176 g (1.2-1.5 g/kg current wt)  Fluid:  1535-1818m of fluid (25-3109mkg)  EDUCATION NEEDS:   No education needs identified at this time  HISt. JosephRD, LDN ([phone]

## 2015-06-24 ENCOUNTER — Inpatient Hospital Stay: Payer: Medicare Other

## 2015-06-24 LAB — BLOOD GAS, ARTERIAL
ACID-BASE EXCESS: 8.3 mmol/L — AB (ref 0.0–3.0)
Allens test (pass/fail): POSITIVE — AB
Bicarbonate: 31.8 mEq/L — ABNORMAL HIGH (ref 21.0–28.0)
FIO2: 0.3
MECHVT: 500 mL
Mechanical Rate: 15
O2 Saturation: 93.5 %
PEEP/CPAP: 5 cmH2O
PH ART: 7.52 — AB (ref 7.350–7.450)
Patient temperature: 37
pCO2 arterial: 39 mmHg (ref 32.0–48.0)
pO2, Arterial: 61 mmHg — ABNORMAL LOW (ref 83.0–108.0)

## 2015-06-24 LAB — BASIC METABOLIC PANEL
Anion gap: 7 (ref 5–15)
BUN: 40 mg/dL — AB (ref 6–20)
CO2: 30 mmol/L (ref 22–32)
CREATININE: 0.81 mg/dL (ref 0.44–1.00)
Calcium: 8.3 mg/dL — ABNORMAL LOW (ref 8.9–10.3)
Chloride: 113 mmol/L — ABNORMAL HIGH (ref 101–111)
GFR calc Af Amer: >60 mL/min (ref >60)
Glucose, Bld: 139 mg/dL — ABNORMAL HIGH (ref 65–99)
POTASSIUM: 3.4 mmol/L — AB (ref 3.5–5.1)
SODIUM: 150 mmol/L — AB (ref 135–145)

## 2015-06-24 LAB — CBC
HCT: 22.9 % — ABNORMAL LOW (ref 35.0–47.0)
HEMOGLOBIN: 7.2 g/dL — AB (ref 12.0–16.0)
MCH: 29 pg (ref 26.0–34.0)
MCHC: 31.4 g/dL — AB (ref 32.0–36.0)
MCV: 92.2 fL (ref 80.0–100.0)
Platelets: 252 10*3/uL (ref 150–440)
RBC: 2.49 MIL/uL — ABNORMAL LOW (ref 3.80–5.20)
RDW: 19.1 % — AB (ref 11.5–14.5)
WBC: 8.1 10*3/uL (ref 3.6–11.0)

## 2015-06-24 LAB — GLUCOSE, CAPILLARY
Glucose-Capillary: 137 mg/dL — ABNORMAL HIGH (ref 65–99)
Glucose-Capillary: 146 mg/dL — ABNORMAL HIGH (ref 65–99)
Glucose-Capillary: 128 mg/dL — ABNORMAL HIGH (ref 65–99)
Glucose-Capillary: 177 mg/dL — ABNORMAL HIGH (ref 65–99)
Glucose-Capillary: 154 mg/dL — ABNORMAL HIGH (ref 65–99)

## 2015-06-24 LAB — CULTURE, BAL-QUANTITATIVE W GRAM STAIN: Culture: NORMAL

## 2015-06-24 LAB — MAGNESIUM: MAGNESIUM: 1.9 mg/dL (ref 1.7–2.4)

## 2015-06-24 MED ORDER — SODIUM CHLORIDE 0.9 % IV SOLN
Freq: Once | INTRAVENOUS | Status: AC
  Administered 2015-06-24: 17:00:00 via INTRAVENOUS
  Filled 2015-06-24: qty 5

## 2015-06-24 MED ORDER — SENNOSIDES-DOCUSATE SODIUM 8.6-50 MG PO TABS: 1.0000 | ORAL_TABLET | ORAL | Status: DC

## 2015-06-24 MED ORDER — COLLAGENASE 250 UNIT/GM EX OINT
TOPICAL_OINTMENT | Freq: Every day | CUTANEOUS | Status: DC
  Administered 2015-06-24 – 2015-07-01 (×8): via TOPICAL
  Filled 2015-06-24: qty 30

## 2015-06-24 MED ORDER — ALTEPLASE 2 MG IJ SOLR
2.0000 mg | Freq: Once | INTRAMUSCULAR | Status: AC | PRN
  Administered 2015-06-25: 2 mg
  Filled 2015-06-24: qty 2

## 2015-06-24 MED ORDER — ACETYLCYSTEINE 20 % IN SOLN
3.0000 mL | Freq: Four times a day (QID) | RESPIRATORY_TRACT | Status: AC
Start: 2015-06-24 — End: 2015-06-27
  Administered 2015-06-24 – 2015-06-25 (×3): 3 mL via RESPIRATORY_TRACT
  Administered 2015-06-25: 4 mL via RESPIRATORY_TRACT
  Administered 2015-06-25: 3 mL via RESPIRATORY_TRACT
  Administered 2015-06-25 – 2015-06-26 (×2): 4 mL via RESPIRATORY_TRACT
  Administered 2015-06-26: 3 mL via RESPIRATORY_TRACT
  Administered 2015-06-26 (×2): 4 mL via RESPIRATORY_TRACT
  Administered 2015-06-27 (×2): 3 mL via RESPIRATORY_TRACT
  Filled 2015-06-24 (×13): qty 4

## 2015-06-24 MED ORDER — SODIUM CHLORIDE 0.9 % IV SOLN
Freq: Once | INTRAVENOUS | Status: AC
  Administered 2015-06-24: 15:00:00 via INTRAVENOUS

## 2015-06-24 MED ORDER — SODIUM CHLORIDE 0.9 % IV SOLN
180.0000 mg | Freq: Once | INTRAVENOUS | Status: AC
  Administered 2015-06-24: 180 mg via INTRAVENOUS
  Filled 2015-06-24: qty 180

## 2015-06-24 MED ORDER — HOT PACK MISC ONCOLOGY
1.0000 | Freq: Once | Status: AC | PRN
  Filled 2015-06-24: qty 1

## 2015-06-24 MED ORDER — POTASSIUM CHLORIDE 20 MEQ/15ML (10%) PO SOLN
20.0000 meq | Freq: Once | ORAL | Status: AC
  Administered 2015-06-24: 20 meq
  Filled 2015-06-24: qty 15

## 2015-06-24 MED ORDER — SODIUM CHLORIDE 0.9 % IJ SOLN: 10.0000 mL | INTRAMUSCULAR | Status: DC | PRN

## 2015-06-24 MED ORDER — POTASSIUM CHLORIDE 2 MEQ/ML IV SOLN
Freq: Once | INTRAVENOUS | Status: AC
  Administered 2015-06-24: 15:00:00 via INTRAVENOUS
  Filled 2015-06-24: qty 10

## 2015-06-24 MED ORDER — ANTISEPTIC ORAL RINSE SOLUTION (CORINZ)
7.0000 mL | Freq: Four times a day (QID) | OROMUCOSAL | Status: DC
  Administered 2015-06-24 – 2015-07-12 (×62): 7 mL via OROMUCOSAL
  Filled 2015-06-24 (×61): qty 7

## 2015-06-24 MED ORDER — FLUCONAZOLE 40 MG/ML PO SUSR
100.0000 mg | Freq: Every day | ORAL | Status: DC
  Administered 2015-06-24: 100 mg
  Filled 2015-06-24 (×2): qty 2.5

## 2015-06-24 MED ORDER — HEPARIN SOD (PORK) LOCK FLUSH 100 UNIT/ML IV SOLN
250.0000 [IU] | Freq: Once | INTRAVENOUS | Status: AC | PRN
  Administered 2015-06-25: 250 [IU]
  Filled 2015-06-24: qty 3

## 2015-06-24 MED ORDER — SODIUM CHLORIDE 0.9 % IV SOLN
80.0000 mg/m2 | Freq: Once | INTRAVENOUS | Status: AC
  Administered 2015-06-24: 180 mg via INTRAVENOUS
  Filled 2015-06-24: qty 9

## 2015-06-24 MED ORDER — FREE WATER
300.0000 mL | Freq: Three times a day (TID) | Status: DC
  Administered 2015-06-24 – 2015-06-25 (×3): 300 mL

## 2015-06-24 MED ORDER — POLYETHYLENE GLYCOL 3350 17 G PO PACK
17.0000 g | PACK | Freq: Every day | ORAL | Status: DC | PRN
  Administered 2015-07-10: 17 g via ORAL
  Filled 2015-06-24: qty 1

## 2015-06-24 MED ORDER — HEPARIN SOD (PORK) LOCK FLUSH 100 UNIT/ML IV SOLN
500.0000 [IU] | Freq: Once | INTRAVENOUS | Status: AC | PRN
  Administered 2015-06-25: 500 [IU]
  Filled 2015-06-24: qty 5

## 2015-06-24 MED ORDER — PALONOSETRON HCL INJECTION 0.25 MG/5ML
0.2500 mg | Freq: Once | INTRAVENOUS | Status: AC
  Administered 2015-06-24: 0.25 mg via INTRAVENOUS
  Filled 2015-06-24: qty 5

## 2015-06-24 MED ORDER — SODIUM CHLORIDE 0.9 % IJ SOLN: 3.0000 mL | INTRAMUSCULAR | Status: DC | PRN

## 2015-06-24 NOTE — Progress Notes (Signed)
Nutrition Follow-up    INTERVENTION:   EN: recommend continuing current TF regimen; discussed hypernatremia during ICU rounds with MD Ashby Dawes, MD wanting to increase free water flushes to 300 mL q 8 hours; orders modified. Continue to assess   NUTRITION DIAGNOSIS:   Inadequate oral intake related to acute illness as evidenced by NPO status.  GOAL:   Patient will meet greater than or equal to 90% of their needs  MONITOR:    (Energy Intake, Anthropometrics, Electrolyte/Renal Profile, Digestive System, Electrolyte/Renal Profile)  REASON FOR ASSESSMENT:   Consult Enteral/tube feeding initiation and management  ASSESSMENT:     Pt remains on vent via trach,  2nd cycle of chemo to start   EN:    tolerating Vital High Protein at rate of 50 ml/hr, Prostat TID  Skin:  Stage III pressure injury to left lower buttocks  Digestive System: no signs of TF intolerance, flexiseal in place with loose stools (bowel regimen adjusted)  Electrolyte and Renal Profile:  Recent Labs Lab 06/19/15 0330 06/20/15 0535  06/22/15 0530 06/22/15 0542 06/24/15 0500  BUN 32* 32*  < > 33* 29* 40*  CREATININE 0.78 0.80  < > 0.76 0.81 0.81  NA 143 146*  < > 148* 147* 150*  K 4.0 3.5  < > 3.6 3.8 3.4*  MG 1.9 1.8  --  1.7 1.8 1.9  PHOS 4.8* 4.6  --  4.1  --   --   < > = values in this interval not displayed. Glucose Profile:  Recent Labs  06/24/15 0401 06/24/15 0714 06/24/15 1115  GLUCAP 137* 146* 128*    Meds: reviewed  Height:   Ht Readings from Last 1 Encounters:  05/26/2015 '5\' 7"'$  (1.702 m)    Weight:   Wt Readings from Last 1 Encounters:  06/24/15 230 lb 9.6 oz (104.6 kg)    BMI:  Body mass index is 36.11 kg/(m^2).  Estimated Nutritional Needs:   Kcal:  1254-1596kcals, (11-14kcals/kg) using current weight of 114kg)  Protein:  122-153 g (2.0-2.5 g/kg IBW) or 140-176 g (1.2-1.5 g/kg current wt)  Fluid:  1535-1872m of fluid (25-34mkg)  EDUCATION NEEDS:   No  education needs identified at this time  HIArmonkRD, LDN [phone]

## 2015-06-24 NOTE — Progress Notes (Signed)
Pt receiving 2nd cycle of chemotherapy today. Alert. Educated on s/s side effects of chemo and pt trached so nodded head in affirmative to education. Chemo given via rt upper arm dual picc line  With excellent blood return.

## 2015-06-24 NOTE — Consult Note (Signed)
Electrolyte CONSULT NOTE - FOLLOW UP  Pharmacy Consult for Electrolyte monitoring and replacement  Patient Measurements: Height: '5\' 7"'$  (170.2 cm) Weight: 230 lb 9.6 oz (104.6 kg) IBW/kg (Calculated) : 61.6  Vital Signs: Temp: 99.7 F (37.6 C) (11/29 0500) Temp Source: Oral (11/29 0500) BP: 117/70 mmHg (11/29 0913) Pulse Rate: 125 (11/29 0913)    Recent Labs  06/22/15 0530 06/22/15 0542 06/24/15 0500  NA 148* 147* 150*  K 3.6 3.8 3.4*  CL 110 106 113*  CO2 32 34* 30  GLUCOSE 147* 140* 139*  BUN 33* 29* 40*  CREATININE 0.76 0.81 0.81  CALCIUM 8.5* 8.5* 8.3*  MG 1.7 1.8 1.9  PHOS 4.1  --   --    Estimated Creatinine Clearance: 83.8 mL/min (by C-G formula based on Cr of 0.81).    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 Pharmacy consulted for monitoring and managing electrolytes.   K:3.4  Plan:  Electrolytes are wnl except potassium. Will replace with 57mq per tube.  Pharmacy will continue to monitor electrolytes daily and replace as needed.  SNancy Fetter PharmD Pharmacy Resident 9:35 AM 06/24/2015

## 2015-06-24 NOTE — Clinical Social Work Note (Signed)
Clinical Social Worker is continuing to follow for disposition. Pt has a complicated discharge plan. Pt is unable to go to LTAC and her cancer treatment remains to be a barrier. Pt is still vented, therefore local SNFs will not be able to manage pt. CSW will continue to follow.  Darden Dates, MSW, LCSW Clinical Social Worker [phone]

## 2015-06-24 NOTE — Progress Notes (Signed)
RASS -1,. On vent. Poorly tolerant of weaning efforts. Episodic PSVT. Episodic desaturations. Intermittent agitation. FiO2 decreased to 28%, no significant agitation lastnight, awake and more alert this morning, but still with extremity flaccidity, CT head with no lesion, but b/l mastoid effusions.  Thick purulent secretions noted. S/P Bronch with BAL on 11/27; pending.   Filed Vitals:   06/24/15 0700 06/24/15 0800 06/24/15 0900 06/24/15 0913  BP: 144/82 143/79 117/70 117/70  Pulse: 116 118 129 125  Temp:  100.1 F (37.8 C)    TempSrc:  Oral    Resp: '28 28 30   '$ Height:      Weight:      SpO2: 97% 96% 97%    HEENT WNL Trach site with thick purulent secretions Scattered rhonchi, prolonged exp phase Reg, no M Obese, soft, diminished BS Ext warm, symmetric edema Neuro - mild right sided weakness, more awake and alert, following simple commands, still with overall flaccid extermities   CMP Latest Ref Rng 06/24/2015 06/22/2015 06/22/2015  Glucose 65 - 99 mg/dL 139(H) 140(H) 147(H)  BUN 6 - 20 mg/dL 40(H) 29(H) 33(H)  Creatinine 0.44 - 1.00 mg/dL 0.81 0.81 0.76  Sodium 135 - 145 mmol/L 150(H) 147(H) 148(H)  Potassium 3.5 - 5.1 mmol/L 3.4(L) 3.8 3.6  Chloride 101 - 111 mmol/L 113(H) 106 110  CO2 22 - 32 mmol/L 30 34(H) 32  Calcium 8.9 - 10.3 mg/dL 8.3(L) 8.5(L) 8.5(L)  Total Protein 6.5 - 8.1 g/dL - - -  Total Bilirubin 0.3 - 1.2 mg/dL - - -  Alkaline Phos 38 - 126 U/L - - -  AST 15 - 41 U/L - - -  ALT 14 - 54 U/L - - -    CBC    Component Value Date/Time   WBC 8.1 06/24/2015 0500   RBC 2.49* 06/24/2015 0500   RBC 2.71* 06/21/2015 0610   HGB 7.2* 06/24/2015 0500   HCT 22.9* 06/24/2015 0500   PLT 252 06/24/2015 0500   MCV 92.2 06/24/2015 0500   MCH 29.0 06/24/2015 0500   MCHC 31.4* 06/24/2015 0500   RDW 19.1* 06/24/2015 0500   LYMPHSABS 0.9* 06/21/2015 0610   MONOABS 0.6 06/21/2015 0610   EOSABS 0.0 06/21/2015 0610   BASOSABS 0.1 06/21/2015 0610    CXR: [place] [facility]  opacities  CT head 11/25>> no IC lesion, bilateral Mastoid effusion  Significant Events 11/21-11/27 11/22>>bronchoscopy, thick secretions cleared 11/24>>bronchoscopy for RLL, RML collapse>>large mucus plugs removed.  11/27>>bronchoscopy with BAL RML, thick secretions, no mucus plugs. Cultures showed normal flora.  Abx Augmentin 11/25>>for 5 day course.    IMPRESSION: Prolonged VDRF 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, chemotherapy  Significant improvement in airway patency by bronchoscopy 11/22 COPD  Mucus plugging PSVT  Anemia - improving Thrombocytopenia - improving Agitated delirium Volume overload Large TF residuals  - resolved Bilateral Mastoid effusions/sinusitis Tracheitis ?Paraneoplastic Syndrome -  [name]. Thrush  PLAN/REC: Cont vent support - settings reviewed and/or adjusted, poorly tolerant of weaning trial today. Cont nebulized steroids Cont nebulized bronchodilators. Add fluconazole for 3 days for thrush. Add nebulized NAC 11/29 for 3 days. Cont scheduled enteral metoprolol PRN IV metoprolol to maintain HR < 115/min Monitor BMET intermittently, check CE Monitor I/Os Correct electrolytes as indicated DVT px: SCDs Monitor CBC intermittently Transfuse per usual guidelines Patient with depressed mentation - all sedation and agitation meds reduce by 50%, fentanyl patch removed - now more alert and awake Augmentin for sinusitis/tracheitis  S/P bronch with  BAL on 11/27 for thick purulent secretions. Cultures negative.  Case was discussed with oncology, the patient is being planned to start her second course of chemotherapy. She did have some apparent response in terms of treating of the tumor. After the first course, however, weaning her has remained problematic. In addition, her courses now, complicated by progressive debility and deconditioning, as well as encephalopathy. Marda Stalker, M.D.   Critical Care Attestation.  I have personally  obtained a history, examined the patient, evaluated laboratory and imaging results, formulated the assessment and plan and placed orders. Case discussed with critical care RN, unit manager, respiratory therapy, nutrition, ICU pharmacist. The Patient requires high complexity decision making for assessment and support, frequent evaluation and titration of therapies, application of advanced monitoring technologies and extensive interpretation of multiple databases. The patient has critical illness that could lead imminently to failure of 1 or more organ systems and requires the highest level of physician preparedness to intervene.  Critical Care Time devoted to patient care services described in this note is 35 minutes and is exclusive of time spent in procedures.

## 2015-06-24 NOTE — Progress Notes (Signed)
Physical Therapy Treatment Patient Details Name: Emily Pratt MRN: [id number] DOB: June 13, 1946 Today's Date: 06/24/2015    History of Present Illness presented to ER with SOB, requiring BiPAP upon arrival; admitted with acute on chronic respiratory failure with hypercapnia, requiring intubation 10/25 (and subsequent failure to wean).  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  Has now completed once course of chemo (10/31) during hospitalization.  Status post trach placement; unable to wean from vent post-op.  Scheduled for additional round of chemo 11/29.    PT Comments    Patient remains severely debilitated with limited ability to actively assist with supine therex or bed mobility this date.  Extensive +2 assist required for all bed mobility (rolling, repositioning).  Vitals stable and WFL throughout session; remains vent dep via trach. Nursing at bedside end of session for completion of hygiene and dressing change after incontinent BM.   Follow Up Recommendations  SNF;LTACH     Equipment Recommendations       Recommendations for Other Services       Precautions / Restrictions Precautions Precautions: Fall Precaution Comments: NPO, trach, vent dep Restrictions Weight Bearing Restrictions: No    Mobility  Bed Mobility Overal bed mobility: Needs Assistance;+2 for physical assistance;+ 2 for safety/equipment Bed Mobility: Rolling Rolling: +2 for physical assistance;Total assist;+2 for safety/equipment         General bed mobility comments: assist for functional movement/placement of all extremities for attempts at assist with rolling (for management of incontinent BM)  Transfers                 General transfer comment: deferred secondary to ventilatory status  Ambulation/Gait             General Gait Details: deferred secondary to ventilatory status   Stairs            Wheelchair Mobility     Modified Rankin (Stroke Patients Only)       Balance                                    Cognition Arousal/Alertness: Awake/alert Behavior During Therapy: WFL for tasks assessed/performed                        Exercises Other Exercises Other Exercises: Supine LE therex, 1x10, passive vs. act assist ROM as able: ankle pumps, SAQs, hip abduct/adduct and heel slides.  PAtient with severe deconditioning; limited ability to actively assist with supine activities.    General Comments        Pertinent Vitals/Pain      Home Living                      Prior Function            PT Goals (current goals can now be found in the care plan section) Acute Rehab PT Goals Patient Stated Goal: unable to verbalize  PT Goal Formulation: With patient Time For Goal Achievement: 07/05/15 Potential to Achieve Goals: Fair Progress towards PT goals: Progressing toward goals    Frequency  Min 2X/week    PT Plan Current plan remains appropriate    Co-evaluation             End of Session Equipment Utilized During Treatment: Gait belt Activity Tolerance: Patient tolerated treatment well Patient left: in bed;with  nursing/sitter in room (nursing at bedside to complete hygiene and dressing change)     Time: 4834-7583 PT Time Calculation (min) (ACUTE ONLY): 24 min  Charges:  $Therapeutic Exercise: 8-22 mins $Therapeutic Activity: 8-22 mins                    G Codes:      Elizjah Noblet H. Owens Shark, PT, DPT, NCS 06/24/2015, 4:25 PM [phone]

## 2015-06-24 NOTE — Consult Note (Signed)
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   Measurement: 3.2 cm x 2 cm x 0.2 cm (wound bed is 75% adherent slough) Wound bed:75% adherent yellow slough Patient is experiencing loose stools and fecal management system is in place.  Drainage (amount, consistency, odor) Minimal serosanguinous drainage.  No odor.  Erythema  Moisture Associated Skin Damage to sacrum, resolving.  Periwound:Blanchable erythema Dressing procedure/placement/frequency:Cleanse wound to left buttocks with NS and pat dry.  Apply Santyl to wound bed.  Cover with NS moist dressing.  Secure with ABD pad/tape.  Change daily.   Continue with barrier cream as needed to maceration.  Prevalon boots as ordered.  Will not follow at this time.  Please re-consult if needed.  Domenic Moras RN BSN [place] Pager [phone]

## 2015-06-24 NOTE — Progress Notes (Signed)
Pt completed chemo and tol well. vss.

## 2015-06-24 NOTE — Plan of Care (Signed)
Problem: ICU Phase Progression Outcomes Goal: O2 sats trending toward baseline Outcome: Not Progressing Pt still on vent. Failed SBT this am Goal: Voiding-avoid urinary catheter unless indicated Outcome: Not Progressing Urinary catheter still in place. Pt needs for urinary retention.

## 2015-06-25 DIAGNOSIS — R5381 Other malaise: Secondary | ICD-10-CM | POA: Insufficient documentation

## 2015-06-25 LAB — C DIFFICILE QUICK SCREEN W PCR REFLEX
C DIFFICILE (CDIFF) TOXIN: NEGATIVE
C Diff antigen: NEGATIVE
C Diff interpretation: NEGATIVE

## 2015-06-25 LAB — CBC
HEMATOCRIT: 22.9 % — AB (ref 35.0–47.0)
HEMOGLOBIN: 7.2 g/dL — AB (ref 12.0–16.0)
MCH: 29.2 pg (ref 26.0–34.0)
MCHC: 31.3 g/dL — ABNORMAL LOW (ref 32.0–36.0)
MCV: 93.2 fL (ref 80.0–100.0)
Platelets: 225 10*3/uL (ref 150–440)
RBC: 2.46 MIL/uL — AB (ref 3.80–5.20)
RDW: 19.3 % — AB (ref 11.5–14.5)
WBC: 4.9 10*3/uL (ref 3.6–11.0)

## 2015-06-25 LAB — GLUCOSE, CAPILLARY
Glucose-Capillary: 125 mg/dL — ABNORMAL HIGH (ref 65–99)
Glucose-Capillary: 165 mg/dL — ABNORMAL HIGH (ref 65–99)
Glucose-Capillary: 169 mg/dL — ABNORMAL HIGH (ref 65–99)
Glucose-Capillary: 181 mg/dL — ABNORMAL HIGH (ref 65–99)
Glucose-Capillary: 186 mg/dL — ABNORMAL HIGH (ref 65–99)
Glucose-Capillary: 234 mg/dL — ABNORMAL HIGH (ref 65–99)

## 2015-06-25 LAB — BLOOD GAS, ARTERIAL
ACID-BASE EXCESS: 2.1 mmol/L (ref 0.0–3.0)
Allens test (pass/fail): POSITIVE — AB
Bicarbonate: 27.7 mEq/L (ref 21.0–28.0)
FIO2: 0.3
MECHVT: 500 mL
Mechanical Rate: 15
O2 SAT: 88.8 %
PATIENT TEMPERATURE: 37
PCO2 ART: 48 mmHg (ref 32.0–48.0)
PEEP: 5 cmH2O
PH ART: 7.37 (ref 7.350–7.450)
PO2 ART: 58 mmHg — AB (ref 83.0–108.0)

## 2015-06-25 LAB — BASIC METABOLIC PANEL
ANION GAP: 7 (ref 5–15)
BUN: 48 mg/dL — ABNORMAL HIGH (ref 6–20)
CHLORIDE: 115 mmol/L — AB (ref 101–111)
CO2: 28 mmol/L (ref 22–32)
Calcium: 8.3 mg/dL — ABNORMAL LOW (ref 8.9–10.3)
Creatinine, Ser: 0.63 mg/dL (ref 0.44–1.00)
Glucose, Bld: 194 mg/dL — ABNORMAL HIGH (ref 65–99)
POTASSIUM: 4.4 mmol/L (ref 3.5–5.1)
SODIUM: 150 mmol/L — AB (ref 135–145)

## 2015-06-25 MED ORDER — HOT PACK MISC ONCOLOGY
1.0000 | Freq: Once | Status: AC | PRN
  Filled 2015-06-25: qty 1

## 2015-06-25 MED ORDER — HEPARIN SOD (PORK) LOCK FLUSH 100 UNIT/ML IV SOLN
500.0000 [IU] | Freq: Once | INTRAVENOUS | Status: DC | PRN
Start: 2015-06-25 — End: 2015-07-16
  Filled 2015-06-25: qty 5

## 2015-06-25 MED ORDER — STERILE WATER FOR INJECTION IJ SOLN
INTRAMUSCULAR | Status: AC
  Administered 2015-06-25: 4.4 mL
  Filled 2015-06-25: qty 10

## 2015-06-25 MED ORDER — SODIUM CHLORIDE 0.9 % IV SOLN
80.0000 mg/m2 | Freq: Once | INTRAVENOUS | Status: AC
  Administered 2015-06-25: 180 mg via INTRAVENOUS
  Filled 2015-06-25 (×2): qty 9

## 2015-06-25 MED ORDER — DEXAMETHASONE SODIUM PHOSPHATE 100 MG/10ML IJ SOLN
10.0000 mg | Freq: Once | INTRAMUSCULAR | Status: AC
  Administered 2015-06-25: 10 mg via INTRAVENOUS
  Filled 2015-06-25: qty 1

## 2015-06-25 MED ORDER — FLUCONAZOLE 10 MG/ML PO SUSR
100.0000 mg | Freq: Every day | ORAL | Status: AC
  Administered 2015-06-25 – 2015-06-27 (×3): 100 mg
  Filled 2015-06-25 (×3): qty 10

## 2015-06-25 MED ORDER — FREE WATER
300.0000 mL | Freq: Four times a day (QID) | Status: DC
Start: 2015-06-25 — End: 2015-06-28
  Administered 2015-06-25 – 2015-06-28 (×12): 300 mL

## 2015-06-25 MED ORDER — HEPARIN SOD (PORK) LOCK FLUSH 100 UNIT/ML IV SOLN
250.0000 [IU] | Freq: Once | INTRAVENOUS | Status: AC | PRN
  Administered 2015-06-25: 250 [IU]
  Filled 2015-06-25: qty 3

## 2015-06-25 MED ORDER — ALTEPLASE 2 MG IJ SOLR
2.0000 mg | Freq: Once | INTRAMUSCULAR | Status: AC | PRN
  Administered 2015-06-25: 2 mg
  Filled 2015-06-25: qty 2

## 2015-06-25 MED ORDER — SODIUM CHLORIDE 0.9 % IJ SOLN: 3.0000 mL | INTRAMUSCULAR | Status: DC | PRN

## 2015-06-25 MED ORDER — SODIUM CHLORIDE 0.9 % IV SOLN
Freq: Once | INTRAVENOUS | Status: AC
  Administered 2015-06-25: 16:00:00 via INTRAVENOUS

## 2015-06-25 MED ORDER — SODIUM CHLORIDE 0.9 % IJ SOLN: 10.0000 mL | INTRAMUSCULAR | Status: DC | PRN

## 2015-06-25 NOTE — Consult Note (Signed)
Electrolyte CONSULT NOTE - FOLLOW UP  Pharmacy Consult for Electrolyte monitoring and replacement  Patient Measurements: Height: '5\' 7"'$  (170.2 cm) Weight: 230 lb 9.6 oz (104.6 kg) IBW/kg (Calculated) : 61.6  Vital Signs: Temp: 99.5 F (37.5 C) (11/30 0500) Temp Source: Oral (11/30 0500) BP: 130/69 mmHg (11/30 0500) Pulse Rate: 98 (11/30 0500)    Recent Labs  06/24/15 0500 06/25/15 0500  NA 150* 150*  K 3.4* 4.4  CL 113* 115*  CO2 30 28  GLUCOSE 139* 194*  BUN 40* 48*  CREATININE 0.81 0.63  CALCIUM 8.3* 8.3*  MG 1.9  --    Estimated Creatinine Clearance: 84.9 mL/min (by C-G formula based on Cr of 0.63).    Recent Labs  06/24/15 1947 06/25/15 0019 06/25/15 0439  GLUCAP 154* 234* 181*     Assessment: 69 yo female with SCLC current;y intubated in ICU. Pharmacy consulted for monitoring and managing electrolytes.   K:3.4  Plan:  Electrolytes are wnl except potassium. Will replace with 35mq per tube.  Pharmacy will continue to monitor electrolytes daily and replace as needed.  11/30 AM electrolytes WNL or elevated. No indication for replacement. F/u BMP in AM.   MSim Boast PharmD, BCPS  06/25/2015

## 2015-06-25 NOTE — Progress Notes (Signed)
* [facility]     Assessment and Plan:  Prolonged VDRF -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, chemotherapy; Significant improvement in airway patency by bronchoscopy 11/22. -Continued ventilator dependence, status post tracheostomy.  Right lung, small cell lung cancer. -The patient did have some response with opening of the right mainstem airway. With the first course of chemotherapy. We have since started the second course of chemotherapy. However, her courses been, complicated by severe debility, deconditioning, severe mucus plugging and overall continued general decline in her status. -After this course of chemotherapy. We will need to reevaluate the patient and see if she may be a candidate to go to long-term acute care facility, and potentially receive more chemotherapy. Or change the patient to comfort measures only.  COPD  -Cont nebulized steroids Cont nebulized bronchodilators.  PSVT  -Cont scheduled enteral metoprolol  Anemia  - improving  Thrombocytopenia  - improving  Agitated delirium  Volume overload Large TF residuals - resolved  Sinusitis -Currently on a short course of Augmentin.  ?Paraneoplastic Syndrome  -Has severe weakness ? [name].  Thrush -Currently on a 3 day course of Diflucan.     Monitor BMET intermittently, check CE Monitor I/Os DVT px: SCDs Monitor CBC intermittently Transfuse per usual guidelines   Date: 06/25/2015  MRN# [id number] AINSLEY DEAKINS 12-Jun-1946   Veena Sturgess Stolarz is a 69 y.o. old female seen in follow up for chief complaint of  Chief Complaint  Patient presents with  . Respiratory Distress     HPI/ROS: The patient is currently intubated and sedated and cannot provide history or review of systems.   Allergies:  Atorvastatin and Rosuvastatin    Physical Examination:   VS: BP 153/82 mmHg  Pulse 107  Temp(Src) 98.4 F (36.9 C) (Oral)  Resp 25  Ht '5\' 7"'$  (1.702 m)  Wt  104.6 kg (230 lb 9.6 oz)  BMI 36.11 kg/m2  SpO2 97%  General Appearance: No distress  Neuro:without focal findings,  speech normal,  HEENT: PERRLA, EOM intact. Pulmonary: normal breath sounds, No wheezing.   CardiovascularNormal S1,S2.  No m/r/g.   Abdomen: Benign, Soft, non-tender. Renal:  No costovertebral tenderness  GU:  Not performed at this time. Endoc: No evident thyromegaly, no signs of acromegaly. Skin:   warm, no rash. Extremities: normal, no cyanosis, clubbing.   LABORATORY PANEL:   CBC  Recent Labs Lab 06/25/15 0500  WBC 4.9  HGB 7.2*  HCT 22.9*  PLT 225   ------------------------------------------------------------------------------------------------------------------  Chemistries   Recent Labs Lab 06/24/15 0500 06/25/15 0500  NA 150* 150*  K 3.4* 4.4  CL 113* 115*  CO2 30 28  GLUCOSE 139* 194*  BUN 40* 48*  CREATININE 0.81 0.63  CALCIUM 8.3* 8.3*  MG 1.9  --    ------------------------------------------------------------------------------------------------------------------  Cardiac Enzymes  Recent Labs Lab 06/20/15 2300  TROPONINI 0.04*   ------------------------------------------------------------  RADIOLOGY:   No results found for this or any previous visit. No results found for this or any previous visit. ------------------------------------------------------------------------------------------------------------------  Thank  you for allowing [facility] to assist in the care of your patient. Our recommendations are noted above.  Please contact us if we can be of further service.   Marda Stalker, MD.  [facility] Office Number: [phone]  Patricia Pesa, M.D.  Vilinda Boehringer, M.D.  Merton Border, M.D  [place].  I have personally obtained a history, examined the patient,  evaluated laboratory and imaging results, formulated the assessment and plan and placed  orders. The case was discussed in detail with the critical care RN, critical care pharmacist, nutrition, case manager, unit manager, and respiratory therapist. The Patient requires high complexity decision making for assessment and support, frequent evaluation and titration of therapies, application of advanced monitoring technologies and extensive interpretation of multiple databases. The patient has critical illness that could lead imminently to failure of 1 or more organ systems and requires the highest level of physician preparedness to intervene.  Critical Care Time devoted to patient care services described in this note is 35 minutes and is exclusive of time spent in procedures.

## 2015-06-25 NOTE — Progress Notes (Signed)
[place]  [phone] [phone]  ID: Emily Pratt OB: 1946/01/26  MR#: [id number]  YBO#:[id number]  Patient Care Team: Dagoberto Ligas, MD as PCP - General (Internal Medicine)  CHIEF COMPLAINT:  Chief Complaint  Patient presents with  . Respiratory Distress    INTERVAL HISTORY: Patient now has tracheostomy, but remains on a ventilator. She is more lethargic and difficult to arouse today. No family at bedside. Patient is receiving cycle 2, day 2 of chemotherapy today which is etoposide only.    REVIEW OF SYSTEMS:   Review of Systems  Unable to perform ROS: intubated    PAST MEDICAL HISTORY: Past Medical History  Diagnosis Date  . Anxiety   . Arthritis   . COPD (chronic obstructive pulmonary disease) ([place])   . CHF (congestive heart failure) ([place])   . Hyperlipidemia   . Hypertension   . Diabetes mellitus without complication ([place])   . Chronic kidney disease   . Neuromuscular disorder ([place])     PAST SURGICAL HISTORY: Past Surgical History  Procedure Laterality Date  . Joint replacement Bilateral 2006    both knees  . Ankle surgery    . Tracheostomy tube placement N/A 06/08/2015    Procedure: TRACHEOSTOMY;  Surgeon: Margaretha Sheffield, MD;  Location: [facility] ORS;  Service: ENT;  Laterality: N/A;    FAMILY HISTORY Family History  Problem Relation Age of Onset  . Diabetes Mother   . Cancer Father   . Diabetes Sister   . Stroke Sister        ADVANCED DIRECTIVES:    HEALTH MAINTENANCE: Social History  Substance Use Topics  . Smoking status: Current Every Day Smoker    Types: Cigarettes  . Smokeless tobacco: None     Comment: 0.5 pack /day  . Alcohol Use: No     Colonoscopy:  PAP:  Bone density:  Lipid panel:  Allergies  Allergen Reactions  . Atorvastatin Other (See Comments)    Does not remember why she had Intolerance to Lipitor.  . Rosuvastatin Anxiety    Chest tigtness and feeling as if she had heartburn.     Current Facility-Administered Medications  Medication Dose Route Frequency Provider Last Rate Last Dose  . 0.9 %  sodium chloride infusion   Intravenous Once Lloyd Huger, MD      . acetaminophen (TYLENOL) tablet 650 mg  650 mg Oral Q6H PRN Harrie Foreman, MD   650 mg at 06/24/15 0931   Or  . acetaminophen (TYLENOL) suppository 650 mg  650 mg Rectal Q6H PRN Harrie Foreman, MD      . acetylcysteine (MUCOMYST) 20 % nebulizer / oral solution 3 mL  3 mL Nebulization Q6H Laverle Hobby, MD   4 mL at 06/25/15 0810  . alteplase (CATHFLO ACTIVASE) injection 2 mg  2 mg Intracatheter Once PRN Lloyd Huger, MD      . alteplase (CATHFLO ACTIVASE) injection 2 mg  2 mg Intracatheter Once PRN Lloyd Huger, MD      . amoxicillin-clavulanate (AUGMENTIN) 400-57 MG/5ML suspension 500 mg  500 mg Oral Q12H Laverle Hobby, MD   500 mg at 06/25/15 1050  . antiseptic oral rinse solution (CORINZ)  7 mL Mouth Rinse QID Laverle Hobby, MD   7 mL at 06/25/15 0440  . bisacodyl (DULCOLAX) suppository 10 mg  10 mg Rectal Daily PRN Wilhelmina Mcardle, MD      . budesonide (PULMICORT) nebulizer solution 0.5 mg  0.5 mg Nebulization BID  Flora Lipps, MD   0.5 mg at 06/25/15 0809  . chlorhexidine gluconate (PERIDEX) 0.12 % solution 15 mL  15 mL Mouth Rinse BID Flora Lipps, MD   15 mL at 06/25/15 0826  . clonazePAM (KLONOPIN) tablet 0.5 mg  0.5 mg Per Tube BID Vishal Mungal, MD   0.5 mg at 06/25/15 1056  . collagenase (SANTYL) ointment   Topical Daily Wilhelmina Mcardle, MD      . dexamethasone (DECADRON) 10 mg in sodium chloride 0.9 % 50 mL IVPB  10 mg Intravenous Once Lloyd Huger, MD      . etoposide (VEPESID) 180 mg in sodium chloride 0.9 % 500 mL chemo infusion  80 mg/m2 (Treatment Plan Actual) Intravenous Once Lloyd Huger, MD      . famotidine (PEPCID) tablet 20 mg  20 mg Per Tube BID Wilhelmina Mcardle, MD   20 mg at 06/25/15 1050  . feeding supplement (PRO-STAT SUGAR FREE 64)  liquid 30 mL  30 mL Per Tube TID Laverle Hobby, MD   30 mL at 06/25/15 1050  . feeding supplement (VITAL HIGH PROTEIN) liquid 1,000 mL  1,000 mL Per Tube Continuous Laverle Hobby, MD 50 mL/hr at 06/24/15 1650 1,000 mL at 06/24/15 1650  . fentaNYL (SUBLIMAZE) injection 25-100 mcg  25-100 mcg Intravenous Q2H PRN Wilhelmina Mcardle, MD   50 mcg at 06/24/15 0931  . fluconazole (DIFLUCAN) 10 MG/ML suspension 100 mg  100 mg Per Tube Daily Wilhelmina Mcardle, MD   100 mg at 06/25/15 1056  . free water 300 mL  300 mL Per Tube 4 times per day Laverle Hobby, MD      . heparin lock flush 100 unit/mL  500 Units Intracatheter Once PRN Lloyd Huger, MD      . heparin lock flush 100 unit/mL  250 Units Intracatheter Once PRN Lloyd Huger, MD      . heparin lock flush 100 unit/mL  500 Units Intracatheter Once PRN Lloyd Huger, MD      . heparin lock flush 100 unit/mL  250 Units Intracatheter Once PRN Lloyd Huger, MD      . Hot Pack 1 packet  1 packet Topical Once PRN Lloyd Huger, MD      . hydrALAZINE (APRESOLINE) injection 10-20 mg  10-20 mg Intravenous Q4H PRN Wilhelmina Mcardle, MD      . insulin aspart (novoLOG) injection 0-20 Units  0-20 Units Subcutaneous 6 times per day Wilhelmina Mcardle, MD   4 Units at 06/25/15 [phone]  . insulin glargine (LANTUS) injection 20 Units  20 Units Subcutaneous Daily Wilhelmina Mcardle, MD   20 Units at 06/25/15 1055  . levalbuterol (XOPENEX) nebulizer solution 0.63 mg  0.63 mg Nebulization Q3H PRN Wilhelmina Mcardle, MD      . levalbuterol [facility]) nebulizer solution 0.63 mg  0.63 mg Nebulization Q6H Wilhelmina Mcardle, MD   0.63 mg at 06/25/15 0810  . losartan (COZAAR) tablet 50 mg  50 mg Oral Daily Laverle Hobby, MD   50 mg at 06/25/15 1055  . metoCLOPramide (REGLAN) injection 5 mg  5 mg Intravenous Q4H PRN Praveen Mannam, MD   5 mg at 06/19/15 0143  . metoprolol (LOPRESSOR) injection 2.5-5 mg  2.5-5 mg Intravenous Q3H PRN Wilhelmina Mcardle,  MD   2.5 mg at 06/23/15 0957  . metoprolol (LOPRESSOR) tablet 100 mg  100 mg Per Tube BID Wilhelmina Mcardle, MD   100 mg  at 06/25/15 1056  . midazolam (VERSED) injection 1 mg  1 mg Intravenous Q2H PRN Vilinda Boehringer, MD   1 mg at 06/21/15 2353  . phenol (CHLORASEPTIC) mouth spray 1 spray  1 spray Mouth/Throat PRN Wilhelmina Mcardle, MD      . polyethylene glycol (MIRALAX / GLYCOLAX) packet 17 g  17 g Oral Daily PRN Laverle Hobby, MD      . Derrill Memo ON 06/26/2015] senna-docusate (Senokot-S) tablet 1 tablet  1 tablet Per Tube QODAY Laverle Hobby, MD      . sodium chloride 0.9 % injection 10 mL  10 mL Intracatheter PRN Lloyd Huger, MD      . sodium chloride 0.9 % injection 10 mL  10 mL Intracatheter PRN Lloyd Huger, MD      . sodium chloride 0.9 % injection 3 mL  3 mL Intravenous PRN Lloyd Huger, MD      . sodium chloride 0.9 % injection 3 mL  3 mL Intravenous PRN Lloyd Huger, MD        OBJECTIVE: Filed Vitals:   06/25/15 1055 06/25/15 1100  BP: 154/81 153/82  Pulse: 105 107  Temp:    Resp:  25     Body mass index is 36.11 kg/(m^2).    ECOG FS:4 - Bedbound  General:  intubated Eyes: Pink conjunctiva, anicteric sclera. HEENT:  Trach tube in place. Lungs: Clear to auscultation bilaterally. Heart: Regular rate and rhythm. No rubs, murmurs, or gallops. Abdomen: Soft, nontender, nondistended. No organomegaly noted, normoactive bowel sounds. Musculoskeletal: No edema, cyanosis, or clubbing. Neuro: Alert. Skin: No rashes or petechiae noted.   LAB RESULTS:  Lab Results  Component Value Date   NA 150* 06/25/2015   K 4.4 06/25/2015   CL 115* 06/25/2015   CO2 28 06/25/2015   GLUCOSE 194* 06/25/2015   BUN 48* 06/25/2015   CREATININE 0.63 06/25/2015   CALCIUM 8.3* 06/25/2015   PROT 4.8* 06/08/2015   ALBUMIN 2.1* 06/08/2015   AST 22 06/08/2015   ALT 45 06/08/2015   ALKPHOS 113 06/08/2015   BILITOT 1.0 06/08/2015   GFRNONAA >60 06/25/2015   GFRAA >60  06/25/2015    Lab Results  Component Value Date   WBC 4.9 06/25/2015   NEUTROABS 8.0* 06/21/2015   HGB 7.2* 06/25/2015   HCT 22.9* 06/25/2015   MCV 93.2 06/25/2015   PLT 225 06/25/2015     STUDIES: Dg Chest 1 View  06/24/2015  CLINICAL DATA:  Acute respiratory failure, right pleural effusion, history of COPD, CHF, diabetes, chronic renal insufficiency, and current smoker. EXAM: CHEST 1 VIEW COMPARISON:  Portable chest x-ray of June 22, 2015 FINDINGS: The left lung is well-expanded. There is stable left lower lobe atelectasis with obscuration of the hemidiaphragm. On the right diffusely increased interstitial and alveolar opacities persist. The hemidiaphragm remains obscured. Cardiac silhouette is enlarged but its right border is indistinct. The pulmonary vascularity is mildly prominent though stable. A tracheostomy appliance tube has its tip at the level of the inferior margin of the clavicular heads which lies 6.4 cm above the carina. The esophagogastric tube tip projects below the inferior margin of the image. The right-sided PICC line tip projects over the junction of the proximal and midportions of the SVC. IMPRESSION: Slight interval deterioration in the appearance of the left lower lobe. Persistent widespread interstitial and alveolar opacities on the right consistent with pneumonia or other alveolar filling process. There is no pneumothorax. Layering of pleural fluid posteriorly on the  right is suspected. Electronically Signed   By: David  Martinique M.D.   On: 06/24/2015 07:42   Dg Chest 1 View  06/14/2015  CLINICAL DATA:  Tracheostomy tube placement EXAM: CHEST 1 VIEW COMPARISON:  06/15/2015 FINDINGS: Tracheostomy tube as been placed. The tip is 7.6 cm from the carina. Large right pleural effusion and associated pulmonary opacity are stable. Opacity at the left base is stable. Borderline cardiomegaly. No pneumothorax. Right upper extremity PICC is stable. IMPRESSION: Tracheostomy tube  as been placed as described Stable large right pleural effusion and bilateral pulmonary opacity. Electronically Signed   By: Marybelle Killings M.D.   On: 05/30/2015 19:18   Dg Chest 1 View  06/15/2015  CLINICAL DATA:  Dyspnea. History of COPD, congestive heart failure, hypertension, chronic kidney disease. EXAM: CHEST 1 VIEW COMPARISON:  06/12/2015 FINDINGS: Endotracheal tube, enteric catheter, right PICC line terminating in the expected location of proximal superior vena cava are stable. Cardiomediastinal silhouette is borderline enlarged. Mediastinal contours appear intact. The ureter demonstrates atherosclerotic calcifications of the arch. There is no evidence of pneumothorax. There is slight improvement of the aeration of the right lung with large right pleural effusion. Left lower lobe atelectasis is noted. Osseous structures are without acute abnormality. Soft tissues are grossly normal. IMPRESSION: Slight improvement of the aeration of the right lung, with persistent large right pleural effusion, an diffuse haziness of the visualized lung parenchyma. Atelectasis of the left lower lobe. Electronically Signed   By: Fidela Salisbury M.D.   On: 06/15/2015 09:12   Dg Chest 1 View  05/31/2015  CLINICAL DATA:  Dyspnea. EXAM: CHEST 1 VIEW COMPARISON:  05/30/2015 FINDINGS: ET tube tip is above the carina. There is a nasogastric tube in place. Right arm PICC line tip is in the SVC. Loculated right pleural effusion is unchanged from previous exam. Cardiac enlargement and aortic atherosclerosis again noted. IMPRESSION: 1. No change and large loculated right pleural effusion. 2. Cardiac enlargement and aortic atherosclerosis. Electronically Signed   By: Kerby Moors M.D.   On: 05/31/2015 09:31   Dg Chest 1 View  05/30/2015  CLINICAL DATA:  Shortness of breath. EXAM: CHEST 1 VIEW COMPARISON:  05/29/2015.  CT 05/20/2015 . FINDINGS: Endotracheal tube, NG tube, right PICC line in stable position. Persistent  consolidation of the right lung and right pleural effusion again noted . Heart size stable. No pneumothorax. No acute osseous abnormality. IMPRESSION: 1. Lines and tubes in stable position. 2. Persistent consolidation of the right lung with prominent right pleural effusion. No interim change from prior exam. Electronically Signed   By: Marcello Moores  Register   On: 05/30/2015 07:15   Dg Chest 1 View  05/29/2015  CLINICAL DATA:  Dyspnea, respiratory failure, COPD, lung mass EXAM: CHEST 1 VIEW COMPARISON:  Portable chest x-ray of May 28, 2015 FINDINGS: There is persistent near total atelectasis of the right lung with some aeration noted in the lower hemi thorax. The left lung is clear. There is no significant mediastinal shift. The cardiac silhouette is normal in size. The endotracheal tube tip lies 4.6 cm above the carina. The esophagogastric tube tip projects below the inferior margin of the image. The right-sided PICC line tip projects over the midportion of the SVC. IMPRESSION: Persistent consolidation of the mid and upper right lung with small amount of residual aerated right lower lung. The left lung is clear. The support tubes are in reasonable position. Electronically Signed   By: David  Martinique M.D.   On: 05/29/2015 07:20  Dg Chest 1 View  05/28/2015  CLINICAL DATA:  Hypoxia EXAM: CHEST 1 VIEW COMPARISON:  May 26, 2015 FINDINGS: Endotracheal tube tip is 4.6 cm above the carina. Nasogastric tube tip and side port below the diaphragm. Central catheter tip is in the superior cava. No pneumothorax. There is complete opacification of the right hemithorax with volume loss and probable effusion. There is elevation of the right hemidiaphragm. The left lung is clear. The heart size is within normal limits. Pulmonary vascularity on the left is within normal limits. Pulmonary vascularity on the right is obscured. There is atherosclerotic change throughout the aorta. IMPRESSION: Tube and catheter positions as  described without pneumothorax. Complete opacification of the right hemithorax remains with evidence of volume loss and probable pleural effusion. Left lung clear. No change in cardiac silhouette. Electronically Signed   By: Lowella Grip III M.D.   On: 05/28/2015 07:18   Dg Chest 1 View  05/26/2015  CLINICAL DATA:  Wheezing and shortness of breath EXAM: CHEST 1 VIEW COMPARISON:  Portable chest x-ray of today's date FINDINGS: There is persistent 0 opacification of the mid and upper thirds of the right hemi thorax. Only a small amount of aerated lung is visible in the lower hemi thorax. Pleural fluid on the right is suspected. The cardiac silhouette is mildly enlarged. The left lung is well-expanded. There is no focal infiltrate or pleural effusion on the left. The endotracheal tube tip projects 4.2 cm above the carina. The PICC line tip on the left overlies the junction of the proximal and midportions of the SVC. IMPRESSION: Fairly stable appearance of the chest since the previous study with opacification of the upper 2/3 of the right hemithorax consistent with atelectasis-pneumonia and left pleural effusion. The left lung remains clear. Electronically Signed   By: David  Martinique M.D.   On: 05/26/2015 13:51   Dg Abd 1 View  06/12/2015  CLINICAL DATA:  69 year old female status post nasogastric tube placement EXAM: ABDOMEN - 1 VIEW COMPARISON:  Prior abdominal radiograph 06/09/2015 FINDINGS: A nasogastric tube projects over the left lower quadrant in the region of the mid stomach (based on prior imaging). The bowel gas pattern is otherwise nonspecific. Opacification of the right lung base consistent with known large right-sided pleural effusion. No acute osseous abnormality. IMPRESSION: Well-positioned nasogastric tube overlying the gastric body. Electronically Signed   By: Jacqulynn Cadet M.D.   On: 06/20/2015 16:12   Dg Abd 1 View  06/06/2015  CLINICAL DATA:  69 year old female with abdominal  distention and diarrhea. Initial encounter. EXAM: ABDOMEN - 1 VIEW COMPARISON:  06/02/2015 and earlier. FINDINGS: 2 Portable AP supine views at 1525 hours. Large body habitus. Improved bowel gas pattern since 05/30/2015, although increased gas-filled small and large bowel loops since 06/02/2015. Still, no dilated small bowel loops present. Large bowel caliber is at the upper limits of normal. Enteric tube remains in place. The side hole appears to be at the level of the diaphragm. Stable visualized osseous structures. IMPRESSION: 1. Increased gas filled large and small bowel loops since 06/02/2015 most suggestive of ileus. 2. Enteric tube in place, but side hole at the level of the distal thoracic esophagus. Advance 6 cm to place the side hole within the stomach. Electronically Signed   By: Genevie Ann M.D.   On: 06/06/2015 15:41   Dg Abd 1 View  06/02/2015  CLINICAL DATA:  Status post OG tube placement. EXAM: ABDOMEN - 1 VIEW COMPARISON:  None. FINDINGS: OG tube is in  place with the tip in the stomach. The tube could be advanced 3-4 cm for better positioning. IMPRESSION: As above. Electronically Signed   By: Inge Rise M.D.   On: 06/02/2015 15:21   Dg Abd 1 View  06/02/2015  CLINICAL DATA:  Orogastric tube placement. EXAM: ABDOMEN - 1 VIEW COMPARISON:  Earlier today at 0800 hours. FINDINGS: Motion and patient body habitus degradation. Nasogastric tube terminates at the body of the stomach. The side-port may be above the gastroesophageal junction. No gross free intraperitoneal air. Partial right hemi thorax opacification, incompletely imaged. IMPRESSION: Degraded exam, as detailed above. Nasogastric terminating at the body of the stomach. Electronically Signed   By: Abigail Miyamoto M.D.   On: 06/02/2015 09:28   Dg Abd 1 View  06/02/2015  CLINICAL DATA:  NG tube placement . EXAM: ABDOMEN - 1 VIEW COMPARISON:  05/30/2015 . FINDINGS: NG tube tip noted at the level of the gastroesophageal junction. More distal  placement should be considered. Persistent bowel distention. No free air . IMPRESSION: NG tube tip noted at the level of the gastroesophageal junction. More distal placement should be considered.These results will be called to the ordering clinician or representative by the Radiologist Assistant, and communication documented in the PACS or zVision Dashboard. Electronically Signed   By: Marcello Moores  Register   On: 06/02/2015 08:19   Dg Abd 1 View  05/30/2015  CLINICAL DATA:  Lung cancer.  Distended abdomen. EXAM: ABDOMEN - 1 VIEW COMPARISON:  05/20/2015 FINDINGS: Mild gaseous distention of the stomach and transverse colon. No small bowel dilatation to suggest obstruction. No organomegaly. No free air. IMPRESSION: Mild gaseous distention of the stomach and transverse colon. This may reflect mild ileus. Electronically Signed   By: Rolm Baptise M.D.   On: 05/30/2015 16:42   Ct Head Wo Contrast  06/20/2015  CLINICAL DATA:  Lung cancer.  Diabetes.  CHF.  Oxygen desaturation. EXAM: CT HEAD WITHOUT CONTRAST TECHNIQUE: Contiguous axial images were obtained from the base of the skull through the vertex without intravenous contrast. COMPARISON:  None FINDINGS: Sinuses/Soft tissues: Intubation. Bilateral mastoid effusions. Paranasal sinuses clear. Intracranial: Moderate motion degradation throughout. Moderate low density in the periventricular white matter likely related to small vessel disease. Cerebral atrophy which is age advanced. Carotid and vertebral artery atherosclerosis. Given motion limitation, no mass lesion, hemorrhage, hydrocephalus, acute infarct, intra-axial, or extra-axial fluid collection. IMPRESSION: 1. Moderate motion degraded exam. 2.  Cerebral atrophy and small vessel ischemic change. 3. No definite acute intracranial abnormality. Low sensitivity for intracranial metastasis secondary to noncontrast technique and motion. 4. Bilateral mastoid effusions. Electronically Signed   By: Abigail Miyamoto M.D.   On:  06/20/2015 15:33   Ct Chest W Contrast  06/06/2015  CLINICAL DATA:  Followup chest radiograph. History of CHF, COPD and hypertension. EXAM: CT CHEST WITH CONTRAST TECHNIQUE: Multidetector CT imaging of the chest was performed during intravenous contrast administration. CONTRAST:  9m OMNIPAQUE IOHEXOL 300 MG/ML  SOLN COMPARISON:  05/20/2015 FINDINGS: Mediastinum: The heart size is normal. No pericardial effusion. There is an endotracheal tube with tip above the carina. The nasogastric tube is present with tip in the body of the stomach. The index right supraclavicular lymph node measures 1.5 cm, image 5 of series 2. Previously 2.6 cm. Right paratracheal lymph node measure 2.0 cm, image 19 of series 2. Previously 3.3 cm. Pre-vascular lymph node measures 1.6 cm, image 25 of series 2. Previously 2.3 cm. Lungs/Pleura: There is a moderate size partially loculated right pleural effusion  which has decreased in volume from previous exam. There has been interval improvement in aeration to the right upper lobe and superior segment of right lower lobe compatible with decrease in size of obstructing central right lung mass. Continued complete atelectasis and consolidation of the right middle lobe. The right lung mass is difficult to visualize and accurately remeasure. Mild atelectasis is noted within the posterior and medial left lower lobe. Upper Abdomen: Stable appearance of left adrenal nodule measuring 2.9 x 1.3 cm. The right adrenal gland is normal. Low to intermediate attenuation structure within the posterior right hepatic lobe measures 2.6 cm, image 57 of series 2. Unchanged from previous exam. There is mild perihepatic ascites noted. Musculoskeletal: Scoliosis deformity involves the thoracic spine. IMPRESSION: 1. Interval response to therapy. Since the previous exam there has been improved aeration to the right upper lobe and superior segment of the right lower lobe. Findings are compatible with decrease in size  of central obstructing right perihilar lesion 2. Decrease in size of mediastinal and right supraclavicular adenopathy. 3. No new or progressive disease identified. Electronically Signed   By: Kerby Moors M.D.   On: 06/06/2015 10:39   [place] Chest  06/02/2015  CLINICAL DATA:  Pleural effusion.  Lung cancer. EXAM: CHEST ULTRASOUND COMPARISON:  Chest x-ray 06/02/2015. FINDINGS: Moderate right-sided pleural effusion noted. IMPRESSION: Moderate right pleural effusion . Electronically Signed   By: Marcello Moores  Register   On: 06/02/2015 10:05   Dg Chest Port 1 View  06/22/2015  CLINICAL DATA:  69 year old female with history of respiratory failure. EXAM: PORTABLE CHEST 1 VIEW COMPARISON:  Chest x-ray 06/20/2015. FINDINGS: Tracheostomy tube in position with tip approximately 6.6 cm above the carina. A nasogastric tube is seen extending into the stomach, however, the tip of the nasogastric tube extends below the lower margin of the image. There is a right upper extremity PICC with tip terminating in the proximal superior vena cava. Lung volumes appear normal. Large right pleural effusion slightly decreased compared to the prior examination. Multifocal opacities throughout the right mid to lower lung and in the medial left lung base. No definite left pleural effusion. No evidence of pulmonary edema. Heart size appears borderline enlarged. The patient is rotated to the left on today's exam, resulting in distortion of the mediastinal contours and reduced diagnostic sensitivity and specificity for mediastinal pathology. Atherosclerosis in the thoracic aorta. IMPRESSION: 1. Support apparatus, as above. 2. Slight decreased size of large right pleural effusion. Extensive areas of atelectasis and/or consolidation throughout the right mid to lower lung, and additionally in the medial left lung base. 3. Atherosclerosis. Electronically Signed   By: Vinnie Langton M.D.   On: 06/22/2015 14:10   Dg Chest Port 1 View  06/20/2015   CLINICAL DATA:  Ventilator dependent respiratory failure. Hypoxia. Followup right pleural effusion and postobstructive atelectasis/pneumonia in the right lower lobe and right middle lobe. EXAM: PORTABLE CHEST 1 VIEW COMPARISON:  06/19/2015 and earlier, including CT chest 06/06/2015. FINDINGS: Endotracheal tube tip in satisfactory position projecting approximately 6 cm above the carina. Right arm PICC tip projects at the junction of the right innominate vein and SVC, unchanged. Nasogastric tube courses below the diaphragm into the stomach. Since the 1114 hr examination yesterday, no change in the dense airspace consolidation in the right middle lobe and right lower lobe and the large right pleural effusion. Prominent bronchovascular markings in the left lung, unchanged, with improvement in the pulmonary venous hypertension. No new pulmonary parenchymal abnormalities. IMPRESSION: 1. Support apparatus satisfactory.  2. No change since yesterday in the dense atelectasis/pneumonia involving the right middle lobe and right lower lobe. 3. No change in the large right pleural effusion. 4. No new abnormalities. Electronically Signed   By: Evangeline Dakin M.D.   On: 06/20/2015 08:31   Dg Chest Port 1 View  06/19/2015  CLINICAL DATA:  Respiratory failure. Small cell carcinoma the lung. Right pleural effusion. EXAM: PORTABLE CHEST 1 VIEW 11:13 a.m. COMPARISON:  Radiographs from 06/08/2015 through 06/19/2015 at 5:42 a.m. and CT scan of the chest 06/06/2015 FINDINGS: The large right pleural effusion has decreased since the prior exam. PICC tip in the upper superior vena cava. Tracheostomy tube in good position. NG tube tip below the diaphragm. Left lung is clear. Pulmonary vascularity is normal. Right mediastinal adenopathy is noted. IMPRESSION: Decreased large right pleural effusion.  No pneumothorax. Electronically Signed   By: Lorriane Shire M.D.   On: 06/19/2015 11:29   Dg Chest Port 1 View  06/19/2015  CLINICAL DATA:   Respiratory failure with increasing difficulty breathing. History of COPD, diabetes and lung cancer. EXAM: PORTABLE CHEST 1 VIEW COMPARISON:  Radiographs 06/18/2015 and 06/10/2015.  CT 06/06/2015. FINDINGS: 0954 hour. Right arm PICC has been slightly advanced to the level of the upper SVC. Tracheostomy and nasogastric tube appear unchanged. There is progressive volume loss and opacification in the right hemithorax consistent with progressive partial lung collapse. Underlying right pleural effusion appears unchanged. There is improved aeration of the left lung. The heart size appears unchanged. IMPRESSION: Progressive partial right lung collapse with increased pleural parenchymal opacities on the right hemithorax. Support system positioned as above. Electronically Signed   By: Richardean Sale M.D.   On: 06/19/2015 10:11   Dg Chest Port 1 View  06/18/2015  CLINICAL DATA:  Respiratory failure. EXAM: PORTABLE CHEST 1 VIEW COMPARISON:  06/24/2015. FINDINGS: Tracheostomy tube, NG tube, right PICC line stable position. PICC line tip projected over the right subclavian vein . Persistent cardiomegaly. Persistent bilateral pulmonary infiltrates particularly prominent on the right. Persistent bilateral pleural effusions. No interim improvement. No pneumothorax. IMPRESSION: 1. Lines and tubes in stable position. 2. Persistent cardiomegaly with unchanged bilateral pulmonary infiltrates, particular prominent on the right. Persistent bilateral pleural effusions. Findings are consistent with congestive heart failure with asymmetric pulmonary edema. Pneumonia, particularly on the right cannot be excluded. Electronically Signed   By: Marcello Moores  Register   On: 06/18/2015 07:53   Dg Chest Port 1 View  06/01/2015  CLINICAL DATA:  Respiratory failure. EXAM: PORTABLE CHEST 1 VIEW COMPARISON:  06/01/2015. FINDINGS: Tracheostomy tube, NG tube, right PICC line in stable position. Cardiomegaly with pulmonary vascular prominence and  bilateral pulmonary alveolar infiltrates particularly the right. Findings suggest congestive heart failure with asymmetric pulmonary edema. Pneumonia cannot be excluded. No pneumothorax. IMPRESSION: 1. Lines and tubes in stable position. 2. Cardiomegaly with persistent bilateral pulmonary infiltrates, particularly prominent on the right. Persistent right pleural effusion. Findings suggest congestive heart failure with asymmetric pulmonary edema. Pneumonia cannot be excluded. No interim change from prior exam . Electronically Signed   By: [place]   On: 06/19/2015 07:28   Dg Chest Port 1 View  06/12/2015  CLINICAL DATA:  Short of breath. EXAM: PORTABLE CHEST 1 VIEW COMPARISON:  06/09/2015 FINDINGS: Endotracheal tube remains in good position. Right arm PICC tip in the proximal SVC unchanged. NG tube in place with the tip not visualized Complete opacification of the right chest which has progressed since the prior study. This is likely due to  pleural effusion and collapse. Underlying pneumonia or tumor not excluded. Cardiac enlargement. Vascular congestion and on the left without pulmonary edema or effusion on the left. IMPRESSION: Endotracheal tube remains in good position Complete opacification of the right chest due to enlarging effusion and collapse of the right lung. Electronically Signed   By: Franchot Gallo M.D.   On: 06/12/2015 07:44   Dg Chest Port 1 View  06/09/2015  CLINICAL DATA:  Intubation and OG tube placement EXAM: PORTABLE CHEST 1 VIEW COMPARISON:  1 day prior FINDINGS: Endotracheal tube terminates 4.1 cm above carina.Nasogastric tube extends beyond the inferior aspect of the film. A right-sided PICC line is poorly visualized centrally but likely terminates at the low SVC. The patient is minimally rotated left. Cardiomegaly accentuated by AP portable technique. Layering right pleural effusion. No pneumothorax. Interstitial edema is asymmetric and worse on the right. Lower lobe  predominant right worse than left airspace disease is minimally improved. IMPRESSION: Minimal improvement in aeration since the exam of 1 day prior. Congestive heart failure with asymmetric interstitial and airspace opacities, worse on the right. Concurrent infection cannot be excluded. Layering right pleural effusion. Electronically Signed   By: Abigail Miyamoto M.D.   On: 06/09/2015 12:09   Dg Chest Port 1 View  06/08/2015  CLINICAL DATA:  Respiratory failure.  Followup exam. EXAM: PORTABLE CHEST 1 VIEW COMPARISON:  Chest CT and chest radiographs, 06/06/2015. FINDINGS: There is near complete opacification of the right hemi thorax consistent with combination of atelectasis/ consolidation and pleural fluid. There is, however, some more aeration of the mid to upper lung on the right than there was on the prior chest radiographs. Left lung is essentially clear.  No pneumothorax. Right-sided PICC is stable. Endotracheal tube and nasogastric tube have been removed. IMPRESSION: 1. Mild improvement from prior study with some more aeration evident of the right mid and upper lung. This is consistent with a decrease in lung consolidation/atelectasis. There is still significant opacification throughout most of the right hemi thorax. No new abnormalities. Electronically Signed   By: Lajean Manes M.D.   On: 06/08/2015 08:14   Dg Chest Port 1 View  06/06/2015  CLINICAL DATA:  Respiratory failure . EXAM: PORTABLE CHEST 1 VIEW COMPARISON:  06/02/2015.  CT 05/20/2015 . FINDINGS: Endotracheal tube 8 cm above the carina at the thoracic inlet. Slight interim advancement should be considered. NG tube and right PICC line in stable position. Progressive consolidation/ atelectasis of the right lung and progressive right pleural effusion. Right pleural effusion is large. Left lung is clear. Stable cardiomegaly. IMPRESSION: 1. Endotracheal tube 8 cm above the carina at the thoracic inlet. Slightly mid aspect should be considered. NG  tube and right PICC line stable position. 2. Progression of consolidation/atelectasis of the right lung with progression of the right pleural effusion. The right pleural effusion is large. 3. Stable cardiomegaly. 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 Unchanged position of endotracheal tube, terminating suitably above the carina, 4.5 cm. Unchanged gastric tube, terminating out of the field of view. Unchanged right upper extremity PICC, which terminates at the level the right mainstem bronchus. Overlying EKG leads and ventilator tubing. Atherosclerosis of  the aortic arch. Dense opacity of the right lung persists, with obscuration the right hemidiaphragm, right heart borders, and the majority of the right lung. Minimal maintained aeration at the right apex. Airspace opacity at the left base persists, with similar aeration to the prior. No pneumothorax. IMPRESSION: Similar appearance of dense right-sided airspace opacity, likely a combination of lung consolidation, atelectasis, tumor tissue, and pleural effusion. Unchanged position of support apparatus, as above. Airspace disease at the left base, similar to the comparison, likely combination of atelectasis, edema, consolidation. Small left pleural effusion not excluded. Atherosclerosis. Signed, Dulcy Fanny. Earleen Newport, DO [facility] Electronically Signed   By: Corrie Mckusick D.O.   On: 06/02/2015 08:23   Dg Abd Portable 1v  06/22/2015  CLINICAL DATA:  NG tube residuals.  Check placement EXAM: PORTABLE ABDOMEN - 1 VIEW COMPARISON:  Radiograph 06/10/2015  FINDINGS: NG tube extends into the stomach. The side port is below the GE junction. Tip is below the margin of film. Second film was provided which demonstrates tip within the gastric body. Bilateral basilar effusions and atelectasis. IMPRESSION: NG tube appears in proper position. Electronically Signed   By: Suzy Bouchard M.D.   On: 06/22/2015 09:29   Dg Abd Portable 1v  06/09/2015  CLINICAL DATA:  OG tube placement EXAM: PORTABLE ABDOMEN - 1 VIEW COMPARISON:  06/06/2015 FINDINGS: The nasogastric tube tip is in the body of stomach. Side port is well below the GE junction. Mild gaseous distension of large and small bowel loops unchanged. IMPRESSION: 1. NG tube tip is in the body of stomach. Electronically Signed   By: Kerby Moors M.D.   On: 06/09/2015 12:04    ASSESSMENT:  Small cell lung cancer.   PLAN:    1. Small cell lung cancer: Patient is likely stage IV given her suspicious adrenal lesion. Recent CT of her head on June 20, 2015 did not report any metastatic disease.  CT scan results from June 06, 2015 revealed a positive response to cycle 1 of treatment with decreased size of her mass. Her persistent requirement for mechanical ventilation may more likely be secondary to poor lung function, pleural effusion, and COPD than external compression of her mass at this point. Proceed with cycle 2, day 2 of cisplatin and etoposide. Etoposide only today and tomorrow.  She will ultimately require port placement once she is extubated. XRT would be helpful, but there are patient safety concerns transporting to radiation oncology.   2. Thrombocytopenia: Platelet count is now within normal limits. Monitor. 3. Anemia:  Likely secondary to chemotherapy. Continue to transfuse if hemoglobin falls below 7.0.  4. Tracheostomy:  Appreciate ENT input. Tracheostomy in place.  5. Disposition: Long-term ventilation and tracheostomy facilities have been discussed, but this poses a problem for transportation  back and forth for chemotherapy. Case previously discussed with case management as well as long-term facility representative.   Will follow.   Lloyd Huger, MD   06/25/2015 1:14 PM

## 2015-06-25 NOTE — Progress Notes (Signed)
Physical Therapy Treatment Patient Details Name: Emily Pratt MRN: [id number] DOB: [birth year] Today's Date: 06/25/2015    History of Present Illness presented to ER with SOB, requiring BiPAP upon arrival; admitted with acute on chronic respiratory failure with hypercapnia, requiring intubation 10/25 (and subsequent failure to wean).  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  Has now completed once course of chemo (10/31) during hospitalization.  Status post trach placement; unable to wean from vent post-op.  Scheduled for additional round of chemo 11/29.    PT Comments    Pt is making slow progress towards goals and continues to be weak with B LE worse than B UE. Pt able to tolerate increased there-ex this date, however still requires assistance to complete exercises.   Follow Up Recommendations  SNF;LTACH     Equipment Recommendations       Recommendations for Other Services       Precautions / Restrictions Precautions Precautions: Fall Precaution Comments: NPO, trach, vent dep Restrictions Weight Bearing Restrictions: No    Mobility  Bed Mobility               General bed mobility comments: not performed this date  Transfers                    Ambulation/Gait                 Stairs            Wheelchair Mobility    Modified Rankin (Stroke Patients Only)       Balance                                    Cognition Arousal/Alertness: Awake/alert Behavior During Therapy: WFL for tasks assessed/performed Overall Cognitive Status: Within Functional Limits for tasks assessed                      Exercises Other Exercises Other Exercises: supine ther-ex performed on B UE including shoulder flexion and elbow flexion/extension x 10 reps with min assist. Pt fatigues with increased reps. B LE ther-ex performed with mod/max assist for correct performance including SLRs,  hip abd/add, and knee flexion.    General Comments        Pertinent Vitals/Pain Pain Assessment: No/denies pain    Home Living                      Prior Function            PT Goals (current goals can now be found in the care plan section) Acute Rehab PT Goals Patient Stated Goal: unable to verbalize  PT Goal Formulation: With patient Time For Goal Achievement: 07/05/15 Potential to Achieve Goals: Fair Progress towards PT goals: Progressing toward goals    Frequency  Min 2X/week    PT Plan Current plan remains appropriate    Co-evaluation             End of Session Equipment Utilized During Treatment: Gait belt Activity Tolerance: Patient tolerated treatment well Patient left: in bed;with nursing/sitter in room     Time: 1516-1535 PT Time Calculation (min) (ACUTE ONLY): 19 min  Charges:  $Therapeutic Exercise: 8-22 mins                    G Codes:  Leonarda Leis 06/25/2015, 3:47 PM  Greggory Stallion, PT, DPT [phone]

## 2015-06-25 NOTE — Progress Notes (Signed)
Pt to receive day 2/3 of vp16, picc noted with excellent blood return in both lumens after tpa administered by ICU RN, decadron iv given per orders and vp 16 administered over 1 hour per orders.  1818 chemo completed vss, pt tolerated treatment well

## 2015-06-25 NOTE — Progress Notes (Signed)
Nutrition Follow-up    INTERVENTION:   EN: recommend continuing current TF regimen; discussed persistent hypernatremia during ICU rounds, MD Ramachandran gave orders to increase free water flush frequency to 300 mL q 6 hours. Continue to assess   NUTRITION DIAGNOSIS:   Inadequate oral intake related to acute illness as evidenced by NPO status.   GOAL:   Patient will meet greater than or equal to 90% of their needs  MONITOR:    (Energy Intake, Anthropometrics, Electrolyte/Renal Profile, Digestive System, Electrolyte/Renal Profile)  REASON FOR ASSESSMENT:   Consult Enteral/tube feeding initiation and management  ASSESSMENT:     Pt remains on vent via trach, received first dose of 2nd cycle of chemo yesterday, 2nd dose today. Discussion of PEG placement during ICU rounds but no plans for GI consult at present. Pt on augmentin for sinusitis/tracheitis  EN: tolerating Vital High protein at 50 ml/hr with Prostat TID, 300 mL q 8 hours free water  Skin:  Stage III on sacrum  Digestive System: pt with foul smelling liquid stool, rectal tube in place, Cdiff pending today; noted liquid stool started Sunday, pt started on augmentin on Saturday, could be contributing per pharmacy  Electrolyte and Renal Profile:  Recent Labs Lab 06/19/15 0330 06/20/15 0535  06/22/15 0530 06/22/15 0542 06/24/15 0500 06/25/15 0500  BUN 32* 32*  < > 33* 29* 40* 48*  CREATININE 0.78 0.80  < > 0.76 0.81 0.81 0.63  NA 143 146*  < > 148* 147* 150* 150*  K 4.0 3.5  < > 3.6 3.8 3.4* 4.4  MG 1.9 1.8  --  1.7 1.8 1.9  --   PHOS 4.8* 4.6  --  4.1  --   --   --   < > = values in this interval not displayed. Glucose Profile:  Recent Labs  06/25/15 0439 06/25/15 0748 06/25/15 1131  GLUCAP 181* 169* 165*   Nutritional Anemia Profile:  CBC Latest Ref Rng 06/25/2015 06/24/2015 06/21/2015  WBC 3.6 - 11.0 K/uL 4.9 8.1 9.6  Hemoglobin 12.0 - 16.0 g/dL 7.2(L) 7.2(L) 8.0(L)  Hematocrit 35.0 - 47.0 %  22.9(L) 22.9(L) 24.6(L)  Platelets 150 - 440 K/uL 225 252 256    Meds: ss novolog, lantus  Height:   Ht Readings from Last 1 Encounters:  05/17/2015 '5\' 7"'$  (1.702 m)    Weight:   Wt Readings from Last 1 Encounters:  06/25/15 230 lb 9.6 oz (104.6 kg)    BMI:  Body mass index is 36.11 kg/(m^2).  Estimated Nutritional Needs:   Kcal:  1254-1596kcals, (11-14kcals/kg) using current weight of 114kg)  Protein:  122-153 g (2.0-2.5 g/kg IBW) or 140-176 g (1.2-1.5 g/kg current wt)  Fluid:  1535-1828m of fluid (25-358mkg)  EDUCATION NEEDS:   No education needs identified at this time  HIOchlockneeRD, LDN ([phone]

## 2015-06-26 LAB — BASIC METABOLIC PANEL
ANION GAP: 9 (ref 5–15)
BUN: 71 mg/dL — ABNORMAL HIGH (ref 6–20)
CALCIUM: 8.1 mg/dL — AB (ref 8.9–10.3)
CO2: 24 mmol/L (ref 22–32)
Chloride: 113 mmol/L — ABNORMAL HIGH (ref 101–111)
Creatinine, Ser: 0.9 mg/dL (ref 0.44–1.00)
GFR calc Af Amer: >60 mL/min (ref >60)
GFR calc non Af Amer: >60 mL/min (ref >60)
GLUCOSE: 167 mg/dL — AB (ref 65–99)
Potassium: 4.5 mmol/L (ref 3.5–5.1)
Sodium: 146 mmol/L — ABNORMAL HIGH (ref 135–145)

## 2015-06-26 LAB — GLUCOSE, CAPILLARY
Glucose-Capillary: 126 mg/dL — ABNORMAL HIGH (ref 65–99)
Glucose-Capillary: 134 mg/dL — ABNORMAL HIGH (ref 65–99)
Glucose-Capillary: 138 mg/dL — ABNORMAL HIGH (ref 65–99)
Glucose-Capillary: 160 mg/dL — ABNORMAL HIGH (ref 65–99)
Glucose-Capillary: 164 mg/dL — ABNORMAL HIGH (ref 65–99)
Glucose-Capillary: 198 mg/dL — ABNORMAL HIGH (ref 65–99)
Glucose-Capillary: 216 mg/dL — ABNORMAL HIGH (ref 65–99)

## 2015-06-26 MED ORDER — PRO-STAT SUGAR FREE PO LIQD
30.0000 mL | Freq: Two times a day (BID) | ORAL | Status: DC
  Administered 2015-06-26 – 2015-07-03 (×12): 30 mL
  Administered 2015-07-04: 13:00:00
  Administered 2015-07-04 – 2015-07-15 (×22): 30 mL

## 2015-06-26 MED ORDER — HOT PACK MISC ONCOLOGY
1.0000 | Freq: Once | Status: AC | PRN
  Filled 2015-06-26: qty 1

## 2015-06-26 MED ORDER — HEPARIN SOD (PORK) LOCK FLUSH 100 UNIT/ML IV SOLN
250.0000 [IU] | Freq: Once | INTRAVENOUS | Status: DC | PRN
  Filled 2015-06-26: qty 3

## 2015-06-26 MED ORDER — VITAL HIGH PROTEIN PO LIQD
1000.0000 mL | ORAL | Status: DC
  Administered 2015-06-26 – 2015-06-30 (×4): 1000 mL

## 2015-06-26 MED ORDER — HEPARIN SOD (PORK) LOCK FLUSH 100 UNIT/ML IV SOLN
500.0000 [IU] | Freq: Once | INTRAVENOUS | Status: DC | PRN
  Filled 2015-06-26: qty 5

## 2015-06-26 MED ORDER — SODIUM CHLORIDE 0.9 % IV SOLN
10.0000 mg | Freq: Once | INTRAVENOUS | Status: AC
  Administered 2015-06-26: 10 mg via INTRAVENOUS
  Filled 2015-06-26: qty 1

## 2015-06-26 MED ORDER — SODIUM CHLORIDE 0.9 % IV SOLN
80.0000 mg/m2 | Freq: Once | INTRAVENOUS | Status: AC
  Administered 2015-06-26: 180 mg via INTRAVENOUS
  Filled 2015-06-26: qty 9

## 2015-06-26 MED ORDER — SODIUM CHLORIDE 0.9 % IV SOLN
Freq: Once | INTRAVENOUS | Status: AC
  Administered 2015-06-26: 18:00:00 via INTRAVENOUS

## 2015-06-26 MED ORDER — ALTEPLASE 2 MG IJ SOLR
2.0000 mg | Freq: Once | INTRAMUSCULAR | Status: AC | PRN
  Administered 2015-06-26 (×2): 2 mg
  Filled 2015-06-26 (×2): qty 2

## 2015-06-26 MED ORDER — SODIUM CHLORIDE 0.9 % IJ SOLN
3.0000 mL | INTRAMUSCULAR | Status: DC | PRN
Start: 2015-06-26 — End: 2015-07-16

## 2015-06-26 MED ORDER — STERILE WATER FOR INJECTION IJ SOLN
INTRAMUSCULAR | Status: AC
  Administered 2015-06-26: 4.4 mL
  Filled 2015-06-26: qty 10

## 2015-06-26 MED ORDER — SENNOSIDES-DOCUSATE SODIUM 8.6-50 MG PO TABS
1.0000 | ORAL_TABLET | ORAL | Status: DC
  Filled 2015-06-26: qty 1

## 2015-06-26 MED ORDER — SODIUM CHLORIDE 0.9 % IJ SOLN: 10.0000 mL | INTRAMUSCULAR | Status: DC | PRN

## 2015-06-26 MED ORDER — FUROSEMIDE 10 MG/ML IJ SOLN
20.0000 mg | Freq: Two times a day (BID) | INTRAMUSCULAR | Status: DC
  Administered 2015-06-26 – 2015-06-30 (×9): 20 mg via INTRAVENOUS
  Filled 2015-06-26 (×9): qty 2

## 2015-06-26 NOTE — Consult Note (Signed)
Electrolyte CONSULT NOTE - FOLLOW UP  Pharmacy Consult for Electrolyte monitoring and replacement  Patient Measurements: Height: '5\' 7"'$  (170.2 cm) Weight: 232 lb 5.8 oz (105.4 kg) IBW/kg (Calculated) : 61.6  Vital Signs: Temp: 98.4 F (36.9 C) (12/01 0800) Temp Source: Oral (12/01 0800) BP: 142/79 mmHg (12/01 0900) Pulse Rate: 128 (12/01 0900)  Recent Labs  06/24/15 0500 06/25/15 0500 06/26/15 0532  NA 150* 150* 146*  K 3.4* 4.4 4.5  CL 113* 115* 113*  CO2 '30 28 24  '$ GLUCOSE 139* 194* 167*  BUN 40* 48* 71*  CREATININE 0.81 0.63 0.90  CALCIUM 8.3* 8.3* 8.1*  MG 1.9  --   --    Estimated Creatinine Clearance: 75.7 mL/min (by C-G formula based on Cr of 0.9).   Recent Labs  06/26/15 0015 06/26/15 0351 06/26/15 0719  GLUCAP 216* 160* 134*    Assessment: 69 yo female with SCLC current;y intubated in ICU. Pharmacy consulted for monitoring and managing electrolytes.   Plan:  Electrolytes are WNL. No indication for replacement at this time.  Pharmacy will continue to monitor electrolytes daily and replace as needed.  Nancy Fetter, PharmD Pharmacy Resident 06/26/2015 9:23 AM

## 2015-06-26 NOTE — Progress Notes (Signed)
* [facility]     Assessment and Plan:  Prolonged VDRF -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, chemotherapy; Significant improvement in airway patency by bronchoscopy 11/22. -Continued ventilator dependence, status post tracheostomy.  Right lung, small cell lung cancer. -The patient did have some response with opening of the right mainstem airway. With the first course of chemotherapy. We have since started the second course of chemotherapy. However, her course has been, complicated by severe debility, deconditioning, severe mucus plugging and overall continued general decline in her status. -After this course of chemotherapy. We will need to reevaluate the patient and see if she may be a candidate to go to long-term acute care facility, and potentially receive more chemotherapy. Or change the patient to comfort measures only.  COPD  -Cont nebulized steroids Cont nebulized bronchodilators.  PSVT  -Cont scheduled enteral metoprolol  Anemia  - improving  Thrombocytopenia  - improving  Agitated, delirium. Severe debility and deconditioning.  Volume overload Large TF residuals - resolved  Sinusitis -Currently on a short course of Augmentin.  ?Paraneoplastic Syndrome  -Has severe weakness ? Emily Pratt.  Thrush -Currently on a 3 day course of Diflucan.     Monitor BMET intermittently, check CE Monitor I/Os DVT px: SCDs Monitor CBC intermittently Transfuse per usual guidelines   Date: 06/26/2015  MRN# [id number] Emily Pratt Feb 02, 1946   Emily Pratt is a 69 y.o. old female seen in follow up for chief complaint of  Chief Complaint  Patient presents with  . Respiratory Distress     HPI/ROS: The patient is currently intubated and sedated and cannot provide history or review of systems.   Allergies:  Atorvastatin and Rosuvastatin    Physical Examination:   VS: BP 149/78 mmHg  Pulse 90  Temp(Src) 98.6 F (37 C) (Oral)   Resp 26  Ht '5\' 7"'$  (1.702 m)  Wt 105.4 kg (232 lb 5.8 oz)  BMI 36.38 kg/m2  SpO2 95%  General Appearance: No distress  Neuro:without focal findings, bilateral extremity strength is 2 out of 5, bilateral lower extremity strength is 1 out of 5. HEENT: PERRLA, EOM intact. Pulmonary: normal breath sounds, No wheezing.   CardiovascularNormal S1,S2.  No m/r/g.   Abdomen: Benign, Soft, non-tender. Renal:  No costovertebral tenderness  GU:  Not performed at this time. Endoc: No evident thyromegaly, no signs of acromegaly. Skin:   warm, no rash. Extremities: normal, no cyanosis, clubbing.   LABORATORY PANEL:   CBC  Recent Labs Lab 06/25/15 0500  WBC 4.9  HGB 7.2*  HCT 22.9*  PLT 225   ------------------------------------------------------------------------------------------------------------------  Chemistries   Recent Labs Lab 06/24/15 0500  06/26/15 0532  NA 150*  < > 146*  K 3.4*  < > 4.5  CL 113*  < > 113*  CO2 30  < > 24  GLUCOSE 139*  < > 167*  BUN 40*  < > 71*  CREATININE 0.81  < > 0.90  CALCIUM 8.3*  < > 8.1*  MG 1.9  --   --   < > = values in this interval not displayed. ------------------------------------------------------------------------------------------------------------------  Cardiac Enzymes  Recent Labs Lab 06/20/15 2300  TROPONINI 0.04*   ------------------------------------------------------------  RADIOLOGY:   No results found for this or any previous visit. No results found for this or any previous visit. ------------------------------------------------------------------------------------------------------------------  Thank  you for allowing [facility] to assist in the care of your patient. Our recommendations are noted  above.  Please contact us if we can be of further service.   Marda Stalker, MD.  [facility] Office Number: [phone]  Patricia Pesa, M.D.  Vilinda Boehringer, M.D.   Merton Border, M.D  [place].  I have personally obtained a history, examined the patient, evaluated laboratory and imaging results, formulated the assessment and plan and placed orders. The case was discussed in detail with the critical care RN, critical care pharmacist, nutrition, case manager, unit manager, and respiratory therapist. The Patient requires high complexity decision making for assessment and support, frequent evaluation and titration of therapies, application of advanced monitoring technologies and extensive interpretation of multiple databases. The patient has critical illness that could lead imminently to failure of 1 or more organ systems and requires the highest level of physician preparedness to intervene.  Critical Care Time devoted to patient care services described in this note is 35 minutes and is exclusive of time spent in procedures.

## 2015-06-26 NOTE — Progress Notes (Signed)
Nutrition Follow-up    INTERVENTION:   EN: recommend increasing TF to rate of 60 ml/hr as no issues with TF tolerance, residuals minimal, no vomiting and decreasing Prostat to BID to better meet nutritional needs. Continue current free water flushes. Continue to assess  NUTRITION DIAGNOSIS:   Inadequate oral intake related to acute illness as evidenced by NPO status.  GOAL:   Patient will meet greater than or equal to 90% of their needs  MONITOR:    (Energy Intake, Anthropometrics, Electrolyte/Renal Profile, Digestive System, Electrolyte/Renal Profile)  REASON FOR ASSESSMENT:   Consult Enteral/tube feeding initiation and management  ASSESSMENT:   Pt remains on vent, failed spontaneous weaning trial again today, 3rd dose of 2nd cycle of chemo today, diarrhea improved   EN: tolerating Vital High Protein at 50 ml/hr with minimal residuals  Digestive System: no signs of TF intolerance, no stool since yesterday, rectal tube to be removed, minimal residuals  Skin:  Stage III pressure ulcer  Electrolyte and Renal Profile:  Recent Labs Lab 06/20/15 0535  06/22/15 0530 06/22/15 0542 06/24/15 0500 06/25/15 0500 06/26/15 0532  BUN 32*  < > 33* 29* 40* 48* 71*  CREATININE 0.80  < > 0.76 0.81 0.81 0.63 0.90  NA 146*  < > 148* 147* 150* 150* 146*  K 3.5  < > 3.6 3.8 3.4* 4.4 4.5  MG 1.8  --  1.7 1.8 1.9  --   --   PHOS 4.6  --  4.1  --   --   --   --   < > = values in this interval not displayed. Glucose Profile:  Recent Labs  06/26/15 0351 06/26/15 0719 06/26/15 1128  GLUCAP 160* 134* 138*   Meds: reviewed  Height:   Ht Readings from Last 1 Encounters:  05/16/2015 '5\' 7"'$  (1.702 m)    Weight:   Wt Readings from Last 1 Encounters:  06/26/15 232 lb 5.8 oz (105.4 kg)    BMI:  Body mass index is 36.38 kg/(m^2).  Estimated Nutritional Needs:   Kcal:  1254-1596kcals, (11-14kcals/kg) using current weight of 114kg)  Protein:  122-153 g (2.0-2.5 g/kg IBW) or  140-176 g (1.2-1.5 g/kg current wt)  Fluid:  1535-1862m of fluid (25-311mkg)  EDUCATION NEEDS:   No education needs identified at this time  HICampusRD, LDN [phone]

## 2015-06-26 NOTE — Progress Notes (Signed)
Dr. Grayland Ormond notified that the PICC per the ICU nurse was not functioning and needed to be replaced.  OK to start chemo later today.

## 2015-06-26 NOTE — Plan of Care (Signed)
Problem: Phase I Progression Outcomes Goal: O2 sats > or equal 90% or at baseline Outcome: Not Progressing Failed SBT, Still on vent 30%, 500 tidal volume, 5 PEEP, rate of 15.  Goal: Dyspnea controlled at rest Outcome: Not Progressing During bath, Pt desated to 82%. Goal: Progress activity as tolerated unless otherwise ordered Outcome: Progressing PT worked with patient today. Pt has AROM. Goal: Tolerating diet Outcome: Progressing Tube feeding increased to 21m/hr.

## 2015-06-26 NOTE — Progress Notes (Signed)
Pt to receive day 3/3 of chemo vp16, picc noted with excellent blood return in both lumens, flushed with ease, vss.  1847 bp 117/89 vp16 initiated.  bp at 1900 noted to be decreased at 106/89 vp16 infusion slowed at this time, pt without other s/s of reaction or s/e at this time.  Will monitor 1925 bp 143/91 bp improved vp16 kept at 500/ml /hr. Chemo completed, pt tolerated well, picc flushed with NS and excellent blood return noted.  bp 148/93 at end of treatment.  Pt tolerated well

## 2015-06-26 DEATH — deceased

## 2015-06-27 DIAGNOSIS — R0602 Shortness of breath: Secondary | ICD-10-CM

## 2015-06-27 LAB — CBC
HEMATOCRIT: 24.1 % — AB (ref 35.0–47.0)
HEMOGLOBIN: 7.6 g/dL — AB (ref 12.0–16.0)
MCH: 28.9 pg (ref 26.0–34.0)
MCHC: 31.3 g/dL — AB (ref 32.0–36.0)
MCV: 92.1 fL (ref 80.0–100.0)
PLATELETS: 205 10*3/uL (ref 150–440)
RBC: 2.62 MIL/uL — AB (ref 3.80–5.20)
RDW: 19.5 % — ABNORMAL HIGH (ref 11.5–14.5)
WBC: 4.7 10*3/uL (ref 3.6–11.0)

## 2015-06-27 LAB — BASIC METABOLIC PANEL
Anion gap: 8 (ref 5–15)
BUN: 83 mg/dL — AB (ref 6–20)
CO2: 25 mmol/L (ref 22–32)
CREATININE: 0.87 mg/dL (ref 0.44–1.00)
Calcium: 8.2 mg/dL — ABNORMAL LOW (ref 8.9–10.3)
Chloride: 113 mmol/L — ABNORMAL HIGH (ref 101–111)
Glucose, Bld: 214 mg/dL — ABNORMAL HIGH (ref 65–99)
POTASSIUM: 4.5 mmol/L (ref 3.5–5.1)
SODIUM: 146 mmol/L — AB (ref 135–145)

## 2015-06-27 LAB — GLUCOSE, CAPILLARY
Glucose-Capillary: 192 mg/dL — ABNORMAL HIGH (ref 65–99)
Glucose-Capillary: 171 mg/dL — ABNORMAL HIGH (ref 65–99)
Glucose-Capillary: 144 mg/dL — ABNORMAL HIGH (ref 65–99)
Glucose-Capillary: 111 mg/dL — ABNORMAL HIGH (ref 65–99)
Glucose-Capillary: 152 mg/dL — ABNORMAL HIGH (ref 65–99)
Glucose-Capillary: 143 mg/dL — ABNORMAL HIGH (ref 65–99)

## 2015-06-27 NOTE — Progress Notes (Signed)
[place]  [phone]) [phone] [phone]  ID: Emily Pratt OB: 10-11-1945  MR#: [id number]  JAS#:[id number]  Patient Care Team: Dagoberto Ligas, MD as PCP - General (Internal Medicine)  CHIEF COMPLAINT:  Chief Complaint  Patient presents with  . Respiratory Distress    INTERVAL HISTORY: Patient now has tracheostomy, but remains on a ventilator. She is alert today and able to answer yes or no questions. She continues to be frustrated, but offers no further complaints. No family at bedside. Patient completed cycle 2 of her chemotherapy yesterday.     REVIEW OF SYSTEMS:   Review of Systems  Unable to perform ROS: intubated    PAST MEDICAL HISTORY: Past Medical History  Diagnosis Date  . Anxiety   . Arthritis   . COPD (chronic obstructive pulmonary disease) ([place])   . CHF (congestive heart failure) ([place])   . Hyperlipidemia   . Hypertension   . Diabetes mellitus without complication ([place])   . Chronic kidney disease   . Neuromuscular disorder ([place])     PAST SURGICAL HISTORY: Past Surgical History  Procedure Laterality Date  . Joint replacement Bilateral 2006    both knees  . Ankle surgery    . Tracheostomy tube placement N/A 06/05/2015    Procedure: TRACHEOSTOMY;  Surgeon: Margaretha Sheffield, MD;  Location: [facility] ORS;  Service: ENT;  Laterality: N/A;    FAMILY HISTORY Family History  Problem Relation Age of Onset  . Diabetes Mother   . Cancer Father   . Diabetes Sister   . Stroke Sister        ADVANCED DIRECTIVES:    HEALTH MAINTENANCE: Social History  Substance Use Topics  . Smoking status: Current Every Day Smoker    Types: Cigarettes  . Smokeless tobacco: None     Comment: 0.5 pack /day  . Alcohol Use: No     Colonoscopy:  PAP:  Bone density:  Lipid panel:  Allergies  Allergen Reactions  . Atorvastatin Other (See Comments)    Does not remember why she had Intolerance to Lipitor.  . Rosuvastatin Anxiety    Chest  tigtness and feeling as if she had heartburn.    Current Facility-Administered Medications  Medication Dose Route Frequency Provider Last Rate Last Dose  . acetaminophen (TYLENOL) tablet 650 mg  650 mg Oral Q6H PRN Harrie Foreman, MD   650 mg at 06/24/15 4193   Or  . acetaminophen (TYLENOL) suppository 650 mg  650 mg Rectal Q6H PRN Harrie Foreman, MD      . antiseptic oral rinse solution (CORINZ)  7 mL Mouth Rinse QID Laverle Hobby, MD   7 mL at 06/27/15 1135  . bisacodyl (DULCOLAX) suppository 10 mg  10 mg Rectal Daily PRN Wilhelmina Mcardle, MD      . budesonide (PULMICORT) nebulizer solution 0.5 mg  0.5 mg Nebulization BID Flora Lipps, MD   0.5 mg at 06/27/15 0855  . chlorhexidine gluconate (PERIDEX) 0.12 % solution 15 mL  15 mL Mouth Rinse BID Flora Lipps, MD   15 mL at 06/27/15 0805  . clonazePAM (KLONOPIN) tablet 0.5 mg  0.5 mg Per Tube BID Vishal Mungal, MD   0.5 mg at 06/27/15 1047  . collagenase (SANTYL) ointment   Topical Daily Wilhelmina Mcardle, MD      . famotidine (PEPCID) tablet 20 mg  20 mg Per Tube BID Wilhelmina Mcardle, MD   20 mg at 06/27/15 1047  . feeding supplement (PRO-STAT SUGAR FREE  64) liquid 30 mL  30 mL Per Tube BID Laverle Hobby, MD   30 mL at 06/27/15 1051  . feeding supplement (VITAL HIGH PROTEIN) liquid 1,000 mL  1,000 mL Per Tube Continuous Laverle Hobby, MD 60 mL/hr at 06/27/15 1226 1,000 mL at 06/27/15 1226  . fentaNYL (SUBLIMAZE) injection 25-100 mcg  25-100 mcg Intravenous Q2H PRN Wilhelmina Mcardle, MD   100 mcg at 06/26/15 0854  . free water 300 mL  300 mL Per Tube 4 times per day Laverle Hobby, MD   300 mL at 06/27/15 1226  . furosemide (LASIX) injection 20 mg  20 mg Intravenous Q12H Laverle Hobby, MD   20 mg at 06/27/15 1051  . heparin lock flush 100 unit/mL  500 Units Intracatheter Once PRN Lloyd Huger, MD      . heparin lock flush 100 unit/mL  500 Units Intracatheter Once PRN Lloyd Huger, MD      . heparin  lock flush 100 unit/mL  250 Units Intracatheter Once PRN Lloyd Huger, MD      . hydrALAZINE (APRESOLINE) injection 10-20 mg  10-20 mg Intravenous Q4H PRN Wilhelmina Mcardle, MD      . insulin aspart (novoLOG) injection 0-20 Units  0-20 Units Subcutaneous 6 times per day Wilhelmina Mcardle, MD   3 Units at 06/27/15 1225  . insulin glargine (LANTUS) injection 20 Units  20 Units Subcutaneous Daily Wilhelmina Mcardle, MD   20 Units at 06/27/15 1048  . levalbuterol (XOPENEX) nebulizer solution 0.63 mg  0.63 mg Nebulization Q3H PRN Wilhelmina Mcardle, MD      . levalbuterol [facility]) nebulizer solution 0.63 mg  0.63 mg Nebulization Q6H Wilhelmina Mcardle, MD   0.63 mg at 06/27/15 1500  . losartan (COZAAR) tablet 50 mg  50 mg Oral Daily Laverle Hobby, MD   50 mg at 06/27/15 1047  . metoCLOPramide (REGLAN) injection 5 mg  5 mg Intravenous Q4H PRN Praveen Mannam, MD   5 mg at 06/19/15 0143  . metoprolol (LOPRESSOR) injection 2.5-5 mg  2.5-5 mg Intravenous Q3H PRN Wilhelmina Mcardle, MD   5 mg at 06/27/15 0805  . metoprolol (LOPRESSOR) tablet 100 mg  100 mg Per Tube BID Wilhelmina Mcardle, MD   100 mg at 06/27/15 1047  . midazolam (VERSED) injection 1 mg  1 mg Intravenous Q2H PRN Vishal Mungal, MD   1 mg at 06/27/15 1047  . phenol (CHLORASEPTIC) mouth spray 1 spray  1 spray Mouth/Throat PRN Wilhelmina Mcardle, MD      . polyethylene glycol (MIRALAX / GLYCOLAX) packet 17 g  17 g Oral Daily PRN Laverle Hobby, MD      . sodium chloride 0.9 % injection 10 mL  10 mL Intracatheter PRN Lloyd Huger, MD      . sodium chloride 0.9 % injection 10 mL  10 mL Intracatheter PRN Lloyd Huger, MD      . sodium chloride 0.9 % injection 10 mL  10 mL Intracatheter PRN Lloyd Huger, MD      . sodium chloride 0.9 % injection 3 mL  3 mL Intravenous PRN Lloyd Huger, MD      . sodium chloride 0.9 % injection 3 mL  3 mL Intravenous PRN Lloyd Huger, MD      . sodium chloride 0.9 % injection 3 mL  3 mL  Intravenous PRN Lloyd Huger, MD        OBJECTIVE: [name]  Vitals:   06/27/15 1300 06/27/15 1400  BP: 150/95   Pulse: 104 102  Temp:    Resp: 25 28     Body mass index is 36.45 kg/(m^2).    ECOG FS:4 - Bedbound  General:  intubated Eyes: Pink conjunctiva, anicteric sclera. HEENT:  Trach tube in place. Lungs: Clear to auscultation bilaterally. Heart: Regular rate and rhythm. No rubs, murmurs, or gallops. Abdomen: Soft, nontender, nondistended. No organomegaly noted, normoactive bowel sounds. Musculoskeletal: No edema, cyanosis, or clubbing. Neuro: Alert. Skin: No rashes or petechiae noted.   LAB RESULTS:  Lab Results  Component Value Date   NA 146* 06/27/2015   K 4.5 06/27/2015   CL 113* 06/27/2015   CO2 25 06/27/2015   GLUCOSE 214* 06/27/2015   BUN 83* 06/27/2015   CREATININE 0.87 06/27/2015   CALCIUM 8.2* 06/27/2015   PROT 4.8* 06/08/2015   ALBUMIN 2.1* 06/08/2015   AST 22 06/08/2015   ALT 45 06/08/2015   ALKPHOS 113 06/08/2015   BILITOT 1.0 06/08/2015   GFRNONAA >60 06/27/2015   GFRAA >60 06/27/2015    Lab Results  Component Value Date   WBC 4.7 06/27/2015   NEUTROABS 8.0* 06/21/2015   HGB 7.6* 06/27/2015   HCT 24.1* 06/27/2015   MCV 92.1 06/27/2015   PLT 205 06/27/2015     STUDIES: Dg Chest 1 View  06/24/2015  CLINICAL DATA:  Acute respiratory failure, right pleural effusion, history of COPD, CHF, diabetes, chronic renal insufficiency, and current smoker. EXAM: CHEST 1 VIEW COMPARISON:  Portable chest x-ray of June 22, 2015 FINDINGS: The left lung is well-expanded. There is stable left lower lobe atelectasis with obscuration of the hemidiaphragm. On the right diffusely increased interstitial and alveolar opacities persist. The hemidiaphragm remains obscured. Cardiac silhouette is enlarged but its right border is indistinct. The pulmonary vascularity is mildly prominent though stable. A tracheostomy appliance tube has its tip at the level of the  inferior margin of the clavicular heads which lies 6.4 cm above the carina. The esophagogastric tube tip projects below the inferior margin of the image. The right-sided PICC line tip projects over the junction of the proximal and midportions of the SVC. IMPRESSION: Slight interval deterioration in the appearance of the left lower lobe. Persistent widespread interstitial and alveolar opacities on the right consistent with pneumonia or other alveolar filling process. There is no pneumothorax. Layering of pleural fluid posteriorly on the right is suspected. Electronically Signed   By: David  Martinique M.D.   On: 06/24/2015 07:42   Dg Chest 1 View  06/15/2015  CLINICAL DATA:  Tracheostomy tube placement EXAM: CHEST 1 VIEW COMPARISON:  06/15/2015 FINDINGS: Tracheostomy tube as been placed. The tip is 7.6 cm from the carina. Large right pleural effusion and associated pulmonary opacity are stable. Opacity at the left base is stable. Borderline cardiomegaly. No pneumothorax. Right upper extremity PICC is stable. IMPRESSION: Tracheostomy tube as been placed as described Stable large right pleural effusion and bilateral pulmonary opacity. Electronically Signed   By: Marybelle Killings M.D.   On: 05/31/2015 19:18   Dg Chest 1 View  06/15/2015  CLINICAL DATA:  Dyspnea. History of COPD, congestive heart failure, hypertension, chronic kidney disease. EXAM: CHEST 1 VIEW COMPARISON:  06/12/2015 FINDINGS: Endotracheal tube, enteric catheter, right PICC line terminating in the expected location of proximal superior vena cava are stable. Cardiomediastinal silhouette is borderline enlarged. Mediastinal contours appear intact. The ureter demonstrates atherosclerotic calcifications of the arch. There is no evidence of pneumothorax. There is  slight improvement of the aeration of the right lung with large right pleural effusion. Left lower lobe atelectasis is noted. Osseous structures are without acute abnormality. Soft tissues are  grossly normal. IMPRESSION: Slight improvement of the aeration of the right lung, with persistent large right pleural effusion, an diffuse haziness of the visualized lung parenchyma. Atelectasis of the left lower lobe. Electronically Signed   By: Fidela Salisbury M.D.   On: 06/15/2015 09:12   Dg Chest 1 View  05/31/2015  CLINICAL DATA:  Dyspnea. EXAM: CHEST 1 VIEW COMPARISON:  05/30/2015 FINDINGS: ET tube tip is above the carina. There is a nasogastric tube in place. Right arm PICC line tip is in the SVC. Loculated right pleural effusion is unchanged from previous exam. Cardiac enlargement and aortic atherosclerosis again noted. IMPRESSION: 1. No change and large loculated right pleural effusion. 2. Cardiac enlargement and aortic atherosclerosis. Electronically Signed   By: Kerby Moors M.D.   On: 05/31/2015 09:31   Dg Chest 1 View  05/30/2015  CLINICAL DATA:  Shortness of breath. EXAM: CHEST 1 VIEW COMPARISON:  05/29/2015.  CT 05/20/2015 . FINDINGS: Endotracheal tube, NG tube, right PICC line in stable position. Persistent consolidation of the right lung and right pleural effusion again noted . Heart size stable. No pneumothorax. No acute osseous abnormality. IMPRESSION: 1. Lines and tubes in stable position. 2. Persistent consolidation of the right lung with prominent right pleural effusion. No interim change from prior exam. Electronically Signed   By: Marcello Moores  Register   On: 05/30/2015 07:15   Dg Chest 1 View  05/29/2015  CLINICAL DATA:  Dyspnea, respiratory failure, COPD, lung mass EXAM: CHEST 1 VIEW COMPARISON:  Portable chest x-ray of May 28, 2015 FINDINGS: There is persistent near total atelectasis of the right lung with some aeration noted in the lower hemi thorax. The left lung is clear. There is no significant mediastinal shift. The cardiac silhouette is normal in size. The endotracheal tube tip lies 4.6 cm above the carina. The esophagogastric tube tip projects below the inferior margin  of the image. The right-sided PICC line tip projects over the midportion of the SVC. IMPRESSION: Persistent consolidation of the mid and upper right lung with small amount of residual aerated right lower lung. The left lung is clear. The support tubes are in reasonable position. Electronically Signed   By: David  Martinique M.D.   On: 05/29/2015 07:20   Dg Abd 1 View  06/04/2015  CLINICAL DATA:  69 year old female status post nasogastric tube placement EXAM: ABDOMEN - 1 VIEW COMPARISON:  Prior abdominal radiograph 06/09/2015 FINDINGS: A nasogastric tube projects over the left lower quadrant in the region of the mid stomach (based on prior imaging). The bowel gas pattern is otherwise nonspecific. Opacification of the right lung base consistent with known large right-sided pleural effusion. No acute osseous abnormality. IMPRESSION: Well-positioned nasogastric tube overlying the gastric body. Electronically Signed   By: Jacqulynn Cadet M.D.   On: 06/06/2015 16:12   Dg Abd 1 View  06/06/2015  CLINICAL DATA:  69 year old female with abdominal distention and diarrhea. Initial encounter. EXAM: ABDOMEN - 1 VIEW COMPARISON:  06/02/2015 and earlier. FINDINGS: 2 Portable AP supine views at 1525 hours. Large body habitus. Improved bowel gas pattern since 05/30/2015, although increased gas-filled small and large bowel loops since 06/02/2015. Still, no dilated small bowel loops present. Large bowel caliber is at the upper limits of normal. Enteric tube remains in place. The side hole appears to be at the level  of the diaphragm. Stable visualized osseous structures. IMPRESSION: 1. Increased gas filled large and small bowel loops since 06/02/2015 most suggestive of ileus. 2. Enteric tube in place, but side hole at the level of the distal thoracic esophagus. Advance 6 cm to place the side hole within the stomach. Electronically Signed   By: Genevie Ann M.D.   On: 06/06/2015 15:41   Dg Abd 1 View  06/02/2015  CLINICAL DATA:   Status post OG tube placement. EXAM: ABDOMEN - 1 VIEW COMPARISON:  None. FINDINGS: OG tube is in place with the tip in the stomach. The tube could be advanced 3-4 cm for better positioning. IMPRESSION: As above. Electronically Signed   By: Inge Rise M.D.   On: 06/02/2015 15:21   Dg Abd 1 View  06/02/2015  CLINICAL DATA:  Orogastric tube placement. EXAM: ABDOMEN - 1 VIEW COMPARISON:  Earlier today at 0800 hours. FINDINGS: Motion and patient body habitus degradation. Nasogastric tube terminates at the body of the stomach. The side-port may be above the gastroesophageal junction. No gross free intraperitoneal air. Partial right hemi thorax opacification, incompletely imaged. IMPRESSION: Degraded exam, as detailed above. Nasogastric terminating at the body of the stomach. Electronically Signed   By: Abigail Miyamoto M.D.   On: 06/02/2015 09:28   Dg Abd 1 View  06/02/2015  CLINICAL DATA:  NG tube placement . EXAM: ABDOMEN - 1 VIEW COMPARISON:  05/30/2015 . FINDINGS: NG tube tip noted at the level of the gastroesophageal junction. More distal placement should be considered. Persistent bowel distention. No free air . IMPRESSION: NG tube tip noted at the level of the gastroesophageal junction. More distal placement should be considered.These results will be called to the ordering clinician or representative by the Radiologist Assistant, and communication documented in the PACS or zVision Dashboard. Electronically Signed   By: Marcello Moores  Register   On: 06/02/2015 08:19   Dg Abd 1 View  05/30/2015  CLINICAL DATA:  Lung cancer.  Distended abdomen. EXAM: ABDOMEN - 1 VIEW COMPARISON:  05/20/2015 FINDINGS: Mild gaseous distention of the stomach and transverse colon. No small bowel dilatation to suggest obstruction. No organomegaly. No free air. IMPRESSION: Mild gaseous distention of the stomach and transverse colon. This may reflect mild ileus. Electronically Signed   By: Rolm Baptise M.D.   On: 05/30/2015 16:42   Ct  Head Wo Contrast  06/20/2015  CLINICAL DATA:  Lung cancer.  Diabetes.  CHF.  Oxygen desaturation. EXAM: CT HEAD WITHOUT CONTRAST TECHNIQUE: Contiguous axial images were obtained from the base of the skull through the vertex without intravenous contrast. COMPARISON:  None FINDINGS: Sinuses/Soft tissues: Intubation. Bilateral mastoid effusions. Paranasal sinuses clear. Intracranial: Moderate motion degradation throughout. Moderate low density in the periventricular white matter likely related to small vessel disease. Cerebral atrophy which is age advanced. Carotid and vertebral artery atherosclerosis. Given motion limitation, no mass lesion, hemorrhage, hydrocephalus, acute infarct, intra-axial, or extra-axial fluid collection. IMPRESSION: 1. Moderate motion degraded exam. 2.  Cerebral atrophy and small vessel ischemic change. 3. No definite acute intracranial abnormality. Low sensitivity for intracranial metastasis secondary to noncontrast technique and motion. 4. Bilateral mastoid effusions. Electronically Signed   By: Abigail Miyamoto M.D.   On: 06/20/2015 15:33   Ct Chest W Contrast  06/06/2015  CLINICAL DATA:  Followup chest radiograph. History of CHF, COPD and hypertension. EXAM: CT CHEST WITH CONTRAST TECHNIQUE: Multidetector CT imaging of the chest was performed during intravenous contrast administration. CONTRAST:  51m OMNIPAQUE IOHEXOL 300 MG/ML  SOLN COMPARISON:  05/20/2015 FINDINGS: Mediastinum: The heart size is normal. No pericardial effusion. There is an endotracheal tube with tip above the carina. The nasogastric tube is present with tip in the body of the stomach. The index right supraclavicular lymph node measures 1.5 cm, image 5 of series 2. Previously 2.6 cm. Right paratracheal lymph node measure 2.0 cm, image 19 of series 2. Previously 3.3 cm. Pre-vascular lymph node measures 1.6 cm, image 25 of series 2. Previously 2.3 cm. Lungs/Pleura: There is a moderate size partially loculated right  pleural effusion which has decreased in volume from previous exam. There has been interval improvement in aeration to the right upper lobe and superior segment of right lower lobe compatible with decrease in size of obstructing central right lung mass. Continued complete atelectasis and consolidation of the right middle lobe. The right lung mass is difficult to visualize and accurately remeasure. Mild atelectasis is noted within the posterior and medial left lower lobe. Upper Abdomen: Stable appearance of left adrenal nodule measuring 2.9 x 1.3 cm. The right adrenal gland is normal. Low to intermediate attenuation structure within the posterior right hepatic lobe measures 2.6 cm, image 57 of series 2. Unchanged from previous exam. There is mild perihepatic ascites noted. Musculoskeletal: Scoliosis deformity involves the thoracic spine. IMPRESSION: 1. Interval response to therapy. Since the previous exam there has been improved aeration to the right upper lobe and superior segment of the right lower lobe. Findings are compatible with decrease in size of central obstructing right perihilar lesion 2. Decrease in size of mediastinal and right supraclavicular adenopathy. 3. No new or progressive disease identified. Electronically Signed   By: Kerby Moors M.D.   On: 06/06/2015 10:39   [place] Chest  06/02/2015  CLINICAL DATA:  Pleural effusion.  Lung cancer. EXAM: CHEST ULTRASOUND COMPARISON:  Chest x-ray 06/02/2015. FINDINGS: Moderate right-sided pleural effusion noted. IMPRESSION: Moderate right pleural effusion . Electronically Signed   By: Marcello Moores  Register   On: 06/02/2015 10:05   Dg Chest Port 1 View  06/22/2015  CLINICAL DATA:  69 year old female with history of respiratory failure. EXAM: PORTABLE CHEST 1 VIEW COMPARISON:  Chest x-ray 06/20/2015. FINDINGS: Tracheostomy tube in position with tip approximately 6.6 cm above the carina. A nasogastric tube is seen extending into the stomach, however, the tip of the  nasogastric tube extends below the lower margin of the image. There is a right upper extremity PICC with tip terminating in the proximal superior vena cava. Lung volumes appear normal. Large right pleural effusion slightly decreased compared to the prior examination. Multifocal opacities throughout the right mid to lower lung and in the medial left lung base. No definite left pleural effusion. No evidence of pulmonary edema. Heart size appears borderline enlarged. The patient is rotated to the left on today's exam, resulting in distortion of the mediastinal contours and reduced diagnostic sensitivity and specificity for mediastinal pathology. Atherosclerosis in the thoracic aorta. IMPRESSION: 1. Support apparatus, as above. 2. Slight decreased size of large right pleural effusion. Extensive areas of atelectasis and/or consolidation throughout the right mid to lower lung, and additionally in the medial left lung base. 3. Atherosclerosis. Electronically Signed   By: Vinnie Langton M.D.   On: 06/22/2015 14:10   Dg Chest Port 1 View  06/20/2015  CLINICAL DATA:  Ventilator dependent respiratory failure. Hypoxia. Followup right pleural effusion and postobstructive atelectasis/pneumonia in the right lower lobe and right middle lobe. EXAM: PORTABLE CHEST 1 VIEW COMPARISON:  06/19/2015 and earlier, including CT  chest 06/06/2015. FINDINGS: Endotracheal tube tip in satisfactory position projecting approximately 6 cm above the carina. Right arm PICC tip projects at the junction of the right innominate vein and SVC, unchanged. Nasogastric tube courses below the diaphragm into the stomach. Since the 1114 hr examination yesterday, no change in the dense airspace consolidation in the right middle lobe and right lower lobe and the large right pleural effusion. Prominent bronchovascular markings in the left lung, unchanged, with improvement in the pulmonary venous hypertension. No new pulmonary parenchymal abnormalities.  IMPRESSION: 1. Support apparatus satisfactory. 2. No change since yesterday in the dense atelectasis/pneumonia involving the right middle lobe and right lower lobe. 3. No change in the large right pleural effusion. 4. No new abnormalities. Electronically Signed   By: Evangeline Dakin M.D.   On: 06/20/2015 08:31   Dg Chest Port 1 View  06/19/2015  CLINICAL DATA:  Respiratory failure. Small cell carcinoma the lung. Right pleural effusion. EXAM: PORTABLE CHEST 1 VIEW 11:13 a.m. COMPARISON:  Radiographs from 06/08/2015 through 06/19/2015 at 5:42 a.m. and CT scan of the chest 06/06/2015 FINDINGS: The large right pleural effusion has decreased since the prior exam. PICC tip in the upper superior vena cava. Tracheostomy tube in good position. NG tube tip below the diaphragm. Left lung is clear. Pulmonary vascularity is normal. Right mediastinal adenopathy is noted. IMPRESSION: Decreased large right pleural effusion.  No pneumothorax. Electronically Signed   By: Lorriane Shire M.D.   On: 06/19/2015 11:29   Dg Chest Port 1 View  06/19/2015  CLINICAL DATA:  Respiratory failure with increasing difficulty breathing. History of COPD, diabetes and lung cancer. EXAM: PORTABLE CHEST 1 VIEW COMPARISON:  Radiographs 06/18/2015 and 06/04/2015.  CT 06/06/2015. FINDINGS: 0954 hour. Right arm PICC has been slightly advanced to the level of the upper SVC. Tracheostomy and nasogastric tube appear unchanged. There is progressive volume loss and opacification in the right hemithorax consistent with progressive partial lung collapse. Underlying right pleural effusion appears unchanged. There is improved aeration of the left lung. The heart size appears unchanged. IMPRESSION: Progressive partial right lung collapse with increased pleural parenchymal opacities on the right hemithorax. Support system positioned as above. Electronically Signed   By: Richardean Sale M.D.   On: 06/19/2015 10:11   Dg Chest Port 1 View  06/18/2015   CLINICAL DATA:  Respiratory failure. EXAM: PORTABLE CHEST 1 VIEW COMPARISON:  06/01/2015. FINDINGS: Tracheostomy tube, NG tube, right PICC line stable position. PICC line tip projected over the right subclavian vein . Persistent cardiomegaly. Persistent bilateral pulmonary infiltrates particularly prominent on the right. Persistent bilateral pleural effusions. No interim improvement. No pneumothorax. IMPRESSION: 1. Lines and tubes in stable position. 2. Persistent cardiomegaly with unchanged bilateral pulmonary infiltrates, particular prominent on the right. Persistent bilateral pleural effusions. Findings are consistent with congestive heart failure with asymmetric pulmonary edema. Pneumonia, particularly on the right cannot be excluded. Electronically Signed   By: Marcello Moores  Register   On: 06/18/2015 07:53   Dg Chest Port 1 View  06/22/2015  CLINICAL DATA:  Respiratory failure. EXAM: PORTABLE CHEST 1 VIEW COMPARISON:  06/11/2015. FINDINGS: Tracheostomy tube, NG tube, right PICC line in stable position. Cardiomegaly with pulmonary vascular prominence and bilateral pulmonary alveolar infiltrates particularly the right. Findings suggest congestive heart failure with asymmetric pulmonary edema. Pneumonia cannot be excluded. No pneumothorax. IMPRESSION: 1. Lines and tubes in stable position. 2. Cardiomegaly with persistent bilateral pulmonary infiltrates, particularly prominent on the right. Persistent right pleural effusion. Findings suggest congestive heart failure with  asymmetric pulmonary edema. Pneumonia cannot be excluded. No interim change from prior exam . Electronically Signed   By: [place]   On: 06/04/2015 07:28   Dg Chest Port 1 View  06/12/2015  CLINICAL DATA:  Short of breath. EXAM: PORTABLE CHEST 1 VIEW COMPARISON:  06/09/2015 FINDINGS: Endotracheal tube remains in good position. Right arm PICC tip in the proximal SVC unchanged. NG tube in place with the tip not visualized Complete  opacification of the right chest which has progressed since the prior study. This is likely due to pleural effusion and collapse. Underlying pneumonia or tumor not excluded. Cardiac enlargement. Vascular congestion and on the left without pulmonary edema or effusion on the left. IMPRESSION: Endotracheal tube remains in good position Complete opacification of the right chest due to enlarging effusion and collapse of the right lung. Electronically Signed   By: Franchot Gallo M.D.   On: 06/12/2015 07:44   Dg Chest Port 1 View  06/09/2015  CLINICAL DATA:  Intubation and OG tube placement EXAM: PORTABLE CHEST 1 VIEW COMPARISON:  1 day prior FINDINGS: Endotracheal tube terminates 4.1 cm above carina.Nasogastric tube extends beyond the inferior aspect of the film. A right-sided PICC line is poorly visualized centrally but likely terminates at the low SVC. The patient is minimally rotated left. Cardiomegaly accentuated by AP portable technique. Layering right pleural effusion. No pneumothorax. Interstitial edema is asymmetric and worse on the right. Lower lobe predominant right worse than left airspace disease is minimally improved. IMPRESSION: Minimal improvement in aeration since the exam of 1 day prior. Congestive heart failure with asymmetric interstitial and airspace opacities, worse on the right. Concurrent infection cannot be excluded. Layering right pleural effusion. Electronically Signed   By: Abigail Miyamoto M.D.   On: 06/09/2015 12:09   Dg Chest Port 1 View  06/08/2015  CLINICAL DATA:  Respiratory failure.  Followup exam. EXAM: PORTABLE CHEST 1 VIEW COMPARISON:  Chest CT and chest radiographs, 06/06/2015. FINDINGS: There is near complete opacification of the right hemi thorax consistent with combination of atelectasis/ consolidation and pleural fluid. There is, however, some more aeration of the mid to upper lung on the right than there was on the prior chest radiographs. Left lung is essentially clear.  No  pneumothorax. Right-sided PICC is stable. Endotracheal tube and nasogastric tube have been removed. IMPRESSION: 1. Mild improvement from prior study with some more aeration evident of the right mid and upper lung. This is consistent with a decrease in lung consolidation/atelectasis. There is still significant opacification throughout most of the right hemi thorax. No new abnormalities. Electronically Signed   By: Lajean Manes M.D.   On: 06/08/2015 08:14   Dg Chest Port 1 View  06/06/2015  CLINICAL DATA:  Respiratory failure . EXAM: PORTABLE CHEST 1 VIEW COMPARISON:  06/02/2015.  CT 05/20/2015 . FINDINGS: Endotracheal tube 8 cm above the carina at the thoracic inlet. Slight interim advancement should be considered. NG tube and right PICC line in stable position. Progressive consolidation/ atelectasis of the right lung and progressive right pleural effusion. Right pleural effusion is large. Left lung is clear. Stable cardiomegaly. IMPRESSION: 1. Endotracheal tube 8 cm above the carina at the thoracic inlet. Slightly mid aspect should be considered. NG tube and right PICC line stable position. 2. Progression of consolidation/atelectasis of the right lung with progression of the right pleural effusion. The right pleural effusion is large. 3. Stable cardiomegaly. Electronically Signed   By: Marcello Moores  Register   On: 06/06/2015 07:11  Dg Chest Port 1 View  06/02/2015  CLINICAL DATA:  69 year old female with a history of small cell lung cancer EXAM: PORTABLE CHEST 1 VIEW COMPARISON:  Multiple prior plain film, including 05/31/2015, limb 10/2014, 05/29/2015, CT chest 05/20/2015 FINDINGS: Cardiomediastinal silhouette obscured by overlying lung/ pleural disease. Unchanged position of endotracheal tube, terminating suitably above the carina, 4.5 cm. Unchanged gastric tube, terminating out of the field of view. Unchanged right upper extremity PICC, which terminates at the level the right mainstem bronchus. Overlying EKG  leads and ventilator tubing. Atherosclerosis of the aortic arch. Dense opacity of the right lung persists, with obscuration the right hemidiaphragm, right heart borders, and the majority of the right lung. Minimal maintained aeration at the right apex. Airspace opacity at the left base persists, with similar aeration to the prior. No pneumothorax. IMPRESSION: Similar appearance of dense right-sided airspace opacity, likely a combination of lung consolidation, atelectasis, tumor tissue, and pleural effusion. Unchanged position of support apparatus, as above. Airspace disease at the left base, similar to the comparison, likely combination of atelectasis, edema, consolidation. Small left pleural effusion not excluded. Atherosclerosis. Signed, Dulcy Fanny. Earleen Newport, DO [facility] Electronically Signed   By: Corrie Mckusick D.O.   On: 06/02/2015 08:23   Dg Abd Portable 1v  06/22/2015  CLINICAL DATA:  NG tube residuals.  Check placement EXAM: PORTABLE ABDOMEN - 1 VIEW COMPARISON:  Radiograph 06/15/2015 FINDINGS: NG tube extends into the stomach. The side port is below the GE junction. Tip is below the margin of film. Second film was provided which demonstrates tip within the gastric body. Bilateral basilar effusions and atelectasis. IMPRESSION: NG tube appears in proper position. Electronically Signed   By: Suzy Bouchard M.D.   On: 06/22/2015 09:29   Dg Abd Portable 1v  06/09/2015  CLINICAL DATA:  OG tube placement EXAM: PORTABLE ABDOMEN - 1 VIEW COMPARISON:  06/06/2015 FINDINGS: The nasogastric tube tip is in the body of stomach. Side port is well below the GE junction. Mild gaseous distension of large and small bowel loops unchanged. IMPRESSION: 1. NG tube tip is in the body of stomach. Electronically Signed   By: Kerby Moors M.D.   On: 06/09/2015 12:04    ASSESSMENT:  Small cell lung cancer.   PLAN:    1. Small cell lung cancer: Patient is likely  stage IV given her suspicious adrenal lesion. Recent CT of her head on June 20, 2015 did not report any metastatic disease.  CT scan results from June 06, 2015 revealed a positive response to cycle 1 of treatment with decreased size of her mass. Her persistent requirement for mechanical ventilation may more likely be secondary to poor lung function, pleural effusion, and COPD than external compression of her mass at this point. Patient completed cycle 2 of cisplatin and etoposide yesterday. Cycle 3 will be scheduled in 3-4 weeks. She will ultimately require port placement once she is off the ventilator. XRT would be helpful, but there are patient safety concerns transporting to radiation oncology.   2. Thrombocytopenia: Platelet count is now within normal limits. Monitor. Monitor daily CBC as patient's platelet count may decrease secondary to chemotherapy. 3. Anemia:  Likely secondary to chemotherapy. Continue to transfuse if hemoglobin falls below 7.0.  4. White blood cell count: Currently within normal limits. Patient became neutropenic secondary to chemotherapy after cycle 5. We will continue to monitor blood count and initiate Neupogen if necessary. 6. Tracheostomy:  Appreciate ENT input. Tracheostomy in  place.  7. Disposition: Long-term ventilation and tracheostomy facilities have been discussed, but this poses a problem for transportation back and forth for chemotherapy. Case previously discussed with case management as well as long-term facility representative.   Will follow.   Lloyd Huger, MD   06/27/2015 3:44 PM

## 2015-06-27 NOTE — Consult Note (Signed)
Electrolyte CONSULT NOTE - FOLLOW UP  Pharmacy Consult for Electrolyte monitoring and replacement  Patient Measurements: Height: '5\' 7"'$  (170.2 cm) Weight: 232 lb 12.9 oz (105.6 kg) IBW/kg (Calculated) : 61.6  Vital Signs: Temp: 98.9 F (37.2 C) (12/01 1952) Temp Source: Axillary (12/01 1952) BP: 153/96 mmHg (12/02 0500) Pulse Rate: 97 (12/02 0500)  Recent Labs  06/25/15 0500 06/26/15 0532 06/27/15 0500  NA 150* 146* 146*  K 4.4 4.5 4.5  CL 115* 113* 113*  CO2 '28 24 25  '$ GLUCOSE 194* 167* 214*  BUN 48* 71* 83*  CREATININE 0.63 0.90 0.87  CALCIUM 8.3* 8.1* 8.2*   Estimated Creatinine Clearance: 78.5 mL/min (by C-G formula based on Cr of 0.87).   Recent Labs  06/26/15 1952 06/26/15 2350 06/27/15 0419  GLUCAP 164* 198* 192*    Assessment: 69 yo female with SCLC current;y intubated in ICU. Pharmacy consulted for monitoring and managing electrolytes.   Plan:  Electrolytes are WNL or slightly elevated. No indication for replacement at this time.  Pharmacy will continue to monitor electrolytes daily and replace as needed.  Sim Boast, PharmD, BCPS  06/27/2015

## 2015-06-27 NOTE — Progress Notes (Signed)
* [facility]  69 year old female with new diagnosis of small cell lung cancer, subsequently intubated due to right lung atelectasis and respiratory failure, because of the lung cancer. She is now status post 2 cycles of chemotherapy to see if this tumor can be shrunk.   Assessment and Plan:  Prolonged VDRF -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, chemotherapy; Significant improvement in airway patency by bronchoscopy 11/22. -Continued ventilator dependence, status post tracheostomy.  Right lung, small cell lung cancer. -The patient did have some response with opening of the right mainstem airway. With the first course of chemotherapy. Her course has been, complicated by severe debility, deconditioning, severe mucus plugging and overall continued general decline in her status. -After this course of chemotherapy. We will need to reevaluate the patient and see if she may be a candidate to go to long-term acute care facility, and potentially receive more chemotherapy or to change the patient to comfort measures only.  COPD  -Cont nebulized steroids Cont nebulized bronchodilators.  PSVT  -Cont scheduled enteral metoprolol  Anemia  - improving  Thrombocytopenia  - improving  Agitated, delirium. Severe debility and deconditioning.  Volume overload Large TF residuals - resolved  Sinusitis -Completed 5 day course.   ?Paraneoplastic Syndrome  -Has severe weakness ? [name].  Thrush -Completing a 3 day course of diflucan.      Monitor BMET intermittently, check CE Monitor I/Os DVT px: SCDs Monitor CBC intermittently Transfuse per usual guidelines   Date: 06/27/2015  MRN# [id number] Emily Pratt 12/16/1945   Emily Pratt is a 69 y.o. old female seen in follow up for chief complaint of  Chief Complaint  Patient presents with  . Respiratory Distress     HPI/ROS: The patient is currently intubated and sedated and cannot provide  history or review of systems.   Allergies:  Atorvastatin and Rosuvastatin    Physical Examination:   VS: BP 145/87 mmHg  Pulse 130  Temp(Src) 97.7 F (36.5 C) (Axillary)  Resp 19  Ht '5\' 7"'$  (1.702 m)  Wt 105.6 kg (232 lb 12.9 oz)  BMI 36.45 kg/m2  SpO2 96%  General Appearance: No distress  Neuro:without focal findings, bilateral extremity strength is 2 out of 5, bilateral lower extremity strength is 1 out of 5. HEENT: PERRLA, EOM intact. Pulmonary: normal breath sounds, scattered bilateral wheezing.   CardiovascularNormal S1,S2.  No m/r/g.   Abdomen: Benign, Soft, non-tender. Renal:  No costovertebral tenderness  GU:  Not performed at this time. Endoc: No evident thyromegaly, no signs of acromegaly. Skin:   warm, no rash. Extremities: normal, no cyanosis, clubbing.   LABORATORY PANEL:   CBC  Recent Labs Lab 06/27/15 0500  WBC 4.7  HGB 7.6*  HCT 24.1*  PLT 205   ------------------------------------------------------------------------------------------------------------------  Chemistries   Recent Labs Lab 06/24/15 0500  06/27/15 0500  NA 150*  < > 146*  K 3.4*  < > 4.5  CL 113*  < > 113*  CO2 30  < > 25  GLUCOSE 139*  < > 214*  BUN 40*  < > 83*  CREATININE 0.81  < > 0.87  CALCIUM 8.3*  < > 8.2*  MG 1.9  --   --   < > = values in this interval not displayed. ------------------------------------------------------------------------------------------------------------------  Cardiac Enzymes  Recent Labs Lab 06/20/15 2300  TROPONINI 0.04*   ------------------------------------------------------------  RADIOLOGY:   No results found for this or any previous visit. No results  found for this or any previous visit. ------------------------------------------------------------------------------------------------------------------  Thank  you for allowing [facility] to assist in the care of your patient. Our recommendations  are noted above.  Please contact us if we can be of further service.   Marda Stalker, MD.  [facility] Office Number: [phone]  Patricia Pesa, M.D.  Vilinda Boehringer, M.D.  Merton Border, M.D  [place].  I have personally obtained a history, examined the patient, evaluated laboratory and imaging results, formulated the assessment and plan and placed orders. The case was discussed in detail with the critical care RN, critical care pharmacist, nutrition, case manager, unit manager, and respiratory therapist. The Patient requires high complexity decision making for assessment and support, frequent evaluation and titration of therapies, application of advanced monitoring technologies and extensive interpretation of multiple databases. The patient has critical illness that could lead imminently to failure of 1 or more organ systems and requires the highest level of physician preparedness to intervene.  Critical Care Time devoted to patient care services described in this note is 35 minutes and is exclusive of time spent in procedures.

## 2015-06-27 NOTE — Progress Notes (Signed)
Nutrition Follow-up   INTERVENTION:   EN: recommend continuing current TF regimen, continue current free water flushes of 300 mL q 6 hours per MD Ashby Dawes   NUTRITION DIAGNOSIS:   Inadequate oral intake related to acute illness as evidenced by NPO status. Being addressed via TF  GOAL:   Patient will meet greater than or equal to 90% of their needs  MONITOR:    (Energy Intake, Anthropometrics, Electrolyte/Renal Profile, Digestive System, Electrolyte/Renal Profile)  REASON FOR ASSESSMENT:   Consult Enteral/tube feeding initiation and management  ASSESSMENT:    Pt received dose 3/, 2nd cycle of chemo yesterday; remains on vent via trach, continues to fail vent weaning trials  EN: tolerating Vital High Protein at rate of 60 ml/hr, Prostat BID, 300 mL q 6 hours via NG  Digestive System: no signs of TF intolerance, +multiple loose stools in past 24 hours, rectal tube removed yesterday but then reinserted  Skin:  Stage III pressure ulcer on sacrum  Electrolyte and Renal Profile:  Recent Labs Lab 06/22/15 0530 06/22/15 0542 06/24/15 0500 06/25/15 0500 06/26/15 0532 06/27/15 0500  BUN 33* 29* 40* 48* 71* 83*  CREATININE 0.76 0.81 0.81 0.63 0.90 0.87  NA 148* 147* 150* 150* 146* 146*  K 3.6 3.8 3.4* 4.4 4.5 4.5  MG 1.7 1.8 1.9  --   --   --   PHOS 4.1  --   --   --   --   --    Glucose Profile:  Recent Labs  06/26/15 2350 06/27/15 0419 06/27/15 0732  GLUCAP 198* 192* 171*   Meds: senokot every other day, reglan prn, ss novolog, lantus  Height:   Ht Readings from Last 1 Encounters:  04/29/2015 '5\' 7"'$  (1.702 m)    Weight:   Wt Readings from Last 1 Encounters:  06/27/15 232 lb 12.9 oz (105.6 kg)    BMI:  Body mass index is 36.45 kg/(m^2).  Estimated Nutritional Needs:   Kcal:  1254-1596kcals, (11-14kcals/kg) using current weight of 114kg)  Protein:  122-153 g (2.0-2.5 g/kg IBW) or 140-176 g (1.2-1.5 g/kg current wt)  Fluid:  1535-1846m of fluid  (25-373mkg)  EDUCATION NEEDS:   No education needs identified at this time  HIWhitfieldRD, LDN ([phone]

## 2015-06-28 LAB — BASIC METABOLIC PANEL
ANION GAP: 8 (ref 5–15)
BUN: 88 mg/dL — ABNORMAL HIGH (ref 6–20)
CALCIUM: 8.6 mg/dL — AB (ref 8.9–10.3)
CHLORIDE: 113 mmol/L — AB (ref 101–111)
CO2: 28 mmol/L (ref 22–32)
Creatinine, Ser: 0.86 mg/dL (ref 0.44–1.00)
GFR calc non Af Amer: >60 mL/min (ref >60)
GLUCOSE: 137 mg/dL — AB (ref 65–99)
POTASSIUM: 4.1 mmol/L (ref 3.5–5.1)
Sodium: 149 mmol/L — ABNORMAL HIGH (ref 135–145)

## 2015-06-28 LAB — GLUCOSE, CAPILLARY
Glucose-Capillary: 111 mg/dL — ABNORMAL HIGH (ref 65–99)
Glucose-Capillary: 112 mg/dL — ABNORMAL HIGH (ref 65–99)
Glucose-Capillary: 130 mg/dL — ABNORMAL HIGH (ref 65–99)
Glucose-Capillary: 134 mg/dL — ABNORMAL HIGH (ref 65–99)

## 2015-06-28 LAB — CBC
HCT: 22.8 % — ABNORMAL LOW (ref 35.0–47.0)
Hemoglobin: 7.2 g/dL — ABNORMAL LOW (ref 12.0–16.0)
MCH: 28.9 pg (ref 26.0–34.0)
MCHC: 31.7 g/dL — ABNORMAL LOW (ref 32.0–36.0)
MCV: 91.2 fL (ref 80.0–100.0)
PLATELETS: 200 10*3/uL (ref 150–440)
RBC: 2.5 MIL/uL — AB (ref 3.80–5.20)
RDW: 19.5 % — ABNORMAL HIGH (ref 11.5–14.5)
WBC: 4.3 10*3/uL (ref 3.6–11.0)

## 2015-06-28 MED ORDER — FREE WATER
350.0000 mL | Freq: Four times a day (QID) | Status: DC
  Administered 2015-06-28 – 2015-07-12 (×50): 350 mL

## 2015-06-28 NOTE — Progress Notes (Signed)
* [facility]  69 year old female with new diagnosis of small cell lung cancer, subsequently intubated due to right lung atelectasis and respiratory failure, because of the lung cancer. She is now status post 2 cycles of chemotherapy to see if this tumor can be shrunk.   Assessment and Plan:  Prolonged VDRF -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, chemotherapy; Significant improvement in airway patency by bronchoscopy 11/22. -Continued ventilator dependence, status post tracheostomy.  Right lung, small cell lung cancer. -The patient did have some response with opening of the right mainstem airway. With the first course of chemotherapy. Her course has been, complicated by severe debility, deconditioning, severe mucus plugging and overall continued general decline in her status. -After this course of chemotherapy. We will need to reevaluate the patient and see if she may be a candidate to go to long-term acute care facility, and potentially receive more chemotherapy or to change the patient to comfort measures only. -Patient may require PEG tube placement, before her platelets after this round of chemotherapy starts to drop again -Hematology/oncology currently following, currently on cycle 2 of cisplatin and etoposide -Patient would benefit from radiation therapy, but there is concern for possible patient's safety transportation issues to radiation oncology.  COPD  -Cont nebulized steroids Cont nebulized bronchodilators.  PSVT  -Cont scheduled enteral metoprolol  Anemia  - improving  Thrombocytopenia  - improving  Agitated, delirium. Severe debility and deconditioning.  Volume overload Large TF residuals - resolved  Sinusitis -Completed 5 day course.   ?Paraneoplastic Syndrome  -Has severe weakness ? Emily Pratt.  Thrush -Completing a 3 day course of diflucan.      Monitor BMET intermittently, check CE Monitor I/Os DVT px: SCDs Monitor CBC  intermittently Transfuse per usual guidelines   Date: 06/28/2015  MRN# [id number] Emily Pratt 12/31/1945   Emily Pratt is a 69 y.o. old female seen in follow up for chief complaint of  Chief Complaint  Patient presents with  . Respiratory Distress     HPI/ROS: The patient is currently intubated and sedated and cannot provide history or review of systems. Easily awakened, able to blink eyes and moves feet slightly today, on the right  Allergies:  Atorvastatin and Rosuvastatin    Physical Examination:   VS: BP 165/79 mmHg  Pulse 88  Temp(Src) 97.7 F (36.5 C) (Axillary)  Resp 23  Ht '5\' 7"'$  (1.702 m)  Wt 232 lb 12.9 oz (105.6 kg)  BMI 36.45 kg/m2  SpO2 98%  General Appearance: No distress  Neuro:without focal findings, bilateral extremity strength is 2 out of 5, bilateral lower extremity strength is 1 out of 5. HEENT: PERRLA, EOM intact. Pulmonary: normal breath sounds, scattered bilateral wheezing.   CardiovascularNormal S1,S2.  No m/r/g.   Abdomen: Benign, Soft, non-tender. Renal:  No costovertebral tenderness  GU:  Not performed at this time. Endoc: No evident thyromegaly, no signs of acromegaly. Skin:   warm, no rash. Extremities: normal, no cyanosis, clubbing.   LABORATORY PANEL:   CBC  Recent Labs Lab 06/28/15 0500  WBC 4.3  HGB 7.2*  HCT 22.8*  PLT 200   ------------------------------------------------------------------------------------------------------------------  Chemistries   Recent Labs Lab 06/24/15 0500  06/28/15 0500  NA 150*  < > 149*  K 3.4*  < > 4.1  CL 113*  < > 113*  CO2 30  < > 28  GLUCOSE 139*  < > 137*  BUN 40*  < > 88*  CREATININE 0.81  < >  0.86  CALCIUM 8.3*  < > 8.6*  MG 1.9  --   --   < > = values in this interval not displayed. ------------------------------------------------------------------------------------------------------------------  Cardiac Enzymes No results for input(s): TROPONINI in the last 168  hours. ------------------------------------------------------------  RADIOLOGY:   No results found for this or any previous visit. No results found for this or any previous visit. ------------------------------------------------------------------------------------------------------------------  Thank  you for allowing [facility] to assist in the care of your patient. Our recommendations are noted above.  Please contact us if we can be of further service.  Vilinda Boehringer, MD [facility] Pager [phone] (please enter 7-digits) On Call Pager - [phone] (please enter 7-digits)   [place].  I have personally obtained a history, examined the patient, evaluated laboratory and imaging results, formulated the assessment and plan and placed orders. The case was discussed in detail with the critical care RN, critical care pharmacist, nutrition, case manager, unit manager, and respiratory therapist. The Patient requires high complexity decision making for assessment and support, frequent evaluation and titration of therapies, application of advanced monitoring technologies and extensive interpretation of multiple databases. The patient has critical illness that could lead imminently to failure of 1 or more organ systems and requires the highest level of physician preparedness to intervene.  Critical Care Time devoted to patient care services described in this note is 35 minutes and is exclusive of time spent in procedures.

## 2015-06-28 NOTE — Progress Notes (Signed)
Brief Nutrition Follow-up   INTERVENTION:   EN: recommend continuing current TF regimen, will increase free water flushes to 350 mL q 6 hours (previously 336m q 6 hours) per MD Mungal to aid in hypernatremia   NUTRITION DIAGNOSIS:   Inadequate oral intake related to acute illness as evidenced by NPO status. 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  Glucose Profile:  Recent Labs  06/28/15 0332 06/28/15 0709 06/28/15 1126  GLUCAP 130* 111* 112*   Medications: . antiseptic oral rinse  7 mL Mouth Rinse QID  . budesonide (PULMICORT) nebulizer solution  0.5 mg Nebulization BID  . chlorhexidine gluconate  15 mL Mouth Rinse BID  . clonazePAM  0.5 mg Per Tube BID  . collagenase   Topical Daily  . famotidine  20 mg Per Tube BID  . feeding supplement (PRO-STAT SUGAR FREE 64)  30 mL Per Tube BID  . free water  350 mL Per Tube 4 times per day  . furosemide  20 mg Intravenous Q12H  . insulin aspart  0-20 Units Subcutaneous 6 times per day  . insulin glargine  20 Units Subcutaneous Daily  . levalbuterol  0.63 mg Nebulization  Q6H  . losartan  50 mg Oral Daily  . metoprolol tartrate  100 mg Per Tube BID   Infusion Medications: . feeding supplement (VITAL HIGH PROTEIN) 1,000 mL (06/28/15 0700)     Filed Weights   06/26/15 0500 06/27/15 0500 06/28/15 0434  Weight: 232 lb 5.8 oz (105.4 kg) 232 lb 12.9 oz (105.6 kg) 232 lb 12.9 oz (105.6 kg)   BMI: Body mass index is 36.45 kg/(m^2).   Estimated Nutritional Needs:   Kcal: 1254-1596kcals, (11-14kcals/kg) using current weight of 114kg)  Protein: 122-153 g (2.0-2.5 g/kg IBW) or 140-176 g (1.2-1.5 g/kg current wt)  Fluid: 1535-18474mof fluid (25-3067mg)    HIGH Care Level     AllDwyane LuoD, LDN Pager [phone]

## 2015-06-28 NOTE — Consult Note (Signed)
Electrolyte CONSULT NOTE - FOLLOW UP  Pharmacy Consult for Electrolyte monitoring and replacement  Patient Measurements: Height: '5\' 7"'$  (170.2 cm) Weight: 232 lb 12.9 oz (105.6 kg) IBW/kg (Calculated) : 61.6  Vital Signs: BP: 165/79 mmHg (12/03 0600) Pulse Rate: 88 (12/03 0600)  Recent Labs  06/26/15 0532 06/27/15 0500 06/28/15 0500  NA 146* 146* 149*  K 4.5 4.5 4.1  CL 113* 113* 113*  CO2 '24 25 28  '$ GLUCOSE 167* 214* 137*  BUN 71* 83* 88*  CREATININE 0.90 0.87 0.86  CALCIUM 8.1* 8.2* 8.6*   Estimated Creatinine Clearance: 79.4 mL/min (by C-G formula based on Cr of 0.86).   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 Pharmacy consulted for monitoring and managing electrolytes.   Plan:  Electrolytes are WNL or slightly elevated. No indication for replacement at this time.    12/3 Electrolytes are WNL or slightly elevated. No indication for replacement at this time. F/u BMP in AM.  Pharmacy will continue to monitor electrolytes daily and replace as needed.  Sim Boast, PharmD, BCPS  06/28/2015

## 2015-06-29 ENCOUNTER — Other Ambulatory Visit: Payer: Self-pay | Admitting: Family Medicine

## 2015-06-29 ENCOUNTER — Inpatient Hospital Stay: Payer: Medicare Other

## 2015-06-29 LAB — COMPREHENSIVE METABOLIC PANEL
ALT: 58 U/L — AB (ref 14–54)
AST: 60 U/L — AB (ref 15–41)
Albumin: 2.9 g/dL — ABNORMAL LOW (ref 3.5–5.0)
Alkaline Phosphatase: 140 U/L — ABNORMAL HIGH (ref 38–126)
Anion gap: 9 (ref 5–15)
BUN: 76 mg/dL — AB (ref 6–20)
CHLORIDE: 109 mmol/L (ref 101–111)
CO2: 28 mmol/L (ref 22–32)
Calcium: 8.5 mg/dL — ABNORMAL LOW (ref 8.9–10.3)
Creatinine, Ser: 0.8 mg/dL (ref 0.44–1.00)
GLUCOSE: 125 mg/dL — AB (ref 65–99)
POTASSIUM: 4.2 mmol/L (ref 3.5–5.1)
SODIUM: 146 mmol/L — AB (ref 135–145)
Total Bilirubin: 0.9 mg/dL (ref 0.3–1.2)
Total Protein: 6.8 g/dL (ref 6.5–8.1)

## 2015-06-29 LAB — CBC
HCT: 23.8 % — ABNORMAL LOW (ref 35.0–47.0)
Hemoglobin: 7.5 g/dL — ABNORMAL LOW (ref 12.0–16.0)
MCH: 29 pg (ref 26.0–34.0)
MCHC: 31.6 g/dL — ABNORMAL LOW (ref 32.0–36.0)
MCV: 92 fL (ref 80.0–100.0)
PLATELETS: 175 10*3/uL (ref 150–440)
RBC: 2.59 MIL/uL — AB (ref 3.80–5.20)
RDW: 19.1 % — AB (ref 11.5–14.5)
WBC: 5 10*3/uL (ref 3.6–11.0)

## 2015-06-29 LAB — GLUCOSE, CAPILLARY
Glucose-Capillary: 124 mg/dL — ABNORMAL HIGH (ref 65–99)
Glucose-Capillary: 127 mg/dL — ABNORMAL HIGH (ref 65–99)
Glucose-Capillary: 110 mg/dL — ABNORMAL HIGH (ref 65–99)
Glucose-Capillary: 168 mg/dL — ABNORMAL HIGH (ref 65–99)
Glucose-Capillary: 129 mg/dL — ABNORMAL HIGH (ref 65–99)

## 2015-06-29 NOTE — Progress Notes (Signed)
[place]  [phone] [phone]  ID: KEONI HAVEY OB: Mar 25, 1946  MR#: [id number]  VOH#:[id number]  Patient Care Team: Dagoberto Ligas, MD as PCP - General (Internal Medicine)  CHIEF COMPLAINT:  Chief Complaint  Patient presents with  . Respiratory Distress    INTERVAL HISTORY: Patient now has tracheostomy, but remains on a ventilator. She is alert today and able to answer yes or no questions. She continues to be frustrated, but offers no further complaints. No family at bedside. Patient completed cycle 2 of her chemotherapy earlier this week.   REVIEW OF SYSTEMS:   Review of Systems  Unable to perform ROS: intubated    PAST MEDICAL HISTORY: Past Medical History  Diagnosis Date  . Anxiety   . Arthritis   . COPD (chronic obstructive pulmonary disease) ([place])   . CHF (congestive heart failure) ([place])   . Hyperlipidemia   . Hypertension   . Diabetes mellitus without complication ([place])   . Chronic kidney disease   . Neuromuscular disorder ([place])     PAST SURGICAL HISTORY: Past Surgical History  Procedure Laterality Date  . Joint replacement Bilateral 2006    both knees  . Ankle surgery    . Tracheostomy tube placement N/A 06/23/2015    Procedure: TRACHEOSTOMY;  Surgeon: Margaretha Sheffield, MD;  Location: [facility] ORS;  Service: ENT;  Laterality: N/A;    FAMILY HISTORY Family History  Problem Relation Age of Onset  . Diabetes Mother   . Cancer Father   . Diabetes Sister   . Stroke Sister        ADVANCED DIRECTIVES:    HEALTH MAINTENANCE: Social History  Substance Use Topics  . Smoking status: Current Every Day Smoker    Types: Cigarettes  . Smokeless tobacco: None     Comment: 0.5 pack /day  . Alcohol Use: No     Colonoscopy:  PAP:  Bone density:  Lipid panel:  Allergies  Allergen Reactions  . Atorvastatin Other (See Comments)    Does not remember why she had Intolerance to Lipitor.  . Rosuvastatin Anxiety   Chest tigtness and feeling as if she had heartburn.    Current Facility-Administered Medications  Medication Dose Route Frequency Provider Last Rate Last Dose  . acetaminophen (TYLENOL) tablet 650 mg  650 mg Oral Q6H PRN Harrie Foreman, MD   650 mg at 06/24/15 6948   Or  . acetaminophen (TYLENOL) suppository 650 mg  650 mg Rectal Q6H PRN Harrie Foreman, MD      . antiseptic oral rinse solution (CORINZ)  7 mL Mouth Rinse QID Laverle Hobby, MD   7 mL at 06/29/15 0000  . bisacodyl (DULCOLAX) suppository 10 mg  10 mg Rectal Daily PRN Wilhelmina Mcardle, MD      . budesonide (PULMICORT) nebulizer solution 0.5 mg  0.5 mg Nebulization BID Flora Lipps, MD   0.5 mg at 06/29/15 0737  . chlorhexidine gluconate (PERIDEX) 0.12 % solution 15 mL  15 mL Mouth Rinse BID Flora Lipps, MD   15 mL at 06/29/15 0738  . clonazePAM (KLONOPIN) tablet 0.5 mg  0.5 mg Per Tube BID Vilinda Boehringer, MD   0.5 mg at 06/29/15 0904  . collagenase (SANTYL) ointment   Topical Daily Wilhelmina Mcardle, MD      . famotidine (PEPCID) tablet 20 mg  20 mg Per Tube BID Wilhelmina Mcardle, MD   20 mg at 06/29/15 0905  . feeding supplement (PRO-STAT SUGAR FREE 64) liquid  30 mL  30 mL Per Tube BID Laverle Hobby, MD   30 mL at 06/28/15 1648  . feeding supplement (VITAL HIGH PROTEIN) liquid 1,000 mL  1,000 mL Per Tube Continuous Laverle Hobby, MD 60 mL/hr at 06/28/15 0700 1,000 mL at 06/28/15 0700  . fentaNYL (SUBLIMAZE) injection 25-100 mcg  25-100 mcg Intravenous Q2H PRN Wilhelmina Mcardle, MD   100 mcg at 06/28/15 0749  . free water 350 mL  350 mL Per Tube 4 times per day Vishal Mungal, MD   350 mL at 06/29/15 0600  . furosemide (LASIX) injection 20 mg  20 mg Intravenous Q12H Laverle Hobby, MD   20 mg at 06/28/15 2257  . heparin lock flush 100 unit/mL  500 Units Intracatheter Once PRN Lloyd Huger, MD      . heparin lock flush 100 unit/mL  500 Units Intracatheter Once PRN Lloyd Huger, MD      . heparin  lock flush 100 unit/mL  250 Units Intracatheter Once PRN Lloyd Huger, MD      . hydrALAZINE (APRESOLINE) injection 10-20 mg  10-20 mg Intravenous Q4H PRN Wilhelmina Mcardle, MD      . insulin aspart (novoLOG) injection 0-20 Units  0-20 Units Subcutaneous 6 times per day Wilhelmina Mcardle, MD   3 Units at 06/29/15 [phone]  . insulin glargine (LANTUS) injection 20 Units  20 Units Subcutaneous Daily Wilhelmina Mcardle, MD   20 Units at 06/29/15 [phone]  . levalbuterol (XOPENEX) nebulizer solution 0.63 mg  0.63 mg Nebulization Q3H PRN Wilhelmina Mcardle, MD      . levalbuterol [facility]) nebulizer solution 0.63 mg  0.63 mg Nebulization Q6H Wilhelmina Mcardle, MD   0.63 mg at 06/29/15 0737  . losartan (COZAAR) tablet 50 mg  50 mg Oral Daily Laverle Hobby, MD   50 mg at 06/29/15 0904  . metoCLOPramide (REGLAN) injection 5 mg  5 mg Intravenous Q4H PRN Praveen Mannam, MD   5 mg at 06/19/15 0143  . metoprolol (LOPRESSOR) injection 2.5-5 mg  2.5-5 mg Intravenous Q3H PRN Wilhelmina Mcardle, MD   5 mg at 06/27/15 0805  . metoprolol (LOPRESSOR) tablet 100 mg  100 mg Per Tube BID Wilhelmina Mcardle, MD   100 mg at 06/29/15 0905  . midazolam (VERSED) injection 1 mg  1 mg Intravenous Q2H PRN Vilinda Boehringer, MD   1 mg at 06/28/15 1940  . phenol (CHLORASEPTIC) mouth spray 1 spray  1 spray Mouth/Throat PRN Wilhelmina Mcardle, MD      . polyethylene glycol (MIRALAX / GLYCOLAX) packet 17 g  17 g Oral Daily PRN Laverle Hobby, MD      . sodium chloride 0.9 % injection 10 mL  10 mL Intracatheter PRN Lloyd Huger, MD      . sodium chloride 0.9 % injection 10 mL  10 mL Intracatheter PRN Lloyd Huger, MD      . sodium chloride 0.9 % injection 10 mL  10 mL Intracatheter PRN Lloyd Huger, MD      . sodium chloride 0.9 % injection 3 mL  3 mL Intravenous PRN Lloyd Huger, MD      . sodium chloride 0.9 % injection 3 mL  3 mL Intravenous PRN Lloyd Huger, MD      . sodium chloride 0.9 % injection 3 mL  3 mL  Intravenous PRN Lloyd Huger, MD        OBJECTIVE: Filed Vitals:  06/29/15 0800 06/29/15 0900  BP: 148/71 149/79  Pulse: 87 95  Temp: 97.9 F (36.6 C)   Resp: 20 20     Body mass index is 33.35 kg/(m^2).    ECOG FS:4 - Bedbound  General:  intubated Eyes: Pink conjunctiva, anicteric sclera. HEENT:  Trach tube in place. Lungs: Clear to auscultation bilaterally. Heart: Regular rate and rhythm. No rubs, murmurs, or gallops. Abdomen: Soft, nontender, nondistended. No organomegaly noted, normoactive bowel sounds. Musculoskeletal: No edema, cyanosis, or clubbing. Neuro: Alert. Skin: No rashes or petechiae noted.   LAB RESULTS:  Lab Results  Component Value Date   NA 146* 06/29/2015   K 4.2 06/29/2015   CL 109 06/29/2015   CO2 28 06/29/2015   GLUCOSE 125* 06/29/2015   BUN 76* 06/29/2015   CREATININE 0.80 06/29/2015   CALCIUM 8.5* 06/29/2015   PROT 6.8 06/29/2015   ALBUMIN 2.9* 06/29/2015   AST 60* 06/29/2015   ALT 58* 06/29/2015   ALKPHOS 140* 06/29/2015   BILITOT 0.9 06/29/2015   GFRNONAA >60 06/29/2015   GFRAA >60 06/29/2015    Lab Results  Component Value Date   WBC 5.0 06/29/2015   NEUTROABS 8.0* 06/21/2015   HGB 7.5* 06/29/2015   HCT 23.8* 06/29/2015   MCV 92.0 06/29/2015   PLT 175 06/29/2015     STUDIES: Dg Chest 1 View  06/24/2015  CLINICAL DATA:  Acute respiratory failure, right pleural effusion, history of COPD, CHF, diabetes, chronic renal insufficiency, and current smoker. EXAM: CHEST 1 VIEW COMPARISON:  Portable chest x-ray of June 22, 2015 FINDINGS: The left lung is well-expanded. There is stable left lower lobe atelectasis with obscuration of the hemidiaphragm. On the right diffusely increased interstitial and alveolar opacities persist. The hemidiaphragm remains obscured. Cardiac silhouette is enlarged but its right border is indistinct. The pulmonary vascularity is mildly prominent though stable. A tracheostomy appliance tube has its tip  at the level of the inferior margin of the clavicular heads which lies 6.4 cm above the carina. The esophagogastric tube tip projects below the inferior margin of the image. The right-sided PICC line tip projects over the junction of the proximal and midportions of the SVC. IMPRESSION: Slight interval deterioration in the appearance of the left lower lobe. Persistent widespread interstitial and alveolar opacities on the right consistent with pneumonia or other alveolar filling process. There is no pneumothorax. Layering of pleural fluid posteriorly on the right is suspected. Electronically Signed   By: David  Martinique M.D.   On: 06/24/2015 07:42   Dg Chest 1 View  06/15/2015  CLINICAL DATA:  Tracheostomy tube placement EXAM: CHEST 1 VIEW COMPARISON:  06/15/2015 FINDINGS: Tracheostomy tube as been placed. The tip is 7.6 cm from the carina. Large right pleural effusion and associated pulmonary opacity are stable. Opacity at the left base is stable. Borderline cardiomegaly. No pneumothorax. Right upper extremity PICC is stable. IMPRESSION: Tracheostomy tube as been placed as described Stable large right pleural effusion and bilateral pulmonary opacity. Electronically Signed   By: Marybelle Killings M.D.   On: 05/27/2015 19:18   Dg Chest 1 View  06/15/2015  CLINICAL DATA:  Dyspnea. History of COPD, congestive heart failure, hypertension, chronic kidney disease. EXAM: CHEST 1 VIEW COMPARISON:  06/12/2015 FINDINGS: Endotracheal tube, enteric catheter, right PICC line terminating in the expected location of proximal superior vena cava are stable. Cardiomediastinal silhouette is borderline enlarged. Mediastinal contours appear intact. The ureter demonstrates atherosclerotic calcifications of the arch. There is no evidence of pneumothorax. There is  slight improvement of the aeration of the right lung with large right pleural effusion. Left lower lobe atelectasis is noted. Osseous structures are without acute abnormality.  Soft tissues are grossly normal. IMPRESSION: Slight improvement of the aeration of the right lung, with persistent large right pleural effusion, an diffuse haziness of the visualized lung parenchyma. Atelectasis of the left lower lobe. Electronically Signed   By: Fidela Salisbury M.D.   On: 06/15/2015 09:12   Dg Chest 1 View  05/31/2015  CLINICAL DATA:  Dyspnea. EXAM: CHEST 1 VIEW COMPARISON:  05/30/2015 FINDINGS: ET tube tip is above the carina. There is a nasogastric tube in place. Right arm PICC line tip is in the SVC. Loculated right pleural effusion is unchanged from previous exam. Cardiac enlargement and aortic atherosclerosis again noted. IMPRESSION: 1. No change and large loculated right pleural effusion. 2. Cardiac enlargement and aortic atherosclerosis. Electronically Signed   By: Kerby Moors M.D.   On: 05/31/2015 09:31   Dg Abd 1 View  06/29/2015  CLINICAL DATA:  Patient has a naso gastic tube in place. HX copd, chf, htn, diabetes, ckd EXAM: ABDOMEN - 1 VIEW COMPARISON:  06/22/2015 and 05/30/2015. FINDINGS: 0601 hours. Two views obtained. Nasogastric tube projects over the left upper quadrant of the abdomen consistent with position in the mid stomach. The abdomen is incompletely visualized due to body habitus. The bowel gas pattern appears nonobstructive. There is no evidence of free intraperitoneal air or suspicious calcification. Degenerative changes are noted within the spine associated with a mild convex left scoliosis. IMPRESSION: Nasogastric tube appears unchanged at the level of the mid stomach. Electronically Signed   By: Richardean Sale M.D.   On: 06/29/2015 08:11   Dg Abd 1 View  06/06/2015  CLINICAL DATA:  69 year old female status post nasogastric tube placement EXAM: ABDOMEN - 1 VIEW COMPARISON:  Prior abdominal radiograph 06/09/2015 FINDINGS: A nasogastric tube projects over the left lower quadrant in the region of the mid stomach (based on prior imaging). The bowel gas  pattern is otherwise nonspecific. Opacification of the right lung base consistent with known large right-sided pleural effusion. No acute osseous abnormality. IMPRESSION: Well-positioned nasogastric tube overlying the gastric body. Electronically Signed   By: Jacqulynn Cadet M.D.   On: 06/08/2015 16:12   Dg Abd 1 View  06/06/2015  CLINICAL DATA:  69 year old female with abdominal distention and diarrhea. Initial encounter. EXAM: ABDOMEN - 1 VIEW COMPARISON:  06/02/2015 and earlier. FINDINGS: 2 Portable AP supine views at 1525 hours. Large body habitus. Improved bowel gas pattern since 05/30/2015, although increased gas-filled small and large bowel loops since 06/02/2015. Still, no dilated small bowel loops present. Large bowel caliber is at the upper limits of normal. Enteric tube remains in place. The side hole appears to be at the level of the diaphragm. Stable visualized osseous structures. IMPRESSION: 1. Increased gas filled large and small bowel loops since 06/02/2015 most suggestive of ileus. 2. Enteric tube in place, but side hole at the level of the distal thoracic esophagus. Advance 6 cm to place the side hole within the stomach. Electronically Signed   By: Genevie Ann M.D.   On: 06/06/2015 15:41   Dg Abd 1 View  06/02/2015  CLINICAL DATA:  Status post OG tube placement. EXAM: ABDOMEN - 1 VIEW COMPARISON:  None. FINDINGS: OG tube is in place with the tip in the stomach. The tube could be advanced 3-4 cm for better positioning. IMPRESSION: As above. Electronically Signed   By: Marcello Moores  Dalessio M.D.   On: 06/02/2015 15:21   Dg Abd 1 View  06/02/2015  CLINICAL DATA:  Orogastric tube placement. EXAM: ABDOMEN - 1 VIEW COMPARISON:  Earlier today at 0800 hours. FINDINGS: Motion and patient body habitus degradation. Nasogastric tube terminates at the body of the stomach. The side-port may be above the gastroesophageal junction. No gross free intraperitoneal air. Partial right hemi thorax opacification,  incompletely imaged. IMPRESSION: Degraded exam, as detailed above. Nasogastric terminating at the body of the stomach. Electronically Signed   By: Abigail Miyamoto M.D.   On: 06/02/2015 09:28   Dg Abd 1 View  06/02/2015  CLINICAL DATA:  NG tube placement . EXAM: ABDOMEN - 1 VIEW COMPARISON:  05/30/2015 . FINDINGS: NG tube tip noted at the level of the gastroesophageal junction. More distal placement should be considered. Persistent bowel distention. No free air . IMPRESSION: NG tube tip noted at the level of the gastroesophageal junction. More distal placement should be considered.These results will be called to the ordering clinician or representative by the Radiologist Assistant, and communication documented in the PACS or zVision Dashboard. Electronically Signed   By: Marcello Moores  Register   On: 06/02/2015 08:19   Dg Abd 1 View  05/30/2015  CLINICAL DATA:  Lung cancer.  Distended abdomen. EXAM: ABDOMEN - 1 VIEW COMPARISON:  05/20/2015 FINDINGS: Mild gaseous distention of the stomach and transverse colon. No small bowel dilatation to suggest obstruction. No organomegaly. No free air. IMPRESSION: Mild gaseous distention of the stomach and transverse colon. This may reflect mild ileus. Electronically Signed   By: Rolm Baptise M.D.   On: 05/30/2015 16:42   Ct Head Wo Contrast  06/20/2015  CLINICAL DATA:  Lung cancer.  Diabetes.  CHF.  Oxygen desaturation. EXAM: CT HEAD WITHOUT CONTRAST TECHNIQUE: Contiguous axial images were obtained from the base of the skull through the vertex without intravenous contrast. COMPARISON:  None FINDINGS: Sinuses/Soft tissues: Intubation. Bilateral mastoid effusions. Paranasal sinuses clear. Intracranial: Moderate motion degradation throughout. Moderate low density in the periventricular white matter likely related to small vessel disease. Cerebral atrophy which is age advanced. Carotid and vertebral artery atherosclerosis. Given motion limitation, no mass lesion, hemorrhage,  hydrocephalus, acute infarct, intra-axial, or extra-axial fluid collection. IMPRESSION: 1. Moderate motion degraded exam. 2.  Cerebral atrophy and small vessel ischemic change. 3. No definite acute intracranial abnormality. Low sensitivity for intracranial metastasis secondary to noncontrast technique and motion. 4. Bilateral mastoid effusions. Electronically Signed   By: Abigail Miyamoto M.D.   On: 06/20/2015 15:33   Ct Chest W Contrast  06/06/2015  CLINICAL DATA:  Followup chest radiograph. History of CHF, COPD and hypertension. EXAM: CT CHEST WITH CONTRAST TECHNIQUE: Multidetector CT imaging of the chest was performed during intravenous contrast administration. CONTRAST:  22m OMNIPAQUE IOHEXOL 300 MG/ML  SOLN COMPARISON:  05/20/2015 FINDINGS: Mediastinum: The heart size is normal. No pericardial effusion. There is an endotracheal tube with tip above the carina. The nasogastric tube is present with tip in the body of the stomach. The index right supraclavicular lymph node measures 1.5 cm, image 5 of series 2. Previously 2.6 cm. Right paratracheal lymph node measure 2.0 cm, image 19 of series 2. Previously 3.3 cm. Pre-vascular lymph node measures 1.6 cm, image 25 of series 2. Previously 2.3 cm. Lungs/Pleura: There is a moderate size partially loculated right pleural effusion which has decreased in volume from previous exam. There has been interval improvement in aeration to the right upper lobe and superior segment of right lower lobe  compatible with decrease in size of obstructing central right lung mass. Continued complete atelectasis and consolidation of the right middle lobe. The right lung mass is difficult to visualize and accurately remeasure. Mild atelectasis is noted within the posterior and medial left lower lobe. Upper Abdomen: Stable appearance of left adrenal nodule measuring 2.9 x 1.3 cm. The right adrenal gland is normal. Low to intermediate attenuation structure within the posterior right hepatic  lobe measures 2.6 cm, image 57 of series 2. Unchanged from previous exam. There is mild perihepatic ascites noted. Musculoskeletal: Scoliosis deformity involves the thoracic spine. IMPRESSION: 1. Interval response to therapy. Since the previous exam there has been improved aeration to the right upper lobe and superior segment of the right lower lobe. Findings are compatible with decrease in size of central obstructing right perihilar lesion 2. Decrease in size of mediastinal and right supraclavicular adenopathy. 3. No new or progressive disease identified. Electronically Signed   By: Kerby Moors M.D.   On: 06/06/2015 10:39   [place] Chest  06/02/2015  CLINICAL DATA:  Pleural effusion.  Lung cancer. EXAM: CHEST ULTRASOUND COMPARISON:  Chest x-ray 06/02/2015. FINDINGS: Moderate right-sided pleural effusion noted. IMPRESSION: Moderate right pleural effusion . Electronically Signed   By: Marcello Moores  Register   On: 06/02/2015 10:05   Dg Chest Port 1 View  06/29/2015  CLINICAL DATA:  Ventilator patient. EXAM: PORTABLE CHEST 1 VIEW COMPARISON:  06/24/2015 and 06/22/2015. FINDINGS: 0557 hours. The tracheostomy, nasogastric tube and right arm PICC appear unchanged. The diffuse right lung airspace opacities appear mildly improved. There is stable left basilar opacity and small bilateral pleural effusions. No evidence of pneumothorax. The heart size and mediastinal contours are stable. IMPRESSION: Slight improvement in right lung airspace opacities. Stable support system. Electronically Signed   By: Richardean Sale M.D.   On: 06/29/2015 08:17   Dg Chest Port 1 View  06/22/2015  CLINICAL DATA:  69 year old female with history of respiratory failure. EXAM: PORTABLE CHEST 1 VIEW COMPARISON:  Chest x-ray 06/20/2015. FINDINGS: Tracheostomy tube in position with tip approximately 6.6 cm above the carina. A nasogastric tube is seen extending into the stomach, however, the tip of the nasogastric tube extends below the lower margin  of the image. There is a right upper extremity PICC with tip terminating in the proximal superior vena cava. Lung volumes appear normal. Large right pleural effusion slightly decreased compared to the prior examination. Multifocal opacities throughout the right mid to lower lung and in the medial left lung base. No definite left pleural effusion. No evidence of pulmonary edema. Heart size appears borderline enlarged. The patient is rotated to the left on today's exam, resulting in distortion of the mediastinal contours and reduced diagnostic sensitivity and specificity for mediastinal pathology. Atherosclerosis in the thoracic aorta. IMPRESSION: 1. Support apparatus, as above. 2. Slight decreased size of large right pleural effusion. Extensive areas of atelectasis and/or consolidation throughout the right mid to lower lung, and additionally in the medial left lung base. 3. Atherosclerosis. Electronically Signed   By: Vinnie Langton M.D.   On: 06/22/2015 14:10   Dg Chest Port 1 View  06/20/2015  CLINICAL DATA:  Ventilator dependent respiratory failure. Hypoxia. Followup right pleural effusion and postobstructive atelectasis/pneumonia in the right lower lobe and right middle lobe. EXAM: PORTABLE CHEST 1 VIEW COMPARISON:  06/19/2015 and earlier, including CT chest 06/06/2015. FINDINGS: Endotracheal tube tip in satisfactory position projecting approximately 6 cm above the carina. Right arm PICC tip projects at the junction of the  right innominate vein and SVC, unchanged. Nasogastric tube courses below the diaphragm into the stomach. Since the 1114 hr examination yesterday, no change in the dense airspace consolidation in the right middle lobe and right lower lobe and the large right pleural effusion. Prominent bronchovascular markings in the left lung, unchanged, with improvement in the pulmonary venous hypertension. No new pulmonary parenchymal abnormalities. IMPRESSION: 1. Support apparatus satisfactory. 2. No  change since yesterday in the dense atelectasis/pneumonia involving the right middle lobe and right lower lobe. 3. No change in the large right pleural effusion. 4. No new abnormalities. Electronically Signed   By: Evangeline Dakin M.D.   On: 06/20/2015 08:31   Dg Chest Port 1 View  06/19/2015  CLINICAL DATA:  Respiratory failure. Small cell carcinoma the lung. Right pleural effusion. EXAM: PORTABLE CHEST 1 VIEW 11:13 a.m. COMPARISON:  Radiographs from 06/08/2015 through 06/19/2015 at 5:42 a.m. and CT scan of the chest 06/06/2015 FINDINGS: The large right pleural effusion has decreased since the prior exam. PICC tip in the upper superior vena cava. Tracheostomy tube in good position. NG tube tip below the diaphragm. Left lung is clear. Pulmonary vascularity is normal. Right mediastinal adenopathy is noted. IMPRESSION: Decreased large right pleural effusion.  No pneumothorax. Electronically Signed   By: Lorriane Shire M.D.   On: 06/19/2015 11:29   Dg Chest Port 1 View  06/19/2015  CLINICAL DATA:  Respiratory failure with increasing difficulty breathing. History of COPD, diabetes and lung cancer. EXAM: PORTABLE CHEST 1 VIEW COMPARISON:  Radiographs 06/18/2015 and 06/18/2015.  CT 06/06/2015. FINDINGS: 0954 hour. Right arm PICC has been slightly advanced to the level of the upper SVC. Tracheostomy and nasogastric tube appear unchanged. There is progressive volume loss and opacification in the right hemithorax consistent with progressive partial lung collapse. Underlying right pleural effusion appears unchanged. There is improved aeration of the left lung. The heart size appears unchanged. IMPRESSION: Progressive partial right lung collapse with increased pleural parenchymal opacities on the right hemithorax. Support system positioned as above. Electronically Signed   By: Richardean Sale M.D.   On: 06/19/2015 10:11   Dg Chest Port 1 View  06/18/2015  CLINICAL DATA:  Respiratory failure. EXAM: PORTABLE CHEST 1  VIEW COMPARISON:  06/06/2015. FINDINGS: Tracheostomy tube, NG tube, right PICC line stable position. PICC line tip projected over the right subclavian vein . Persistent cardiomegaly. Persistent bilateral pulmonary infiltrates particularly prominent on the right. Persistent bilateral pleural effusions. No interim improvement. No pneumothorax. IMPRESSION: 1. Lines and tubes in stable position. 2. Persistent cardiomegaly with unchanged bilateral pulmonary infiltrates, particular prominent on the right. Persistent bilateral pleural effusions. Findings are consistent with congestive heart failure with asymmetric pulmonary edema. Pneumonia, particularly on the right cannot be excluded. Electronically Signed   By: Marcello Moores  Register   On: 06/18/2015 07:53   Dg Chest Port 1 View  06/08/2015  CLINICAL DATA:  Respiratory failure. EXAM: PORTABLE CHEST 1 VIEW COMPARISON:  06/12/2015. FINDINGS: Tracheostomy tube, NG tube, right PICC line in stable position. Cardiomegaly with pulmonary vascular prominence and bilateral pulmonary alveolar infiltrates particularly the right. Findings suggest congestive heart failure with asymmetric pulmonary edema. Pneumonia cannot be excluded. No pneumothorax. IMPRESSION: 1. Lines and tubes in stable position. 2. Cardiomegaly with persistent bilateral pulmonary infiltrates, particularly prominent on the right. Persistent right pleural effusion. Findings suggest congestive heart failure with asymmetric pulmonary edema. Pneumonia cannot be excluded. No interim change from prior exam . Electronically Signed   By: [place]   On: 06/11/2015  07:28   Dg Chest Port 1 View  06/12/2015  CLINICAL DATA:  Short of breath. EXAM: PORTABLE CHEST 1 VIEW COMPARISON:  06/09/2015 FINDINGS: Endotracheal tube remains in good position. Right arm PICC tip in the proximal SVC unchanged. NG tube in place with the tip not visualized Complete opacification of the right chest which has progressed since the prior  study. This is likely due to pleural effusion and collapse. Underlying pneumonia or tumor not excluded. Cardiac enlargement. Vascular congestion and on the left without pulmonary edema or effusion on the left. IMPRESSION: Endotracheal tube remains in good position Complete opacification of the right chest due to enlarging effusion and collapse of the right lung. Electronically Signed   By: Franchot Gallo M.D.   On: 06/12/2015 07:44   Dg Chest Port 1 View  06/09/2015  CLINICAL DATA:  Intubation and OG tube placement EXAM: PORTABLE CHEST 1 VIEW COMPARISON:  1 day prior FINDINGS: Endotracheal tube terminates 4.1 cm above carina.Nasogastric tube extends beyond the inferior aspect of the film. A right-sided PICC line is poorly visualized centrally but likely terminates at the low SVC. The patient is minimally rotated left. Cardiomegaly accentuated by AP portable technique. Layering right pleural effusion. No pneumothorax. Interstitial edema is asymmetric and worse on the right. Lower lobe predominant right worse than left airspace disease is minimally improved. IMPRESSION: Minimal improvement in aeration since the exam of 1 day prior. Congestive heart failure with asymmetric interstitial and airspace opacities, worse on the right. Concurrent infection cannot be excluded. Layering right pleural effusion. Electronically Signed   By: Abigail Miyamoto M.D.   On: 06/09/2015 12:09   Dg Chest Port 1 View  06/08/2015  CLINICAL DATA:  Respiratory failure.  Followup exam. EXAM: PORTABLE CHEST 1 VIEW COMPARISON:  Chest CT and chest radiographs, 06/06/2015. FINDINGS: There is near complete opacification of the right hemi thorax consistent with combination of atelectasis/ consolidation and pleural fluid. There is, however, some more aeration of the mid to upper lung on the right than there was on the prior chest radiographs. Left lung is essentially clear.  No pneumothorax. Right-sided PICC is stable. Endotracheal tube and  nasogastric tube have been removed. IMPRESSION: 1. Mild improvement from prior study with some more aeration evident of the right mid and upper lung. This is consistent with a decrease in lung consolidation/atelectasis. There is still significant opacification throughout most of the right hemi thorax. No new abnormalities. Electronically Signed   By: Lajean Manes M.D.   On: 06/08/2015 08:14   Dg Chest Port 1 View  06/06/2015  CLINICAL DATA:  Respiratory failure . EXAM: PORTABLE CHEST 1 VIEW COMPARISON:  06/02/2015.  CT 05/20/2015 . FINDINGS: Endotracheal tube 8 cm above the carina at the thoracic inlet. Slight interim advancement should be considered. NG tube and right PICC line in stable position. Progressive consolidation/ atelectasis of the right lung and progressive right pleural effusion. Right pleural effusion is large. Left lung is clear. Stable cardiomegaly. IMPRESSION: 1. Endotracheal tube 8 cm above the carina at the thoracic inlet. Slightly mid aspect should be considered. NG tube and right PICC line stable position. 2. Progression of consolidation/atelectasis of the right lung with progression of the right pleural effusion. The right pleural effusion is large. 3. Stable cardiomegaly. Electronically Signed   By: Marcello Moores  Register   On: 06/06/2015 07:11   Dg Chest Port 1 View  06/02/2015  CLINICAL DATA:  69 year old female with a history of small cell lung cancer EXAM: PORTABLE CHEST 1  VIEW COMPARISON:  Multiple prior plain film, including 05/31/2015, limb 10/2014, 05/29/2015, CT chest 05/20/2015 FINDINGS: Cardiomediastinal silhouette obscured by overlying lung/ pleural disease. Unchanged position of endotracheal tube, terminating suitably above the carina, 4.5 cm. Unchanged gastric tube, terminating out of the field of view. Unchanged right upper extremity PICC, which terminates at the level the right mainstem bronchus. Overlying EKG leads and ventilator tubing. Atherosclerosis of the aortic arch.  Dense opacity of the right lung persists, with obscuration the right hemidiaphragm, right heart borders, and the majority of the right lung. Minimal maintained aeration at the right apex. Airspace opacity at the left base persists, with similar aeration to the prior. No pneumothorax. IMPRESSION: Similar appearance of dense right-sided airspace opacity, likely a combination of lung consolidation, atelectasis, tumor tissue, and pleural effusion. Unchanged position of support apparatus, as above. Airspace disease at the left base, similar to the comparison, likely combination of atelectasis, edema, consolidation. Small left pleural effusion not excluded. Atherosclerosis. Signed, Dulcy Fanny. Earleen Newport, DO [facility] Electronically Signed   By: Corrie Mckusick D.O.   On: 06/02/2015 08:23   Dg Abd Portable 1v  06/22/2015  CLINICAL DATA:  NG tube residuals.  Check placement EXAM: PORTABLE ABDOMEN - 1 VIEW COMPARISON:  Radiograph 06/23/2015 FINDINGS: NG tube extends into the stomach. The side port is below the GE junction. Tip is below the margin of film. Second film was provided which demonstrates tip within the gastric body. Bilateral basilar effusions and atelectasis. IMPRESSION: NG tube appears in proper position. Electronically Signed   By: Suzy Bouchard M.D.   On: 06/22/2015 09:29   Dg Abd Portable 1v  06/09/2015  CLINICAL DATA:  OG tube placement EXAM: PORTABLE ABDOMEN - 1 VIEW COMPARISON:  06/06/2015 FINDINGS: The nasogastric tube tip is in the body of stomach. Side port is well below the GE junction. Mild gaseous distension of large and small bowel loops unchanged. IMPRESSION: 1. NG tube tip is in the body of stomach. Electronically Signed   By: Kerby Moors M.D.   On: 06/09/2015 12:04    ASSESSMENT:  Small cell lung cancer.   PLAN:    1. Small cell lung cancer: Patient is likely stage IV given her suspicious adrenal lesion. Recent CT of her  head on June 20, 2015 did not report any metastatic disease.  CT scan results from June 06, 2015 revealed a positive response to cycle 1 of treatment with decreased size of her mass. Her persistent requirement for mechanical ventilation may more likely be secondary to poor lung function, pleural effusion, and COPD than external compression of her mass at this point. Patient completed cycle 2 of cisplatin and etoposide last week. Cycle 3 will be scheduled in 3-4 weeks. She will ultimately require port placement once she is off the ventilator. XRT would be helpful, but there are patient safety concerns transporting to radiation oncology.  Consult radiation oncology for further input and evaluation. 2. Thrombocytopenia: Platelet count is now within normal limits. Monitor. Monitor daily CBC as patient's platelet count may decrease secondary to chemotherapy. 3. Anemia:  Likely secondary to chemotherapy. Continue to transfuse if hemoglobin falls below 7.0.  4. White blood cell count: Currently within normal limits. Patient became neutropenic secondary to chemotherapy after her first cycle of chemotherapy. Will continue to monitor blood count and initiate Neupogen if necessary. 5. Tracheostomy:  Appreciate ENT input. Tracheostomy in place.  6. Disposition: Long-term ventilation and tracheostomy facilities have been discussed, but this poses  a problem for transportation back and forth for chemotherapy. Case previously discussed with case management as well as long-term facility representative.   Will follow.   Lloyd Huger, MD   06/29/2015 10:21 AM

## 2015-06-29 NOTE — Progress Notes (Signed)
* [facility]  69 year old female with new diagnosis of small cell lung cancer, subsequently intubated due to right lung atelectasis and respiratory failure, because of the lung cancer. She is now status post 2 cycles of chemotherapy to see if this tumor can be shrunk.   Assessment and Plan:  Prolonged VDRF -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, chemotherapy; Significant improvement in airway patency by bronchoscopy 11/22. -Continued ventilator dependence, status post tracheostomy.  Right lung, small cell lung cancer. -The patient did have some response with opening of the right mainstem airway. With the first course of chemotherapy. Her course has been, complicated by severe debility, deconditioning, severe mucus plugging and overall continued general decline in her status. -After this course of chemotherapy. We will need to reevaluate the patient and see if she may be a candidate to go to long-term acute care facility, and potentially receive more chemotherapy or to change the patient to comfort measures only. -Patient may require PEG tube placement, before her platelets after this round of chemotherapy starts to drop again -Hematology/oncology currently following, currently on cycle 2 of cisplatin and etoposide -Patient would benefit from radiation therapy, but there is concern for possible patient's safety transportation issues to radiation oncology.  COPD  -Cont nebulized steroids Cont nebulized bronchodilators.  PSVT  -Cont scheduled enteral metoprolol  Anemia  - improving  Thrombocytopenia  - improving  Agitated, delirium. Severe debility and deconditioning.  Volume overload Large TF residuals - resolved  Sinusitis -Completed 5 day course.   ?Paraneoplastic Syndrome - slowly improving -Has severe weakness ? [name].  Thrush -Completing a 3 day course of diflucan.      Monitor BMET intermittently, check CE Monitor I/Os DVT px:  SCDs Monitor CBC intermittently Transfuse per usual guidelines   Date: 06/29/2015  MRN# [id number] ALIE MOUDY 10/30/1945   Terrilyn Tyner Eckart is a 69 y.o. old female seen in follow up for chief complaint of  Chief Complaint  Patient presents with  . Respiratory Distress     HPI/ROS: The patient is currently intubated and sedated and cannot provide history or review of systems. Awake this morning, states that she is in no pain, no further complaints  Allergies:  Atorvastatin and Rosuvastatin    Physical Examination:   VS: BP 135/71 mmHg  Pulse 80  Temp(Src) 98.2 F (36.8 C) (Axillary)  Resp 17  Ht '5\' 7"'$  (1.702 m)  Wt 212 lb 15.4 oz (96.6 kg)  BMI 33.35 kg/m2  SpO2 97%  General Appearance: No distress  Neuro:without focal findings, bilateral extremity strength is 2 out of 5, bilateral lower extremity strength is 1 out of 5. HEENT: PERRLA, EOM intact. Pulmonary: Decreased breath sounds  on the right,  mild scattered bilateral wheezing (baseline for her now) CardiovascularNormal S1,S2.  No m/r/g.   Abdomen: Benign, Soft, non-tender. Renal:  No costovertebral tenderness  GU:  Not performed at this time. Endoc: No evident thyromegaly, no signs of acromegaly. Skin:   warm, no rash. Extremities: normal, no cyanosis, clubbing.   LABORATORY PANEL:   CBC  Recent Labs Lab 06/29/15 0702  WBC 5.0  HGB 7.5*  HCT 23.8*  PLT 175   ------------------------------------------------------------------------------------------------------------------  Chemistries   Recent Labs Lab 06/24/15 0500  06/29/15 0702  NA 150*  < > 146*  K 3.4*  < > 4.2  CL 113*  < > 109  CO2 30  < > 28  GLUCOSE 139*  < > 125*  BUN 40*  < > 76*  CREATININE 0.81  < > 0.80  CALCIUM 8.3*  < > 8.5*  MG 1.9  --   --   AST  --   --  60*  ALT  --   --  58*  ALKPHOS  --   --  140*  BILITOT  --   --  0.9  < > = values in this interval not  displayed. ------------------------------------------------------------------------------------------------------------------  Cardiac Enzymes No results for input(s): TROPONINI in the last 168 hours. ------------------------------------------------------------  RADIOLOGY:   No results found for this or any previous visit. No results found for this or any previous visit. ------------------------------------------------------------------------------------------------------------------  Thank  you for allowing [facility], Critical Care to assist in the care of your patient. Our recommendations are noted above.  Please contact us if we can be of further service.  Vilinda Boehringer, MD  Pulmonary and Critical Care Pager [phone] (please enter 7-digits) On Call Pager - [phone] (please enter 7-digits)   [place].  I have personally obtained a history, examined the patient, evaluated laboratory and imaging results, formulated the assessment and plan and placed orders. The case was discussed in detail with the critical care RN, critical care pharmacist, nutrition, case manager, unit manager, and respiratory therapist. The Patient requires high complexity decision making for assessment and support, frequent evaluation and titration of therapies, application of advanced monitoring technologies and extensive interpretation of multiple databases. The patient has critical illness that could lead imminently to failure of 1 or more organ systems and requires the highest level of physician preparedness to intervene.  Critical Care Time devoted to patient care services described in this note is 35 minutes and is exclusive of time spent in procedures.

## 2015-06-29 NOTE — Progress Notes (Signed)
Pt has tried repeatedly to communciate with nursing staff today and has become frustrated when we can not always understand her. She mouths words, but will not use communication board and no family has been here to assist. At one point today she began throwing legs over side of bed , medication given with good effect. Bed ROM exercises encouraged to build up strength, large urine output 2350 so far this shift. VSS

## 2015-06-29 NOTE — Consult Note (Signed)
Electrolyte CONSULT NOTE - FOLLOW UP  Pharmacy Consult for Electrolyte monitoring and replacement  Patient Measurements: Height: '5\' 7"'$  (170.2 cm) Weight: 212 lb 15.4 oz (96.6 kg) IBW/kg (Calculated) : 61.6  Vital Signs: Temp: 97.9 F (36.6 C) (12/04 0800) Temp Source: Axillary (12/04 0400) BP: 148/71 mmHg (12/04 0800) Pulse Rate: 87 (12/04 0800)  Recent Labs  06/27/15 0500 06/28/15 0500 06/29/15 0702  NA 146* 149* 146*  K 4.5 4.1 4.2  CL 113* 113* 109  CO2 '25 28 28  '$ GLUCOSE 214* 137* 125*  BUN 83* 88* 76*  CREATININE 0.87 0.86 0.80  CALCIUM 8.2* 8.6* 8.5*  PROT  --   --  6.8  ALBUMIN  --   --  2.9*  AST  --   --  60*  ALT  --   --  58*  ALKPHOS  --   --  140*  BILITOT  --   --  0.9   Estimated Creatinine Clearance: 81.4 mL/min (by C-G formula based on Cr of 0.8).   Recent Labs  06/28/15 2335 06/29/15 0337 06/29/15 0731  GLUCAP 134* 124* 127*    Assessment: 69 yo female with SCLC current;y intubated in ICU. Pharmacy consulted for monitoring and managing electrolytes.   Plan:  Electrolytes are wnl. Will f/u AM labs.   Ulice Dash, PharmD  06/29/2015

## 2015-06-29 NOTE — Progress Notes (Signed)
Assumed pt care.

## 2015-06-29 NOTE — Discharge Instructions (Signed)

## 2015-06-30 LAB — BASIC METABOLIC PANEL
ANION GAP: 8 (ref 5–15)
BUN: 74 mg/dL — ABNORMAL HIGH (ref 6–20)
CHLORIDE: 110 mmol/L (ref 101–111)
CO2: 31 mmol/L (ref 22–32)
Calcium: 8.8 mg/dL — ABNORMAL LOW (ref 8.9–10.3)
Creatinine, Ser: 0.78 mg/dL (ref 0.44–1.00)
GFR calc non Af Amer: >60 mL/min (ref >60)
GLUCOSE: 149 mg/dL — AB (ref 65–99)
Potassium: 3.6 mmol/L (ref 3.5–5.1)
Sodium: 149 mmol/L — ABNORMAL HIGH (ref 135–145)

## 2015-06-30 LAB — GLUCOSE, CAPILLARY
Glucose-Capillary: 113 mg/dL — ABNORMAL HIGH (ref 65–99)
Glucose-Capillary: 131 mg/dL — ABNORMAL HIGH (ref 65–99)
Glucose-Capillary: 138 mg/dL — ABNORMAL HIGH (ref 65–99)
Glucose-Capillary: 146 mg/dL — ABNORMAL HIGH (ref 65–99)
Glucose-Capillary: 150 mg/dL — ABNORMAL HIGH (ref 65–99)

## 2015-06-30 LAB — MAGNESIUM: Magnesium: 1.3 mg/dL — ABNORMAL LOW (ref 1.7–2.4)

## 2015-06-30 LAB — PHOSPHORUS: PHOSPHORUS: 4.4 mg/dL (ref 2.5–4.6)

## 2015-06-30 MED ORDER — DEXMEDETOMIDINE HCL IN NACL 400 MCG/100ML IV SOLN
0.4000 ug/kg/h | INTRAVENOUS | Status: DC
  Administered 2015-06-30: 0.5 ug/kg/h via INTRAVENOUS
  Administered 2015-06-30: 0.6 ug/kg/h via INTRAVENOUS
  Administered 2015-07-01: 0.5 ug/kg/h via INTRAVENOUS
  Administered 2015-07-01 – 2015-07-03 (×10): 1 ug/kg/h via INTRAVENOUS
  Administered 2015-07-04 (×3): 1.2 ug/kg/h via INTRAVENOUS
  Administered 2015-07-04: 1 ug/kg/h via INTRAVENOUS
  Administered 2015-07-04 – 2015-07-05 (×4): 1.2 ug/kg/h via INTRAVENOUS
  Filled 2015-06-30 (×26): qty 100

## 2015-06-30 MED ORDER — MAGNESIUM SULFATE 2 GM/50ML IV SOLN
2.0000 g | Freq: Once | INTRAVENOUS | Status: AC
  Administered 2015-06-30: 2 g via INTRAVENOUS
  Filled 2015-06-30: qty 50

## 2015-06-30 MED ORDER — FUROSEMIDE 10 MG/ML IJ SOLN
20.0000 mg | Freq: Every day | INTRAMUSCULAR | Status: DC
  Administered 2015-07-01 – 2015-07-04 (×4): 20 mg via INTRAVENOUS
  Filled 2015-06-30 (×4): qty 2

## 2015-06-30 NOTE — Progress Notes (Signed)
* [facility] [facility]  69 year old female with new diagnosis of small cell lung cancer, subsequently intubated due to right lung atelectasis and respiratory failure, because of the lung cancer. She is now status post 2 cycles of chemotherapy to see if this tumor can be shrunk.   Assessment and Plan:  Prolonged VDRF -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, chemotherapy; Significant improvement in airway patency by bronchoscopy 11/22. -Continued ventilator dependence, status post tracheostomy. -We'll attempt with ventilator weaning today.  Right lung, small cell lung cancer. -The patient did have some response with opening of the right mainstem airway. With the first course of chemotherapy. Her course has been, complicated by severe debility, deconditioning, severe mucus plugging and overall continued general decline in her status. -After this course of chemotherapy. We will need to reevaluate the patient and see if she may be a candidate to go to long-term acute care facility, and potentially receive more chemotherapy or to change the patient to comfort measures only. -Patient may require PEG tube placement, before her platelets after this round of chemotherapy starts to drop again -Hematology/oncology currently following, has now completed cycle 2 of cisplatin and etoposide -Patient would benefit from radiation therapy, but there is concern for possible patient's safety transportation issues to radiation oncology. -Discussed disposition with the multidisciplinary team, the patient now appears stable for consideration of transfer to an [place] facility. She will require placement of a PEG tube, will consult gastroenterology for PEG tube placement.  COPD  -Cont nebulized steroids Cont nebulized bronchodilators.  PSVT  -Cont scheduled enteral metoprolol  Anemia  - improving  Thrombocytopenia  - improving  Agitated, delirium. Severe debility and deconditioning.  Volume  overload Large TF residuals - resolved  Sinusitis -Completed 5 day course.   ?Paraneoplastic Syndrome - slowly improving -Has severe weakness ? [name].  Thrush -Completing a 3 day course of diflucan.      Monitor BMET intermittently, check CE Monitor I/Os DVT px: SCDs Monitor CBC intermittently Transfuse per usual guidelines   Date: 06/30/2015  MRN# [id number] VERGENE MARLAND Oct 16, 1945   Jayden Rudge Stalder is a 69 y.o. old female seen in follow up for chief complaint of  Chief Complaint  Patient presents with  . Respiratory Distress     HPI/ROS: The patient is currently intubated and sedated and cannot provide history or review of systems. Awake this morning, states that she is in no pain, no further complaints  Allergies:  Atorvastatin and Rosuvastatin    Physical Examination:   VS: BP 148/74 mmHg  Pulse 95  Temp(Src) 99.1 F (37.3 C) (Oral)  Resp 18  Ht '5\' 7"'$  (1.702 m)  Wt 93.895 kg (207 lb)  BMI 32.41 kg/m2  SpO2 100%  General Appearance: No distress  Neuro:without focal findings, bilateral extremity strength is 2 out of 5, bilateral lower extremity strength is 1 out of 5. HEENT: PERRLA, EOM intact. Pulmonary: Decreased breath sounds  on the right,  mild scattered bilateral wheezing (baseline for her now) CardiovascularNormal S1,S2.  No m/r/g.   Abdomen: Benign, Soft, non-tender. Renal:  No costovertebral tenderness  GU:  Not performed at this time. Endoc: No evident thyromegaly, no signs of acromegaly. Skin:   warm, no rash. Extremities: normal, no cyanosis, clubbing.   LABORATORY PANEL:   CBC  Recent Labs Lab 06/29/15 0702  WBC 5.0  HGB 7.5*  HCT 23.8*  PLT 175   ------------------------------------------------------------------------------------------------------------------  Chemistries   Recent Labs Lab 06/29/15 [phone]  06/30/15 0608  NA 146* 149*  K 4.2 3.6  CL 109 110  CO2 28 31  GLUCOSE 125* 149*  BUN 76* 74*    CREATININE 0.80 0.78  CALCIUM 8.5* 8.8*  MG  --  1.3*  AST 60*  --   ALT 58*  --   ALKPHOS 140*  --   BILITOT 0.9  --    ------------------------------------------------------------------------------------------------------------------  Cardiac Enzymes No results for input(s): TROPONINI in the last 168 hours. ------------------------------------------------------------  RADIOLOGY:   No results found for this or any previous visit. No results found for this or any previous visit. ------------------------------------------------------------------------------------------------------------------  Thank  you for allowing [facility], Critical Care to assist in the care of your patient. Our recommendations are noted above.  Please contact us if we can be of further service.  Vilinda Boehringer, MD [place] [facility] Pager [phone] (please enter 7-digits) On Call Pager - [phone] (please enter 7-digits)   [place].  I have personally obtained a history, examined the patient, evaluated laboratory and imaging results, formulated the assessment and plan and placed orders. The case was discussed in detail with the critical care RN, critical care pharmacist, nutrition, case manager, unit manager, and respiratory therapist. The Patient requires high complexity decision making for assessment and support, frequent evaluation and titration of therapies, application of advanced monitoring technologies and extensive interpretation of multiple databases. The patient has critical illness that could lead imminently to failure of 1 or more organ systems and requires the highest level of physician preparedness to intervene.  Critical Care Time devoted to patient care services described in this note is 35 minutes and is exclusive of time spent in procedures.

## 2015-06-30 NOTE — Care Management Note (Signed)
Case Management Note  Patient Details  Name: Emily Pratt MRN: [id number] Date of Birth: Sep 14, 1945  Subjective/Objective:  Attempted to call spouse, phone goes straight to VM. Son's phone has no VM set up and doesn't answer. No answer at  sister's number               Action/Plan:   Expected Discharge Date:                  Expected Discharge Plan:     In-House Referral:     Discharge planning Services     Post Acute Care Choice:    Choice offered to:     DME Arranged:    DME Agency:     HH Arranged:    HH Agency:     Status of Service:  In process, will continue to follow  Medicare Important Message Given:    Date Medicare IM Given:    Medicare IM give by:    Date Additional Medicare IM Given:    Additional Medicare Important Message give by:     If discussed at [place] of Stay Meetings, dates discussed:    Additional Comments:  Jolly Mango, RN 06/30/2015, 1:54 PM

## 2015-06-30 NOTE — Progress Notes (Signed)
Physical Therapy Treatment Patient Details Name: Emily Pratt MRN: [id number] DOB: [birth year] Today's Date: 06/30/2015    History of Present Illness presented to ER with SOB, requiring BiPAP upon arrival; admitted with acute on chronic respiratory failure with hypercapnia, requiring intubation 10/25 (and subsequent failure to wean).  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  Has now completed once course of chemo (10/31) during hospitalization.  Status post trach placement; unable to wean from vent post-op.  Scheduled for additional round of chemo 11/29.    PT Comments    Pt was able to assist moving BLE's but quads are still especially weak.  Pt has no initiation with verbal cues and needs tactile to initiate.  Will anticipate LTAC being appropriate given pt's limited emergence of mobiltiy and strength.    Follow Up Recommendations  SNF;LTACH     Equipment Recommendations  None recommended by PT    Recommendations for Other Services       Precautions / Restrictions Precautions Precautions: Fall Precaution Comments: NPO, trach, vent dep Restrictions Weight Bearing Restrictions: No Other Position/Activity Restrictions: PEG tube with restrictions for posture on the bed/chair to have head elevated    Mobility  Bed Mobility Overal bed mobility: Needs Assistance Bed Mobility:  (shifting on bed to sit more upright)           General bed mobility comments: mod assist to attempt to sit up right, but continues to pull herself over to L side  Transfers                 General transfer comment: unable to attempt  Ambulation/Gait             General Gait Details: unable   Stairs            Wheelchair Mobility    Modified Rankin (Stroke Patients Only)       Balance Overall balance assessment: Needs assistance Sitting-balance support: Feet supported;Bilateral upper extremity supported Sitting  balance-Leahy Scale: Poor                              Cognition Arousal/Alertness: Awake/alert Behavior During Therapy: Restless;Impulsive Overall Cognitive Status: Impaired/Different from baseline Area of Impairment: Attention;Memory;Following commands;Safety/judgement;Awareness;Problem solving   Current Attention Level: Selective Memory: Decreased recall of precautions;Decreased short-term memory Following Commands: Follows one step commands inconsistently Safety/Judgement: Decreased awareness of safety;Decreased awareness of deficits Awareness: Intellectual Problem Solving: Slow processing;Difficulty sequencing;Decreased initiation;Requires verbal cues;Requires tactile cues General Comments: Pt cannot focus on any LE exercise without physically assisting her    Exercises General Exercises - Lower Extremity Ankle Circles/Pumps: AAROM;Both;10 reps Long Arc Quad: AAROM;Both;10 reps Heel Slides: AAROM;Both;10 reps Hip ABduction/ADduction: AAROM;Both;10 reps Hip Flexion/Marching: AAROM;Both;10 reps    General Comments General comments (skin integrity, edema, etc.): Pt is getting moved on bed which is in chair posture, has feet supported and is controlling  her wgt shift to sit to L hip.        Pertinent Vitals/Pain Pain Assessment: No/denies pain    Home Living                      Prior Function            PT Goals (current goals can now be found in the care plan section) Acute Rehab PT Goals Patient Stated Goal: none stated Progress towards PT goals: Progressing toward goals  Frequency  Min 2X/week    PT Plan Current plan remains appropriate    Co-evaluation             End of Session   Activity Tolerance: Patient tolerated treatment well;Patient limited by fatigue;Patient limited by lethargy Patient left: in bed;with call bell/phone within reach;with nursing/sitter in room     Time: 1429-1458 PT Time Calculation (min) (ACUTE  ONLY): 29 min  Charges:  $Therapeutic Exercise: 8-22 mins $Therapeutic Activity: 8-22 mins                    G Codes:      Ramond Dial 07/27/15, 4:28 PM   Mee Hives, PT MS Acute Rehab Dept. Number: [phone] [phone] and [place] [phone]

## 2015-06-30 NOTE — Consult Note (Signed)
Electrolyte CONSULT NOTE - FOLLOW UP  Pharmacy Consult for Electrolyte monitoring and replacement  Patient Measurements: Height: '5\' 7"'$  (170.2 cm) Weight: 207 lb (93.895 kg) IBW/kg (Calculated) : 61.6  Vital Signs: Temp: 98.8 F (37.1 C) (12/05 0400) Temp Source: Oral (12/05 0400) BP: 132/73 mmHg (12/05 0500) Pulse Rate: 86 (12/05 0500)  Recent Labs  06/28/15 0500 06/29/15 0702 06/30/15 0608  NA 149* 146* 149*  K 4.1 4.2 3.6  CL 113* 109 110  CO2 '28 28 31  '$ GLUCOSE 137* 125* 149*  BUN 88* 76* 74*  CREATININE 0.86 0.80 0.78  CALCIUM 8.6* 8.5* 8.8*  MG  --   --  1.3*  PHOS  --   --  4.4  PROT  --  6.8  --   ALBUMIN  --  2.9*  --   AST  --  60*  --   ALT  --  58*  --   ALKPHOS  --  140*  --   BILITOT  --  0.9  --    Estimated Creatinine Clearance: 80.3 mL/min (by C-G formula based on Cr of 0.78).   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 Pharmacy consulted for monitoring and managing electrolytes.   Plan:  Electrolytes are wnl. Will f/u AM labs.   12/5 Mg 1.3. 2 grams magnesium sulfate IV x1 ordered. Recheck electrolytes tomorrow AM.  Sim Boast, PharmD, BCPS  06/30/2015

## 2015-06-30 NOTE — Care Management Note (Signed)
Case Management Note  Patient Details  Name: Emily Pratt MRN: [id number] Date of Birth: April 08, 1946  Subjective/Objective:   Case discussed in progression rounds with team. Patient is not to receive any more chemotherapy. Will now pursue LTACH. Attempted to call spouse with VM left. Son's number didn't work and sister not at home number at this time. Will continue to make attempts to reach family regarding LTACH transfer                  Action/Plan:   Expected Discharge Date:                  Expected Discharge Plan:     In-House Referral:     Discharge planning Services     Post Acute Care Choice:    Choice offered to:     DME Arranged:    DME Agency:     HH Arranged:    [place] Agency:     Status of Service:  In process, will continue to follow  Medicare Important Message Given:    Date Medicare IM Given:    Medicare IM give by:    Date Additional Medicare IM Given:    Additional Medicare Important Message give by:     If discussed at [place] of Stay Meetings, dates discussed:    Additional Comments:  Jolly Mango, RN 06/30/2015, 11:39 AM

## 2015-06-30 NOTE — Progress Notes (Signed)
Nutrition Follow-up   INTERVENTION:   EN: recommend continuing current TF regimen; discussed current free water flushes and total free water being provided with MD Ashby Dawes during ICU rounds, sodium remains elevated; pt receiving 3.3 L of free water from TF and scheduled free water flushes; does not include additional water from meds, etc. MD does not want to increase free water or added additional IVF at this time, MD decreasing lasix frequency.  Coordination of Care: discussed plan for nutrition during ICU rounds, discussed possibility of PEG placement as pt has failed multiple vent weanings post trach, pt has been here since 10/24 with OG/NG tube in place since 10/25, pt remains Full Code at present; MD plans for one more weaning trial today. If pt fails, plan to place GI consult for PEG placement  NUTRITION DIAGNOSIS:   Inadequate oral intake related to acute illness as evidenced by NPO status.  GOAL:   Patient will meet greater than or equal to 90% of their needs  MONITOR:    (Energy Intake, Anthropometrics, Electrolyte/Renal Profile, Digestive System, Electrolyte/Renal Profile)  REASON FOR ASSESSMENT:   Consult Enteral/tube feeding initiation and management  ASSESSMENT:    Pt remains on vent via trach, per RN pt very agitated/upset this AM  EN: tolerating Vital High Protein at 60 ml/hr, Prostat, 350 mL free water q 6 hours  Skin:  Stage III pressure ulcer  Digestive System: no signs of TF intolerance, rectal tube in place, 600 mL loose stool in 24 hours per documentation  Urine Volume:  UOP 4.5 Liters in past 24 hours  Height:   Ht Readings from Last 1 Encounters:  05/20/2015 '5\' 7"'$  (1.702 m)    Weight:   Wt Readings from Last 1 Encounters:  06/30/15 207 lb (93.895 kg)    Filed Weights   06/28/15 0434 06/29/15 0500 06/30/15 0500  Weight: 232 lb 12.9 oz (105.6 kg) 212 lb 15.4 oz (96.6 kg) 207 lb (93.895 kg)    BMI:  Body mass index is 32.41  kg/(m^2).  Estimated Nutritional Needs:   Kcal:  1254-1596kcals, (11-14kcals/kg) using current weight of 114kg)  Protein:  122-153 g (2.0-2.5 g/kg IBW) or 140-176 g (1.2-1.5 g/kg current wt)  Fluid:  1535-1833m of fluid (25-397mkg)  EDUCATION NEEDS:   No education needs identified at this time  HIStewart ManorRD, LDN ([phone]

## 2015-06-30 NOTE — Progress Notes (Signed)
Gastric residual 10 cc

## 2015-06-30 NOTE — Plan of Care (Signed)
Problem: ICU Phase Progression Outcomes Goal: O2 sats trending toward baseline Outcome: Progressing Pt placed on Spontaneous mode 20/5. O2 sats maintaining > 96%. Pt is nonlabored regular breathing. States she does not feel SOB Goal: Voiding-avoid urinary catheter unless indicated Outcome: Not Met (add Reason) Pt still getting IV lasix. Pt still needs foley.

## 2015-07-01 LAB — CBC
HEMATOCRIT: 18.7 % — AB (ref 35.0–47.0)
HEMOGLOBIN: 6 g/dL — AB (ref 12.0–16.0)
MCH: 28.8 pg (ref 26.0–34.0)
MCHC: 31.8 g/dL — ABNORMAL LOW (ref 32.0–36.0)
MCV: 90.3 fL (ref 80.0–100.0)
PLATELETS: 118 10*3/uL — AB (ref 150–440)
RBC: 2.07 MIL/uL — AB (ref 3.80–5.20)
RDW: 18.2 % — ABNORMAL HIGH (ref 11.5–14.5)
WBC: 2.4 10*3/uL — ABNORMAL LOW (ref 3.6–11.0)

## 2015-07-01 LAB — GLUCOSE, CAPILLARY
Glucose-Capillary: 123 mg/dL — ABNORMAL HIGH (ref 65–99)
Glucose-Capillary: 147 mg/dL — ABNORMAL HIGH (ref 65–99)
Glucose-Capillary: 147 mg/dL — ABNORMAL HIGH (ref 65–99)
Glucose-Capillary: 140 mg/dL — ABNORMAL HIGH (ref 65–99)
Glucose-Capillary: 133 mg/dL — ABNORMAL HIGH (ref 65–99)
Glucose-Capillary: 79 mg/dL (ref 65–99)
Glucose-Capillary: 107 mg/dL — ABNORMAL HIGH (ref 65–99)

## 2015-07-01 LAB — BASIC METABOLIC PANEL
Anion gap: 7 (ref 5–15)
BUN: 76 mg/dL — AB (ref 6–20)
CHLORIDE: 108 mmol/L (ref 101–111)
CO2: 29 mmol/L (ref 22–32)
CREATININE: 0.83 mg/dL (ref 0.44–1.00)
Calcium: 8.5 mg/dL — ABNORMAL LOW (ref 8.9–10.3)
GFR calc Af Amer: >60 mL/min (ref >60)
GFR calc non Af Amer: >60 mL/min (ref >60)
GLUCOSE: 157 mg/dL — AB (ref 65–99)
POTASSIUM: 3.8 mmol/L (ref 3.5–5.1)
Sodium: 144 mmol/L (ref 135–145)

## 2015-07-01 LAB — PHOSPHORUS: Phosphorus: 4.9 mg/dL — ABNORMAL HIGH (ref 2.5–4.6)

## 2015-07-01 LAB — MAGNESIUM: MAGNESIUM: 1.8 mg/dL (ref 1.7–2.4)

## 2015-07-01 LAB — PREPARE RBC (CROSSMATCH)

## 2015-07-01 MED ORDER — FILGRASTIM 300 MCG/ML IJ SOLN
300.0000 ug | Freq: Every day | INTRAMUSCULAR | Status: DC
  Administered 2015-07-01 – 2015-07-09 (×9): 300 ug via SUBCUTANEOUS
  Filled 2015-07-01 (×13): qty 1

## 2015-07-01 MED ORDER — SODIUM CHLORIDE 0.9 % IV SOLN
Freq: Once | INTRAVENOUS | Status: AC
  Administered 2015-07-01: 16:00:00 via INTRAVENOUS

## 2015-07-01 NOTE — Progress Notes (Signed)
   07/01/15 1600  Clinical Encounter Type  Visited With Patient  Visit Type Follow-up  Consult/Referral To Nurse  Spiritual Encounters  Spiritual Needs Emotional  Stress Factors  Patient Stress Factors None identified;Other (Comment)  Chaplain rounded in the unit and heard the patient banging on the tray table. Went in to offer a compassionate presence and inquired regarding need. Chaplain Annely Sliva A. Jordynne Mccown Ext. [phone]

## 2015-07-01 NOTE — Progress Notes (Signed)
[place]  [phone] [phone]) [phone]  ID: Emily Pratt OB: January 05, 1946  MR#: [id number]  TIR#:[id number]  Patient Care Team: Dagoberto Ligas, MD as PCP - General (Internal Medicine)  CHIEF COMPLAINT:  Chief Complaint  Patient presents with  . Respiratory Distress    INTERVAL HISTORY: Patient now has tracheostomy, but remains on a ventilator. She is alert today and able to answer yes or no questions. She continues to be frustrated, but offers no further complaints. No family at bedside. Patient completed cycle 2 of her chemotherapy earlier this week. Hemoglobin and white blood cell count declining.   REVIEW OF SYSTEMS:   Review of Systems  Unable to perform ROS: intubated    PAST MEDICAL HISTORY: Past Medical History  Diagnosis Date  . Anxiety   . Arthritis   . COPD (chronic obstructive pulmonary disease) ([place])   . CHF (congestive heart failure) ([place])   . Hyperlipidemia   . Hypertension   . Diabetes mellitus without complication ([place])   . Chronic kidney disease   . Neuromuscular disorder ([place])     PAST SURGICAL HISTORY: Past Surgical History  Procedure Laterality Date  . Joint replacement Bilateral 2006    both knees  . Ankle surgery    . Tracheostomy tube placement N/A 06/08/2015    Procedure: TRACHEOSTOMY;  Surgeon: Margaretha Sheffield, MD;  Location: [facility] ORS;  Service: ENT;  Laterality: N/A;    FAMILY HISTORY Family History  Problem Relation Age of Onset  . Diabetes Mother   . Cancer Father   . Diabetes Sister   . Stroke Sister        ADVANCED DIRECTIVES:    HEALTH MAINTENANCE: Social History  Substance Use Topics  . Smoking status: Current Every Day Smoker    Types: Cigarettes  . Smokeless tobacco: None     Comment: 0.5 pack /day  . Alcohol Use: No     Colonoscopy:  PAP:  Bone density:  Lipid panel:  Allergies  Allergen Reactions  . Atorvastatin Other (See Comments)    Does not remember why she had  Intolerance to Lipitor.  . Rosuvastatin Anxiety    Chest tigtness and feeling as if she had heartburn.    Current Facility-Administered Medications  Medication Dose Route Frequency Provider Last Rate Last Dose  . 0.9 %  sodium chloride infusion   Intravenous Once Vishal Mungal, MD      . acetaminophen (TYLENOL) tablet 650 mg  650 mg Oral Q6H PRN Harrie Foreman, MD   650 mg at 06/24/15 6761   Or  . acetaminophen (TYLENOL) suppository 650 mg  650 mg Rectal Q6H PRN Harrie Foreman, MD      . antiseptic oral rinse solution (CORINZ)  7 mL Mouth Rinse QID Laverle Hobby, MD   7 mL at 07/01/15 1200  . bisacodyl (DULCOLAX) suppository 10 mg  10 mg Rectal Daily PRN Wilhelmina Mcardle, MD      . budesonide (PULMICORT) nebulizer solution 0.5 mg  0.5 mg Nebulization BID Flora Lipps, MD   0.5 mg at 07/01/15 0836  . chlorhexidine gluconate (PERIDEX) 0.12 % solution 15 mL  15 mL Mouth Rinse BID Flora Lipps, MD   15 mL at 07/01/15 0800  . clonazePAM (KLONOPIN) tablet 0.5 mg  0.5 mg Per Tube BID Vishal Mungal, MD   0.5 mg at 07/01/15 1109  . collagenase (SANTYL) ointment   Topical Daily Wilhelmina Mcardle, MD      . dexmedetomidine ([place]) 400  MCG/100ML (4 mcg/mL) infusion  0.4-1.2 mcg/kg/hr Intravenous Titrated Laverle Hobby, MD 11.7 mL/hr at 07/01/15 1118 0.5 mcg/kg/hr at 07/01/15 1118  . famotidine (PEPCID) tablet 20 mg  20 mg Per Tube BID Wilhelmina Mcardle, MD   20 mg at 07/01/15 1109  . feeding supplement (PRO-STAT SUGAR FREE 64) liquid 30 mL  30 mL Per Tube BID Laverle Hobby, MD   30 mL at 07/01/15 1107  . feeding supplement (VITAL HIGH PROTEIN) liquid 1,000 mL  1,000 mL Per Tube Continuous Laverle Hobby, MD   Stopped at 07/01/15 0900  . fentaNYL (SUBLIMAZE) injection 25-100 mcg  25-100 mcg Intravenous Q2H PRN Wilhelmina Mcardle, MD   100 mcg at 06/30/15 1028  . free water 350 mL  350 mL Per Tube 4 times per day Vilinda Boehringer, MD   350 mL at 07/01/15 0530  . furosemide (LASIX)  injection 20 mg  20 mg Intravenous Daily Laverle Hobby, MD   20 mg at 07/01/15 1109  . heparin lock flush 100 unit/mL  500 Units Intracatheter Once PRN Lloyd Huger, MD      . heparin lock flush 100 unit/mL  500 Units Intracatheter Once PRN Lloyd Huger, MD      . heparin lock flush 100 unit/mL  250 Units Intracatheter Once PRN Lloyd Huger, MD      . hydrALAZINE (APRESOLINE) injection 10-20 mg  10-20 mg Intravenous Q4H PRN Wilhelmina Mcardle, MD      . insulin aspart (novoLOG) injection 0-20 Units  0-20 Units Subcutaneous 6 times per day Wilhelmina Mcardle, MD   3 Units at 07/01/15 1227  . insulin glargine (LANTUS) injection 20 Units  20 Units Subcutaneous Daily Wilhelmina Mcardle, MD   20 Units at 07/01/15 1107  . levalbuterol (XOPENEX) nebulizer solution 0.63 mg  0.63 mg Nebulization Q3H PRN Wilhelmina Mcardle, MD      . levalbuterol [facility]) nebulizer solution 0.63 mg  0.63 mg Nebulization Q6H Wilhelmina Mcardle, MD   0.63 mg at 07/01/15 0836  . losartan (COZAAR) tablet 50 mg  50 mg Oral Daily Laverle Hobby, MD   50 mg at 07/01/15 1109  . metoCLOPramide (REGLAN) injection 5 mg  5 mg Intravenous Q4H PRN Praveen Mannam, MD   5 mg at 06/19/15 0143  . metoprolol (LOPRESSOR) injection 2.5-5 mg  2.5-5 mg Intravenous Q3H PRN Wilhelmina Mcardle, MD   5 mg at 06/27/15 0805  . metoprolol (LOPRESSOR) tablet 100 mg  100 mg Per Tube BID Wilhelmina Mcardle, MD   100 mg at 07/01/15 1109  . midazolam (VERSED) injection 1 mg  1 mg Intravenous Q2H PRN Vishal Mungal, MD   1 mg at 06/30/15 1024  . phenol (CHLORASEPTIC) mouth spray 1 spray  1 spray Mouth/Throat PRN Wilhelmina Mcardle, MD      . polyethylene glycol (MIRALAX / GLYCOLAX) packet 17 g  17 g Oral Daily PRN Laverle Hobby, MD      . sodium chloride 0.9 % injection 10 mL  10 mL Intracatheter PRN Lloyd Huger, MD      . sodium chloride 0.9 % injection 10 mL  10 mL Intracatheter PRN Lloyd Huger, MD      . sodium chloride 0.9 %  injection 10 mL  10 mL Intracatheter PRN Lloyd Huger, MD      . sodium chloride 0.9 % injection 3 mL  3 mL Intravenous PRN Lloyd Huger, MD      .  sodium chloride 0.9 % injection 3 mL  3 mL Intravenous PRN Lloyd Huger, MD      . sodium chloride 0.9 % injection 3 mL  3 mL Intravenous PRN Lloyd Huger, MD        OBJECTIVE: Filed Vitals:   07/01/15 1109 07/01/15 1200  BP: 114/68 107/66  Pulse: 70 70  Temp:    Resp:  19     Body mass index is 32.59 kg/(m^2).    ECOG FS:4 - Bedbound  General:  intubated Eyes: Pink conjunctiva, anicteric sclera. HEENT:  Trach tube in place. Lungs: Clear to auscultation bilaterally. Heart: Regular rate and rhythm. No rubs, murmurs, or gallops. Abdomen: Soft, nontender, nondistended. No organomegaly noted, normoactive bowel sounds. Musculoskeletal: No edema, cyanosis, or clubbing. Neuro: Alert. Skin: No rashes or petechiae noted.   LAB RESULTS:  Lab Results  Component Value Date   NA 144 07/01/2015   K 3.8 07/01/2015   CL 108 07/01/2015   CO2 29 07/01/2015   GLUCOSE 157* 07/01/2015   BUN 76* 07/01/2015   CREATININE 0.83 07/01/2015   CALCIUM 8.5* 07/01/2015   PROT 6.8 06/29/2015   ALBUMIN 2.9* 06/29/2015   AST 60* 06/29/2015   ALT 58* 06/29/2015   ALKPHOS 140* 06/29/2015   BILITOT 0.9 06/29/2015   GFRNONAA >60 07/01/2015   GFRAA >60 07/01/2015    Lab Results  Component Value Date   WBC 2.4* 07/01/2015   NEUTROABS 8.0* 06/21/2015   HGB 6.0* 07/01/2015   HCT 18.7* 07/01/2015   MCV 90.3 07/01/2015   PLT 118* 07/01/2015     STUDIES: Dg Chest 1 View  06/24/2015  CLINICAL DATA:  Acute respiratory failure, right pleural effusion, history of COPD, CHF, diabetes, chronic renal insufficiency, and current smoker. EXAM: CHEST 1 VIEW COMPARISON:  Portable chest x-ray of June 22, 2015 FINDINGS: The left lung is well-expanded. There is stable left lower lobe atelectasis with obscuration of the hemidiaphragm. On the  right diffusely increased interstitial and alveolar opacities persist. The hemidiaphragm remains obscured. Cardiac silhouette is enlarged but its right border is indistinct. The pulmonary vascularity is mildly prominent though stable. A tracheostomy appliance tube has its tip at the level of the inferior margin of the clavicular heads which lies 6.4 cm above the carina. The esophagogastric tube tip projects below the inferior margin of the image. The right-sided PICC line tip projects over the junction of the proximal and midportions of the SVC. IMPRESSION: Slight interval deterioration in the appearance of the left lower lobe. Persistent widespread interstitial and alveolar opacities on the right consistent with pneumonia or other alveolar filling process. There is no pneumothorax. Layering of pleural fluid posteriorly on the right is suspected. Electronically Signed   By: David  Martinique M.D.   On: 06/24/2015 07:42   Dg Chest 1 View  06/06/2015  CLINICAL DATA:  Tracheostomy tube placement EXAM: CHEST 1 VIEW COMPARISON:  06/15/2015 FINDINGS: Tracheostomy tube as been placed. The tip is 7.6 cm from the carina. Large right pleural effusion and associated pulmonary opacity are stable. Opacity at the left base is stable. Borderline cardiomegaly. No pneumothorax. Right upper extremity PICC is stable. IMPRESSION: Tracheostomy tube as been placed as described Stable large right pleural effusion and bilateral pulmonary opacity. Electronically Signed   By: Marybelle Killings M.D.   On: 06/19/2015 19:18   Dg Chest 1 View  06/15/2015  CLINICAL DATA:  Dyspnea. History of COPD, congestive heart failure, hypertension, chronic kidney disease. EXAM: CHEST 1 VIEW COMPARISON:  06/12/2015 FINDINGS: Endotracheal tube, enteric catheter, right PICC line terminating in the expected location of proximal superior vena cava are stable. Cardiomediastinal silhouette is borderline enlarged. Mediastinal contours appear intact. The ureter  demonstrates atherosclerotic calcifications of the arch. There is no evidence of pneumothorax. There is slight improvement of the aeration of the right lung with large right pleural effusion. Left lower lobe atelectasis is noted. Osseous structures are without acute abnormality. Soft tissues are grossly normal. IMPRESSION: Slight improvement of the aeration of the right lung, with persistent large right pleural effusion, an diffuse haziness of the visualized lung parenchyma. Atelectasis of the left lower lobe. Electronically Signed   By: Fidela Salisbury M.D.   On: 06/15/2015 09:12   Dg Abd 1 View  06/29/2015  CLINICAL DATA:  Patient has a naso gastic tube in place. HX copd, chf, htn, diabetes, ckd EXAM: ABDOMEN - 1 VIEW COMPARISON:  06/22/2015 and 06/18/2015. FINDINGS: 0601 hours. Two views obtained. Nasogastric tube projects over the left upper quadrant of the abdomen consistent with position in the mid stomach. The abdomen is incompletely visualized due to body habitus. The bowel gas pattern appears nonobstructive. There is no evidence of free intraperitoneal air or suspicious calcification. Degenerative changes are noted within the spine associated with a mild convex left scoliosis. IMPRESSION: Nasogastric tube appears unchanged at the level of the mid stomach. Electronically Signed   By: Richardean Sale M.D.   On: 06/29/2015 08:11   Dg Abd 1 View  06/01/2015  CLINICAL DATA:  69 year old female status post nasogastric tube placement EXAM: ABDOMEN - 1 VIEW COMPARISON:  Prior abdominal radiograph 06/09/2015 FINDINGS: A nasogastric tube projects over the left lower quadrant in the region of the mid stomach (based on prior imaging). The bowel gas pattern is otherwise nonspecific. Opacification of the right lung base consistent with known large right-sided pleural effusion. No acute osseous abnormality. IMPRESSION: Well-positioned nasogastric tube overlying the gastric body. Electronically Signed   By:  Jacqulynn Cadet M.D.   On: 06/02/2015 16:12   Dg Abd 1 View  06/06/2015  CLINICAL DATA:  69 year old female with abdominal distention and diarrhea. Initial encounter. EXAM: ABDOMEN - 1 VIEW COMPARISON:  06/02/2015 and earlier. FINDINGS: 2 Portable AP supine views at 1525 hours. Large body habitus. Improved bowel gas pattern since 05/30/2015, although increased gas-filled small and large bowel loops since 06/02/2015. Still, no dilated small bowel loops present. Large bowel caliber is at the upper limits of normal. Enteric tube remains in place. The side hole appears to be at the level of the diaphragm. Stable visualized osseous structures. IMPRESSION: 1. Increased gas filled large and small bowel loops since 06/02/2015 most suggestive of ileus. 2. Enteric tube in place, but side hole at the level of the distal thoracic esophagus. Advance 6 cm to place the side hole within the stomach. Electronically Signed   By: Genevie Ann M.D.   On: 06/06/2015 15:41   Dg Abd 1 View  06/02/2015  CLINICAL DATA:  Status post OG tube placement. EXAM: ABDOMEN - 1 VIEW COMPARISON:  None. FINDINGS: OG tube is in place with the tip in the stomach. The tube could be advanced 3-4 cm for better positioning. IMPRESSION: As above. Electronically Signed   By: Inge Rise M.D.   On: 06/02/2015 15:21   Dg Abd 1 View  06/02/2015  CLINICAL DATA:  Orogastric tube placement. EXAM: ABDOMEN - 1 VIEW COMPARISON:  Earlier today at 0800 hours. FINDINGS: Motion and patient body habitus degradation. Nasogastric tube  terminates at the body of the stomach. The side-port may be above the gastroesophageal junction. No gross free intraperitoneal air. Partial right hemi thorax opacification, incompletely imaged. IMPRESSION: Degraded exam, as detailed above. Nasogastric terminating at the body of the stomach. Electronically Signed   By: Abigail Miyamoto M.D.   On: 06/02/2015 09:28   Dg Abd 1 View  06/02/2015  CLINICAL DATA:  NG tube placement . EXAM:  ABDOMEN - 1 VIEW COMPARISON:  05/30/2015 . FINDINGS: NG tube tip noted at the level of the gastroesophageal junction. More distal placement should be considered. Persistent bowel distention. No free air . IMPRESSION: NG tube tip noted at the level of the gastroesophageal junction. More distal placement should be considered.These results will be called to the ordering clinician or representative by the Radiologist Assistant, and communication documented in the PACS or zVision Dashboard. Electronically Signed   By: Marcello Moores  Register   On: 06/02/2015 08:19   Ct Head Wo Contrast  06/20/2015  CLINICAL DATA:  Lung cancer.  Diabetes.  CHF.  Oxygen desaturation. EXAM: CT HEAD WITHOUT CONTRAST TECHNIQUE: Contiguous axial images were obtained from the base of the skull through the vertex without intravenous contrast. COMPARISON:  None FINDINGS: Sinuses/Soft tissues: Intubation. Bilateral mastoid effusions. Paranasal sinuses clear. Intracranial: Moderate motion degradation throughout. Moderate low density in the periventricular white matter likely related to small vessel disease. Cerebral atrophy which is age advanced. Carotid and vertebral artery atherosclerosis. Given motion limitation, no mass lesion, hemorrhage, hydrocephalus, acute infarct, intra-axial, or extra-axial fluid collection. IMPRESSION: 1. Moderate motion degraded exam. 2.  Cerebral atrophy and small vessel ischemic change. 3. No definite acute intracranial abnormality. Low sensitivity for intracranial metastasis secondary to noncontrast technique and motion. 4. Bilateral mastoid effusions. Electronically Signed   By: Abigail Miyamoto M.D.   On: 06/20/2015 15:33   Ct Chest W Contrast  06/06/2015  CLINICAL DATA:  Followup chest radiograph. History of CHF, COPD and hypertension. EXAM: CT CHEST WITH CONTRAST TECHNIQUE: Multidetector CT imaging of the chest was performed during intravenous contrast administration. CONTRAST:  18m OMNIPAQUE IOHEXOL 300 MG/ML   SOLN COMPARISON:  05/20/2015 FINDINGS: Mediastinum: The heart size is normal. No pericardial effusion. There is an endotracheal tube with tip above the carina. The nasogastric tube is present with tip in the body of the stomach. The index right supraclavicular lymph node measures 1.5 cm, image 5 of series 2. Previously 2.6 cm. Right paratracheal lymph node measure 2.0 cm, image 19 of series 2. Previously 3.3 cm. Pre-vascular lymph node measures 1.6 cm, image 25 of series 2. Previously 2.3 cm. Lungs/Pleura: There is a moderate size partially loculated right pleural effusion which has decreased in volume from previous exam. There has been interval improvement in aeration to the right upper lobe and superior segment of right lower lobe compatible with decrease in size of obstructing central right lung mass. Continued complete atelectasis and consolidation of the right middle lobe. The right lung mass is difficult to visualize and accurately remeasure. Mild atelectasis is noted within the posterior and medial left lower lobe. Upper Abdomen: Stable appearance of left adrenal nodule measuring 2.9 x 1.3 cm. The right adrenal gland is normal. Low to intermediate attenuation structure within the posterior right hepatic lobe measures 2.6 cm, image 57 of series 2. Unchanged from previous exam. There is mild perihepatic ascites noted. Musculoskeletal: Scoliosis deformity involves the thoracic spine. IMPRESSION: 1. Interval response to therapy. Since the previous exam there has been improved aeration to the right upper lobe  and superior segment of the right lower lobe. Findings are compatible with decrease in size of central obstructing right perihilar lesion 2. Decrease in size of mediastinal and right supraclavicular adenopathy. 3. No new or progressive disease identified. Electronically Signed   By: Kerby Moors M.D.   On: 06/06/2015 10:39   [place] Chest  06/02/2015  CLINICAL DATA:  Pleural effusion.  Lung cancer. EXAM:  CHEST ULTRASOUND COMPARISON:  Chest x-ray 06/02/2015. FINDINGS: Moderate right-sided pleural effusion noted. IMPRESSION: Moderate right pleural effusion . Electronically Signed   By: Marcello Moores  Register   On: 06/02/2015 10:05   Dg Chest Port 1 View  06/29/2015  CLINICAL DATA:  Ventilator patient. EXAM: PORTABLE CHEST 1 VIEW COMPARISON:  06/24/2015 and 06/22/2015. FINDINGS: 0557 hours. The tracheostomy, nasogastric tube and right arm PICC appear unchanged. The diffuse right lung airspace opacities appear mildly improved. There is stable left basilar opacity and small bilateral pleural effusions. No evidence of pneumothorax. The heart size and mediastinal contours are stable. IMPRESSION: Slight improvement in right lung airspace opacities. Stable support system. Electronically Signed   By: Richardean Sale M.D.   On: 06/29/2015 08:17   Dg Chest Port 1 View  06/22/2015  CLINICAL DATA:  69 year old female with history of respiratory failure. EXAM: PORTABLE CHEST 1 VIEW COMPARISON:  Chest x-ray 06/20/2015. FINDINGS: Tracheostomy tube in position with tip approximately 6.6 cm above the carina. A nasogastric tube is seen extending into the stomach, however, the tip of the nasogastric tube extends below the lower margin of the image. There is a right upper extremity PICC with tip terminating in the proximal superior vena cava. Lung volumes appear normal. Large right pleural effusion slightly decreased compared to the prior examination. Multifocal opacities throughout the right mid to lower lung and in the medial left lung base. No definite left pleural effusion. No evidence of pulmonary edema. Heart size appears borderline enlarged. The patient is rotated to the left on today's exam, resulting in distortion of the mediastinal contours and reduced diagnostic sensitivity and specificity for mediastinal pathology. Atherosclerosis in the thoracic aorta. IMPRESSION: 1. Support apparatus, as above. 2. Slight decreased size of  large right pleural effusion. Extensive areas of atelectasis and/or consolidation throughout the right mid to lower lung, and additionally in the medial left lung base. 3. Atherosclerosis. Electronically Signed   By: Vinnie Langton M.D.   On: 06/22/2015 14:10   Dg Chest Port 1 View  06/20/2015  CLINICAL DATA:  Ventilator dependent respiratory failure. Hypoxia. Followup right pleural effusion and postobstructive atelectasis/pneumonia in the right lower lobe and right middle lobe. EXAM: PORTABLE CHEST 1 VIEW COMPARISON:  06/19/2015 and earlier, including CT chest 06/06/2015. FINDINGS: Endotracheal tube tip in satisfactory position projecting approximately 6 cm above the carina. Right arm PICC tip projects at the junction of the right innominate vein and SVC, unchanged. Nasogastric tube courses below the diaphragm into the stomach. Since the 1114 hr examination yesterday, no change in the dense airspace consolidation in the right middle lobe and right lower lobe and the large right pleural effusion. Prominent bronchovascular markings in the left lung, unchanged, with improvement in the pulmonary venous hypertension. No new pulmonary parenchymal abnormalities. IMPRESSION: 1. Support apparatus satisfactory. 2. No change since yesterday in the dense atelectasis/pneumonia involving the right middle lobe and right lower lobe. 3. No change in the large right pleural effusion. 4. No new abnormalities. Electronically Signed   By: Evangeline Dakin M.D.   On: 06/20/2015 08:31   Dg Chest Port 1  View  06/19/2015  CLINICAL DATA:  Respiratory failure. Small cell carcinoma the lung. Right pleural effusion. EXAM: PORTABLE CHEST 1 VIEW 11:13 a.m. COMPARISON:  Radiographs from 06/08/2015 through 06/19/2015 at 5:42 a.m. and CT scan of the chest 06/06/2015 FINDINGS: The large right pleural effusion has decreased since the prior exam. PICC tip in the upper superior vena cava. Tracheostomy tube in good position. NG tube tip below  the diaphragm. Left lung is clear. Pulmonary vascularity is normal. Right mediastinal adenopathy is noted. IMPRESSION: Decreased large right pleural effusion.  No pneumothorax. Electronically Signed   By: Lorriane Shire M.D.   On: 06/19/2015 11:29   Dg Chest Port 1 View  06/19/2015  CLINICAL DATA:  Respiratory failure with increasing difficulty breathing. History of COPD, diabetes and lung cancer. EXAM: PORTABLE CHEST 1 VIEW COMPARISON:  Radiographs 06/18/2015 and 06/20/2015.  CT 06/06/2015. FINDINGS: 0954 hour. Right arm PICC has been slightly advanced to the level of the upper SVC. Tracheostomy and nasogastric tube appear unchanged. There is progressive volume loss and opacification in the right hemithorax consistent with progressive partial lung collapse. Underlying right pleural effusion appears unchanged. There is improved aeration of the left lung. The heart size appears unchanged. IMPRESSION: Progressive partial right lung collapse with increased pleural parenchymal opacities on the right hemithorax. Support system positioned as above. Electronically Signed   By: Richardean Sale M.D.   On: 06/19/2015 10:11   Dg Chest Port 1 View  06/18/2015  CLINICAL DATA:  Respiratory failure. EXAM: PORTABLE CHEST 1 VIEW COMPARISON:  05/27/2015. FINDINGS: Tracheostomy tube, NG tube, right PICC line stable position. PICC line tip projected over the right subclavian vein . Persistent cardiomegaly. Persistent bilateral pulmonary infiltrates particularly prominent on the right. Persistent bilateral pleural effusions. No interim improvement. No pneumothorax. IMPRESSION: 1. Lines and tubes in stable position. 2. Persistent cardiomegaly with unchanged bilateral pulmonary infiltrates, particular prominent on the right. Persistent bilateral pleural effusions. Findings are consistent with congestive heart failure with asymmetric pulmonary edema. Pneumonia, particularly on the right cannot be excluded. Electronically Signed    By: Marcello Moores  Register   On: 06/18/2015 07:53   Dg Chest Port 1 View  06/15/2015  CLINICAL DATA:  Respiratory failure. EXAM: PORTABLE CHEST 1 VIEW COMPARISON:  06/06/2015. FINDINGS: Tracheostomy tube, NG tube, right PICC line in stable position. Cardiomegaly with pulmonary vascular prominence and bilateral pulmonary alveolar infiltrates particularly the right. Findings suggest congestive heart failure with asymmetric pulmonary edema. Pneumonia cannot be excluded. No pneumothorax. IMPRESSION: 1. Lines and tubes in stable position. 2. Cardiomegaly with persistent bilateral pulmonary infiltrates, particularly prominent on the right. Persistent right pleural effusion. Findings suggest congestive heart failure with asymmetric pulmonary edema. Pneumonia cannot be excluded. No interim change from prior exam . Electronically Signed   By: [place]   On: 06/05/2015 07:28   Dg Chest Port 1 View  06/12/2015  CLINICAL DATA:  Short of breath. EXAM: PORTABLE CHEST 1 VIEW COMPARISON:  06/09/2015 FINDINGS: Endotracheal tube remains in good position. Right arm PICC tip in the proximal SVC unchanged. NG tube in place with the tip not visualized Complete opacification of the right chest which has progressed since the prior study. This is likely due to pleural effusion and collapse. Underlying pneumonia or tumor not excluded. Cardiac enlargement. Vascular congestion and on the left without pulmonary edema or effusion on the left. IMPRESSION: Endotracheal tube remains in good position Complete opacification of the right chest due to enlarging effusion and collapse of the right lung. Electronically Signed  By: Franchot Gallo M.D.   On: 06/12/2015 07:44   Dg Chest Port 1 View  06/09/2015  CLINICAL DATA:  Intubation and OG tube placement EXAM: PORTABLE CHEST 1 VIEW COMPARISON:  1 day prior FINDINGS: Endotracheal tube terminates 4.1 cm above carina.Nasogastric tube extends beyond the inferior aspect of the film. A  right-sided PICC line is poorly visualized centrally but likely terminates at the low SVC. The patient is minimally rotated left. Cardiomegaly accentuated by AP portable technique. Layering right pleural effusion. No pneumothorax. Interstitial edema is asymmetric and worse on the right. Lower lobe predominant right worse than left airspace disease is minimally improved. IMPRESSION: Minimal improvement in aeration since the exam of 1 day prior. Congestive heart failure with asymmetric interstitial and airspace opacities, worse on the right. Concurrent infection cannot be excluded. Layering right pleural effusion. Electronically Signed   By: Abigail Miyamoto M.D.   On: 06/09/2015 12:09   Dg Chest Port 1 View  06/08/2015  CLINICAL DATA:  Respiratory failure.  Followup exam. EXAM: PORTABLE CHEST 1 VIEW COMPARISON:  Chest CT and chest radiographs, 06/06/2015. FINDINGS: There is near complete opacification of the right hemi thorax consistent with combination of atelectasis/ consolidation and pleural fluid. There is, however, some more aeration of the mid to upper lung on the right than there was on the prior chest radiographs. Left lung is essentially clear.  No pneumothorax. Right-sided PICC is stable. Endotracheal tube and nasogastric tube have been removed. IMPRESSION: 1. Mild improvement from prior study with some more aeration evident of the right mid and upper lung. This is consistent with a decrease in lung consolidation/atelectasis. There is still significant opacification throughout most of the right hemi thorax. No new abnormalities. Electronically Signed   By: Lajean Manes M.D.   On: 06/08/2015 08:14   Dg Chest Port 1 View  06/06/2015  CLINICAL DATA:  Respiratory failure . EXAM: PORTABLE CHEST 1 VIEW COMPARISON:  06/02/2015.  CT 05/20/2015 . FINDINGS: Endotracheal tube 8 cm above the carina at the thoracic inlet. Slight interim advancement should be considered. NG tube and right PICC line in stable  position. Progressive consolidation/ atelectasis of the right lung and progressive right pleural effusion. Right pleural effusion is large. Left lung is clear. Stable cardiomegaly. IMPRESSION: 1. Endotracheal tube 8 cm above the carina at the thoracic inlet. Slightly mid aspect should be considered. NG tube and right PICC line stable position. 2. Progression of consolidation/atelectasis of the right lung with progression of the right pleural effusion. The right pleural effusion is large. 3. Stable cardiomegaly. 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 Unchanged position of endotracheal tube, terminating suitably above the carina, 4.5 cm. Unchanged gastric tube, terminating out of the field of view. Unchanged right upper extremity PICC, which terminates at the level the right mainstem bronchus. Overlying EKG leads and ventilator tubing. Atherosclerosis of the aortic arch. Dense opacity of the right lung persists, with obscuration the right hemidiaphragm, right heart borders, and the majority of the right lung. Minimal maintained aeration at the right apex. Airspace opacity at the left base persists, with similar aeration to the prior. No pneumothorax. IMPRESSION: Similar appearance of dense right-sided  airspace opacity, likely a combination of lung consolidation, atelectasis, tumor tissue, and pleural effusion. Unchanged position of support apparatus, as above. Airspace disease at the left base, similar to the comparison, likely combination of atelectasis, edema, consolidation. Small left pleural effusion not excluded. Atherosclerosis. Signed, Dulcy Fanny. Earleen Newport,  DO [facility] Electronically Signed   By: Corrie Mckusick D.O.   On: 06/02/2015 08:23   Dg Abd Portable 1v  06/22/2015  CLINICAL DATA:  NG tube residuals.  Check placement EXAM: PORTABLE ABDOMEN - 1 VIEW COMPARISON:  Radiograph 06/15/2015 FINDINGS: NG tube extends into the stomach. The side port is below the GE junction. Tip is below the margin of film. Second film was provided which demonstrates tip within the gastric body. Bilateral basilar effusions and atelectasis. IMPRESSION: NG tube appears in proper position. Electronically Signed   By: Suzy Bouchard M.D.   On: 06/22/2015 09:29   Dg Abd Portable 1v  06/09/2015  CLINICAL DATA:  OG tube placement EXAM: PORTABLE ABDOMEN - 1 VIEW COMPARISON:  06/06/2015 FINDINGS: The nasogastric tube tip is in the body of stomach. Side port is well below the GE junction. Mild gaseous distension of large and small bowel loops unchanged. IMPRESSION: 1. NG tube tip is in the body of stomach. Electronically Signed   By: Kerby Moors M.D.   On: 06/09/2015 12:04    ASSESSMENT:  Small cell lung cancer.   PLAN:    1. Small cell lung cancer: Patient is likely stage IV given her suspicious adrenal lesion. Recent CT of her head on June 20, 2015 did not report any metastatic disease.  CT scan results from June 06, 2015 revealed a positive response to cycle 1 of treatment with decreased size of her mass. Her persistent requirement for mechanical ventilation may more likely be secondary to poor lung function, pleural effusion, and COPD than external compression of her mass at this point. Patient completed cycle 2 of cisplatin and etoposide last week. Cycle 3 will be scheduled in 3-4 weeks. She will ultimately require port placement once she is off the ventilator. XRT would be helpful, but there are patient safety concerns transporting to radiation oncology.  Consult radiation oncology pending.  2.  Thrombocytopenia: Platelet count declining secondary to chemotherapy. Monitor. 3. Anemia:  Likely secondary to chemotherapy. Patient's hemoglobin has dropped to 6.0. She is scheduled to receive one unit of packed red blood cells today. 4. Leukopenia: Patient's white blood cell count is now declining secondary to chemotherapy, will institute daily Neupogen until [place] is greater than 1000. 5. Tracheostomy:  Appreciate ENT input. Tracheostomy in place.  6. Disposition: Long-term ventilation and tracheostomy facilities have been discussed, but this poses a problem for transportation back and forth for chemotherapy. Case previously discussed with case management as well as long-term facility representative.   Will follow.   Lloyd Huger, MD   07/01/2015 12:45 PM

## 2015-07-01 NOTE — Consult Note (Signed)
Except an outstanding is perfect of Radiation Oncology NEW PATIENT EVALUATION  Name: Emily Pratt  MRN: [id number]  Date:   05/05/2015     DOB: 16-Jul-1946   This 69 y.o. female patient presents to the clinic for initial evaluation of extensive stage small cell lung cancer with complete white out of the right lung on patient in ICU on ventilator.  REFERRING PHYSICIAN: No ref. provider found  CHIEF COMPLAINT:  Chief Complaint  Patient presents with  . Respiratory Distress    DIAGNOSIS: Diagnoses of COPD with acute exacerbation ([employer]), Pleural effusion, Acute respiratory failure with hypercapnia ([place]), Encounter for orogastric (OG) tube placement, Encounter for intubation, Acute respiratory failure with hypoxia ([place]), Acute renal failure (ARF) ([place]), Respiratory failure ([place]), Small cell lung cancer, left ([place]), S/P PICC central line placement, Dyspnea, Distended abdomen, Pleural effusion on right, Encounter for imaging study to confirm orogastric (OG) tube placement, Encounter for feeding tube placement, Drug-induced neutropenia ([place]), Lung cancer ([place]), Abdominal distention, Encounter for nasogastric (NG) tube placement, Tracheostomy tube present ([place]), Postop check, Respiratory failure with hypoxia ([place]), Encounter for imaging study to confirm nasogastric (NG) tube placement, Encounter for nasogastric tube placement, and Ventilator dependent ([place]) were pertinent to this visit.   PREVIOUS INVESTIGATIONS:  CT scans reviewed Pathology report reviewed Clinical notes reviewed  HPI: Patient is a 69 year old female with known history of COPD on home oxygen at 2 L congestive heart failure hypertension adult onset diabetes who presented to the emergency room with increasing shortness of breath. She was noted to be hypoxic chest x-ray initially showed what was presumed to be right sided pneumonia. CT scans demonstrate a large right lung mass compressing her bronchus. Patient was intubated and is  currently on a ventilator in the intensive care unit. She's had her second cycle of chemotherapy for small cell undifferentiated carcinoma diagnosed from her right upper lobe. CT scan demonstrated obstructing central mass involving the right lobe with occlusion of the right upper lobe and bronchus intermedius with complete atelectasis of the right lung. She also has mediastinal adenopathy mild compression of superior vena,. She also has probable involvement of her left adrenal gland although adenoma is not ruled out. She's responded somewhat on second cycle of chemotherapy although her condition condition continues to worsen I been asked to evaluate the patient for possible palliative radiation therapy. She is seen in the ICU. She's currently on ventilator.  PLANNED TREATMENT REGIMEN: Continued chemotherapy   PAST MEDICAL HISTORY:  has a past medical history of Anxiety; Arthritis; COPD (chronic obstructive pulmonary disease) ([place]); CHF (congestive heart failure) ([place]); Hyperlipidemia; Hypertension; Diabetes mellitus without complication ([place]); Chronic kidney disease; and Neuromuscular disorder ([place]).    PAST SURGICAL HISTORY:  Past Surgical History  Procedure Laterality Date  . Joint replacement Bilateral 2006    both knees  . Ankle surgery    . Tracheostomy tube placement N/A 06/13/2015    Procedure: TRACHEOSTOMY;  Surgeon: Margaretha Sheffield, MD;  Location: [facility] ORS;  Service: ENT;  Laterality: N/A;    FAMILY HISTORY: family history includes Cancer in her father; Diabetes in her mother and sister; Stroke in her sister.  SOCIAL HISTORY:  reports that she has been smoking Cigarettes.  She does not have any smokeless tobacco history on file. She reports that she does not drink alcohol or use illicit drugs.  ALLERGIES: Atorvastatin and Rosuvastatin  MEDICATIONS:  Current Facility-Administered Medications  Medication Dose Route Frequency Provider Last Rate Last Dose  . 0.9 %  sodium chloride infusion  Intravenous Once Vishal Mungal, MD      . acetaminophen (TYLENOL) tablet 650 mg  650 mg Oral Q6H PRN Harrie Foreman, MD   650 mg at 06/24/15 5784   Or  . acetaminophen (TYLENOL) suppository 650 mg  650 mg Rectal Q6H PRN Harrie Foreman, MD      . antiseptic oral rinse solution (CORINZ)  7 mL Mouth Rinse QID Laverle Hobby, MD   7 mL at 07/01/15 1200  . bisacodyl (DULCOLAX) suppository 10 mg  10 mg Rectal Daily PRN Wilhelmina Mcardle, MD      . budesonide (PULMICORT) nebulizer solution 0.5 mg  0.5 mg Nebulization BID Flora Lipps, MD   0.5 mg at 07/01/15 0836  . chlorhexidine gluconate (PERIDEX) 0.12 % solution 15 mL  15 mL Mouth Rinse BID Flora Lipps, MD   15 mL at 07/01/15 0800  . clonazePAM (KLONOPIN) tablet 0.5 mg  0.5 mg Per Tube BID Vishal Mungal, MD   0.5 mg at 07/01/15 1109  . collagenase (SANTYL) ointment   Topical Daily Wilhelmina Mcardle, MD      . dexmedetomidine (PRECEDEX) 400 MCG/100ML (4 mcg/mL) infusion  0.4-1.2 mcg/kg/hr Intravenous Titrated Laverle Hobby, MD 11.7 mL/hr at 07/01/15 1118 0.5 mcg/kg/hr at 07/01/15 1118  . famotidine (PEPCID) tablet 20 mg  20 mg Per Tube BID Wilhelmina Mcardle, MD   20 mg at 07/01/15 1109  . feeding supplement (PRO-STAT SUGAR FREE 64) liquid 30 mL  30 mL Per Tube BID Laverle Hobby, MD   30 mL at 07/01/15 1107  . feeding supplement (VITAL HIGH PROTEIN) liquid 1,000 mL  1,000 mL Per Tube Continuous Laverle Hobby, MD   Stopped at 07/01/15 0900  . fentaNYL (SUBLIMAZE) injection 25-100 mcg  25-100 mcg Intravenous Q2H PRN Wilhelmina Mcardle, MD   100 mcg at 06/30/15 1028  . filgrastim (NEUPOGEN) injection 300 mcg  300 mcg Subcutaneous Daily Lloyd Huger, MD   300 mcg at 07/01/15 1404  . free water 350 mL  350 mL Per Tube 4 times per day Vilinda Boehringer, MD   350 mL at 07/01/15 0530  . furosemide (LASIX) injection 20 mg  20 mg Intravenous Daily Laverle Hobby, MD   20 mg at 07/01/15 1109  . heparin lock flush 100 unit/mL  500  Units Intracatheter Once PRN Lloyd Huger, MD      . heparin lock flush 100 unit/mL  500 Units Intracatheter Once PRN Lloyd Huger, MD      . heparin lock flush 100 unit/mL  250 Units Intracatheter Once PRN Lloyd Huger, MD      . hydrALAZINE (APRESOLINE) injection 10-20 mg  10-20 mg Intravenous Q4H PRN Wilhelmina Mcardle, MD      . insulin aspart (novoLOG) injection 0-20 Units  0-20 Units Subcutaneous 6 times per day Wilhelmina Mcardle, MD   3 Units at 07/01/15 1227  . insulin glargine (LANTUS) injection 20 Units  20 Units Subcutaneous Daily Wilhelmina Mcardle, MD   20 Units at 07/01/15 1107  . levalbuterol (XOPENEX) nebulizer solution 0.63 mg  0.63 mg Nebulization Q3H PRN Wilhelmina Mcardle, MD      . levalbuterol [facility]) nebulizer solution 0.63 mg  0.63 mg Nebulization Q6H Wilhelmina Mcardle, MD   0.63 mg at 07/01/15 1413  . losartan (COZAAR) tablet 50 mg  50 mg Oral Daily Laverle Hobby, MD   50 mg at 07/01/15 1109  . metoCLOPramide (REGLAN) injection 5 mg  5  mg Intravenous Q4H PRN Marshell Garfinkel, MD   5 mg at 06/19/15 0143  . metoprolol (LOPRESSOR) injection 2.5-5 mg  2.5-5 mg Intravenous Q3H PRN Wilhelmina Mcardle, MD   5 mg at 06/27/15 0805  . metoprolol (LOPRESSOR) tablet 100 mg  100 mg Per Tube BID Wilhelmina Mcardle, MD   100 mg at 07/01/15 1109  . midazolam (VERSED) injection 1 mg  1 mg Intravenous Q2H PRN Vishal Mungal, MD   1 mg at 06/30/15 1024  . phenol (CHLORASEPTIC) mouth spray 1 spray  1 spray Mouth/Throat PRN Wilhelmina Mcardle, MD      . polyethylene glycol (MIRALAX / GLYCOLAX) packet 17 g  17 g Oral Daily PRN Laverle Hobby, MD      . sodium chloride 0.9 % injection 10 mL  10 mL Intracatheter PRN Lloyd Huger, MD      . sodium chloride 0.9 % injection 10 mL  10 mL Intracatheter PRN Lloyd Huger, MD      . sodium chloride 0.9 % injection 10 mL  10 mL Intracatheter PRN Lloyd Huger, MD      . sodium chloride 0.9 % injection 3 mL  3 mL Intravenous PRN  Lloyd Huger, MD      . sodium chloride 0.9 % injection 3 mL  3 mL Intravenous PRN Lloyd Huger, MD      . sodium chloride 0.9 % injection 3 mL  3 mL Intravenous PRN Lloyd Huger, MD        ECOG PERFORMANCE STATUS:  4 - Bedbound  REVIEW OF SYSTEMS: Review of systems not obtained secondary to her intubation.   PHYSICAL EXAM: BP 116/65 mmHg  Pulse 110  Temp(Src) 98.3 F (36.8 C) (Oral)  Resp 14  Ht '5\' 7"'$  (1.702 m)  Wt 208 lb 1.8 oz (94.4 kg)  BMI 32.59 kg/m2  SpO2 100% Well-developed ill-appearing female in her ICU on ventilator. No evidence of cervical or supra clavicular adenopathy is noted. Lungs show decreased breath sounds in the right side. Cardiac examination shows regular rate and rhythm abdomen is benign. She does have some 2+ edema in her lower extremities bilaterally. No evidence venous jugular distention facial plethora or collateral circulation is noted.  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  PLAN: At this time I see no way to Similac the patient or treat the patient based on her ventilator status and overall general medical condition. I believe options and her case will be continued chemotherapy and evaluate for response, hospice consult, [facility] can be administered safely with patient on ventilator support. I discussed the case personally with both medical oncology and pulmonary service. I will be happy to reevaluate this patient any time should her condition improved she can be weaned off the ventilator and may be able to treat with some palliative radiation therapy.  I would like to take this opportunity for allowing me to participate in the care of your patient.Armstead Peaks., MD

## 2015-07-01 NOTE — Progress Notes (Signed)
RT decreased PS to 15, VT's in 600's per Dr Stevenson Clinch request to wean to 10/5 today, then back on rate to rest tonight, possible TC trial tomorrow.

## 2015-07-01 NOTE — Progress Notes (Signed)
eLink Physician-Brief Progress Note Patient Name: Emily Pratt DOB: [birth year] MRN: [id number]   Date of Service  07/01/2015  HPI/Events of Note  Anemia, transfusion.  eICU Interventions  CBC ordered.     Intervention Category Major Interventions: Other:  YACOUB,WESAM 07/01/2015, 8:27 PM

## 2015-07-01 NOTE — Progress Notes (Signed)
* [name]  69 year old female with new diagnosis of small cell lung cancer, subsequently intubated due to right lung atelectasis and respiratory failure, because of the lung cancer. She is now status post 2 cycles of chemotherapy to see if this tumor can be shrunk.   Assessment and Plan:  Prolonged VDRF -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, chemotherapy; Significant improvement in airway patency by bronchoscopy 11/22. -Continued ventilator dependence, status post tracheostomy. -We'll attempt with ventilator weaning today.  Right lung, small cell lung cancer. -The patient did have some response with opening of the right mainstem airway. With the first course of chemotherapy. Her course has been, complicated by severe debility, deconditioning, pancytopenia, severe mucus plugging and overall continued general decline in her status. -After this course of chemotherapy. We will need to reevaluate the patient and see if she may be a candidate to go to long-term acute care facility, and potentially receive more chemotherapy or to change the patient to comfort measures only. -Hematology/oncology currently following, has now completed cycle 2 of cisplatin and etoposide -in addition given dx of small cell lung ca, will need evaluation for radiation treatment -Patient would benefit from radiation therapy, but there is concern for possible patient's safety transportation issues to radiation oncology. -PEG evaluation pending -consult to rad/onc for evaluation of radiation treament.   COPD  -Cont nebulized steroids -Cont nebulized bronchodilators.  PSVT  -Cont scheduled enteral metoprolol  Anemia/leukopenia/thrombocytopenia - pancytopenia - patient starting her nadir from chemo - monitor counts - transfuse 1 unit of prbc today - heme/onc to consider neulasta  Agitated, delirium. Severe debility and deconditioning.  Volume overload Large TF residuals -  resolved  Sinusitis -Completed 5 day course.   ?Paraneoplastic Syndrome - slowly improving -Has severe weakness ? [name].  [place] -Completing a 3 day course of diflucan.      Monitor BMET intermittently, check CE Monitor I/Os DVT px: SCDs Monitor CBC intermittently Transfuse per usual guidelines   Date: 07/01/2015  MRN# [id number] Emily Pratt October 27, 1945   Emily Pratt is a 69 y.o. old female seen in follow up for chief complaint of  Chief Complaint  Patient presents with  . Respiratory Distress     HPI/ROS: The patient is currently intubated and sedated and cannot provide history or review of systems. Awake this morning, states that she is in no pain, no further complaints  Allergies:  Atorvastatin and Rosuvastatin    Physical Examination:   VS: BP 116/63 mmHg  Pulse 63  Temp(Src) 98.3 F (36.8 C) (Oral)  Resp 18  Ht '5\' 7"'$  (1.702 m)  Wt 208 lb 1.8 oz (94.4 kg)  BMI 32.59 kg/m2  SpO2 100%  General Appearance: No distress  Neuro:without focal findings, bilateral extremity strength is 2 out of 5, bilateral lower extremity strength is 1 out of 5. HEENT: PERRLA, EOM intact. Pulmonary: Decreased breath sounds  on the right,  mild scattered bilateral wheezing (baseline for her now) CardiovascularNormal S1,S2.  No m/r/g.   Abdomen: Benign, Soft, non-tender. Renal:  No costovertebral tenderness  GU:  Not performed at this time. Endoc: No evident thyromegaly, no signs of acromegaly. Skin:   warm, no rash. Extremities: normal, no cyanosis, clubbing.   LABORATORY PANEL:   CBC  Recent Labs Lab 07/01/15 0423  WBC 2.4*  HGB 6.0*  HCT 18.7*  PLT 118*   ------------------------------------------------------------------------------------------------------------------  Chemistries   Recent Labs Lab 06/29/15 0702  07/01/15 0423  NA 146*  < >  144  K 4.2  < > 3.8  CL 109  < > 108  CO2 28  < > 29  GLUCOSE 125*  < > 157*  BUN 76*  < > 76*   CREATININE 0.80  < > 0.83  CALCIUM 8.5*  < > 8.5*  MG  --   < > 1.8  AST 60*  --   --   ALT 58*  --   --   ALKPHOS 140*  --   --   BILITOT 0.9  --   --   < > = values in this interval not displayed. ------------------------------------------------------------------------------------------------------------------  Cardiac Enzymes No results for input(s): TROPONINI in the last 168 hours. ------------------------------------------------------------  RADIOLOGY:   No results found for this or any previous visit. No results found for this or any previous visit. ------------------------------------------------------------------------------------------------------------------  Thank  you for allowing [facility] to assist in the care of your patient. Our recommendations are noted above.  Please contact us if we can be of further service.  Vilinda Boehringer, MD [facility] Pager [phone] (please enter 7-digits) On Call Pager - [phone] (please enter 7-digits)   [place].  I have personally obtained a history, examined the patient, evaluated laboratory and imaging results, formulated the assessment and plan and placed orders. The case was discussed in detail with the critical care RN, critical care pharmacist, nutrition, case manager, unit manager, and respiratory therapist. The Patient requires high complexity decision making for assessment and support, frequent evaluation and titration of therapies, application of advanced monitoring technologies and extensive interpretation of multiple databases. The patient has critical illness that could lead imminently to failure of 1 or more organ systems and requires the highest level of physician preparedness to intervene.  Critical Care Time devoted to patient care services described in this note is 35 minutes and is exclusive of time spent in procedures.

## 2015-07-01 NOTE — Progress Notes (Signed)
Dr. Marcille Blanco aware of WBC and Hgb.

## 2015-07-01 NOTE — Progress Notes (Signed)
RT decreased PS to 20 due to patient Vt 800-900's.  Tolerating well, will continue to monitor.

## 2015-07-01 NOTE — Consult Note (Signed)
GI Inpatient Consult Note  Reason for Consult: PEG tube placement   Attending Requesting Consult: [place]  History of Present Illness: Emily Pratt is a 69 y.o. female initially admitted on 04/29/2015 with significant SOB and respiratory failure.  Hypoxia and hypercapnia persisted despite BiPAP, with O2 sats in the 70s.  CXR showed worsening R pleural effusion; CT chest demonstrated a large R lung mass, compressing the bronchus, and mediastinal adenopathy.  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  Patient is s/p two cycles of chemotherapy to attempt shrinking the tumor.  Her history is also notable for COPD, PSVT, anemia, and thrombocytopenia.  Patient required intermittent intubation since admission, as attempts to extubate were repeatedly unsuccessful.  Tracheostomy was performed on 06/08/2015, and patient remains on vent via trach.  An OG or NG tube for feedings has also remained in place since 10/25, and patient has failed multiple vent weanings post-trach.  GI consult for PEG tube placement was ordered due to lack of improvement.  Emily Pratt is unable to answer questions verbally due to intubation, but responds to yes or no questions.  She denies GI complaints today including nausea, abdominal pain, and constipation.  She does appear frustrated by lack of ability to communicate.    Past Medical History:  Past Medical History  Diagnosis Date  . Anxiety   . Arthritis   . COPD (chronic obstructive pulmonary disease) ([place])   . CHF (congestive heart failure) ([place])   . Hyperlipidemia   . Hypertension   . Diabetes mellitus without complication ([place])   . Chronic kidney disease   . Neuromuscular disorder [facility]     Problem List: Patient Active Problem List   Diagnosis Date Noted  . Debility   . Collapse of right lung   . Tracheostomy tube present ([place])   . Lung cancer ([place])   . Pressure ulcer 06/09/2015  . Acute renal failure (ARF) ([place])   .  Pleural effusion on right   . Small cell lung cancer ([place]) 05/23/2015  . Acute respiratory failure with hypercapnia ([place])   . COPD with acute exacerbation ([place])   . Acute respiratory failure with hypoxia ([place])   . Lung mass   . Hypercapnic respiratory failure ([place]) 05/01/2015  . CAP (community acquired pneumonia) 04/24/2015  . CCF (congestive cardiac failure) ([place]) 04/10/2015  . Long term current use of opiate analgesic 04/10/2015  . Affective disorder, major ([place]) 04/10/2015  . Low back pain with sciatica 04/10/2015  . Menopause 04/10/2015  . Edema, peripheral 04/10/2015  . Mechanical and motor problems with internal organs 04/10/2015  . Avitaminosis D 04/10/2015  . COPD (chronic obstructive pulmonary disease) ([place]) 03/06/2015  . Hyperlipidemia 03/06/2015  . Chronic gout of foot 02/06/2015  . Current smoker 01/10/2015  . At risk for falling 01/10/2015  . Callus of foot 01/10/2015  . Anxiety 01/10/2015  . Arthralgia of hip 01/10/2015  . Leg pain 01/10/2015  . Chronic pain associated with significant psychosocial dysfunction 01/10/2015  . Chronic low back pain without sciatica 01/10/2015  . Continuous opioid dependence ([place]) 01/10/2015  . Chronic kidney disease (CKD), stage III (moderate) 01/10/2015  . CAFL (chronic airflow limitation) ([place]) 01/10/2015  . Diabetes mellitus type 2 in obese ([place]) 01/10/2015  . Diabetic peripheral neuropathy ([place]) 01/10/2015  . Abnormal serum level of alkaline phosphatase 01/10/2015  . Acid reflux 01/10/2015  . Cannot sleep 01/10/2015  . BP (high blood pressure) 01/10/2015  . PND (post-nasal drip) 01/10/2015  .  Lumbar canal stenosis 01/10/2015  . Chronic left hip pain 01/10/2015  . Bacterial pharyngitis 01/10/2015  . H/O viral illness 06/22/2011  . H/O atrial flutter 02/07/2007  . Hypoxia 10/28/2005    Past Surgical History: Past Surgical History  Procedure Laterality Date  . Joint replacement Bilateral 2006    both knees  . Ankle surgery     . Tracheostomy tube placement N/A 06/22/2015    Procedure: TRACHEOSTOMY;  Surgeon: Margaretha Sheffield, MD;  Location: [facility] ORS;  Service: ENT;  Laterality: N/A;    Allergies: Allergies  Allergen Reactions  . Atorvastatin Other (See Comments)    Does not remember why she had Intolerance to Lipitor.  . Rosuvastatin Anxiety    Chest tigtness and feeling as if she had heartburn.    Home Medications: Facility-administered medications prior to admission  Medication Dose Route Frequency Provider Last Rate Last Dose  . albuterol (PROVENTIL) (2.5 MG/3ML) 0.083% nebulizer solution 2.5 mg  2.5 mg Nebulization Once Ashok Norris, MD      . albuterol (PROVENTIL) (2.5 MG/3ML) 0.083% nebulizer solution 2.5 mg  2.5 mg Nebulization Once Roselee Nova, MD       Prescriptions prior to admission  Medication Sig Dispense Refill Last Dose  . albuterol (PROVENTIL) (2.5 MG/3ML) 0.083% nebulizer solution Take 3 mLs (2.5 mg total) by nebulization every 6 (six) hours as needed for wheezing or shortness of breath. 150 mL 1 prn at prn  . allopurinol (ZYLOPRIM) 300 MG tablet Take 1 tablet (300 mg total) by mouth daily. 90 tablet 0 unknown at unknown  . budesonide-formoterol (SYMBICORT) 160-4.5 MCG/ACT inhaler Inhale 2 puffs into the lungs 2 (two) times daily. 1 Inhaler 3 unknown at unknown  . colchicine 0.6 MG tablet Take 1 tablet (0.6 mg total) by mouth daily. 90 tablet 0 unknown at unknown  . furosemide (LASIX) 80 MG tablet Take 80 mg by mouth daily.    unknown at unknown  . JANUVIA 50 MG tablet Take 1 tablet (50 mg total) by mouth daily. 90 tablet 0 unknown at unknown  . losartan (COZAAR) 100 MG tablet Take 1 tablet (100 mg total) by mouth daily. 90 tablet 0 unknown at unknown  . LYRICA 50 MG capsule Take 1 capsule (50 mg total) by mouth 3 (three) times daily. 90 capsule 2 unknown at unknown  . metoprolol (LOPRESSOR) 100 MG tablet Take 1 tablet (100 mg total) by mouth 2 (two) times daily. 180 tablet 0 unknown at  unknown  . oxyCODONE-acetaminophen (PERCOCET) 10-325 MG tablet Take 1 tablet by mouth every 6 (six) hours as needed for pain. 120 tablet 0 prn at prn  . pravastatin (PRAVACHOL) 10 MG tablet Take 10 mg by mouth daily.   unknown at unknown  . tiotropium (SPIRIVA HANDIHALER) 18 MCG inhalation capsule Place 1 capsule (18 mcg total) into inhaler and inhale daily. 90 capsule 0 unknown at unknown  . VENTOLIN HFA 108 (90 BASE) MCG/ACT inhaler Inhale 1 puff into the lungs every 4 (four) hours as needed.    prn at prn  . predniSONE (DELTASONE) 10 MG tablet Take 1 tablet (10 mg total) by mouth daily with breakfast. (Patient not taking: Reported on 05/01/2015) 30 tablet 0 Completed Course at Unknown time   Home medication reconciliation was completed with the patient.   Scheduled Inpatient Medications:   . sodium chloride   Intravenous Once  . antiseptic oral rinse  7 mL Mouth Rinse QID  . budesonide (PULMICORT) nebulizer solution  0.5 mg Nebulization  BID  . chlorhexidine gluconate  15 mL Mouth Rinse BID  . clonazePAM  0.5 mg Per Tube BID  . collagenase   Topical Daily  . famotidine  20 mg Per Tube BID  . feeding supplement (PRO-STAT SUGAR FREE 64)  30 mL Per Tube BID  . filgrastim  300 mcg Subcutaneous Daily  . free water  350 mL Per Tube 4 times per day  . furosemide  20 mg Intravenous Daily  . insulin aspart  0-20 Units Subcutaneous 6 times per day  . insulin glargine  20 Units Subcutaneous Daily  . levalbuterol  0.63 mg Nebulization Q6H  . losartan  50 mg Oral Daily  . metoprolol tartrate  100 mg Per Tube BID    Continuous Inpatient Infusions:   . dexmedetomidine 0.5 mcg/kg/hr (07/01/15 1118)  . feeding supplement (VITAL HIGH PROTEIN) Stopped (07/01/15 0900)    PRN Inpatient Medications:  acetaminophen **OR** acetaminophen, bisacodyl, fentaNYL (SUBLIMAZE) injection, heparin lock flush, heparin lock flush, heparin lock flush, hydrALAZINE, levalbuterol, metoCLOPramide (REGLAN) injection,  metoprolol, midazolam, phenol, polyethylene glycol, sodium chloride, sodium chloride, sodium chloride, sodium chloride, sodium chloride, sodium chloride  Family History: family history includes Cancer in her father; Diabetes in her mother and sister; Stroke in her sister.   Social History:   reports that she has been smoking Cigarettes.  She does not have any smokeless tobacco history on file. She reports that she does not drink alcohol or use illicit drugs.   Review of Systems: Unable to obtain due to vent.   Physical Examination: BP 116/65 mmHg  Pulse 110  Temp(Src) 97.6 F (36.4 C) (Oral)  Resp 14  Ht '5\' 7"'$  (1.702 m)  Wt 94.4 kg (208 lb 1.8 oz)  BMI 32.59 kg/m2  SpO2 100% Gen: NAD, alert and oriented HEENT: PEERLA, EOMI, trach in place Neck: supple, no JVD or thyromegaly Chest: CTA bilaterally, decreased breath sounds on R, mild expiratory wheeze CV: RRR, no m/g/c/r Abd: soft, NT, ND, +BS in all four quadrants; no HSM, guarding, ridigity, or rebound tenderness Ext: no edema, well perfused with 2+ pulses, Skin: no rash or lesions noted Lymph: no LAD  Data: Lab Results  Component Value Date   WBC 2.4* 07/01/2015   HGB 6.0* 07/01/2015   HCT 18.7* 07/01/2015   MCV 90.3 07/01/2015   PLT 118* 07/01/2015    Recent Labs Lab 06/28/15 0500 06/29/15 0702 07/01/15 0423  HGB 7.2* 7.5* 6.0*   Lab Results  Component Value Date   NA 144 07/01/2015   K 3.8 07/01/2015   CL 108 07/01/2015   CO2 29 07/01/2015   BUN 76* 07/01/2015   CREATININE 0.83 07/01/2015   GLU 126 11/14/2014   Lab Results  Component Value Date   ALT 58* 06/29/2015   AST 60* 06/29/2015   ALKPHOS 140* 06/29/2015   BILITOT 0.9 06/29/2015   No results for input(s): APTT, INR, PTT in the last 168 hours. Assessment/Plan: Emily Pratt is a 69 y.o. female newly diagnosed with stage IV lung cancer s/p radiation, s/p trach placement, current on vent.  Patient has been receiving tube feeds via OG/NG tubes  since 10/25, and multiple attempts to wean off vent since trach placement have been unsuccessful.  Recommend PEG tube placement to improve nutrition status.  Will check INR with morning labs, await CBC as Hgb was low today at 6.0.  Would like to see Hgb back at baseline of 7 prior to anesthesia.  Will continue to monitor, further recs prior  to PEG tube placement per Dr. Rayann Heman.  Recommendations: - PT/INR ordered with am labs - Continue to monitor Hgb, would like to see Hgb 7+ prior to anesthesia - Plan PEG tube placement pending above per Dr. Rayann Heman  Thank you for the consult. We will follow along with you. Please call with questions or concerns.  Lavera Guise, PA-C [facility] Phone: [phone] Pager: [phone]

## 2015-07-01 NOTE — Progress Notes (Signed)
S/W Santiago Glad, wound care rn. She stated she was not going to make it by to assess pt today, instructed me to apply a dry dressing of 4x4 gauze and abd pad. She will be here tomorrow to assess.

## 2015-07-01 NOTE — Care Management (Signed)
Spoke with Dr Grayland Ormond.  Patient has just completed her second round of chemo.  Chemo plan and schedule: patient to receive 4 - 6 treatments.  Each treatment lasts 3 days and are planned for every three weeks.  There was 4 weeks between her first and second round due to labs.  Has been assessed by radiation oncology for palliative radiation.  At present, patient has not been scheduled for any treatment due to being on the ventilator.  Updated [facility] on plan for chemo.  Plan is very guarded for transfer to LTAC due to chemo.  patient remains on the ventilator and unable to pursue PEG due to low platelets.

## 2015-07-01 NOTE — Consult Note (Signed)
Electrolyte CONSULT NOTE - FOLLOW UP  Pharmacy Consult for Electrolyte monitoring and replacement  Patient Measurements: Height: '5\' 7"'$  (170.2 cm) Weight: 208 lb 1.8 oz (94.4 kg) IBW/kg (Calculated) : 61.6  Vital Signs: Temp: 97.6 F (36.4 C) (12/06 0800) Temp Source: Oral (12/06 0800) BP: 116/65 mmHg (12/06 1500) Pulse Rate: 110 (12/06 1500)  Recent Labs  06/29/15 0702 06/30/15 0608 07/01/15 0423  NA 146* 149* 144  K 4.2 3.6 3.8  CL 109 110 108  CO2 '28 31 29  '$ GLUCOSE 125* 149* 157*  BUN 76* 74* 76*  CREATININE 0.80 0.78 0.83  CALCIUM 8.5* 8.8* 8.5*  MG  --  1.3* 1.8  PHOS  --  4.4 4.9*  PROT 6.8  --   --   ALBUMIN 2.9*  --   --   AST 60*  --   --   ALT 58*  --   --   ALKPHOS 140*  --   --   BILITOT 0.9  --   --    Estimated Creatinine Clearance: 77.6 mL/min (by C-G formula based on Cr of 0.83).   Recent Labs  07/01/15 0427 07/01/15 0730 07/01/15 1153  GLUCAP 147* 140* 133*    Assessment: 69 yo female with SCLC current;y intubated in ICU. Pharmacy consulted for monitoring and managing electrolytes.   Plan:  Electrolytes are wnl. Will f/u AM labs.   Ulice Dash, PharmD  07/01/2015

## 2015-07-01 NOTE — Progress Notes (Signed)
Nutrition Follow-up     INTERVENTION:   EN: recommend continuing current TF regimen; continue to assess   NUTRITION DIAGNOSIS:   Inadequate oral intake related to acute illness as evidenced by NPO status.  GOAL:   Patient will meet greater than or equal to 90% of their needs  MONITOR:    (Energy Intake, Anthropometrics, Electrolyte/Renal Profile, Digestive System, Electrolyte/Renal Profile)  REASON FOR ASSESSMENT:   Consult Enteral/tube feeding initiation and management  ASSESSMENT:    Pt remains on vent via trach, currently on precedex, radiation oncology pending for evaluation of radiation treatment pending; GI consult for PEG placement ordered this AM  EN: tolerating Vital High Protein at rate of 60 ml/hr  Skin:  Stage III pressure ulcer  Digestive System: no signs of TF intolerance, minimal residuals per Roselyn Reef RN, flexiseal  Electrolyte and Renal Profile:  Recent Labs Lab 06/29/15 0702 06/30/15 0608 07/01/15 0423  BUN 76* 74* 76*  CREATININE 0.80 0.78 0.83  NA 146* 149* 144  K 4.2 3.6 3.8  MG  --  1.3* 1.8  PHOS  --  4.4 4.9*   Glucose Profile:  Recent Labs  07/01/15 0427 07/01/15 0730 07/01/15 1153  GLUCAP 147* 140* 133*   Meds: lasix daily, ss novolog, neupogen started today  Height:   Ht Readings from Last 1 Encounters:  05/08/2015 '5\' 7"'$  (1.702 m)    Weight:   Wt Readings from Last 1 Encounters:  07/01/15 208 lb 1.8 oz (94.4 kg)   BMI:  Body mass index is 32.59 kg/(m^2).  Estimated Nutritional Needs:   Kcal:  1254-1596kcals, (11-14kcals/kg) using current weight of 114kg)  Protein:  122-153 g (2.0-2.5 g/kg IBW) or 140-176 g (1.2-1.5 g/kg current wt)  Fluid:  1535-189m of fluid (25-362mkg)  EDUCATION NEEDS:   No education needs identified at this time  HIHumeRD, LDN ([phone]

## 2015-07-01 NOTE — Progress Notes (Signed)
PT Cancellation Note  Patient Details Name: Emily Pratt MRN: [id number] DOB: May 04, 1946   Cancelled Treatment:    Reason Eval/Treat Not Completed: Patient declined, no reason specified. Treatment attempted; pt lethargic, but does open eyes off and on. Pt shakes head "no" when asked to participate in lower extremity exercises in the bed. Will re attempt treatment tomorrow as appropriate.    Erline Levine Bishop 07/01/2015, 3:30 PM

## 2015-07-01 NOTE — Progress Notes (Signed)
RT placed patient in SBT 25/5 30%.  Patient tolerating well at this time.

## 2015-07-02 LAB — BASIC METABOLIC PANEL
ANION GAP: 6 (ref 5–15)
Anion gap: 7 (ref 5–15)
BUN: 76 mg/dL — ABNORMAL HIGH (ref 6–20)
BUN: 72 mg/dL — AB (ref 6–20)
CALCIUM: 8.8 mg/dL — AB (ref 8.9–10.3)
CHLORIDE: 116 mmol/L — AB (ref 101–111)
CO2: 29 mmol/L (ref 22–32)
CO2: 29 mmol/L (ref 22–32)
CREATININE: 0.81 mg/dL (ref 0.44–1.00)
Calcium: 9.2 mg/dL (ref 8.9–10.3)
Chloride: 117 mmol/L — ABNORMAL HIGH (ref 101–111)
Creatinine, Ser: 0.9 mg/dL (ref 0.44–1.00)
GFR calc Af Amer: >60 mL/min (ref >60)
GFR calc Af Amer: >60 mL/min (ref >60)
GFR calc non Af Amer: >60 mL/min (ref >60)
GFR calc non Af Amer: >60 mL/min (ref >60)
GLUCOSE: 129 mg/dL — AB (ref 65–99)
GLUCOSE: 114 mg/dL — AB (ref 65–99)
POTASSIUM: 4 mmol/L (ref 3.5–5.1)
Potassium: 4.1 mmol/L (ref 3.5–5.1)
SODIUM: 152 mmol/L — AB (ref 135–145)
Sodium: 152 mmol/L — ABNORMAL HIGH (ref 135–145)

## 2015-07-02 LAB — CBC
HEMATOCRIT: 25.9 % — AB (ref 35.0–47.0)
Hemoglobin: 8.2 g/dL — ABNORMAL LOW (ref 12.0–16.0)
MCH: 29 pg (ref 26.0–34.0)
MCHC: 31.9 g/dL — ABNORMAL LOW (ref 32.0–36.0)
MCV: 91.1 fL (ref 80.0–100.0)
PLATELETS: 116 10*3/uL — AB (ref 150–440)
RBC: 2.84 MIL/uL — ABNORMAL LOW (ref 3.80–5.20)
RDW: 17.6 % — AB (ref 11.5–14.5)
WBC: 11.9 10*3/uL — AB (ref 3.6–11.0)

## 2015-07-02 LAB — PROTIME-INR
INR: 1.23
Prothrombin Time: 15.7 seconds — ABNORMAL HIGH (ref 11.4–15.0)

## 2015-07-02 LAB — GLUCOSE, CAPILLARY
Glucose-Capillary: 106 mg/dL — ABNORMAL HIGH (ref 65–99)
Glucose-Capillary: 113 mg/dL — ABNORMAL HIGH (ref 65–99)
Glucose-Capillary: 120 mg/dL — ABNORMAL HIGH (ref 65–99)
Glucose-Capillary: 98 mg/dL (ref 65–99)
Glucose-Capillary: 92 mg/dL (ref 65–99)
Glucose-Capillary: 79 mg/dL (ref 65–99)

## 2015-07-02 LAB — ACID FAST CULTURE WITH REFLEXED SENSITIVITIES (MYCOBACTERIA)
Acid Fast Culture: NEGATIVE
Acid Fast Smear: NEGATIVE
Source of Sample: 183641

## 2015-07-02 MED ORDER — COLLAGENASE 250 UNIT/GM EX OINT
TOPICAL_OINTMENT | Freq: Every day | CUTANEOUS | Status: AC
  Administered 2015-07-02: 14:00:00 via TOPICAL
  Administered 2015-07-03 – 2015-07-06 (×4): 1 via TOPICAL
  Administered 2015-07-07: 15:00:00 via TOPICAL
  Administered 2015-07-08 – 2015-07-09 (×2): 1 via TOPICAL
  Administered 2015-07-10 – 2015-07-13 (×4): via TOPICAL
  Administered 2015-07-14: 1 via TOPICAL
  Administered 2015-07-15: 10:00:00 via TOPICAL
  Filled 2015-07-02 (×2): qty 30

## 2015-07-02 MED ORDER — ALPRAZOLAM 0.5 MG PO TABS
0.5000 mg | ORAL_TABLET | Freq: Three times a day (TID) | ORAL | Status: DC
  Administered 2015-07-02 – 2015-07-06 (×13): 0.5 mg via ORAL
  Filled 2015-07-02 (×13): qty 1

## 2015-07-02 NOTE — Evaluation (Signed)
[facility]y-[facility] Speaking Valve - Evaluation Patient Details  Name: Emily Pratt MRN: [id number] Date of Birth: 17-Apr-1946  Today's Date: 07/02/2015 Time: 1350-1450 SLP Time Calculation (min) (ACUTE ONLY): 60 min  Past Medical History:  Past Medical History  Diagnosis Date  . Anxiety   . Arthritis   . COPD (chronic obstructive pulmonary disease) ([place])   . CHF (congestive heart failure) ([place])   . Hyperlipidemia   . Hypertension   . Diabetes mellitus without complication ([place])   . Chronic kidney disease   . Neuromuscular disorder [facility])    Past Surgical History:  Past Surgical History  Procedure Laterality Date  . Joint replacement Bilateral 2006    both knees  . Ankle surgery    . Tracheostomy tube placement N/A 06/15/2015    Procedure: TRACHEOSTOMY;  Surgeon: Margaretha Sheffield, MD;  Location: [facility] ORS;  Service: ENT;  Laterality: N/A;   HPI:      Assessment / Plan / Recommendation Clinical Impression  Pt does not appear appropriate for PMV use at this time sec. to her declined Cognitive status and agitation. Pt does continue to exhibit moderate tracheal secretions impacting her safe toleration of PMV. Pt is abel to use finger occlusion to verbalize few words to make wants/needs known although noted decreased volume and intelligibility of speech d/t decreased breath support. Suspect this could be impacted by her agitiation. Rec. continued ST f/u for trials of PMV use/wear; education on use/wear. NSG updated and agreed. Rec. no po intake at this time until pt can better tolerate and wear the PMV to aid safety w/ po's/swallowing.      SLP Assessment  Patient needs continued Speech Lanaguage Pathology Services    Follow Up Recommendations  Skilled Nursing facility (TBD)    Frequency and Duration min 3x week  1 week    PMSV Trial PMSV was placed for: 10 mins.  Able to redirect subglottic air through upper airway: Yes Able to Attain Phonation: Yes Voice Quality: Breathy;Hoarse;Low  vocal intensity Able to Expectorate Secretions: No attempts Level of Secretion Expectoration with PMSV: Not observed Breath Support for Phonation: Inadequate Intelligibility: Intelligibility reduced Word: 0-24% accurate Phrase: 0-24% accurate Sentence: 0-24% accurate Respirations During Trial: 26 SpO2 During Trial: 93 % Pulse During Trial: 87 Behavior: Angry;Confused;Poor eye contact   Tracheostomy Tube  Additional Tracheostomy Tube Assessment Trach Collar Period: has been on trach collar for ~4 hrs Secretion Description: moderate Frequency of Tracheal Suctioning: NSG suctioning often; suctioning for session Level of Secretion Expectoration: Tracheal    Vent Dependency  Vent Dependent: Yes Vent Mode: PSV;CPAP PEEP: 5 cmH20 Pressure Support: 10 cmH20 FiO2 (%): 35 % Weaning Trials: Yes    Cuff Deflation Trial Tolerated Cuff Deflation: Yes Length of Time for Cuff Deflation Trial: ~2 hrs Behavior: Angry;Confused;Poor eye contact Cuff Deflation Trial - Comments: pt is able to use finger occlusion w/ cues to verbalize     Watson,Katherine 07/02/2015, 3:40 PM Orinda Kenner, MS, CCC-SLP

## 2015-07-02 NOTE — Consult Note (Signed)
Electrolyte CONSULT NOTE - FOLLOW UP  Pharmacy Consult for Electrolyte monitoring and replacement  Patient Measurements: Height: '5\' 7"'$  (170.2 cm) Weight: 200 lb 6.4 oz (90.9 kg) IBW/kg (Calculated) : 61.6  Vital Signs: Temp: 97.6 F (36.4 C) (12/07 0800) Temp Source: Oral (12/07 0800) BP: 150/86 mmHg (12/07 1400) Pulse Rate: 92 (12/07 1400)  Recent Labs  06/30/15 0608 07/01/15 0423 07/02/15 0636 07/02/15 1139  NA 149* 144 152* 152*  K 3.6 3.8 4.1 4.0  CL 110 108 117* 116*  CO2 '31 29 29 29  '$ GLUCOSE 149* 157* 129* 114*  BUN 74* 76* 76* 72*  CREATININE 0.78 0.83 0.90 0.81  CALCIUM 8.8* 8.5* 8.8* 9.2  MG 1.3* 1.8  --   --   PHOS 4.4 4.9*  --   --    Estimated Creatinine Clearance: 78 mL/min (by C-G formula based on Cr of 0.81).   Recent Labs  07/02/15 0348 07/02/15 0739 07/02/15 1111  GLUCAP 113* 120* 98    Assessment: 69 yo female with SCLC current;y intubated in ICU. Pharmacy consulted for monitoring and managing electrolytes.   Plan:  Electrolytes are wnl. Will f/u AM labs.   Ulice Dash, PharmD  07/02/2015

## 2015-07-02 NOTE — Progress Notes (Signed)
Physical Therapy Treatment Patient Details Name: Emily Pratt MRN: [id number] DOB: Jul 11, 1946 Today's Date: 07/02/2015    History of Present Illness presented to ER with SOB, requiring BiPAP upon arrival; admitted with acute on chronic respiratory failure with hypercapnia, requiring intubation 10/25 (and subsequent failure to wean).  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  Has now completed once course of chemo (10/31) during hospitalization.  Status post trach placement; unable to wean from vent post-op.  Scheduled for additional round of chemo 11/29.  Weaned to trach collar today and precedex off; tolerating well with stable vitals.    PT Comments    Noted improvement in alertness, participation and active effort this date.  Demonstrating at least 3-/5 strength in all extremities, tolerating some degrees of manual resistance at times. Currently weaned to trach collar (and precedex off) and tolerating well; vitals stable and WFL. If patient remains stable on trach collar, will attempt progression towards unsupported sitting and OOB as medically appropriate.   Follow Up Recommendations  SNF;LTACH     Equipment Recommendations       Recommendations for Other Services       Precautions / Restrictions Precautions Precautions: Fall Precaution Comments: NPO, trach collar Restrictions Weight Bearing Restrictions: No    Mobility  Bed Mobility               General bed mobility comments: deferred this date; tolerating elevated HOB at 45 degrees throughout entirety of session  Transfers                    Ambulation/Gait                 Stairs            Wheelchair Mobility    Modified Rankin (Stroke Patients Only)       Balance                                    Cognition Arousal/Alertness: Awake/alert Behavior During Therapy: WFL for tasks assessed/performed                   General Comments: patient preferring to mouth words vs. attempt writing; able to communicate basic needs and responds/interacts appropriately with therapist    Exercises Other Exercises Other Exercises: Supine LE therex, 1x15, AROM: ankle pumps, quad sets, SAQs, heel slides, hip abduct/adduct (with manual resistance as appropriate).  Demonstrating marked improvement in active effort and participation this date, strength at least 3-/5 throughout [place].    General Comments        Pertinent Vitals/Pain Pain Assessment: Faces Pain Score: 0-No pain Pain Location: no cinical indicators of pain noted    Home Living                      Prior Function            PT Goals (current goals can now be found in the care plan section) Acute Rehab PT Goals Patient Stated Goal: "I want coffee" PT Goal Formulation: With patient Time For Goal Achievement: 07/05/15 Potential to Achieve Goals: Fair Progress towards PT goals: Progressing toward goals    Frequency  Min 2X/week    PT Plan Current plan remains appropriate    Co-evaluation             End of  Session   Activity Tolerance: Patient tolerated treatment well Patient left: in bed;with call bell/phone within reach;with nursing/sitter in room     Time: 6967-8938 PT Time Calculation (min) (ACUTE ONLY): 25 min  Charges:  $Therapeutic Exercise: 23-37 mins                    G Codes:       Calie Buttrey H. Owens Shark, PT, DPT, NCS 07/02/2015, 10:11 AM [phone]

## 2015-07-02 NOTE — Progress Notes (Signed)
Nutrition Follow-up    INTERVENTION:   EN: recommend resuming TF as per order set if pt does not get PEG tube today Coordination of Care: discussed hypernatremia during ICU rounds, pt may benefit from IV hydration; especially if TF remain on hold  NUTRITION DIAGNOSIS:   Inadequate oral intake related to acute illness as evidenced by NPO status.  GOAL:   Patient will meet greater than or equal to 90% of their needs  MONITOR:    (Energy Intake, Anthropometrics, Electrolyte/Renal Profile, Digestive System, Electrolyte/Renal Profile)  REASON FOR ASSESSMENT:   Consult Enteral/tube feeding initiation and management  ASSESSMENT:    Pt tolerating trach collar trials, wanting to eat/drink, SLP evaluating for PMV, received 1 unit of blood yesterday, PEG pending  Diet Order:  Diet NPO time specified   EN: TF on hold since midnight  Skin:   (Stage II pressure ulcer on sacrum)  Digestive System: no signs of TF intolerance  Electrolyte and Renal Profile:  Recent Labs Lab 06/30/15 0608 07/01/15 0423 07/02/15 0636 07/02/15 1139  BUN 74* 76* 76* 72*  CREATININE 0.78 0.83 0.90 0.81  NA 149* 144 152* 152*  K 3.6 3.8 4.1 4.0  MG 1.3* 1.8  --   --   PHOS 4.4 4.9*  --   --    Glucose Profile:  Recent Labs  07/02/15 0348 07/02/15 0739 07/02/15 1111  GLUCAP 113* 120* 98   Meds: ss novolog, neuprogen, lasix, lantus, precedex   Height:   Ht Readings from Last 1 Encounters:  04/27/2015 '5\' 7"'$  (1.702 m)    Weight:   Wt Readings from Last 1 Encounters:  07/02/15 200 lb 6.4 oz (90.9 kg)    Filed Weights   06/30/15 0500 07/01/15 0500 07/02/15 0459  Weight: 207 lb (93.895 kg) 208 lb 1.8 oz (94.4 kg) 200 lb 6.4 oz (90.9 kg)    BMI:  Body mass index is 31.38 kg/(m^2).  Estimated Nutritional Needs:   Kcal:  1254-1596kcals, (11-14kcals/kg) using current weight of 114kg)  Protein:  122-153 g (2.0-2.5 g/kg IBW) or 140-176 g (1.2-1.5 g/kg current wt)  Fluid:  1535-1841m  of fluid (25-371mkg)  EDUCATION NEEDS:   No education needs identified at this time  HIBabson ParkRD, LDN [phone]

## 2015-07-02 NOTE — Consult Note (Addendum)
WOC wound consult note Reason for Consult: Ongoing nonintact injury to left buttocks Wound type:Moisture Associated Skin damage, prolonged illness and debility, requiring ventilator support.  Pressure Ulcer POA: No Measurement:2 nonintact lesions.  Left buttock (original site 3 cm x 2 cm x 0.2 cm) 2 new linear abrasions present to gluteal fold, near left buttock wound.  Consistent with shearing.  Patient has been agitated and moving a lot in bed, per bedside RN.  Wounds are all full thickness at this point.   Wound BMZ:TAEW buttock is 75% yellow thin slough  2 linear abrasions are pink and moist Drainage (amount, consistency, odor) Bleed with cleansing.  Otherwise, minimal serosanguinous.  Patient is diaphoretic and skin stays moist.  Silicone border foam dressing caused maceration.  Periwound:Blanchable erythema Dressing procedure/placement/frequency: Cleanse wound to left buttocks and gluteal fold with NS and pat gently dry.  Apply Santyl to wound base.  Cover with NS moist gauze and secure with dry dressing.  Minimal tape to skin.  Change daily.  Will not follow at this time.  Please re-consult if needed.  Domenic Moras RN BSN [place] Pager [phone]

## 2015-07-02 NOTE — Progress Notes (Signed)
* [facility]  69 year old female with new diagnosis of small cell lung cancer, subsequently intubated due to right lung atelectasis and respiratory failure, because of the lung cancer. She is now status post 2 cycles of chemotherapy to see if this tumor can be shrunk.   Assessment and Plan:  Prolonged VDRF -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, chemotherapy; Significant improvement in airway patency by bronchoscopy 11/22. -Continued ventilator dependence, status post tracheostomy. Started TCT 12/7   Right lung, small cell lung cancer. -The patient did have some response with opening of the right mainstem airway. With the first course of chemotherapy. Her course has been, complicated by severe debility, deconditioning, pancytopenia, severe mucus plugging and overall continued general decline in her status. -After this course of chemotherapy. We will need to reevaluate the patient and see if she may be a candidate to go to long-term acute care facility, and potentially receive more chemotherapy or to change the patient to comfort measures only. -Hematology/oncology currently following, has now completed cycle 2 of cisplatin and etoposide -in addition given dx of small cell lung ca, will need evaluation for radiation treatment -Patient would benefit from radiation therapy, but there is concern for possible patient's safety transportation issues to radiation oncology. -PEG evaluation pending -rad/onc willing to do palliative radiation once completely liberated from the vent.   COPD  -Cont nebulized steroids -Cont nebulized bronchodilators.  PSVT  -Cont scheduled enteral metoprolol  Anemia/leukopenia/thrombocytopenia - pancytopenia - patient starting her nadir from chemo - monitor counts - s/p transfuse 1 unit of prbc 12/6 - heme/onc to consider neulasta  Agitated, delirium. Severe debility and deconditioning - now improving  Volume overload Large TF  residuals - resolved  Sinusitis -Completed 5 day course.   ?Paraneoplastic Syndrome - slowly improving -Has severe weakness - again improving, significantly  Thrush -Completing a 3 day course of diflucan.     Monitor BMET intermittently,PT/OT Monitor I/Os DVT px: SCDs Monitor CBC intermittently Transfuse per usual guidelines   Date: 07/02/2015  MRN# [id number] Emily Pratt 20-Feb-1946   Emily Pratt is a 69 y.o. old female seen in follow up for chief complaint of  Chief Complaint  Patient presents with  . Respiratory Distress     HPI/ROS: The patient is currently on on trach collar trials, no acute complaints, wants to eat.  Awake this morning, states that she is in no pain, no further complaints  Allergies:  Atorvastatin and Rosuvastatin    Physical Examination:   VS: BP 157/77 mmHg  Pulse 83  Temp(Src) 97.6 F (36.4 C) (Oral)  Resp 17  Ht '5\' 7"'$  (1.702 m)  Wt 200 lb 6.4 oz (90.9 kg)  BMI 31.38 kg/m2  SpO2 98%  General Appearance: No distress  Neuro:without focal findings, strength improving bilaterally (4/5). HEENT: PERRLA, EOM intact. Pulmonary: coarse upper airway sounds, mild dec BS on the right while on TCT CardiovascularNormal S1,S2.  No m/r/g.   Abdomen: Benign, Soft, non-tender. Renal:  No costovertebral tenderness  GU:  Not performed at this time. Endoc: No evident thyromegaly, no signs of acromegaly. Skin:   warm, no rash. Extremities: normal, no cyanosis, clubbing.   LABORATORY PANEL:   CBC  Recent Labs Lab 07/01/15 0423  WBC 2.4*  HGB 6.0*  HCT 18.7*  PLT 118*   ------------------------------------------------------------------------------------------------------------------  Chemistries   Recent Labs Lab 06/29/15 0702  07/01/15 0423 07/02/15 0636  NA 146*  < > 144 152*  K 4.2  < >  3.8 4.1  CL 109  < > 108 117*  CO2 28  < > 29 29  GLUCOSE 125*  < > 157* 129*  BUN 76*  < > 76* 76*  CREATININE 0.80  < > 0.83 0.90    CALCIUM 8.5*  < > 8.5* 8.8*  MG  --   < > 1.8  --   AST 60*  --   --   --   ALT 58*  --   --   --   ALKPHOS 140*  --   --   --   BILITOT 0.9  --   --   --   < > = values in this interval not displayed. ------------------------------------------------------------------------------------------------------------------  Cardiac Enzymes No results for input(s): TROPONINI in the last 168 hours. ------------------------------------------------------------  RADIOLOGY:   No results found for this or any previous visit. No results found for this or any previous visit. ------------------------------------------------------------------------------------------------------------------  Thank  you for allowing [facility] to assist in the care of your patient. Our recommendations are noted above.  Please contact us if we can be of further service.  Vilinda Boehringer, MD [facility] Pager [phone] (please enter 7-digits) On Call Pager - [phone] (please enter 7-digits)   [place].  I have personally obtained a history, examined the patient, evaluated laboratory and imaging results, formulated the assessment and plan and placed orders. The case was discussed in detail with the critical care RN, critical care pharmacist, nutrition, case manager, unit manager, and respiratory therapist. The Patient requires high complexity decision making for assessment and support, frequent evaluation and titration of therapies, application of advanced monitoring technologies and extensive interpretation of multiple databases. The patient has critical illness that could lead imminently to failure of 1 or more organ systems and requires the highest level of physician preparedness to intervene.  Critical Care Time devoted to patient care services described in this note is 35 minutes and is exclusive of time spent in procedures.

## 2015-07-03 ENCOUNTER — Inpatient Hospital Stay: Payer: Medicare Other | Admitting: Anesthesiology

## 2015-07-03 ENCOUNTER — Encounter: Admission: EM | Disposition: E | Payer: Self-pay | Source: Home / Self Care | Attending: Pulmonary Disease

## 2015-07-03 ENCOUNTER — Encounter: Payer: Self-pay | Admitting: Anesthesiology

## 2015-07-03 ENCOUNTER — Ambulatory Visit: Admit: 2015-07-03 | Payer: Self-pay | Admitting: Gastroenterology

## 2015-07-03 DIAGNOSIS — Z931 Gastrostomy status: Secondary | ICD-10-CM

## 2015-07-03 HISTORY — PX: PEG PLACEMENT: SHX5437

## 2015-07-03 LAB — BASIC METABOLIC PANEL
Anion gap: 6 (ref 5–15)
Anion gap: 7 (ref 5–15)
BUN: 62 mg/dL — AB (ref 6–20)
BUN: 57 mg/dL — AB (ref 6–20)
CALCIUM: 8.6 mg/dL — AB (ref 8.9–10.3)
CHLORIDE: 121 mmol/L — AB (ref 101–111)
CO2: 28 mmol/L (ref 22–32)
CO2: 28 mmol/L (ref 22–32)
CREATININE: 0.86 mg/dL (ref 0.44–1.00)
CREATININE: 0.81 mg/dL (ref 0.44–1.00)
Calcium: 8.9 mg/dL (ref 8.9–10.3)
Chloride: 120 mmol/L — ABNORMAL HIGH (ref 101–111)
GFR calc Af Amer: >60 mL/min (ref >60)
GFR calc non Af Amer: >60 mL/min (ref >60)
GLUCOSE: 106 mg/dL — AB (ref 65–99)
Glucose, Bld: 109 mg/dL — ABNORMAL HIGH (ref 65–99)
POTASSIUM: 4 mmol/L (ref 3.5–5.1)
Potassium: 3.8 mmol/L (ref 3.5–5.1)
SODIUM: 155 mmol/L — AB (ref 135–145)
Sodium: 155 mmol/L — ABNORMAL HIGH (ref 135–145)

## 2015-07-03 LAB — TYPE AND SCREEN
ABO/RH(D): O POS
ANTIBODY SCREEN: NEGATIVE
Unit division: 0

## 2015-07-03 LAB — PHOSPHORUS: PHOSPHORUS: 4.4 mg/dL (ref 2.5–4.6)

## 2015-07-03 LAB — GLUCOSE, CAPILLARY
Glucose-Capillary: 100 mg/dL — ABNORMAL HIGH (ref 65–99)
Glucose-Capillary: 110 mg/dL — ABNORMAL HIGH (ref 65–99)
Glucose-Capillary: 112 mg/dL — ABNORMAL HIGH (ref 65–99)
Glucose-Capillary: 101 mg/dL — ABNORMAL HIGH (ref 65–99)
Glucose-Capillary: 85 mg/dL (ref 65–99)

## 2015-07-03 LAB — MAGNESIUM: Magnesium: 1.7 mg/dL (ref 1.7–2.4)

## 2015-07-03 SURGERY — INSERTION, PEG TUBE
Anesthesia: General

## 2015-07-03 MED ORDER — CEFAZOLIN (ANCEF) 1 G IV SOLR
1.0000 g | INTRAVENOUS | Status: DC
  Filled 2015-07-03: qty 1

## 2015-07-03 MED ORDER — CEFAZOLIN SODIUM 1-5 GM-% IV SOLN
1.0000 g | Freq: Once | INTRAVENOUS | Status: AC
  Administered 2015-07-03: 1 g via INTRAVENOUS
  Filled 2015-07-03: qty 50

## 2015-07-03 MED ORDER — SODIUM CHLORIDE 0.9 % IV SOLN
INTRAVENOUS | Status: DC | PRN
Start: 2015-07-03 — End: 2015-07-03
  Administered 2015-07-03: 10:00:00 via INTRAVENOUS

## 2015-07-03 MED ORDER — MIDAZOLAM HCL 5 MG/5ML IJ SOLN
INTRAMUSCULAR | Status: DC | PRN
  Administered 2015-07-03 (×2): 2 mg via INTRAVENOUS

## 2015-07-03 MED ORDER — KETAMINE HCL 10 MG/ML IJ SOLN
INTRAMUSCULAR | Status: DC | PRN
  Administered 2015-07-03 (×2): 30 mg via INTRAVENOUS

## 2015-07-03 MED ORDER — DEXMEDETOMIDINE HCL IN NACL 200 MCG/50ML IV SOLN
INTRAVENOUS | Status: DC | PRN
Start: 2015-07-03 — End: 2015-07-03
  Administered 2015-07-03: 1 ug/kg/h via INTRAVENOUS

## 2015-07-03 MED ORDER — ROCURONIUM BROMIDE 100 MG/10ML IV SOLN
INTRAVENOUS | Status: DC | PRN
  Administered 2015-07-03: 20 mg via INTRAVENOUS

## 2015-07-03 NOTE — Transfer of Care (Signed)
Immediate Anesthesia Transfer of Care Note  Patient: Emily Pratt  Procedure(s) Performed: Procedure(s): PERCUTANEOUS ENDOSCOPIC GASTROSTOMY (PEG) PLACEMENT (N/A)  Patient Location: ICU  Anesthesia Type:General  Level of Consciousness: sedated  Airway & Oxygen Therapy: Patient remains intubated per anesthesia plan  Post-op Assessment: Report given to RN and Post -op Vital signs reviewed and stable  Post vital signs: Reviewed and stable  Last Vitals:  Filed Vitals:   06/26/2015 0935 07/16/2015 1015  BP:  125/62  Pulse: 65   Temp:  36.5 C  Resp: 18 17    Complications: No apparent anesthesia complications

## 2015-07-03 NOTE — OR Nursing (Signed)
Peg placement done, # 20 Fr.

## 2015-07-03 NOTE — Plan of Care (Signed)
Problem: ICU Phase Progression Outcomes Goal: O2 sats trending toward baseline Outcome: Progressing On 35% trach collar since 1200. Tolerates well. Moderate amounts of creamy yellow thick secretions. Goal: Voiding-avoid urinary catheter unless indicated Outcome: Progressing Urine output good.  Lasix given as scheduled  Problem: Phase I Progression Outcomes Goal: Pain controlled Outcome: Progressing Denies need for pain med. Goal: Discharge plan established Outcome: Progressing If LTAC to [facility]. Pt would need chemo there.  Pt had PEG placed today. And has been on 35% trach collar. Using passey muir valve at 20 min intervals Goal: Tolerating diet Outcome: Not Progressing  NPO except meds and water flushes after PEG insertion at 1100.  TF and diet are to resume tomorrow. PEG site and abdomen without sign of complication. Minimal c/o pain at PEG site.

## 2015-07-03 NOTE — Consult Note (Signed)
Electrolyte CONSULT NOTE - FOLLOW UP  Pharmacy Consult for Electrolyte monitoring and replacement  Patient Measurements: Height: '5\' 7"'$  (170.2 cm) Weight: 195 lb 9.6 oz (88.724 kg) IBW/kg (Calculated) : 61.6  Vital Signs: Temp: 97.9 F (36.6 C) (12/08 0400) Temp Source: Oral (12/08 0400) BP: 122/78 mmHg (12/08 0600) Pulse Rate: 63 (12/08 0600)  Recent Labs  07/01/15 0423 07/02/15 0636 07/02/15 1139 07/14/2015 0512  NA 144 152* 152* 155*  K 3.8 4.1 4.0 4.0  CL 108 117* 116* 121*  CO2 '29 29 29 28  '$ GLUCOSE 157* 129* 114* 109*  BUN 76* 76* 72* 62*  CREATININE 0.83 0.90 0.81 0.86  CALCIUM 8.5* 8.8* 9.2 8.9  MG 1.8  --   --  1.7  PHOS 4.9*  --   --  4.4   Estimated Creatinine Clearance: 72.6 mL/min (by C-G formula based on Cr of 0.86).   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 Pharmacy consulted for monitoring and managing electrolytes.   Plan:  Electrolytes are wnl. Will follow up in 2 days.  Anapaula Severt A. [place], [place].D., BCPS Clinical Pharmacist  07/21/2015

## 2015-07-03 NOTE — Op Note (Signed)
[facility] Patient Name: Emily Pratt Procedure Date: 06/26/2015 9:38 AM MRN: [id number] Account #: [id number] Date of Birth: 1945-12-20 Admit Type: Inpatient Age: 69 Room: [facility] ENDO ROOM 3 Gender: Female Note Status: Finalized Procedure:         Upper GI endoscopy Indications:       Place PEG due to impaired swallowing, Place PEG because                     patient requires ventilator support Patient Profile:   This is a 69 year old female. Providers:         Gerrit Heck. Rayann Heman, MD Referring MD:      Forest Gleason Md, MD (Referring MD) Medicines:         General Anesthesia Complications:     No immediate complications. Procedure:         Pre-Anesthesia Assessment:                    - Prior to the procedure, a History and Physical was                     performed, and patient medications, allergies and                     sensitivities were reviewed. The patient's tolerance of                     previous anesthesia was reviewed.                    After obtaining informed consent, the endoscope was passed                     under direct vision. Throughout the procedure, the                     patient's blood pressure, pulse, and oxygen saturations                     were monitored continuously. The Olympus GIF-160 endoscope                     (S#. [id number]) was introduced through the mouth, and                     advanced to the duodenal bulb. The upper GI endoscopy was                     accomplished without difficulty. The patient tolerated the                     procedure well. Husband Laveda Demedeiros consented on the                     phone. Findings:      The esophagus was normal.      Hematin (altered blood/coffee-ground-like material) was found in the       gastric body.      The examined duodenum was normal.      The patient was placed in the supine position for PEG placement. The       stomach was insufflated to appose gastric and  abdominal walls. A site       was located in the body of the stomach with excellent transillumination  and manual external pressure for placement. The abdominal wall was       marked and prepped in a sterile manner. The area was anesthetized with 3       mL of 1% lidocaine. The trocar needle was introduced through the       abdominal wall and into the stomach under direct endoscopic view. A       snare was introduced through the endoscope and opened in the gastric       lumen. The guide wire was passed through the trocar and into the open       snare. The snare was closed around the guide wire. The endoscope and       snare were removed, pulling the wire out through the mouth. A skin       incision was made at the site of needle insertion. The externally       removable 20 Fr EndoVive Safety gastrostomy tube was lubricated. The       G-tube was tied to the guide wire and pulled through the mouth and into       the stomach. The trocar needle was removed, and the gastrostomy tube was       pulled out from the stomach through the skin. The external bumper was       attached to the gastrostomy tube, and the tube was cut to remove the       guide wire. The final position of the gastrostomy tube was confirmed by       relook endoscopy, and skin marking noted to be 1.5 cm at the external       bumper. The final tension and compression of the abdominal wall by the       PEG tube and external bumper were checked and revealed that the bumper       was loose and lightly touching the skin. The feeding tube was capped,       and the tube site cleaned and dressed. Impression:        - Normal esophagus.                    - Hematin (altered blood/coffee-ground-like material) in                     the gastric body.                    - Normal examined duodenum.                    - An externally removable PEG placement was successfully                     completed.                    - No  specimens collected. Recommendation:    - Return patient to ICU for ongoing care.                    - Please follow the post-PEG recommendations including:                     Nutrition consult for formula and volume, antibiotic                     ointment to site, change dressing once per day, check site  for bleeding q 4 hrs, remove dressing after 2 weeks, NPO                     x4 hrs then water today and may use PEG tomorrow for                     feedings.                    - Start prilosec 20 mg daily for old blood with no source                     in stomach. ( ? NG trauma).. Procedure Code(s): --- Professional ---                    [phone], Esophagogastroduodenoscopy, flexible, transoral;                     with directed placement of percutaneous gastrostomy tube Diagnosis Code(s): --- Professional ---                    K92.2, Gastrointestinal hemorrhage, unspecified                    R13.10, Dysphagia, unspecified                    Z43.1, Encounter for attention to gastrostomy                    Z99.11, Dependence on respirator [ventilator] status CPT copyright 2014 American Medical Association. All rights reserved. The codes documented in this report are preliminary and upon coder review may  be revised to meet current compliance requirements. Mellody Life, MD 07/14/2015 10:43:38 AM This report has been signed electronically. Number of Addenda: 0 Note Initiated On: 06/27/2015 9:38 AM      [facility]

## 2015-07-03 NOTE — Progress Notes (Signed)
PT Cancellation Note  Patient Details Name: Emily Pratt MRN: [id number] DOB: 1946/06/02   Cancelled Treatment:    Reason Eval/Treat Not Completed: Medical issues which prohibited therapy (Per chart review, patient status post PEG placement (with orders for continued therapy received) this date.  Will hold therapy this date to allow for stability/comfort post-op and will re-attempt next date as available/medically appropriate as time allow)  Nahun Kronberg H. Owens Shark, PT, DPT, NCS 07/12/2015, 3:20 PM [phone]

## 2015-07-03 NOTE — Care Management (Signed)
PMV placement today and tolerating without desats.  PEG placed this morning.

## 2015-07-03 NOTE — Anesthesia Preprocedure Evaluation (Signed)
Anesthesia Evaluation  Patient identified by MRN, date of birth, ID band Patient awake    Reviewed: Allergy & Precautions, H&P , NPO status , Patient's Chart, lab work & pertinent test results  History of Anesthesia Complications Negative for: history of anesthetic complications  Airway Mallampati: II  TM Distance: >3 FB Neck ROM: full    Dental no notable dental hx. (+) Teeth Intact   Pulmonary pneumonia, COPD, Current Smoker,    Pulmonary exam normal breath sounds clear to auscultation       Cardiovascular Exercise Tolerance: Poor hypertension, +CHF  Normal cardiovascular exam Rhythm:regular Rate:Normal     Neuro/Psych PSYCHIATRIC DISORDERS Anxiety Depression  Neuromuscular disease negative psych ROS   GI/Hepatic Neg liver ROS, GERD  Controlled,  Endo/Other  negative endocrine ROSdiabetes, Type 2  Renal/GU Renal diseasenegative Renal ROS  negative genitourinary   Musculoskeletal  (+) Arthritis ,   Abdominal   Peds  Hematology negative hematology ROS (+)   Anesthesia Other Findings Past Medical History:   Anxiety                                                      Arthritis                                                    COPD (chronic obstructive pulmonary disease) (*              CHF (congestive heart failure) (HCC)                         Hyperlipidemia                                               Hypertension                                                 Diabetes mellitus without complication (HCC)                 Chronic kidney disease                                       Neuromuscular disorder ([place])                                Past Surgical History:   JOINT REPLACEMENT                               Bilateral 2006           Comment:both knees   ANKLE SURGERY  TRACHEOSTOMY TUBE PLACEMENT                     N/A 06/21/2015     Comment:Procedure:  TRACHEOSTOMY;  Surgeon: Margaretha Sheffield, MD;  Location: [facility] ORS;  Service:               ENT;  Laterality: N/A;  BMI    Body Mass Index   30.62 kg/m 2      Reproductive/Obstetrics negative OB ROS                             Anesthesia Physical Anesthesia Plan  ASA: IV  Anesthesia Plan: General   Post-op Pain Management:    Induction:   Airway Management Planned:   Additional Equipment:   Intra-op Plan:   Post-operative Plan:   Informed Consent: I have reviewed the patients History and Physical, chart, labs and discussed the procedure including the risks, benefits and alternatives for the proposed anesthesia with the patient or authorized representative who has indicated his/her understanding and acceptance.   Dental Advisory Given  Plan Discussed with: Anesthesiologist, CRNA and Surgeon  Anesthesia Plan Comments: (Husband consented over the phone.)        Anesthesia Quick Evaluation

## 2015-07-03 NOTE — Progress Notes (Addendum)
* [facility]  69 year old female with new diagnosis of small cell lung cancer, subsequently intubated due to right lung atelectasis and respiratory failure, because of the lung cancer. She is now status post 2 cycles of chemotherapy to see if this tumor can be shrunk.   Assessment and Plan:  Prolonged VDRF -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, chemotherapy; Significant improvement in airway patency by bronchoscopy 11/22. -Continued ventilator dependence, status post tracheostomy. Started TCT 12/7 - tolerated 4 hrs   Right lung, small cell lung cancer. -The patient did have some response with opening of the right mainstem airway. With the first course of chemotherapy. Her course has been, complicated by severe debility, deconditioning, pancytopenia, severe mucus plugging and overall continued general decline in her status. -After this course of chemotherapy. We will need to reevaluate the patient and see if she may be a candidate to go to long-term acute care facility, and potentially receive more chemotherapy or to change the patient to comfort measures only. -Hematology/oncology currently following, has now completed cycle 2 of cisplatin and etoposide -in addition given dx of small cell lung ca, will need evaluation for radiation treatment -Patient would benefit from radiation therapy, but there is concern for possible patient's safety transportation issues to radiation oncology. -s/p PEG 07/10/2015 -rad/onc willing to do palliative radiation once completely liberated from the vent.   COPD  -Cont nebulized steroids -Cont nebulized bronchodilators.  PSVT  -Cont scheduled enteral metoprolol  Anemia/leukopenia/thrombocytopenia - pancytopenia - patient starting her nadir from chemo - monitor counts - s/p transfuse 1 unit of prbc 12/6 - heme/onc started neulasta  Agitated, delirium. Severe debility and deconditioning - now improving  Hypernatremia - s/p PEG  placement, will start Free H2O flushes (250cc q6hrs).   Volume overload Large TF residuals - resolved  Sinusitis -Completed 5 day course.   ?Paraneoplastic Syndrome - slowly improving -Has severe weakness - again improving, significantly  Thrush -Completing a 3 day course of diflucan.     Monitor BMET intermittently,PT/OT Monitor I/Os DVT px: SCDs Monitor CBC intermittently Transfuse per usual guidelines   Date: 06/26/2015  MRN# [id number] ADDELYN ALLEMAN 21-Dec-1945   Alandria Butkiewicz Jagodzinski is a 69 y.o. old female seen in follow up for chief complaint of  Chief Complaint  Patient presents with  . Respiratory Distress     HPI/ROS: S/p peg placement this morning.  Currently on PSV, might try TCT this afternoon.   Allergies:  Atorvastatin and Rosuvastatin    Physical Examination:   VS: BP 129/63 mmHg  Pulse 61  Temp(Src) 97.7 F (36.5 C) (Axillary)  Resp 17  Ht '5\' 7"'$  (1.702 m)  Wt 195 lb 9.6 oz (88.724 kg)  BMI 30.63 kg/m2  SpO2 100%  General Appearance: No distress  Neuro:without focal findings, strength improving bilaterally (4/5). HEENT: PERRLA, EOM intact. Pulmonary: coarse upper airway sounds, upper airway congestion,  CardiovascularNormal S1,S2.  No m/r/g.   Abdomen: Benign, Soft, mild tender, LUQ PEG in place.  Renal:  No costovertebral tenderness  GU:  Not performed at this time. Endoc: No evident thyromegaly, no signs of acromegaly. Skin:   warm, no rash. Extremities: normal, no cyanosis, clubbing.   LABORATORY PANEL:   CBC  Recent Labs Lab 07/02/15 1139  WBC 11.9*  HGB 8.2*  HCT 25.9*  PLT 116*   ------------------------------------------------------------------------------------------------------------------  Chemistries   Recent Labs Lab 06/29/15 0702  07/25/2015 0512  NA 146*  < > 155*  K 4.2  < >  4.0  CL 109  < > 121*  CO2 28  < > 28  GLUCOSE 125*  < > 109*  BUN 76*  < > 62*  CREATININE 0.80  < > 0.86  CALCIUM 8.5*  < > 8.9    MG  --   < > 1.7  AST 60*  --   --   ALT 58*  --   --   ALKPHOS 140*  --   --   BILITOT 0.9  --   --   < > = values in this interval not displayed. ------------------------------------------------------------------------------------------------------------------  Cardiac Enzymes No results for input(s): TROPONINI in the last 168 hours. ------------------------------------------------------------  RADIOLOGY:   No results found for this or any previous visit. No results found for this or any previous visit. ------------------------------------------------------------------------------------------------------------------  Thank  you for allowing [facility] to assist in the care of your patient. Our recommendations are noted above.  Please contact us if we can be of further service.  Vilinda Boehringer, MD [facility] Pager [phone] (please enter 7-digits) On Call Pager - [phone] (please enter 7-digits)   [place].  I have personally obtained a history, examined the patient, evaluated laboratory and imaging results, formulated the assessment and plan and placed orders. The case was discussed in detail with the critical care RN, critical care pharmacist, nutrition, case manager, unit manager, and respiratory therapist. The Patient requires high complexity decision making for assessment and support, frequent evaluation and titration of therapies, application of advanced monitoring technologies and extensive interpretation of multiple databases. The patient has critical illness that could lead imminently to failure of 1 or more organ systems and requires the highest level of physician preparedness to intervene.  Critical Care Time devoted to patient care services described in this note is 35 minutes and is exclusive of time spent in procedures.

## 2015-07-03 NOTE — Progress Notes (Signed)
Pt returned to PSV 10/5 on the ventilator for rest. Pt tolerated ATC well

## 2015-07-03 NOTE — Anesthesia Postprocedure Evaluation (Signed)
Anesthesia Post Note  Patient: Emily Pratt  Procedure(s) Performed: Procedure(s) (LRB): PERCUTANEOUS ENDOSCOPIC GASTROSTOMY (PEG) PLACEMENT (N/A)  Patient location during evaluation: SICU Anesthesia Type: General Level of consciousness: sedated Pain management: pain level controlled Vital Signs Assessment: post-procedure vital signs reviewed and stable Respiratory status: patient on ventilator - see flowsheet for VS Cardiovascular status: stable Anesthetic complications: no    Last Vitals:  Filed Vitals:   07/09/2015 0935 07/20/2015 1015  BP:  125/62  Pulse: 65   Temp:  36.5 C  Resp: 18 17    Last Pain:  Filed Vitals:   07/24/2015 1017  PainSc: 0-No pain                 Precious Haws Piscitello

## 2015-07-03 NOTE — H&P (Signed)
Primary Care Physician:  Dagoberto Ligas, MD  Pre-Procedure History & Physical: HPI:  Emily Pratt is a 69 y.o. female for PEG placement. .   Past Medical History  Diagnosis Date  . Anxiety   . Arthritis   . COPD (chronic obstructive pulmonary disease) ([place])   . CHF (congestive heart failure) ([place])   . Hyperlipidemia   . Hypertension   . Diabetes mellitus without complication ([place])   . Chronic kidney disease   . Neuromuscular disorder [facility])     Past Surgical History  Procedure Laterality Date  . Joint replacement Bilateral 2006    both knees  . Ankle surgery    . Tracheostomy tube placement N/A 06/08/2015    Procedure: TRACHEOSTOMY;  Surgeon: Margaretha Sheffield, MD;  Location: [facility] ORS;  Service: ENT;  Laterality: N/A;    Prior to Admission medications   Medication Sig Start Date End Date Taking? Authorizing Provider  albuterol (PROVENTIL) (2.5 MG/3ML) 0.083% nebulizer solution Take 3 mLs (2.5 mg total) by nebulization every 6 (six) hours as needed for wheezing or shortness of breath. 05/07/15  Yes Roselee Nova, MD  allopurinol (ZYLOPRIM) 300 MG tablet Take 1 tablet (300 mg total) by mouth daily. 03/06/15  Yes Roselee Nova, MD  budesonide-formoterol [facility]) 160-4.5 MCG/ACT inhaler Inhale 2 puffs into the lungs 2 (two) times daily. 04/24/15  Yes Roselee Nova, MD  colchicine 0.6 MG tablet Take 1 tablet (0.6 mg total) by mouth daily. 03/06/15  Yes Roselee Nova, MD  furosemide (LASIX) 80 MG tablet Take 80 mg by mouth daily.  11/06/14  Yes Historical Provider, MD  JANUVIA 50 MG tablet Take 1 tablet (50 mg total) by mouth daily. 03/06/15  Yes Roselee Nova, MD  losartan (COZAAR) 100 MG tablet Take 1 tablet (100 mg total) by mouth daily. 03/06/15  Yes Roselee Nova, MD  LYRICA 50 MG capsule Take 1 capsule (50 mg total) by mouth 3 (three) times daily. 03/06/15  Yes Roselee Nova, MD  metoprolol (LOPRESSOR) 100 MG tablet Take 1 tablet (100 mg total) by mouth 2 (two)  times daily. 03/06/15  Yes Roselee Nova, MD  oxyCODONE-acetaminophen (PERCOCET) 10-325 MG tablet Take 1 tablet by mouth every 6 (six) hours as needed for pain. 05/07/15  Yes Roselee Nova, MD  pravastatin (PRAVACHOL) 10 MG tablet Take 10 mg by mouth daily.   Yes Historical Provider, MD  tiotropium (SPIRIVA HANDIHALER) 18 MCG inhalation capsule Place 1 capsule (18 mcg total) into inhaler and inhale daily. 05/09/15  Yes Roselee Nova, MD  VENTOLIN HFA 108 (90 BASE) MCG/ACT inhaler Inhale 1 puff into the lungs every 4 (four) hours as needed.  03/25/15  Yes Historical Provider, MD  predniSONE (DELTASONE) 10 MG tablet Take 1 tablet (10 mg total) by mouth daily with breakfast. Patient not taking: Reported on 05/18/2015 04/25/15   Bettey Costa, MD    Allergies as of 05/16/2015 - Review Complete 05/15/2015  Allergen Reaction Noted  . Atorvastatin Other (See Comments) 01/10/2015  . Fentanyl Itching 01/10/2015  . Rosuvastatin Anxiety 01/10/2015    Family History  Problem Relation Age of Onset  . Diabetes Mother   . Cancer Father   . Diabetes Sister   . Stroke Sister     Social History   Social History  . Marital Status: Married    Spouse Name: N/A  . Number of Children: N/A  . Years  of Education: N/A   Occupational History  . Not on file.   Social History Main Topics  . Smoking status: Current Every Day Smoker    Types: Cigarettes  . Smokeless tobacco: Not on file     Comment: 0.5 pack /day  . Alcohol Use: No  . Drug Use: No  . Sexual Activity: Not on file   Other Topics Concern  . Not on file   Social History Narrative   Lives at home with husband.   Has a walker     Physical Exam: BP 123/66 mmHg  Pulse 63  Temp(Src) 97.5 F (36.4 C) (Axillary)  Resp 15  Ht '5\' 7"'$  (1.702 m)  Wt 88.724 kg (195 lb 9.6 oz)  BMI 30.63 kg/m2  SpO2 95% General:   On vent, a and o x 0 Neck:  Supple; no masses or thyromegaly. Lungs:  Coarse vent sounds   Heart:  Regular rate and  rhythm. Abdomen:  Soft, nontender and nondistended. Normal bowel sounds, without guarding, and without rebound.   Neurologic:  Alert and  oriented x4;  grossly normal neurologically.  Impression/Plan: [name] is here for an PEG to be performed for dysphagia, chronic ventilator  Risks, benefits, limitations, and alternatives regarding  PEG have been reviewed with the patient's husband Chantay Whitelock.  Questions have been answered.  All parties agreeable.   Josefine Class, MD  07/26/2015, 9:35 AM

## 2015-07-03 NOTE — Progress Notes (Signed)
Speech Language Pathology Treatment: Nada Boozer Speaking valve  Patient Details Name: Emily Pratt MRN: [id number] DOB: [birth year] Today's Date: 07/26/2015 Time: 1638-4665 SLP Time Calculation (min) (ACUTE ONLY): 45 min  Assessment / Plan / Recommendation Clinical Impression  Pt appears to be tolerating PMV placement today w/ no desaturation or discomfort noted during wear. She was able to phonate then converse verbally w/ staff and granddaughter in room w/ intelligibility but min. Reduced breath support for speech - suspect could be impacted by the fact that pt has a #7 trach size in place impeding some air movement superiorly for speech. Educated pt on need for rest breaks during wear and b/t wear. During placement, HR remained mid80's; RR 18-22; O2 sats 95-96%. After approx. 30 mins., PMV was removed to allow pt to rest. Thoroughly discussed w/ pt/family member and NSG the PMV precautions as posted and to not sleep w/ PMV placed. Precautions posted in chart orders for NSG. ST will f/u w/ toleration and education of use/wear of the PMV, esp. when able to take po's next 1-2 days. Pt and NSG agreed.    HPI HPI: Pt now has a tracheostomy and PEG was placed this morning per MD. Pt is awaiting clearance for po trials/BSE tomorrow.       SLP Plan  Goals updated     Recommendations   PMV wear w/ 100% supervision for short periods of time; strict precautions as posted in chart and room for wear and care of PMV including NOT sleeping w/ PMV placed.       Patient may use Passy-Muir Speech Valve: Intermittently with supervision;Caregiver trained to provide supervision PMSV Supervision: Full MD: Please consider changing trach tube to : Smaller size (when appropriate)       General recommendations: Rehab consult Oral Care Recommendations: Oral care QID;Staff/trained caregiver to provide oral care Follow up Recommendations: Skilled Nursing facility Plan: Goals updated    Watson,Katherine 07/09/2015, 3:22 PM Orinda Kenner, [place], CCC-SLP

## 2015-07-04 LAB — GLUCOSE, CAPILLARY
Glucose-Capillary: 97 mg/dL (ref 65–99)
Glucose-Capillary: 92 mg/dL (ref 65–99)
Glucose-Capillary: 98 mg/dL (ref 65–99)
Glucose-Capillary: 108 mg/dL — ABNORMAL HIGH (ref 65–99)
Glucose-Capillary: 120 mg/dL — ABNORMAL HIGH (ref 65–99)
Glucose-Capillary: 127 mg/dL — ABNORMAL HIGH (ref 65–99)

## 2015-07-04 MED ORDER — ZIPRASIDONE MESYLATE 20 MG IM SOLR
10.0000 mg | Freq: Once | INTRAMUSCULAR | Status: AC
Start: 2015-07-04 — End: 2015-07-04
  Administered 2015-07-04: 10 mg via INTRAMUSCULAR
  Filled 2015-07-04: qty 20

## 2015-07-04 MED ORDER — INSULIN GLARGINE 100 UNIT/ML ~~LOC~~ SOLN
10.0000 [IU] | Freq: Once | SUBCUTANEOUS | Status: AC
  Administered 2015-07-04: 10 [IU] via SUBCUTANEOUS
  Filled 2015-07-04: qty 0.1

## 2015-07-04 MED ORDER — ZIPRASIDONE MESYLATE 20 MG IM SOLR
10.0000 mg | Freq: Once | INTRAMUSCULAR | Status: AC
  Administered 2015-07-05: 10 mg via INTRAMUSCULAR
  Filled 2015-07-04: qty 20

## 2015-07-04 MED ORDER — VITAL HIGH PROTEIN PO LIQD
1000.0000 mL | ORAL | Status: DC
  Administered 2015-07-04 – 2015-07-05 (×2): 1000 mL

## 2015-07-04 MED ORDER — ZIPRASIDONE MESYLATE 20 MG IM SOLR: 20.0000 mg | Freq: Once | INTRAMUSCULAR | Status: DC

## 2015-07-04 NOTE — Clinical Social Work Note (Signed)
Patient continues with same course in ICU. Continues on vent and requiring courses of chemo. Nothing for CSW to offer at this time. CSW will continue to follow in the event patient becomes able to discharge from hospital.  Shela Leff MSW,LCSW [phone]

## 2015-07-04 NOTE — Progress Notes (Signed)
RN Pam placed patient back on vent PSV 10/5 30% due to respiratory distress.

## 2015-07-04 NOTE — Progress Notes (Signed)
RT to room to place patient on 40% ATC per verbal order from Dr. Stevenson Clinch.  Patient tolerating ATC well at this time, patient suctioned, will continue to monitor

## 2015-07-04 NOTE — Plan of Care (Signed)
Problem: ICU Phase Progression Outcomes Goal: O2 sats trending toward baseline Outcome: Progressing Short trach trial today due to agitation and combativeness requiring versed and fentanyl. Sats good on vent 30% Goal: Voiding-avoid urinary catheter unless indicated Outcome: Progressing Aggressive diuresis and delerium. Foley in place  Problem: Phase I Progression Outcomes Goal: Dyspnea controlled at rest Outcome: Progressing Dyspneic with agitation Goal: Pain controlled Outcome: Progressing Fentanyl for pain using PCOT scale Goal: Progress activity as tolerated unless otherwise ordered Outcome: Progressing Very active with upper extremities today.  Frequently swings arms at nurses. Frequently attempts to dc vent tubing. Turned q 2hr. Goal: Discharge plan established Outcome: Progressing See care manager notes. Continues to be on vent with trach collar trials.  Has had 2 rounds of her chemo Goal: Tolerating diet Outcome: Progressing TF restarted at 68m/hr via PEG. So far tolerating Goal: Other Phase II Outcomes/Goals Outcome: Progressing Agitated, swears, combative, restless. PRN Fentanyl and versed in addition to her precedex gtt. Geodon added this afternoon

## 2015-07-04 NOTE — Progress Notes (Signed)
Nutrition Follow-up    INTERVENTION:   EN: plan to restart TF today via PEG at rate of 20 ml/hr, goal rate of 60 ml/hr as previously recommended. Continue Pro-stat supplementation. Continue free water flushes as per MD order Coordination of Care: hypernatremia persists, recommend addition of D5W to provide free water until hypernatremia resolved.    NUTRITION DIAGNOSIS:   Inadequate oral intake related to acute illness as evidenced by NPO status. Being addressed via TF  GOAL:   Patient will meet greater than or equal to 90% of their needs  MONITOR:    (Energy Intake, Anthropometrics, Electrolyte/Renal Profile, Digestive System, Electrolyte/Renal Profile)  REASON FOR ASSESSMENT:   Consult Enteral/tube feeding initiation and management  ASSESSMENT:    Pt remains on vent via trach, trach collar trials as tolerated, working with SLP this AM, allowed sips of liquids, s/p PEG placement yesterday, remains on precedex  Diet Order:  Diet NPO time specified   EN: TF remains on hold post PEG  Skin:  Stage III pressure ulcer  Digestive System: rectal tube, +BM, abdomen soft, obese, BS active  Urine Volume: UOP 2 Liters in 24 hours  Glucose Profile:  Recent Labs  07/06/2015 2015 07/04/15 0019 07/04/15 0750  GLUCAP 98 97 92   Electrolyte and Renal Profile:  Recent Labs Lab 06/30/15 0608 07/01/15 0423  07/02/15 1139 07/21/2015 0512 07/19/2015 2021  BUN 74* 76*  < > 72* 62* 57*  CREATININE 0.78 0.83  < > 0.81 0.86 0.81  NA 149* 144  < > 152* 155* 155*  K 3.6 3.8  < > 4.0 4.0 3.8  MG 1.3* 1.8  --   --  1.7  --   PHOS 4.4 4.9*  --   --  4.4  --   < > = values in this interval not displayed.   Meds: precedex, lasix, ss novolog, lantus  Height:   Ht Readings from Last 1 Encounters:  05/03/2015 '5\' 7"'$  (1.702 m)    Weight:   Wt Readings from Last 1 Encounters:  07/22/2015 195 lb 9.6 oz (88.724 kg)   BMI:  Body mass index is 30.63 kg/(m^2).  Estimated Nutritional Needs:    Kcal:  1254-1596kcals, (11-14kcals/kg) using current weight of 114kg)  Protein:  122-153 g (2.0-2.5 g/kg IBW) or 140-176 g (1.2-1.5 g/kg current wt)  Fluid:  1535-1876m of fluid (25-343mkg)  EDUCATION NEEDS:   No education needs identified at this time  HIPoint PlaceRD, LDN [phone]

## 2015-07-04 NOTE — Progress Notes (Signed)
* Emily Pratt  69 year old female with new diagnosis of small cell lung cancer, subsequently intubated due to right lung atelectasis and respiratory failure, because of the lung cancer. She is now status post 2 cycles of chemotherapy to see if this tumor can be shrunk.   Assessment and Plan:  Prolonged VDRF -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, chemotherapy; Significant improvement in airway patency by bronchoscopy 11/22. -Continued ventilator dependence, status post tracheostomy. Started TCT 12/7 - tolerated 8 hrs yesterday, goal today's 12-16 hours of trach collar trials and then resting and on the vent   Right lung, small cell lung cancer. -The patient did have some response with opening of the right mainstem airway. With the first course of chemotherapy. Her course has been, complicated by severe debility, deconditioning, pancytopenia, severe mucus plugging and overall continued general decline in her status. -After this course of chemotherapy. We will need to reevaluate the patient and see if she may be a candidate to go to long-term acute care facility, and potentially receive more chemotherapy or to change the patient to comfort measures only. -Hematology/oncology currently following, has now completed cycle 2 of cisplatin and etoposide -in addition given dx of small cell lung ca, will need evaluation for radiation treatment -Patient would benefit from radiation therapy, but there is concern for possible patient's safety transportation issues to radiation oncology. -s/p PEG 07/14/2015 -rad/onc willing to do palliative radiation once completely liberated from the vent.   COPD  -Cont nebulized steroids -Cont nebulized bronchodilators.  PSVT  -Cont scheduled enteral metoprolol  Anemia/leukopenia/thrombocytopenia - pancytopenia - patient starting her nadir from chemo - monitor counts - s/p transfuse 1 unit of prbc 12/6 - heme/onc started  neulasta  Agitated, delirium. Severe debility and deconditioning - now improving  Hypernatremia - s/p PEG placement, will start Free H2O flushes (250cc q6hrs).   Volume overload Large TF residuals - resolved  Sinusitis -Completed 5 day course.   ?Paraneoplastic Syndrome - slowly improving -Has severe weakness - again improving, significantly  Thrush -Completing a 3 day course of diflucan.     Monitor BMET intermittently,PT/OT Monitor I/Os DVT px: SCDs Monitor CBC intermittently Transfuse per usual guidelines   Date: 07/04/2015  MRN# [id number] Emily Pratt September 19, 1945   Emily Pratt is a 69 y.o. old female seen in follow up for chief complaint of  Chief Complaint  Patient presents with  . Respiratory Distress     HPI/ROS: Currently on PSV, TCT today, acute complaints  Allergies:  Atorvastatin and Rosuvastatin    Physical Examination:   VS: BP 117/60 mmHg  Pulse 60  Temp(Src) 97.9 F (36.6 C) (Axillary)  Resp 17  Ht '5\' 7"'$  (1.702 m)  Wt 195 lb 9.6 oz (88.724 kg)  BMI 30.63 kg/m2  SpO2 94%  General Appearance: No distress  Neuro:without focal findings, strength improving bilaterally (4/5). HEENT: PERRLA, EOM intact. Pulmonary: coarse upper airway sounds, upper airway congestion,  CardiovascularNormal S1,S2.  No m/r/g.   Abdomen: Benign, Soft, mild tender, LUQ PEG in place.  Renal:  No costovertebral tenderness  GU:  Not performed at this time. Endoc: No evident thyromegaly, no signs of acromegaly. Skin:   warm, no rash. Extremities: normal, no cyanosis, clubbing.   LABORATORY PANEL:   CBC  Recent Labs Lab 07/02/15 1139  WBC 11.9*  HGB 8.2*  HCT 25.9*  PLT 116*   ------------------------------------------------------------------------------------------------------------------  Chemistries   Recent Labs Lab 06/29/15 0702  07/19/2015 0512 07/12/2015 2021  NA 146*  < > 155* 155*  K 4.2  < > 4.0 3.8  CL 109  < > 121* 120*  CO2 28  <  > 28 28  GLUCOSE 125*  < > 109* 106*  BUN 76*  < > 62* 57*  CREATININE 0.80  < > 0.86 0.81  CALCIUM 8.5*  < > 8.9 8.6*  MG  --   < > 1.7  --   AST 60*  --   --   --   ALT 58*  --   --   --   ALKPHOS 140*  --   --   --   BILITOT 0.9  --   --   --   < > = values in this interval not displayed. ------------------------------------------------------------------------------------------------------------------  Cardiac Enzymes No results for input(s): TROPONINI in the last 168 hours. ------------------------------------------------------------  RADIOLOGY:   No results found for this or any previous visit. No results found for this or any previous visit. ------------------------------------------------------------------------------------------------------------------  Thank  you for allowing [facility] to assist in the care of your patient. Our recommendations are noted above.  Please contact us if we can be of further service.  Vilinda Boehringer, MD [facility] [facility] Pager [phone] (please enter 7-digits) On Call Pager - [phone] (please enter 7-digits)   [place].  I have personally obtained a history, examined the patient, evaluated laboratory and imaging results, formulated the assessment and plan and placed orders. The case was discussed in detail with the critical care RN, critical care pharmacist, nutrition, case manager, unit manager, and respiratory therapist. The Patient requires high complexity decision making for assessment and support, frequent evaluation and titration of therapies, application of advanced monitoring technologies and extensive interpretation of multiple databases. The patient has critical illness that could lead imminently to failure of 1 or more organ systems and requires the highest level of physician preparedness to intervene.  Critical Care Time devoted to patient care services described in  this note is 35 minutes and is exclusive of time spent in procedures.

## 2015-07-04 NOTE — Evaluation (Addendum)
Clinical/Bedside Swallow Evaluation Patient Details  Name: Emily Pratt MRN: [id number] Date of Birth: 03/16/1946  Today's Date: 07/04/2015 Time: SLP Start Time (ACUTE ONLY): 0945 SLP Stop Time (ACUTE ONLY): 1045 SLP Time Calculation (min) (ACUTE ONLY): 60 min  Past Medical History:  Past Medical History  Diagnosis Date  . Anxiety   . Arthritis   . COPD (chronic obstructive pulmonary disease) ([place])   . CHF (congestive heart failure) ([place])   . Hyperlipidemia   . Hypertension   . Diabetes mellitus without complication ([place])   . Chronic kidney disease   . Neuromuscular disorder [facility])    Past Surgical History:  Past Surgical History  Procedure Laterality Date  . Joint replacement Bilateral 2006    both knees  . Ankle surgery    . Tracheostomy tube placement N/A 06/09/2015    Procedure: TRACHEOSTOMY;  Surgeon: Margaretha Sheffield, MD;  Location: [facility] ORS;  Service: ENT;  Laterality: N/A;   HPI:  Vent dependent trach secondary to lung cancer, prolonged intubation. Pt now has a tracheostomy and PEG was placed yesterday. MD gave ok for BSE sec. to pt wanting thin liquids - "something to drink". Pt continues to be confused w/ agitated behavior often requiring meds for agitation; Sitter present.    Assessment / Plan / Recommendation Clinical Impression  Pt was seen for a BSE today; she was agreeable post education given. NSG had recently given calming meds. PMV was placed for this eval - rec. PMV placement for any/all po intake. Pt appeared to tolerate the PMV placement w/out discomfort. Pt was then positioned upright for po trials. Pt appeared to tolerate the trials of ice chips w/ no immediate/delayed, overt s/s of aspiration. When given trials of thin liquids via cup and straw, pt exhibited delayed coughing despite aspiration precautions. Noted decline in O2 sats(probe had a poor signal per NSG). No discomfort or wet vocal quality noted, however, pt more agitated stating she was "leaving" and   that "no one could keep her here". No further trials given at that time sec. to agitation. After the eval, pt was more agitated and having a declinein respiatory status requiring vent support. Due to pt's fluctuating mental status and agitation as well as concern for high risk for aspiration, rec. continued NPO status w/ pleasure ice chips w/ NSG Only following strict aspiration precautions(stop if coughing or s/s of aspiration noted). ST will continue to f/u w/ trials of po's to upgrade to an oral diet if safely possible. Pt may benefit from an objective assessment next week. NSG updated.    Aspiration Risk  Severe aspiration risk    Diet Recommendation  NPO status w/ pleasure, single ice chips PRN w/ 100% NSG supervision; strict aspiration precautions and stop feeding if s/s of aspiration noted. Oral care prior to any po's. Pt MUST wear PMV for any po's.  Medication Administration: Via alternative means    Other  Recommendations Oral Care Recommendations: Oral care QID;Staff/trained caregiver to provide oral care Other Recommendations:  (single ice chips for pleasure post oral care; w/ NSG only)   Follow up Recommendations  Skilled Nursing facility    Frequency and Duration min 3x week  1 week       Prognosis Prognosis for Safe Diet Advancement: Guarded (at this time) Barriers to Reach Goals: Cognitive deficits;Behavior;Medication      Swallow Study   General Date of Onset: 05/15/2015 HPI: Vent dependent trach secondary to lung cancer, prolonged intubation. Pt now has a tracheostomy and  PEG was placed yesterday. MD gave ok for BSE sec. to pt wanting thin liquids - "something to drink". Pt continues to be confused w/ agitated behavior often requiring meds for agitation; Sitter present.  Type of Study: Bedside Swallow Evaluation Previous Swallow Assessment: none Diet Prior to this Study: Regular;Thin liquids (at home prior to admission to hospital) Temperature Spikes Noted: No (wbc 11.9  on 07/02/15) Respiratory Status: Trach Collar (10 liters; is beginning wean from vent - up to 8 hours today) History of Recent Intubation: Yes (prolonged from admission to 06/21/2015) Length of Intubations (days):  (prolonged) Date extubated:  (trach received on 06/04/2015) Behavior/Cognition: Confused;Agitated;Uncooperative;Distractible;Requires cueing (awake; agreed to participate in po trials) Oral Cavity Assessment:  (unable to assess) Oral Care Completed by SLP: Recent completion by staff Oral Cavity - Dentition: Missing dentition Self-Feeding Abilities: Total assist (mitts in place) Patient Positioning: Upright in bed Baseline Vocal Quality: Breathy;Low vocal intensity;Hoarse (PMV placed for this eval) Volitional Cough: Weak (Fair) Volitional Swallow: Able to elicit    Oral/Motor/Sensory Function Overall Oral Motor/Sensory Function: Within functional limits (grossly)   Ice Chips Ice chips: Within functional limits Presentation: Spoon (fed; 5 trials) Other Comments: pt often talked w/ ice chips in mouth; education given   Thin Liquid Thin Liquid: Impaired Presentation: Cup;Straw (assisted w/ feeding; 3 trials via each) Oral Phase Impairments:  (none) Oral Phase Functional Implications:  (none) Pharyngeal  Phase Impairments: Cough - Delayed (x1 at end of session) Other Comments: pt required cues to not talk during/immediately after po's given; to focus on the po trials    Nectar Thick Nectar Thick Liquid: Not tested   Honey Thick Honey Thick Liquid: Not tested   Puree Puree: Impaired Presentation: Spoon (fed; 2 trials) Oral Phase Impairments:  (none) Oral Phase Functional Implications:  (none) Pharyngeal Phase Impairments: Cough - Delayed (x1/1 trials)   Solid Solid: Not tested      Orinda Kenner, MS, CCC-SLP  Watson,Katherine 07/04/2015,12:02 PM

## 2015-07-04 NOTE — Consult Note (Signed)
Electrolyte CONSULT NOTE - FOLLOW UP  Pharmacy Consult for Electrolyte monitoring and replacement  Patient Measurements: Height: '5\' 7"'$  (170.2 cm) Weight: 195 lb 9.6 oz (88.724 kg) IBW/kg (Calculated) : 61.6  Vital Signs: Temp: 97.9 F (36.6 C) (12/09 1100) Temp Source: Axillary (12/09 1100) BP: 136/60 mmHg (12/09 1300) Pulse Rate: 59 (12/09 1300)  Recent Labs  07/02/15 1139 06/28/2015 0512 07/09/2015 2021  NA 152* 155* 155*  K 4.0 4.0 3.8  CL 116* 121* 120*  CO2 '29 28 28  '$ GLUCOSE 114* 109* 106*  BUN 72* 62* 57*  CREATININE 0.81 0.86 0.81  CALCIUM 9.2 8.9 8.6*  MG  --  1.7  --   PHOS  --  4.4  --    Estimated Creatinine Clearance: 77 mL/min (by C-G formula based on Cr of 0.81).   Recent Labs  06/26/2015 2015 07/04/15 0019 07/04/15 0750  GLUCAP 98 97 92    Assessment: 69 yo female with SCLC current;y intubated in ICU. Pharmacy consulted for monitoring and managing electrolytes.   Plan:  Next labs 12/10.   Ulice Dash, PharmD  Clinical Pharmacist  07/04/2015

## 2015-07-05 ENCOUNTER — Encounter: Payer: Self-pay | Admitting: Gastroenterology

## 2015-07-05 LAB — PHOSPHORUS: PHOSPHORUS: 3.2 mg/dL (ref 2.5–4.6)

## 2015-07-05 LAB — BASIC METABOLIC PANEL
ANION GAP: 6 (ref 5–15)
BUN: 51 mg/dL — ABNORMAL HIGH (ref 6–20)
CHLORIDE: 121 mmol/L — AB (ref 101–111)
CO2: 26 mmol/L (ref 22–32)
Calcium: 8.5 mg/dL — ABNORMAL LOW (ref 8.9–10.3)
Creatinine, Ser: 0.7 mg/dL (ref 0.44–1.00)
Glucose, Bld: 134 mg/dL — ABNORMAL HIGH (ref 65–99)
POTASSIUM: 3.1 mmol/L — AB (ref 3.5–5.1)
SODIUM: 153 mmol/L — AB (ref 135–145)

## 2015-07-05 LAB — GLUCOSE, CAPILLARY
Glucose-Capillary: 105 mg/dL — ABNORMAL HIGH (ref 65–99)
Glucose-Capillary: 115 mg/dL — ABNORMAL HIGH (ref 65–99)
Glucose-Capillary: 129 mg/dL — ABNORMAL HIGH (ref 65–99)
Glucose-Capillary: 141 mg/dL — ABNORMAL HIGH (ref 65–99)
Glucose-Capillary: 172 mg/dL — ABNORMAL HIGH (ref 65–99)
Glucose-Capillary: 81 mg/dL (ref 65–99)

## 2015-07-05 LAB — MAGNESIUM: MAGNESIUM: 1.3 mg/dL — AB (ref 1.7–2.4)

## 2015-07-05 MED ORDER — ZIPRASIDONE MESYLATE 20 MG IM SOLR
20.0000 mg | Freq: Three times a day (TID) | INTRAMUSCULAR | Status: DC | PRN
  Administered 2015-07-08 – 2015-07-13 (×8): 20 mg via INTRAMUSCULAR
  Filled 2015-07-05 (×9): qty 20

## 2015-07-05 MED ORDER — ZIPRASIDONE MESYLATE 20 MG IM SOLR
10.0000 mg | Freq: Once | INTRAMUSCULAR | Status: AC
Start: 2015-07-05 — End: 2015-07-05
  Administered 2015-07-05: 10 mg via INTRAMUSCULAR

## 2015-07-05 MED ORDER — FENTANYL CITRATE (PF) 100 MCG/2ML IJ SOLN
50.0000 ug | Freq: Once | INTRAMUSCULAR | Status: AC
  Administered 2015-07-05: 50 ug via INTRAVENOUS

## 2015-07-05 MED ORDER — FENTANYL 2500MCG IN NS 250ML (10MCG/ML) PREMIX INFUSION
25.0000 ug/h | INTRAVENOUS | Status: DC
  Administered 2015-07-05: 50 ug/h via INTRAVENOUS
  Administered 2015-07-05: 275 ug/h via INTRAVENOUS
  Administered 2015-07-06: 125 ug/h via INTRAVENOUS
  Administered 2015-07-06: 100 ug/h via INTRAVENOUS
  Administered 2015-07-06: 200 ug/h via INTRAVENOUS
  Administered 2015-07-06: 150 ug/h via INTRAVENOUS
  Administered 2015-07-06: 125 ug/h via INTRAVENOUS
  Administered 2015-07-06: 250 ug/h via INTRAVENOUS
  Administered 2015-07-06: 50 ug/h via INTRAVENOUS
  Administered 2015-07-06: 125 ug/h via INTRAVENOUS
  Administered 2015-07-07: 150 ug/h via INTRAVENOUS
  Administered 2015-07-07: 100 ug/h via INTRAVENOUS
  Administered 2015-07-07: 150 ug/h via INTRAVENOUS
  Administered 2015-07-08: 50 ug/h via INTRAVENOUS
  Administered 2015-07-08 (×2): 150 ug/h via INTRAVENOUS
  Administered 2015-07-08: 175 ug/h via INTRAVENOUS
  Administered 2015-07-09: 100 ug/h via INTRAVENOUS
  Administered 2015-07-12 – 2015-07-13 (×2): 175 ug/h via INTRAVENOUS
  Administered 2015-07-13: 125 ug/h via INTRAVENOUS
  Administered 2015-07-14 (×2): 200 ug/h via INTRAVENOUS
  Administered 2015-07-15 (×2): 100 ug/h via INTRAVENOUS
  Administered 2015-07-15: 250 ug/h via INTRAVENOUS
  Filled 2015-07-05 (×14): qty 250

## 2015-07-05 MED ORDER — FENTANYL BOLUS VIA INFUSION
25.0000 ug | INTRAVENOUS | Status: DC | PRN
  Filled 2015-07-05: qty 25

## 2015-07-05 MED ORDER — ZIPRASIDONE MESYLATE 20 MG IM SOLR
10.0000 mg | Freq: Three times a day (TID) | INTRAMUSCULAR | Status: DC | PRN
  Administered 2015-07-05: 10 mg via INTRAMUSCULAR
  Filled 2015-07-05 (×3): qty 20

## 2015-07-05 MED ORDER — RISPERIDONE 1 MG/ML PO SOLN
1.0000 mg | Freq: Every day | ORAL | Status: DC
  Administered 2015-07-05 – 2015-07-07 (×3): 1 mg via ORAL
  Filled 2015-07-05 (×5): qty 1

## 2015-07-05 MED ORDER — POTASSIUM CHLORIDE 10 MEQ/100ML IV SOLN
10.0000 meq | INTRAVENOUS | Status: AC
  Administered 2015-07-05 (×6): 10 meq via INTRAVENOUS
  Filled 2015-07-05 (×6): qty 100

## 2015-07-05 MED ORDER — MAGNESIUM SULFATE 4 GM/100ML IV SOLN
4.0000 g | Freq: Once | INTRAVENOUS | Status: AC
  Administered 2015-07-05: 4 g via INTRAVENOUS
  Filled 2015-07-05: qty 100

## 2015-07-05 NOTE — Progress Notes (Signed)
* [facility]  69 year old female with new diagnosis of small cell lung cancer, subsequently intubated due to right lung atelectasis and respiratory failure, because of the lung cancer. She is now status post 2 cycles of chemotherapy to see if this tumor can be shrunk.   Assessment and Plan:  Prolonged VDRF -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, chemotherapy; Significant improvement in airway patency by bronchoscopy 11/22. -Continued ventilator dependence, status post tracheostomy. Started TCT 12/7 - tolerated 8 hrs yesterday, goal today's 12-16 hours of trach collar trials and then resting and on the vent   Right lung, small cell lung cancer. -The patient did have some response with opening of the right mainstem airway. With the first course of chemotherapy. Her course has been, complicated by severe debility, deconditioning, pancytopenia, severe mucus plugging and overall continued general decline in her status. -After this course of chemotherapy. We will need to reevaluate the patient and see if she may be a candidate to go to long-term acute care facility, and potentially receive more chemotherapy or to change the patient to comfort measures only. -Hematology/oncology currently following, has now completed cycle 2 of cisplatin and etoposide -in addition given dx of small cell lung ca, will need evaluation for radiation treatment -Patient would benefit from radiation therapy, but there is concern for possible patient's safety transportation issues to radiation oncology. -s/p PEG 06/28/2015 -rad/onc willing to do palliative radiation once completely liberated from the vent.   COPD  -Cont nebulized steroids -Cont nebulized bronchodilators.  PSVT  -Cont scheduled enteral metoprolol  Anemia/leukopenia/thrombocytopenia - pancytopenia - patient starting her nadir from chemo - monitor counts - s/p transfuse 1 unit of prbc 12/6 - heme/onc started  neulasta  Agitated, delirium. Severe debility and deconditioning - now improving  Hypernatremia - s/p PEG placement, will start Free H2O flushes (250cc q6hrs).   Volume overload Large TF residuals - resolved  Sinusitis -Completed 5 day course.   ?Paraneoplastic Syndrome - slowly improving -Has severe weakness - again improving, significantly  Thrush -Completing a 3 day course of diflucan.     Monitor BMET intermittently,PT/OT Monitor I/Os DVT px: SCDs Monitor CBC intermittently Transfuse per usual guidelines   Date: 07/05/2015  MRN# [id number] Emily Pratt 03/09/1946   Emily Pratt is a 69 y.o. old female seen in follow up for chief complaint of  Chief Complaint  Patient presents with  . Respiratory Distress     HPI/ROS: Currently on PSV, TCT today, acute complaints  Allergies:  Atorvastatin and Rosuvastatin    Physical Examination:   VS: BP 119/59 mmHg  Pulse 59  Temp(Src) 96.7 F (35.9 C) (Axillary)  Resp 19  Ht '5\' 7"'$  (1.702 m)  Wt 88.95 kg (196 lb 1.6 oz)  BMI 30.71 kg/m2  SpO2 97%  General Appearance: No distress  Neuro:without focal findings, strength improving bilaterally (4/5). HEENT: PERRLA, EOM intact. Pulmonary: coarse upper airway sounds, upper airway congestion,  CardiovascularNormal S1,S2.  No m/r/g.   Abdomen: Benign, Soft, mild tender, LUQ PEG in place.  Renal:  No costovertebral tenderness  GU:  Not performed at this time. Endoc: No evident thyromegaly, no signs of acromegaly. Skin:   warm, no rash. Extremities: normal, no cyanosis, clubbing.   LABORATORY PANEL:   CBC  Recent Labs Lab 07/02/15 1139  WBC 11.9*  HGB 8.2*  HCT 25.9*  PLT 116*   ------------------------------------------------------------------------------------------------------------------  Chemistries   Recent Labs Lab 06/29/15 0702  07/05/15 0454  NA  146*  < > 153*  K 4.2  < > 3.1*  CL 109  < > 121*  CO2 28  < > 26  GLUCOSE 125*  < >  134*  BUN 76*  < > 51*  CREATININE 0.80  < > 0.70  CALCIUM 8.5*  < > 8.5*  MG  --   < > 1.3*  AST 60*  --   --   ALT 58*  --   --   ALKPHOS 140*  --   --   BILITOT 0.9  --   --   < > = values in this interval not displayed. ------------------------------------------------------------------------------------------------------------------  Cardiac Enzymes No results for input(s): TROPONINI in the last 168 hours. ------------------------------------------------------------  RADIOLOGY:   No results found for this or any previous visit. No results found for this or any previous visit. ------------------------------------------------------------------------------------------------------------------  Thank  you for allowing [facility] to assist in the care of your patient. Our recommendations are noted above.  Please contact us if we can be of further service.  Marda Stalker, M.D. [place] [facility] On Call Pager [phone] (please enter 7-digits)   [place].  I have personally obtained a history, examined the patient, evaluated laboratory and imaging results, formulated the assessment and plan and placed orders. The case was discussed in detail with the critical care RN, critical care pharmacist, nutrition, case manager, unit manager, and respiratory therapist. The Patient requires high complexity decision making for assessment and support, frequent evaluation and titration of therapies, application of advanced monitoring technologies and extensive interpretation of multiple databases. The patient has critical illness that could lead imminently to failure of 1 or more organ systems and requires the highest level of physician preparedness to intervene.  Critical Care Time devoted to patient care services described in this note is 35 minutes and is exclusive of time spent in procedures.

## 2015-07-05 NOTE — Progress Notes (Signed)
Patient extremely agitated this am, not allowing anyone to work with her regarding ATC trial, patient angry, swatting at staff.  Will attempt ATC trial later today.

## 2015-07-05 NOTE — Consult Note (Signed)
Electrolyte CONSULT NOTE - FOLLOW UP  Pharmacy Consult for Electrolyte monitoring and replacement  Patient Measurements: Height: '5\' 7"'$  (170.2 cm) Weight: 196 lb 1.6 oz (88.95 kg) IBW/kg (Calculated) : 61.6  Vital Signs: Temp: 97.7 F (36.5 C) (12/10 0400) Temp Source: Axillary (12/10 0400) BP: 117/56 mmHg (12/10 0500) Pulse Rate: 67 (12/10 0500)  Recent Labs  07/21/2015 0512 06/29/2015 2021 07/05/15 0454  NA 155* 155* 153*  K 4.0 3.8 3.1*  CL 121* 120* 121*  CO2 '28 28 26  '$ GLUCOSE 109* 106* 134*  BUN 62* 57* 51*  CREATININE 0.86 0.81 0.70  CALCIUM 8.9 8.6* 8.5*  MG 1.7  --  1.3*  PHOS 4.4  --  3.2   Estimated Creatinine Clearance: 78.2 mL/min (by C-G formula based on Cr of 0.7).   Recent Labs  07/04/15 2010 07/05/15 0020 07/05/15 0425  GLUCAP 127* 1 105*    Assessment: 69 yo female with SCLC current;y intubated in ICU. Pharmacy consulted for monitoring and managing electrolytes.   Plan:  K 3.1, Mg 1.3. Magnesium 4 gm IV x 1 followed by potassium chloride 10 mEq IV Q1H x 6. Will recheck with AM labs.  Kimaya Whitlatch A. [place], [place].D., BCPS Clinical Pharmacist  07/05/2015

## 2015-07-05 NOTE — Progress Notes (Signed)
Patient still extremely agitated, will not attempt ATC trial.

## 2015-07-05 NOTE — Progress Notes (Signed)
Nutrition Follow-up    INTERVENTION:   EN: recommend continuing to titrate TF as tolerated to goal rate as per order set; free water flushes as per MD order (350 mL q 6 hours). Total free water with TF goal rate is 3.3 Liters.  If sodium does not continue to trend down, recommend addition of IV hydration until sodium wdl   NUTRITION DIAGNOSIS:   Inadequate oral intake related to acute illness as evidenced by NPO status.  GOAL:   Patient will meet greater than or equal to 90% of their needs  MONITOR:    (Energy Intake, Anthropometrics, Electrolyte/Renal Profile, Digestive System, Electrolyte/Renal Profile)  REASON FOR ASSESSMENT:   Consult Enteral/tube feeding initiation and management  ASSESSMENT:   Pt remains on vent via trach, on precedex, potassium and magnesium being supplemented. Sodium trending down   Diet Order:  Diet NPO time specified   EN: tolerating Vital High Protein at rate of 40 ml/hr, plan to increase to 50 ml/hr this AM  Skin:  Stage III pressure ulcer on sacrum  Digestive System: no signs of TF intolerance, flexiseal in place, no stool on night shift, no stool thus far this AM  Electrolyte and Renal Profile:  Recent Labs Lab 07/01/15 0423  07/07/2015 0512 06/30/2015 2021 07/05/15 0454  BUN 76*  < > 62* 57* 51*  CREATININE 0.83  < > 0.86 0.81 0.70  NA 144  < > 155* 155* 153*  K 3.8  < > 4.0 3.8 3.1*  MG 1.8  --  1.7  --  1.3*  PHOS 4.9*  --  4.4  --  3.2  < > = values in this interval not displayed. Glucose Profile:  Recent Labs  07/04/15 2010 07/05/15 0020 07/05/15 0425  GLUCAP 127* 81 105*   Meds: reviewed  Urine Volume: UOP 1250 mL in 24 hours per documentation  Height:   Ht Readings from Last 1 Encounters:  05/16/2015 '5\' 7"'$  (1.702 m)    Weight:   Wt Readings from Last 1 Encounters:  07/05/15 196 lb 1.6 oz (88.95 kg)    BMI:  Body mass index is 30.71 kg/(m^2).  Estimated Nutritional Needs:   Kcal:  1254-1596kcals,  (11-14kcals/kg) using current weight of 114kg)  Protein:  122-153 g (2.0-2.5 g/kg IBW) or 140-176 g (1.2-1.5 g/kg current wt)  Fluid:  1535-1838m of fluid (25-328mkg)  EDUCATION NEEDS:   No education needs identified at this time  HIYonahRD, LDN ([phone]

## 2015-07-06 ENCOUNTER — Inpatient Hospital Stay: Payer: Medicare Other

## 2015-07-06 LAB — GLUCOSE, CAPILLARY
Glucose-Capillary: 155 mg/dL — ABNORMAL HIGH (ref 65–99)
Glucose-Capillary: 148 mg/dL — ABNORMAL HIGH (ref 65–99)
Glucose-Capillary: 141 mg/dL — ABNORMAL HIGH (ref 65–99)
Glucose-Capillary: 127 mg/dL — ABNORMAL HIGH (ref 65–99)
Glucose-Capillary: 117 mg/dL — ABNORMAL HIGH (ref 65–99)

## 2015-07-06 LAB — BLOOD GAS, ARTERIAL
ACID-BASE DEFICIT: 1.7 mmol/L (ref 0.0–2.0)
Allens test (pass/fail): POSITIVE — AB
BICARBONATE: 26.8 meq/L (ref 21.0–28.0)
FIO2: 0.35
O2 SAT: 94.8 %
PATIENT TEMPERATURE: 37
pCO2 arterial: 61 mmHg — ABNORMAL HIGH (ref 32.0–48.0)
pH, Arterial: 7.25 — ABNORMAL LOW (ref 7.350–7.450)
pO2, Arterial: 86 mmHg (ref 83.0–108.0)

## 2015-07-06 LAB — CBC
HCT: 21.4 % — ABNORMAL LOW (ref 35.0–47.0)
HEMOGLOBIN: 7 g/dL — AB (ref 12.0–16.0)
MCH: 29.8 pg (ref 26.0–34.0)
MCHC: 32.6 g/dL (ref 32.0–36.0)
MCV: 91.4 fL (ref 80.0–100.0)
Platelets: 46 10*3/uL — ABNORMAL LOW (ref 150–440)
RBC: 2.34 MIL/uL — AB (ref 3.80–5.20)
RDW: 17.9 % — ABNORMAL HIGH (ref 11.5–14.5)
WBC: 1.1 10*3/uL — CL (ref 3.6–11.0)

## 2015-07-06 LAB — BASIC METABOLIC PANEL
Anion gap: 9 (ref 5–15)
BUN: 52 mg/dL — AB (ref 6–20)
CHLORIDE: 114 mmol/L — AB (ref 101–111)
CO2: 26 mmol/L (ref 22–32)
Calcium: 8.8 mg/dL — ABNORMAL LOW (ref 8.9–10.3)
Creatinine, Ser: 0.84 mg/dL (ref 0.44–1.00)
GFR calc Af Amer: >60 mL/min (ref >60)
GFR calc non Af Amer: >60 mL/min (ref >60)
GLUCOSE: 186 mg/dL — AB (ref 65–99)
POTASSIUM: 3.7 mmol/L (ref 3.5–5.1)
SODIUM: 149 mmol/L — AB (ref 135–145)

## 2015-07-06 LAB — MAGNESIUM: MAGNESIUM: 1.8 mg/dL (ref 1.7–2.4)

## 2015-07-06 LAB — PHOSPHORUS: Phosphorus: 2.3 mg/dL — ABNORMAL LOW (ref 2.5–4.6)

## 2015-07-06 MED ORDER — SODIUM CHLORIDE 0.9 % IV BOLUS (SEPSIS)
500.0000 mL | Freq: Once | INTRAVENOUS | Status: AC
  Administered 2015-07-06: 500 mL via INTRAVENOUS

## 2015-07-06 MED ORDER — VITAL HIGH PROTEIN PO LIQD
1000.0000 mL | ORAL | Status: DC
  Administered 2015-07-06 – 2015-07-14 (×8): 1000 mL

## 2015-07-06 MED ORDER — NOREPINEPHRINE BITARTRATE 1 MG/ML IV SOLN
0.0000 ug/min | INTRAVENOUS | Status: DC
  Filled 2015-07-06: qty 4

## 2015-07-06 MED ORDER — POTASSIUM PHOSPHATES 15 MMOLE/5ML IV SOLN
15.0000 mmol | Freq: Once | INTRAVENOUS | Status: AC
  Administered 2015-07-06: 15 mmol via INTRAVENOUS
  Filled 2015-07-06: qty 5

## 2015-07-06 MED ORDER — MAGNESIUM SULFATE 2 GM/50ML IV SOLN
2.0000 g | Freq: Once | INTRAVENOUS | Status: AC
  Administered 2015-07-06: 2 g via INTRAVENOUS
  Filled 2015-07-06: qty 50

## 2015-07-06 NOTE — Consult Note (Signed)
Electrolyte CONSULT NOTE - FOLLOW UP  Pharmacy Consult for Electrolyte monitoring and replacement  Patient Measurements: Height: '5\' 7"'$  (170.2 cm) Weight: 197 lb 15.6 oz (89.8 kg) IBW/kg (Calculated) : 61.6  Vital Signs: Temp: 98.2 F (36.8 C) (12/11 0400) Temp Source: Axillary (12/11 0400) BP: 119/70 mmHg (12/11 0600) Pulse Rate: 138 (12/11 0600)  Recent Labs  07/02/2015 2021 07/05/15 0454 07/06/15 0440  NA 155* 153* 149*  K 3.8 3.1* 3.7  CL 120* 121* 114*  CO2 '28 26 26  '$ GLUCOSE 106* 134* 186*  BUN 57* 51* 52*  CREATININE 0.81 0.70 0.84  CALCIUM 8.6* 8.5* 8.8*  MG  --  1.3* 1.8  PHOS  --  3.2 2.3*   Estimated Creatinine Clearance: 74.8 mL/min (by C-G formula based on Cr of 0.84).   Recent Labs  07/05/15 1930 07/05/15 2333 07/06/15 0400  GLUCAP 172* 141* 155*    Assessment: 69 yo female with SCLC current;y intubated in ICU. Pharmacy consulted for monitoring and managing electrolytes.   Plan:  K 3.7, Phos 2.3, Mg 1.8. Ordered potassium phosphate 15 mmol IV x 1 and magnesium 2 gm IV x 1. Will recheck with AM labs.   Pauletta Pickney A. [place], [place].D., BCPS Clinical Pharmacist  07/06/2015

## 2015-07-06 NOTE — Progress Notes (Signed)
Patients breathing rate and Minute ventilation not substantial to keep SpO2 >92% RN stopped sedation, placed patient back on PRVC for the time being. SpO2 99% now with 30%FiO2

## 2015-07-06 NOTE — Progress Notes (Signed)
* [facility]  69 year old female with new diagnosis of small cell lung cancer, subsequently intubated due to right lung atelectasis and respiratory failure, because of the lung cancer. She is now status post 2 cycles of chemotherapy to see if this tumor can be shrunk.   Assessment and Plan:  Prolonged VDRF -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, chemotherapy; Significant improvement in airway patency by bronchoscopy 11/22. -Continue vent weaning, this and has been weaned to 35% trach collar.   Right lung, small cell lung cancer. -Status post 2 cycles of chemotherapy  -s/p PEG 07/05/2015 -rad/onc willing to do palliative radiation once completely liberated from the vent.   COPD  -Cont nebulized steroids, does not appear to be in exacerbation at this time  -Cont nebulized bronchodilators.  PSVT  -Cont scheduled enteral metoprolol  Anemia/leukopenia/thrombocytopenia - pancytopenia -We'll continue to monitor CBC, transfuse as needed.  Agitated, delirium. Severe debility and deconditioning - now improving -Likely some degree of sundowning, continue benzodiazepines, including Xanax, Klonopin, and when necessary Versed. Continue IV fentanyl. -IV Precedex was discontinued as she had likely developed some degree of tachyphylaxis.   Hypernatremia -Improved after stopping Lasix, and continuing with free water replacement. We'll continue to monitor. - s/p PEG placement, continue  Free H2O flushes.  Volume overload - resolved, will monitor. The patient is now off Lasix.    ?Paraneoplastic Syndrome - slowly improving -Has severe weakness - again improving, significantly   . The patient has been weaned to trach collar today, will continue to wean as tolerated, the patient now appears to be stable for transfer to [place].   Monitor BMET intermittently,PT/OT Monitor I/Os DVT px: SCDs Monitor CBC intermittently Transfuse per usual guidelines   Date: 07/06/2015   MRN# [id number] Emily Pratt 1945-09-13   Emily Pratt is a 69 y.o. old female seen in follow up for chief complaint of  Chief Complaint  Patient presents with  . Respiratory Distress     HPI/ROS: Currently on PSV, TCT today, acute complaints  Allergies:  Atorvastatin and Rosuvastatin    Physical Examination:   VS: BP 115/61 mmHg  Pulse 131  Temp(Src) 99.1 F (37.3 C) (Axillary)  Resp 19  Ht '5\' 7"'$  (1.702 m)  Wt 89.8 kg (197 lb 15.6 oz)  BMI 31.00 kg/m2  SpO2 96%  General Appearance: No distress  Neuro:without focal findings, strength improving bilaterally (4/5). HEENT: PERRLA, EOM intact. Pulmonary: coarse upper airway sounds, upper airway congestion,  CardiovascularNormal S1,S2.  No m/r/g.   Abdomen: Benign, Soft, mild tender, LUQ PEG in place.  Renal:  No costovertebral tenderness  GU:  Not performed at this time. Endoc: No evident thyromegaly, no signs of acromegaly. Skin:   warm, no rash. Extremities: normal, no cyanosis, clubbing.   LABORATORY PANEL:   CBC  Recent Labs Lab 07/06/15 0440  WBC 1.1*  HGB 7.0*  HCT 21.4*  PLT 46*   ------------------------------------------------------------------------------------------------------------------  Chemistries   Recent Labs Lab 07/06/15 0440  NA 149*  K 3.7  CL 114*  CO2 26  GLUCOSE 186*  BUN 52*  CREATININE 0.84  CALCIUM 8.8*  MG 1.8   ------------------------------------------------------------------------------------------------------------------  Cardiac Enzymes No results for input(s): TROPONINI in the last 168 hours. ------------------------------------------------------------  RADIOLOGY:   No results found for this or any previous visit. No results found for this or any previous visit. ------------------------------------------------------------------------------------------------------------------  Thank  you for allowing [facility] [place] [facility] to assist in  the care of  your patient. Our recommendations are noted above.  Please contact us if we can be of further service.  Marda Stalker, M.D. [place] [facility] On Call Pager [phone] (please enter 7-digits)   [place].  I have personally obtained a history, examined the patient, evaluated laboratory and imaging results, formulated the assessment and plan and placed orders. The case was discussed in detail with the critical care RN, critical care pharmacist, nutrition, case manager, unit manager, and respiratory therapist. The Patient requires high complexity decision making for assessment and support, frequent evaluation and titration of therapies, application of advanced monitoring technologies and extensive interpretation of multiple databases. The patient has critical illness that could lead imminently to failure of 1 or more organ systems and requires the highest level of physician preparedness to intervene.  Critical Care Time devoted to patient care services described in this note is 35 minutes and is exclusive of time spent in procedures.

## 2015-07-06 NOTE — Progress Notes (Signed)
Patient placed on neutropenic precautions for WBC 1.1.

## 2015-07-07 LAB — GLUCOSE, CAPILLARY
Glucose-Capillary: 104 mg/dL — ABNORMAL HIGH (ref 65–99)
Glucose-Capillary: 115 mg/dL — ABNORMAL HIGH (ref 65–99)
Glucose-Capillary: 117 mg/dL — ABNORMAL HIGH (ref 65–99)
Glucose-Capillary: 118 mg/dL — ABNORMAL HIGH (ref 65–99)
Glucose-Capillary: 121 mg/dL — ABNORMAL HIGH (ref 65–99)
Glucose-Capillary: 123 mg/dL — ABNORMAL HIGH (ref 65–99)
Glucose-Capillary: 132 mg/dL — ABNORMAL HIGH (ref 65–99)
Glucose-Capillary: 138 mg/dL — ABNORMAL HIGH (ref 65–99)
Glucose-Capillary: 144 mg/dL — ABNORMAL HIGH (ref 65–99)

## 2015-07-07 LAB — BLOOD GAS, ARTERIAL
ACID-BASE EXCESS: 2.2 mmol/L (ref 0.0–3.0)
Allens test (pass/fail): POSITIVE — AB
Bicarbonate: 28.2 mEq/L — ABNORMAL HIGH (ref 21.0–28.0)
FIO2: 30
O2 Saturation: 87.1 %
PCO2 ART: 51 mmHg — AB (ref 32.0–48.0)
PEEP/CPAP: 5 cmH2O
PH ART: 7.35 (ref 7.350–7.450)
Patient temperature: 37
Pressure support: 15 cmH2O
pO2, Arterial: 56 mmHg — ABNORMAL LOW (ref 83.0–108.0)

## 2015-07-07 LAB — BASIC METABOLIC PANEL
Anion gap: 5 (ref 5–15)
BUN: 58 mg/dL — AB (ref 6–20)
CHLORIDE: 113 mmol/L — AB (ref 101–111)
CO2: 27 mmol/L (ref 22–32)
Calcium: 8.5 mg/dL — ABNORMAL LOW (ref 8.9–10.3)
Creatinine, Ser: 1.02 mg/dL — ABNORMAL HIGH (ref 0.44–1.00)
GFR calc Af Amer: >60 mL/min (ref >60)
GFR calc non Af Amer: 56 mL/min — ABNORMAL LOW (ref >60)
Glucose, Bld: 133 mg/dL — ABNORMAL HIGH (ref 65–99)
POTASSIUM: 3.8 mmol/L (ref 3.5–5.1)
SODIUM: 145 mmol/L (ref 135–145)

## 2015-07-07 LAB — PREPARE RBC (CROSSMATCH)

## 2015-07-07 LAB — MAGNESIUM: MAGNESIUM: 2 mg/dL (ref 1.7–2.4)

## 2015-07-07 LAB — CBC
HCT: 17.2 % — ABNORMAL LOW (ref 35.0–47.0)
Hemoglobin: 5.6 g/dL — ABNORMAL LOW (ref 12.0–16.0)
MCH: 29.9 pg (ref 26.0–34.0)
MCHC: 32.5 g/dL (ref 32.0–36.0)
MCV: 92 fL (ref 80.0–100.0)
PLATELETS: 37 10*3/uL — AB (ref 150–440)
RBC: 1.86 MIL/uL — AB (ref 3.80–5.20)
RDW: 18.3 % — AB (ref 11.5–14.5)
WBC: 2 10*3/uL — ABNORMAL LOW (ref 3.6–11.0)

## 2015-07-07 LAB — PHOSPHORUS: PHOSPHORUS: 3.5 mg/dL (ref 2.5–4.6)

## 2015-07-07 MED ORDER — ACETAMINOPHEN 325 MG PO TABS
650.0000 mg | ORAL_TABLET | Freq: Once | ORAL | Status: AC
  Administered 2015-07-07: 650 mg via ORAL
  Filled 2015-07-07: qty 2

## 2015-07-07 MED ORDER — SENNOSIDES-DOCUSATE SODIUM 8.6-50 MG PO TABS: 1.0000 | ORAL_TABLET | Freq: Two times a day (BID) | ORAL | Status: DC | PRN

## 2015-07-07 MED ORDER — SODIUM CHLORIDE 0.9 % IV SOLN
Freq: Once | INTRAVENOUS | Status: AC
  Administered 2015-07-07: 11:00:00 via INTRAVENOUS

## 2015-07-07 NOTE — Care Management (Signed)
Patient remains on ventilator support.  Her PCO2 level was elevated yesterday.  Attempting spontaneous breathing trials today

## 2015-07-07 NOTE — Consult Note (Signed)
Electrolyte CONSULT NOTE - FOLLOW UP  Pharmacy Consult for Electrolyte monitoring and replacement  Patient Measurements: Height: '5\' 7"'$  (170.2 cm) Weight: 204 lb 9.4 oz (92.8 kg) IBW/kg (Calculated) : 61.6  Vital Signs: Temp: 98.7 F (37.1 C) (12/12 0000) Temp Source: Axillary (12/12 0000) BP: 95/44 mmHg (12/12 0602) Pulse Rate: 83 (12/12 0500)  Recent Labs  07/05/15 0454 07/06/15 0440 07/07/15 0543  NA 153* 149* 145  K 3.1* 3.7 3.8  CL 121* 114* 113*  CO2 '26 26 27  '$ GLUCOSE 134* 186* 133*  BUN 51* 52* 58*  CREATININE 0.70 0.84 1.02*  CALCIUM 8.5* 8.8* 8.5*  MG 1.3* 1.8 2.0  PHOS 3.2 2.3* 3.5   Estimated Creatinine Clearance: 62.6 mL/min (by C-G formula based on Cr of 1.02).   Recent Labs  07/06/15 1938 07/07/15 0014 07/07/15 0407  GLUCAP 117* 138* 115*    Assessment: 69 yo female with SCLC current;y intubated in ICU. Pharmacy consulted for monitoring and managing electrolytes.   Plan:  Electrolytes WNL. Will recheck with AM labs.  Danelly Hassinger A. [place], [place].D., BCPS Clinical Pharmacist  07/07/2015

## 2015-07-07 NOTE — Progress Notes (Signed)
Zero gastic residual.

## 2015-07-07 NOTE — Progress Notes (Signed)
[place]  [phone]) [phone] [phone]  ID: Emily Pratt OB: Oct 05, 1945  MR#: [id number]  INO#:[id number]  Patient Care Team: Dagoberto Ligas, MD as PCP - General (Internal Medicine)  CHIEF COMPLAINT:  Chief Complaint  Patient presents with  . Respiratory Distress    INTERVAL HISTORY: Patient now has tracheostomy, now off vent on trach collar. Fentanyl drip recently restarted. Patient currently sedated. Sitter at bedside. No family at bedside. Patient completed cycle 2 of her chemotherapy on June 26, 2015.  REVIEW OF SYSTEMS:   Review of Systems  Unable to perform ROS: Sedated    PAST MEDICAL HISTORY: Past Medical History  Diagnosis Date  . Anxiety   . Arthritis   . COPD (chronic obstructive pulmonary disease) ([place])   . CHF (congestive heart failure) ([place])   . Hyperlipidemia   . Hypertension   . Diabetes mellitus without complication ([place])   . Chronic kidney disease   . Neuromuscular disorder ([place])     PAST SURGICAL HISTORY: Past Surgical History  Procedure Laterality Date  . Joint replacement Bilateral 2006    both knees  . Ankle surgery    . Tracheostomy tube placement N/A 05/28/2015    Procedure: TRACHEOSTOMY;  Surgeon: Margaretha Sheffield, MD;  Location: [facility] ORS;  Service: ENT;  Laterality: N/A;  . Peg placement N/A 07/18/2015    Procedure: PERCUTANEOUS ENDOSCOPIC GASTROSTOMY (PEG) PLACEMENT;  Surgeon: Josefine Class, MD;  Location: [facility] ENDOSCOPY;  Service: Endoscopy;  Laterality: N/A;    FAMILY HISTORY Family History  Problem Relation Age of Onset  . Diabetes Mother   . Cancer Father   . Diabetes Sister   . Stroke Sister        ADVANCED DIRECTIVES:    HEALTH MAINTENANCE: Social History  Substance Use Topics  . Smoking status: Current Every Day Smoker    Types: Cigarettes  . Smokeless tobacco: None     Comment: 0.5 pack /day  . Alcohol Use: No     Colonoscopy:  PAP:  Bone density:  Lipid  panel:  Allergies  Allergen Reactions  . Atorvastatin Other (See Comments)    Does not remember why she had Intolerance to Lipitor.  . Rosuvastatin Anxiety    Chest tigtness and feeling as if she had heartburn.    Current Facility-Administered Medications  Medication Dose Route Frequency Provider Last Rate Last Dose  . acetaminophen (TYLENOL) tablet 650 mg  650 mg Oral Q6H PRN Harrie Foreman, MD   650 mg at 06/24/15 0962   Or  . acetaminophen (TYLENOL) suppository 650 mg  650 mg Rectal Q6H PRN Harrie Foreman, MD      . antiseptic oral rinse solution (CORINZ)  7 mL Mouth Rinse QID Laverle Hobby, MD   7 mL at 07/07/15 1615  . budesonide (PULMICORT) nebulizer solution 0.5 mg  0.5 mg Nebulization BID Flora Lipps, MD   0.5 mg at 07/07/15 1959  . chlorhexidine gluconate (PERIDEX) 0.12 % solution 15 mL  15 mL Mouth Rinse BID Flora Lipps, MD   15 mL at 07/07/15 2011  . clonazePAM (KLONOPIN) tablet 0.5 mg  0.5 mg Per Tube BID Vilinda Boehringer, MD   0.5 mg at 07/06/15 2140  . collagenase (SANTYL) ointment   Topical Daily Wilhelmina Mcardle, MD      . famotidine (PEPCID) tablet 20 mg  20 mg Per Tube BID Wilhelmina Mcardle, MD   20 mg at 07/07/15 0941  . feeding supplement (PRO-STAT SUGAR FREE 64)  liquid 30 mL  30 mL Per Tube BID Laverle Hobby, MD   30 mL at 07/07/15 1614  . feeding supplement (VITAL HIGH PROTEIN) liquid 1,000 mL  1,000 mL Per Tube Continuous Laverle Hobby, MD 60 mL/hr at 07/06/15 0912 1,000 mL at 07/06/15 0912  . fentaNYL (SUBLIMAZE) injection 25-100 mcg  25-100 mcg Intravenous Q2H PRN Wilhelmina Mcardle, MD   100 mcg at 07/05/15 [phone]  . fentaNYL 2544mg in NS 2577m(1062mml) infusion-PREMIX  25-400 mcg/hr Intravenous Continuous PraLaverle HobbyD 15 mL/hr at 07/07/15 2131 150 mcg/hr at 07/07/15 2131  . filgrastim (NEUPOGEN) injection 300 mcg  300 mcg Subcutaneous Daily TimLloyd HugerD   300 mcg at 07/07/15 [phone] free water 350 mL  350 mL Per Tube 4  times per day VisVilinda BoehringerD   350 mL at 07/07/15 1729  . heparin lock flush 100 unit/mL  500 Units Intracatheter Once PRN TimLloyd HugerD      . heparin lock flush 100 unit/mL  500 Units Intracatheter Once PRN TimLloyd HugerD      . heparin lock flush 100 unit/mL  250 Units Intracatheter Once PRN TimLloyd HugerD      . insulin aspart (novoLOG) injection 0-20 Units  0-20 Units Subcutaneous 6 times per day DavWilhelmina McardleD   3 Units at 07/07/15 2010  . insulin glargine (LANTUS) injection 20 Units  20 Units Subcutaneous Daily DavWilhelmina McardleD   20 Units at 07/07/15 0940  . levalbuterol (XOPENEX) nebulizer solution 0.63 mg  0.63 mg Nebulization Q6H DavWilhelmina McardleD   0.63 mg at 07/07/15 1959  . losartan (COZAAR) tablet 50 mg  50 mg Oral Daily PraLaverle HobbyD   50 mg at 07/06/15 0929  . metoprolol (LOPRESSOR) tablet 100 mg  100 mg Per Tube BID DavWilhelmina McardleD   100 mg at 07/06/15 2140  . midazolam (VERSED) injection 1 mg  1 mg Intravenous Q2H PRN Vishal Mungal, MD   1 mg at 07/07/15 1630  . norepinephrine (LEVOPHED) 4 mg in dextrose 5 % 250 mL (0.016 mg/mL) infusion  0-40 mcg/min Intravenous Titrated PraLaverle HobbyD   Stopped at 07/06/15 1345  . polyethylene glycol (MIRALAX / GLYCOLAX) packet 17 g  17 g Oral Daily PRN PraLaverle HobbyD      . risperiDONE (RISPERDAL) 1 MG/ML oral solution 1 mg  1 mg Oral QHS PraLaverle HobbyD   1 mg at 07/06/15 2140  . senna-docusate (Senokot-S) tablet 1 tablet  1 tablet Oral BID PRN KurFlora LippsD      . sodium chloride 0.9 % injection 10 mL  10 mL Intracatheter PRN TimLloyd HugerD      . sodium chloride 0.9 % injection 10 mL  10 mL Intracatheter PRN TimLloyd HugerD      . sodium chloride 0.9 % injection 10 mL  10 mL Intracatheter PRN TimLloyd HugerD      . sodium chloride 0.9 % injection 3 mL  3 mL Intravenous PRN TimLloyd HugerD      . sodium chloride 0.9 % injection 3  mL  3 mL Intravenous PRN TimLloyd HugerD      . sodium chloride 0.9 % injection 3 mL  3 mL Intravenous PRN TimLloyd HugerD      . ziprasidone (GEODON) injection 20 mg  20 mg Intramuscular Q8H PRN PraLaverle Hobby  MD        OBJECTIVE: Filed Vitals:   07/07/15 1930 07/07/15 2000  BP: 102/55 110/48  Pulse:  90  Temp:  99.1 F (37.3 C)  Resp:  14     Body mass index is 32.04 kg/(m^2).    ECOG FS:4 - Bedbound  General:  Sedated. Eyes: Pink conjunctiva, anicteric sclera. HEENT:  Trach tube in place. Lungs: Clear to auscultation bilaterally. Heart: Regular rate and rhythm. No rubs, murmurs, or gallops. Abdomen: Soft, nontender, nondistended. No organomegaly noted, normoactive bowel sounds. Musculoskeletal: No edema, cyanosis, or clubbing. Neuro: Sedated. Skin: No rashes or petechiae noted.   LAB RESULTS:  Lab Results  Component Value Date   NA 145 07/07/2015   K 3.8 07/07/2015   CL 113* 07/07/2015   CO2 27 07/07/2015   GLUCOSE 133* 07/07/2015   BUN 58* 07/07/2015   CREATININE 1.02* 07/07/2015   CALCIUM 8.5* 07/07/2015   PROT 6.8 06/29/2015   ALBUMIN 2.9* 06/29/2015   AST 60* 06/29/2015   ALT 58* 06/29/2015   ALKPHOS 140* 06/29/2015   BILITOT 0.9 06/29/2015   GFRNONAA 56* 07/07/2015   GFRAA >60 07/07/2015    Lab Results  Component Value Date   WBC 2.0* 07/07/2015   NEUTROABS 8.0* 06/21/2015   HGB 5.6* 07/07/2015   HCT 17.2* 07/07/2015   MCV 92.0 07/07/2015   PLT 37* 07/07/2015     STUDIES: Dg Chest 1 View  07/06/2015  CLINICAL DATA:  Shortness of Breath EXAM: CHEST 1 VIEW COMPARISON:  June 29, 2015 FINDINGS: Tracheostomy catheter tip is 4.1 cm above the carina. Central catheter tip is in the superior vena cava. No pneumothorax. Nasogastric tube is no longer appreciable. There is a right pleural effusion with patchy opacity in the right mid and lower lung zones, less apparent than on most recent prior study. There has been partial clearing of  airspace opacity from the left base with only mild left base atelectasis remaining. Left lung otherwise is clear. Heart is mildly enlarged but stable. Pulmonary vascular is normal. No adenopathy. IMPRESSION: There has been a degree of clearing from each lung base. On the left, there is only slight atelectasis in the basilar region. Left lung now is otherwise clear. Left effusion no longer apparent. There is a right effusion which is layering. There is mild patchy airspace disease in the right mid and lower lung zones, less than on most recent prior study. No new opacity. No change in cardiac silhouette. Tube and catheter positions as described without pneumothorax. Electronically Signed   By: Lowella Grip III M.D.   On: 07/06/2015 08:23   Dg Chest 1 View  06/24/2015  CLINICAL DATA:  Acute respiratory failure, right pleural effusion, history of COPD, CHF, diabetes, chronic renal insufficiency, and current smoker. EXAM: CHEST 1 VIEW COMPARISON:  Portable chest x-ray of June 22, 2015 FINDINGS: The left lung is well-expanded. There is stable left lower lobe atelectasis with obscuration of the hemidiaphragm. On the right diffusely increased interstitial and alveolar opacities persist. The hemidiaphragm remains obscured. Cardiac silhouette is enlarged but its right border is indistinct. The pulmonary vascularity is mildly prominent though stable. A tracheostomy appliance tube has its tip at the level of the inferior margin of the clavicular heads which lies 6.4 cm above the carina. The esophagogastric tube tip projects below the inferior margin of the image. The right-sided PICC line tip projects over the junction of the proximal and midportions of the SVC. IMPRESSION: Slight interval deterioration in the  appearance of the left lower lobe. Persistent widespread interstitial and alveolar opacities on the right consistent with pneumonia or other alveolar filling process. There is no pneumothorax. Layering of  pleural fluid posteriorly on the right is suspected. Electronically Signed   By: David  Martinique M.D.   On: 06/24/2015 07:42   Dg Chest 1 View  05/27/2015  CLINICAL DATA:  Tracheostomy tube placement EXAM: CHEST 1 VIEW COMPARISON:  06/15/2015 FINDINGS: Tracheostomy tube as been placed. The tip is 7.6 cm from the carina. Large right pleural effusion and associated pulmonary opacity are stable. Opacity at the left base is stable. Borderline cardiomegaly. No pneumothorax. Right upper extremity PICC is stable. IMPRESSION: Tracheostomy tube as been placed as described Stable large right pleural effusion and bilateral pulmonary opacity. Electronically Signed   By: Marybelle Killings M.D.   On: 06/01/2015 19:18   Dg Chest 1 View  06/15/2015  CLINICAL DATA:  Dyspnea. History of COPD, congestive heart failure, hypertension, chronic kidney disease. EXAM: CHEST 1 VIEW COMPARISON:  06/12/2015 FINDINGS: Endotracheal tube, enteric catheter, right PICC line terminating in the expected location of proximal superior vena cava are stable. Cardiomediastinal silhouette is borderline enlarged. Mediastinal contours appear intact. The ureter demonstrates atherosclerotic calcifications of the arch. There is no evidence of pneumothorax. There is slight improvement of the aeration of the right lung with large right pleural effusion. Left lower lobe atelectasis is noted. Osseous structures are without acute abnormality. Soft tissues are grossly normal. IMPRESSION: Slight improvement of the aeration of the right lung, with persistent large right pleural effusion, an diffuse haziness of the visualized lung parenchyma. Atelectasis of the left lower lobe. Electronically Signed   By: Fidela Salisbury M.D.   On: 06/15/2015 09:12   Dg Abd 1 View  06/29/2015  CLINICAL DATA:  Patient has a naso gastic tube in place. HX copd, chf, htn, diabetes, ckd EXAM: ABDOMEN - 1 VIEW COMPARISON:  06/22/2015 and 06/09/2015. FINDINGS: 0601 hours. Two views  obtained. Nasogastric tube projects over the left upper quadrant of the abdomen consistent with position in the mid stomach. The abdomen is incompletely visualized due to body habitus. The bowel gas pattern appears nonobstructive. There is no evidence of free intraperitoneal air or suspicious calcification. Degenerative changes are noted within the spine associated with a mild convex left scoliosis. IMPRESSION: Nasogastric tube appears unchanged at the level of the mid stomach. Electronically Signed   By: Richardean Sale M.D.   On: 06/29/2015 08:11   Dg Abd 1 View  06/21/2015  CLINICAL DATA:  69 year old female status post nasogastric tube placement EXAM: ABDOMEN - 1 VIEW COMPARISON:  Prior abdominal radiograph 06/09/2015 FINDINGS: A nasogastric tube projects over the left lower quadrant in the region of the mid stomach (based on prior imaging). The bowel gas pattern is otherwise nonspecific. Opacification of the right lung base consistent with known large right-sided pleural effusion. No acute osseous abnormality. IMPRESSION: Well-positioned nasogastric tube overlying the gastric body. Electronically Signed   By: Jacqulynn Cadet M.D.   On: 06/20/2015 16:12   Ct Head Wo Contrast  06/20/2015  CLINICAL DATA:  Lung cancer.  Diabetes.  CHF.  Oxygen desaturation. EXAM: CT HEAD WITHOUT CONTRAST TECHNIQUE: Contiguous axial images were obtained from the base of the skull through the vertex without intravenous contrast. COMPARISON:  None FINDINGS: Sinuses/Soft tissues: Intubation. Bilateral mastoid effusions. Paranasal sinuses clear. Intracranial: Moderate motion degradation throughout. Moderate low density in the periventricular white matter likely related to small vessel disease. Cerebral atrophy which is  age advanced. Carotid and vertebral artery atherosclerosis. Given motion limitation, no mass lesion, hemorrhage, hydrocephalus, acute infarct, intra-axial, or extra-axial fluid collection. IMPRESSION: 1.  Moderate motion degraded exam. 2.  Cerebral atrophy and small vessel ischemic change. 3. No definite acute intracranial abnormality. Low sensitivity for intracranial metastasis secondary to noncontrast technique and motion. 4. Bilateral mastoid effusions. Electronically Signed   By: Abigail Miyamoto M.D.   On: 06/20/2015 15:33   Dg Chest Port 1 View  06/29/2015  CLINICAL DATA:  Ventilator patient. EXAM: PORTABLE CHEST 1 VIEW COMPARISON:  06/24/2015 and 06/22/2015. FINDINGS: 0557 hours. The tracheostomy, nasogastric tube and right arm PICC appear unchanged. The diffuse right lung airspace opacities appear mildly improved. There is stable left basilar opacity and small bilateral pleural effusions. No evidence of pneumothorax. The heart size and mediastinal contours are stable. IMPRESSION: Slight improvement in right lung airspace opacities. Stable support system. Electronically Signed   By: Richardean Sale M.D.   On: 06/29/2015 08:17   Dg Chest Port 1 View  06/22/2015  CLINICAL DATA:  69 year old female with history of respiratory failure. EXAM: PORTABLE CHEST 1 VIEW COMPARISON:  Chest x-ray 06/20/2015. FINDINGS: Tracheostomy tube in position with tip approximately 6.6 cm above the carina. A nasogastric tube is seen extending into the stomach, however, the tip of the nasogastric tube extends below the lower margin of the image. There is a right upper extremity PICC with tip terminating in the proximal superior vena cava. Lung volumes appear normal. Large right pleural effusion slightly decreased compared to the prior examination. Multifocal opacities throughout the right mid to lower lung and in the medial left lung base. No definite left pleural effusion. No evidence of pulmonary edema. Heart size appears borderline enlarged. The patient is rotated to the left on today's exam, resulting in distortion of the mediastinal contours and reduced diagnostic sensitivity and specificity for mediastinal pathology.  Atherosclerosis in the thoracic aorta. IMPRESSION: 1. Support apparatus, as above. 2. Slight decreased size of large right pleural effusion. Extensive areas of atelectasis and/or consolidation throughout the right mid to lower lung, and additionally in the medial left lung base. 3. Atherosclerosis. Electronically Signed   By: Vinnie Langton M.D.   On: 06/22/2015 14:10   Dg Chest Port 1 View  06/20/2015  CLINICAL DATA:  Ventilator dependent respiratory failure. Hypoxia. Followup right pleural effusion and postobstructive atelectasis/pneumonia in the right lower lobe and right middle lobe. EXAM: PORTABLE CHEST 1 VIEW COMPARISON:  06/19/2015 and earlier, including CT chest 06/06/2015. FINDINGS: Endotracheal tube tip in satisfactory position projecting approximately 6 cm above the carina. Right arm PICC tip projects at the junction of the right innominate vein and SVC, unchanged. Nasogastric tube courses below the diaphragm into the stomach. Since the 1114 hr examination yesterday, no change in the dense airspace consolidation in the right middle lobe and right lower lobe and the large right pleural effusion. Prominent bronchovascular markings in the left lung, unchanged, with improvement in the pulmonary venous hypertension. No new pulmonary parenchymal abnormalities. IMPRESSION: 1. Support apparatus satisfactory. 2. No change since yesterday in the dense atelectasis/pneumonia involving the right middle lobe and right lower lobe. 3. No change in the large right pleural effusion. 4. No new abnormalities. Electronically Signed   By: Evangeline Dakin M.D.   On: 06/20/2015 08:31   Dg Chest Port 1 View  06/19/2015  CLINICAL DATA:  Respiratory failure. Small cell carcinoma the lung. Right pleural effusion. EXAM: PORTABLE CHEST 1 VIEW 11:13 a.m. COMPARISON:  Radiographs from  06/08/2015 through 06/19/2015 at 5:42 a.m. and CT scan of the chest 06/06/2015 FINDINGS: The large right pleural effusion has decreased since  the prior exam. PICC tip in the upper superior vena cava. Tracheostomy tube in good position. NG tube tip below the diaphragm. Left lung is clear. Pulmonary vascularity is normal. Right mediastinal adenopathy is noted. IMPRESSION: Decreased large right pleural effusion.  No pneumothorax. Electronically Signed   By: Lorriane Shire M.D.   On: 06/19/2015 11:29   Dg Chest Port 1 View  06/19/2015  CLINICAL DATA:  Respiratory failure with increasing difficulty breathing. History of COPD, diabetes and lung cancer. EXAM: PORTABLE CHEST 1 VIEW COMPARISON:  Radiographs 06/18/2015 and 06/11/2015.  CT 06/06/2015. FINDINGS: 0954 hour. Right arm PICC has been slightly advanced to the level of the upper SVC. Tracheostomy and nasogastric tube appear unchanged. There is progressive volume loss and opacification in the right hemithorax consistent with progressive partial lung collapse. Underlying right pleural effusion appears unchanged. There is improved aeration of the left lung. The heart size appears unchanged. IMPRESSION: Progressive partial right lung collapse with increased pleural parenchymal opacities on the right hemithorax. Support system positioned as above. Electronically Signed   By: Richardean Sale M.D.   On: 06/19/2015 10:11   Dg Chest Port 1 View  06/18/2015  CLINICAL DATA:  Respiratory failure. EXAM: PORTABLE CHEST 1 VIEW COMPARISON:  06/18/2015. FINDINGS: Tracheostomy tube, NG tube, right PICC line stable position. PICC line tip projected over the right subclavian vein . Persistent cardiomegaly. Persistent bilateral pulmonary infiltrates particularly prominent on the right. Persistent bilateral pleural effusions. No interim improvement. No pneumothorax. IMPRESSION: 1. Lines and tubes in stable position. 2. Persistent cardiomegaly with unchanged bilateral pulmonary infiltrates, particular prominent on the right. Persistent bilateral pleural effusions. Findings are consistent with congestive heart failure  with asymmetric pulmonary edema. Pneumonia, particularly on the right cannot be excluded. Electronically Signed   By: Marcello Moores  Register   On: 06/18/2015 07:53   Dg Chest Port 1 View  05/31/2015  CLINICAL DATA:  Respiratory failure. EXAM: PORTABLE CHEST 1 VIEW COMPARISON:  06/25/2015. FINDINGS: Tracheostomy tube, NG tube, right PICC line in stable position. Cardiomegaly with pulmonary vascular prominence and bilateral pulmonary alveolar infiltrates particularly the right. Findings suggest congestive heart failure with asymmetric pulmonary edema. Pneumonia cannot be excluded. No pneumothorax. IMPRESSION: 1. Lines and tubes in stable position. 2. Cardiomegaly with persistent bilateral pulmonary infiltrates, particularly prominent on the right. Persistent right pleural effusion. Findings suggest congestive heart failure with asymmetric pulmonary edema. Pneumonia cannot be excluded. No interim change from prior exam . Electronically Signed   By: [place]   On: 05/29/2015 07:28   Dg Chest Port 1 View  06/12/2015  CLINICAL DATA:  Short of breath. EXAM: PORTABLE CHEST 1 VIEW COMPARISON:  06/09/2015 FINDINGS: Endotracheal tube remains in good position. Right arm PICC tip in the proximal SVC unchanged. NG tube in place with the tip not visualized Complete opacification of the right chest which has progressed since the prior study. This is likely due to pleural effusion and collapse. Underlying pneumonia or tumor not excluded. Cardiac enlargement. Vascular congestion and on the left without pulmonary edema or effusion on the left. IMPRESSION: Endotracheal tube remains in good position Complete opacification of the right chest due to enlarging effusion and collapse of the right lung. Electronically Signed   By: Franchot Gallo M.D.   On: 06/12/2015 07:44   Dg Chest Port 1 View  06/09/2015  CLINICAL DATA:  Intubation and OG  tube placement EXAM: PORTABLE CHEST 1 VIEW COMPARISON:  1 day prior FINDINGS:  Endotracheal tube terminates 4.1 cm above carina.Nasogastric tube extends beyond the inferior aspect of the film. A right-sided PICC line is poorly visualized centrally but likely terminates at the low SVC. The patient is minimally rotated left. Cardiomegaly accentuated by AP portable technique. Layering right pleural effusion. No pneumothorax. Interstitial edema is asymmetric and worse on the right. Lower lobe predominant right worse than left airspace disease is minimally improved. IMPRESSION: Minimal improvement in aeration since the exam of 1 day prior. Congestive heart failure with asymmetric interstitial and airspace opacities, worse on the right. Concurrent infection cannot be excluded. Layering right pleural effusion. Electronically Signed   By: Abigail Miyamoto M.D.   On: 06/09/2015 12:09   Dg Chest Port 1 View  06/08/2015  CLINICAL DATA:  Respiratory failure.  Followup exam. EXAM: PORTABLE CHEST 1 VIEW COMPARISON:  Chest CT and chest radiographs, 06/06/2015. FINDINGS: There is near complete opacification of the right hemi thorax consistent with combination of atelectasis/ consolidation and pleural fluid. There is, however, some more aeration of the mid to upper lung on the right than there was on the prior chest radiographs. Left lung is essentially clear.  No pneumothorax. Right-sided PICC is stable. Endotracheal tube and nasogastric tube have been removed. IMPRESSION: 1. Mild improvement from prior study with some more aeration evident of the right mid and upper lung. This is consistent with a decrease in lung consolidation/atelectasis. There is still significant opacification throughout most of the right hemi thorax. No new abnormalities. Electronically Signed   By: Lajean Manes M.D.   On: 06/08/2015 08:14   Dg Abd Portable 1v  06/22/2015  CLINICAL DATA:  NG tube residuals.  Check placement EXAM: PORTABLE ABDOMEN - 1 VIEW COMPARISON:  Radiograph 05/28/2015 FINDINGS: NG tube extends into the  stomach. The side port is below the GE junction. Tip is below the margin of film. Second film was provided which demonstrates tip within the gastric body. Bilateral basilar effusions and atelectasis. IMPRESSION: NG tube appears in proper position. Electronically Signed   By: Suzy Bouchard M.D.   On: 06/22/2015 09:29   Dg Abd Portable 1v  06/09/2015  CLINICAL DATA:  OG tube placement EXAM: PORTABLE ABDOMEN - 1 VIEW COMPARISON:  06/06/2015 FINDINGS: The nasogastric tube tip is in the body of stomach. Side port is well below the GE junction. Mild gaseous distension of large and small bowel loops unchanged. IMPRESSION: 1. NG tube tip is in the body of stomach. Electronically Signed   By: Kerby Moors M.D.   On: 06/09/2015 12:04    ASSESSMENT:  Small cell lung cancer.   PLAN:    1. Small cell lung cancer: Patient is likely stage IV given her suspicious adrenal lesion. CT of her head on June 20, 2015 did not report any metastatic disease.  CT scan results from June 06, 2015 revealed a positive response to cycle 1 of treatment with decreased size of her mass. Patient completed cycle 2 of cisplatin and etoposide on June 26, 2015. Cycle 3 will be scheduled in about 3 weeks after cycle 2 on approximately 2015-07-30. She will ultimately require port placement once she is off the ventilator. XRT would be helpful, but there are patient safety concerns transporting to radiation oncology. 2. Thrombocytopenia: Platelet count declining secondary to chemotherapy. Monitor. 3. Anemia:  Likely secondary to chemotherapy. Patient's hemoglobin has dropped to 5.8. She is scheduled to receive one unit  of packed red blood cells today. 4. Leukopenia: Patient's white blood cell count is now declining secondary to chemotherapy, continue daily Neupogen until [place] is greater than 1000. 5. Tracheostomy:  Appreciate ENT input. Tracheostomy in place.  6. Disposition: Long-term ventilation and tracheostomy  facilities have been discussed, but this poses a problem for transportation back and forth for chemotherapy. Case previously discussed with case management as well as long-term facility representative.   Will follow.   Lloyd Huger, MD   07/07/2015 10:00 PM

## 2015-07-07 NOTE — Progress Notes (Signed)
Pharmacy Consult for Constipation Prevention   Allergies  Allergen Reactions  . Atorvastatin Other (See Comments)    Does not remember why she had Intolerance to Lipitor.  . Rosuvastatin Anxiety    Chest tigtness and feeling as if she had heartburn.    Patient Measurements: Height: '5\' 7"'$  (170.2 cm) Weight: 204 lb 9.4 oz (92.8 kg) IBW/kg (Calculated) : 61.6   Vital Signs: Temp: 99.8 F (37.7 C) (12/12 1144) Temp Source: Oral (12/12 1144) BP: 99/46 mmHg (12/12 1200) Pulse Rate: 93 (12/12 1200) Intake/Output from previous day: 12/11 0701 - 12/12 0700 In: 4465.8 [I.V.:260.8; NG/GT:2660; IV Piggyback:755] Out: 475 [Urine:475] Intake/Output from this shift: Total I/O In: 730 [I.V.:10; Other:70; NG/GT:650] Out: -   Labs:  Recent Labs  07/05/15 0454 07/06/15 0440 07/07/15 0543  WBC  --  1.1* 2.0*  HGB  --  7.0* 5.6*  HCT  --  21.4* 17.2*  PLT  --  46* 37*  CREATININE 0.70 0.84 1.02*  MG 1.3* 1.8 2.0  PHOS 3.2 2.3* 3.5   Estimated Creatinine Clearance: 62.6 mL/min (by C-G formula based on Cr of 1.02).   Microbiology: Recent Results (from the past 720 hour(s))  Culture, blood (routine x 2)     Status: None   Collection Time: 06/12/15 11:54 AM  Result Value Ref Range Status   Specimen Description BLOOD LEFT ASSIST CONTROL  Final   Special Requests BOTTLES DRAWN AEROBIC AND ANAEROBIC  3CC  Final   Culture NO GROWTH 6 DAYS  Final   Report Status 06/18/2015 FINAL  Final  Culture, blood (routine x 2)     Status: None   Collection Time: 06/12/15 11:55 AM  Result Value Ref Range Status   Specimen Description BLOOD LEFT FA  Final   Special Requests   Final    BOTTLES DRAWN AEROBIC AND ANAEROBIC 2CC ANAERO 3CC AERO   Culture NO GROWTH 6 DAYS  Final   Report Status 06/18/2015 FINAL  Final  Urine culture     Status: None   Collection Time: 06/12/15 11:55 AM  Result Value Ref Range Status   Specimen Description URINE, CATHETERIZED  Final   Special Requests  Immunocompromised  Final   Culture 10,000 COLONIES/mL CANDIDA ALBICANS  Final   Report Status 06/15/2015 FINAL  Final  Culture, expectorated sputum-assessment     Status: None   Collection Time: 06/12/15  4:44 PM  Result Value Ref Range Status   Specimen Description TRACHEAL ASPIRATE  Final   Special Requests Immunocompromised  Final   Sputum evaluation THIS SPECIMEN IS ACCEPTABLE FOR SPUTUM CULTURE  Final   Report Status 06/12/2015 FINAL  Final  Culture, respiratory (NON-Expectorated)     Status: None   Collection Time: 06/12/15  4:44 PM  Result Value Ref Range Status   Specimen Description TRACHEAL ASPIRATE  Final   Special Requests Immunocompromised Reflexed from [id number]  Final   Gram Stain   Final    GOOD SPECIMEN - 80-90% WBCS MODERATE WBC SEEN MODERATE GRAM VARIABLE ROD FEW GRAM POSITIVE COCCI IN PAIRS MANY GRAM POSITIVE RODS    Culture Consistent with normal respiratory flora.  Final   Report Status 06/15/2015 FINAL  Final  Culture, expectorated sputum-assessment     Status: None   Collection Time: 06/14/15  5:19 PM  Result Value Ref Range Status   Specimen Description SPUTUM  Final   Special Requests NONE  Final   Sputum evaluation THIS SPECIMEN IS ACCEPTABLE FOR SPUTUM CULTURE  Final  Report Status 06/14/2015 FINAL  Final  Culture, respiratory (NON-Expectorated)     Status: None   Collection Time: 06/14/15  5:19 PM  Result Value Ref Range Status   Specimen Description SPUTUM  Final   Special Requests NONE Reflexed from [id number]  Final   Gram Stain   Final    FAIR SPECIMEN - 70-80% WBCS MODERATE WBC SEEN MANY GRAM POSITIVE RODS FEW GRAM POSITIVE COCCI IN PAIRS RARE GRAM NEGATIVE RODS    Culture Consistent with normal respiratory flora.  Final   Report Status 06/18/2015 FINAL  Final  Culture, blood (routine x 2)     Status: None   Collection Time: 06/18/15 11:39 AM  Result Value Ref Range Status   Specimen Description BLOOD LEFT HAND  Final   Special Requests    Final    BOTTLES DRAWN AEROBIC AND ANAEROBIC ANAERO [place] AERO 3CC   Culture NO GROWTH 5 DAYS  Final   Report Status 06/23/2015 FINAL  Final  Culture, blood (routine x 2)     Status: None   Collection Time: 06/18/15 12:10 PM  Result Value Ref Range Status   Specimen Description BLOOD  FLUID IN BODY  Final   Special Requests   Final    BOTTLES DRAWN AEROBIC AND ANAEROBIC AERO 1CC ANAERO    Culture NO GROWTH 5 DAYS  Final   Report Status 06/23/2015 FINAL  Final  Culture, respiratory (NON-Expectorated)     Status: None   Collection Time: 06/18/15 12:20 PM  Result Value Ref Range Status   Specimen Description TRACHEAL ASPIRATE  Final   Special Requests NONE  Final   Gram Stain   Final    MANY WBC SEEN MODERATE GRAM VARIABLE ROD EXCELLENT SPECIMEN - 90-100% WBCS    Culture Consistent with normal respiratory flora.  Final   Report Status 06/20/2015 FINAL  Final  Urine culture     Status: None   Collection Time: 06/18/15  2:30 PM  Result Value Ref Range Status   Specimen Description URINE, CATHETERIZED  Final   Special Requests NONE  Final   Culture INSIGNIFICANT GROWTH  Final   Report Status 06/20/2015 FINAL  Final  Culture, bal-quantitative     Status: None   Collection Time: 06/22/15 11:10 AM  Result Value Ref Range Status   Specimen Description SPUTUM  CYTO LAVA  Final   Special Requests Immunocompromised  Final   Gram Stain   Final    GOOD SPECIMEN - 80-90% WBCS MODERATE WBC SEEN FEW GRAM POSITIVE RODS RARE GRAM POSITIVE COCCI IN PAIRS    Culture Consistent with normal respiratory flora.  Final   Report Status 06/24/2015 FINAL  Final  Fungus Culture with Smear     Status: None (Preliminary result)   Collection Time: 06/22/15 11:10 AM  Result Value Ref Range Status   Specimen Description CYTO LAVA  Final   Special Requests Immunocompromised  Final   Culture NO FUNGUS ISOLATED AFTER 14 DAYS  Final   Report Status PENDING  Incomplete  C difficile quick scan w PCR reflex      Status: None   Collection Time: 06/25/15  2:26 PM  Result Value Ref Range Status   C Diff antigen NEGATIVE NEGATIVE Final   C Diff toxin NEGATIVE NEGATIVE Final   C Diff interpretation Negative for C. difficile  Final    Medical History: Past Medical History  Diagnosis Date  . Anxiety   . Arthritis   . COPD (chronic obstructive pulmonary disease) (  [place])   . CHF (congestive heart failure) ([place])   . Hyperlipidemia   . Hypertension   . Diabetes mellitus without complication ([place])   . Chronic kidney disease   . Neuromuscular disorder ([facility])     Medications:  Scheduled:  . antiseptic oral rinse  7 mL Mouth Rinse QID  . budesonide (PULMICORT) nebulizer solution  0.5 mg Nebulization BID  . chlorhexidine gluconate  15 mL Mouth Rinse BID  . clonazePAM  0.5 mg Per Tube BID  . collagenase   Topical Daily  . famotidine  20 mg Per Tube BID  . feeding supplement (PRO-STAT SUGAR FREE 64)  30 mL Per Tube BID  . filgrastim  300 mcg Subcutaneous Daily  . free water  350 mL Per Tube 4 times per day  . insulin aspart  0-20 Units Subcutaneous 6 times per day  . insulin glargine  20 Units Subcutaneous Daily  . levalbuterol  0.63 mg Nebulization Q6H  . losartan  50 mg Oral Daily  . metoprolol tartrate  100 mg Per Tube BID  . risperiDONE  1 mg Oral QHS   Infusions:  . feeding supplement (VITAL HIGH PROTEIN) 1,000 mL (07/06/15 0912)  . fentaNYL infusion INTRAVENOUS Stopped (07/07/15 0530)  . 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 Will begin senna-docusate 1 tab po bid prn and f/u am.   Napoleon Form 07/07/2015,12:06 PM

## 2015-07-07 NOTE — Progress Notes (Signed)
RN spoke with Dr. Mortimer Fries and made MD aware that patient's temperature is 100.2 and that RN is suppose to give patient a unit of blood. MD gave order to give '650mg'$  of tylenol once and then to start blood.

## 2015-07-07 NOTE — Progress Notes (Signed)
    Recent Labs Lab 07/02/15 1139 07/06/15 0440 07/07/15 0543  HGB 8.2* 7.0* 5.6*  HCT 25.9* 21.4* 17.2*  WBC 11.9* 1.1* 2.0*  PLT 116* 46* 37*   S/p chemo since 05/26/2015 x 2 per RN No active bleeding Not on vasopressors  A ANmeia of critical illness  P 1 unitr prbc  Dr. Brand Males, M.D., [facility].C.P Pulmonary and Critical Care Medicine Staff Physician [place] [facility] Pager: [phone], If no answer or between  15:00h - 7:00h: call [phone]  07/07/2015 6:28 AM

## 2015-07-07 NOTE — Progress Notes (Signed)
Nutrition Follow-up    INTERVENTION:   EN: continue current TF regimen as tolerated as per order set   NUTRITION DIAGNOSIS:   Inadequate oral intake related to acute illness as evidenced by NPO status. Being addressed via TF  GOAL:   Patient will meet greater than or equal to 90% of their needs  MONITOR:    (Energy Intake, Anthropometrics, Electrolyte/Renal Profile, Digestive System, Electrolyte/Renal Profile)  REASON FOR ASSESSMENT:   Consult Enteral/tube feeding initiation and management  ASSESSMENT:    Pt remains on vent  Diet Order:  Diet NPO time specified   EN: tolerating Vital High Protein at rate of 60 ml/hr, Prostat, 350 mL free water q 6 hours  Digestive System: rectal tube removed, no BM this shift thus far, no signs of TF intolerance  Skin:  Stage III pressure ulcer  Electrolyte and Renal Profile:  Recent Labs Lab 07/05/15 0454 07/06/15 0440 07/07/15 0543  BUN 51* 52* 58*  CREATININE 0.70 0.84 1.02*  NA 153* 149* 145  K 3.1* 3.7 3.8  MG 1.3* 1.8 2.0  PHOS 3.2 2.3* 3.5   Glucose Profile:  Recent Labs  07/07/15 0407 07/07/15 0747 07/07/15 0940  GLUCAP 115* 118* 117*   Meds: ss novolog, lantus, reglan prn, senokot prn  Height:   Ht Readings from Last 1 Encounters:  05/17/2015 '5\' 7"'$  (1.702 m)    Weight:   Wt Readings from Last 1 Encounters:  07/07/15 204 lb 9.4 oz (92.8 kg)   BMI:  Body mass index is 32.04 kg/(m^2).  Estimated Nutritional Needs:   Kcal:  1254-1596kcals, (11-14kcals/kg) using current weight of 114kg)  Protein:  122-153 g (2.0-2.5 g/kg IBW) or 140-176 g (1.2-1.5 g/kg current wt)  Fluid:  1535-18102m of fluid (25-354mkg)  EDUCATION NEEDS:   No education needs identified at this time  HILaSalleRD, LDN ([phone]

## 2015-07-07 NOTE — Progress Notes (Signed)
* Emily Pratt Pulmonary Medicine  69 year old female with new diagnosis of small cell lung cancer, subsequently intubated due to right lung atelectasis and respiratory failure, because of the lung cancer. She is now status post 2 cycles of chemotherapy to see if this tumor can be shrunk. S/p trach s/p PEG  difficult to wean from vent   Assessment and Plan:  Prolonged VDRF -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, chemotherapy; Significant improvement in airway patency by bronchoscopy 11/22. -Continue vent weaning, this and has been weaned to 35% trach collar. attempt TCT as tolerated -obtain ABG   Right lung, small cell lung cancer. -Status post 2 cycles of chemotherapy  -s/p PEG 06/29/2015 -rad/onc willing to do palliative radiation once completely liberated from the vent.   COPD  -Cont nebulized steroids, does not appear to be in exacerbation at this time  -Cont nebulized bronchodilators.  PSVT  -Cont scheduled enteral metoprolol  Anemia/leukopenia/thrombocytopenia - pancytopenia -We'll continue to monitor CBC, transfuse as needed. Will get 1 unit today  Agitated, delirium. Severe debility and deconditioning - now improving -Likely some degree of sundowning, continue benzodiazepines, including Xanax, Klonopin, and when necessary Versed. Continue IV fentanyl. -IV Precedex was discontinued as she had likely developed some degree of tachyphylaxis.   Hypernatremia -Improved after stopping Lasix, and continuing with free water replacement. We'll continue to monitor. - s/p PEG placement, continue  Free H2O flushes.  Volume overload - resolved, will monitor. The patient is now off Lasix.     Monitor BMET intermittently,PT/OT Monitor I/Os DVT px: SCDs Monitor CBC intermittently Transfuse per usual guidelines   Date: 07/07/2015  MRN# [id number] Emily Pratt [birth year]   Emily Pratt is a 69 y.o. old female seen in follow up for chief complaint of  Chief Complaint    Patient presents with  . Respiratory Distress     HPI/ROS: Currently on PSV, TCT today, patient very lethargic this AM, unable to provide ROS Will obtain ABG  Allergies:  Atorvastatin and Rosuvastatin  Review of Systems  Unable to perform ROS: acuity of condition     Physical Examination:   VS: BP 96/44 mmHg  Pulse 100  Temp(Src) 99 F (37.2 C) (Axillary)  Resp 18  Ht '5\' 7"'$  (1.702 m)  Wt 204 lb 9.4 oz (92.8 kg)  BMI 32.04 kg/m2  SpO2 96%  General Appearance: lethargic,somnelent,  Neuro:without focal findings, strength improving bilaterally (4/5). HEENT: PERRLA, EOM intact. Pulmonary: coarse upper airway sounds, upper airway congestion,  CardiovascularNormal S1,S2.  No m/r/g.   Abdomen: Benign, Soft, mild tender, LUQ PEG in place.  Renal:  No costovertebral tenderness  GU:  Not performed at this time. Endoc: No evident thyromegaly, no signs of acromegaly. Skin:   warm, no rash. Extremities: normal, no cyanosis, clubbing.   LABORATORY PANEL:   CBC  Recent Labs Lab 07/07/15 0543  WBC 2.0*  HGB 5.6*  HCT 17.2*  PLT 37*   ------------------------------------------------------------------------------------------------------------------  Chemistries   Recent Labs Lab 07/07/15 0543  NA 145  K 3.8  CL 113*  CO2 27  GLUCOSE 133*  BUN 58*  CREATININE 1.02*  CALCIUM 8.5*  MG 2.0   ------------------------------------------------------------------------------------------------------------------  Cardiac Enzymes No results for input(s): TROPONINI in the last 168 hours. ------------------------------------------------------------  RADIOLOGY:   No results found for this or any previous visit. No results found for this or any previous visit. ------------------------------------------------------------------------------------------------------------------   The Patient requires high complexity decision making for assessment and support, frequent  evaluation  and titration of therapies, application of advanced monitoring technologies and extensive interpretation of multiple databases. Critical Care Time devoted to patient care services described in this note is 45  minutes.   Overall, patient is critically ill, prognosis is guarded.   Emily Pratt, M.D.  [facility]  Medical Director [place] Director [facility] Cardio-Pulmonary Department

## 2015-07-07 NOTE — Progress Notes (Signed)
Watching tv and calmer since fentanyl drip has been restarted. Sitter at bedside and safety mittens on. Patient became upset when husband visited. Followed commands and communicated with RN and sitter this afternoon. NSR-ST per cardiac monitor. HR increases with agitation.  VSS. Tolerating tube feeds. Report given to Hinton Dyer, Therapist, sports.

## 2015-07-07 NOTE — Progress Notes (Signed)
PT Cancellation Note  Patient Details Name: Emily Pratt MRN: [id number] DOB: 21-Jan-1946   Cancelled Treatment:    Reason Eval/Treat Not Completed: Medical issues which prohibited therapy (Chart reviewed for attempted treatment session.  Patient noted with HgB 5.6; pending transfusion this date.  Will hold at this time and re-attempt post transfusion as medically appropriate.)   Ceniya Fowers H. Owens Shark, PT, DPT, NCS 07/07/2015, 10:51 AM [phone]

## 2015-07-08 LAB — GLUCOSE, CAPILLARY
Glucose-Capillary: 106 mg/dL — ABNORMAL HIGH (ref 65–99)
Glucose-Capillary: 113 mg/dL — ABNORMAL HIGH (ref 65–99)
Glucose-Capillary: 118 mg/dL — ABNORMAL HIGH (ref 65–99)
Glucose-Capillary: 130 mg/dL — ABNORMAL HIGH (ref 65–99)
Glucose-Capillary: 132 mg/dL — ABNORMAL HIGH (ref 65–99)
Glucose-Capillary: 134 mg/dL — ABNORMAL HIGH (ref 65–99)

## 2015-07-08 LAB — BLOOD GAS, ARTERIAL
Acid-Base Excess: 2.2 mmol/L (ref 0.0–3.0)
Allens test (pass/fail): POSITIVE — AB
BICARBONATE: 28.2 meq/L — AB (ref 21.0–28.0)
FIO2: 30
MECHVT: 500 mL
O2 Saturation: 88.2 %
PEEP/CPAP: 5 cmH2O
Patient temperature: 37
RATE: 15 resp/min
pCO2 arterial: 51 mmHg — ABNORMAL HIGH (ref 32.0–48.0)
pH, Arterial: 7.35 (ref 7.350–7.450)
pO2, Arterial: 58 mmHg — ABNORMAL LOW (ref 83.0–108.0)

## 2015-07-08 LAB — PHOSPHORUS: Phosphorus: 2.5 mg/dL (ref 2.5–4.6)

## 2015-07-08 LAB — BASIC METABOLIC PANEL
Anion gap: 5 (ref 5–15)
BUN: 57 mg/dL — AB (ref 6–20)
CHLORIDE: 112 mmol/L — AB (ref 101–111)
CO2: 26 mmol/L (ref 22–32)
Calcium: 8.6 mg/dL — ABNORMAL LOW (ref 8.9–10.3)
Creatinine, Ser: 0.89 mg/dL (ref 0.44–1.00)
GFR calc Af Amer: >60 mL/min (ref >60)
GFR calc non Af Amer: >60 mL/min (ref >60)
GLUCOSE: 151 mg/dL — AB (ref 65–99)
POTASSIUM: 3.5 mmol/L (ref 3.5–5.1)
SODIUM: 143 mmol/L (ref 135–145)

## 2015-07-08 LAB — HEMOGLOBIN AND HEMATOCRIT, BLOOD
HEMATOCRIT: 22.1 % — AB (ref 35.0–47.0)
Hemoglobin: 7.2 g/dL — ABNORMAL LOW (ref 12.0–16.0)

## 2015-07-08 LAB — MAGNESIUM: MAGNESIUM: 1.6 mg/dL — AB (ref 1.7–2.4)

## 2015-07-08 MED ORDER — SODIUM CHLORIDE 0.9 % IJ SOLN: 10.0000 mL | INTRAMUSCULAR | Status: DC | PRN

## 2015-07-08 MED ORDER — LORAZEPAM 2 MG/ML IJ SOLN
2.0000 mg | INTRAMUSCULAR | Status: AC
  Administered 2015-07-08: 2 mg via INTRAVENOUS
  Filled 2015-07-08: qty 1

## 2015-07-08 MED ORDER — LORAZEPAM 2 MG/ML IJ SOLN
2.0000 mg | INTRAMUSCULAR | Status: DC | PRN
  Administered 2015-07-08 – 2015-07-15 (×21): 2 mg via INTRAVENOUS
  Filled 2015-07-08 (×22): qty 1

## 2015-07-08 MED ORDER — MORPHINE SULFATE (PF) 2 MG/ML IV SOLN
2.0000 mg | INTRAVENOUS | Status: DC | PRN
  Administered 2015-07-09 – 2015-07-15 (×12): 2 mg via INTRAVENOUS
  Filled 2015-07-08 (×12): qty 1

## 2015-07-08 MED ORDER — MORPHINE SULFATE (PF) 2 MG/ML IV SOLN
2.0000 mg | INTRAVENOUS | Status: AC
  Administered 2015-07-08: 2 mg via INTRAVENOUS
  Filled 2015-07-08: qty 1

## 2015-07-08 MED ORDER — SODIUM CHLORIDE 0.9 % IJ SOLN
10.0000 mL | Freq: Two times a day (BID) | INTRAMUSCULAR | Status: DC
  Administered 2015-07-08 – 2015-07-10 (×4): 10 mL
  Administered 2015-07-11: 20 mL
  Administered 2015-07-11 – 2015-07-12 (×2): 10 mL
  Administered 2015-07-12: 20 mL
  Administered 2015-07-13 – 2015-07-15 (×5): 10 mL

## 2015-07-08 MED ORDER — QUETIAPINE FUMARATE 25 MG PO TABS
25.0000 mg | ORAL_TABLET | Freq: Every day | ORAL | Status: DC
  Administered 2015-07-08 – 2015-07-09 (×2): 25 mg via ORAL
  Filled 2015-07-08 (×2): qty 1

## 2015-07-08 MED ORDER — ESCITALOPRAM OXALATE 10 MG PO TABS
5.0000 mg | ORAL_TABLET | Freq: Every day | ORAL | Status: DC
  Administered 2015-07-08 – 2015-07-15 (×8): 5 mg
  Filled 2015-07-08: qty 1
  Filled 2015-07-08: qty 2
  Filled 2015-07-08 (×6): qty 1
  Filled 2015-07-08: qty 5

## 2015-07-08 MED ORDER — SENNOSIDES-DOCUSATE SODIUM 8.6-50 MG PO TABS
1.0000 | ORAL_TABLET | Freq: Two times a day (BID) | ORAL | Status: DC
  Administered 2015-07-08 – 2015-07-09 (×3): 1 via ORAL
  Filled 2015-07-08 (×2): qty 1

## 2015-07-08 MED ORDER — MAGNESIUM SULFATE 2 GM/50ML IV SOLN
2.0000 g | Freq: Once | INTRAVENOUS | Status: AC
  Administered 2015-07-08: 2 g via INTRAVENOUS
  Filled 2015-07-08 (×3): qty 50

## 2015-07-08 NOTE — Progress Notes (Signed)
PT Cancellation Note  Patient Details Name: Emily Pratt MRN: [id number] DOB: July 17, 1946   Cancelled Treatment:    Reason Eval/Treat Not Completed: Medical issues which prohibited therapy (Treatment session attempted; patient currently sleeping/sedated secondary to agitation this AM.  Unable to participate with PT session at this time.  Will re-attempt at later time/date as medically appropriate)   Chaden Doom H. Owens Shark, PT, DPT, NCS 07/08/2015, 11:31 AM [phone]

## 2015-07-08 NOTE — Consult Note (Signed)
Electrolyte CONSULT NOTE - FOLLOW UP  Pharmacy Consult for Electrolyte monitoring and replacement  Patient Measurements: Height: '5\' 7"'$  (170.2 cm) Weight: 207 lb 3.2 oz (93.985 kg) IBW/kg (Calculated) : 61.6  Vital Signs: Temp: 98.2 F (36.8 C) (12/13 0725) Temp Source: Axillary (12/13 0725) BP: 144/61 mmHg (12/13 0915) Pulse Rate: 148 (12/13 0915)  Recent Labs  07/06/15 0440 07/07/15 0543 07/08/15 0613  NA 149* 145 143  K 3.7 3.8 3.5  CL 114* 113* 112*  CO2 '26 27 26  '$ GLUCOSE 186* 133* 151*  BUN 52* 58* 57*  CREATININE 0.84 1.02* 0.89  CALCIUM 8.8* 8.5* 8.6*  MG 1.8 2.0 1.6*  PHOS 2.3* 3.5 2.5   Estimated Creatinine Clearance: 72.2 mL/min (by C-G formula based on Cr of 0.89).   Recent Labs  07/07/15 2355 07/08/15 0515 07/08/15 0727  GLUCAP 104* 118* 132*    Assessment: 69 yo female with SCLC current;y intubated in ICU. Pharmacy consulted for monitoring and managing electrolytes.   Mag= 1.6  Plan:  Magnesium level is low so will replace with 2 g magnesium sulfate iv once and f/u am labs.   Ulice Dash, PharmD Clinical Pharmacist  07/08/2015

## 2015-07-08 NOTE — Progress Notes (Signed)
* [place] [facility]  69 year old female with new diagnosis of small cell lung cancer, subsequently intubated due to right lung atelectasis and respiratory failure, because of the lung cancer. She is now status post 2 cycles of chemotherapy to see if this tumor can be shrunk. S/p trach s/p PEG  difficult to wean from vent   Assessment and Plan:  Prolonged VDRF -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, chemotherapy; Significant improvement in airway patency by bronchoscopy 11/22. -Continue vent weaning, this and has been weaned to 35% trach collar. -placed back on full vent support due to acidosis and hypoxia-will attempt to wean to PS mode   Right lung, small cell lung cancer. -Status post 2 cycles of chemotherapy  -s/p PEG 07/10/2015 -rad/onc willing to do palliative radiation once completely liberated from the vent.   COPD  -Cont nebulized steroids, does not appear to be in exacerbation at this time  -Cont nebulized bronchodilators.  PSVT  -Cont scheduled enteral metoprolol  Anemia/leukopenia/thrombocytopenia - pancytopenia -We'll continue to monitor CBC, transfuse as needed. Will get another 1 unit today, follow h/h  Agitated, delirium. Severe debility and deconditioning - now improving -Likely some degree of sundowning, continue benzodiazepines, including, Klonopin, and when necessary -fentanyl infusion,  Versed as needed   Hypernatremia -Improved after stopping Lasix, and continuing with free water replacement. We'll continue to monitor. - s/p PEG placement, continue  Free H2O flushes.  Volume overload - resolved, will monitor. The patient is now off Lasix.     Monitor BMET intermittently,PT/OT Monitor I/Os DVT px: SCDs Monitor CBC intermittently Transfuse per usual guidelines   Date: 07/08/2015  MRN# [id number] Emily Pratt [birth year]   Mikeila Burgen Marinez is a 70 y.o. old female seen in follow up for chief complaint of  Chief Complaint  Patient  presents with  . Respiratory Distress     HPI/ROS: Currently on AC mode, will cotninue v ent weaning as tolerated,sedation as tolerated unable to provide ROS Will obtain ABG as needed  Allergies:  Atorvastatin and Rosuvastatin  Review of Systems  Unable to perform ROS: acuity of condition     Physical Examination:   VS: BP 144/61 mmHg  Pulse 148  Temp(Src) 98.2 F (36.8 C) (Axillary)  Resp 15  Ht '5\' 7"'$  (1.702 m)  Wt 207 lb 3.2 oz (93.985 kg)  BMI 32.44 kg/m2  SpO2 93%  General Appearance: lethargic,somnelent,  Neuro:without focal findings,confused, agitated HEENT: PERRLA, EOM intact. Pulmonary: coarse upper airway sounds, upper airway congestion,  CardiovascularNormal S1,S2.  No m/r/g.   Abdomen: Benign, Soft, mild tender, LUQ PEG in place.  Renal:  No costovertebral tenderness  GU:  Not performed at this time. Endoc: No evident thyromegaly, no signs of acromegaly. Skin:   warm, no rash. Extremities: normal, no cyanosis, clubbing.   LABORATORY PANEL:   CBC  Recent Labs Lab 07/07/15 0543 07/08/15 0746  WBC 2.0*  --   HGB 5.6* 7.2*  HCT 17.2* 22.1*  PLT 37*  --    ------------------------------------------------------------------------------------------------------------------  Chemistries   Recent Labs Lab 07/08/15 0613  NA 143  K 3.5  CL 112*  CO2 26  GLUCOSE 151*  BUN 57*  CREATININE 0.89  CALCIUM 8.6*  MG 1.6*   ------------------------------------------------------------------------------------------------------------------    The Patient requires high complexity decision making for assessment and support, frequent evaluation and titration of therapies, application of advanced monitoring technologies and extensive interpretation of multiple databases. Critical Care Time devoted to patient care services described  in this note is 35  minutes.   Overall, patient is critically ill, prognosis is guarded.   Corrin Parker, M.D.  [facility]  Pulmonary & Critical Care Medicine  Medical Director [place] Director [facility] Cardio-Pulmonary Department

## 2015-07-08 NOTE — Progress Notes (Signed)
8 AM gastric residual 0 mL. 12pm gastric residual 0 ml. Pt has been very agitated this morning. Attempting to hit staff, telling staff to leave the room. HR went to 160's. Dr. Mortimer Fries was notified and assessed pt, Geodon, morphine and lorazepam administered. Pt is resting more comfortably now.

## 2015-07-08 NOTE — Progress Notes (Signed)
Pharmacy Consult for Constipation Prevention   Allergies  Allergen Reactions  . Atorvastatin Other (See Comments)    Does not remember why she had Intolerance to Lipitor.  . Rosuvastatin Anxiety    Chest tigtness and feeling as if she had heartburn.    Patient Measurements: Height: '5\' 7"'$  (170.2 cm) Weight: 207 lb 3.2 oz (93.985 kg) IBW/kg (Calculated) : 61.6   Vital Signs: Temp: 98.2 F (36.8 C) (12/13 0725) Temp Source: Axillary (12/13 0725) BP: 86/42 mmHg (12/13 1000) Pulse Rate: 92 (12/13 1000) Intake/Output from previous day: 12/12 0701 - 12/13 0700 In: 2657.9 [I.V.:197.9; Blood:310; NG/GT:2080] Out: 1150 [Urine:1150] Intake/Output from this shift:    Labs:  Recent Labs  07/06/15 0440 07/07/15 0543 07/08/15 0613 07/08/15 0746  WBC 1.1* 2.0*  --   --   HGB 7.0* 5.6*  --  7.2*  HCT 21.4* 17.2*  --  22.1*  PLT 46* 37*  --   --   CREATININE 0.84 1.02* 0.89  --   MG 1.8 2.0 1.6*  --   PHOS 2.3* 3.5 2.5  --    Estimated Creatinine Clearance: 72.2 mL/min (by C-G formula based on Cr of 0.89).   Microbiology: Recent Results (from the past 720 hour(s))  Culture, blood (routine x 2)     Status: None   Collection Time: 06/12/15 11:54 AM  Result Value Ref Range Status   Specimen Description BLOOD LEFT ASSIST CONTROL  Final   Special Requests BOTTLES DRAWN AEROBIC AND ANAEROBIC  3CC  Final   Culture NO GROWTH 6 DAYS  Final   Report Status 06/18/2015 FINAL  Final  Culture, blood (routine x 2)     Status: None   Collection Time: 06/12/15 11:55 AM  Result Value Ref Range Status   Specimen Description BLOOD LEFT FA  Final   Special Requests   Final    BOTTLES DRAWN AEROBIC AND ANAEROBIC 2CC ANAERO 3CC AERO   Culture NO GROWTH 6 DAYS  Final   Report Status 06/18/2015 FINAL  Final  Urine culture     Status: None   Collection Time: 06/12/15 11:55 AM  Result Value Ref Range Status   Specimen Description URINE, CATHETERIZED  Final   Special Requests  Immunocompromised  Final   Culture 10,000 COLONIES/mL CANDIDA ALBICANS  Final   Report Status 06/15/2015 FINAL  Final  Culture, expectorated sputum-assessment     Status: None   Collection Time: 06/12/15  4:44 PM  Result Value Ref Range Status   Specimen Description TRACHEAL ASPIRATE  Final   Special Requests Immunocompromised  Final   Sputum evaluation THIS SPECIMEN IS ACCEPTABLE FOR SPUTUM CULTURE  Final   Report Status 06/12/2015 FINAL  Final  Culture, respiratory (NON-Expectorated)     Status: None   Collection Time: 06/12/15  4:44 PM  Result Value Ref Range Status   Specimen Description TRACHEAL ASPIRATE  Final   Special Requests Immunocompromised Reflexed from [id number]  Final   Gram Stain   Final    GOOD SPECIMEN - 80-90% WBCS MODERATE WBC SEEN MODERATE GRAM VARIABLE ROD FEW GRAM POSITIVE COCCI IN PAIRS MANY GRAM POSITIVE RODS    Culture Consistent with normal respiratory flora.  Final   Report Status 06/15/2015 FINAL  Final  Culture, expectorated sputum-assessment     Status: None   Collection Time: 06/14/15  5:19 PM  Result Value Ref Range Status   Specimen Description SPUTUM  Final   Special Requests NONE  Final   Sputum evaluation THIS  SPECIMEN IS ACCEPTABLE FOR SPUTUM CULTURE  Final   Report Status 06/14/2015 FINAL  Final  Culture, respiratory (NON-Expectorated)     Status: None   Collection Time: 06/14/15  5:19 PM  Result Value Ref Range Status   Specimen Description SPUTUM  Final   Special Requests NONE Reflexed from [id number]  Final   Gram Stain   Final    FAIR SPECIMEN - 70-80% WBCS MODERATE WBC SEEN MANY GRAM POSITIVE RODS FEW GRAM POSITIVE COCCI IN PAIRS RARE GRAM NEGATIVE RODS    Culture Consistent with normal respiratory flora.  Final   Report Status 05/30/2015 FINAL  Final  Culture, blood (routine x 2)     Status: None   Collection Time: 06/18/15 11:39 AM  Result Value Ref Range Status   Specimen Description BLOOD LEFT HAND  Final   Special Requests    Final    BOTTLES DRAWN AEROBIC AND ANAEROBIC ANAERO [place] AERO 3CC   Culture NO GROWTH 5 DAYS  Final   Report Status 06/23/2015 FINAL  Final  Culture, blood (routine x 2)     Status: None   Collection Time: 06/18/15 12:10 PM  Result Value Ref Range Status   Specimen Description BLOOD  FLUID IN BODY  Final   Special Requests   Final    BOTTLES DRAWN AEROBIC AND ANAEROBIC AERO 1CC ANAERO    Culture NO GROWTH 5 DAYS  Final   Report Status 06/23/2015 FINAL  Final  Culture, respiratory (NON-Expectorated)     Status: None   Collection Time: 06/18/15 12:20 PM  Result Value Ref Range Status   Specimen Description TRACHEAL ASPIRATE  Final   Special Requests NONE  Final   Gram Stain   Final    MANY WBC SEEN MODERATE GRAM VARIABLE ROD EXCELLENT SPECIMEN - 90-100% WBCS    Culture Consistent with normal respiratory flora.  Final   Report Status 06/20/2015 FINAL  Final  Urine culture     Status: None   Collection Time: 06/18/15  2:30 PM  Result Value Ref Range Status   Specimen Description URINE, CATHETERIZED  Final   Special Requests NONE  Final   Culture INSIGNIFICANT GROWTH  Final   Report Status 06/20/2015 FINAL  Final  Culture, bal-quantitative     Status: None   Collection Time: 06/22/15 11:10 AM  Result Value Ref Range Status   Specimen Description SPUTUM  CYTO LAVA  Final   Special Requests Immunocompromised  Final   Gram Stain   Final    GOOD SPECIMEN - 80-90% WBCS MODERATE WBC SEEN FEW GRAM POSITIVE RODS RARE GRAM POSITIVE COCCI IN PAIRS    Culture Consistent with normal respiratory flora.  Final   Report Status 06/24/2015 FINAL  Final  Fungus Culture with Smear     Status: None (Preliminary result)   Collection Time: 06/22/15 11:10 AM  Result Value Ref Range Status   Specimen Description CYTO LAVA  Final   Special Requests Immunocompromised  Final   Culture NO FUNGUS ISOLATED AFTER 16 DAYS  Final   Report Status PENDING  Incomplete  C difficile quick scan w PCR reflex      Status: None   Collection Time: 06/25/15  2:26 PM  Result Value Ref Range Status   C Diff antigen NEGATIVE NEGATIVE Final   C Diff toxin NEGATIVE NEGATIVE Final   C Diff interpretation Negative for C. difficile  Final    Medical History: Past Medical History  Diagnosis Date  . Anxiety   .  Arthritis   . COPD (chronic obstructive pulmonary disease) ([place])   . CHF (congestive heart failure) ([place])   . Hyperlipidemia   . Hypertension   . Diabetes mellitus without complication ([place])   . Chronic kidney disease   . Neuromuscular disorder (HCC)     Medications:  Scheduled:  . antiseptic oral rinse  7 mL Mouth Rinse QID  . budesonide (PULMICORT) nebulizer solution  0.5 mg Nebulization BID  . chlorhexidine gluconate  15 mL Mouth Rinse BID  . clonazePAM  0.5 mg Per Tube BID  . collagenase   Topical Daily  . escitalopram  5 mg Per Tube Daily  . famotidine  20 mg Per Tube BID  . feeding supplement (PRO-STAT SUGAR FREE 64)  30 mL Per Tube BID  . filgrastim  300 mcg Subcutaneous Daily  . free water  350 mL Per Tube 4 times per day  . insulin aspart  0-20 Units Subcutaneous 6 times per day  . insulin glargine  20 Units Subcutaneous Daily  . levalbuterol  0.63 mg Nebulization Q6H  . losartan  50 mg Oral Daily  . magnesium sulfate 1 - 4 g bolus IVPB  2 g Intravenous Once  . metoprolol tartrate  100 mg Per Tube BID  . risperiDONE  1 mg Oral QHS  . senna-docusate  1 tablet Oral BID   Infusions:  . feeding supplement (VITAL HIGH PROTEIN) 1,000 mL (07/07/15 2304)  . fentaNYL infusion INTRAVENOUS 150 mcg/hr (07/08/15 1000)  . 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  Plan:  Patient still w/o BM since 12/10 so will change senna-docusate to 1 tab po bid scheduled and f/u am.   Ulice Dash D 07/08/2015,11:38 AM

## 2015-07-09 LAB — PREPARE RBC (CROSSMATCH)

## 2015-07-09 LAB — CBC WITH DIFFERENTIAL/PLATELET
BASOS ABS: 0.2 10*3/uL — AB (ref 0–0.1)
Basophils Relative: 2 %
EOS PCT: 1 %
Eosinophils Absolute: 0.1 10*3/uL (ref 0–0.7)
HEMATOCRIT: 19.4 % — AB (ref 35.0–47.0)
Hemoglobin: 6.3 g/dL — ABNORMAL LOW (ref 12.0–16.0)
Lymphocytes Relative: 18 %
Lymphs Abs: 1.4 10*3/uL (ref 1.0–3.6)
MCH: 29.8 pg (ref 26.0–34.0)
MCHC: 32.3 g/dL (ref 32.0–36.0)
MCV: 92.3 fL (ref 80.0–100.0)
MONO ABS: 0.4 10*3/uL (ref 0.2–0.9)
Monocytes Relative: 5 %
NEUTROS PCT: 74 %
Neutro Abs: 5.5 10*3/uL (ref 1.4–6.5)
PLATELETS: 59 10*3/uL — AB (ref 150–440)
RBC: 2.1 MIL/uL — ABNORMAL LOW (ref 3.80–5.20)
RDW: 17.7 % — AB (ref 11.5–14.5)
WBC: 7.6 10*3/uL (ref 3.6–11.0)

## 2015-07-09 LAB — BASIC METABOLIC PANEL
ANION GAP: 6 (ref 5–15)
BUN: 56 mg/dL — ABNORMAL HIGH (ref 6–20)
CALCIUM: 8.8 mg/dL — AB (ref 8.9–10.3)
CO2: 26 mmol/L (ref 22–32)
CREATININE: 0.85 mg/dL (ref 0.44–1.00)
Chloride: 114 mmol/L — ABNORMAL HIGH (ref 101–111)
GFR calc Af Amer: >60 mL/min (ref >60)
GFR calc non Af Amer: >60 mL/min (ref >60)
GLUCOSE: 118 mg/dL — AB (ref 65–99)
Potassium: 3.7 mmol/L (ref 3.5–5.1)
Sodium: 146 mmol/L — ABNORMAL HIGH (ref 135–145)

## 2015-07-09 LAB — GLUCOSE, CAPILLARY
Glucose-Capillary: 114 mg/dL — ABNORMAL HIGH (ref 65–99)
Glucose-Capillary: 117 mg/dL — ABNORMAL HIGH (ref 65–99)
Glucose-Capillary: 138 mg/dL — ABNORMAL HIGH (ref 65–99)
Glucose-Capillary: 146 mg/dL — ABNORMAL HIGH (ref 65–99)
Glucose-Capillary: 158 mg/dL — ABNORMAL HIGH (ref 65–99)

## 2015-07-09 LAB — PHOSPHORUS: Phosphorus: 1.5 mg/dL — ABNORMAL LOW (ref 2.5–4.6)

## 2015-07-09 LAB — MAGNESIUM: Magnesium: 1.8 mg/dL (ref 1.7–2.4)

## 2015-07-09 MED ORDER — POTASSIUM PHOSPHATES 15 MMOLE/5ML IV SOLN
20.0000 mmol | Freq: Once | INTRAVENOUS | Status: AC
  Administered 2015-07-09: 20 mmol via INTRAVENOUS
  Filled 2015-07-09: qty 6.67

## 2015-07-09 MED ORDER — SODIUM CHLORIDE 0.9 % IV SOLN
Freq: Once | INTRAVENOUS | Status: AC
  Administered 2015-07-09: 17:00:00 via INTRAVENOUS

## 2015-07-09 MED ORDER — SENNOSIDES-DOCUSATE SODIUM 8.6-50 MG PO TABS
2.0000 | ORAL_TABLET | Freq: Two times a day (BID) | ORAL | Status: DC
  Administered 2015-07-09 – 2015-07-11 (×4): 2 via ORAL
  Filled 2015-07-09 (×4): qty 2

## 2015-07-09 NOTE — Progress Notes (Signed)
Palliative Care Update   Palliative Care re-consult is initiated.  I just haven't been able to have pertinent discussions with pt or husband as yet --but will focus on getting to this in the am of 12/ 15/ 16.     Colleen Can, MD

## 2015-07-09 NOTE — Progress Notes (Signed)
Speech Therapy Note: reviewed chart notes(pt has been tolerating the PEG placement; pt is back on vent support sec. to respiratory status w/ weaning fully to the trach collar continuing to be the goal. When pt's respiratory and medical status' are improved and she is able achieve full trach collar, pt would be a better candidate to safely use the PMV for verbal communication w/ others. ST will f/u to monitor pt's status and appropriate time for continued PMV tx, possible po trials then as well.

## 2015-07-09 NOTE — Consult Note (Signed)
Electrolyte CONSULT NOTE - FOLLOW UP  Pharmacy Consult for Electrolyte monitoring and replacement  Patient Measurements: Height: '5\' 7"'$  (170.2 cm) Weight: 207 lb 10.8 oz (94.2 kg) IBW/kg (Calculated) : 61.6  Vital Signs: Temp: 99.3 F (37.4 C) (12/14 0600) Temp Source: Oral (12/14 0600) BP: 117/51 mmHg (12/14 1103) Pulse Rate: 97 (12/14 1103)  Recent Labs  07/07/15 0543 07/08/15 0613 07/09/15 0500  NA 145 143 146*  K 3.8 3.5 3.7  CL 113* 112* 114*  CO2 '27 26 26  '$ GLUCOSE 133* 151* 118*  BUN 58* 57* 56*  CREATININE 1.02* 0.89 0.85  CALCIUM 8.5* 8.6* 8.8*  MG 2.0 1.6* 1.8  PHOS 3.5 2.5 1.5*   Estimated Creatinine Clearance: 75.6 mL/min (by C-G formula based on Cr of 0.85).   Recent Labs  07/08/15 2333 07/09/15 0409 07/09/15 0734  GLUCAP 106* 114* 117*    Assessment: 69 yo female with SCLC current;y intubated in ICU. Pharmacy consulted for monitoring and managing electrolytes.   Phos= 1.5  Plan:  Labs are wnl except for phos which is low and has been replaced with 20 mmol Kphos. Will f/u am labs.   Ulice Dash, PharmD Clinical Pharmacist  07/09/2015

## 2015-07-09 NOTE — Progress Notes (Signed)
Gastric Residual 4m

## 2015-07-09 NOTE — Progress Notes (Signed)
PT Cancellation Note  Patient Details Name: Emily Pratt MRN: [id number] DOB: August 22, 1945   Cancelled Treatment:    Reason Eval/Treat Not Completed: Medical issues which prohibited therapy (Per primary RN, patient lethargic, limited ability to participate at this time.  Upon further chart review, noted with HgB 6.3; will hold PT at this time and re-attempt next date as appropriate.)   Myrle Wanek H. Owens Shark, PT, DPT, NCS 07/09/2015, 3:02 PM [phone]

## 2015-07-09 NOTE — Progress Notes (Signed)
* [facility]  69 year old female with new diagnosis of small cell lung cancer, subsequently intubated due to right lung atelectasis and respiratory failure, because of the lung cancer. She is now status post 2 cycles of chemotherapy to see if this tumor can be shrunk. S/p trach s/p PEG  difficult to wean from vent   Assessment and Plan:  Prolonged VDRF -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, chemotherapy; Significant improvement in airway patency by bronchoscopy 11/22. -Continue vent weaning, this and has been weaned to 35% trach collar. -placed back on full vent support due to acidosis and hypoxia-will attempt to wean to PS mode -will attempt to wean off fentanyl infusion   Right lung, small cell lung cancer. -Status post 2 cycles of chemotherapy  -s/p PEG 07/21/2015 -rad/onc willing to do palliative radiation once completely liberated from the vent.   COPD  -Cont nebulized steroids, does not appear to be in exacerbation at this time  -Cont nebulized bronchodilators.  PSVT  -Cont scheduled enteral metoprolol  Anemia/leukopenia/thrombocytopenia - pancytopenia -We'll continue to monitor CBC, transfuse as needed.  Agitated, delirium. Severe debility and deconditioning - now improving -Likely some degree of sundowning, continue benzodiazepines, including, Klonopin, and when necessary  Versed as needed   Hypernatremia -Improved after stopping Lasix, and continuing with free water replacement. We'll continue to monitor. - s/p PEG placement, continue  Free H2O flushes.  Volume overload - resolved, will monitor. The patient is now off Lasix.   Follow up palliative care consult  Monitor BMET intermittently,PT/OT Monitor I/Os DVT px: SCDs Monitor CBC intermittently Transfuse per usual guidelines   Date: 07/09/2015  MRN# [id number] CHELE CORNELL [birth year]   Juliauna Stueve Vanderschaaf is a 69 y.o. old female seen in follow up for chief complaint of  Chief  Complaint  Patient presents with  . Respiratory Distress     HPI/ROS: Currently on AC mode, will cotninue vent weaning as tolerated,sedation as tolerated unable to provide ROS Will obtain ABG as needed  Allergies:  Atorvastatin and Rosuvastatin  Review of Systems  Unable to perform ROS: acuity of condition     Physical Examination:   VS: BP 96/51 mmHg  Pulse 84  Temp(Src) 99.3 F (37.4 C) (Oral)  Resp 26  Ht '5\' 7"'$  (1.702 m)  Wt 207 lb 10.8 oz (94.2 kg)  BMI 32.52 kg/m2  SpO2 98%  General Appearance: lethargic,somnelent,  Neuro:without focal findings,confused, agitated HEENT: PERRLA, EOM intact. Pulmonary: coarse upper airway sounds, upper airway congestion,  CardiovascularNormal S1,S2.  No m/r/g.   Abdomen: Benign, Soft, mild tender, LUQ PEG in place.  Renal:  No costovertebral tenderness  GU:  Not performed at this time. Endoc: No evident thyromegaly, no signs of acromegaly. Skin:   warm, no rash. Extremities: normal, no cyanosis, clubbing.   LABORATORY PANEL:   CBC  Recent Labs Lab 07/07/15 0543 07/08/15 0746  WBC 2.0*  --   HGB 5.6* 7.2*  HCT 17.2* 22.1*  PLT 37*  --    ------------------------------------------------------------------------------------------------------------------  Chemistries   Recent Labs Lab 07/09/15 0500  NA 146*  K 3.7  CL 114*  CO2 26  GLUCOSE 118*  BUN 56*  CREATININE 0.85  CALCIUM 8.8*  MG 1.8   ------------------------------------------------------------------------------------------------------------------    The Patient requires high complexity decision making for assessment and support, frequent evaluation and titration of therapies, application of advanced monitoring technologies and extensive interpretation of multiple databases. Critical Care Time devoted to patient care services described  in this note is 35  minutes.   Overall, patient is critically ill, prognosis is guarded.   Corrin Parker,  M.D.  [facility]  Medical Director [place] Director [facility] Cardio-Pulmonary Department

## 2015-07-09 NOTE — Progress Notes (Signed)
[facility] Progress Note Patient Name: Emily Pratt DOB: 04-14-1946 MRN: [id number]   Date of Service  07/09/2015  HPI/Events of Note  Low phos  eICU Interventions  Replaced with Kphos, 24mol     Intervention Category Intermediate Interventions: Electrolyte abnormality - evaluation and management  Mamoudou Mulvehill 07/09/2015, 6:47 AM

## 2015-07-09 NOTE — Progress Notes (Signed)
1200 gastric residual 0 mL.

## 2015-07-09 NOTE — Progress Notes (Signed)
Pharmacy Consult for Constipation Prevention   Allergies  Allergen Reactions  . Atorvastatin Other (See Comments)    Does not remember why she had Intolerance to Lipitor.  . Rosuvastatin Anxiety    Chest tigtness and feeling as if she had heartburn.    Patient Measurements: Height: '5\' 7"'$  (170.2 cm) Weight: 207 lb 10.8 oz (94.2 kg) IBW/kg (Calculated) : 61.6   Vital Signs: Temp: 99.3 F (37.4 C) (12/14 0600) Temp Source: Oral (12/14 0600) BP: 117/51 mmHg (12/14 1103) Pulse Rate: 97 (12/14 1103) Intake/Output from previous day: 12/13 0701 - 12/14 0700 In: 3427.8 [I.V.:377.8; NG/GT:3000; IV Piggyback:50] Out: 4098 [Urine:1190] Intake/Output from this shift: Total I/O In: 608.8 [I.V.:42.2; NG/GT:60; IV Piggyback:506.7] Out: -   Labs:  Recent Labs  07/07/15 0543 07/08/15 0613 07/08/15 0746 07/09/15 0500 07/09/15 0932  WBC 2.0*  --   --   --  7.6  HGB 5.6*  --  7.2*  --  6.3*  HCT 17.2*  --  22.1*  --  19.4*  PLT 37*  --   --   --  59*  CREATININE 1.02* 0.89  --  0.85  --   MG 2.0 1.6*  --  1.8  --   PHOS 3.5 2.5  --  1.5*  --    Estimated Creatinine Clearance: 75.6 mL/min (by C-G formula based on Cr of 0.85).   Microbiology: Recent Results (from the past 720 hour(s))  Culture, blood (routine x 2)     Status: None   Collection Time: 06/12/15 11:54 AM  Result Value Ref Range Status   Specimen Description BLOOD LEFT ASSIST CONTROL  Final   Special Requests BOTTLES DRAWN AEROBIC AND ANAEROBIC  3CC  Final   Culture NO GROWTH 6 DAYS  Final   Report Status 06/18/2015 FINAL  Final  Culture, blood (routine x 2)     Status: None   Collection Time: 06/12/15 11:55 AM  Result Value Ref Range Status   Specimen Description BLOOD LEFT FA  Final   Special Requests   Final    BOTTLES DRAWN AEROBIC AND ANAEROBIC 2CC ANAERO 3CC AERO   Culture NO GROWTH 6 DAYS  Final   Report Status 06/18/2015 FINAL  Final  Urine culture     Status: None   Collection Time: 06/12/15 11:55  AM  Result Value Ref Range Status   Specimen Description URINE, CATHETERIZED  Final   Special Requests Immunocompromised  Final   Culture 10,000 COLONIES/mL CANDIDA ALBICANS  Final   Report Status 06/15/2015 FINAL  Final  Culture, expectorated sputum-assessment     Status: None   Collection Time: 06/12/15  4:44 PM  Result Value Ref Range Status   Specimen Description TRACHEAL ASPIRATE  Final   Special Requests Immunocompromised  Final   Sputum evaluation THIS SPECIMEN IS ACCEPTABLE FOR SPUTUM CULTURE  Final   Report Status 06/12/2015 FINAL  Final  Culture, respiratory (NON-Expectorated)     Status: None   Collection Time: 06/12/15  4:44 PM  Result Value Ref Range Status   Specimen Description TRACHEAL ASPIRATE  Final   Special Requests Immunocompromised Reflexed from [id number]  Final   Gram Stain   Final    GOOD SPECIMEN - 80-90% WBCS MODERATE WBC SEEN MODERATE GRAM VARIABLE ROD FEW GRAM POSITIVE COCCI IN PAIRS MANY GRAM POSITIVE RODS    Culture Consistent with normal respiratory flora.  Final   Report Status 06/15/2015 FINAL  Final  Culture, expectorated sputum-assessment     Status: None  Collection Time: 06/14/15  5:19 PM  Result Value Ref Range Status   Specimen Description SPUTUM  Final   Special Requests NONE  Final   Sputum evaluation THIS SPECIMEN IS ACCEPTABLE FOR SPUTUM CULTURE  Final   Report Status 06/14/2015 FINAL  Final  Culture, respiratory (NON-Expectorated)     Status: None   Collection Time: 06/14/15  5:19 PM  Result Value Ref Range Status   Specimen Description SPUTUM  Final   Special Requests NONE Reflexed from [id number]  Final   Gram Stain   Final    FAIR SPECIMEN - 70-80% WBCS MODERATE WBC SEEN MANY GRAM POSITIVE RODS FEW GRAM POSITIVE COCCI IN PAIRS RARE GRAM NEGATIVE RODS    Culture Consistent with normal respiratory flora.  Final   Report Status 06/07/2015 FINAL  Final  Culture, blood (routine x 2)     Status: None   Collection Time: 06/18/15 11:39  AM  Result Value Ref Range Status   Specimen Description BLOOD LEFT HAND  Final   Special Requests   Final    BOTTLES DRAWN AEROBIC AND ANAEROBIC ANAERO [place] AERO 3CC   Culture NO GROWTH 5 DAYS  Final   Report Status 06/23/2015 FINAL  Final  Culture, blood (routine x 2)     Status: None   Collection Time: 06/18/15 12:10 PM  Result Value Ref Range Status   Specimen Description BLOOD  FLUID IN BODY  Final   Special Requests   Final    BOTTLES DRAWN AEROBIC AND ANAEROBIC AERO 1CC ANAERO    Culture NO GROWTH 5 DAYS  Final   Report Status 06/23/2015 FINAL  Final  Culture, respiratory (NON-Expectorated)     Status: None   Collection Time: 06/18/15 12:20 PM  Result Value Ref Range Status   Specimen Description TRACHEAL ASPIRATE  Final   Special Requests NONE  Final   Gram Stain   Final    MANY WBC SEEN MODERATE GRAM VARIABLE ROD EXCELLENT SPECIMEN - 90-100% WBCS    Culture Consistent with normal respiratory flora.  Final   Report Status 06/20/2015 FINAL  Final  Urine culture     Status: None   Collection Time: 06/18/15  2:30 PM  Result Value Ref Range Status   Specimen Description URINE, CATHETERIZED  Final   Special Requests NONE  Final   Culture INSIGNIFICANT GROWTH  Final   Report Status 06/20/2015 FINAL  Final  Culture, bal-quantitative     Status: None   Collection Time: 06/22/15 11:10 AM  Result Value Ref Range Status   Specimen Description SPUTUM  CYTO LAVA  Final   Special Requests Immunocompromised  Final   Gram Stain   Final    GOOD SPECIMEN - 80-90% WBCS MODERATE WBC SEEN FEW GRAM POSITIVE RODS RARE GRAM POSITIVE COCCI IN PAIRS    Culture Consistent with normal respiratory flora.  Final   Report Status 06/24/2015 FINAL  Final  Fungus Culture with Smear     Status: None (Preliminary result)   Collection Time: 06/22/15 11:10 AM  Result Value Ref Range Status   Specimen Description CYTO LAVA  Final   Special Requests Immunocompromised  Final   Culture NO FUNGUS  ISOLATED AFTER 16 DAYS  Final   Report Status PENDING  Incomplete  C difficile quick scan w PCR reflex     Status: None   Collection Time: 06/25/15  2:26 PM  Result Value Ref Range Status   C Diff antigen NEGATIVE NEGATIVE Final   C Diff  toxin NEGATIVE NEGATIVE Final   C Diff interpretation Negative for C. difficile  Final    Medical History: Past Medical History  Diagnosis Date  . Anxiety   . Arthritis   . COPD (chronic obstructive pulmonary disease) ([place])   . CHF (congestive heart failure) ([place])   . Hyperlipidemia   . Hypertension   . Diabetes mellitus without complication ([place])   . Chronic kidney disease   . Neuromuscular disorder (HCC)     Medications:  Scheduled:  . antiseptic oral rinse  7 mL Mouth Rinse QID  . budesonide (PULMICORT) nebulizer solution  0.5 mg Nebulization BID  . chlorhexidine gluconate  15 mL Mouth Rinse BID  . clonazePAM  0.5 mg Per Tube BID  . collagenase   Topical Daily  . escitalopram  5 mg Per Tube Daily  . famotidine  20 mg Per Tube BID  . feeding supplement (PRO-STAT SUGAR FREE 64)  30 mL Per Tube BID  . filgrastim  300 mcg Subcutaneous Daily  . free water  350 mL Per Tube 4 times per day  . insulin aspart  0-20 Units Subcutaneous 6 times per day  . insulin glargine  20 Units Subcutaneous Daily  . levalbuterol  0.63 mg Nebulization Q6H  . losartan  50 mg Oral Daily  . metoprolol tartrate  100 mg Per Tube BID  . potassium phosphate IVPB (mmol)  20 mmol Intravenous Once  . QUEtiapine  25 mg Oral QHS  . senna-docusate  2 tablet Oral BID  . sodium chloride  10-40 mL Intracatheter Q12H   Infusions:  . feeding supplement (VITAL HIGH PROTEIN) 1,000 mL (07/09/15 1121)  . fentaNYL infusion INTRAVENOUS 100 mcg/hr (07/09/15 1117)  . 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  Plan:  Patient still w/o BM since 12/10 so will  change senna-docusate to 2 tab po bid scheduled and f/u am.   Ulice Dash D 07/09/2015,11:24 AM

## 2015-07-09 NOTE — Progress Notes (Signed)
Gastric residual 0 ml.

## 2015-07-10 LAB — TYPE AND SCREEN
ABO/RH(D): O POS
Antibody Screen: NEGATIVE
UNIT DIVISION: 0
Unit division: 0

## 2015-07-10 LAB — BASIC METABOLIC PANEL
ANION GAP: 5 (ref 5–15)
BUN: 48 mg/dL — ABNORMAL HIGH (ref 6–20)
CHLORIDE: 109 mmol/L (ref 101–111)
CO2: 27 mmol/L (ref 22–32)
CREATININE: 0.71 mg/dL (ref 0.44–1.00)
Calcium: 8.6 mg/dL — ABNORMAL LOW (ref 8.9–10.3)
GFR calc Af Amer: >60 mL/min (ref >60)
Glucose, Bld: 130 mg/dL — ABNORMAL HIGH (ref 65–99)
POTASSIUM: 3.7 mmol/L (ref 3.5–5.1)
Sodium: 141 mmol/L (ref 135–145)

## 2015-07-10 LAB — CBC
HCT: 25.5 % — ABNORMAL LOW (ref 35.0–47.0)
Hemoglobin: 8.2 g/dL — ABNORMAL LOW (ref 12.0–16.0)
MCH: 29.4 pg (ref 26.0–34.0)
MCHC: 32 g/dL (ref 32.0–36.0)
MCV: 91.7 fL (ref 80.0–100.0)
PLATELETS: 72 10*3/uL — AB (ref 150–440)
RBC: 2.78 MIL/uL — ABNORMAL LOW (ref 3.80–5.20)
RDW: 19.1 % — AB (ref 11.5–14.5)
WBC: 15 10*3/uL — AB (ref 3.6–11.0)

## 2015-07-10 LAB — GLUCOSE, CAPILLARY
Glucose-Capillary: 109 mg/dL — ABNORMAL HIGH (ref 65–99)
Glucose-Capillary: 132 mg/dL — ABNORMAL HIGH (ref 65–99)
Glucose-Capillary: 145 mg/dL — ABNORMAL HIGH (ref 65–99)
Glucose-Capillary: 155 mg/dL — ABNORMAL HIGH (ref 65–99)
Glucose-Capillary: 162 mg/dL — ABNORMAL HIGH (ref 65–99)

## 2015-07-10 LAB — MAGNESIUM: MAGNESIUM: 1.5 mg/dL — AB (ref 1.7–2.4)

## 2015-07-10 LAB — PHOSPHORUS: PHOSPHORUS: 2.4 mg/dL — AB (ref 2.5–4.6)

## 2015-07-10 MED ORDER — MAGNESIUM SULFATE 2 GM/50ML IV SOLN
2.0000 g | Freq: Once | INTRAVENOUS | Status: AC
  Administered 2015-07-10: 2 g via INTRAVENOUS
  Filled 2015-07-10: qty 50

## 2015-07-10 MED ORDER — BISACODYL 10 MG RE SUPP
10.0000 mg | Freq: Once | RECTAL | Status: AC
  Administered 2015-07-10: 10 mg via RECTAL
  Filled 2015-07-10: qty 1

## 2015-07-10 MED ORDER — QUETIAPINE FUMARATE 25 MG PO TABS
25.0000 mg | ORAL_TABLET | Freq: Two times a day (BID) | ORAL | Status: DC
  Administered 2015-07-10 – 2015-07-15 (×11): 25 mg via ORAL
  Filled 2015-07-10 (×11): qty 1

## 2015-07-10 MED ORDER — POTASSIUM PHOSPHATES 15 MMOLE/5ML IV SOLN
10.0000 mmol | Freq: Once | INTRAVENOUS | Status: AC
  Administered 2015-07-10: 10 mmol via INTRAVENOUS
  Filled 2015-07-10: qty 3.33

## 2015-07-10 NOTE — Consult Note (Addendum)
Electrolyte CONSULT NOTE - FOLLOW UP  Pharmacy Consult for Electrolyte monitoring and replacement  Patient Measurements: Height: '5\' 7"'$  (170.2 cm) Weight: 209 lb 3.5 oz (94.9 kg) IBW/kg (Calculated) : 61.6  Vital Signs: Temp: 97 F (36.1 C) (12/15 0400) Temp Source: Axillary (12/15 0400) BP: 96/49 mmHg (12/15 0600) Pulse Rate: 85 (12/15 0600)  Recent Labs  07/08/15 0613 07/09/15 0500 07/10/15 0512  NA 143 146* 141  K 3.5 3.7 3.7  CL 112* 114* 109  CO2 '26 26 27  '$ GLUCOSE 151* 118* 130*  BUN 57* 56* 48*  CREATININE 0.89 0.85 0.71  CALCIUM 8.6* 8.8* 8.6*  MG 1.6* 1.8 1.5*  PHOS 2.5 1.5* 2.4*   Estimated Creatinine Clearance: 80.7 mL/min (by C-G formula based on Cr of 0.71).   Recent Labs  07/09/15 1139 07/09/15 1619 07/09/15 2340  GLUCAP 146* 138* 158*    Assessment: 69 yo female with SCLC current;y intubated in ICU. Pharmacy consulted for monitoring and managing electrolytes.   Phos= 1.5  Plan:  Labs are wnl except for phos which is low and has been replaced with 20 mmol Kphos. Will f/u am labs.  12/15 AM Mg 1.5, PO4 2.4. Replacement electrolytes ordered by MD. Will recheck with tomorrow AM labs.   Ulice Dash, PharmD Clinical Pharmacist  07/10/2015

## 2015-07-10 NOTE — Progress Notes (Signed)
eLink Physician-Brief Progress Note Patient Name: Emily Pratt DOB: 1945-10-18 MRN: [id number]    [facility] Physician Progress Note and Electrolyte Replacement  Patient Name: Emily Pratt DOB: 1946-04-20 MRN: [id number]  Date of Service  07/10/2015   HPI/Events of Note    Recent Labs Lab 07/06/15 0440 07/07/15 0543 07/08/15 0613 07/09/15 0500 07/10/15 0512  NA 149* 145 143 146* 141  K 3.7 3.8 3.5 3.7 3.7  CL 114* 113* 112* 114* 109  CO2 '26 27 26 26 27  '$ GLUCOSE 186* 133* 151* 118* 130*  BUN 52* 58* 57* 56* 48*  CREATININE 0.84 1.02* 0.89 0.85 0.71  CALCIUM 8.8* 8.5* 8.6* 8.8* 8.6*  MG 1.8 2.0 1.6* 1.8 1.5*  PHOS 2.3* 3.5 2.5 1.5* 2.4*    Estimated Creatinine Clearance: 80.7 mL/min (by C-G formula based on Cr of 0.71).  Intake/Output      12/14 0701 - 12/15 0700   I.V. (mL/kg) 245.4 (2.6)   Blood 312   NG/GT 1260   IV Piggyback 506.7   Total Intake(mL/kg) 2324.1 (24.5)   Urine (mL/kg/hr) 1150 (0.5)   Total Output 1150   Net +1174.1        - I/O DETAILED x 24h    Total I/O In: 1082 [I.V.:110; Blood:312; NG/GT:660] Out: 1150 [Urine:1150] - I/O THIS SHIFT    ASSESSMENT Low mag Low phos  eICURN Interventions  replet mag/phos   ASSESSMENT: MAJOR ELECTROLYTE      Dr. Brand Males, M.D., [facility].C.P Pulmonary and Critical Care Medicine Staff Physician [facility] Pager: [phone], If no answer or between  15:00h - 7:00h: call [phone]  07/10/2015 6:30 AM

## 2015-07-10 NOTE — Progress Notes (Signed)
Pharmacy Consult for Constipation Prevention   Allergies  Allergen Reactions  . Atorvastatin Other (See Comments)    Does not remember why she had Intolerance to Lipitor.  . Rosuvastatin Anxiety    Chest tigtness and feeling as if she had heartburn.    Patient Measurements: Height: '5\' 7"'$  (170.2 cm) Weight: 209 lb 3.5 oz (94.9 kg) IBW/kg (Calculated) : 61.6   Vital Signs: Temp: 99 F (37.2 C) (12/15 0727) Temp Source: Oral (12/15 0727) BP: 120/57 mmHg (12/15 1100) Pulse Rate: 90 (12/15 1100) Intake/Output from previous day: 12/14 0701 - 12/15 0700 In: 2384.1 [I.V.:245.4; Blood:312; NG/GT:1320; IV Piggyback:506.7] Out: 1150 [Urine:1150] Intake/Output from this shift: Total I/O In: 498.3 [I.V.:15; NG/GT:180; IV Piggyback:303.3] Out: 350 [Urine:350]  Labs:  Recent Labs  07/08/15 0613 07/08/15 0746 07/09/15 0500 07/09/15 0932 07/10/15 0512  WBC  --   --   --  7.6  --   HGB  --  7.2*  --  6.3*  --   HCT  --  22.1*  --  19.4*  --   PLT  --   --   --  59*  --   CREATININE 0.89  --  0.85  --  0.71  MG 1.6*  --  1.8  --  1.5*  PHOS 2.5  --  1.5*  --  2.4*   Estimated Creatinine Clearance: 80.7 mL/min (by C-G formula based on Cr of 0.71).   Microbiology: Recent Results (from the past 720 hour(s))  Culture, blood (routine x 2)     Status: None   Collection Time: 06/12/15 11:54 AM  Result Value Ref Range Status   Specimen Description BLOOD LEFT ASSIST CONTROL  Final   Special Requests BOTTLES DRAWN AEROBIC AND ANAEROBIC  3CC  Final   Culture NO GROWTH 6 DAYS  Final   Report Status 06/18/2015 FINAL  Final  Culture, blood (routine x 2)     Status: None   Collection Time: 06/12/15 11:55 AM  Result Value Ref Range Status   Specimen Description BLOOD LEFT FA  Final   Special Requests   Final    BOTTLES DRAWN AEROBIC AND ANAEROBIC 2CC ANAERO 3CC AERO   Culture NO GROWTH 6 DAYS  Final   Report Status 06/18/2015 FINAL  Final  Urine culture     Status: None   Collection Time: 06/12/15 11:55 AM  Result Value Ref Range Status   Specimen Description URINE, CATHETERIZED  Final   Special Requests Immunocompromised  Final   Culture 10,000 COLONIES/mL CANDIDA ALBICANS  Final   Report Status 06/15/2015 FINAL  Final  Culture, expectorated sputum-assessment     Status: None   Collection Time: 06/12/15  4:44 PM  Result Value Ref Range Status   Specimen Description TRACHEAL ASPIRATE  Final   Special Requests Immunocompromised  Final   Sputum evaluation THIS SPECIMEN IS ACCEPTABLE FOR SPUTUM CULTURE  Final   Report Status 06/12/2015 FINAL  Final  Culture, respiratory (NON-Expectorated)     Status: None   Collection Time: 06/12/15  4:44 PM  Result Value Ref Range Status   Specimen Description TRACHEAL ASPIRATE  Final   Special Requests Immunocompromised Reflexed from [id number]  Final   Gram Stain   Final    GOOD SPECIMEN - 80-90% WBCS MODERATE WBC SEEN MODERATE GRAM VARIABLE ROD FEW GRAM POSITIVE COCCI IN PAIRS MANY GRAM POSITIVE RODS    Culture Consistent with normal respiratory flora.  Final   Report Status 06/15/2015 FINAL  Final  Culture,  expectorated sputum-assessment     Status: None   Collection Time: 06/14/15  5:19 PM  Result Value Ref Range Status   Specimen Description SPUTUM  Final   Special Requests NONE  Final   Sputum evaluation THIS SPECIMEN IS ACCEPTABLE FOR SPUTUM CULTURE  Final   Report Status 06/14/2015 FINAL  Final  Culture, respiratory (NON-Expectorated)     Status: None   Collection Time: 06/14/15  5:19 PM  Result Value Ref Range Status   Specimen Description SPUTUM  Final   Special Requests NONE Reflexed from [id number]  Final   Gram Stain   Final    FAIR SPECIMEN - 70-80% WBCS MODERATE WBC SEEN MANY GRAM POSITIVE RODS FEW GRAM POSITIVE COCCI IN PAIRS RARE GRAM NEGATIVE RODS    Culture Consistent with normal respiratory flora.  Final   Report Status 06/02/2015 FINAL  Final  Culture, blood (routine x 2)     Status: None    Collection Time: 06/18/15 11:39 AM  Result Value Ref Range Status   Specimen Description BLOOD LEFT HAND  Final   Special Requests   Final    BOTTLES DRAWN AEROBIC AND ANAEROBIC ANAERO [place] AERO 3CC   Culture NO GROWTH 5 DAYS  Final   Report Status 06/23/2015 FINAL  Final  Culture, blood (routine x 2)     Status: None   Collection Time: 06/18/15 12:10 PM  Result Value Ref Range Status   Specimen Description BLOOD  FLUID IN BODY  Final   Special Requests   Final    BOTTLES DRAWN AEROBIC AND ANAEROBIC AERO 1CC ANAERO    Culture NO GROWTH 5 DAYS  Final   Report Status 06/23/2015 FINAL  Final  Culture, respiratory (NON-Expectorated)     Status: None   Collection Time: 06/18/15 12:20 PM  Result Value Ref Range Status   Specimen Description TRACHEAL ASPIRATE  Final   Special Requests NONE  Final   Gram Stain   Final    MANY WBC SEEN MODERATE GRAM VARIABLE ROD EXCELLENT SPECIMEN - 90-100% WBCS    Culture Consistent with normal respiratory flora.  Final   Report Status 06/20/2015 FINAL  Final  Urine culture     Status: None   Collection Time: 06/18/15  2:30 PM  Result Value Ref Range Status   Specimen Description URINE, CATHETERIZED  Final   Special Requests NONE  Final   Culture INSIGNIFICANT GROWTH  Final   Report Status 06/20/2015 FINAL  Final  Culture, bal-quantitative     Status: None   Collection Time: 06/22/15 11:10 AM  Result Value Ref Range Status   Specimen Description SPUTUM  CYTO LAVA  Final   Special Requests Immunocompromised  Final   Gram Stain   Final    GOOD SPECIMEN - 80-90% WBCS MODERATE WBC SEEN FEW GRAM POSITIVE RODS RARE GRAM POSITIVE COCCI IN PAIRS    Culture Consistent with normal respiratory flora.  Final   Report Status 06/24/2015 FINAL  Final  Fungus Culture with Smear     Status: None (Preliminary result)   Collection Time: 06/22/15 11:10 AM  Result Value Ref Range Status   Specimen Description CYTO LAVA  Final   Special Requests Immunocompromised   Final   Culture NO FUNGUS ISOLATED AFTER 18 DAYS  Final   Report Status PENDING  Incomplete  C difficile quick scan w PCR reflex     Status: None   Collection Time: 06/25/15  2:26 PM  Result Value Ref Range Status  C Diff antigen NEGATIVE NEGATIVE Final   C Diff toxin NEGATIVE NEGATIVE Final   C Diff interpretation Negative for C. difficile  Final    Medical History: Past Medical History  Diagnosis Date  . Anxiety   . Arthritis   . COPD (chronic obstructive pulmonary disease) ([place])   . CHF (congestive heart failure) ([place])   . Hyperlipidemia   . Hypertension   . Diabetes mellitus without complication ([place])   . Chronic kidney disease   . Neuromuscular disorder (H[facility])     Medications:  Scheduled:  . antiseptic oral rinse  7 mL Mouth Rinse QID  . budesonide (PULMICORT) nebulizer solution  0.5 mg Nebulization BID  . chlorhexidine gluconate  15 mL Mouth Rinse BID  . clonazePAM  0.5 mg Per Tube BID  . collagenase   Topical Daily  . escitalopram  5 mg Per Tube Daily  . famotidine  20 mg Per Tube BID  . feeding supplement (PRO-STAT SUGAR FREE 64)  30 mL Per Tube BID  . free water  350 mL Per Tube 4 times per day  . insulin aspart  0-20 Units Subcutaneous 6 times per day  . insulin glargine  20 Units Subcutaneous Daily  . levalbuterol  0.63 mg Nebulization Q6H  . losartan  50 mg Oral Daily  . metoprolol tartrate  100 mg Per Tube BID  . potassium phosphate IVPB (mmol)  10 mmol Intravenous Once  . QUEtiapine  25 mg Oral BID  . senna-docusate  2 tablet Oral BID  . sodium chloride  10-40 mL Intracatheter Q12H   Infusions:  . feeding supplement (VITAL HIGH PROTEIN) 1,000 mL (07/10/15 0600)  . fentaNYL infusion INTRAVENOUS Stopped (07/10/15 0730)  . 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  Plan:  Patient still w/o BM since 12/10 so will continue  senna-docusate to 2 tab po bid scheduled. As per discussion with team on rounds, will try bisacodyl suppository x 1 and f/u AM.   Ulice Dash D 07/10/2015,11:34 AM

## 2015-07-10 NOTE — Care Management (Signed)
Hdecreasedeart rate has been elevated and 02 sats decreased during same time. Most likely due to her agitation and reaction to vent  setting.   Requiring additional administration of ativan, morphine and geodon.  Plan to wean to pressure support mode today.   Palliative care attempting to reach out to patient's husband to discuss goals of treatment as prognosis is very guarded.  Patient is due to have third round of chemo around 12/20 if medically stable to receive.

## 2015-07-10 NOTE — Progress Notes (Signed)
* [facility]  69 year old female with new diagnosis of small cell lung cancer, subsequently intubated due to right lung atelectasis and respiratory failure, because of the lung cancer. She is now status post 2 cycles of chemotherapy to see if this tumor can be shrunk. S/p trach s/p PEG  difficult to wean from vent prognosis is poor.    Assessment and Plan:  Prolonged VDRF -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, chemotherapy; Significant improvement in airway patency by bronchoscopy 11/22. -Continue vent weaning, this and has been weaned to 35% trach collar. -placed back on full vent support due to acidosis and hypoxia-will attempt to wean to PS mode Off fentanyl infusion, willuse PRN meds Plan for PS mode today   Right lung, small cell lung cancer. -Status post 2 cycles of chemotherapy  -s/p PEG 07/22/2015 -rad/onc willing to do palliative radiation once completely liberated from the vent.   COPD  -Cont nebulized steroids, does not appear to be in exacerbation at this time  -Cont nebulized bronchodilators.  PSVT  -Cont scheduled enteral metoprolol  Anemia/leukopenia/thrombocytopenia - pancytopenia -We'll continue to monitor CBC, transfuse as needed. S/p 1 unit yesterday, will follow h/h  Agitated, delirium. Severe debility and deconditioning - now improving -Likely some degree of sundowning, continue benzodiazepines, including, Klonopin, and when necessary  Versed as needed   Hypernatremia -Improved after stopping Lasix, and continuing with free water replacement. We'll continue to monitor. - s/p PEG placement, continue  Free H2O flushes.  Volume overload - resolved, will monitor. The patient is now off Lasix.   Follow up palliative care consult  Monitor BMET intermittently,PT/OT Monitor I/Os DVT px: SCDs Monitor CBC intermittently Transfuse per usual guidelines   Date: 07/10/2015  MRN# [id number] Emily Pratt 12-18-1945   Emily Pratt  is a 69 y.o. old female seen in follow up for chief complaint of  Chief Complaint  Patient presents with  . Respiratory Distress     HPI/ROS: Currently on AC mode, will cotninue vent weaning as tolerated,sedation as tolerated unable to provide ROS Will obtain ABG as needed  Allergies:  Atorvastatin and Rosuvastatin  Review of Systems  Unable to perform ROS: acuity of condition     Physical Examination:   VS: BP 137/65 mmHg  Pulse 86  Temp(Src) 99 F (37.2 C) (Oral)  Resp 26  Ht '5\' 7"'$  (1.702 m)  Wt 209 lb 3.5 oz (94.9 kg)  BMI 32.76 kg/m2  SpO2 94%  General Appearance: lethargic,somnelent,  Neuro:without focal findings,confused, agitated HEENT: PERRLA, EOM intact. Pulmonary: coarse upper airway sounds, upper airway congestion,  CardiovascularNormal S1,S2.  No m/r/g.   Abdomen: Benign, Soft, mild tender, LUQ PEG in place.  Renal:  No costovertebral tenderness  GU:  Not performed at this time. Endoc: No evident thyromegaly, no signs of acromegaly. Skin:   warm, no rash. Extremities: normal, no cyanosis, clubbing.   LABORATORY PANEL:   CBC  Recent Labs Lab 07/09/15 0932  WBC 7.6  HGB 6.3*  HCT 19.4*  PLT 59*   ------------------------------------------------------------------------------------------------------------------  Chemistries   Recent Labs Lab 07/10/15 0512  NA 141  K 3.7  CL 109  CO2 27  GLUCOSE 130*  BUN 48*  CREATININE 0.71  CALCIUM 8.6*  MG 1.5*   ------------------------------------------------------------------------------------------------------------------    The Patient requires high complexity decision making for assessment and support, frequent evaluation and titration of therapies, application of advanced monitoring technologies and extensive interpretation of multiple databases. Critical Care Time devoted  to patient care services described in this note is 35  minutes.   Overall, patient is critically ill, prognosis is  guarded.   Corrin Parker, M.D.  [facility]  Medical Director [place] Director [facility] Cardio-Pulmonary Department

## 2015-07-10 NOTE — Progress Notes (Signed)
PT Cancellation Note  Patient Details Name: Emily Pratt MRN: [id number] DOB: 1946/07/24   Cancelled Treatment:    Reason Eval/Treat Not Completed: Fatigue/lethargy limiting ability to participate (Treatment session attempted.  Patient sleeping upon arrival; opens eyes briefly with stimulation from therapist, but quickly closes and turns head away from therapist.  Unable to maintain alertness for participation with session.  Will re-attempt next date as appropriate.)  Detria Cummings H. Owens Shark, PT, DPT, NCS 07/10/2015, 2:39 PM [phone]

## 2015-07-10 NOTE — Progress Notes (Signed)
Nutrition Follow-up    INTERVENTION:   EN: recommend continuing current TF regimen as tolerated by pt; recommend discontinuing TF residual checks (received verbal order from MD Kasa to discontinue) as residuals have been 0 mL,no signs of TF intolerance, residual checks have been removed from ASPEN guidelines Nutrition related medication management: recommend further intervention regarding bowel regimen as pt without BM for 5 days; recommend enema or suppository   NUTRITION DIAGNOSIS:   Inadequate oral intake related to acute illness as evidenced by NPO status.  GOAL:   Patient will meet greater than or equal to 90% of their needs  MONITOR:    (Energy Intake, Anthropometrics, Electrolyte/Renal Profile, Digestive System, Electrolyte/Renal Profile)  REASON FOR ASSESSMENT:   Consult Enteral/tube feeding initiation and management  ASSESSMENT:    Pt agitated this AM; pt with delerium; vent via trach with weaning trials as tolerated, nutrition via PEG tube, magnesium and phosphorus supplemented this AM  Diet Order:  Diet NPO time specified   EN: tolerating Vital High Protein at rate of 60 ml/hr, Prostat BID, 350 mL free water q 6 hours  Digestive System: residual checks 0 mL consistently, no signs of TF intolerance, no BM since 12/10  Skin: stage III pressure ulcer  Electrolyte and Renal Profile:  Recent Labs Lab 07/08/15 0613 07/09/15 0500 07/10/15 0512  BUN 57* 56* 48*  CREATININE 0.89 0.85 0.71  NA 143 146* 141  K 3.5 3.7 3.7  MG 1.6* 1.8 1.5*  PHOS 2.5 1.5* 2.4*   Glucose Profile:  Recent Labs  07/09/15 1619 07/09/15 2340 07/10/15 0719  GLUCAP 138* 158* 109*   Meds: senna-docustate BID, miralax prn, ss novolog, lantus,   Height:   Ht Readings from Last 1 Encounters:  05/12/2015 '5\' 7"'$  (1.702 m)    Weight:   Wt Readings from Last 1 Encounters:  07/10/15 209 lb 3.5 oz (94.9 kg)    BMI:  Body mass index is 32.76 kg/(m^2).  Estimated Nutritional  Needs:   Kcal:  1254-1596kcals, (11-14kcals/kg) using current weight of 114kg)  Protein:  122-153 g (2.0-2.5 g/kg IBW) or 140-176 g (1.2-1.5 g/kg current wt)  Fluid:  1535-1853m of fluid (25-356mkg)  EDUCATION NEEDS:   No education needs identified at this time  HICitrus ParkRD, LDN ([phone]

## 2015-07-10 NOTE — Progress Notes (Signed)
Assumed care of patient at [place] from [place], [place].  Gastric residual at midnight 12m.  Gastric residual at 0400 014m Pt rested comfortably after given geodon at 2240 for increased agitation.  Fentanyl remains at 10076mhr.  Sitter at bedside.

## 2015-07-11 LAB — CBC
HEMATOCRIT: 26 % — AB (ref 35.0–47.0)
HEMOGLOBIN: 8.1 g/dL — AB (ref 12.0–16.0)
MCH: 29.1 pg (ref 26.0–34.0)
MCHC: 31.2 g/dL — ABNORMAL LOW (ref 32.0–36.0)
MCV: 93.4 fL (ref 80.0–100.0)
Platelets: 86 10*3/uL — ABNORMAL LOW (ref 150–440)
RBC: 2.78 MIL/uL — AB (ref 3.80–5.20)
RDW: 18.8 % — ABNORMAL HIGH (ref 11.5–14.5)
WBC: 7.5 10*3/uL (ref 3.6–11.0)

## 2015-07-11 LAB — GLUCOSE, CAPILLARY
Glucose-Capillary: 144 mg/dL — ABNORMAL HIGH (ref 65–99)
Glucose-Capillary: 148 mg/dL — ABNORMAL HIGH (ref 65–99)
Glucose-Capillary: 140 mg/dL — ABNORMAL HIGH (ref 65–99)
Glucose-Capillary: 123 mg/dL — ABNORMAL HIGH (ref 65–99)
Glucose-Capillary: 137 mg/dL — ABNORMAL HIGH (ref 65–99)
Glucose-Capillary: 129 mg/dL — ABNORMAL HIGH (ref 65–99)
Glucose-Capillary: 141 mg/dL — ABNORMAL HIGH (ref 65–99)
Glucose-Capillary: 147 mg/dL — ABNORMAL HIGH (ref 65–99)
Glucose-Capillary: 131 mg/dL — ABNORMAL HIGH (ref 65–99)

## 2015-07-11 LAB — BASIC METABOLIC PANEL
Anion gap: 4 — ABNORMAL LOW (ref 5–15)
BUN: 49 mg/dL — AB (ref 6–20)
CHLORIDE: 111 mmol/L (ref 101–111)
CO2: 29 mmol/L (ref 22–32)
Calcium: 8.9 mg/dL (ref 8.9–10.3)
Creatinine, Ser: 0.77 mg/dL (ref 0.44–1.00)
GFR calc Af Amer: >60 mL/min (ref >60)
GFR calc non Af Amer: >60 mL/min (ref >60)
GLUCOSE: 150 mg/dL — AB (ref 65–99)
POTASSIUM: 3.7 mmol/L (ref 3.5–5.1)
Sodium: 144 mmol/L (ref 135–145)

## 2015-07-11 LAB — MAGNESIUM: MAGNESIUM: 1.7 mg/dL (ref 1.7–2.4)

## 2015-07-11 LAB — PHOSPHORUS: Phosphorus: 3 mg/dL (ref 2.5–4.6)

## 2015-07-11 MED ORDER — SENNOSIDES-DOCUSATE SODIUM 8.6-50 MG PO TABS
1.0000 | ORAL_TABLET | Freq: Two times a day (BID) | ORAL | Status: DC
  Administered 2015-07-11 – 2015-07-15 (×8): 1 via ORAL
  Filled 2015-07-11 (×8): qty 1

## 2015-07-11 NOTE — Consult Note (Signed)
Electrolyte CONSULT NOTE - FOLLOW UP  Pharmacy Consult for Electrolyte monitoring and replacement  Patient Measurements: Height: '5\' 7"'$  (170.2 cm) Weight: 208 lb 15.9 oz (94.8 kg) IBW/kg (Calculated) : 61.6  Vital Signs: Temp: 99.1 F (37.3 C) (12/16 0000) Temp Source: Axillary (12/16 0000) BP: 125/65 mmHg (12/16 0400) Pulse Rate: 91 (12/16 0400)  Recent Labs  07/09/15 0500 07/10/15 0512 07/11/15 0433  NA 146* 141 144  K 3.7 3.7 3.7  CL 114* 109 111  CO2 '26 27 29  '$ GLUCOSE 118* 130* 150*  BUN 56* 48* 49*  CREATININE 0.85 0.71 0.77  CALCIUM 8.8* 8.6* 8.9  MG 1.8 1.5* 1.7  PHOS 1.5* 2.4* 3.0   Estimated Creatinine Clearance: 80.7 mL/min (by C-G formula based on Cr of 0.77).   Recent Labs  07/10/15 1933 07/10/15 2330 07/11/15 0323  GLUCAP 162* 132* 137*    Assessment: 69 yo female with SCLC current;y intubated in ICU. Pharmacy consulted for monitoring and managing electrolytes.   Phos= 1.5  Plan:  Labs are wnl except for phos which is low and has been replaced with 20 mmol Kphos. Will f/u am labs.  12/15 AM Mg 1.5, PO4 2.4. Replacement electrolytes ordered by MD. Will recheck with tomorrow AM labs.   12/16 AM electrolytes WNL. Recheck BMP in AM.  Sim Boast, PharmD, BCPS  07/11/2015

## 2015-07-11 NOTE — Progress Notes (Signed)
* [facility]  69 year old female with new diagnosis of small cell lung cancer, subsequently intubated due to right lung atelectasis and respiratory failure, because of the lung cancer. She is now status post 2 cycles of chemotherapy to see if this tumor can be shrunk.  S/p trach s/p PEG  difficult to wean from vent prognosis is poor.    Assessment and Plan:  Prolonged VDRF -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, chemotherapy; Significant improvement in airway patency by bronchoscopy 11/22. -Continue vent weaning, this and has been weaned to 35% trach collar. -placed back on full vent support due to acidosis and hypoxia-will attempt to wean to PS mode Off fentanyl infusion, willuse PRN meds Plan to wean off PS mode right now PS at 15  Right lung, small cell lung cancer. -Status post 2 cycles of chemotherapy  -s/p PEG 07/24/2015 -rad/onc willing to do palliative radiation once completely liberated from the vent.   COPD  -Cont nebulized steroids, does not appear to be in exacerbation at this time  -Cont nebulized bronchodilators.  PSVT  -Cont scheduled enteral metoprolol  Anemia/leukopenia/thrombocytopenia - pancytopenia -We'll continue to monitor CBC, transfuse as needed. S/p 1 unit yesterday, will follow h/h  Agitated, delirium. Severe debility and deconditioning - now improving -Likely some degree of sundowning, continue benzodiazepines, including, Klonopin, and when necessary  Versed as needed   Hypernatremia -Improved after stopping Lasix, and continuing with free water replacement. We'll continue to monitor. - s/p PEG placement, continue  Free H2O flushes.  Volume overload - resolved, will monitor. The patient is now off Lasix.   Follow up palliative care consult  Monitor BMET intermittently,PT/OT Monitor I/Os DVT px: SCDs Monitor CBC intermittently Transfuse per usual guidelines   Date: 07/11/2015  MRN# [id number] Emily Pratt  02/11/1946   Emily Pratt is a 69 y.o. old female seen in follow up for chief complaint of  Chief Complaint  Patient presents with  . Respiratory Distress     HPI/ROS: Currently on AC mode, will cotninue vent weaning as tolerated,sedation as tolerated unable to provide ROS Wean PS as tolerated  Allergies:  Atorvastatin and Rosuvastatin  Review of Systems  Unable to perform ROS: acuity of condition     Physical Examination:   VS: BP 111/56 mmHg  Pulse 83  Temp(Src) 97.4 F (36.3 C) (Oral)  Resp 17  Ht '5\' 7"'$  (1.702 m)  Wt 208 lb 15.9 oz (94.8 kg)  BMI 32.73 kg/m2  SpO2 93%  General Appearance: lethargic,somnelent,  Neuro:without focal findings,confused, more calm today HEENT: PERRLA, EOM intact. Pulmonary: coarse upper airway sounds, upper airway congestion,  CardiovascularNormal S1,S2.  No m/r/g.   Abdomen: Benign, Soft, mild tender, LUQ PEG in place.     LABORATORY PANEL:   CBC  Recent Labs Lab 07/10/15 0512  WBC 15.0*  HGB 8.2*  HCT 25.5*  PLT 72*   ------------------------------------------------------------------------------------------------------------------  Chemistries   Recent Labs Lab 07/11/15 0433  NA 144  K 3.7  CL 111  CO2 29  GLUCOSE 150*  BUN 49*  CREATININE 0.77  CALCIUM 8.9  MG 1.7   ------------------------------------------------------------------------------------------------------------------    The Patient requires high complexity decision making for assessment and support, frequent evaluation and titration of therapies, application of advanced monitoring technologies and extensive interpretation of multiple databases. Critical Care Time devoted to patient care services described in this note is 35  minutes.   Overall, patient is critically ill, prognosis is guarded.  Corrin Parker, M.D.  [facility]  Medical Director [place] Director [facility] Cardio-Pulmonary  Department

## 2015-07-11 NOTE — Care Management (Signed)
Have not had return call from patient's husband to assist with coordination of palliative consult.  Was able to make contact with Emily Pratt this morning at [phone] and he is agreeable to speak with palliative care.   Have set up a meeting with him on Monday  at 12:30.  He will meet care team on the unit.  Updated Dr Megan Salon

## 2015-07-11 NOTE — Progress Notes (Signed)
PT Cancellation Note  Patient Details Name: Emily Pratt MRN: [id number] DOB: 1945-11-03   Cancelled Treatment:    Reason Eval/Treat Not Completed: Medical issues which prohibited therapy (Patient remains sedated/lethargic (due to agitation, HR issues) and unable to participate with PT.  Have consistently attempted treatment session for a week now without success (last effective treatment session 12/7).  Will trial one additional visit; if remains unable to tolerate/participate, will complete initial order and re-consult as medically appropriate.)   Emberlin Verner H. Owens Shark, PT, DPT, NCS 07/11/2015, 11:08 AM [phone]

## 2015-07-11 NOTE — Progress Notes (Signed)
   07/10/15 1500  Clinical Encounter Type  Visited With Patient and family together;Patient not available  Visit Type Other (Comment)  Referral From Family  Consult/Referral To Chaplain  Spiritual Encounters  Spiritual Needs Emotional;Other (Comment)  Stress Factors  Family Stress Factors Other (Comment)  Chaplain rounded in the unit and provided transport for the patient's spouse downstairs to catch his ride. Offered a compassionate presence and support. Chaplain Abdulrahman Bracey A. Sande Pickert Ext. [phone]

## 2015-07-11 NOTE — Progress Notes (Signed)
Palliative Care Update   I will be meeting with pt's husband Monday at 12:30 pm.  He could not come here today but plans on being here Saturday.  I cannot meet him on the weekend.  Care Mgr was able to arrange a meeting for 12:30 Monday.     Colleen Can, MD

## 2015-07-11 NOTE — Progress Notes (Signed)
Pharmacy Consult for Constipation Prevention   Allergies  Allergen Reactions  . Atorvastatin Other (See Comments)    Does not remember why she had Intolerance to Lipitor.  . Rosuvastatin Anxiety    Chest tigtness and feeling as if she had heartburn.    Patient Measurements: Height: '5\' 7"'$  (170.2 cm) Weight: 208 lb 15.9 oz (94.8 kg) IBW/kg (Calculated) : 61.6   Vital Signs: Temp: 97.4 F (36.3 C) (12/16 0800) Temp Source: Oral (12/16 0800) BP: 111/56 mmHg (12/16 1100) Pulse Rate: 83 (12/16 1100) Intake/Output from previous day: 12/15 0701 - 12/16 0700 In: 3633.2 [I.V.:129.8; NG/GT:2990; IV Piggyback:303.3] Out: 1975 [SWNIO:2703] Intake/Output from this shift: Total I/O In: 242.3 [I.V.:2.3; NG/GT:240] Out: 525 [Urine:525]  Labs:  Recent Labs  07/09/15 0500 07/09/15 0932 07/10/15 0512 07/11/15 0433  WBC  --  7.6 15.0*  --   HGB  --  6.3* 8.2*  --   HCT  --  19.4* 25.5*  --   PLT  --  59* 72*  --   CREATININE 0.85  --  0.71 0.77  MG 1.8  --  1.5* 1.7  PHOS 1.5*  --  2.4* 3.0   Estimated Creatinine Clearance: 80.7 mL/min (by C-G formula based on Cr of 0.77).   Microbiology: Recent Results (from the past 720 hour(s))  Culture, blood (routine x 2)     Status: None   Collection Time: 06/12/15 11:54 AM  Result Value Ref Range Status   Specimen Description BLOOD LEFT ASSIST CONTROL  Final   Special Requests BOTTLES DRAWN AEROBIC AND ANAEROBIC  3CC  Final   Culture NO GROWTH 6 DAYS  Final   Report Status 06/18/2015 FINAL  Final  Culture, blood (routine x 2)     Status: None   Collection Time: 06/12/15 11:55 AM  Result Value Ref Range Status   Specimen Description BLOOD LEFT FA  Final   Special Requests   Final    BOTTLES DRAWN AEROBIC AND ANAEROBIC 2CC ANAERO 3CC AERO   Culture NO GROWTH 6 DAYS  Final   Report Status 06/18/2015 FINAL  Final  Urine culture     Status: None   Collection Time: 06/12/15 11:55 AM  Result Value Ref Range Status   Specimen  Description URINE, CATHETERIZED  Final   Special Requests Immunocompromised  Final   Culture 10,000 COLONIES/mL CANDIDA ALBICANS  Final   Report Status 06/15/2015 FINAL  Final  Culture, expectorated sputum-assessment     Status: None   Collection Time: 06/12/15  4:44 PM  Result Value Ref Range Status   Specimen Description TRACHEAL ASPIRATE  Final   Special Requests Immunocompromised  Final   Sputum evaluation THIS SPECIMEN IS ACCEPTABLE FOR SPUTUM CULTURE  Final   Report Status 06/12/2015 FINAL  Final  Culture, respiratory (NON-Expectorated)     Status: None   Collection Time: 06/12/15  4:44 PM  Result Value Ref Range Status   Specimen Description TRACHEAL ASPIRATE  Final   Special Requests Immunocompromised Reflexed from [id number]  Final   Gram Stain   Final    GOOD SPECIMEN - 80-90% WBCS MODERATE WBC SEEN MODERATE GRAM VARIABLE ROD FEW GRAM POSITIVE COCCI IN PAIRS MANY GRAM POSITIVE RODS    Culture Consistent with normal respiratory flora.  Final   Report Status 06/15/2015 FINAL  Final  Culture, expectorated sputum-assessment     Status: None   Collection Time: 06/14/15  5:19 PM  Result Value Ref Range Status   Specimen Description SPUTUM  Final  Special Requests NONE  Final   Sputum evaluation THIS SPECIMEN IS ACCEPTABLE FOR SPUTUM CULTURE  Final   Report Status 06/14/2015 FINAL  Final  Culture, respiratory (NON-Expectorated)     Status: None   Collection Time: 06/14/15  5:19 PM  Result Value Ref Range Status   Specimen Description SPUTUM  Final   Special Requests NONE Reflexed from [id number]  Final   Gram Stain   Final    FAIR SPECIMEN - 70-80% WBCS MODERATE WBC SEEN MANY GRAM POSITIVE RODS FEW GRAM POSITIVE COCCI IN PAIRS RARE GRAM NEGATIVE RODS    Culture Consistent with normal respiratory flora.  Final   Report Status 06/20/2015 FINAL  Final  Culture, blood (routine x 2)     Status: None   Collection Time: 06/18/15 11:39 AM  Result Value Ref Range Status   Specimen  Description BLOOD LEFT HAND  Final   Special Requests   Final    BOTTLES DRAWN AEROBIC AND ANAEROBIC ANAERO [address] AERO 3CC   Culture NO GROWTH 5 DAYS  Final   Report Status 06/23/2015 FINAL  Final  Culture, blood (routine x 2)     Status: None   Collection Time: 06/18/15 12:10 PM  Result Value Ref Range Status   Specimen Description BLOOD  FLUID IN BODY  Final   Special Requests   Final    BOTTLES DRAWN AEROBIC AND ANAEROBIC AERO 1CC ANAERO    Culture NO GROWTH 5 DAYS  Final   Report Status 06/23/2015 FINAL  Final  Culture, respiratory (NON-Expectorated)     Status: None   Collection Time: 06/18/15 12:20 PM  Result Value Ref Range Status   Specimen Description TRACHEAL ASPIRATE  Final   Special Requests NONE  Final   Gram Stain   Final    MANY WBC SEEN MODERATE GRAM VARIABLE ROD EXCELLENT SPECIMEN - 90-100% WBCS    Culture Consistent with normal respiratory flora.  Final   Report Status 06/20/2015 FINAL  Final  Urine culture     Status: None   Collection Time: 06/18/15  2:30 PM  Result Value Ref Range Status   Specimen Description URINE, CATHETERIZED  Final   Special Requests NONE  Final   Culture INSIGNIFICANT GROWTH  Final   Report Status 06/20/2015 FINAL  Final  Culture, bal-quantitative     Status: None   Collection Time: 06/22/15 11:10 AM  Result Value Ref Range Status   Specimen Description SPUTUM  CYTO LAVA  Final   Special Requests Immunocompromised  Final   Gram Stain   Final    GOOD SPECIMEN - 80-90% WBCS MODERATE WBC SEEN FEW GRAM POSITIVE RODS RARE GRAM POSITIVE COCCI IN PAIRS    Culture Consistent with normal respiratory flora.  Final   Report Status 06/24/2015 FINAL  Final  Fungus Culture with Smear     Status: None (Preliminary result)   Collection Time: 06/22/15 11:10 AM  Result Value Ref Range Status   Specimen Description CYTO LAVA  Final   Special Requests Immunocompromised  Final   Culture NO FUNGUS ISOLATED AFTER 19 DAYS  Final   Report Status  PENDING  Incomplete  C difficile quick scan w PCR reflex     Status: None   Collection Time: 06/25/15  2:26 PM  Result Value Ref Range Status   C Diff antigen NEGATIVE NEGATIVE Final   C Diff toxin NEGATIVE NEGATIVE Final   C Diff interpretation Negative for C. difficile  Final    Medical History: Past  Medical History  Diagnosis Date  . Anxiety   . Arthritis   . COPD (chronic obstructive pulmonary disease) ([place])   . CHF (congestive heart failure) ([place])   . Hyperlipidemia   . Hypertension   . Diabetes mellitus without complication ([place])   . Chronic kidney disease   . Neuromuscular disorder ([facility])     Medications:  Scheduled:  . antiseptic oral rinse  7 mL Mouth Rinse QID  . budesonide (PULMICORT) nebulizer solution  0.5 mg Nebulization BID  . chlorhexidine gluconate  15 mL Mouth Rinse BID  . clonazePAM  0.5 mg Per Tube BID  . collagenase   Topical Daily  . escitalopram  5 mg Per Tube Daily  . famotidine  20 mg Per Tube BID  . feeding supplement (PRO-STAT SUGAR FREE 64)  30 mL Per Tube BID  . free water  350 mL Per Tube 4 times per day  . insulin aspart  0-20 Units Subcutaneous 6 times per day  . insulin glargine  20 Units Subcutaneous Daily  . levalbuterol  0.63 mg Nebulization Q6H  . losartan  50 mg Oral Daily  . metoprolol tartrate  100 mg Per Tube BID  . QUEtiapine  25 mg Oral BID  . senna-docusate  1 tablet Oral BID  . sodium chloride  10-40 mL Intracatheter Q12H   Infusions:  . feeding supplement (VITAL HIGH PROTEIN) 1,000 mL (07/11/15 0600)  . fentaNYL infusion INTRAVENOUS Stopped (07/11/15 0727)  . 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  Plan:  Patient with BM 12/15. Will decrease senna-docusate to 1 tab po bid and f/u AM.    Ulice Dash D 07/11/2015,11:35 AM

## 2015-07-11 NOTE — Progress Notes (Signed)
Palliative Care progress note- Patient continues to be on pressure support and the plan is to attempt to wean that at this time. She remains a full code. Upon previous palliative care involvement, attempts were made to contact the husband on several occasions to discuss goals of care. Those attempts were unsuccessful. After speaking with Dr. Megan Salon, it was decided that a face-to-face encounter with patient's husband would be most beneficial. Unit RN spoke with husband who reports he will be back to the hospital tomorrow. I have called and left a message with husband in hopes to schedule a meeting with him for Monday afternoon. Left my cell number for him to call me back. Care team updated with this information.   Atha Starks, MSW, LCSW Palliative Care social worker [phone] (c)

## 2015-07-11 NOTE — Progress Notes (Signed)
Speech Therapy Note: reviewed chart notes. MD note indicating pt is s/p trach and s/p PEGw/ difficulty weaning from vent w/ prognosis being poor. ST services will f/u w/ PMV tx w/ pt once she is able to wean from the vent and tolerate trach collar wear during the day. Rec. Oral care frequently sec. to PEG placement.

## 2015-07-12 ENCOUNTER — Inpatient Hospital Stay: Payer: Medicare Other

## 2015-07-12 DIAGNOSIS — L8990 Pressure ulcer of unspecified site, unspecified stage: Secondary | ICD-10-CM

## 2015-07-12 LAB — BASIC METABOLIC PANEL
ANION GAP: 7 (ref 5–15)
BUN: 50 mg/dL — AB (ref 6–20)
CALCIUM: 8.8 mg/dL — AB (ref 8.9–10.3)
CHLORIDE: 109 mmol/L (ref 101–111)
CO2: 30 mmol/L (ref 22–32)
Creatinine, Ser: 0.72 mg/dL (ref 0.44–1.00)
GFR calc Af Amer: >60 mL/min (ref >60)
GFR calc non Af Amer: >60 mL/min (ref >60)
GLUCOSE: 138 mg/dL — AB (ref 65–99)
POTASSIUM: 3.8 mmol/L (ref 3.5–5.1)
Sodium: 146 mmol/L — ABNORMAL HIGH (ref 135–145)

## 2015-07-12 LAB — GLUCOSE, CAPILLARY
Glucose-Capillary: 106 mg/dL — ABNORMAL HIGH (ref 65–99)
Glucose-Capillary: 124 mg/dL — ABNORMAL HIGH (ref 65–99)
Glucose-Capillary: 127 mg/dL — ABNORMAL HIGH (ref 65–99)
Glucose-Capillary: 129 mg/dL — ABNORMAL HIGH (ref 65–99)
Glucose-Capillary: 141 mg/dL — ABNORMAL HIGH (ref 65–99)
Glucose-Capillary: 147 mg/dL — ABNORMAL HIGH (ref 65–99)

## 2015-07-12 MED ORDER — FREE WATER
30.0000 mL | Freq: Every day | Status: DC
  Administered 2015-07-12 – 2015-07-14 (×10): 30 mL

## 2015-07-12 MED ORDER — SODIUM CHLORIDE 0.9 % IV BOLUS (SEPSIS)
500.0000 mL | Freq: Once | INTRAVENOUS | Status: AC
  Administered 2015-07-12: 500 mL via INTRAVENOUS

## 2015-07-12 MED ORDER — CHLORHEXIDINE GLUCONATE 0.12% ORAL RINSE (MEDLINE KIT)
15.0000 mL | Freq: Two times a day (BID) | OROMUCOSAL | Status: DC
  Administered 2015-07-12 – 2015-07-15 (×7): 15 mL via OROMUCOSAL
  Filled 2015-07-12 (×10): qty 15

## 2015-07-12 MED ORDER — ANTISEPTIC ORAL RINSE SOLUTION (CORINZ)
7.0000 mL | OROMUCOSAL | Status: DC
  Administered 2015-07-12 – 2015-07-15 (×35): 7 mL via OROMUCOSAL
  Filled 2015-07-12 (×43): qty 7

## 2015-07-12 NOTE — Consult Note (Signed)
Electrolyte CONSULT NOTE - FOLLOW UP  Pharmacy Consult for Electrolyte monitoring and replacement  Patient Measurements: Height: '5\' 7"'$  (170.2 cm) Weight: 212 lb 11.9 oz (96.5 kg) IBW/kg (Calculated) : 61.6  Vital Signs: Temp: 97.3 F (36.3 C) (12/17 0400) Temp Source: Axillary (12/17 0400) BP: 157/75 mmHg (12/17 0600) Pulse Rate: 110 (12/17 0600)  Recent Labs  07/10/15 0512 07/11/15 0433 07/12/15 0530  NA 141 144 146*  K 3.7 3.7 3.8  CL 109 111 109  CO2 '27 29 30  '$ GLUCOSE 130* 150* 138*  BUN 48* 49* 50*  CREATININE 0.71 0.77 0.72  CALCIUM 8.6* 8.9 8.8*  MG 1.5* 1.7  --   PHOS 2.4* 3.0  --    Estimated Creatinine Clearance: 81.4 mL/min (by C-G formula based on Cr of 0.72).   Recent Labs  07/11/15 2343 07/12/15 0352 07/12/15 0726  GLUCAP 141* 124* 127*    Assessment: 69 yo female with SCLC current;y intubated in ICU, s/p trach, s/p PEG-difficult to wean from vent. Pharmacy consulted for monitoring and managing electrolytes.    Plan:  Labs are wnl except for phos which is low and has been replaced with 20 mmol Kphos. Will f/u am labs.  12/15 AM Mg 1.5, PO4 2.4. Replacement electrolytes ordered by MD. Will recheck with tomorrow AM labs.   12/16 AM electrolytes WNL. Recheck BMP in AM.  12/17  AM electrolytes WNL. Will recheck labs in am.  Chinita Greenland PharmD Clinical Pharmacist 07/12/2015 8:30 AM

## 2015-07-12 NOTE — Progress Notes (Addendum)
* [facility]  69 year old female with new diagnosis of small cell lung cancer, subsequently intubated due to right lung atelectasis and respiratory failure, because of the lung cancer. She is now status post 2 cycles of chemotherapy to see if this tumor can be shrunk.  S/p trach s/p PEG  difficult to wean from vent prognosis is poor.    Assessment and Plan:  Prolonged VDRF -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, chemotherapy; Significant improvement in airway patency by bronchoscopy 11/22. -Continue vent weaning, this and has been weaned to 35% trach collar. -placed back on full vent support due to acidosis and hypoxia-will attempt to wean to PS mode Off fentanyl infusion, willuse PRN meds Plan to wean off PS mode right now PS at 15  Right lung, small cell lung cancer. -Status post 2 cycles of chemotherapy  -s/p PEG 07/19/2015 -rad/onc willing to do palliative radiation once completely liberated from the vent.   Right Pleural Effusion vs chronic atelectasis - will continue to monitor - last ct 06/06/15, will consider another CT early next week - if pleural effusion present, will then consider thora.  - dec Free H20 flushes now that hyponatremia is resolved.   COPD  -Cont nebulized steroids, does not appear to be in exacerbation at this time  -Cont nebulized bronchodilators.  PSVT  -Cont scheduled enteral metoprolol  Anemia/leukopenia/thrombocytopenia - pancytopenia -We'll continue to monitor CBC, transfuse as needed. S/p 1 unit yesterday, will follow h/h  Agitated, delirium. Severe debility and deconditioning - now improving -Likely some degree of sundowning, continue benzodiazepines, including, Klonopin, and when necessary  Versed as needed   Hypernatremia - resolved -Improved after stopping Lasix, and continuing with free water replacement. We'll continue to monitor. - s/p PEG placement, continue  Free H2O flushes.  Volume overload - resolved,  will monitor. The patient is now off Lasix.   Follow up palliative care consult  Monitor BMET intermittently,PT/OT Monitor I/Os DVT px: SCDs Monitor CBC intermittently Transfuse per usual guidelines   Date: 07/12/2015  MRN# [id number] Emily Pratt Aug 21, 1945   Emily Pratt is a 69 y.o. old female seen in follow up for chief complaint of  Chief Complaint  Patient presents with  . Respiratory Distress     HPI/ROS: Currently on AC mode, will cotninue vent weaning as tolerated,sedation as tolerated unable to provide ROS Wean PS as tolerated This morning with slightly low BP, required 500cc NS bolus, and cutting back on the fentanyl  Allergies:  Atorvastatin and Rosuvastatin  Review of Systems  Unable to perform ROS: acuity of condition     Physical Examination:   VS: BP 161/61 mmHg  Pulse 91  Temp(Src) 97.3 F (36.3 C) (Axillary)  Resp 20  Ht '5\' 7"'$  (1.702 m)  Wt 212 lb 11.9 oz (96.5 kg)  BMI 33.31 kg/m2  SpO2 91%  General Appearance: lethargic,somnelent,  Neuro:without focal findings,confused, more calm today HEENT: PERRLA, EOM intact. Pulmonary: coarse upper airway sounds, upper airway congestion,  CardiovascularNormal S1,S2.  No m/r/g.   Abdomen: Benign, Soft, mild tender, LUQ PEG in place.     LABORATORY PANEL:   CBC  Recent Labs Lab 07/11/15 0434  WBC 7.5  HGB 8.1*  HCT 26.0*  PLT 86*   ------------------------------------------------------------------------------------------------------------------  Chemistries   Recent Labs Lab 07/11/15 0433 07/12/15 0530  NA 144 146*  K 3.7 3.8  CL 111 109  CO2 29 30  GLUCOSE 150* 138*  BUN 49* 50*  CREATININE 0.77 0.72  CALCIUM 8.9 8.8*  MG 1.7  --    ------------------------------------------------------------------------------------------------------------------    The Patient requires high complexity decision making for assessment and support, frequent evaluation and titration of  therapies, application of advanced monitoring technologies and extensive interpretation of multiple databases. Critical Care Time devoted to patient care services described in this note is 35  minutes.   Overall, patient is critically ill, prognosis is guarded.   Vilinda Boehringer, MD [facility] Pager [phone] (please enter 7-digits) On Call Pager - [phone] (please enter 7-digits)

## 2015-07-12 NOTE — Progress Notes (Signed)
Pharmacy Consult for Constipation Prevention   Allergies  Allergen Reactions  . Atorvastatin Other (See Comments)    Does not remember why she had Intolerance to Lipitor.  . Rosuvastatin Anxiety    Chest tigtness and feeling as if she had heartburn.    Patient Measurements: Height: '5\' 7"'$  (170.2 cm) Weight: 212 lb 11.9 oz (96.5 kg) IBW/kg (Calculated) : 61.6   Vital Signs: Temp: 97.3 F (36.3 C) (12/17 0400) Temp Source: Axillary (12/17 0400) BP: 157/75 mmHg (12/17 0600) Pulse Rate: 110 (12/17 0600) Intake/Output from previous day: 12/16 0701 - 12/17 0700 In: 2182.3 [I.V.:202.3; NG/GT:1730] Out: 1975 [UYQIH:4742] Intake/Output from this shift: Total I/O In: -  Out: 20 [Urine:20]  Labs:  Recent Labs  07/09/15 0932 07/10/15 0512 07/11/15 0433 07/11/15 0434 07/12/15 0530  WBC 7.6 15.0*  --  7.5  --   HGB 6.3* 8.2*  --  8.1*  --   HCT 19.4* 25.5*  --  26.0*  --   PLT 59* 72*  --  86*  --   CREATININE  --  0.71 0.77  --  0.72  MG  --  1.5* 1.7  --   --   PHOS  --  2.4* 3.0  --   --    Estimated Creatinine Clearance: 81.4 mL/min (by C-G formula based on Cr of 0.72).   Microbiology: Recent Results (from the past 720 hour(s))  Culture, blood (routine x 2)     Status: None   Collection Time: 06/12/15 11:54 AM  Result Value Ref Range Status   Specimen Description BLOOD LEFT ASSIST CONTROL  Final   Special Requests BOTTLES DRAWN AEROBIC AND ANAEROBIC  3CC  Final   Culture NO GROWTH 6 DAYS  Final   Report Status 06/18/2015 FINAL  Final  Culture, blood (routine x 2)     Status: None   Collection Time: 06/12/15 11:55 AM  Result Value Ref Range Status   Specimen Description BLOOD LEFT FA  Final   Special Requests   Final    BOTTLES DRAWN AEROBIC AND ANAEROBIC 2CC ANAERO 3CC AERO   Culture NO GROWTH 6 DAYS  Final   Report Status 06/18/2015 FINAL  Final  Urine culture     Status: None   Collection Time: 06/12/15 11:55 AM  Result Value Ref Range Status   Specimen  Description URINE, CATHETERIZED  Final   Special Requests Immunocompromised  Final   Culture 10,000 COLONIES/mL CANDIDA ALBICANS  Final   Report Status 06/15/2015 FINAL  Final  Culture, expectorated sputum-assessment     Status: None   Collection Time: 06/12/15  4:44 PM  Result Value Ref Range Status   Specimen Description TRACHEAL ASPIRATE  Final   Special Requests Immunocompromised  Final   Sputum evaluation THIS SPECIMEN IS ACCEPTABLE FOR SPUTUM CULTURE  Final   Report Status 06/12/2015 FINAL  Final  Culture, respiratory (NON-Expectorated)     Status: None   Collection Time: 06/12/15  4:44 PM  Result Value Ref Range Status   Specimen Description TRACHEAL ASPIRATE  Final   Special Requests Immunocompromised Reflexed from [id number]  Final   Gram Stain   Final    GOOD SPECIMEN - 80-90% WBCS MODERATE WBC SEEN MODERATE GRAM VARIABLE ROD FEW GRAM POSITIVE COCCI IN PAIRS MANY GRAM POSITIVE RODS    Culture Consistent with normal respiratory flora.  Final   Report Status 06/15/2015 FINAL  Final  Culture, expectorated sputum-assessment     Status: None   Collection Time: 06/14/15  5:19 PM  Result Value Ref Range Status   Specimen Description SPUTUM  Final   Special Requests NONE  Final   Sputum evaluation THIS SPECIMEN IS ACCEPTABLE FOR SPUTUM CULTURE  Final   Report Status 06/14/2015 FINAL  Final  Culture, respiratory (NON-Expectorated)     Status: None   Collection Time: 06/14/15  5:19 PM  Result Value Ref Range Status   Specimen Description SPUTUM  Final   Special Requests NONE Reflexed from [id number]  Final   Gram Stain   Final    FAIR SPECIMEN - 70-80% WBCS MODERATE WBC SEEN MANY GRAM POSITIVE RODS FEW GRAM POSITIVE COCCI IN PAIRS RARE GRAM NEGATIVE RODS    Culture Consistent with normal respiratory flora.  Final   Report Status 06/11/2015 FINAL  Final  Culture, blood (routine x 2)     Status: None   Collection Time: 06/18/15 11:39 AM  Result Value Ref Range Status   Specimen  Description BLOOD LEFT HAND  Final   Special Requests   Final    BOTTLES DRAWN AEROBIC AND ANAEROBIC ANAERO [place] AERO 3CC   Culture NO GROWTH 5 DAYS  Final   Report Status 06/23/2015 FINAL  Final  Culture, blood (routine x 2)     Status: None   Collection Time: 06/18/15 12:10 PM  Result Value Ref Range Status   Specimen Description BLOOD  FLUID IN BODY  Final   Special Requests   Final    BOTTLES DRAWN AEROBIC AND ANAEROBIC AERO 1CC ANAERO    Culture NO GROWTH 5 DAYS  Final   Report Status 06/23/2015 FINAL  Final  Culture, respiratory (NON-Expectorated)     Status: None   Collection Time: 06/18/15 12:20 PM  Result Value Ref Range Status   Specimen Description TRACHEAL ASPIRATE  Final   Special Requests NONE  Final   Gram Stain   Final    MANY WBC SEEN MODERATE GRAM VARIABLE ROD EXCELLENT SPECIMEN - 90-100% WBCS    Culture Consistent with normal respiratory flora.  Final   Report Status 06/20/2015 FINAL  Final  Urine culture     Status: None   Collection Time: 06/18/15  2:30 PM  Result Value Ref Range Status   Specimen Description URINE, CATHETERIZED  Final   Special Requests NONE  Final   Culture INSIGNIFICANT GROWTH  Final   Report Status 06/20/2015 FINAL  Final  Culture, bal-quantitative     Status: None   Collection Time: 06/22/15 11:10 AM  Result Value Ref Range Status   Specimen Description SPUTUM  CYTO LAVA  Final   Special Requests Immunocompromised  Final   Gram Stain   Final    GOOD SPECIMEN - 80-90% WBCS MODERATE WBC SEEN FEW GRAM POSITIVE RODS RARE GRAM POSITIVE COCCI IN PAIRS    Culture Consistent with normal respiratory flora.  Final   Report Status 06/24/2015 FINAL  Final  Fungus Culture with Smear     Status: None (Preliminary result)   Collection Time: 06/22/15 11:10 AM  Result Value Ref Range Status   Specimen Description CYTO LAVA  Final   Special Requests Immunocompromised  Final   Culture NO FUNGUS ISOLATED AFTER 20 DAYS  Final   Report Status  PENDING  Incomplete  C difficile quick scan w PCR reflex     Status: None   Collection Time: 06/25/15  2:26 PM  Result Value Ref Range Status   C Diff antigen NEGATIVE NEGATIVE Final   C Diff toxin NEGATIVE NEGATIVE Final  C Diff interpretation Negative for C. difficile  Final    Medical History: Past Medical History  Diagnosis Date  . Anxiety   . Arthritis   . COPD (chronic obstructive pulmonary disease) ([place])   . CHF (congestive heart failure) ([place])   . Hyperlipidemia   . Hypertension   . Diabetes mellitus without complication ([place])   . Chronic kidney disease   . Neuromuscular disorder ([place])     Medications:  Scheduled:  . antiseptic oral rinse  7 mL Mouth Rinse 10 times per day  . budesonide (PULMICORT) nebulizer solution  0.5 mg Nebulization BID  . chlorhexidine gluconate  15 mL Mouth Rinse BID  . clonazePAM  0.5 mg Per Tube BID  . collagenase   Topical Daily  . escitalopram  5 mg Per Tube Daily  . famotidine  20 mg Per Tube BID  . feeding supplement (PRO-STAT SUGAR FREE 64)  30 mL Per Tube BID  . free water  350 mL Per Tube 4 times per day  . insulin aspart  0-20 Units Subcutaneous 6 times per day  . insulin glargine  20 Units Subcutaneous Daily  . levalbuterol  0.63 mg Nebulization Q6H  . losartan  50 mg Oral Daily  . metoprolol tartrate  100 mg Per Tube BID  . QUEtiapine  25 mg Oral BID  . senna-docusate  1 tablet Oral BID  . sodium chloride  10-40 mL Intracatheter Q12H   Infusions:  . feeding supplement (VITAL HIGH PROTEIN) 1,000 mL (07/12/15 0600)  . fentaNYL infusion INTRAVENOUS 150 mcg/hr (07/12/15 0636)  . 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   Plan:  Patient with BM 12/15. Will decrease senna-docusate to 1 tab po bid and f/u AM.   12/17 Continue current regimen of Senna-docusate 1 tab po bid. Patient on Continuous tube  feeds.   Chinita Greenland PharmD Clinical Pharmacist 07/12/2015 ,8:33 AM

## 2015-07-13 DIAGNOSIS — I480 Paroxysmal atrial fibrillation: Secondary | ICD-10-CM

## 2015-07-13 DIAGNOSIS — R451 Restlessness and agitation: Secondary | ICD-10-CM

## 2015-07-13 LAB — COMPREHENSIVE METABOLIC PANEL
ALBUMIN: 2.5 g/dL — AB (ref 3.5–5.0)
ALK PHOS: 134 U/L — AB (ref 38–126)
ALT: 30 U/L (ref 14–54)
ANION GAP: 8 (ref 5–15)
AST: 29 U/L (ref 15–41)
BILIRUBIN TOTAL: 0.9 mg/dL (ref 0.3–1.2)
BUN: 47 mg/dL — AB (ref 6–20)
CALCIUM: 8.6 mg/dL — AB (ref 8.9–10.3)
CO2: 29 mmol/L (ref 22–32)
Chloride: 110 mmol/L (ref 101–111)
Creatinine, Ser: 0.68 mg/dL (ref 0.44–1.00)
GFR calc Af Amer: >60 mL/min (ref >60)
GFR calc non Af Amer: >60 mL/min (ref >60)
GLUCOSE: 76 mg/dL (ref 65–99)
Potassium: 4.1 mmol/L (ref 3.5–5.1)
SODIUM: 147 mmol/L — AB (ref 135–145)
TOTAL PROTEIN: 5.8 g/dL — AB (ref 6.5–8.1)

## 2015-07-13 LAB — BASIC METABOLIC PANEL
Anion gap: 5 (ref 5–15)
BUN: 48 mg/dL — AB (ref 6–20)
CO2: 29 mmol/L (ref 22–32)
Calcium: 8.4 mg/dL — ABNORMAL LOW (ref 8.9–10.3)
Chloride: 111 mmol/L (ref 101–111)
Creatinine, Ser: 0.79 mg/dL (ref 0.44–1.00)
GFR calc Af Amer: >60 mL/min (ref >60)
GLUCOSE: 121 mg/dL — AB (ref 65–99)
POTASSIUM: 4 mmol/L (ref 3.5–5.1)
Sodium: 145 mmol/L (ref 135–145)

## 2015-07-13 LAB — CBC WITH DIFFERENTIAL/PLATELET
BASOS ABS: 0 10*3/uL (ref 0–0.1)
BASOS PCT: 1 %
EOS ABS: 0 10*3/uL (ref 0–0.7)
Eosinophils Relative: 1 %
HEMATOCRIT: 22.6 % — AB (ref 35.0–47.0)
HEMOGLOBIN: 7.2 g/dL — AB (ref 12.0–16.0)
Lymphocytes Relative: 19 %
Lymphs Abs: 1 10*3/uL (ref 1.0–3.6)
MCH: 29.6 pg (ref 26.0–34.0)
MCHC: 31.6 g/dL — ABNORMAL LOW (ref 32.0–36.0)
MCV: 93.4 fL (ref 80.0–100.0)
MONOS PCT: 10 %
Monocytes Absolute: 0.5 10*3/uL (ref 0.2–0.9)
NEUTROS ABS: 3.7 10*3/uL (ref 1.4–6.5)
NEUTROS PCT: 69 %
Platelets: 135 10*3/uL — ABNORMAL LOW (ref 150–440)
RBC: 2.42 MIL/uL — AB (ref 3.80–5.20)
RDW: 19.7 % — ABNORMAL HIGH (ref 11.5–14.5)
WBC: 5.3 10*3/uL (ref 3.6–11.0)

## 2015-07-13 LAB — FUNGUS CULTURE W SMEAR

## 2015-07-13 LAB — GLUCOSE, CAPILLARY
Glucose-Capillary: 120 mg/dL — ABNORMAL HIGH (ref 65–99)
Glucose-Capillary: 124 mg/dL — ABNORMAL HIGH (ref 65–99)
Glucose-Capillary: 134 mg/dL — ABNORMAL HIGH (ref 65–99)
Glucose-Capillary: 160 mg/dL — ABNORMAL HIGH (ref 65–99)
Glucose-Capillary: 208 mg/dL — ABNORMAL HIGH (ref 65–99)
Glucose-Capillary: 74 mg/dL (ref 65–99)
Glucose-Capillary: 80 mg/dL (ref 65–99)

## 2015-07-13 LAB — PHOSPHORUS: Phosphorus: 3.9 mg/dL (ref 2.5–4.6)

## 2015-07-13 LAB — MAGNESIUM: Magnesium: 1.5 mg/dL — ABNORMAL LOW (ref 1.7–2.4)

## 2015-07-13 MED ORDER — SODIUM CHLORIDE 0.9 % IV SOLN
40.0000 mg | Freq: Two times a day (BID) | INTRAVENOUS | Status: DC
  Administered 2015-07-13 – 2015-07-14 (×3): 40 mg via INTRAVENOUS
  Filled 2015-07-13 (×4): qty 4

## 2015-07-13 MED ORDER — DIPHENHYDRAMINE HCL 50 MG/ML IJ SOLN
50.0000 mg | Freq: Once | INTRAMUSCULAR | Status: AC
  Administered 2015-07-13: 50 mg via INTRAVENOUS
  Filled 2015-07-13: qty 1

## 2015-07-13 MED ORDER — FAMOTIDINE IN NACL 20-0.9 MG/50ML-% IV SOLN
20.0000 mg | INTRAVENOUS | Status: DC
  Filled 2015-07-13 (×2): qty 50

## 2015-07-13 MED ORDER — SODIUM CHLORIDE 0.9 % IV SOLN
40.0000 mg | Freq: Two times a day (BID) | INTRAVENOUS | Status: DC
  Filled 2015-07-13 (×2): qty 4

## 2015-07-13 MED ORDER — METHYLPREDNISOLONE SODIUM SUCC 125 MG IJ SOLR
60.0000 mg | Freq: Four times a day (QID) | INTRAMUSCULAR | Status: DC
  Administered 2015-07-13 – 2015-07-14 (×4): 60 mg via INTRAVENOUS
  Filled 2015-07-13 (×4): qty 2

## 2015-07-13 MED ORDER — MAGNESIUM SULFATE 4 GM/100ML IV SOLN
4.0000 g | Freq: Once | INTRAVENOUS | Status: AC
  Administered 2015-07-13: 4 g via INTRAVENOUS
  Filled 2015-07-13: qty 100

## 2015-07-13 MED ORDER — EPINEPHRINE HCL 0.1 MG/ML IJ SOSY
0.2000 mg | PREFILLED_SYRINGE | Freq: Once | INTRAMUSCULAR | Status: AC
  Administered 2015-07-13: 0.2 mg via INTRAVENOUS
  Filled 2015-07-13: qty 10

## 2015-07-13 NOTE — Progress Notes (Addendum)
Patient with acute tongue swelling. Large tongue, BP starting to decrease to SBP in 80s This is a new and acute change. Review of meds show no new meds given overnight or this morning. STOP Cozaar  Plan - IV benadryl - IV epi - IV pepcid - IV steroids - may need to start levophed if BP continues to decrease - cxr with inc effusions  CC time - 65mns  VVilinda Boehringer MD [facility] Pager [phone](please enter 7-digits) On Call Pager - [phone] (please enter 7-digits)

## 2015-07-13 NOTE — Progress Notes (Addendum)
Patient combative and agitated. Sitter at bedside trying to keep patient from pulling her trach and vent. Medication given.

## 2015-07-13 NOTE — Progress Notes (Signed)
Pharmacy Consult for Constipation Prevention   Allergies  Allergen Reactions  . Atorvastatin Other (See Comments)    Does not remember why she had Intolerance to Lipitor.  . Rosuvastatin Anxiety    Chest tigtness and feeling as if she had heartburn.    Patient Measurements: Height: '5\' 7"'$  (170.2 cm) Weight: 214 lb 4.6 oz (97.2 kg) IBW/kg (Calculated) : 61.6   Vital Signs: Temp: 97.1 F (36.2 C) (12/18 1243) Temp Source: Axillary (12/18 1243) BP: 93/62 mmHg (12/18 1243) Pulse Rate: 63 (12/18 1243) Intake/Output from previous day: 12/17 0701 - 12/18 0700 In: 2630.5 [I.V.:250.5; NG/GT:1380] Out: 9892 [Urine:1163; Stool:1] Intake/Output from this shift: Total I/O In: 670.8 [I.V.:105.8; NG/GT:465; IV Piggyback:100] Out: 260 [Urine:260]   Medical History: Past Medical History  Diagnosis Date  . Anxiety   . Arthritis   . COPD (chronic obstructive pulmonary disease) ([place])   . CHF (congestive heart failure) ([place])   . Hyperlipidemia   . Hypertension   . Diabetes mellitus without complication ([place])   . Chronic kidney disease   . Neuromuscular disorder ([place])     Medications:  Scheduled:  . antiseptic oral rinse  7 mL Mouth Rinse 10 times per day  . budesonide (PULMICORT) nebulizer solution  0.5 mg Nebulization BID  . chlorhexidine gluconate  15 mL Mouth Rinse BID  . clonazePAM  0.5 mg Per Tube BID  . collagenase   Topical Daily  . EPINEPHrine  0.2 mg Intravenous Once  . escitalopram  5 mg Per Tube Daily  . famotidine (PEPCID) IV  40 mg Intravenous Q12H  . feeding supplement (PRO-STAT SUGAR FREE 64)  30 mL Per Tube BID  . free water  30 mL Per Tube 6 X Daily  . insulin aspart  0-20 Units Subcutaneous 6 times per day  . insulin glargine  20 Units Subcutaneous Daily  . levalbuterol  0.63 mg Nebulization Q6H  . methylPREDNISolone (SOLU-MEDROL) injection  60 mg Intravenous Q6H  . metoprolol tartrate  100 mg Per Tube BID  . QUEtiapine  25 mg Oral BID  . senna-docusate  1  tablet Oral BID  . sodium chloride  10-40 mL Intracatheter Q12H   Infusions:  . feeding supplement (VITAL HIGH PROTEIN) 1,000 mL (07/13/15 1245)  . fentaNYL infusion INTRAVENOUS 100 mcg/hr (07/13/15 1245)  . 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   Plan:  Patient with BM 12/15. Will decrease senna-docusate to 1 tab po bid and f/u AM.   12/17 Continue current regimen of Senna-docusate 1 tab po bid. Patient on Continuous tube feeds.  12/18: BM x 1. Continue current regimen.   Chinita Greenland PharmD Clinical Pharmacist 07/13/2015 ,1:27 PM

## 2015-07-13 NOTE — Progress Notes (Signed)
* Emily Pratt  69 year old female with new diagnosis of small cell lung cancer, subsequently intubated due to right lung atelectasis and respiratory failure, because of the lung cancer. She is now status post 2 cycles of chemotherapy to see if this tumor can be shrunk.  S/p trach s/p PEG  difficult to wean from vent prognosis is poor.    Assessment and Plan:  Prolonged VDRF -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, chemotherapy; Significant improvement in airway patency by bronchoscopy 11/22. -Continue vent weaning, this and has been weaned to 35% trach collar. -placed back on full vent support due to acidosis and hypoxia-will attempt to wean to PS mode Off fentanyl infusion, willuse PRN meds Plan to wean off PS mode right now PS at 15  Right lung, small cell lung cancer. -Status post 2 cycles of chemotherapy  -s/p PEG 06/29/2015 -rad/onc willing to do palliative radiation once completely liberated from the vent.   Right Pleural Effusion vs chronic atelectasis - will continue to monitor - last ct 06/06/15, will consider another CT early next week - if pleural effusion present, will then consider thora.  - dec Free H20 flushes now that hyponatremia is resolved.   COPD  -Cont nebulized steroids, does not appear to be in exacerbation at this time  -Cont nebulized bronchodilators.  PSVT /Afib -Cont scheduled enteral metoprolol  Anemia/leukopenia/thrombocytopenia - pancytopenia -We'll continue to monitor CBC, transfuse as needed. S/p 1 unit yesterday, will follow h/h  Agitated, delirium. Severe debility and deconditioning - now improving -Likely some degree of sundowning, continue benzodiazepines, including, Klonopin, and when necessary  Versed as needed  Currently on Fentanyl gtt, requiring higher doses - wean as tolerated, if not may need to consider switching back to precedex (and backing of the beta blocker for paroxysmal afib).   Hypernatremia -  resolved -Improved after stopping Lasix, and continuing with free water replacement. We'll continue to monitor. - s/p PEG placement, continue  Free H2O flushes.  Volume overload - resolved, will monitor. The patient is now off Lasix.   Follow up palliative care consult  Monitor BMET intermittently,PT/OT Monitor I/Os DVT px: SCDs Monitor CBC intermittently Transfuse per usual guidelines   Date: 07/13/2015  MRN# [id number] Emily Pratt 1946-06-14   Emily Pratt is a 69 y.o. old female seen in follow up for chief complaint of  Chief Complaint  Patient presents with  . Respiratory Distress     SUBJECTIVE: Currently on AC mode, will cotninue vent weaning as tolerated,sedation as tolerated unable to provide ROS Wean PS as tolerated This morning with more agitation, fentanyl drip increased to 175 g  Allergies:  Atorvastatin and Rosuvastatin  Review of Systems  Unable to perform ROS: acuity of condition     Physical Examination:   VS: BP 106/53 mmHg  Pulse 70  Temp(Src) 98.1 F (36.7 C) (Axillary)  Resp 21  Ht '5\' 7"'$  (1.702 m)  Wt 214 lb 4.6 oz (97.2 kg)  BMI 33.55 kg/m2  SpO2 95%  General Appearance: lethargic,somnelent,  Neuro:without focal findings,confused, more agitated overnight HEENT: PERRLA, EOM intact. Pulmonary: coarse upper airway sounds, upper airway congestion,  CardiovascularNormal S1,S2.  No m/r/g.   Abdomen: Benign, Soft, mild tender, LUQ PEG in place.     LABORATORY PANEL:   CBC  Recent Labs Lab 07/11/15 0434  WBC 7.5  HGB 8.1*  HCT 26.0*  PLT 86*   ------------------------------------------------------------------------------------------------------------------  Chemistries   Recent Labs Lab 07/13/15 0459  NA 145  K 4.0  CL 111  CO2 29  GLUCOSE 121*  BUN 48*  CREATININE 0.79  CALCIUM 8.4*  MG 1.5*    ------------------------------------------------------------------------------------------------------------------    The Patient requires high complexity decision making for assessment and support, frequent evaluation and titration of therapies, application of advanced monitoring technologies and extensive interpretation of multiple databases. Critical Care Time devoted to patient care services described in this note is 35  minutes.   Overall, patient is critically ill, prognosis is guarded.   Vilinda Boehringer, MD [facility] Pager [phone] (please enter 7-digits) On Call Pager - [phone] (please enter 7-digits)

## 2015-07-13 NOTE — Consult Note (Signed)
Electrolyte CONSULT NOTE - FOLLOW UP  Pharmacy Consult for Electrolyte monitoring and replacement  Patient Measurements: Height: '5\' 7"'$  (170.2 cm) Weight: 214 lb 4.6 oz (97.2 kg) IBW/kg (Calculated) : 61.6  Vital Signs: Temp: 99.1 F (37.3 C) (12/18 0400) Temp Source: Axillary (12/18 0400) BP: 125/65 mmHg (12/18 0500) Pulse Rate: 73 (12/18 0500)  Recent Labs  07/11/15 0433 07/12/15 0530 07/13/15 0459  NA 144 146* 145  K 3.7 3.8 4.0  CL 111 109 111  CO2 '29 30 29  '$ GLUCOSE 150* 138* 121*  BUN 49* 50* 48*  CREATININE 0.77 0.72 0.79  CALCIUM 8.9 8.8* 8.4*  MG 1.7  --  1.5*  PHOS 3.0  --  3.9   Estimated Creatinine Clearance: 81.7 mL/min (by C-G formula based on Cr of 0.79).   Recent Labs  07/12/15 1959 07/13/15 0023 07/13/15 0406  GLUCAP 129* 134* 120*    Assessment: 69 yo female with SCLC current;y intubated in ICU, s/p trach, s/p PEG-difficult to wean from vent. Pharmacy consulted for monitoring and managing electrolytes.    Plan:  Labs are wnl except for phos which is low and has been replaced with 20 mmol Kphos. Will f/u am labs.  12/15 AM Mg 1.5, PO4 2.4. Replacement electrolytes ordered by MD. Will recheck with tomorrow AM labs.   12/16 AM electrolytes WNL. Recheck BMP in AM.  12/17  AM electrolytes WNL. Will recheck labs in am.  12/17 AM Mg 1.5. 4 grams magnesium sulfate IV x1 ordered. Recheck BMP and Mg in AM.  Chinita Greenland PharmD Clinical Pharmacist 07/13/2015 5:56 AM

## 2015-07-13 NOTE — Progress Notes (Signed)
Patient remains combative and agitated. Medication administered.  Sitter at bedside.

## 2015-07-14 DIAGNOSIS — R410 Disorientation, unspecified: Secondary | ICD-10-CM

## 2015-07-14 LAB — BASIC METABOLIC PANEL
ANION GAP: 5 (ref 5–15)
BUN: 57 mg/dL — AB (ref 6–20)
CALCIUM: 8.6 mg/dL — AB (ref 8.9–10.3)
CO2: 28 mmol/L (ref 22–32)
Chloride: 113 mmol/L — ABNORMAL HIGH (ref 101–111)
Creatinine, Ser: 0.96 mg/dL (ref 0.44–1.00)
GFR calc Af Amer: >60 mL/min (ref >60)
GFR calc non Af Amer: 60 mL/min — ABNORMAL LOW (ref >60)
GLUCOSE: 219 mg/dL — AB (ref 65–99)
Potassium: 4.9 mmol/L (ref 3.5–5.1)
Sodium: 146 mmol/L — ABNORMAL HIGH (ref 135–145)

## 2015-07-14 LAB — GLUCOSE, CAPILLARY
Glucose-Capillary: 122 mg/dL — ABNORMAL HIGH (ref 65–99)
Glucose-Capillary: 123 mg/dL — ABNORMAL HIGH (ref 65–99)
Glucose-Capillary: 200 mg/dL — ABNORMAL HIGH (ref 65–99)
Glucose-Capillary: 203 mg/dL — ABNORMAL HIGH (ref 65–99)
Glucose-Capillary: 218 mg/dL — ABNORMAL HIGH (ref 65–99)
Glucose-Capillary: 231 mg/dL — ABNORMAL HIGH (ref 65–99)
Glucose-Capillary: 247 mg/dL — ABNORMAL HIGH (ref 65–99)

## 2015-07-14 LAB — MAGNESIUM: Magnesium: 2.2 mg/dL (ref 1.7–2.4)

## 2015-07-14 MED ORDER — FREE WATER
200.0000 mL | Freq: Every day | Status: DC
  Administered 2015-07-14 – 2015-07-15 (×7): 200 mL

## 2015-07-14 MED ORDER — FAMOTIDINE 40 MG/5ML PO SUSR
20.0000 mg | Freq: Two times a day (BID) | ORAL | Status: DC
  Administered 2015-07-14 – 2015-07-15 (×3): 20 mg
  Filled 2015-07-14 (×4): qty 2.5

## 2015-07-14 NOTE — Progress Notes (Signed)
Nutrition Follow-up       INTERVENTION:  EN: Continue vital high protein at 53m/hr and free water flush 2072mq 6 hr (per Dr. SiAlva Garnetvia PEG.   NUTRITION DIAGNOSIS:   Inadequate oral intake related to acute illness as evidenced by NPO status being addressed with tube feeding    GOAL:   Patient will meet greater than or equal to 90% of their needs    MONITOR:    (Energy Intake, Anthropometrics, Electrolyte/Renal Profile, Digestive System, Electrolyte/Renal Profile)  REASON FOR ASSESSMENT:   Consult Enteral/tube feeding initiation and management  ASSESSMENT:      Pt remains on vent.  Noted to have acute tongue swelling on 12/18, cozaar stopped.   Planning palliative care meeting at 12:30 today.  Current Nutrition: Tolerating vital high protein at 6057mr.  Noted free water flush decreased from 350m56m6 hr to 30ml49m hr on 12/17 at 18:00.   Gastrointestinal Profile: Last BM: 12/19   Scheduled Medications:  . antiseptic oral rinse  7 mL Mouth Rinse 10 times per day  . budesonide (PULMICORT) nebulizer solution  0.5 mg Nebulization BID  . chlorhexidine gluconate  15 mL Mouth Rinse BID  . clonazePAM  0.5 mg Per Tube BID  . collagenase   Topical Daily  . escitalopram  5 mg Per Tube Daily  . famotidine  20 mg Per Tube BID  . feeding supplement (PRO-STAT SUGAR FREE 64)  30 mL Per Tube BID  . free water  200 mL Per Tube 6 X Daily  . insulin aspart  0-20 Units Subcutaneous 6 times per day  . insulin glargine  20 Units Subcutaneous Daily  . levalbuterol  0.63 mg Nebulization Q6H  . metoprolol tartrate  100 mg Per Tube BID  . QUEtiapine  25 mg Oral BID  . senna-docusate  1 tablet Oral BID  . sodium chloride  10-40 mL Intracatheter Q12H    Continuous Medications:  . feeding supplement (VITAL HIGH PROTEIN) 1,000 mL (07/14/15 0700)  . fentaNYL infusion INTRAVENOUS 200 mcg/hr (07/14/15 0959)     Electrolyte/Renal Profile and Glucose Profile:   Recent  Labs Lab 07/10/15 0512 07/11/15 0433  07/13/15 0459 07/13/15 1229 07/14/15 0510  NA 141 144  < > 145 147* 146*  K 3.7 3.7  < > 4.0 4.1 4.9  CL 109 111  < > 111 110 113*  CO2 27 29  < > '29 29 28  '$ BUN 48* 49*  < > 48* 47* 57*  CREATININE 0.71 0.77  < > 0.79 0.68 0.96  CALCIUM 8.6* 8.9  < > 8.4* 8.6* 8.6*  MG 1.5* 1.7  --  1.5*  --  2.2  PHOS 2.4* 3.0  --  3.9  --   --   GLUCOSE 130* 150*  < > 121* 76 219*  < > = values in this interval not displayed. Protein Profile:  Recent Labs Lab 07/13/15 1229  ALBUMIN 2.5*      Weight Trend since Admission: Filed Weights   07/12/15 0500 07/13/15 0452 07/14/15 0400  Weight: 212 lb 11.9 oz (96.5 kg) 214 lb 4.6 oz (97.2 kg) 213 lb 3 oz (96.7 kg)      Diet Order:   NPO  Skin:   (Stage II pressure ulcer on sacrum)  Height:   Ht Readings from Last 1 Encounters:  07/14/15 '5\' 7"'$  (1.702 m)    Weight:   Wt Readings from Last 1 Encounters:  07/14/15 213 lb 3 oz (  96.7 kg)    Ideal Body Weight:     BMI:  Body mass index is 33.38 kg/(m^2).  Estimated Nutritional Needs:   Kcal:  1254-1596kcals, (11-14kcals/kg) using current weight of 114kg)  Protein:  122-153 g (2.0-2.5 g/kg IBW) or 140-176 g (1.2-1.5 g/kg current wt)  Fluid:  1535-18103m of fluid (25-332mkg)  EDUCATION NEEDS:   No education needs identified at this time  HIGH Care Level  Nicolena Schurman B. AlZenia ResidesRDLouisburgLDLandrumpager)

## 2015-07-14 NOTE — Progress Notes (Signed)
PT Cancellation Note  Patient Details Name: Emily Pratt MRN: [id number] DOB: Jul 19, 1946   Cancelled Treatment:    Reason Eval/Treat Not Completed: Medical issues which prohibited therapy (Patient consistently unable to participate with skilled PT at this time.  Per documentation in chart, patient now DNR with plans for discussion regarding terminal vent weaning.  Will complete initial thearapy order; please re-consult should needs/goals of care change.)  Lillie Bollig H. Owens Shark, PT, DPT, NCS 07/14/2015, 2:27 PM [phone]

## 2015-07-14 NOTE — Progress Notes (Signed)
*   [facility]   Date: 07/14/2015  MRN# [id number] Emily Pratt 1945/11/12  69 year old female with new diagnosis of small cell lung cancer, subsequently intubated due to right lung atelectasis and respiratory failure, because of the lung cancer. She is now status post 2 cycles of chemotherapy to see if this tumor can be shrunk.  S/p trach s/p PEG  difficult to wean from vent prognosis is poor.     SUBJECTIVE: RASS -2, -3 on fentanyl gtt. n progress weaning. Events of yesterday note.   Allergies:  Atorvastatin and Rosuvastatin  Review of Systems  Unable to perform ROS: acuity of condition     Physical Examination:   VS: BP 126/67 mmHg  Pulse 95  Temp(Src) 99.8 F (37.7 C) (Axillary)  Resp 21  Ht '5\' 7"'$  (1.702 m)  Wt 213 lb 3 oz (96.7 kg)  BMI 33.38 kg/m2  SpO2 93%   General Appearance: on vent, RASS -2, -3,  Neuro:without focal deficits HEENT: WNL, trach site clean, tongue swelling resolved Pulmonary: coarse BS, no wheezes Cardiovascular: regular, no M   Abdomen: Obese, soft, +BS, G tube site clean EXT: symmetric edema    LABORATORY PANEL:   CBC  Recent Labs Lab 07/13/15 1229  WBC 5.3  HGB 7.2*  HCT 22.6*  PLT 135*   ------------------------------------------------------------------------------------------------------------------  Chemistries   Recent Labs Lab 07/13/15 1229 07/14/15 0510  NA 147* 146*  K 4.1 4.9  CL 110 113*  CO2 29 28  GLUCOSE 76 219*  BUN 47* 57*  CREATININE 0.68 0.96  CALCIUM 8.6* 8.6*  MG  --  2.2  AST 29  --   ALT 30  --   ALKPHOS 134*  --   BILITOT 0.9  --    ------------------------------------------------------------------------------------------------------------------   Assessment and Plan: Small cell lung cancer - new dx this hospitalization  Status post 2 cycles of chemotherapy   Prognosis for functional recovery is increasingly poor Medical and Radiation Oncology following Palliative  Care following - planned family meeting today @ 12:30.  I encourage moving towards full palliation/comfort care  Prolonged VDRF Trach status R pleural effusion COPD - without bronchospasm presently Cont vent support - settings reviewed and/or adjusted Wean in PSV mode as tolerated Cont vent bundle Daily SBT if/when meets criteria Cont nebulized steroids and bronchodilators Recheck CXR 12/20  PSVT /Afib> NSR Cont scheduled enteral metoprolol  Hypernatremia (mild) Hypervolemia Monitor BMET intermittently Monitor I/Os Correct electrolytes as indicated Free water increased 12/19  G tube dependent SUP: enteral famotidine Cont TFs  No active ID issues Monitor temp, WBC count  Anemia post chemotherapy Anemia of critical illness Mild trombocytopenia Leukopenia after chemotherapy, resolved DVT px: SQ heparin Monitor CBC intermittently Transfuse per usual ICU guidelines  Agitated delirium. Severe deconditioning ICU associated discomfort Cont fentanyl gtt for now If decisions are for continued aggressive care, will need to transition off fent gtt and begin PT/OT   The Patient requires high complexity decision making for assessment and support, frequent evaluation and titration of therapies, application of advanced monitoring technologies and extensive interpretation of multiple databases. Critical Care Time devoted to patient care services described in this note is 35 minutes.   Merton Border, MD PCCM service Mobile [phone] Pager [phone]

## 2015-07-14 NOTE — Consult Note (Signed)
Electrolyte CONSULT NOTE - FOLLOW UP  Pharmacy Consult for Electrolyte monitoring and replacement  Patient Measurements: Height: '5\' 7"'$  (170.2 cm) Weight: 213 lb 3 oz (96.7 kg) IBW/kg (Calculated) : 61.6  Vital Signs: Temp: 98.8 F (37.1 C) (12/19 0400) Temp Source: Axillary (12/19 0400) BP: 163/83 mmHg (12/19 0600) Pulse Rate: 109 (12/19 0600)  Recent Labs  07/13/15 0459 07/13/15 1229 07/14/15 0510  NA 145 147* 146*  K 4.0 4.1 4.9  CL 111 110 113*  CO2 '29 29 28  '$ GLUCOSE 121* 76 219*  BUN 48* 47* 57*  CREATININE 0.79 0.68 0.96  CALCIUM 8.4* 8.6* 8.6*  MG 1.5*  --  2.2  PHOS 3.9  --   --   PROT  --  5.8*  --   ALBUMIN  --  2.5*  --   AST  --  29  --   ALT  --  30  --   ALKPHOS  --  134*  --   BILITOT  --  0.9  --    Estimated Creatinine Clearance: 67.9 mL/min (by C-G formula based on Cr of 0.96).   Recent Labs  07/14/15 0012 07/14/15 0015 07/14/15 0414  GLUCAP 247* 231* 203*    Assessment: 69 yo female with SCLC current;y intubated in ICU, s/p trach, s/p PEG-difficult to wean from vent. Pharmacy consulted for monitoring and managing electrolytes.    Plan:  Labs are wnl except for phos which is low and has been replaced with 20 mmol Kphos. Will f/u am labs.  12/15 AM Mg 1.5, PO4 2.4. Replacement electrolytes ordered by MD. Will recheck with tomorrow AM labs.   12/16 AM electrolytes WNL. Recheck BMP in AM.  12/17  AM electrolytes WNL. Will recheck labs in am.  12/18 AM Mg 1.5. 4 grams magnesium sulfate IV x1 ordered. Recheck BMP and Mg in AM.  12/19 AM electrolytes WNL. Recheck BMP in AM.  Chinita Greenland PharmD Clinical Pharmacist 07/14/2015 7:03 AM

## 2015-07-14 NOTE — Progress Notes (Signed)
Palliative Care Update  I met with Mr Crosley and pt's sister, Lyndel Safe.  We discussed the patient's course so far and her poor prognosis given that she has not been able to be weaned from ventilation and has been on it for so long.  They both voiced how bad her quality of life has become.  Mr Marzo was tearful and Fraser Din was quite assertive in stating that she knows the pt would not want to live this way.    In fact, the patient herself has been saying for several weeks now that she does not want 'all this done'.  She has voiced "Let Me Die" and "Stop doing all these things to me".  She did not want to be placed back on the vent early on but Mr Steines did not think she was in her right mind and he asked that she be put back on the vent. He now feels that she is miserable and should not be made to continue living like this.    Pt goes from 'zero to ten' in terms of either being unresponsive or swinging her fists, per nursing.  Mr Ronda would like her to be awake tomorrow so he can try to talk to her.  He agreed today to DNR and also to no more chemo.    Tomorrow, at 1:30 pm, we will meet and discuss terminal withdrawal from the ventilator.  This is the plan he and Fraser Din agreed with.  Full note to follow.  I have paged Dr. Grayland Ormond and Dr Alva Garnet to update them.    Cheri Rous, MD Palliative Care

## 2015-07-14 NOTE — Progress Notes (Signed)
Pharmacy Consult for Constipation Prevention   Allergies  Allergen Reactions  . Atorvastatin Other (See Comments)    Does not remember why she had Intolerance to Lipitor.  . Rosuvastatin Anxiety    Chest tigtness and feeling as if she had heartburn.    Patient Measurements: Height: '5\' 7"'$  (170.2 cm) Weight: 213 lb 3 oz (96.7 kg) IBW/kg (Calculated) : 61.6   Vital Signs: Temp: 99.8 F (37.7 C) (12/19 0800) Temp Source: Axillary (12/19 0800) BP: 126/67 mmHg (12/19 1100) Pulse Rate: 95 (12/19 1100) Intake/Output from previous day: 12/18 0701 - 12/19 0700 In: 2154.4 [I.V.:386.4; NG/GT:1560; IV Piggyback:208] Out: 1055 [Urine:1055] Intake/Output from this shift: Total I/O In: 357.5 [I.V.:87.5; NG/GT:270] Out: 125 [Urine:125]   Medical History: Past Medical History  Diagnosis Date  . Anxiety   . Arthritis   . COPD (chronic obstructive pulmonary disease) ([place])   . CHF (congestive heart failure) ([place])   . Hyperlipidemia   . Hypertension   . Diabetes mellitus without complication ([place])   . Chronic kidney disease   . Neuromuscular disorder ([place])     Medications:  Scheduled:  . antiseptic oral rinse  7 mL Mouth Rinse 10 times per day  . budesonide (PULMICORT) nebulizer solution  0.5 mg Nebulization BID  . chlorhexidine gluconate  15 mL Mouth Rinse BID  . clonazePAM  0.5 mg Per Tube BID  . collagenase   Topical Daily  . escitalopram  5 mg Per Tube Daily  . famotidine  20 mg Per Tube BID  . feeding supplement (PRO-STAT SUGAR FREE 64)  30 mL Per Tube BID  . free water  200 mL Per Tube 6 X Daily  . insulin aspart  0-20 Units Subcutaneous 6 times per day  . insulin glargine  20 Units Subcutaneous Daily  . levalbuterol  0.63 mg Nebulization Q6H  . metoprolol tartrate  100 mg Per Tube BID  . QUEtiapine  25 mg Oral BID  . senna-docusate  1 tablet Oral BID  . sodium chloride  10-40 mL Intracatheter Q12H   Infusions:  . feeding supplement (VITAL HIGH PROTEIN) 1,000 mL  (07/14/15 0700)  . fentaNYL infusion INTRAVENOUS 150 mcg/hr (07/14/15 1030)    Assessment: Pharmacy consulted to assist in constipation prevention in this 69 y/o F with SCLC.   Plan:  Patient with BM 12/18. Will continue current regimen of senna-docusate 1 tab po bid and f/u AM.    Ulice Dash, PharmD  Clinical Pharmacist 07/14/2015 ,11:46 AM

## 2015-07-14 NOTE — Progress Notes (Signed)
Patient's next round of chemotherapy, which would be cycle 3, could be initiated as soon as tomorrow. By report, there is a family meeting today with palliative care to determine whether to pursue comfort measures or continue to be aggressive. Will delay any further chemotherapy until a decision has been reached.  Will follow.

## 2015-07-14 NOTE — Care Management (Signed)
Palliative care has met with patient's husband and sister.  Patient is now  a DNR and it has been determined that will not pursue any more chemo.  A terminal vent wean will be discussed tomorrow.

## 2015-07-15 ENCOUNTER — Inpatient Hospital Stay: Payer: Medicare Other

## 2015-07-15 LAB — GLUCOSE, CAPILLARY
Glucose-Capillary: 120 mg/dL — ABNORMAL HIGH (ref 65–99)
Glucose-Capillary: 121 mg/dL — ABNORMAL HIGH (ref 65–99)
Glucose-Capillary: 89 mg/dL (ref 65–99)
Glucose-Capillary: 92 mg/dL (ref 65–99)

## 2015-07-15 LAB — BASIC METABOLIC PANEL
Anion gap: 7 (ref 5–15)
BUN: 65 mg/dL — ABNORMAL HIGH (ref 6–20)
CALCIUM: 8.7 mg/dL — AB (ref 8.9–10.3)
CO2: 30 mmol/L (ref 22–32)
CREATININE: 0.83 mg/dL (ref 0.44–1.00)
Chloride: 113 mmol/L — ABNORMAL HIGH (ref 101–111)
GFR calc non Af Amer: >60 mL/min (ref >60)
Glucose, Bld: 124 mg/dL — ABNORMAL HIGH (ref 65–99)
Potassium: 4.8 mmol/L (ref 3.5–5.1)
SODIUM: 150 mmol/L — AB (ref 135–145)

## 2015-07-15 MED ORDER — MORPHINE 100MG IN NS 100ML (1MG/ML) PREMIX INFUSION
3.0000 mg/h | INTRAVENOUS | Status: DC
  Filled 2015-07-15: qty 100

## 2015-07-15 MED ORDER — LORAZEPAM 2 MG/ML IJ SOLN: 2.0000 mg | INTRAMUSCULAR | Status: DC | PRN

## 2015-07-15 MED ORDER — MORPHINE 100MG IN NS 100ML (1MG/ML) PREMIX INFUSION
3.0000 mg/h | INTRAVENOUS | Status: DC
  Administered 2015-07-15: 3 mg/h via INTRAVENOUS

## 2015-07-15 MED ORDER — FENTANYL CITRATE (PF) 100 MCG/2ML IJ SOLN: 100.0000 ug | Freq: Once | INTRAMUSCULAR | Status: DC

## 2015-07-15 MED ORDER — FENTANYL BOLUS VIA INFUSION
50.0000 ug | INTRAVENOUS | Status: DC | PRN
  Filled 2015-07-15: qty 100

## 2015-07-27 NOTE — Progress Notes (Signed)
Upon assessment patient very restless, agitated, pulling at medical equipment, throwing legs off side of bed.  Nurse attempted to orient and reposition patient for more comfort with no success. 07:30 Ativan '2mg'$  IV administered.

## 2015-07-27 NOTE — Consult Note (Signed)
Electrolyte CONSULT NOTE - FOLLOW UP  Pharmacy Consult for Electrolyte monitoring and replacement  Patient Measurements: Height: '5\' 7"'$  (170.2 cm) Weight: 213 lb 3 oz (96.7 kg) IBW/kg (Calculated) : 61.6  Vital Signs: Temp: 99 F (37.2 C) (12/20 0357) Temp Source: Axillary (12/20 0357) BP: 110/54 mmHg (12/20 0300) Pulse Rate: 71 (12/20 0300)  Recent Labs  07/13/15 0459 07/13/15 1229 07/14/15 0510 2015/07/18 0451  NA 145 147* 146* 150*  K 4.0 4.1 4.9 4.8  CL 111 110 113* 113*  CO2 '29 29 28 30  '$ GLUCOSE 121* 76 219* 124*  BUN 48* 47* 57* 65*  CREATININE 0.79 0.68 0.96 0.83  CALCIUM 8.4* 8.6* 8.6* 8.7*  MG 1.5*  --  2.2  --   PHOS 3.9  --   --   --   PROT  --  5.8*  --   --   ALBUMIN  --  2.5*  --   --   AST  --  29  --   --   ALT  --  30  --   --   ALKPHOS  --  134*  --   --   BILITOT  --  0.9  --   --    Estimated Creatinine Clearance: 78.5 mL/min (by C-G formula based on Cr of 0.83).   Recent Labs  07/14/15 2021 18-Jul-2015 0005 18-Jul-2015 0408  GLUCAP 123* 120* 121*    Assessment: 70 yo female with SCLC current;y intubated in ICU, s/p trach, s/p PEG-difficult to wean from vent. Pharmacy consulted for monitoring and managing electrolytes.    Plan:  Labs are wnl except for phos which is low and has been replaced with 20 mmol Kphos. Will f/u am labs.  12/15 AM Mg 1.5, PO4 2.4. Replacement electrolytes ordered by MD. Will recheck with tomorrow AM labs.   12/16 AM electrolytes WNL. Recheck BMP in AM.  12/17  AM electrolytes WNL. Will recheck labs in am.  12/18 AM Mg 1.5. 4 grams magnesium sulfate IV x1 ordered. Recheck BMP and Mg in AM.  12/19 AM electrolytes WNL. Recheck BMP in AM.  1220 AM Na 150, other electrolytes WNL. Will recheck in AM.   Ovid Curd A. [place], [place].D., BCPS Clinical Pharmacist 07-18-15 5:26 AM

## 2015-07-27 NOTE — Progress Notes (Signed)
Morphine drip increased per Dr. Megan Salon verbal order.

## 2015-07-27 NOTE — Progress Notes (Signed)
CDS referral number# [id number].  Patient not candidate for organ donation.

## 2015-07-27 NOTE — Progress Notes (Signed)
Pastoral Care and prayer provided.

## 2015-07-27 NOTE — Progress Notes (Signed)
Pharmacy Consult for Constipation Prevention   Allergies  Allergen Reactions  . Atorvastatin Other (See Comments)    Does not remember why she had Intolerance to Lipitor.  . Rosuvastatin Anxiety    Chest tigtness and feeling as if she had heartburn.    Patient Measurements: Height: '5\' 7"'$  (170.2 cm) Weight: 214 lb 1.1 oz (97.1 kg) IBW/kg (Calculated) : 61.6   Vital Signs: Temp: 98.9 F (37.2 C) (12/20 1100) Temp Source: Oral (12/20 1100) BP: 115/62 mmHg (12/20 1200) Pulse Rate: 98 (12/20 1200) Intake/Output from previous day: 12/19 0701 - 12/20 0700 In: 2801.8 [I.V.:421.8; NG/GT:2380] Out: 1725 [Urine:1725] Intake/Output from this shift: Total I/O In: 864.5 [I.V.:104.5; NG/GT:760] Out: 450 [Urine:450]   Medical History: Past Medical History  Diagnosis Date  . Anxiety   . Arthritis   . COPD (chronic obstructive pulmonary disease) ([place])   . CHF (congestive heart failure) ([place])   . Hyperlipidemia   . Hypertension   . Diabetes mellitus without complication ([place])   . Chronic kidney disease   . Neuromuscular disorder ([place])     Medications:  Scheduled:  . antiseptic oral rinse  7 mL Mouth Rinse 10 times per day  . budesonide (PULMICORT) nebulizer solution  0.5 mg Nebulization BID  . chlorhexidine gluconate  15 mL Mouth Rinse BID  . clonazePAM  0.5 mg Per Tube BID  . escitalopram  5 mg Per Tube Daily  . famotidine  20 mg Per Tube BID  . feeding supplement (PRO-STAT SUGAR FREE 64)  30 mL Per Tube BID  . fentaNYL (SUBLIMAZE) injection  100 mcg Intravenous Once  . free water  200 mL Per Tube 6 X Daily  . insulin aspart  0-20 Units Subcutaneous 6 times per day  . insulin glargine  20 Units Subcutaneous Daily  . levalbuterol  0.63 mg Nebulization Q6H  . metoprolol tartrate  100 mg Per Tube BID  . QUEtiapine  25 mg Oral BID  . senna-docusate  1 tablet Oral BID  . sodium chloride  10-40 mL Intracatheter Q12H   Infusions:  . feeding supplement (VITAL HIGH PROTEIN)  1,000 mL (07/14/15 1913)  . 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   Plan:  Patient with [facility] 12/19. Will continue current regimen of senna-docusate 1 tab po bid and f/u AM.    Ulice Dash, PharmD  Clinical Pharmacist 2015-08-03 ,12:48 PM

## 2015-07-27 NOTE — Progress Notes (Signed)
Patient with no improvement after ativan given.  Morphine '2mg'$  IV given for pain.

## 2015-07-27 NOTE — Progress Notes (Signed)
Speech Therapy Note: reviewed chart notes; pt remains on vent support via trach and TFs via PEG for nutrition/hydration. ST services will be available for any further assessment/tx when pt's medical and mental status' improve for safe participation.

## 2015-07-27 NOTE — Progress Notes (Signed)
Fentanyl 189mg given from bad per order for discomfort [facility]

## 2015-07-27 NOTE — Progress Notes (Addendum)
Death Pronouncement   Pt passed away peacefully --about 2 hours after vent removal.  Family was bedside and death was expected.  They were pleased that she did not suffer in the dying process and also that her suffering that she has had for the last 2 months is over.  She had no heart sounds, no lung sounds, no respirations or pulses, and pupils were fixed and dilated.  She is pronounced dead at 4:16 pm today.  (Death Certificate will go to attending MD)   Colleen Can MD  Palliative Care

## 2015-07-27 NOTE — Progress Notes (Signed)
As per Dr. Megan Salon after family meeting patient will be comfort care status.

## 2015-07-27 NOTE — Progress Notes (Signed)
Patient continues to be agitated, restless, pulling at medical equipment, sliding self out of bed.  Dr. Shawna Orleans notified and orders noted.  08:30 fentanyl 100 mcg IV bolus given from current handing bag.  Fentanyl drip increased to 200 mcg/hr.

## 2015-07-27 NOTE — Progress Notes (Signed)
Palliative Medicine Inpatient Consult Follow Up Note   Name: Emily Pratt Date: 07/16/15 MRN: [id number]  DOB: July 29, 1945  Referring Physician: Wilhelmina Mcardle, MD  Palliative Care consult requested for this 70 y.o. female for goals of medical therapy in patient with vent dependent resp flre.  TODAY'S DISCUSSIONS AND DECISIONS: Mr Bach has spoken with pts two sons and other relatives (grandchildren, nieces, etc).  Five younger relatives are here (not the two sons) in addition to Mr Samarin.  He has resigned himself that she should not suffer any longer and he is in agreement with discontinuation of ventilator and other support including feedings etc.    This is being done at this time.  Family is bedside.  Chaplain is present. Bereavement tray ordered.   See orders.  I will continue to be bedside as needed for family (for supportive discussions).   Pain/ Dyspnea orders placed by me today also.  Pt is requiring high dose Fentanyl and also a morphine drip which is being titrated.     IMPRESSION: END STAGE Vent dependent lung disease due to COPD and lung cancer ---small cell lung ca  ---she got two rounds of chemo ---cannot get radiation on vent ---has failed weaning from vent Anemia Thrompocytopenia Leukopenia -resolved Delerium with agitation PEG tube dependent for nutrition S/P trache PSVT / AFIB --now NSR Hypernatremia Hypervolemia   REVIEW OF SYSTEMS:  Patient is not able to provide ROS due to critical illness, sedation.  CODE STATUS: DNR   PAST MEDICAL HISTORY: Past Medical History  Diagnosis Date  . Anxiety   . Arthritis   . COPD (chronic obstructive pulmonary disease) ([place])   . CHF (congestive heart failure) ([place])   . Hyperlipidemia   . Hypertension   . Diabetes mellitus without complication ([place])   . Chronic kidney disease   . Neuromuscular disorder ([place])     PAST SURGICAL HISTORY:  Past Surgical History  Procedure Laterality Date  . Joint replacement  Bilateral 2006    both knees  . Ankle surgery    . Tracheostomy tube placement N/A 05/28/2015    Procedure: TRACHEOSTOMY;  Surgeon: Margaretha Sheffield, MD;  Location: [facility] ORS;  Service: ENT;  Laterality: N/A;  . Peg placement N/A 07/26/2015    Procedure: PERCUTANEOUS ENDOSCOPIC GASTROSTOMY (PEG) PLACEMENT;  Surgeon: Josefine Class, MD;  Location: [facility] ENDOSCOPY;  Service: Endoscopy;  Laterality: N/A;    Vital Signs: BP 115/62 mmHg  Pulse 98  Temp(Src) 98.9 F (37.2 C) (Oral)  Resp 14  Ht '5\' 7"'$  (1.702 m)  Wt 97.1 kg (214 lb 1.1 oz)  BMI 33.52 kg/m2  SpO2 97% Filed Weights   07/13/15 0452 07/14/15 0400 2015-07-16 0634  Weight: 97.2 kg (214 lb 4.6 oz) 96.7 kg (213 lb 3 oz) 97.1 kg (214 lb 1.1 oz)    Estimated body mass index is 33.52 kg/(m^2) as calculated from the following:   Height as of this encounter: '5\' 7"'$  (1.702 m).   Weight as of this encounter: 97.1 kg (214 lb 1.1 oz).  PHYSICAL EXAM: Sedated and vented via trache She opens eyes wide but does not focus --then sticks tongue out and twists head from side to side  ---these are all nonpurposeful EOMI OP dry Neck no JVD or Tm Hrt rrr no m Lungs with ronchi Abd soft and NT Ext with old stasis changes but no mottling or cyanosis as yet  LABS: CBC:    Component Value Date/Time   WBC 5.3 07/13/2015 1229  HGB 7.2* 07/13/2015 1229   HCT 22.6* 07/13/2015 1229   PLT 135* 07/13/2015 1229   MCV 93.4 07/13/2015 1229   NEUTROABS 3.7 07/13/2015 1229   LYMPHSABS 1.0 07/13/2015 1229   MONOABS 0.5 07/13/2015 1229   EOSABS 0.0 07/13/2015 1229   BASOSABS 0.0 07/13/2015 1229   Comprehensive Metabolic Panel:    Component Value Date/Time   NA 150* 2015/08/06 0451   NA 143 11/14/2014   K 4.8 08-06-15 0451   CL 113* 08-06-15 0451   CO2 30 06-Aug-2015 0451   BUN 65* 2015-08-06 0451   BUN 21 11/14/2014   CREATININE 0.83 Aug 06, 2015 0451   CREATININE 17.0* 11/14/2014   GLUCOSE 124* 08/06/2015 0451   CALCIUM 8.7* Aug 06, 2015  0451   AST 29 07/13/2015 1229   ALT 30 07/13/2015 1229   ALKPHOS 134* 07/13/2015 1229   BILITOT 0.9 07/13/2015 1229   PROT 5.8* 07/13/2015 1229   ALBUMIN 2.5* 07/13/2015 1229    More than 50% of the visit was spent in counseling/coordination of care: YES  Time Spent:  65 min

## 2015-07-27 NOTE — Progress Notes (Addendum)
Discussed on multidisciplinary rounds and discussed with Dr Megan Salon. I am very supportive of the plan for discontinuation of vent support and full comfort care.   Merton Border, MD PCCM service Mobile [phone] Pager [phone]

## 2015-07-27 NOTE — Progress Notes (Signed)
Husband informed that nursing staff unable to retrieve personal belongings from hospital safe as the patient has  written note that is signed by deceased patient that personal belonging in hospital safe to be given to [employer]).  [facility] called and notified.  [facility] Safe verbalized that she would stop by with ID tomorrow 07/15/18 to pick up items from safe.  Silvana Newness nursing supervisor called and notified of situation.  Safe ticket with key and letter to be attached to funeral release form and given to nursing supervisor.

## 2015-07-27 NOTE — Discharge Summary (Addendum)
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 She reportedly had oxygen saturations as low as 70% when EMS arrived. The patient was on BiPAP at that time. FiO2 was increased to 100% and oxygen saturations improved to only 79%. In the emergency department patient was immediately initiated on BiPAP. She had progressively become more somnolent. She denied chest pain or fever. Upon my exam in the emergency department the patient indicated by nodding her head that she felt better with BiPAP in place. Chest x-ray showed worsening pleural effusion. Due to respiratory failure emergency department staff called the hospitalist service for admission  HOSPITAL COURSE:   June 15, 2015 admitted to [facility] hospitalist service  06/15/2015 seen in consultation by PCCM 05/20/15 intubated after failed BiPAP. CVL placed 05/20/15 CT chest: Obstructing central mass involving the right lung with occlusion of the right upper lobe bronchus and the bronchus intermedius with complete atelectasis of the right lung 05/20/15: Bronchoscopy with EBBx:LUNG, RIGHT UPPER LOBE; BIOPSY: - SMALL CELL CARCINOMA 05/20/15 Renal consultation 05/21/15: LV EF: 30% -  35% 05/21/15 Cardiology consultation 05/21/15 Oncology consultation - chemotherapy initiated 05/26/15 05/21/15: Renal US: No  hydronephrosis 05/26/15 initial Palliative Care consultation 06/02/15: [place] chest: Moderate right pleural effusion 06/06/15 CT chest: Interval response to therapy 06/07/15 Extubated to intermittent BiPAP 06/09/15 re-intubated 06/05/2015 Tracheostomy tube placement 06/04/2015: repeat bronchoscopy - improved airway patency 06/19/15: repeat bronchoscopy for mucus plugging 06/20/15 CT head: NAD 06/22/15 repeat bronchoscopy - Right Lung - moderate thick mucus secretions 07/24/2015 PEG placed  Despite prolonged and complicated hospitalization as documented above, pt remained fully vent dependent with increasing discomfort. Discussions between Palliative Care and family on 07/14/15 led ot decision to withdraw vent support and proceed with full comfort care. This was undertaken on August 11, 2015. She passed away peacefully shortly thereafter  Cause of death:  Small cell cancer of the lung  Contributing factors: COPD Cardiomyopathy   Autopsy:  No   Merton Border, MD;  PCCM service; Mobile [phone]

## 2015-07-27 NOTE — Consult Note (Signed)
Palliative Medicine Inpatient Consult Note   Name: Emily Pratt Date: 08-06-15 MRN: [id number]  DOB: 1946/02/13  Referring Physician: Wilhelmina Mcardle, MD  Palliative Care consult requested for this 70 y.o. female for goals of medical therapy in patient with ventilator dependent chronic respiratory failure due to COPD and lung cancer.     TODAY'S DISCUSSIONS AND DECISIONS: I had a long talk with pt's husband and pts Emily Pratt.  I reviewed the pts hospital course and her diagnoses.  I went over the improbability of her ever improving. I talked about how often the pt begs for all of this to stop.  Emily Pratt was very helpful in directing the conversation toward what the pt would want at this point.  She was able to help pt's tearful husband to express his own emotions and this enabled him to talk more openly about how sick she is and how he doesn't think she would want to live like this.    The patient herself has been saying for several weeks now that she does not want 'all this done'. She has voiced "Let Me Die" and "Stop doing all these things to me". She did not want to be placed back on the vent early on but Emily Pratt did not think she was in her right mind and he asked that she be put back on the vent. He now feels that she is miserable and should not be made to continue living like this.   Pt goes from 'zero to ten' in terms of either being unresponsive or swinging her fists, per nursing. Emily Pratt would like her to be awake tomorrow so he can try to talk to her.  He agreed today to DNR and also to no more chemo.   Tomorrow, at 1:30 pm, we will meet and discuss terminal withdrawal from the ventilator. This is the plan he and Emily Pratt agreed with.  IMPRESSION END STAGE Vent dependent lung disease due to COPD and lung cancer ---small cell lung ca  ---she got two rounds of chemo ---cannot get radiation on vent ---has failed weaning from vent Anemia Thrompocytopenia Leukopenia   -resolved Delerium with agitation PEG tube dependent for nutrition S/P trache PSVT / AFIB --now NSR Hypernatremia Hypervolemia R Pleural effusion     REVIEW OF SYSTEMS:  Patient is not able to provide ROS due to sedation (and confusion when awake)  SPIRITUAL SUPPORT SYSTEM: Yes  -husband and Emily Pratt primarily.  SOCIAL HISTORY:  reports that she has been smoking Cigarettes.  She does not have any smokeless tobacco history on file. She reports that she does not drink alcohol or use illicit drugs.  LEGAL DOCUMENTS:  I placed a DNR form in the chart  CODE STATUS: Full code   -changed to DNR today  PAST MEDICAL HISTORY: Past Medical History  Diagnosis Date  . Anxiety   . Arthritis   . COPD (chronic obstructive pulmonary disease) ([place])   . CHF (congestive heart failure) ([place])   . Hyperlipidemia   . Hypertension   . Diabetes mellitus without complication ([place])   . Chronic kidney disease   . Neuromuscular disorder ([place])     PAST SURGICAL HISTORY:  Past Surgical History  Procedure Laterality Date  . Joint replacement Bilateral 2006    both knees  . Ankle surgery    . Tracheostomy tube placement N/A 06/04/2015    Procedure: TRACHEOSTOMY;  Surgeon: Margaretha Sheffield, MD;  Location: [facility] ORS;  Service: ENT;  Laterality: N/A;  . Peg  placement N/A 07/19/2015    Procedure: PERCUTANEOUS ENDOSCOPIC GASTROSTOMY (PEG) PLACEMENT;  Surgeon: Josefine Class, MD;  Location: [facility] ENDOSCOPY;  Service: Endoscopy;  Laterality: N/A;    ALLERGIES:  is allergic to atorvastatin and rosuvastatin.  MEDICATIONS:  Current Facility-Administered Medications  Medication Dose Route Frequency Provider Last Rate Last Dose  . acetaminophen (TYLENOL) tablet 650 mg  650 mg Oral Q6H PRN Emily Foreman, MD   650 mg at 07/09/15 0507   Or  . acetaminophen (TYLENOL) suppository 650 mg  650 mg Rectal Q6H PRN Emily Foreman, MD      . antiseptic oral rinse solution (CORINZ)  7 mL Mouth Rinse 10 times per  day Emily Foreman, MD   7 mL at Jul 17, 2015 1234  . budesonide (PULMICORT) nebulizer solution 0.5 mg  0.5 mg Nebulization BID Flora Lipps, MD   0.5 mg at 07-17-2015 0809  . chlorhexidine gluconate (PERIDEX) 0.12 % solution 15 mL  15 mL Mouth Rinse BID Emily Foreman, MD   15 mL at 17-Jul-2015 0806  . clonazePAM (KLONOPIN) tablet 0.5 mg  0.5 mg Per Tube BID Vilinda Boehringer, MD   0.5 mg at 07-17-15 0935  . escitalopram (LEXAPRO) tablet 5 mg  5 mg Per Tube Daily Flora Lipps, MD   5 mg at 17-Jul-2015 0935  . famotidine (PEPCID) 40 MG/5ML suspension 20 mg  20 mg Per Tube BID Emily Mcardle, MD   20 mg at 2015-07-17 0934  . feeding supplement (PRO-STAT SUGAR FREE 64) liquid 30 mL  30 mL Per Tube BID Laverle Hobby, MD   30 mL at 07/17/15 0934  . feeding supplement (VITAL HIGH PROTEIN) liquid 1,000 mL  1,000 mL Per Tube Continuous Laverle Hobby, MD 60 mL/hr at 07/14/15 1913 1,000 mL at 07/14/15 1913  . fentaNYL (SUBLIMAZE) injection 100 mcg  100 mcg Intravenous Once Emily Mcardle, MD   100 mcg at 07-17-15 2841  . fentaNYL 2544mg in NS 2556m(1025mml) infusion-PREMIX  25-400 mcg/hr Intravenous Continuous DavWilhelmina Pratt 22.5 mL/hr at 07/06/21/201637 225 mcg/hr at 06/2015-06-2236  . free water 200 mL  200 mL Per Tube 6 X Daily DavWilhelmina Pratt   200 mL at 12/12-22-201600  . heparin lock flush 100 unit/mL  500 Units Intracatheter Once PRN TimLloyd HugerD      . heparin lock flush 100 unit/mL  500 Units Intracatheter Once PRN TimLloyd HugerD      . heparin lock flush 100 unit/mL  250 Units Intracatheter Once PRN TimLloyd HugerD      . insulin aspart (novoLOG) injection 0-20 Units  0-20 Units Subcutaneous 6 times per day DavWilhelmina Pratt   3 Units at 06/2015/12/224469-581-3261 insulin glargine (LANTUS) injection 20 Units  20 Units Subcutaneous Daily DavWilhelmina Pratt   20 Units at 12/12-22-201600  . levalbuterol (XOPENEX) nebulizer solution 0.63 mg  0.63 mg Nebulization Q6H DavWilhelmina Pratt   0.63 mg at 07/06/21/201609  . LORazepam (ATIVAN) injection 2 mg  2 mg Intravenous Q4H PRN KurFlora LippsD   2 mg at 12/12-22-1630  . metoprolol (LOPRESSOR) tablet 100 mg  100 mg Per Tube BID VisVilinda BoehringerD   100 mg at 06/2015-06-2234  . morphine 2 MG/ML injection 2 mg  2 mg Intravenous Q2H PRN KurFlora LippsD   2 mg at 12/Dec 22, 201648  . polyethylene glycol (MIRALAX /  GLYCOLAX) packet 17 g  17 g Oral Daily PRN Laverle Hobby, MD   17 g at 07/10/15 0953  . QUEtiapine (SEROQUEL) tablet 25 mg  25 mg Oral BID Flora Lipps, MD   25 mg at Jul 26, 2015 0935  . senna-docusate (Senokot-S) tablet 1 tablet  1 tablet Oral BID Emily Mcardle, MD   1 tablet at 07/26/15 0935  . sodium chloride 0.9 % injection 10 mL  10 mL Intracatheter PRN Lloyd Huger, MD      . sodium chloride 0.9 % injection 10 mL  10 mL Intracatheter PRN Lloyd Huger, MD      . sodium chloride 0.9 % injection 10 mL  10 mL Intracatheter PRN Lloyd Huger, MD      . sodium chloride 0.9 % injection 10-40 mL  10-40 mL Intracatheter Q12H Flora Lipps, MD   10 mL at 07-26-15 0936  . sodium chloride 0.9 % injection 10-40 mL  10-40 mL Intracatheter PRN Flora Lipps, MD      . sodium chloride 0.9 % injection 3 mL  3 mL Intravenous PRN Lloyd Huger, MD      . sodium chloride 0.9 % injection 3 mL  3 mL Intravenous PRN Lloyd Huger, MD      . sodium chloride 0.9 % injection 3 mL  3 mL Intravenous PRN Lloyd Huger, MD        Vital Signs: BP 115/62 mmHg  Pulse 98  Temp(Src) 98.9 F (37.2 C) (Oral)  Resp 14  Ht '5\' 7"'$  (1.702 m)  Wt 97.1 kg (214 lb 1.1 oz)  BMI 33.52 kg/m2  SpO2 97% Filed Weights   07/13/15 0452 07/14/15 0400 07-26-2015 0634  Weight: 97.2 kg (214 lb 4.6 oz) 96.7 kg (213 lb 3 oz) 97.1 kg (214 lb 1.1 oz)    Estimated body mass index is 33.52 kg/(m^2) as calculated from the following:   Height as of this encounter: '5\' 7"'$  (1.702 m).   Weight as of this encounter: 97.1 kg (214 lb 1.1  oz).  PERFORMANCE STATUS (ECOG) : 4 - Bedbound  PHYSICAL EXAM: Lying with head to right side in ICU bed Sedated, Vented via trache, and with PEG tube supplying enteral nutrition Eyes closed  OP moist anteriorly No JVD or TM Hrt rrr no m Lungs with ronchi --vented Abd soft and NT Ext she has some old venous stasis pigment changes No mottling or cyanosis  LABS: CBC:    Component Value Date/Time   WBC 5.3 07/13/2015 1229   HGB 7.2* 07/13/2015 1229   HCT 22.6* 07/13/2015 1229   PLT 135* 07/13/2015 1229   MCV 93.4 07/13/2015 1229   NEUTROABS 3.7 07/13/2015 1229   LYMPHSABS 1.0 07/13/2015 1229   MONOABS 0.5 07/13/2015 1229   EOSABS 0.0 07/13/2015 1229   BASOSABS 0.0 07/13/2015 1229   Comprehensive Metabolic Panel:    Component Value Date/Time   NA 150* 2015/07/26 0451   NA 143 11/14/2014   K 4.8 2015/07/26 0451   CL 113* 07/26/2015 0451   CO2 30 07-26-15 0451   BUN 65* 2015/07/26 0451   BUN 21 11/14/2014   CREATININE 0.83 Jul 26, 2015 0451   CREATININE 17.0* 11/14/2014   GLUCOSE 124* 2015/07/26 0451   CALCIUM 8.7* 2015/07/26 0451   AST 29 07/13/2015 1229   ALT 30 07/13/2015 1229   ALKPHOS 134* 07/13/2015 1229   BILITOT 0.9 07/13/2015 1229   PROT 5.8* 07/13/2015 1229   ALBUMIN 2.5* 07/13/2015 1229  More than 50% of the visit was spent in counseling/coordination of care: Yes  Time Spent: 80  minutes

## 2015-07-27 DEATH — deceased

## 2015-10-10 ENCOUNTER — Other Ambulatory Visit: Payer: Self-pay | Admitting: Nurse Practitioner

## 2016-09-14 IMAGING — CR DG CHEST 1V
1 series · 1 of 1 positions shown · non-contrast
Comparison: Portable chest x-ray May 28, 2015

CLINICAL DATA: Dyspnea, respiratory failure, COPD, lung mass

EXAM:
CHEST 1 VIEW

[portable]
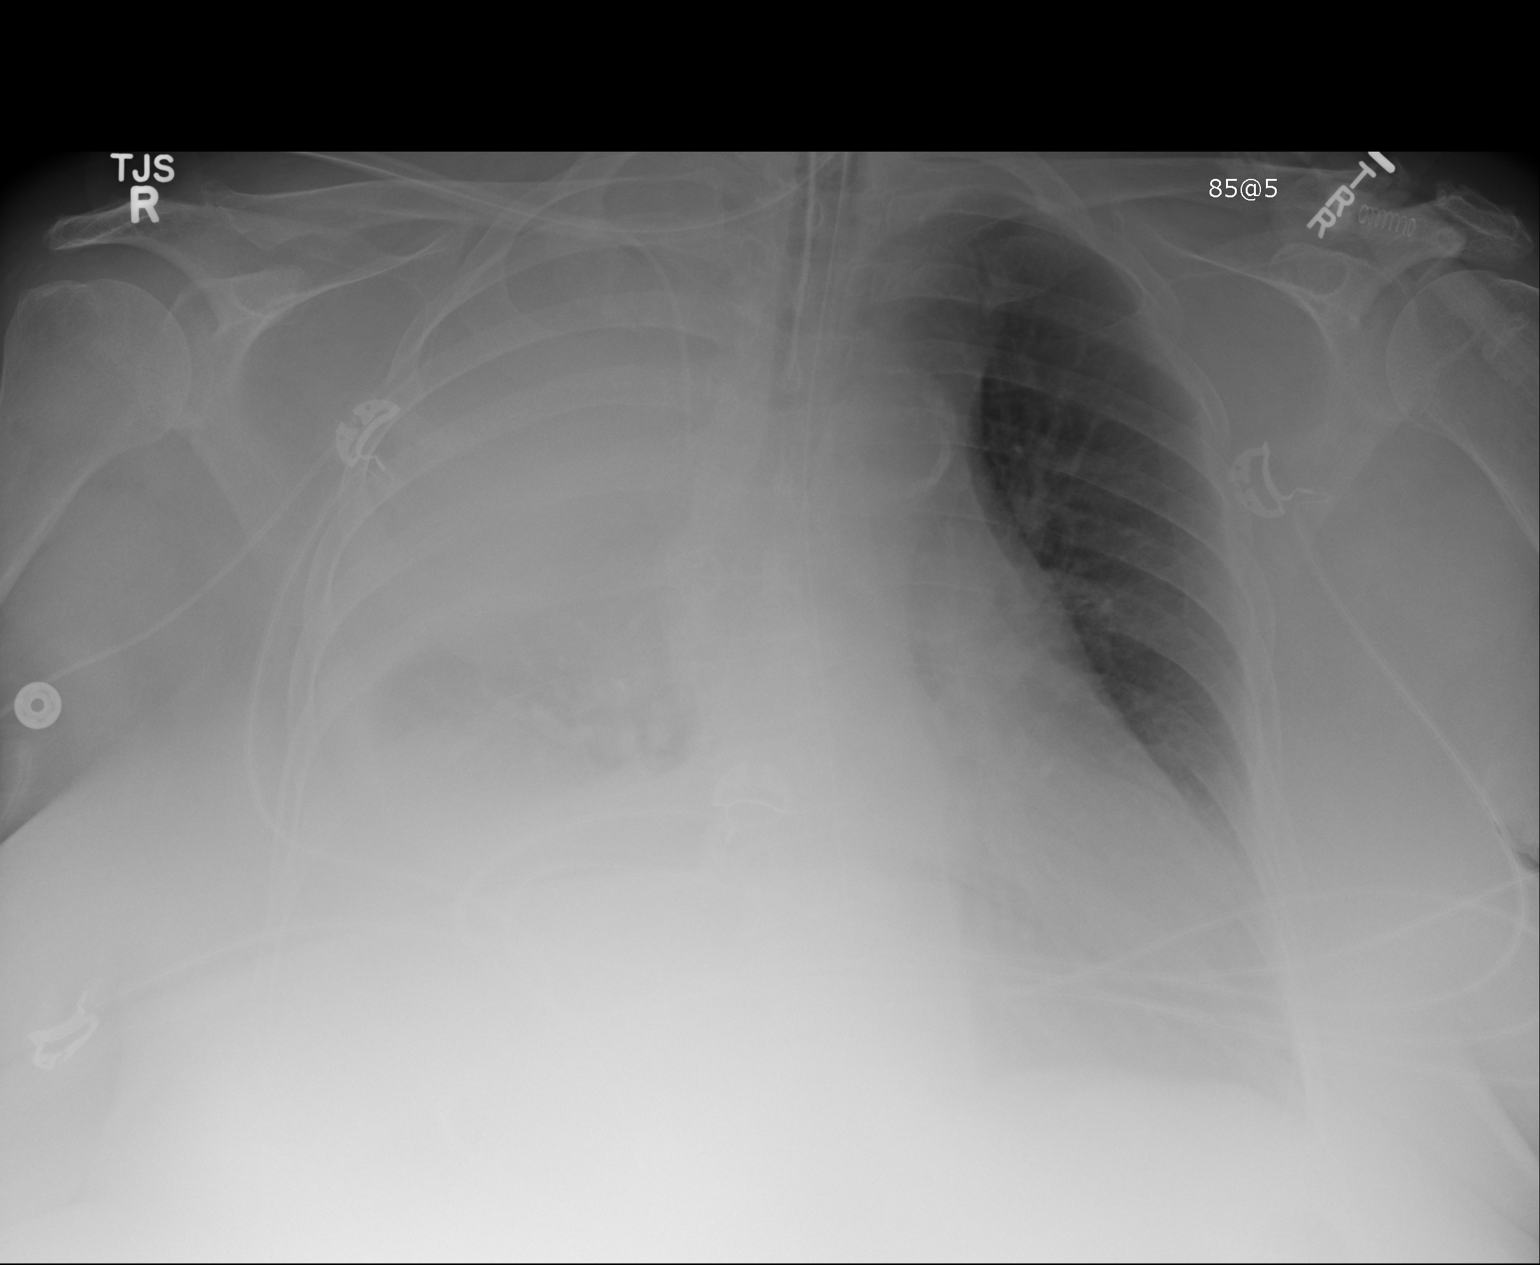

[1 of 1 positions shown; findings below may reference images not displayed]

FINDINGS: There is persistent near total atelectasis of the right lung with
some aeration noted in the lower hemi thorax. The left lung is
clear. There is no significant mediastinal shift. The cardiac
silhouette is normal in size. The endotracheal tube tip lies 4.6 cm
above the carina. The esophagogastric tube tip projects below the
inferior margin of the image. The right-sided PICC line tip projects
over the midportion of the SVC.
IMPRESSION: Persistent consolidation of the mid and upper right lung with small
amount of residual aerated right lower lung. The left lung is clear.
The support tubes are in reasonable position.

## 2016-09-15 IMAGING — CR DG CHEST 1V
1 series · 1 of 1 positions shown · non-contrast
Comparison: 05/29/2015.  CT 05/20/2015 .

CLINICAL DATA: Shortness of breath.

EXAM:
CHEST 1 VIEW

[portable]
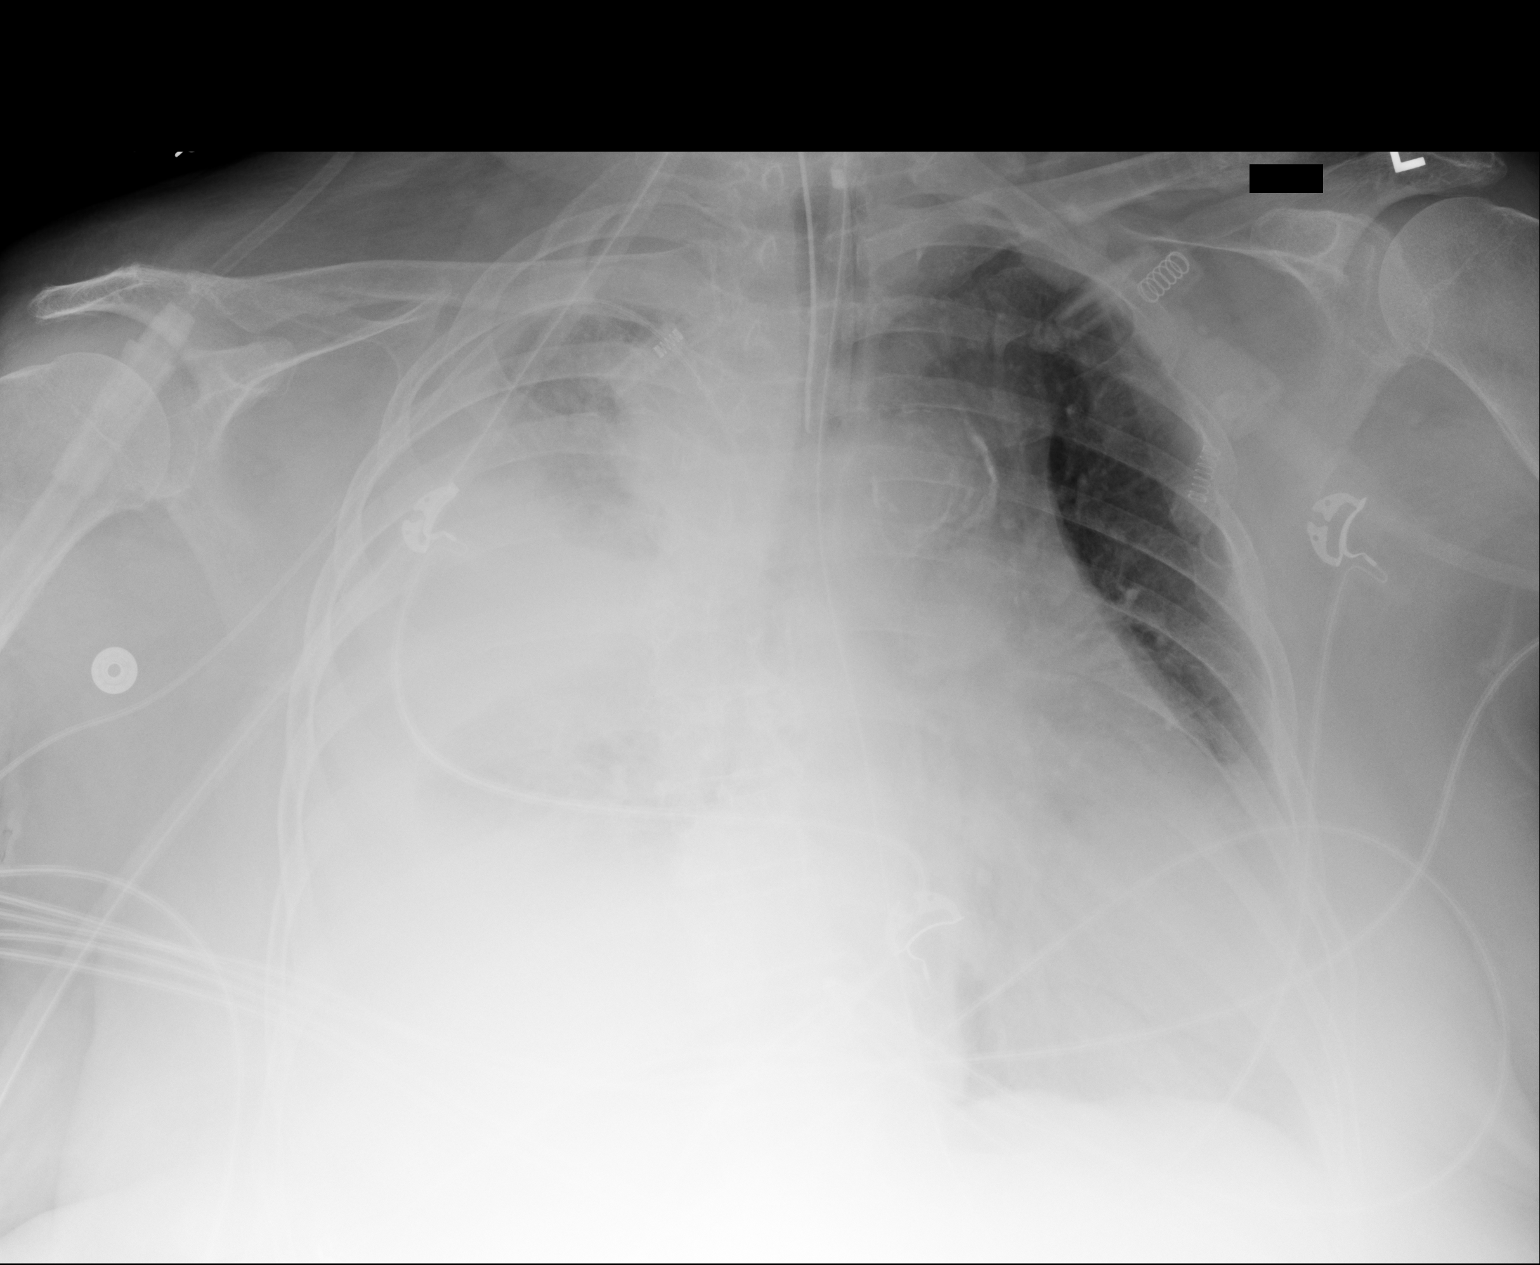

[1 of 1 positions shown; findings below may reference images not displayed]

FINDINGS: Endotracheal tube, NG tube, right PICC line in stable position.
Persistent consolidation of the right lung and right pleural
effusion again noted . Heart size stable. No pneumothorax. No acute
osseous abnormality.
IMPRESSION: 1. Lines and tubes in stable position.
2. Persistent consolidation of the right lung with prominent right
pleural effusion. No interim change from prior exam.

## 2016-09-16 IMAGING — CR DG CHEST 1V
1 series · 2 of 2 positions shown · non-contrast
Comparison: 05/30/2015

CLINICAL DATA: Dyspnea.

EXAM:
CHEST 1 VIEW

[Series 1: portable · 0.17mm/px · 2 of 2 slices shown]
[im 1/2]
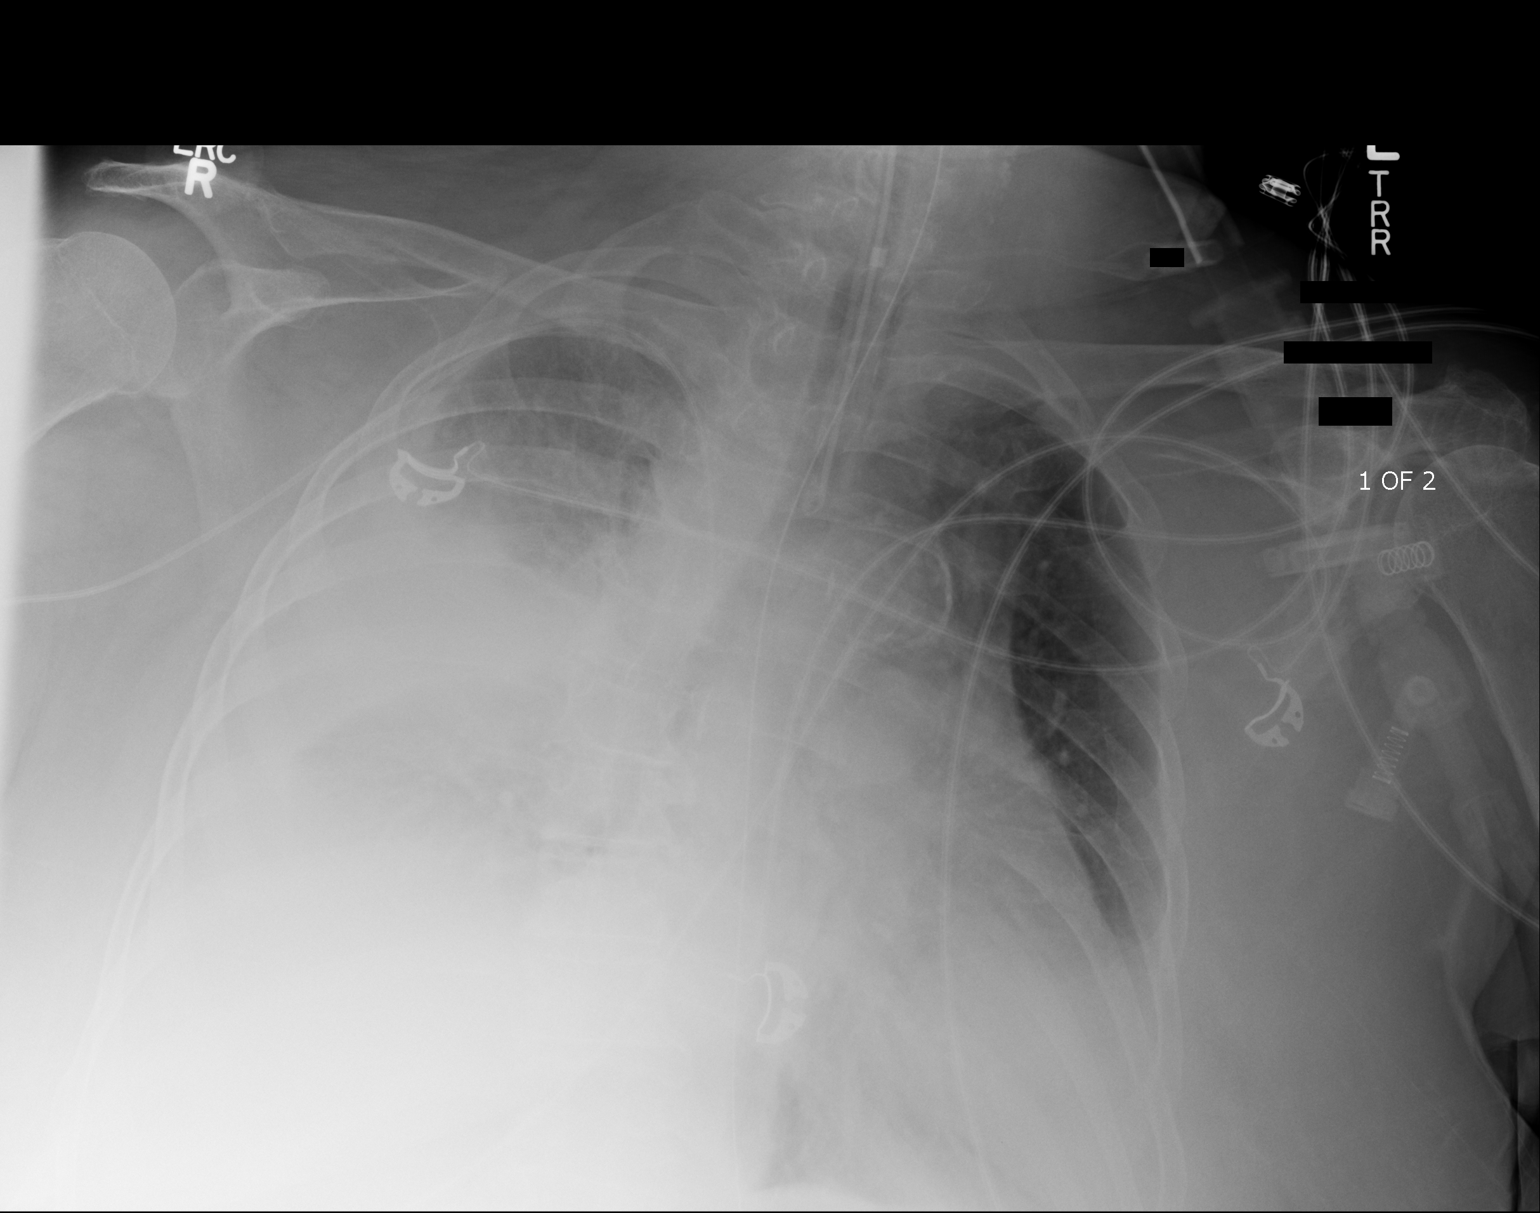
[im 2/2]
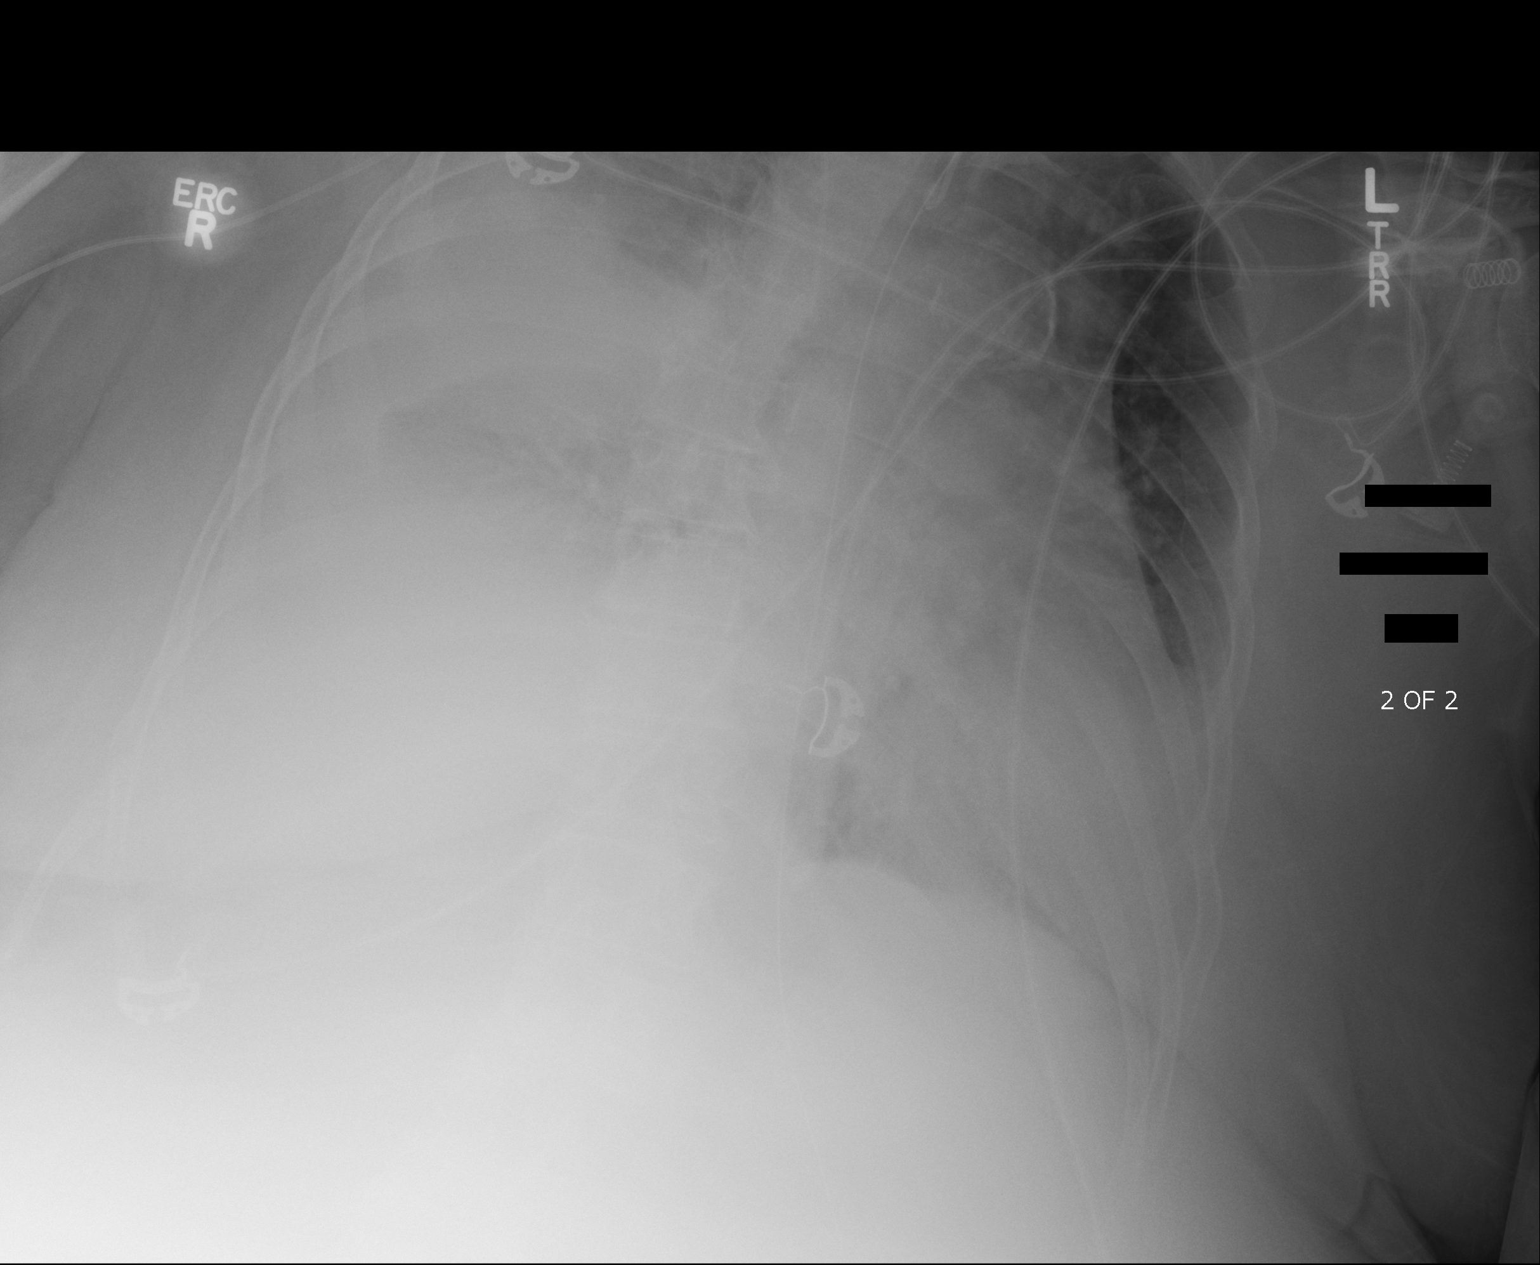

[2 of 2 positions shown; findings below may reference images not displayed]

FINDINGS: ET tube tip is above the carina. There is a nasogastric tube in
place. Right arm PICC line tip is in the SVC. Loculated right
pleural effusion is unchanged from previous exam. Cardiac
enlargement and aortic atherosclerosis again noted.
IMPRESSION: 1. No change and large loculated right pleural effusion.
2. Cardiac enlargement and aortic atherosclerosis.

## 2016-10-06 IMAGING — CR DG CHEST 1V PORT
1 series · 1 of 1 positions shown · non-contrast
Comparison: 06/19/2015 and earlier, including CT chest 06/06/2015.

CLINICAL DATA: Ventilator dependent respiratory failure. Hypoxia.
Followup right pleural effusion and postobstructive
atelectasis/pneumonia in the right lower lobe and right middle lobe.

EXAM:
PORTABLE CHEST 1 VIEW

[portable]
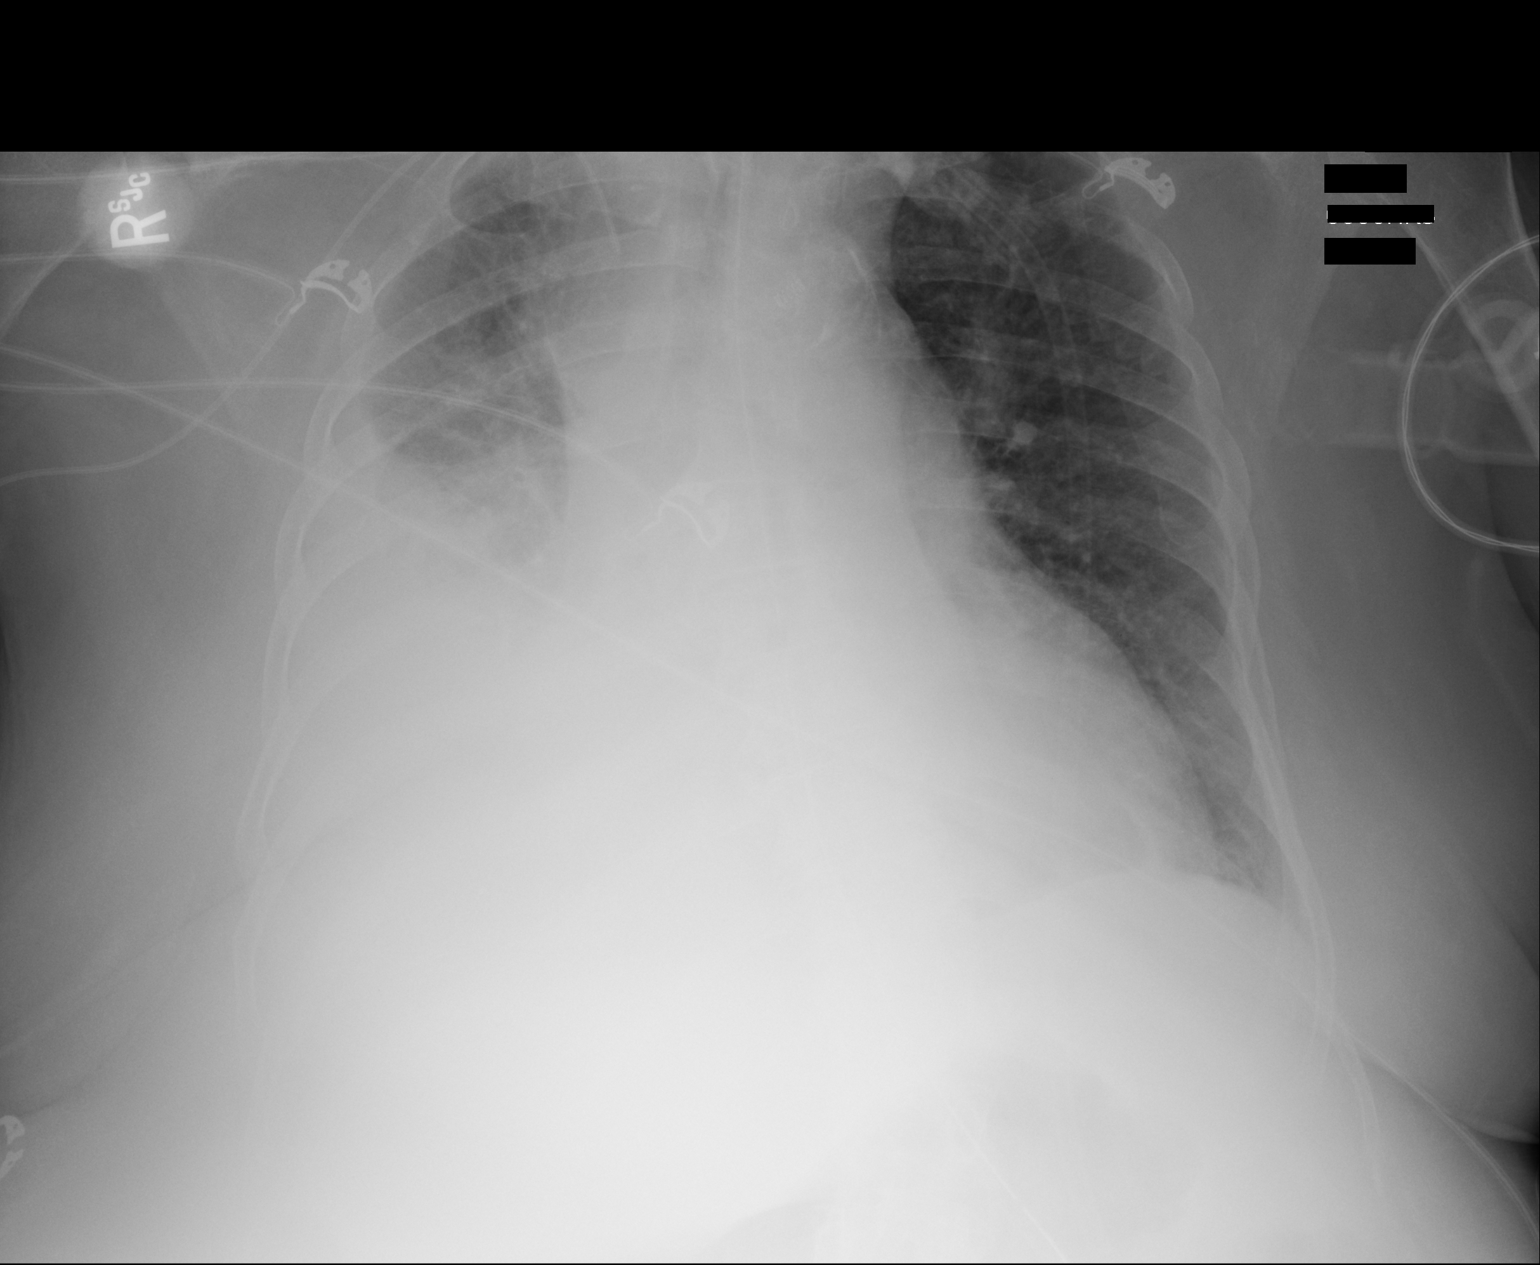

[1 of 1 positions shown; findings below may reference images not displayed]

FINDINGS: Endotracheal tube tip in satisfactory position projecting
approximately 6 cm above the carina. Right arm PICC tip projects at
the junction of the right innominate vein and SVC, unchanged.
Nasogastric tube courses below the diaphragm into the stomach.

Since the 5558 hr examination yesterday, no change in the dense
airspace consolidation in the right middle lobe and right lower lobe
and the large right pleural effusion. Prominent bronchovascular
markings in the left lung, unchanged, with improvement in the
pulmonary venous hypertension. No new pulmonary parenchymal
abnormalities.
IMPRESSION: 1. Support apparatus satisfactory.
2. No change since yesterday in the dense atelectasis/pneumonia
involving the right middle lobe and right lower lobe.
3. No change in the large right pleural effusion.
4. No new abnormalities.

## 2016-10-28 IMAGING — CR DG CHEST 1V PORT
1 series · 2 of 2 positions shown · non-contrast
Comparison: 07/06/2015

CLINICAL DATA: Shortness of Breath

EXAM:
PORTABLE CHEST - 1 VIEW

[Series 1: ap · 0.17mm/px · 2 of 2 slices shown]
[im 1/2]
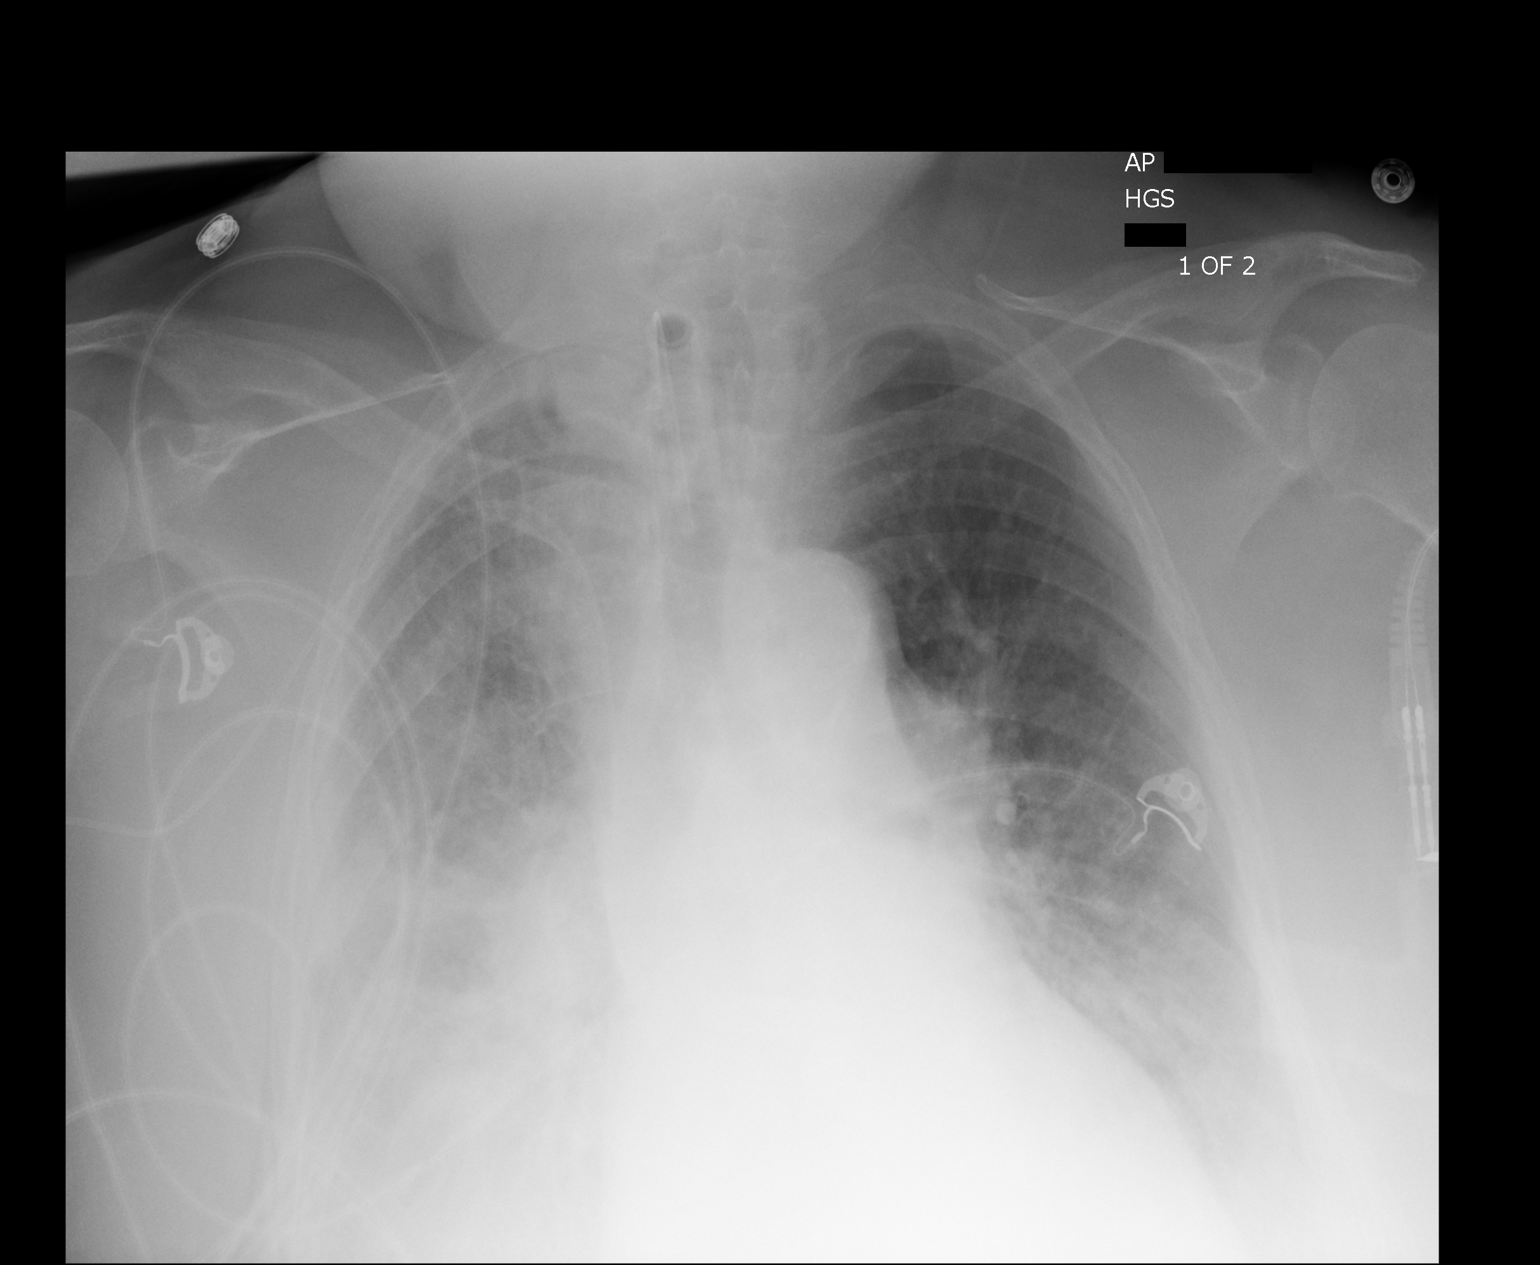
[im 2/2]
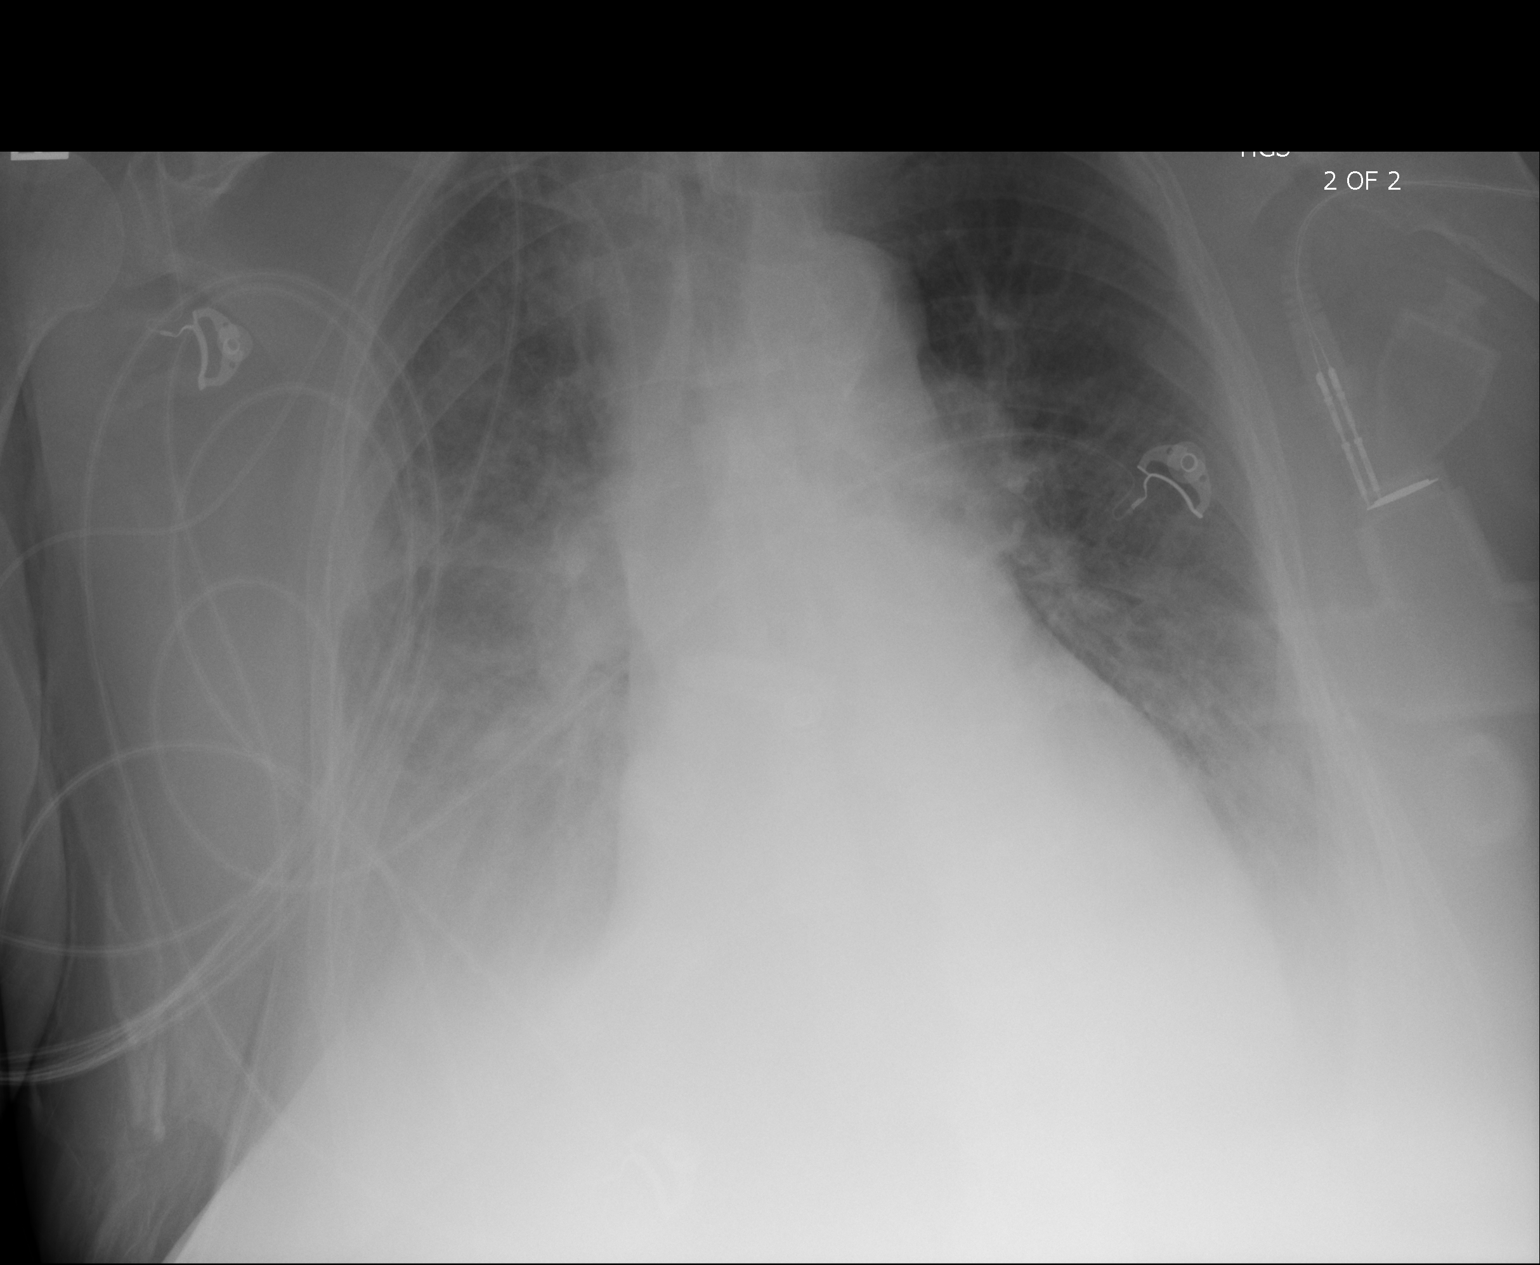

[2 of 2 positions shown; findings below may reference images not displayed]

FINDINGS: Right-sided PICC line and tracheostomy tube are again seen and
stable. Increasing density is noted in the bases bilaterally
consistent with increasing effusions. Patchy infiltrate is again
seen in the right upper lobe. Increasing right basilar infiltrate is
noted as well.
IMPRESSION: Increasing effusions and right basilar infiltrate.
